# Patient Record
Sex: Female | Born: 1990 | Race: Black or African American | Hispanic: No | Marital: Single
Health system: Midwestern US, Community
[De-identification: ages and names within clinical notes are randomized; demographics above are authoritative.]

## PROBLEM LIST (undated history)

## (undated) ENCOUNTER — Ambulatory Visit (HOSPITAL_COMMUNITY): Payer: Self-pay | Source: Home / Self Care

## (undated) DIAGNOSIS — A749 Chlamydial infection, unspecified: Secondary | ICD-10-CM

## (undated) DIAGNOSIS — A549 Gonococcal infection, unspecified: Secondary | ICD-10-CM

## (undated) DIAGNOSIS — N7093 Salpingitis and oophoritis, unspecified: Secondary | ICD-10-CM

## (undated) DIAGNOSIS — Z6831 Body mass index (BMI) 31.0-31.9, adult: Secondary | ICD-10-CM

## (undated) HISTORY — PX: INDUCED ABORTION: SHX677

## (undated) HISTORY — DX: Body mass index (BMI) 31.0-31.9, adult: Z68.31

## (undated) HISTORY — PX: TONSILLECTOMY: SUR1361

---
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
## 2012-01-01 NOTE — ED Provider Notes (Signed)
Medical screening examination/treatment/procedure(s) were performed by non-physician practitioner and as supervising physician I was immediately available for consultation/collaboration.  Normal vital signs. No acute abdomen. No purulent vaginal discharge. Will return if worse  Donnetta Hutching, MD 01/01/12 1537

## 2012-01-25 ENCOUNTER — Emergency Department (HOSPITAL_COMMUNITY)
Admission: EM | Admit: 2012-01-25 | Discharge: 2012-01-26 | Disposition: A | Discharge status: Home / Self Care | Payer: Self-pay | Attending: Emergency Medicine | Admitting: Emergency Medicine

## 2012-01-25 ENCOUNTER — Encounter (HOSPITAL_COMMUNITY): Payer: Self-pay | Admitting: Emergency Medicine

## 2012-01-25 DIAGNOSIS — M545 Low back pain: Secondary | ICD-10-CM

## 2012-01-25 DIAGNOSIS — M549 Dorsalgia, unspecified: Secondary | ICD-10-CM | POA: Insufficient documentation

## 2012-01-25 DIAGNOSIS — G8929 Other chronic pain: Secondary | ICD-10-CM | POA: Insufficient documentation

## 2012-01-25 NOTE — ED Notes (Addendum)
Patient complaining of MS pain; patient states that she is out of her pain medications (ran out three days ago).  Complaining of back pain; patient ambulatory to triage.  Patient states that she does not have a doctor's appointment until August.  Patient states that there is a chance she may be pregnant.

## 2012-01-26 ENCOUNTER — Encounter (HOSPITAL_COMMUNITY): Payer: Self-pay | Admitting: *Deleted

## 2012-01-26 MED ORDER — NORGESTIM-ETH ESTRAD TRIPHASIC 0.18/0.215/0.25 MG-25 MCG PO TABS: 1.0000 | ORAL_TABLET | Freq: Every day | ORAL | Status: DC

## 2012-01-26 MED ORDER — CYCLOBENZAPRINE HCL 10 MG PO TABS: 10.0000 mg | ORAL_TABLET | Freq: Two times a day (BID) | ORAL | Status: AC | PRN

## 2012-01-26 MED ORDER — TRAMADOL HCL 50 MG PO TABS: 50.0000 mg | ORAL_TABLET | Freq: Four times a day (QID) | ORAL | Status: AC | PRN

## 2012-01-26 NOTE — ED Provider Notes (Signed)
Medical screening examination/treatment/procedure(s) were performed by non-physician practitioner and as supervising physician I was immediately available for consultation/collaboration.   Dajah Fischman, MD 01/26/12 0413 

## 2012-01-26 NOTE — ED Provider Notes (Signed)
History     CSN: [id number]  Arrival date & time 01/25/12  2315   First MD Initiated Contact with Patient 01/26/12 0100      Chief Complaint  Patient presents with  . Back Pain    (Consider location/radiation/quality/duration/timing/severity/associated sxs/prior treatment) HPI Comments: Patient here with acute onset of chronic lower back pain - states that she has a history of sciolosis and states that she has chronic pain related to this - denies any injury to exacerbate this - reports pain worse with movement - denies weakness, numbness, tingling, fever, chills, dysuria, hematuria.  She states she would also like to get on birth control  Patient is a 21 y.o. female presenting with back pain. The history is provided by the patient. No language interpreter was used.  Back Pain  This is a recurrent problem. The current episode started more than 2 days ago. The problem occurs constantly. The problem has not changed since onset.The pain is associated with no known injury. The pain is present in the lumbar spine. The quality of the pain is described as stabbing and shooting. The pain does not radiate. The pain is at a severity of 5/10. The pain is moderate. The symptoms are aggravated by bending, twisting and certain positions. The pain is the same all the time. Stiffness is present all day. Pertinent negatives include no chest pain, no fever, no numbness, no weight loss, no headaches, no abdominal pain, no abdominal swelling, no bowel incontinence, no perianal numbness, no bladder incontinence, no dysuria, no pelvic pain, no leg pain, no paresthesias, no paresis, no tingling and no weakness. She has tried nothing for the symptoms. The treatment provided no relief.    Past Medical History  Diagnosis Date  . Asthma   . Asthma   . Scoliosis   . Multiple sclerosis     Past Surgical History  Procedure Date  . No past surgeries   . Induced abortion     Family History  Problem Relation Age  of Onset  . Diabetes Mother   . Hypertension Mother   . Diabetes Father   . Hypertension Father   . Diabetes Sister   . Hypertension Sister     History  Substance Use Topics  . Smoking status: Never Smoker   . Smokeless tobacco: Never Used  . Alcohol Use: No    OB History    Grav Para Term Preterm Abortions TAB SAB Ect Mult Living   0 0 0 0 0 0 0 0 0 0       Review of Systems  Constitutional: Negative for fever and weight loss.  Cardiovascular: Negative for chest pain.  Gastrointestinal: Negative for abdominal pain and bowel incontinence.  Genitourinary: Negative for bladder incontinence, dysuria and pelvic pain.  Musculoskeletal: Positive for back pain.  Neurological: Negative for tingling, weakness, numbness, headaches and paresthesias.  All other systems reviewed and are negative.    Allergies  Fish allergy and Ibuprofen  Home Medications  No current outpatient prescriptions on file.  BP 107/69  Pulse 75  Temp(Src) 98 F (36.7 C) (Oral)  Resp 18  SpO2 98%  LMP 01/20/2012  Physical Exam  Nursing note and vitals reviewed. Constitutional: She is oriented to person, place, and time. She appears well-developed and well-nourished. No distress.  HENT:  Head: Normocephalic and atraumatic.  Right Ear: External ear normal.  Left Ear: External ear normal.  Nose: Nose normal.  Mouth/Throat: Oropharynx is clear and moist. No oropharyngeal exudate.  Eyes: Conjunctivae  are normal. Pupils are equal, round, and reactive to light. No scleral icterus.  Neck: Normal range of motion. Neck supple.  Cardiovascular: Normal rate, regular rhythm and normal heart sounds.  Exam reveals no gallop and no friction rub.   No murmur heard. Pulmonary/Chest: Effort normal and breath sounds normal. No respiratory distress. She has no wheezes. She has no rales. She exhibits no tenderness.  Abdominal: Soft. Bowel sounds are normal. She exhibits no distension. There is no tenderness.    Musculoskeletal:       Lumbar back: She exhibits tenderness and bony tenderness. She exhibits normal range of motion, no swelling, no edema, no deformity and no laceration.  Lymphadenopathy:    She has no cervical adenopathy.  Neurological: She is alert and oriented to person, place, and time. She has normal reflexes. No cranial nerve deficit. She exhibits normal muscle tone. Coordination normal.  Skin: Skin is warm and dry. No rash noted. No erythema. No pallor.  Psychiatric: She has a normal mood and affect. Her behavior is normal. Judgment and thought content normal.    ED Course  Procedures (including critical care time)   Labs Reviewed  POCT PREGNANCY, URINE   No results found.   Low back pain   MDM  Patient with a history of low back pain presents with worsening of pain - she is also requesting birth control pills as well - states that she is not pregnant - also initially reports that she has a history of MS which she now denies - normal neurological exam without red flags - will give short course of pain medication, and muscle relaxation.        Izola Price [place], [place] 01/26/12 [phone]

## 2012-01-26 NOTE — Discharge Instructions (Signed)
Back Exercises   Back exercises help treat and prevent back injuries. The goal of back exercises is to increase the strength of your abdominal and back muscles and the flexibility of your back. These exercises should be started when you no longer have back pain. Back exercises include:   Pelvic Tilt. Lie on your back with your knees bent. Tilt your pelvis until the lower part of your back is against the floor. Hold this position 5 to 10 sec and repeat 5 to 10 times.   Knee to Chest. Pull first 1 knee up against your chest and hold for 20 to 30 seconds, repeat this with the other knee, and then both knees. This may be done with the other leg straight or bent, whichever feels better.   Sit-Ups or Curl-Ups. Bend your knees 90 degrees. Start with tilting your pelvis, and do a partial, slow sit-up, lifting your trunk only 30 to 45 degrees off the floor. Take at least 2 to 3 seconds for each sit-up. Do not do sit-ups with your knees out straight. If partial sit-ups are difficult, simply do the above but with only tightening your abdominal muscles and holding it as directed.   Hip-Lift. Lie on your back with your knees flexed 90 degrees. Push down with your feet and shoulders as you raise your hips a couple inches off the floor; hold for 10 seconds, repeat 5 to 10 times.   Back arches. Lie on your stomach, propping yourself up on bent elbows. Slowly press on your hands, causing an arch in your low back. Repeat 3 to 5 times. Any initial stiffness and discomfort should lessen with repetition over time.   Shoulder-Lifts. Lie face down with arms beside your body. Keep hips and torso pressed to floor as you slowly lift your head and shoulders off the floor.   Do not overdo your exercises, especially in the beginning. Exercises may cause you some mild back discomfort which lasts for a few minutes; however, if the pain is more severe, or lasts for more than 15 minutes, do not continue exercises until you see your caregiver.  Improvement with exercise therapy for back problems is slow.   See your caregivers for assistance with developing a proper back exercise program.   Document Released: 09/16/2004 Document Revised: 07/29/2011 Document Reviewed: 08/09/2005   ExitCare® Patient Information ©2012 [employer].     Back Pain, Adult   Low back pain is very common. About 1 in 5 people have back pain. The cause of low back pain is rarely dangerous. The pain often gets better over time. About half of people with a sudden onset of back pain feel better in just 2 weeks. About 8 in 10 people feel better by 6 weeks.   CAUSES   Some common causes of back pain include:   Strain of the muscles or ligaments supporting the spine.   Wear and tear (degeneration) of the spinal discs.   Arthritis.   Direct injury to the back.   DIAGNOSIS   Most of the time, the direct cause of low back pain is not known. However, back pain can be treated effectively even when the exact cause of the pain is unknown. Answering your caregiver's questions about your overall health and symptoms is one of the most accurate ways to make sure the cause of your pain is not dangerous. If your caregiver needs more information, he or she may order lab work or imaging tests (X-rays or MRIs). However, even   if imaging tests show changes in your back, this usually does not require surgery.   HOME CARE INSTRUCTIONS   For many people, back pain returns. Since low back pain is rarely dangerous, it is often a condition that people can learn to manage on their own.   Remain active. It is stressful on the back to sit or stand in one place. Do not sit, drive, or stand in one place for more than 30 minutes at a time. Take short walks on level surfaces as soon as pain allows. Try to increase the length of time you walk each day.   Do not stay in bed. Resting more than 1 or 2 days can delay your recovery.   Do not avoid exercise or work. Your body is made to move. It is not dangerous to be active,  even though your back may hurt. Your back will likely heal faster if you return to being active before your pain is gone.   Pay attention to your body when you bend and lift. Many people have less discomfort when lifting if they bend their knees, keep the load close to their bodies, and avoid twisting. Often, the most comfortable positions are those that put less stress on your recovering back.   Find a comfortable position to sleep. Use a firm mattress and lie on your side with your knees slightly bent. If you lie on your back, put a pillow under your knees.   Only take over-the-counter or prescription medicines as directed by your caregiver. Over-the-counter medicines to reduce pain and inflammation are often the most helpful. Your caregiver may prescribe muscle relaxant drugs. These medicines help dull your pain so you can more quickly return to your normal activities and healthy exercise.   Put ice on the injured area.   Put ice in a plastic bag.   Place a towel between your skin and the bag.   Leave the ice on for 15 to 20 minutes, 3 to 4 times a day for the first 2 to 3 days. After that, ice and heat may be alternated to reduce pain and spasms.   Ask your caregiver about trying back exercises and gentle massage. This may be of some benefit.   Avoid feeling anxious or stressed. Stress increases muscle tension and can worsen back pain. It is important to recognize when you are anxious or stressed and learn ways to manage it. Exercise is a great option.   SEEK MEDICAL CARE IF:   You have pain that is not relieved with rest or medicine.   You have pain that does not improve in 1 week.   You have new symptoms.   You are generally not feeling well.   SEEK IMMEDIATE MEDICAL CARE IF:   You have pain that radiates from your back into your legs.   You develop new bowel or bladder control problems.   You have unusual weakness or numbness in your arms or legs.   You develop nausea or vomiting.   You develop abdominal  pain.   You feel faint.   Document Released: 08/09/2005 Document Revised: 07/29/2011 Document Reviewed: 12/28/2010   ExitCare® Patient Information ©2012 [employer].

## 2012-02-08 ENCOUNTER — Encounter (HOSPITAL_BASED_OUTPATIENT_CLINIC_OR_DEPARTMENT_OTHER): Payer: Self-pay | Admitting: Emergency Medicine

## 2012-02-08 ENCOUNTER — Emergency Department (HOSPITAL_BASED_OUTPATIENT_CLINIC_OR_DEPARTMENT_OTHER): Payer: No Typology Code available for payment source

## 2012-02-08 ENCOUNTER — Emergency Department (HOSPITAL_BASED_OUTPATIENT_CLINIC_OR_DEPARTMENT_OTHER)
Admission: EM | Admit: 2012-02-08 | Discharge: 2012-02-08 | Disposition: A | Discharge status: Home / Self Care | Payer: Medicaid Other | Attending: Emergency Medicine | Admitting: Emergency Medicine

## 2012-02-08 DIAGNOSIS — G35 Multiple sclerosis: Secondary | ICD-10-CM | POA: Insufficient documentation

## 2012-02-08 DIAGNOSIS — Z85841 Personal history of malignant neoplasm of brain: Secondary | ICD-10-CM | POA: Insufficient documentation

## 2012-02-08 DIAGNOSIS — R109 Unspecified abdominal pain: Secondary | ICD-10-CM | POA: Insufficient documentation

## 2012-02-08 DIAGNOSIS — M4120 Other idiopathic scoliosis, site unspecified: Secondary | ICD-10-CM | POA: Insufficient documentation

## 2012-02-08 DIAGNOSIS — IMO0002 Reserved for concepts with insufficient information to code with codable children: Secondary | ICD-10-CM
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
## 2012-02-08 NOTE — ED Notes (Signed)
Patient transported to X-ray 

## 2012-02-09 NOTE — ED Provider Notes (Signed)
Medical screening examination/treatment/procedure(s) were performed by non-physician practitioner and as supervising physician I was immediately available for consultation/collaboration.   Naren Benally, MD 02/09/12 2319 

## 2013-12-11 LAB — URINALYSIS W/ RFLX MICROSCOPIC
Blood: NEGATIVE
Glucose: NEGATIVE mg/dL
Leukocyte Esterase: NEGATIVE
Nitrites: NEGATIVE
Protein: 30 mg/dL — AB
Specific gravity: >1.03 — ABNORMAL HIGH (ref 1.003–1.030)
Urobilinogen: 0.2 EU/dL (ref 0.2–1.0)
pH (UA): 6 (ref 5.0–8.0)

## 2013-12-11 LAB — URINE MICROSCOPIC ONLY
RBC: 0 /hpf (ref 0–5)
WBC: 20 /hpf (ref 0–4)

## 2013-12-11 LAB — HCG QL SERUM: HCG, Ql.: NEGATIVE

## 2013-12-11 MED ORDER — TRIMETHOPRIM-SULFAMETHOXAZOLE 160 MG-800 MG TAB
160-800 mg | ORAL_TABLET | Freq: Two times a day (BID) | ORAL | Status: AC
Start: 2013-12-11 — End: 2013-12-18

## 2013-12-11 NOTE — ED Notes (Signed)
POC pregnancy test negative.

## 2013-12-11 NOTE — ED Notes (Signed)
I have reviewed discharge instructions with the patient.  The patient verbalized understanding.Patient armband removed and shredded

## 2013-12-11 NOTE — ED Notes (Signed)
Pt reports here for pregnancy test. C/o of nausea, lower abdominal pain.

## 2013-12-11 NOTE — ED Provider Notes (Signed)
HPI Comments: Karen Shaffer is a 23 y.o. Female with no PMHx who presents to the ED c/o abdominal pain. Pt states she has been having intermittent, lower abdominal pain for about a month. Pt notes also some nausea and fatigue. Pt has not had a period in two months and states she is concerned for pregnancy but did not take him pregnancy test. Pt denies any vomiting, dysuria, vaginal bleeding, vaginal discharge. No other complaints expressed at this time.    Patient is a 23 y.o. female presenting with abdominal pain. The history is provided by the patient. History limited by: no communication barrier.   Abdominal Pain   This is a new problem. The current episode started more than 2 days ago. The problem occurs constantly. The problem has not changed since onset.Associated symptoms include nausea. Pertinent negatives include no fever, no diarrhea, no vomiting, no dysuria, no frequency, no headaches, no arthralgias, no myalgias and no chest pain.        History reviewed. No pertinent past medical history.     History reviewed. No pertinent past surgical history.      History reviewed. No pertinent family history.     History     Social History   ??? Marital Status: SINGLE     Spouse Name: N/A     Number of Children: N/A   ??? Years of Education: N/A     Occupational History   ??? Not on file.     Social History Main Topics   ??? Smoking status: Not on file   ??? Smokeless tobacco: Not on file   ??? Alcohol Use: Not on file   ??? Drug Use: Not on file   ??? Sexual Activity: Not on file     Other Topics Concern   ??? Not on file     Social History Narrative   ??? No narrative on file        ALLERGIES: Motrin      Review of Systems   Constitutional: Positive for fatigue. Negative for fever, chills and diaphoresis.   HENT: Negative.  Negative for congestion, sore throat and trouble swallowing.    Eyes: Negative.  Negative for photophobia, pain and redness.   Respiratory: Negative.  Negative for cough, chest tightness, shortness of breath and  wheezing.    Cardiovascular: Negative.  Negative for chest pain and palpitations.   Gastrointestinal: Positive for nausea and abdominal pain. Negative for vomiting, diarrhea and blood in stool.   Endocrine: Negative.    Genitourinary: Positive for menstrual problem. Negative for dysuria, urgency, frequency, vaginal bleeding, vaginal discharge and difficulty urinating.   Musculoskeletal: Negative.  Negative for myalgias, arthralgias, neck pain and neck stiffness.   Skin: Negative.    Allergic/Immunologic: Negative.    Neurological: Negative.  Negative for dizziness, tremors, seizures, syncope, speech difficulty, light-headedness and headaches.   Psychiatric/Behavioral: Negative.  Negative for confusion. The patient is not nervous/anxious.    All other systems reviewed and are negative.      Filed Vitals:    12/11/13 1401   BP: 118/82   Pulse: 91   Temp: 98.3 ??F (36.8 ??C)   Resp: 18   Height: 5\' 2"  (1.575 m)   Weight: 65.772 kg (145 lb)   SpO2: 99%            Physical Exam   Constitutional: She is oriented to person, place, and time. She appears well-developed and well-nourished. No distress.   HENT:   Head: Normocephalic and atraumatic.  Eyes: EOM are normal. Pupils are equal, round, and reactive to light.   Neck: Normal range of motion. Neck supple.   Cardiovascular: Normal rate, regular rhythm and normal heart sounds.    Pulmonary/Chest: Effort normal and breath sounds normal. No respiratory distress. She has no wheezes. She has no rales.   Abdominal: Soft. Bowel sounds are normal. There is no tenderness.   Genitourinary:   NE   Musculoskeletal: Normal range of motion.   Neurological: She is alert and oriented to person, place, and time.   Skin: Skin is warm and dry.   Psychiatric: She has a normal mood and affect.   Nursing note and vitals reviewed.       MDM  Number of Diagnoses or Management Options  Diagnosis management comments: MDM:  Pt states she has no pain, no vaginal DC.  She is only interested in  findings out if she is pregnant or not.         Amount and/or Complexity of Data Reviewed  Clinical lab tests: reviewed and ordered    Risk of Complications, Morbidity, and/or Mortality  Presenting problems: minimal  Diagnostic procedures: minimal  Management options: minimal        Procedures    -------------------------------------------------------------------------------------------------------------------     EKG INTERPRETATIONS:  none    RADIOLOGY RESULTS:   No orders to display       LABORATORY RESULTS:  No results found for this or any previous visit (from the past 12 hour(s)).    ED Objective Order Summary:     Orders Placed This Encounter   ??? CHLAMYDIA/GC AMPLIFIED     Standing Status: Standing      Number of Occurrences: 1      Standing Expiration Date:      Order Specific Question:  Sample type     Answer:  Swab [241]     Order Specific Question:  Specimen source     Answer:  Vagina [280]   ??? WET PREP     Standing Status: Standing      Number of Occurrences: 1      Standing Expiration Date:    ??? URINALYSIS W/ RFLX MICROSCOPIC     Standing Status: Standing      Number of Occurrences: 1      Standing Expiration Date:    ??? PELVIC EXAM SET UP     Standing Status: Standing      Number of Occurrences: 1      Standing Expiration Date:    ??? POC URINE PREGNANCY TEST     Standing Status: Standing      Number of Occurrences: 1      Standing Expiration Date:        CONSULTATIONS:      PROGRESS NOTES:  2:00 PM:  Dr. Llana Aliment, DO answered the patient's questions regarding treatment.    DISPOSITION:  ED DIAGNOSIS & DISPOSITION INFORMATION  Diagnosis: No diagnosis found.      Disposition:   Discharged to Home.      Follow-up Information    None          Patient's Medications    No medications on file       SCRIBE ATTESTATION STATEMENT:  Documented by: Gino Garrabrant Qualia scribing for and in the presence of Llana Aliment, DO.    PROVIDER ATTESTATION STATEMENT:  I personally performed the services described in the  documentation, reviewed the documentation, as recorded by the scribe in my presence, and it  accurately and completely records my words and actions Llana Alimentlarence Clarke, DO.   Discharged to Home.       Diagnosis:   1. Pregnancy test negative    2. Urinary tract infection without hematuria, site unspecified          Disposition:  Discharged to home.      Follow-up Information    Follow up With Details Comments Contact Info    Aerian Dondra SpryK Joyner, NP Call today to arrange follow up appointment [address]  [place] [id number]  [phone]            Current Discharge Medication List      START taking these medications    Details   trimethoprim-sulfamethoxazole (SEPTRA DS) 160-800 mg per tablet Take 1 Tab by mouth two (2) times a day for 7 days.  Qty: 14 Tab, Refills: 0

## 2013-12-14 LAB — CULTURE, URINE
Culture result:: 30000
Culture result:: 60000
Culture: 60000

## 2018-01-23 ENCOUNTER — Encounter (HOSPITAL_COMMUNITY): Payer: Self-pay | Admitting: Emergency Medicine

## 2018-01-23 ENCOUNTER — Emergency Department (HOSPITAL_COMMUNITY)
Admission: EM | Admit: 2018-01-23 | Discharge: 2018-01-23 | Disposition: A | Discharge status: Home / Self Care | Payer: Self-pay | Attending: Emergency Medicine | Admitting: Emergency Medicine

## 2018-01-23 ENCOUNTER — Other Ambulatory Visit: Payer: Self-pay

## 2018-01-23 DIAGNOSIS — Z5321 Procedure and treatment not carried out due to patient leaving prior to being seen by health care provider: Secondary | ICD-10-CM | POA: Insufficient documentation

## 2018-01-23 DIAGNOSIS — R109 Unspecified abdominal pain: Secondary | ICD-10-CM | POA: Insufficient documentation

## 2018-01-23 LAB — CBC WITH DIFFERENTIAL/PLATELET
Abs Immature Granulocytes: 0.1 10*3/uL (ref 0.0–0.1)
BASOS ABS: 0.1 10*3/uL (ref 0.0–0.1)
Basophils Relative: 1 %
EOS PCT: 1 %
Eosinophils Absolute: 0.1 10*3/uL (ref 0.0–0.7)
HEMATOCRIT: 38.7 % (ref 36.0–46.0)
HEMOGLOBIN: 12.6 g/dL (ref 12.0–15.0)
IMMATURE GRANULOCYTES: 0 %
LYMPHS ABS: 3.4 10*3/uL (ref 0.7–4.0)
LYMPHS PCT: 25 %
MCH: 30.1 pg (ref 26.0–34.0)
MCHC: 32.6 g/dL (ref 30.0–36.0)
MCV: 92.6 fL (ref 78.0–100.0)
Monocytes Absolute: 1 10*3/uL (ref 0.1–1.0)
Monocytes Relative: 7 %
Neutro Abs: 9.2 10*3/uL — ABNORMAL HIGH (ref 1.7–7.7)
Neutrophils Relative %: 66 %
Platelets: 353 10*3/uL (ref 150–400)
RBC: 4.18 MIL/uL (ref 3.87–5.11)
RDW: 14.5 % (ref 11.5–15.5)
WBC: 13.8 10*3/uL — AB (ref 4.0–10.5)

## 2018-01-23 LAB — URINALYSIS, ROUTINE W REFLEX MICROSCOPIC
GLUCOSE, UA: NEGATIVE mg/dL
HGB URINE DIPSTICK: NEGATIVE
KETONES UR: NEGATIVE mg/dL
NITRITE: NEGATIVE
PROTEIN: 30 mg/dL — AB
Specific Gravity, Urine: 1.03 (ref 1.005–1.030)
pH: 5 (ref 5.0–8.0)

## 2018-01-23 LAB — COMPREHENSIVE METABOLIC PANEL
ALT: 9 U/L — ABNORMAL LOW (ref 14–54)
ANION GAP: 10 (ref 5–15)
AST: 15 U/L (ref 15–41)
Albumin: 3.8 g/dL (ref 3.5–5.0)
Alkaline Phosphatase: 54 U/L (ref 38–126)
BILIRUBIN TOTAL: 0.5 mg/dL (ref 0.3–1.2)
BUN: 9 mg/dL (ref 6–20)
CHLORIDE: 105 mmol/L (ref 101–111)
CO2: 27 mmol/L (ref 22–32)
Calcium: 9.1 mg/dL (ref 8.9–10.3)
Creatinine, Ser: 1.01 mg/dL — ABNORMAL HIGH (ref 0.44–1.00)
Glucose, Bld: 100 mg/dL — ABNORMAL HIGH (ref 65–99)
POTASSIUM: 3.6 mmol/L (ref 3.5–5.1)
Sodium: 142 mmol/L (ref 135–145)
TOTAL PROTEIN: 7.5 g/dL (ref 6.5–8.1)

## 2018-01-23 LAB — POC URINE PREG, ED: Preg Test, Ur: NEGATIVE

## 2018-01-23 MED ORDER — OXYCODONE HCL 5 MG PO TABS
5.0000 mg | ORAL_TABLET | Freq: Once | ORAL | Status: AC
  Administered 2018-01-23: 5 mg via ORAL
  Filled 2018-01-23: qty 1

## 2018-01-23 NOTE — ED Notes (Signed)
Called out to pt for vitals check, no response

## 2018-01-23 NOTE — ED Provider Notes (Addendum)
Patient placed in Quick Look pathway, seen and evaluated   Chief Complaint: RLQ abdominal pain  HPI:   Patient presents the ED with right lower quadrant abdominal pain that has been worsening for 1 month.  She states she was evaluated by [facility] last week and told she had an abscess of her right fallopian tube.  She states they recommended she be admitted for 1 week, however she left AMA because she did not have the time for admission.  She states the past 3 days pain has been worsening with nausea and vomiting.  Denies vaginal bleeding or discharge, urinary symptoms, fevers or chills.  LMP 2 months ago, requesting a pregnancy test. Per chart review, [facility] diagnosed with right tubo-ovarian abscess via U/S and CT.  Per documentation, she was recommended for admission though left AMA. There is no documentation of any further ED visits. When questioned about this, patient states she was indeed evaluated in April and then states she reported to [facility] last week for reevaluation and did not stay.  She states she was called today and recommended to report to the ED as soon as possible.  ROS: +abdominal pain, +N/V, -F/C Physical Exam:   Gen: No distress, well-appearing.  Neuro: Awake and Alert  Skin: Warm    Focused Exam: Afebrile.  Abdomen is soft.  Right lower quadrant tenderness on exam, no guarding.   Initiation of care has begun. The patient has been counseled on the process, plan, and necessity for staying for the completion/evaluation, and the remainder of the medical screening examination    Robinson, [place] N, PA-C 01/23/18 1336    Robinson, [place] N, PA-C 01/23/18 1341    Arby BarrettePfeiffer, Marcy, MD 02/01/18 [phone]

## 2018-01-23 NOTE — ED Notes (Signed)
Pt called in lobby for rounding, no response. Second call. RN informed.

## 2018-01-23 NOTE — ED Triage Notes (Addendum)
Patient to ED c/o R sided abdominal pain x 1 month, N/V, constipation x 3 days. [facility] and reports they wanted to admit her after finding a "6cm abscess on her R tube." Patient states she couldn't stay because she has to work. She reports she went to [facility] again last week, but nothing was done. Requesting a pregnancy test, LMP about 2 months ago.

## 2018-01-23 NOTE — ED Notes (Signed)
Third call in lobby, no response.

## 2018-01-28 ENCOUNTER — Inpatient Hospital Stay (HOSPITAL_COMMUNITY)
Admission: AD | Admit: 2018-01-28 | Discharge: 2018-01-28 | Discharge status: Against Medical Advice | Payer: Self-pay | Source: Ambulatory Visit | Attending: Obstetrics & Gynecology | Admitting: Obstetrics & Gynecology

## 2018-01-28 NOTE — MAU Note (Signed)
Pt presents with c/o abdominal pain that began on June 3rd.  Pt reports was [facility] @ [facility] and diagnosed with an abccess on her right tube in April.    [facility] because abdominal pain reoccurred.

## 2018-01-28 NOTE — Progress Notes (Signed)
Pt left AMA without being seen by AP or signing AMA form.   During triage, pt's significant other knocked on door to inform pt that their ride is on the way.  Pt stated "I mind as well not be seen, my rides on the way, I'm leaving.  I've been here for 3 hours and [facility]".  RN instructed she was going to be evaluated once triage completed.  Pt proceeded to walk out of triage area.

## 2018-01-28 NOTE — Progress Notes (Signed)
Pt called to seen in triage to be evaluated.  Not currently in lobby.

## 2018-01-28 NOTE — Progress Notes (Signed)
Pt called again to be evaluated, not seen in lobby.  Registration states pt's significant other came into lobby and asked if pt was ready for evaluation, informed that pt must  had been called but wasn't in lobby @ time.

## 2018-02-17 ENCOUNTER — Encounter (HOSPITAL_COMMUNITY): Payer: Self-pay | Admitting: Emergency Medicine

## 2018-02-17 ENCOUNTER — Emergency Department (HOSPITAL_COMMUNITY)
Admission: EM | Admit: 2018-02-17 | Discharge: 2018-02-17 | Disposition: A | Discharge status: Home / Self Care | Payer: Medicaid Other | Attending: Emergency Medicine | Admitting: Emergency Medicine

## 2018-02-17 DIAGNOSIS — Y999 Unspecified external cause status: Secondary | ICD-10-CM | POA: Insufficient documentation

## 2018-02-17 DIAGNOSIS — R21 Rash and other nonspecific skin eruption: Secondary | ICD-10-CM

## 2018-02-17 DIAGNOSIS — X12XXXA Contact with other hot fluids, initial encounter: Secondary | ICD-10-CM | POA: Insufficient documentation

## 2018-02-17 DIAGNOSIS — Y92000 Kitchen of unspecified non-institutional (private) residence as  the place of occurrence of the external cause: Secondary | ICD-10-CM | POA: Insufficient documentation

## 2018-02-17 DIAGNOSIS — T22111A Burn of first degree of right forearm, initial encounter: Secondary | ICD-10-CM

## 2018-02-17 DIAGNOSIS — Z85841 Personal history of malignant neoplasm of brain: Secondary | ICD-10-CM | POA: Insufficient documentation

## 2018-02-17 DIAGNOSIS — T310 Burns involving less than 10% of body surface: Secondary | ICD-10-CM | POA: Insufficient documentation

## 2018-02-17 DIAGNOSIS — Y93G3 Activity, cooking and baking: Secondary | ICD-10-CM | POA: Insufficient documentation

## 2018-02-17 DIAGNOSIS — J45909 Unspecified asthma, uncomplicated: Secondary | ICD-10-CM | POA: Insufficient documentation

## 2018-02-17 MED ORDER — PREDNISONE 10 MG (21) PO TBPK: ORAL_TABLET | Freq: Every day | ORAL | 0 refills | Status: DC

## 2018-02-17 MED ORDER — HYDROXYZINE HCL 25 MG PO TABS: 25.0000 mg | ORAL_TABLET | Freq: Four times a day (QID) | ORAL | 0 refills | Status: DC

## 2018-02-17 MED ORDER — SILVER SULFADIAZINE 1 % EX CREA
TOPICAL_CREAM | Freq: Once | CUTANEOUS | Status: AC
  Administered 2018-02-17: 17:00:00 via TOPICAL
  Filled 2018-02-17: qty 85
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
## 2018-02-17 NOTE — ED Notes (Signed)
Pt verbalized understanding discharge instructions and denies any further needs or questions at this time. VS stable, ambulatory and steady gait.   

## 2018-03-06 ENCOUNTER — Emergency Department (HOSPITAL_COMMUNITY): Payer: Self-pay

## 2018-03-06 ENCOUNTER — Emergency Department (HOSPITAL_COMMUNITY)
Admission: EM | Admit: 2018-03-06 | Discharge: 2018-03-06 | Disposition: A | Discharge status: Home / Self Care | Payer: Self-pay | Attending: Emergency Medicine | Admitting: Emergency Medicine

## 2018-03-06 ENCOUNTER — Encounter (HOSPITAL_COMMUNITY): Payer: Self-pay | Admitting: Emergency Medicine

## 2018-03-06 DIAGNOSIS — A549 Gonococcal infection, unspecified: Secondary | ICD-10-CM | POA: Insufficient documentation

## 2018-03-06 DIAGNOSIS — R1084 Generalized abdominal pain: Secondary | ICD-10-CM | POA: Insufficient documentation

## 2018-03-06 DIAGNOSIS — A5486 Gonococcal sepsis: Secondary | ICD-10-CM

## 2018-03-06 DIAGNOSIS — R112 Nausea with vomiting, unspecified: Secondary | ICD-10-CM | POA: Insufficient documentation

## 2018-03-06 DIAGNOSIS — Z85841 Personal history of malignant neoplasm of brain: Secondary | ICD-10-CM | POA: Insufficient documentation

## 2018-03-06 DIAGNOSIS — N7093 Salpingitis and oophoritis, unspecified: Secondary | ICD-10-CM | POA: Insufficient documentation

## 2018-03-06 DIAGNOSIS — J45909 Unspecified asthma, uncomplicated: Secondary | ICD-10-CM | POA: Insufficient documentation

## 2018-03-06 LAB — URINALYSIS, ROUTINE W REFLEX MICROSCOPIC
Bacteria, UA: NONE SEEN
Bilirubin Urine: NEGATIVE
GLUCOSE, UA: NEGATIVE mg/dL
Hgb urine dipstick: NEGATIVE
KETONES UR: 20 mg/dL — AB
Nitrite: NEGATIVE
PROTEIN: 30 mg/dL — AB
Specific Gravity, Urine: 1.03 (ref 1.005–1.030)
pH: 5 (ref 5.0–8.0)

## 2018-03-06 LAB — WET PREP, GENITAL
SPERM: NONE SEEN
Trich, Wet Prep: NONE SEEN
Yeast Wet Prep HPF POC: NONE SEEN

## 2018-03-06 LAB — COMPREHENSIVE METABOLIC PANEL
ALK PHOS: 60 U/L (ref 38–126)
ALT: 12 U/L (ref 0–44)
ANION GAP: 8 (ref 5–15)
AST: 20 U/L (ref 15–41)
Albumin: 4.1 g/dL (ref 3.5–5.0)
BUN: 9 mg/dL (ref 6–20)
CALCIUM: 9.5 mg/dL (ref 8.9–10.3)
CHLORIDE: 109 mmol/L (ref 98–111)
CO2: 26 mmol/L (ref 22–32)
Creatinine, Ser: 0.78 mg/dL (ref 0.44–1.00)
GFR calc non Af Amer: >60 mL/min (ref >60)
Glucose, Bld: 103 mg/dL — ABNORMAL HIGH (ref 70–99)
POTASSIUM: 3.6 mmol/L (ref 3.5–5.1)
SODIUM: 143 mmol/L (ref 135–145)
Total Bilirubin: 0.8 mg/dL (ref 0.3–1.2)
Total Protein: 8.4 g/dL — ABNORMAL HIGH (ref 6.5–8.1)

## 2018-03-06 LAB — CBC
HCT: 38.5 % (ref 36.0–46.0)
HEMOGLOBIN: 12.8 g/dL (ref 12.0–15.0)
MCH: 30.6 pg (ref 26.0–34.0)
MCHC: 33.2 g/dL (ref 30.0–36.0)
MCV: 92.1 fL (ref 78.0–100.0)
Platelets: 333 10*3/uL (ref 150–400)
RBC: 4.18 MIL/uL (ref 3.87–5.11)
RDW: 15.3 % (ref 11.5–15.5)
WBC: 12.8 10*3/uL — ABNORMAL HIGH (ref 4.0–10.5)

## 2018-03-06 LAB — I-STAT BETA HCG BLOOD, ED (MC, WL, AP ONLY): I-stat hCG, quantitative: <5 m[IU]/mL (ref <5)

## 2018-03-06 LAB — I-STAT CG4 LACTIC ACID, ED: Lactic Acid, Venous: 0.53 mmol/L (ref 0.5–1.9)

## 2018-03-06 LAB — LIPASE, BLOOD: LIPASE: 24 U/L (ref 11–51)

## 2018-03-06 MED ORDER — SODIUM CHLORIDE 0.9 % IV BOLUS
1000.0000 mL | Freq: Once | INTRAVENOUS | Status: AC
  Administered 2018-03-06: 1000 mL via INTRAVENOUS

## 2018-03-06 MED ORDER — NICOTINE 14 MG/24HR TD PT24: 14.0000 mg | MEDICATED_PATCH | Freq: Once | TRANSDERMAL | Status: DC

## 2018-03-06 MED ORDER — SODIUM CHLORIDE 0.9 % IV SOLN
2.0000 g | Freq: Once | INTRAVENOUS | Status: AC
  Administered 2018-03-06: 2 g via INTRAVENOUS
  Filled 2018-03-06: qty 2

## 2018-03-06 MED ORDER — MORPHINE SULFATE (PF) 4 MG/ML IV SOLN: 4.0000 mg | Freq: Once | INTRAVENOUS | Status: DC

## 2018-03-06 MED ORDER — MORPHINE SULFATE (PF) 4 MG/ML IV SOLN
4.0000 mg | Freq: Once | INTRAVENOUS | Status: AC
Start: 2018-03-06 — End: 2018-03-06
  Administered 2018-03-06: 4 mg via INTRAVENOUS
  Filled 2018-03-06: qty 1

## 2018-03-06 MED ORDER — SODIUM CHLORIDE 0.9 % IV SOLN
100.0000 mg | Freq: Once | INTRAVENOUS | Status: DC
  Filled 2018-03-06: qty 100

## 2018-03-06 MED ORDER — ONDANSETRON HCL 4 MG/2ML IJ SOLN
4.0000 mg | Freq: Once | INTRAMUSCULAR | Status: AC
  Administered 2018-03-06: 4 mg via INTRAVENOUS
  Filled 2018-03-06: qty 2

## 2018-03-06 NOTE — Discharge Instructions (Signed)
Your work-up today showed evidence of tubo-ovarian abscess as well as disseminated gonorrhea infection causing your rash.  This is the cause of your pain and symptoms.  We highly recommended admission and evaluation by the OB/GYN team as they might need to do a surgery however despite this you want to be discharged.  You are leaving AGAINST MEDICAL ADVICE.  We strongly discussed the chances that you could become more sick, infertile, or even die.  You understood these risks.  As we discussed, when your child care is taken care of, please go directly [facility] for evaluation.  If any symptoms change or worsen please call 911.

## 2018-03-06 NOTE — ED Notes (Signed)
Pt reports she is unable to void at this time. Pt updated on plan of care

## 2018-03-06 NOTE — ED Notes (Signed)
Patient left AMA prior to paperwork being printed. Dr. Rush Landmarkegeler aware. Patient stated to this nurse that she plans to [facility] to [facility], per Dr. Sharee Holsteregelers recommendation since she refuses to stay.

## 2018-03-06 NOTE — ED Notes (Signed)
Dr. Rush Landmarkegeler has repeatedly tried to convince patient to stay and has talked to the patient about the risks of leaving. Patient agreed to wait for antibiotics to finish and then requests to be disconnected so that she can leave.

## 2018-03-06 NOTE — ED Notes (Signed)
Pelvic cart at bedside. 

## 2018-03-06 NOTE — ED Provider Notes (Signed)
[facility]-EMERGENCY DEPT Provider Note   CSN: [id number] Arrival date & time: 03/06/18  1307     History   Chief Complaint Chief Complaint  Patient presents with  . Abdominal Pain    HPI Paula Lucas is a 27 y.o. female.  The history is provided by the patient and a significant other. No language interpreter was used.  Abdominal Pain   This is a recurrent problem. The current episode started more than 1 week ago. The problem occurs constantly. The problem has been rapidly worsening. The pain is associated with an unknown factor. The pain is located in the RLQ and suprapubic region. The quality of the pain is aching, sharp and cramping. The pain is at a severity of 9/10. The pain is severe. Associated symptoms include fever (chills), diarrhea, nausea, vomiting and constipation. Pertinent negatives include dysuria, frequency, hematuria and headaches. The symptoms are aggravated by palpation (walking). Nothing relieves the symptoms. Past workup includes CT scan and ultrasound (in april showing TOA).    Past Medical History:  Diagnosis Date  . Asthma   . Asthma   . Cancer of brain (HCC)   . Multiple sclerosis (HCC)   . Scoliosis     Patient Active Problem List   Diagnosis Date Noted  . Left ovarian cyst 04/16/2011    Past Surgical History:  Procedure Laterality Date  . INDUCED ABORTION    . NO PAST SURGERIES    . TONSILLECTOMY       OB History    Gravida  0   Para  0   Term  0   Preterm  0   AB  0   Living  0     SAB  0   TAB  0   Ectopic  0   Multiple  0   Live Births               Home Medications    Prior to Admission medications   Medication Sig Start Date End Date Taking? Authorizing Provider  hydrOXYzine (ATARAX/VISTARIL) 25 MG tablet Take 1 tablet (25 mg total) by mouth every 6 (six) hours. 02/17/18   Khatri, Hina, PA-C  predniSONE (STERAPRED UNI-PAK 21 TAB) 10 MG (21) TBPK tablet Take by mouth daily. Take 6  tabs by mouth daily  for 2 days, then 5 tabs for 2 days, then 4 tabs for 2 days, then 3 tabs for 2 days, 2 tabs for 2 days, then 1 tab by mouth daily for 2 days 02/17/18   Dietrich Pates, PA-C    Family History Family History  Problem Relation Age of Onset  . Diabetes Mother   . Hypertension Mother   . Diabetes Father   . Hypertension Father   . Diabetes Sister   . Hypertension Sister     Social History Social History   Tobacco Use  . Smoking status: Never Smoker  . Smokeless tobacco: Never Used  Substance Use Topics  . Alcohol use: No  . Drug use: No     Allergies   Fish allergy; Ibuprofen [ibuprofen]; and Mushroom extract complex   Review of Systems Review of Systems  Constitutional: Positive for chills, fatigue and fever (chills). Negative for diaphoresis.  HENT: Negative for congestion.   Respiratory: Negative for cough, chest tightness, shortness of breath and wheezing.   Cardiovascular: Negative for chest pain and palpitations.  Gastrointestinal: Positive for abdominal pain, constipation, diarrhea, nausea and vomiting. Negative for abdominal distention.  Genitourinary: Negative  for decreased urine volume, dysuria, flank pain, frequency, hematuria, pelvic pain, vaginal bleeding, vaginal discharge and vaginal pain.  Musculoskeletal: Positive for back pain. Negative for neck pain and neck stiffness.  Skin: Positive for rash. Negative for wound.  Neurological: Negative for light-headedness, numbness and headaches.  Psychiatric/Behavioral: Negative for agitation and confusion.  All other systems reviewed and are negative.    Physical Exam Updated Vital Signs BP 124/85 (BP Location: Left Arm)   Pulse (!) 107   Temp 98.8 F (37.1 C) (Oral)   Resp 18   Ht 5\' 1"  (1.549 m)   LMP 01/04/2018   SpO2 100%   BMI 32.12 kg/m   Physical Exam  Constitutional: She appears well-developed and well-nourished.  Non-toxic appearance. She does not appear ill. No distress.  HENT:   Head: Normocephalic and atraumatic.  Mouth/Throat: Oropharynx is clear and moist. No oropharyngeal exudate.  Eyes: Conjunctivae are normal.  Neck: Neck supple.  Cardiovascular: Regular rhythm and normal heart sounds. Tachycardia present.  No murmur heard. Pulmonary/Chest: Effort normal and breath sounds normal. No respiratory distress.  Abdominal: Soft. Normal appearance and bowel sounds are normal. There is tenderness in the right lower quadrant and suprapubic area. There is no rigidity, no rebound, no guarding and no CVA tenderness.    Genitourinary: Cervix exhibits motion tenderness and discharge. Right adnexum displays tenderness and fullness. Left adnexum displays tenderness. There is erythema and tenderness in the vagina. Vaginal discharge found.  Musculoskeletal: She exhibits no edema.  Neurological: She is alert.  Skin: Skin is warm and dry. Capillary refill takes less than 2 seconds. Rash noted. Rash is vesicular. No erythema.  Patient has disseminated rash on body that does appear to be vesicular and darkened places concerning for disseminated gonococcal infection.  Psychiatric: She has a normal mood and affect.  Nursing note and vitals reviewed.            ED Treatments / Results  Labs (all labs ordered are listed, but only abnormal results are displayed) Labs Reviewed  WET PREP, GENITAL - Abnormal; Notable for the following components:      Result Value   Clue Cells Wet Prep HPF POC PRESENT (*)    WBC, Wet Prep HPF POC MANY (*)    All other components within normal limits  COMPREHENSIVE METABOLIC PANEL - Abnormal; Notable for the following components:   Glucose, Bld 103 (*)    Total Protein 8.4 (*)    All other components within normal limits  CBC - Abnormal; Notable for the following components:   WBC 12.8 (*)    All other components within normal limits  URINALYSIS, ROUTINE W REFLEX MICROSCOPIC - Abnormal; Notable for the following components:   Color,  Urine AMBER (*)    APPearance CLOUDY (*)    Ketones, ur 20 (*)    Protein, ur 30 (*)    Leukocytes, UA MODERATE (*)    All other components within normal limits  CULTURE, BLOOD (ROUTINE X 2)  CULTURE, BLOOD (ROUTINE X 2)  LIPASE, BLOOD  I-STAT BETA HCG BLOOD, ED ([facility], [facility], [facility] ONLY)  I-STAT CG4 LACTIC ACID, ED  WET PREP  (BD AFFIRM) ([place])  GC/CHLAMYDIA PROBE AMP ([place]) NOT AT [facility]    EKG None  Radiology US Transvaginal Non-ob  Result Date: 03/06/2018 CLINICAL DATA:  Initial evaluation for acute right lower quadrant and pelvic pain. Recent untreated TOA. EXAM: TRANSABDOMINAL AND TRANSVAGINAL ULTRASOUND OF PELVIS DOPPLER ULTRASOUND OF OVARIES TECHNIQUE: Both transabdominal and transvaginal  ultrasound examinations of the pelvis were performed. Transabdominal technique was performed for global imaging of the pelvis including uterus, ovaries, adnexal regions, and pelvic cul-de-sac. It was necessary to proceed with endovaginal exam following the transabdominal exam to visualize the uterus, endometrium, and ovaries. Color and duplex Doppler ultrasound was utilized to evaluate blood flow to the ovaries. COMPARISON:  None. FINDINGS: Uterus Measurements: 8.1 x 3.2 x 4.1 cm. 8 x 5 x 6 mm subserosal fibroid present at the anterior uterine body. Endometrium Thickness: 6 mm.  No focal abnormality visualized. Right ovary Measurements: 7.6 x 6.4 x 6.2 cm. Complex cystic lesion measuring 5.5 x 4.2 x 4.8 cm with internal lace-like architecture, most characteristic of a hemorrhagic cyst. Adjacent heterogeneous isoechoic lesion within the right ovary measures 2.0 x 2.4 x 2.0 cm, indeterminate, but could reflect a complex cyst with internal proteinaceous material and/or hemorrhage. Left ovary Measurements: 4.0 x 2.0 x 2.6 cm. Normal appearance/no adnexal mass. Pulsed Doppler evaluation of both ovaries demonstrates normal low-resistance arterial and venous waveforms. Other findings Complex fluid present  within the left adnexal region with scattered internal septations and/or debris. IMPRESSION: 1. 5.5 cm complex right adnexal cystic lesion, most characteristic for a hemorrhagic cyst. Given size, a short interval follow-up ultrasound in 6-12 weeks is recommended to ensure resolution. 2. Complex free fluid with internal septations and/or debris within the left adnexa, suspected to reflect sequelae of recent infection/TOA given provided history. Correlation with symptomatology and laboratory values recommended, as persistent infection/PID would be difficult to exclude, and could be considered in the correct clinical setting. 3. No evidence for ovarian torsion. 4. 8 mm uterine fibroid. Electronically Signed   By: Rise Mu M.D.   On: 03/06/2018 20:40   US Pelvis Complete  Result Date: 03/06/2018 CLINICAL DATA:  Initial evaluation for acute right lower quadrant and pelvic pain. Recent untreated TOA. EXAM: TRANSABDOMINAL AND TRANSVAGINAL ULTRASOUND OF PELVIS DOPPLER ULTRASOUND OF OVARIES TECHNIQUE: Both transabdominal and transvaginal ultrasound examinations of the pelvis were performed. Transabdominal technique was performed for global imaging of the pelvis including uterus, ovaries, adnexal regions, and pelvic cul-de-sac. It was necessary to proceed with endovaginal exam following the transabdominal exam to visualize the uterus, endometrium, and ovaries. Color and duplex Doppler ultrasound was utilized to evaluate blood flow to the ovaries. COMPARISON:  None. FINDINGS: Uterus Measurements: 8.1 x 3.2 x 4.1 cm. 8 x 5 x 6 mm subserosal fibroid present at the anterior uterine body. Endometrium Thickness: 6 mm.  No focal abnormality visualized. Right ovary Measurements: 7.6 x 6.4 x 6.2 cm. Complex cystic lesion measuring 5.5 x 4.2 x 4.8 cm with internal lace-like architecture, most characteristic of a hemorrhagic cyst. Adjacent heterogeneous isoechoic lesion within the right ovary measures 2.0 x 2.4 x 2.0 cm,  indeterminate, but could reflect a complex cyst with internal proteinaceous material and/or hemorrhage. Left ovary Measurements: 4.0 x 2.0 x 2.6 cm. Normal appearance/no adnexal mass. Pulsed Doppler evaluation of both ovaries demonstrates normal low-resistance arterial and venous waveforms. Other findings Complex fluid present within the left adnexal region with scattered internal septations and/or debris. IMPRESSION: 1. 5.5 cm complex right adnexal cystic lesion, most characteristic for a hemorrhagic cyst. Given size, a short interval follow-up ultrasound in 6-12 weeks is recommended to ensure resolution. 2. Complex free fluid with internal septations and/or debris within the left adnexa, suspected to reflect sequelae of recent infection/TOA given provided history. Correlation with symptomatology and laboratory values recommended, as persistent infection/PID would be difficult to exclude, and could be  considered in the correct clinical setting. 3. No evidence for ovarian torsion. 4. 8 mm uterine fibroid. Electronically Signed   By: Rise MuBenjamin  McClintock M.D.   On: 03/06/2018 20:40   [place] Art/ven Flow Abd Pelv Doppler  Result Date: 03/06/2018 CLINICAL DATA:  Initial evaluation for acute right lower quadrant and pelvic pain. Recent untreated TOA. EXAM: TRANSABDOMINAL AND TRANSVAGINAL ULTRASOUND OF PELVIS DOPPLER ULTRASOUND OF OVARIES TECHNIQUE: Both transabdominal and transvaginal ultrasound examinations of the pelvis were performed. Transabdominal technique was performed for global imaging of the pelvis including uterus, ovaries, adnexal regions, and pelvic cul-de-sac. It was necessary to proceed with endovaginal exam following the transabdominal exam to visualize the uterus, endometrium, and ovaries. Color and duplex Doppler ultrasound was utilized to evaluate blood flow to the ovaries. COMPARISON:  None. FINDINGS: Uterus Measurements: 8.1 x 3.2 x 4.1 cm. 8 x 5 x 6 mm subserosal fibroid present at the anterior  uterine body. Endometrium Thickness: 6 mm.  No focal abnormality visualized. Right ovary Measurements: 7.6 x 6.4 x 6.2 cm. Complex cystic lesion measuring 5.5 x 4.2 x 4.8 cm with internal lace-like architecture, most characteristic of a hemorrhagic cyst. Adjacent heterogeneous isoechoic lesion within the right ovary measures 2.0 x 2.4 x 2.0 cm, indeterminate, but could reflect a complex cyst with internal proteinaceous material and/or hemorrhage. Left ovary Measurements: 4.0 x 2.0 x 2.6 cm. Normal appearance/no adnexal mass. Pulsed Doppler evaluation of both ovaries demonstrates normal low-resistance arterial and venous waveforms. Other findings Complex fluid present within the left adnexal region with scattered internal septations and/or debris. IMPRESSION: 1. 5.5 cm complex right adnexal cystic lesion, most characteristic for a hemorrhagic cyst. Given size, a short interval follow-up ultrasound in 6-12 weeks is recommended to ensure resolution. 2. Complex free fluid with internal septations and/or debris within the left adnexa, suspected to reflect sequelae of recent infection/TOA given provided history. Correlation with symptomatology and laboratory values recommended, as persistent infection/PID would be difficult to exclude, and could be considered in the correct clinical setting. 3. No evidence for ovarian torsion. 4. 8 mm uterine fibroid. Electronically Signed   By: Rise MuBenjamin  McClintock M.D.   On: 03/06/2018 20:40    Procedures Procedures (including critical care time)  Medications Ordered in ED Medications  doxycycline (VIBRAMYCIN) 100 mg in sodium chloride 0.9 % 250 mL IVPB (has no administration in time range)  morphine 4 MG/ML injection 4 mg (has no administration in time range)  nicotine (NICODERM CQ - dosed in mg/24 hours) patch 14 mg (has no administration in time range)  morphine 4 MG/ML injection 4 mg (4 mg Intravenous Given 03/06/18 1832)  ondansetron (ZOFRAN) injection 4 mg (4 mg  Intravenous Given 03/06/18 1832)  sodium chloride 0.9 % bolus 1,000 mL (0 mLs Intravenous Stopped 03/06/18 1933)  cefOXitin (MEFOXIN) 2 g in sodium chloride 0.9 % 100 mL IVPB (0 g Intravenous Stopped 03/06/18 2217)     Initial Impression / Assessment and Plan / ED Course  I have reviewed the triage vital signs and the nursing notes.  Pertinent labs & imaging results that were available during my care of the patient were reviewed by me and considered in my medical decision making (see chart for details).     Alethia BertholdJavonna S Lichtenberg is a 27 y.o. female with a past medical history significant for tubo-ovarian abscess in April of this year which was not fully treated, documentation of prior MS (which patient deines), brain tumor s/p surgery as a child, and asthma who presents to the  emergency department with right lower quadrant abdominal pain, nausea, and vomiting.  Patient reports that back in April she was having right lower abdominal pain and was found to have a right tubo-ovarian abscess that was partially 6 cm in size.  Patient was offered admission but left AGAINST MEDICAL ADVICE as she was diagnosed in [place] and could not stay at that time.  She says that she received the dose of antibiotics through an IV in the emergency department and then left without admission.  She says that her pain was intermittent since then but over the last 5 days it is worsened.  She reports it is now constant and goes across her lower abdomen primarily in the right lower quadrant.  She reports diffuse pain.  She also reports that over the last 2 weeks she is developed a rash over her body that does appear to now be on her right palm.  She reports that she was seen 2 weeks ago when the rash began and was started on prednisone and Atarax.  She reports the rash has been improving in some areas but is worsened in other areas.  Patient is family concerned about her abdominal pain which is associated with nausea and vomiting.  She  has not been eating over the last few days.  She also reports intermittent constipation and diarrhea.  She describes the pain is up to a 9 out of 10 severity and is cramping, swelling, and sharp pain.  She reports it is worse when she ambulates and to the pain radiates towards her back.  She denies any vaginal discharge or vaginal bleeding.  She denies any chest pain, shortness breath or cough.  She does report subjective chills.  Patient will have pelvic exam performed for further evaluation.  Next para clinically I am concerned about worsening TOA or spreading abscess in her abdomen.  Chart review shows that patient was positive for gonococcal infection and leaving AMA she did not fully treated.  I am concerned her rashes disseminated gonococcal infection.  See photos above for the rash.  After pelvic exam, she will have pelvic ultrasound and likely following CT scan to evaluate.  Pregnancy test was negative.  Patient was given pain medicine, nausea medicine, and fluids and she will remain n.p.o. Initially.  Anticipate admission.  6:59 PM Pelvic exam was performed and patient was found to have copious white discharge.  Patient had tenderness of the cervix and bilateral adnexa, right worse than left.  There did appear to be fullness on the right side.  Pelvic ultrasound be ordered to further evaluate.  Lactic acid was not elevated and urinalysis did not show nitrites or bacteria.  Metabolic panel was overall reassuring and CBC shows mild leukocytosis.    I am concerned about continued TOA with disseminated gonorrhea.  Anticipate admission after ultrasound.  9:38 PM Pelvic ultrasound showed evidence of concerning fluid pocket on both the right and the left.  Clinically I am concerned about TOA having spread bilaterally.  Pharmacy was called who recommended cefoxitin and IV doxycycline.  This was ordered.  OB/GYN was called and recommended transfer and direct admission to [facility] for  further management.  They will likely start with IV antibiotics and consider drainage if it is not improved tomorrow.  While awaiting transfer, patient was found to be smoking a cigarette in the exam room.  Patient was advised this was unacceptable and agreed to nicotine patch.  Patient to continue to raise concern about admission due  to children being at home however patient was advised that she could die if discharged again AGAINST MEDICAL ADVICE.  Patient agreed to at least go to [facility] and see the OB/GYN team for admission.    Patient will be transferred.  10:15 PM Patient reports that she is leaving his medical advice and will not be transferred over to see the OB/GYN team and get admitted.  She reports that she will go to the [facility] tomorrow after she gets her childcare taken care of to be admitted.  Patient will be leaving AGAINST MEDICAL ADVICE.  Dr. Earlene Plater with OB/GYN team was called and made aware that patient is not being admitted.  Dr. Earlene Plater will make the women's team aware so that when the patient comes in the morning and will help expedite her management and admission.   Final Clinical Impressions(s) / ED Diagnoses   Final diagnoses:  TOA (tubo-ovarian abscess)  DGI (disseminated gonococcal infection) (HCC)  Generalized abdominal pain  Non-intractable vomiting with nausea, unspecified vomiting type    ED Discharge Orders    None      Clinical Impression: 1. TOA (tubo-ovarian abscess)   2. DGI (disseminated gonococcal infection) (HCC)   3. Generalized abdominal pain   4. Non-intractable vomiting with nausea, unspecified vomiting type     Disposition: AGAINST MEDICAL ADVICE    Discharge Medication List as of 03/06/2018 10:25 PM      Follow Up: THE [facility] MATERNITY ADMISSIONS [address] [id number] mc [address] [place] [address] [phone] Go today      Tegeler, Canary Brim, MD 03/07/18  [phone]

## 2018-03-06 NOTE — ED Notes (Addendum)
Patient's IV keeps occluding, due to bending arm. Patient states she wants to leave due to not having childcare overnight.

## 2018-03-06 NOTE — ED Notes (Signed)
MD at bedside. 

## 2018-03-06 NOTE — ED Triage Notes (Signed)
Per [facility] pt from home for RLQ abd pain 5 days, with n/v but not at present. Pt had hx of problem with fallopian tube and had to be on antibiotics.

## 2018-03-06 NOTE — ED Notes (Signed)
Dr Rush Landmarkegeler walked into room and patient was smoking in her room. Patient is no longer smoking.

## 2018-03-07 LAB — GC/CHLAMYDIA PROBE AMP (~~LOC~~) NOT AT ARMC
CHLAMYDIA, DNA PROBE: POSITIVE — AB
Neisseria Gonorrhea: POSITIVE — AB

## 2018-04-01 ENCOUNTER — Emergency Department (HOSPITAL_COMMUNITY): Payer: Self-pay

## 2018-04-01 ENCOUNTER — Inpatient Hospital Stay (HOSPITAL_COMMUNITY)
Admission: EM | Admit: 2018-04-01 | Discharge: 2018-04-07 | DRG: 749 | Disposition: A | Discharge status: Home / Self Care | Payer: Self-pay | Attending: Family Medicine | Admitting: Family Medicine

## 2018-04-01 ENCOUNTER — Other Ambulatory Visit: Payer: Self-pay

## 2018-04-01 ENCOUNTER — Encounter (HOSPITAL_COMMUNITY): Payer: Self-pay

## 2018-04-01 DIAGNOSIS — B9689 Other specified bacterial agents as the cause of diseases classified elsewhere: Secondary | ICD-10-CM | POA: Diagnosis present

## 2018-04-01 DIAGNOSIS — A549 Gonococcal infection, unspecified: Secondary | ICD-10-CM | POA: Diagnosis present

## 2018-04-01 DIAGNOSIS — N7011 Chronic salpingitis: Secondary | ICD-10-CM | POA: Diagnosis present

## 2018-04-01 DIAGNOSIS — F17210 Nicotine dependence, cigarettes, uncomplicated: Secondary | ICD-10-CM | POA: Diagnosis present

## 2018-04-01 DIAGNOSIS — N730 Acute parametritis and pelvic cellulitis: Secondary | ICD-10-CM

## 2018-04-01 DIAGNOSIS — N7092 Oophoritis, unspecified: Secondary | ICD-10-CM

## 2018-04-01 DIAGNOSIS — N83201 Unspecified ovarian cyst, right side: Secondary | ICD-10-CM | POA: Diagnosis present

## 2018-04-01 DIAGNOSIS — N83202 Unspecified ovarian cyst, left side: Secondary | ICD-10-CM | POA: Diagnosis present

## 2018-04-01 DIAGNOSIS — N760 Acute vaginitis: Secondary | ICD-10-CM | POA: Diagnosis present

## 2018-04-01 DIAGNOSIS — N7093 Salpingitis and oophoritis, unspecified: Principal | ICD-10-CM | POA: Diagnosis present

## 2018-04-01 LAB — CBC WITH DIFFERENTIAL/PLATELET
BASOS ABS: 0 10*3/uL (ref 0.0–0.1)
BASOS PCT: 0 %
EOS PCT: 0 %
Eosinophils Absolute: 0 10*3/uL (ref 0.0–0.7)
HCT: 38.8 % (ref 36.0–46.0)
Hemoglobin: 12.8 g/dL (ref 12.0–15.0)
Lymphocytes Relative: 18 %
Lymphs Abs: 2.2 10*3/uL (ref 0.7–4.0)
MCH: 30 pg (ref 26.0–34.0)
MCHC: 33 g/dL (ref 30.0–36.0)
MCV: 91.1 fL (ref 78.0–100.0)
MONO ABS: 0.3 10*3/uL (ref 0.1–1.0)
Monocytes Relative: 3 %
Neutro Abs: 9.6 10*3/uL — ABNORMAL HIGH (ref 1.7–7.7)
Neutrophils Relative %: 79 %
PLATELETS: 295 10*3/uL (ref 150–400)
RBC: 4.26 MIL/uL (ref 3.87–5.11)
RDW: 15 % (ref 11.5–15.5)
WBC: 12.1 10*3/uL — ABNORMAL HIGH (ref 4.0–10.5)

## 2018-04-01 LAB — URINALYSIS, ROUTINE W REFLEX MICROSCOPIC
Bilirubin Urine: NEGATIVE
Glucose, UA: NEGATIVE mg/dL
Hgb urine dipstick: NEGATIVE
Ketones, ur: 20 mg/dL — AB
NITRITE: NEGATIVE
Protein, ur: NEGATIVE mg/dL
SPECIFIC GRAVITY, URINE: 1.024 (ref 1.005–1.030)
pH: 5 (ref 5.0–8.0)

## 2018-04-01 LAB — I-STAT CG4 LACTIC ACID, ED: LACTIC ACID, VENOUS: 0.77 mmol/L (ref 0.5–1.9)

## 2018-04-01 LAB — COMPREHENSIVE METABOLIC PANEL
ALBUMIN: 3.9 g/dL (ref 3.5–5.0)
ALT: 11 U/L (ref 0–44)
ANION GAP: 7 (ref 5–15)
AST: 19 U/L (ref 15–41)
Alkaline Phosphatase: 49 U/L (ref 38–126)
BUN: 8 mg/dL (ref 6–20)
CHLORIDE: 112 mmol/L — AB (ref 98–111)
CO2: 21 mmol/L — ABNORMAL LOW (ref 22–32)
Calcium: 8.9 mg/dL (ref 8.9–10.3)
Creatinine, Ser: 0.8 mg/dL (ref 0.44–1.00)
GFR calc Af Amer: >60 mL/min (ref >60)
Glucose, Bld: 100 mg/dL — ABNORMAL HIGH (ref 70–99)
POTASSIUM: 4 mmol/L (ref 3.5–5.1)
Sodium: 140 mmol/L (ref 135–145)
Total Bilirubin: 0.5 mg/dL (ref 0.3–1.2)
Total Protein: 7.8 g/dL (ref 6.5–8.1)

## 2018-04-01 LAB — WET PREP, GENITAL
SPERM: NONE SEEN
TRICH WET PREP: NONE SEEN
Yeast Wet Prep HPF POC: NONE SEEN

## 2018-04-01 LAB — I-STAT BETA HCG BLOOD, ED (MC, WL, AP ONLY)

## 2018-04-01 MED ORDER — KETOROLAC TROMETHAMINE 30 MG/ML IJ SOLN
30.0000 mg | Freq: Four times a day (QID) | INTRAMUSCULAR | Status: DC | PRN
  Administered 2018-04-02: 30 mg via INTRAVENOUS
  Filled 2018-04-01: qty 1

## 2018-04-01 MED ORDER — LACTATED RINGERS IV SOLN
INTRAVENOUS | Status: DC
  Administered 2018-04-01 – 2018-04-04 (×6): via INTRAVENOUS

## 2018-04-01 MED ORDER — IOPAMIDOL (ISOVUE-300) INJECTION 61%
INTRAVENOUS | Status: AC
  Filled 2018-04-01: qty 100

## 2018-04-01 MED ORDER — SODIUM CHLORIDE 0.9 % IV SOLN
100.0000 mg | Freq: Once | INTRAVENOUS | Status: AC
  Administered 2018-04-01: 100 mg via INTRAVENOUS
  Filled 2018-04-01: qty 100

## 2018-04-01 MED ORDER — OXYCODONE HCL 5 MG PO TABS
10.0000 mg | ORAL_TABLET | ORAL | Status: DC | PRN
  Administered 2018-04-01 – 2018-04-03 (×5): 10 mg via ORAL
  Filled 2018-04-01 (×5): qty 2

## 2018-04-01 MED ORDER — CEFOXITIN SODIUM 2 G IV SOLR
2.0000 g | Freq: Once | INTRAVENOUS | Status: DC
  Filled 2018-04-01: qty 2

## 2018-04-01 MED ORDER — OXYCODONE HCL 5 MG PO TABS
5.0000 mg | ORAL_TABLET | ORAL | Status: DC | PRN
  Administered 2018-04-04 – 2018-04-07 (×2): 5 mg via ORAL
  Filled 2018-04-01: qty 1

## 2018-04-01 MED ORDER — MORPHINE SULFATE (PF) 4 MG/ML IV SOLN
4.0000 mg | Freq: Once | INTRAVENOUS | Status: AC
  Administered 2018-04-01: 4 mg via INTRAVENOUS
  Filled 2018-04-01: qty 1

## 2018-04-01 MED ORDER — SODIUM CHLORIDE 0.9 % IV BOLUS
1000.0000 mL | Freq: Once | INTRAVENOUS | Status: AC
  Administered 2018-04-01: 1000 mL via INTRAVENOUS

## 2018-04-01 MED ORDER — METRONIDAZOLE IN NACL 5-0.79 MG/ML-% IV SOLN
500.0000 mg | Freq: Two times a day (BID) | INTRAVENOUS | Status: DC
  Administered 2018-04-02 – 2018-04-07 (×11): 500 mg via INTRAVENOUS
  Filled 2018-04-01 (×14): qty 100

## 2018-04-01 MED ORDER — CEFOTETAN DISODIUM 2 G IJ SOLR
2.0000 g | Freq: Two times a day (BID) | INTRAMUSCULAR | Status: DC
Start: 2018-04-01 — End: 2018-04-07
  Administered 2018-04-02 – 2018-04-07 (×11): 2 g via INTRAVENOUS
  Filled 2018-04-01 (×13): qty 2

## 2018-04-01 MED ORDER — ACETAMINOPHEN 325 MG PO TABS: 650.0000 mg | ORAL_TABLET | Freq: Four times a day (QID) | ORAL | Status: DC | PRN

## 2018-04-01 MED ORDER — SODIUM CHLORIDE 0.9 % IV SOLN
100.0000 mg | Freq: Two times a day (BID) | INTRAVENOUS | Status: DC
  Administered 2018-04-02 – 2018-04-07 (×10): 100 mg via INTRAVENOUS
  Filled 2018-04-01 (×13): qty 100

## 2018-04-01 MED ORDER — IOPAMIDOL (ISOVUE-300) INJECTION 61%
100.0000 mL | Freq: Once | INTRAVENOUS | Status: AC | PRN
  Administered 2018-04-01: 100 mL via INTRAVENOUS

## 2018-04-01 MED ORDER — ONDANSETRON HCL 4 MG/2ML IJ SOLN
4.0000 mg | Freq: Four times a day (QID) | INTRAMUSCULAR | Status: DC | PRN
  Administered 2018-04-01: 4 mg via INTRAVENOUS
  Filled 2018-04-01: qty 2

## 2018-04-01 MED ORDER — ONDANSETRON HCL 4 MG/2ML IJ SOLN
4.0000 mg | Freq: Once | INTRAMUSCULAR | Status: AC
  Administered 2018-04-01: 4 mg via INTRAVENOUS
  Filled 2018-04-01: qty 2

## 2018-04-01 MED ORDER — ACETAMINOPHEN 650 MG RE SUPP: 650.0000 mg | Freq: Four times a day (QID) | RECTAL | Status: DC | PRN

## 2018-04-01 MED ORDER — HYDROCODONE-ACETAMINOPHEN 5-325 MG PO TABS: 1.0000 | ORAL_TABLET | ORAL | Status: DC | PRN

## 2018-04-01 MED ORDER — NICOTINE 7 MG/24HR TD PT24
7.0000 mg | MEDICATED_PATCH | Freq: Every day | TRANSDERMAL | Status: DC
  Filled 2018-04-01 (×8): qty 1

## 2018-04-01 NOTE — ED Notes (Signed)
[facility] Provider at bedside. 

## 2018-04-01 NOTE — H&P (Signed)
History and Physical  Paula BertholdJavonna S Kassa JYN:[id number] DOB: 23-Jul-1991 DOA: 04/01/2018  Referring physician: Daphine DeutscherSofia Caccavale, PA-C, ED provider PCP: Patient, No Pcp Per  Outpatient Specialists: none  Patient Coming From: home  Chief Complaint: fever, pelvic pain, nausea, vomiting  HPI: Paula Lucas is a 27 y.o. female with a history of asthma. She was diagnosed with TOA on right side on 7/15. She was offered admission, but patient left AMA. She returns today with fever, worsening abdominal pain. Pain is worsening, radiating up her back bilaterally. Associated symptoms of nausea and vomiting and is unable to tolerate PO.  Emergency Department Course: CT scan showed several cystic lesions arising from both ovaries, the largest is on the left and measurings 5.5 x4.3 cm. WBC 12.  Review of Systems:   Pt denies any diarrhea, constipation, abdominal pain, shortness of breath, dyspnea on exertion, orthopnea, cough, wheezing, palpitations, headache, vision changes, lightheadedness, dizziness, melena, rectal bleeding.  Review of systems are otherwise negative  Past Medical History:  Diagnosis Date  . Asthma   . Asthma   . Cancer of brain (HCC)   . Multiple sclerosis (HCC)   . Scoliosis    Past Surgical History:  Procedure Laterality Date  . INDUCED ABORTION    . NO PAST SURGERIES    . TONSILLECTOMY     Social History:  reports that she has never smoked. She has never used smokeless tobacco. She reports that she does not drink alcohol or use drugs. Patient lives at home  Allergies  Allergen Reactions  . Fish Allergy Anaphylaxis    Severe allergic reaction to all seafoods  . Ibuprofen [Ibuprofen] Anaphylaxis  . Mushroom Extract Complex Anaphylaxis  . Tylenol [Acetaminophen] Anaphylaxis    Family History  Problem Relation Age of Onset  . Diabetes Mother   . Hypertension Mother   . Diabetes Father   . Hypertension Father   . Diabetes Sister   . Hypertension Sister        Prior to Admission medications   Medication Sig Start Date End Date Taking? Authorizing Provider  naproxen (NAPROSYN) 375 MG tablet Take 750 mg by mouth as needed for mild pain.   Yes [provider]  hydrOXYzine (ATARAX/VISTARIL) 25 MG tablet Take 1 tablet (25 mg total) by mouth every 6 (six) hours. Patient not taking: Reported on 04/01/2018 02/17/18   Dietrich PatesKhatri, Hina, PA-C  predniSONE (STERAPRED UNI-PAK 21 TAB) 10 MG (21) TBPK tablet Take by mouth daily. Take 6 tabs by mouth daily  for 2 days, then 5 tabs for 2 days, then 4 tabs for 2 days, then 3 tabs for 2 days, 2 tabs for 2 days, then 1 tab by mouth daily for 2 days Patient not taking: Reported on 04/01/2018 02/17/18   Dietrich PatesKhatri, Hina, PA-C    Physical Exam: BP 103/77   Pulse 100   Temp (!) 100.8 F (38.2 C) (Rectal)   Resp 17   Ht 5\' 1"  (1.549 m)   LMP 02/06/2018 Comment: negative HCG blood test 04-01-2018  SpO2 100%   BMI 32.12 kg/m   . General: young black female. Awake and alert and oriented x3. No acute cardiopulmonary distress.  Marland Kitchen. HEENT: Normocephalic atraumatic.  Right and left ears normal in appearance.  Pupils equal, round, reactive to light. Extraocular muscles are intact. Sclerae anicteric and noninjected.  Moist mucosal membranes. No mucosal lesions.  . Neck: Neck supple without lymphadenopathy. No carotid bruits. No masses palpated.  . Cardiovascular: Regular rate with normal S1-S2  sounds. No murmurs, rubs, gallops auscultated. No JVD.  Marland Kitchen Respiratory: Good respiratory effort with no wheezes, rales, rhonchi. Lungs clear to auscultation bilaterally.  No accessory muscle use. . Abdomen: Soft, Tender throughout, but worse in RLQ. Rebound tenderness, but nondistended. No masses or hepatosplenomegaly  . Skin: No rashes, lesions, or ulcerations.  Dry, warm to touch. 2+ dorsalis pedis and radial pulses. . Musculoskeletal: No calf or leg pain. All major joints not erythematous nontender.  No upper or lower joint deformation.   Good ROM.  No contractures  . Psychiatric: Intact judgment and insight. Pleasant and cooperative. . Neurologic: No focal neurological deficits. Strength is 5/5 and symmetric in upper and lower extremities.  Cranial nerves II through XII are grossly intact.           Labs on Admission: I have personally reviewed following labs and imaging studies  CBC: Recent Labs  Lab 04/01/18 1424  WBC 12.1*  NEUTROABS 9.6*  HGB 12.8  HCT 38.8  MCV 91.1  PLT 295   Basic Metabolic Panel: Recent Labs  Lab 04/01/18 1424  NA 140  K 4.0  CL 112*  CO2 21*  GLUCOSE 100*  BUN 8  CREATININE 0.80  CALCIUM 8.9   GFR: CrCl cannot be calculated (Unknown ideal weight.). Liver Function Tests: Recent Labs  Lab 04/01/18 1424  AST 19  ALT 11  ALKPHOS 49  BILITOT 0.5  PROT 7.8  ALBUMIN 3.9   No results for input(s): LIPASE, AMYLASE in the last 168 hours. No results for input(s): AMMONIA in the last 168 hours. Coagulation Profile: No results for input(s): INR, PROTIME in the last 168 hours. Cardiac Enzymes: No results for input(s): CKTOTAL, CKMB, CKMBINDEX, TROPONINI in the last 168 hours. BNP (last 3 results) No results for input(s): PROBNP in the last 8760 hours. HbA1C: No results for input(s): HGBA1C in the last 72 hours. CBG: No results for input(s): GLUCAP in the last 168 hours. Lipid Profile: No results for input(s): CHOL, HDL, LDLCALC, TRIG, CHOLHDL, LDLDIRECT in the last 72 hours. Thyroid Function Tests: No results for input(s): TSH, T4TOTAL, FREET4, T3FREE, THYROIDAB in the last 72 hours. Anemia Panel: No results for input(s): VITAMINB12, FOLATE, FERRITIN, TIBC, IRON, RETICCTPCT in the last 72 hours. Urine analysis:    Component Value Date/Time   COLORURINE AMBER (A) 04/01/2018 1600   APPEARANCEUR CLOUDY (A) 04/01/2018 1600   LABSPEC 1.024 04/01/2018 1600   PHURINE 5.0 04/01/2018 1600   GLUCOSEU NEGATIVE 04/01/2018 1600   HGBUR NEGATIVE 04/01/2018 1600   BILIRUBINUR  NEGATIVE 04/01/2018 1600   KETONESUR 20 (A) 04/01/2018 1600   PROTEINUR NEGATIVE 04/01/2018 1600   UROBILINOGEN 1.0 09/04/2011 0210   NITRITE NEGATIVE 04/01/2018 1600   LEUKOCYTESUR LARGE (A) 04/01/2018 1600   Sepsis Labs: @LABRCNTIP (procalcitonin:4,lacticidven:4) ) Recent Results (from the past 240 hour(s))  Wet prep, genital     Status: Abnormal   Collection Time: 04/01/18  3:12 PM  Result Value Ref Range Status   Yeast Wet Prep HPF POC NONE SEEN NONE SEEN Final   Trich, Wet Prep NONE SEEN NONE SEEN Final   Clue Cells Wet Prep HPF POC PRESENT (A) NONE SEEN Final   WBC, Wet Prep HPF POC FEW (A) NONE SEEN Final   Sperm NONE SEEN  Final    Comment: Performed at [facility], [address], [place], [place] [address]     Radiological Exams on Admission: Ct Abdomen Pelvis W Contrast  Result Date: 04/01/2018 CLINICAL DATA:  Low stomach  pain for several weeks, worsening now. Nausea and vomiting. EXAM: CT ABDOMEN AND PELVIS WITH CONTRAST TECHNIQUE: Multidetector CT imaging of the abdomen and pelvis was performed using the standard protocol following bolus administration of intravenous contrast. CONTRAST:  ISOVUE-300 IOPAMIDOL (ISOVUE-300) INJECTION 61% COMPARISON:  Patient's prior CT from May 11, 2012 is not available on PACs for comparison, pelvic ultrasound March 06, 2018 FINDINGS: Lower chest: No acute abnormality. Hepatobiliary: Mild diffuse low density of the liver is identified. No focal liver lesion is noted. The gallbladder is normal. The biliary tree is normal. Pancreas: Unremarkable. No pancreatic ductal dilatation or surrounding inflammatory changes. Spleen: Normal in size without focal abnormality. Adrenals/Urinary Tract: The adrenal glands are normal. Small nonobstructing stones are identified in both kidneys. There is no hydronephrosis bilaterally. The bladder is partially decompressed limiting evaluation, but no gross abnormality is identified.  Stomach/Bowel: Stomach is within normal limits. The appendix is not definitely seen. No evidence of bowel wall thickening, distention, or inflammatory changes. Vascular/Lymphatic: No significant vascular findings are present. No enlarged abdominal or pelvic lymph nodes. Reproductive: There are several cystic lesions with surrounding fluid rising from both ovaries. The largest lesion is in the left ovary measuring 5.5 x 4.3 cm. There is question cystic lesions extending beyond the right and left ovary particularly in the anterior right lower quadrant on image 64 and 63 with surrounding inflammatory change. Moderate free fluid is identified in the pelvis. The uterus is unremarkable. Other: None. Musculoskeletal: No acute or significant osseous findings. IMPRESSION: There are several cystic lesions with surrounding fluid rising from both ovaries. The largest lesion is in the left ovary measuring 5.5 x 4.3 cm. There is question cystic lesions extending beyond the right and left ovary particularly in the anterior right lower quadrant on image 64 and 63 with surrounding inflammatory change. Moderate free fluid is identified in the pelvis. These are nonspecific, but PID with tubo-ovarian abscesses are not excluded. Electronically Signed   By: Sherian Rein M.D.   On: 04/01/2018 15:38     Assessment/Plan: Principal Problem:   TOA (tubo-ovarian abscess) Active Problems:   BV (bacterial vaginosis)    This patient was discussed with the ED physician, including pertinent vitals, physical exam findings, labs, and imaging.  We also discussed care given by the ED provider.  1. TOA a. Doxy, Cefotetan b. Transvaginal US  c. CBC in AM d. IVF e. Toradol and vicodin for pain 2. BV a. Flagyl 500mg  BID  DVT prophylaxis: scds Consultants: none Code Status: full Family Communication:   Disposition Plan:    Levie Heritage, DO [facility] Pager [phone]  If 7PM-7AM, please contact  night-coverage [email or web address] Password [email or web address]

## 2018-04-01 NOTE — ED Notes (Signed)
JESSICA RN AWARE OF PENDING ANTIBIOTICS AND BOLUS LR PENDING

## 2018-04-01 NOTE — ED Provider Notes (Signed)
[facility]-EMERGENCY DEPT Provider Note   CSN: [id number] Arrival date & time: 04/01/18  1327     History   Chief Complaint Chief Complaint  Patient presents with  . Abdominal Pain  . Nausea  . Emesis    HPI Paula Lucas is a 27 y.o. female presenting for evaluation of abdominal pain, nausea, vomiting.  Patient presenting for evaluation of worsened bilateral lower abdominal pain, nausea, vomiting.  Been unable to tolerate p.o. for the past week.  Patient states she has been seen multiple times for GU infections, but this time she is ready to be admitted.  Patient states she has not been taking anything for pain.  She reports pain has extended up into her abdomen and back bilaterally.  She reports chills, has not checked her temperature.  Denies chest pain or SOB.  Reports pelvic pain with urination and bowel movements, but no dysuria, hematuria, or urinary frequency.  No diarrhea or constipation.  She has no other medical problems, takes no medications daily.  She is sexually active with one female partner who is asymptomatic.  She does not use contraception or condoms.  She has not been taking antibiotics at home.  Additional history obtained from chart review.  Shows multiple visits per patient has declined admission for TOA's.  Most recent was July 15 in which TOA had spread to the left side.  Pharmacy was consulted at that time, patient was started on IV cefoxitin and doxycycline, was not discharged with abx, as [facility]  HPI  Past Medical History:  Diagnosis Date  . Asthma   . Asthma     Patient Active Problem List   Diagnosis Date Noted  . TOA (tubo-ovarian abscess) 04/01/2018  . BV (bacterial vaginosis) 04/01/2018  . Left ovarian cyst 04/16/2011    Past Surgical History:  Procedure Laterality Date  . INDUCED ABORTION    . NO PAST SURGERIES    . TONSILLECTOMY       OB History    Gravida  1   Para  0   Term  0   Preterm  0   AB  1   Living  0     SAB  0   TAB  1   Ectopic  0   Multiple  0   Live Births               Home Medications    Prior to Admission medications   Medication Sig Start Date End Date Taking? Authorizing Provider  naproxen (NAPROSYN) 375 MG tablet Take 750 mg by mouth as needed for mild pain.   Yes [provider]  hydrOXYzine (ATARAX/VISTARIL) 25 MG tablet Take 1 tablet (25 mg total) by mouth every 6 (six) hours. Patient not taking: Reported on 04/01/2018 02/17/18   Dietrich PatesKhatri, Hina, PA-C  predniSONE (STERAPRED UNI-PAK 21 TAB) 10 MG (21) TBPK tablet Take by mouth daily. Take 6 tabs by mouth daily  for 2 days, then 5 tabs for 2 days, then 4 tabs for 2 days, then 3 tabs for 2 days, 2 tabs for 2 days, then 1 tab by mouth daily for 2 days Patient not taking: Reported on 04/01/2018 02/17/18   Dietrich PatesKhatri, Hina, PA-C    Family History Family History  Problem Relation Age of Onset  . Diabetes Mother   . Hypertension Mother   . Diabetes Father   . Hypertension Father   .  Diabetes Sister   . Hypertension Sister     Social History Social History   Tobacco Use  . Smoking status: Never Smoker  . Smokeless tobacco: Never Used  Substance Use Topics  . Alcohol use: No  . Drug use: Yes    Types: Marijuana    Comment: THC GUMMIES LAST MONTH     Allergies   Fish allergy; Ibuprofen [ibuprofen]; Mushroom extract complex; and Tylenol [acetaminophen]   Review of Systems Review of Systems  Constitutional: Positive for chills.  Gastrointestinal: Positive for abdominal pain, nausea and vomiting.  Genitourinary: Positive for pelvic pain and vaginal discharge.  Musculoskeletal: Positive for back pain.  All other systems reviewed and are negative.    Physical Exam Updated Vital Signs BP (!) 119/47   Pulse 98   Temp 100.3 F (37.9 C)   Resp 18   Ht 5\' 1"  (1.549 m)   LMP 02/06/2018 Comment: negative HCG blood test  04-01-2018  SpO2 99%   BMI 32.12 kg/m   Physical Exam  Constitutional: She is oriented to person, place, and time. She appears well-developed and well-nourished.  Patient appears comfortable due to pain.  Unable to lie on her back due to pain.  HENT:  Head: Normocephalic and atraumatic.  MM moist  Eyes: Pupils are equal, round, and reactive to light. Conjunctivae and EOM are normal.  Neck: Normal range of motion. Neck supple.  Cardiovascular: Regular rhythm and intact distal pulses.  Tachycardic around 105  Pulmonary/Chest: Effort normal and breath sounds normal. No respiratory distress. She has no wheezes.  Abdominal: Soft. Bowel sounds are normal. She exhibits no distension and no mass. There is tenderness. There is no rebound and no guarding.  Generalized tenderness palpation of the abdomen.  Worse of the suprapubic abdomen and bilateral lower quadrants  Genitourinary: Rectum normal and vagina normal. Pelvic exam was performed with patient supine. There is no rash, tenderness or lesion on the right labia. There is no rash, tenderness or lesion on the left labia. Cervix exhibits motion tenderness and discharge. Right adnexum displays no tenderness. Left adnexum displays no tenderness.  Genitourinary Comments: Chaperone present.  Significant pain with pelvic exam.  White discharge noted.  CMT and bilateral adnexal tenderness.  Musculoskeletal: Normal range of motion.  Neurological: She is alert and oriented to person, place, and time.  Skin: Skin is warm and dry.  Psychiatric: She has a normal mood and affect.  Nursing note and vitals reviewed.    ED Treatments / Results  Labs (all labs ordered are listed, but only abnormal results are displayed) Labs Reviewed  WET PREP, GENITAL - Abnormal; Notable for the following components:      Result Value   Clue Cells Wet Prep HPF POC PRESENT (*)    WBC, Wet Prep HPF POC FEW (*)    All other components within normal limits  CBC WITH  DIFFERENTIAL/PLATELET - Abnormal; Notable for the following components:   WBC 12.1 (*)    Neutro Abs 9.6 (*)    All other components within normal limits  COMPREHENSIVE METABOLIC PANEL - Abnormal; Notable for the following components:   Chloride 112 (*)    CO2 21 (*)    Glucose, Bld 100 (*)    All other components within normal limits  URINALYSIS, ROUTINE W REFLEX MICROSCOPIC - Abnormal; Notable for the following components:   Color, Urine AMBER (*)    APPearance CLOUDY (*)    Ketones, ur 20 (*)    Leukocytes, UA LARGE (*)  Bacteria, UA FEW (*)    All other components within normal limits  CULTURE, BLOOD (ROUTINE X 2)  CULTURE, BLOOD (ROUTINE X 2)  RPR  HIV ANTIBODY (ROUTINE TESTING)  I-STAT BETA HCG BLOOD, ED ([facility], [facility], [facility] ONLY)  I-STAT CG4 LACTIC ACID, ED  GC/CHLAMYDIA PROBE AMP ([place]) NOT AT [facility]    EKG None  Radiology Ct Abdomen Pelvis W Contrast  Result Date: 04/01/2018 CLINICAL DATA:  Low stomach pain for several weeks, worsening now. Nausea and vomiting. EXAM: CT ABDOMEN AND PELVIS WITH CONTRAST TECHNIQUE: Multidetector CT imaging of the abdomen and pelvis was performed using the standard protocol following bolus administration of intravenous contrast. CONTRAST:  ISOVUE-300 IOPAMIDOL (ISOVUE-300) INJECTION 61% COMPARISON:  Patient's prior CT from May 11, 2012 is not available on PACs for comparison, pelvic ultrasound March 06, 2018 FINDINGS: Lower chest: No acute abnormality. Hepatobiliary: Mild diffuse low density of the liver is identified. No focal liver lesion is noted. The gallbladder is normal. The biliary tree is normal. Pancreas: Unremarkable. No pancreatic ductal dilatation or surrounding inflammatory changes. Spleen: Normal in size without focal abnormality. Adrenals/Urinary Tract: The adrenal glands are normal. Small nonobstructing stones are identified in both kidneys. There is no hydronephrosis bilaterally. The bladder is partially decompressed  limiting evaluation, but no gross abnormality is identified. Stomach/Bowel: Stomach is within normal limits. The appendix is not definitely seen. No evidence of bowel wall thickening, distention, or inflammatory changes. Vascular/Lymphatic: No significant vascular findings are present. No enlarged abdominal or pelvic lymph nodes. Reproductive: There are several cystic lesions with surrounding fluid rising from both ovaries. The largest lesion is in the left ovary measuring 5.5 x 4.3 cm. There is question cystic lesions extending beyond the right and left ovary particularly in the anterior right lower quadrant on image 64 and 63 with surrounding inflammatory change. Moderate free fluid is identified in the pelvis. The uterus is unremarkable. Other: None. Musculoskeletal: No acute or significant osseous findings. IMPRESSION: There are several cystic lesions with surrounding fluid rising from both ovaries. The largest lesion is in the left ovary measuring 5.5 x 4.3 cm. There is question cystic lesions extending beyond the right and left ovary particularly in the anterior right lower quadrant on image 64 and 63 with surrounding inflammatory change. Moderate free fluid is identified in the pelvis. These are nonspecific, but PID with tubo-ovarian abscesses are not excluded. Electronically Signed   By: Sherian Rein M.D.   On: 04/01/2018 15:38    Procedures .Critical Care Performed by: Alveria Apley, PA-C Authorized by: Alveria Apley, PA-C   Critical care provider statement:    Critical care time (minutes):  45   Critical care time was exclusive of:  Separately billable procedures and treating other patients and teaching time   Critical care was necessary to treat or prevent imminent or life-threatening deterioration of the following conditions:  Sepsis   Critical care was time spent personally by me on the following activities:  Blood draw for specimens, discussions with consultants, development of  treatment plan with patient or surrogate, evaluation of patient's response to treatment, examination of patient, obtaining history from patient or surrogate, ordering and performing treatments and interventions, ordering and review of laboratory studies, ordering and review of radiographic studies, pulse oximetry, re-evaluation of patient's condition and review of old charts   I assumed direction of critical care for this patient from another provider in my specialty: no   Comments:     Patient meets SIRS criteria, tachycardic and  febrile.  Antibiotics and fluids given.   (including critical care time)  Medications Ordered in ED Medications  iopamidol (ISOVUE-300) 61 % injection (has no administration in time range)  doxycycline (VIBRAMYCIN) 100 mg in sodium chloride 0.9 % 250 mL IVPB (100 mg Intravenous New Bag/Given 04/01/18 1608)  cefoTEtan (CEFOTAN) 2 g in sodium chloride 0.9 % 100 mL IVPB (has no administration in time range)  doxycycline (VIBRAMYCIN) 100 mg in sodium chloride 0.9 % 250 mL IVPB (has no administration in time range)  lactated ringers infusion (has no administration in time range)  ondansetron (ZOFRAN) injection 4 mg (has no administration in time range)  metroNIDAZOLE (FLAGYL) IVPB 500 mg (has no administration in time range)  morphine 4 MG/ML injection 4 mg (4 mg Intravenous Given 04/01/18 1436)  ondansetron (ZOFRAN) injection 4 mg (4 mg Intravenous Given 04/01/18 1434)  iopamidol (ISOVUE-300) 61 % injection 100 mL (100 mLs Intravenous Contrast Given 04/01/18 1515)  sodium chloride 0.9 % bolus 1,000 mL (1,000 mLs Intravenous New Bag/Given 04/01/18 1637)     Initial Impression / Assessment and Plan / ED Course  I have reviewed the triage vital signs and the nursing notes.  Pertinent labs & imaging results that were available during my care of the patient were reviewed by me and considered in my medical decision making (see chart for details).     Pt presenting for  evaluation of worsening pelvic pain with associated nausea and vomiting.  Physical exam concerning, patient is febrile and tachycardic.  Has been seen multiple times with TOAs, has not received treatment for this.  Patient states she is willing to be admitted this time.  Will start work-up, obtain pelvic exam, treat symptoms, and reassess.  Discussed with attending, Dr. Madilyn Hook evaluated patient.  As patient has generalized abdominal pain as well, will obtain CT scan due to concerns for possible peritonitis.  Labs show patient with mild leukocytosis of 12.  Otherwise labs reassuring, lactic negative.  Urine pending.  Pelvic exam concerning, discharge noted, CMT, and adnexal tenderness.  Concern for PID and continued TOAs.  CT pending. HR and sxs have improved.   CT shows multiple cystic lesions of the ovaries, largest 5.5 x 4.8 cm.  Scan is consistent with PID and TOA's.  Doxycycline and cefoxitin charted, as that was what was recommended and patient was in the ER last time.  Will call OB/GYN for further management and admission.  Discussed with Dr. Adrian Blackwater from OB/GYN, who recommends switching's cefoxitin to cefotetan.  Re[facility]ends [facility] to [facility].  Discussed with patient, who is agreeable to plan.   Final Clinical Impressions(s) / ED Diagnoses   Final diagnoses:  TOA (tubo-ovarian abscess)  PID (acute pelvic inflammatory disease)  BV (bacterial vaginosis)    ED Discharge Orders    None       Alveria Apley, PA-C 04/01/18 1752    Tilden Fossa, MD 04/05/18 1430

## 2018-04-01 NOTE — ED Notes (Signed)
ED Provider at bedside. EDP REES 

## 2018-04-01 NOTE — ED Triage Notes (Signed)
Per [facility] Pt c/o lower stomach pain started for several weeks greater last night. Ovarian cyst with abscess. Scheduled surgery however did not have due to "no ride" . Pt here with lower stomach pain N/V without diarrhea. Denies fever.

## 2018-04-01 NOTE — ED Notes (Signed)
Bed: WA24 Expected date:  Expected time:  Means of arrival:  Comments: 26 yo abd pain 

## 2018-04-01 NOTE — ED Notes (Signed)
ED TO INPATIENT HANDOFF REPORT  Name/Age/Gender Paula Lucas 27 y.o. female  Code Status   Home/SNF/Other Home  Chief Complaint abd pain, nausea  Level of Care/Admitting Diagnosis ED Disposition    ED Disposition Condition [facility] Area: [place] [[id number]]  Level of Care: Med-Surg [16]  Diagnosis: TOA (tubo-ovarian abscess) [169678]  Admitting Physician: Truett Mainland [[id number]]  Attending Physician: Truett Mainland [[id number]]  Estimated length of stay: past midnight tomorrow  Certification:: I certify this patient will need inpatient services for at least 2 midnights  PT Class (Do Not Modify): Inpatient [101]  PT Acc Code (Do Not Modify): Private [1]       Medical History Past Medical History:  Diagnosis Date  . Asthma   . Asthma   . Cancer of brain ([place])   . Multiple sclerosis ([place])   . Scoliosis     Allergies Allergies  Allergen Reactions  . Fish Allergy Anaphylaxis    Severe allergic reaction to all seafoods  . Ibuprofen [Ibuprofen] Anaphylaxis  . Mushroom Extract Complex Anaphylaxis  . Tylenol [Acetaminophen] Anaphylaxis    IV Location/Drains/Wounds Patient Lines/Drains/Airways Status   Active Line/Drains/Airways    Name:   Placement date:   Placement time:   Site:   Days:   Peripheral IV 04/01/18 Right Antecubital   04/01/18    1415    Antecubital   less than 1   External Urinary Catheter   04/01/18    1413    -   less than 1          Labs/Imaging Results for orders placed or performed during the hospital encounter of 04/01/18 (from the past 48 hour(s))  I-Stat Beta hCG blood, ED ([facility], [facility], [facility] only)     Status: None   Collection Time: 04/01/18  2:21 PM  Result Value Ref Range   I-stat hCG, quantitative <5.0 <5 mIU/mL   Comment 3            Comment:   GEST. AGE      CONC.  (mIU/mL)   <=1 WEEK        5 - 50     2 WEEKS       50 - 500     3 WEEKS       100 - 10,000     4 WEEKS     1,000 - 30,000         FEMALE AND NON-PREGNANT FEMALE:     LESS THAN 5 mIU/mL   CBC with Differential     Status: Abnormal   Collection Time: 04/01/18  2:24 PM  Result Value Ref Range   WBC 12.1 (H) 4.0 - 10.5 K/uL   RBC 4.26 3.87 - 5.11 MIL/uL   Hemoglobin 12.8 12.0 - 15.0 g/dL   HCT 38.8 36.0 - 46.0 %   MCV 91.1 78.0 - 100.0 fL   MCH 30.0 26.0 - 34.0 pg   MCHC 33.0 30.0 - 36.0 g/dL   RDW 15.0 11.5 - 15.5 %   Platelets 295 150 - 400 K/uL   Neutrophils Relative % 79 %   Neutro Abs 9.6 (H) 1.7 - 7.7 K/uL   Lymphocytes Relative 18 %   Lymphs Abs 2.2 0.7 - 4.0 K/uL   Monocytes Relative 3 %   Monocytes Absolute 0.3 0.1 - 1.0 K/uL   Eosinophils Relative 0 %   Eosinophils Absolute 0.0 0.0 - 0.7 K/uL   Basophils Relative  0 %   Basophils Absolute 0.0 0.0 - 0.1 K/uL    Comment: Performed at [facility], [place] [address] [place], [place] [address]  Comprehensive metabolic panel     Status: Abnormal   Collection Time: 04/01/18  2:24 PM  Result Value Ref Range   Sodium 140 135 - 145 mmol/L   Potassium 4.0 3.5 - 5.1 mmol/L   Chloride 112 (H) 98 - 111 mmol/L   CO2 21 (L) 22 - 32 mmol/L   Glucose, Bld 100 (H) 70 - 99 mg/dL   BUN 8 6 - 20 mg/dL   Creatinine, Ser 0.80 0.44 - 1.00 mg/dL   Calcium 8.9 8.9 - 10.3 mg/dL   Total Protein 7.8 6.5 - 8.1 g/dL   Albumin 3.9 3.5 - 5.0 g/dL   AST 19 15 - 41 U/L   ALT 11 0 - 44 U/L   Alkaline Phosphatase 49 38 - 126 U/L   Total Bilirubin 0.5 0.3 - 1.2 mg/dL   GFR calc non Af Amer >60 >60 mL/min   GFR calc Af Amer >60 >60 mL/min    Comment: (NOTE) The eGFR has been calculated using the CKD EPI equation. This calculation has not been validated in all clinical situations. eGFR's persistently <60 mL/min signify possible Chronic Kidney Disease.    Anion gap 7 5 - 15    Comment: Performed at [facility], [place] [address] [place], [place] [address]  I-Stat CG4 Lactic Acid, ED     Status: None   Collection Time: 04/01/18  2:24 PM   Result Value Ref Range   Lactic Acid, Venous 0.77 0.5 - 1.9 mmol/L  Wet prep, genital     Status: Abnormal   Collection Time: 04/01/18  3:12 PM  Result Value Ref Range   Yeast Wet Prep HPF POC NONE SEEN NONE SEEN   Trich, Wet Prep NONE SEEN NONE SEEN   Clue Cells Wet Prep HPF POC PRESENT (A) NONE SEEN   WBC, Wet Prep HPF POC FEW (A) NONE SEEN   Sperm NONE SEEN     Comment: Performed at [facility], [place] [address], [place], [place] [address]  Urinalysis, Routine w reflex microscopic     Status: Abnormal   Collection Time: 04/01/18  4:00 PM  Result Value Ref Range   Color, Urine AMBER (A) YELLOW    Comment: BIOCHEMICALS MAY BE AFFECTED BY COLOR   APPearance CLOUDY (A) CLEAR   Specific Gravity, Urine 1.024 1.005 - 1.030   pH 5.0 5.0 - 8.0   Glucose, UA NEGATIVE NEGATIVE mg/dL   Hgb urine dipstick NEGATIVE NEGATIVE   Bilirubin Urine NEGATIVE NEGATIVE   Ketones, ur 20 (A) NEGATIVE mg/dL   Protein, ur NEGATIVE NEGATIVE mg/dL   Nitrite NEGATIVE NEGATIVE   Leukocytes, UA LARGE (A) NEGATIVE   RBC / HPF 11-20 0 - 5 RBC/hpf   WBC, UA 11-20 0 - 5 WBC/hpf   Bacteria, UA FEW (A) NONE SEEN   Squamous Epithelial / LPF 11-20 0 - 5    Comment: Performed at [facility], [place] [address] [place], [place] [id number]   Ct Abdomen Pelvis W Contrast  Result Date: 04/01/2018 CLINICAL DATA:  Low stomach pain for several weeks, worsening now. Nausea and vomiting. EXAM: CT ABDOMEN AND PELVIS WITH CONTRAST TECHNIQUE: Multidetector CT imaging of the abdomen and pelvis was performed using the standard protocol following bolus administration of intravenous contrast. CONTRAST:  160m ISOVUE-300 IOPAMIDOL (ISOVUE-300) INJECTION  61% COMPARISON:  Patient's prior CT from May 11, 2012 is not available on PACs for comparison, pelvic ultrasound March 06, 2018 FINDINGS: Lower chest: No acute abnormality. Hepatobiliary: Mild diffuse low density of the liver is identified. No  focal liver lesion is noted. The gallbladder is normal. The biliary tree is normal. Pancreas: Unremarkable. No pancreatic ductal dilatation or surrounding inflammatory changes. Spleen: Normal in size without focal abnormality. Adrenals/Urinary Tract: The adrenal glands are normal. Small nonobstructing stones are identified in both kidneys. There is no hydronephrosis bilaterally. The bladder is partially decompressed limiting evaluation, but no gross abnormality is identified. Stomach/Bowel: Stomach is within normal limits. The appendix is not definitely seen. No evidence of bowel wall thickening, distention, or inflammatory changes. Vascular/Lymphatic: No significant vascular findings are present. No enlarged abdominal or pelvic lymph nodes. Reproductive: There are several cystic lesions with surrounding fluid rising from both ovaries. The largest lesion is in the left ovary measuring 5.5 x 4.3 cm. There is question cystic lesions extending beyond the right and left ovary particularly in the anterior right lower quadrant on image 64 and 63 with surrounding inflammatory change. Moderate free fluid is identified in the pelvis. The uterus is unremarkable. Other: None. Musculoskeletal: No acute or significant osseous findings. IMPRESSION: There are several cystic lesions with surrounding fluid rising from both ovaries. The largest lesion is in the left ovary measuring 5.5 x 4.3 cm. There is question cystic lesions extending beyond the right and left ovary particularly in the anterior right lower quadrant on image 64 and 63 with surrounding inflammatory change. Moderate free fluid is identified in the pelvis. These are nonspecific, but PID with tubo-ovarian abscesses are not excluded. Electronically Signed   By: Abelardo Diesel M.D.   On: 04/01/2018 15:38    Pending Labs Unresulted Labs (From admission, onward)    Start     Ordered   04/01/18 1400  RPR  (STI Exposure)  Once,   STAT     04/01/18 1359   04/01/18 1400   HIV antibody  (STI Exposure)  Once,   STAT     04/01/18 1359   04/01/18 1359  Blood culture (routine x 2)  BLOOD CULTURE X 2,   STAT     04/01/18 1359          Vitals/Pain Today's Vitals   04/01/18 1422 04/01/18 1447 04/01/18 1500 04/01/18 1530  BP:   (!) 113/59 103/77  Pulse:   100 100  Resp:   16 17  Temp: (!) 100.8 F (38.2 C)     TempSrc: Rectal     SpO2:   99% 100%  Height:      PainSc:  8       Isolation Precautions No active isolations  Medications Medications  iopamidol (ISOVUE-300) 61 % injection (has no administration in time range)  doxycycline (VIBRAMYCIN) 100 mg in sodium chloride 0.9 % 250 mL IVPB (100 mg Intravenous New Bag/Given 04/01/18 1608)  sodium chloride 0.9 % bolus 1,000 mL (has no administration in time range)  cefoTEtan (CEFOTAN) 2 g in sodium chloride 0.9 % 100 mL IVPB (has no administration in time range)  doxycycline (VIBRAMYCIN) 100 mg in sodium chloride 0.9 % 250 mL IVPB (has no administration in time range)  lactated ringers infusion (has no administration in time range)  ondansetron (ZOFRAN) injection 4 mg (has no administration in time range)  metroNIDAZOLE (FLAGYL) IVPB 500 mg (has no administration in time range)  morphine 4 MG/ML injection 4 mg (4  mg Intravenous Given 04/01/18 1436)  ondansetron (ZOFRAN) injection 4 mg (4 mg Intravenous Given 04/01/18 1434)  iopamidol (ISOVUE-300) 61 % injection 100 mL (100 mLs Intravenous Contrast Given 04/01/18 1515)    Mobility walks

## 2018-04-02 ENCOUNTER — Inpatient Hospital Stay (HOSPITAL_COMMUNITY): Payer: Self-pay

## 2018-04-02 DIAGNOSIS — A549 Gonococcal infection, unspecified: Secondary | ICD-10-CM | POA: Clinically undetermined

## 2018-04-02 LAB — CBC
HEMATOCRIT: 29.6 % — AB (ref 36.0–46.0)
Hemoglobin: 10.2 g/dL — ABNORMAL LOW (ref 12.0–15.0)
MCH: 31 pg (ref 26.0–34.0)
MCHC: 34.5 g/dL (ref 30.0–36.0)
MCV: 90 fL (ref 78.0–100.0)
Platelets: 220 10*3/uL (ref 150–400)
RBC: 3.29 MIL/uL — ABNORMAL LOW (ref 3.87–5.11)
RDW: 15.1 % (ref 11.5–15.5)
WBC: 10.2 10*3/uL (ref 4.0–10.5)

## 2018-04-02 LAB — HIV ANTIBODY (ROUTINE TESTING W REFLEX): HIV Screen 4th Generation wRfx: NONREACTIVE

## 2018-04-02 LAB — RPR: RPR: NONREACTIVE

## 2018-04-02 NOTE — Progress Notes (Signed)
Subjective: Patient reports some nausea. Eating fruit, but can't keep much else down. Pain is improved some, but hurts worse with walking. Pain mostly on right side now  Last fever: last night at 1640: 100.3. Getting scheduled Toradol.  Objective: I have reviewed patient's vital signs, medications, labs and microbiology.  BP 102/69 (BP Location: Left Arm)   Pulse 92   Temp 99.1 F (37.3 C) (Oral)   Resp 18   Ht 5\' 1"  (1.549 m)   Wt 62.6 kg   LMP 02/06/2018 Comment: negative HCG blood test 04-01-2018  SpO2 99%   BMI 26.07 kg/m   General: alert, cooperative and no distress Resp: clear to auscultation bilaterally Cardio: regular rate and rhythm, S1, S2 normal, no murmur, click, rub or gallop GI: soft, non-tender; bowel sounds normal; no masses,  no organomegaly Extremities: extremities normal, atraumatic, no cyanosis or edema, Homans sign is negative, no sign of DVT and no edema, redness or tenderness in the calves or thighs  CBC Latest Ref Rng & Units 04/02/2018 04/01/2018 03/06/2018  WBC 4.0 - 10.5 K/uL 10.2 12.1(H) 12.8(H)  Hemoglobin 12.0 - 15.0 g/dL 10.2(L) 12.8 12.8  Hematocrit 36.0 - 46.0 % 29.6(L) 38.8 38.5  Platelets 150 - 400 K/uL 220 295 333    Assessment/Plan: 1. TOA  Appears to be improving.  Continue Cefotetan and doxy (Day 1 of 14)  If continues to improve, may not need drainage.  2. BV  Flagyl   LOS: 1 day    Levie HeritageJacob J Stinson 04/02/2018, 6:53 AM

## 2018-04-02 NOTE — Progress Notes (Addendum)
Subjective: Patient reports continued pain..    Objective: BP 111/68 (BP Location: Left Arm)   Pulse (!) 115   Temp 100.2 F (37.9 C) (Oral)   Resp 20   Ht 5\' 1"  (1.549 m)   Wt 62.6 kg   LMP 02/06/2018 Comment: negative HCG blood test 04-01-2018  SpO2 98%   BMI 26.07 kg/m   I have reviewed patient's labs and radiology results. Pt had + GC and + Chamydia in July, at time of prior dx pid, no Proof of cure has been documented.  CBC    Component Value Date/Time   WBC 10.2 04/02/2018 0557   RBC 3.29 (L) 04/02/2018 0557   HGB 10.2 (L) 04/02/2018 0557   HCT 29.6 (L) 04/02/2018 0557   PLT 220 04/02/2018 0557   MCV 90.0 04/02/2018 0557   MCH 31.0 04/02/2018 0557   MCHC 34.5 04/02/2018 0557   RDW 15.1 04/02/2018 0557   LYMPHSABS 2.2 04/01/2018 1424   MONOABS 0.3 04/01/2018 1424   EOSABS 0.0 04/01/2018 1424   BASOSABS 0.0 04/01/2018 1424  IMPRESSION: 1. The right fallopian tube is filled with fluid, more prominent in the interval. This could represent worsening hydrosalpinx or pyosalpinx. 2. The complex mass in the right ovary with a lacy appearance is smaller in the interval. This was previously suggested represent a hemorrhagic cyst and the interval decrease in size suggests improvement. Recommend continued attention on follow-up. 3. New mildly complicated cystic mass in the left ovary measuring 5.3 x 5.3 x 5.0 cm. This could represent a dominant mildly complicated follicle. Given the mild complexity, I suspect a tubo-ovarian abscess in the left ovary is less likely but not completely excluded.  These results will be called to the ordering clinician or representative by the Radiologist Assistant, and communication documented in the PACS or zVision Dashboard.   Electronically Signed   By: Gerome Samavid  Williams III M.D   On: 04/02/2018 18:22  Physical Examination: General appearance - alert, well appearing, and in no distress and oriented to person, place, and time Abdomen  - tenderness noted in all 4 quadrants, worst in rlq, equivocal rebound in both LQ.  Extremities - peripheral pulses normal, no pedal edema, no clubbing or cyanosis, Homan's sign negative bilaterally  1  Right hydrosalpinx  2. Right ovarian hemorrhagic cyst. 3. Complicated left ovarian cyst, felt benign, cannot completely ru/e out ovarian abscess. 4 recent Hx +GC and +CHL  Assessment/Plan:  Plan: continue current antibiotic regimen          Discuss with IR in a.m. Re: possible drainage of right hydrosalpinx.          Find out if partner was tx'd, and if POC done at any other facility    LOS: 1 day    Paula BurrowJohn V Lolly Lucas 04/02/2018, 6:51 PM

## 2018-04-02 NOTE — Progress Notes (Signed)
CSW received consult for patient due to her having difficulty obtaining medications, transportation, and food insecurity. Patient only has [employer] for insurance which does not cover the costs of the medications that were prescribed to her during one of her multiple visits to [facility]. Patient reports that she has 10 ovarian and tubal cysts that she was diagnosed with five months ago at [facility]. Patient reports that nine of them are the size of a golf ball and the last being the size of an apple. Patient reports being told she needs surgery, but was scared to get it done but is now ready due to the extreme risk of rupture and becoming septic. Patient reports that she does not have a stable residence and she goes from one family member's house to the next. Patient expressed desire to speak with someone prior to having surgery in regards to obtaining a safe place for her to go at discharge for recovery. CSW made sure patient understood there are no guarantees for assistance with placement at discharge and it would all depend on what her doctor's recommendations were. Patient is agreeable to go to a shelter, but just desires help getting there. Patient reports to CSW that she was told she would have surgery sometime this week. CSW provided patient and her boyfriend with four bus passes and two books of resources for food pantries and free meals in the local community. CSW informed patient that CSW would make contact with a Nurse Case Manager for assistance with medications at the time of discharge. CSW spoke with patient's RN, Shanda BumpsJessica and RN stated she would place consult for RN CM. Patient and her boyfriend very pleasant during engagement and expressed gratitude for CSW assistance.  CSW left detailed voicemail for Pasty ArchLannette Gaines, RN CM and sent her patient's information via secure email for follow up this week.  Edwin Dadaarol Pradeep Beaubrun, MSW, LCSW-A Clinical Social  Worker [facility] [phone]

## 2018-04-03 LAB — GC/CHLAMYDIA PROBE AMP (~~LOC~~) NOT AT ARMC
Chlamydia: POSITIVE — AB
NEISSERIA GONORRHEA: POSITIVE — AB

## 2018-04-03 LAB — PROTIME-INR
INR: 1.09
PROTHROMBIN TIME: 14 s (ref 11.4–15.2)

## 2018-04-03 MED ORDER — OXYCODONE HCL 5 MG PO TABS
10.0000 mg | ORAL_TABLET | Freq: Four times a day (QID) | ORAL | Status: DC | PRN
  Administered 2018-04-04 – 2018-04-05 (×4): 10 mg via ORAL
  Filled 2018-04-03 (×7): qty 2

## 2018-04-03 MED ORDER — POLYETHYLENE GLYCOL 3350 17 G PO PACK
17.0000 g | PACK | Freq: Every day | ORAL | Status: DC
  Administered 2018-04-06: 17 g via ORAL
  Filled 2018-04-03 (×2): qty 1

## 2018-04-03 MED ORDER — ZOLPIDEM TARTRATE 5 MG PO TABS
5.0000 mg | ORAL_TABLET | Freq: Every evening | ORAL | Status: DC | PRN
  Administered 2018-04-04: 5 mg via ORAL
  Filled 2018-04-03: qty 1

## 2018-04-03 NOTE — Care Management Note (Signed)
Case Management Note  Patient Details  Name: Paula Lucas MRN: [id number] Date of Birth: Nov 20, 1990                In-House Referral:  CSW Discharge planning Services  CM Consult, Medication Assistance, Follow-up appt scheduled   Status of Service:   in process    Additional Comments: Received notification from [place] that patient has medication needs.  CM met with patient and patient does not have job, or insurance.  CM verified no insurance by the Development worker, community. CM does not have a PCP, she said she goes to the emergency room when she is sick.  CM made a PCP appointment with the [facility] on 04/13/18 at 2:10 pm with Dr. Antony Blackbird.  Phone # [phone]- address- [address] [place].  Per Rolm Gala at the [facility] and [facility] patient can get her discharged medicines at no cost the day of discharge from hospital if they have medications in stock. Everything except narcotics or over the counter.  At the day of discharge CM will need to check with MD to see if discharging medicines are available at the Clinic.  8:45-5:15pm hours of pharmacy Monday- Friday. If medicine is not available there, CM can do a MATCH for patient at day of discharge for patient to obtain medicines for 3$ each.  Patient said she lives with her mom and she can help with that.  Will have CM follow.  Please call CM day of discharge.  # [phone]  Yong Channel, RN 04/03/2018, 5:29 PM

## 2018-04-03 NOTE — Progress Notes (Signed)
IR aware of request for aspiration and drainage of possible TOA.  Case reviewed by Dr. Lowella DandyHenn who approves patient for procedure which will likely be 04/04/18 as schedule allows.  Patient to be NPO after midnight.  RN to arrange transportation [facility] once time has been established with CT.   Loyce DysKacie Matthews, MS RD PA-C

## 2018-04-03 NOTE — Progress Notes (Signed)
Patient ID: Paula BertholdJavonna S Lucas, female   DOB: 03/08/1991, 27 y.o.   MRN: [id number] As noted by nursing, pt was planning to leave AMA. I was able to finish in the OR and speak to pt. Her concerns were addressed and the  POC was reviewed with her. She verbalized understanding and agreed to stay, continue with IV antibiotics and IR tomorrow for drainage of TOA.

## 2018-04-03 NOTE — Progress Notes (Addendum)
  HD 3 admitted with PID and right hydrosalpinx and bilateral ovarian cysts.    Subjective: Patient and FOB talked with at length last night.[facility]He did not get tx'd as he was tested in July but "they didn't call me back, " so he assumed he was negative Pt reports abdomen still sore both LQ and ruq.    Objective: I have reviewed patient's vital signs, medications and labs.  General: alert, cooperative and no distress Resp: clear to auscultation bilaterally GI: soft, non-tender; bowel sounds normal; no masses,  no organomegaly and abnormal findings:  guarding and equivocal rebound pain is equal , not worse. I cannot find a GC culture from Saturday's eval. Had + GC and CHL in July at first dx of PID.  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 Will contact IR re: percutaneous drainage of right hydrosalpinx. Pt NPO  LOS: 2 days    Paula BurrowJohn V Valmore Lucas 04/03/2018, 9:05 AM

## 2018-04-03 NOTE — Progress Notes (Signed)
Pt. Demanded IV ABX be stopped (Doxy infusing). Educated pt. On importance of IV ABX and infusion. Offered and slowed down IV infusion and placed heat on IV site. Also offered to restart IV on different site, which patient refused.  After ~ 5 mins of slower infusion patient asked RN to come to room and call MD. She would not explain to me at first why she wanted the MD called. I told her I have to be able to explain to the MD why I am calling and what the need is. She said no one explains anything to her. I again explained the need for IV ABX and IR procedure tomorrow to facilitate healing. I explained the IV ABX are working but need help with the IR procedure, I explained the IV ABX are keeping the infection from progressing.  She demanded the IV be removed and infusion stopped of which I did. She said she wanted to leave the hospital.  I called Dr. Alysia PennaErvin who was in the OR. He was explained the situation. He said if that is her choice she can sign the papers. Papers prepared. Pt. In shower currently.

## 2018-04-04 ENCOUNTER — Encounter (HOSPITAL_COMMUNITY): Payer: Self-pay | Admitting: Physician Assistant

## 2018-04-04 ENCOUNTER — Ambulatory Visit (HOSPITAL_COMMUNITY)
Admit: 2018-04-04 | Discharge: 2018-04-04 | Disposition: A | Discharge status: Home / Self Care | Payer: Self-pay | Attending: Obstetrics and Gynecology | Admitting: Obstetrics and Gynecology

## 2018-04-04 MED ORDER — FENTANYL CITRATE (PF) 100 MCG/2ML IJ SOLN
INTRAMUSCULAR | Status: AC
  Filled 2018-04-04: qty 2

## 2018-04-04 MED ORDER — FENTANYL CITRATE (PF) 100 MCG/2ML IJ SOLN
INTRAMUSCULAR | Status: AC | PRN
  Administered 2018-04-04 (×2): 50 ug via INTRAVENOUS

## 2018-04-04 MED ORDER — MIDAZOLAM HCL 2 MG/2ML IJ SOLN
INTRAMUSCULAR | Status: AC
  Filled 2018-04-04: qty 4

## 2018-04-04 MED ORDER — SODIUM CHLORIDE 0.9% FLUSH
5.0000 mL | Freq: Three times a day (TID) | INTRAVENOUS | Status: DC
  Administered 2018-04-05 – 2018-04-07 (×6): 5 mL

## 2018-04-04 MED ORDER — LIDOCAINE HCL (PF) 1 % IJ SOLN
INTRAMUSCULAR | Status: AC | PRN
  Administered 2018-04-04: 10 mL

## 2018-04-04 MED ORDER — MIDAZOLAM HCL 2 MG/2ML IJ SOLN
INTRAMUSCULAR | Status: AC | PRN
  Administered 2018-04-04 (×2): 1 mg via INTRAVENOUS
  Administered 2018-04-04: 2 mg via INTRAVENOUS

## 2018-04-04 NOTE — Progress Notes (Signed)
Gynecology Progress Note  Admission Date: 04/01/2018 Current Date: 04/04/2018 9:58 AM  Alethia BertholdJavonna S Slavens is a 27 y.o. G1P0010 HD#4 admitted for hydrosalpinx, suspected TOA   History complicated by: Patient Active Problem List   Diagnosis Date Noted  . Neisseria gonorrheae 03/06/2018 04/02/2018  . TOA (tubo-ovarian abscess) 04/01/2018  . BV (bacterial vaginosis) 04/01/2018  . Left ovarian cyst 04/16/2011   ROS and patient/family/surgical history, located on admission H&P note dated 04/01/2018, have been reviewed, and there are no changes except as noted below  Subjective:  Patient is feeling okay. She reports pain is about the same as it has been in abdomen, she is just getting used to staff palpating her abdomen. She is ambulating and denies light-headedness or dizziness. She is passing flatus, had a BM this am after miralax. She is voiding spontaneously. She is NPO this am but has been tolerating regular diet without nausea/vomiting. States she is feeling slightly better overall. Does complain of some back pain.  Objective:   Vitals:   04/03/18 1146 04/03/18 1625 04/04/18 0403 04/04/18 0753  BP: 107/86 (!) 133/96 121/82 130/87  Pulse: 96 (!) 103 81 82  Resp: 18 18 14 18   Temp: 99.6 F (37.6 C) 98.6 F (37 C) 98.2 F (36.8 C) 98.5 F (36.9 C)  TempSrc: Oral Oral Oral Oral  SpO2: 100% 100% 99% 100%  Weight:      Height:        Total I/O In: 229.9 [IV Piggyback:229.9] Out: -   Intake/Output Summary (Last 24 hours) at 04/04/2018 0958 Last data filed at 04/04/2018 0900 Gross per 24 hour  Intake 4082.83 ml  Output 1200 ml  Net 2882.83 ml    Physical exam: BP 130/87 (BP Location: Right Arm)   Pulse 82   Temp 98.5 F (36.9 C) (Oral)   Resp 18   Ht 5\' 1"  (1.549 m)   Wt 62.6 kg   LMP 02/06/2018 Comment: negative HCG blood test 04-01-2018  SpO2 100%   BMI 26.07 kg/m  CONSTITUTIONAL: Well-developed, well-nourished female in no acute distress.  HENT:  Normocephalic,  atraumatic, External right and left ear normal. Oropharynx is clear and moist EYES: Conjunctivae and EOM are normal. Pupils are equal, round, and reactive to light. No scleral icterus.  NECK: Normal range of motion, supple, no masses.  Normal thyroid.  SKIN: Skin is warm and dry. No rash noted. Not diaphoretic. No erythema. No pallor. NEUROLOGIC: Alert and oriented to person, place, and time. Normal reflexes, muscle tone coordination. No cranial nerve deficit noted. PSYCHIATRIC: Normal mood and affect. Normal behavior. Normal judgment and thought content. CARDIOVASCULAR: Normal heart rate noted, regular rhythm RESPIRATORY: Clear to auscultation bilaterally. Effort and breath sounds normal, no problems with respiration noted. ABDOMEN: Soft, no distention, mildly tender to palpation in all quadrants, no overt tenderness  PELVIC: deferred MUSCULOSKELETAL: Normal range of motion. No tenderness.  No cyanosis, clubbing, or edema.    Labs  Recent Labs  Lab 04/01/18 1424 04/02/18 0557  WBC 12.1* 10.2  HGB 12.8 10.2*  HCT 38.8 29.6*  PLT 295 220    Assessment & Plan:   Patient is 27 y.o. G1P0010 HD#4 s/p admitted for right hydrosalpinx, suspected left tubo-ovarian abscess with positive gonorrhea/chlamydia cultures. Has been on cefotan, doxycycline and flagyl for 72 hrs, afebrile and white count improved.  Abdominal exam slightly improved per patient. 5 cm left ovarian mass suspicious for TOA, she is for CT guided drainage today by IR and has been  NPO.  Will review after IR drainage today. Cont IV antibiotics for now    K. Therese SarahMeryl Grissel Tyrell, M.D. [facility]

## 2018-04-04 NOTE — Procedures (Signed)
TOA  S/p CT left pelvic drain  No comp Stable EBL min Full report in pacs cx sent

## 2018-04-04 NOTE — H&P (Addendum)
Chief Complaint: Tubo-ovarian abscess  Referring Physician(s): Christin BachJohn Ferguson  Supervising Physician: Ruel FavorsShick, Trevor  Patient Status: [facility]  History of Present Illness: Paula Lucas is a 27 y.o. female was diagnosed with TOA on right side on 03/06/2018.   She was offered admission, but left AMA.   She returned on 04/01/2018 with fever and worsening abdominal pain.   She also c/o nausea and vomiting and is unable to tolerate PO.  She was positive for GC and Chlamydia in July. Unsure if she was treated and her partner wasn't treated at that time either.  CT scan =  IMPRESSION: 1. The right fallopian tube is filled with fluid, more prominent in the interval. This could represent worsening hydrosalpinx or pyosalpinx. 2. The complex mass in the right ovary with a lacy appearance is smaller in the interval. This was previously suggested represent a hemorrhagic cyst and the interval decrease in size suggests improvement. Recommend continued attention on follow-up. 3. New mildly complicated cystic mass in the left ovary measuring 5.3 x 5.3 x 5.0 cm. This could represent a dominant mildly complicated follicle. Given the mild complexity, I suspect a tubo-ovarian abscess in the left ovary is less likely but not completely excluded.  WBC is 12.  She has been receiving Cefotetan and doxycycline.  Past Medical History:  Diagnosis Date  . Asthma   . Asthma     Past Surgical History:  Procedure Laterality Date  . INDUCED ABORTION    . NO PAST SURGERIES    . TONSILLECTOMY      Allergies: Fish allergy; Ibuprofen [ibuprofen]; Mushroom extract complex; and Tylenol [acetaminophen]  Medications: Prior to Admission medications   Medication Sig Start Date End Date Taking? Authorizing Provider  naproxen (NAPROSYN) 375 MG tablet Take 750 mg by mouth as needed for mild pain.   Yes [provider]  hydrOXYzine (ATARAX/VISTARIL) 25 MG tablet Take  1 tablet (25 mg total) by mouth every 6 (six) hours. Patient not taking: Reported on 04/01/2018 02/17/18   Dietrich PatesKhatri, Hina, PA-C  predniSONE (STERAPRED UNI-PAK 21 TAB) 10 MG (21) TBPK tablet Take by mouth daily. Take 6 tabs by mouth daily  for 2 days, then 5 tabs for 2 days, then 4 tabs for 2 days, then 3 tabs for 2 days, 2 tabs for 2 days, then 1 tab by mouth daily for 2 days Patient not taking: Reported on 04/01/2018 02/17/18   Dietrich PatesKhatri, Hina, PA-C     Family History  Problem Relation Age of Onset  . Diabetes Mother   . Hypertension Mother   . Diabetes Father   . Hypertension Father   . Diabetes Sister   . Hypertension Sister     Social History   Socioeconomic History  . Marital status: Single    Spouse name: Not on file  . Number of children: Not on file  . Years of education: Not on file  . Highest education level: Not on file  Occupational History  . Occupation: UNEMPLOYED  Social Needs  . Financial resource strain: Somewhat hard  . Food insecurity:    Worry: Sometimes true    Inability: Sometimes true  . Transportation needs:    Medical: Yes    Non-medical: Yes  Tobacco Use  . Smoking status: Smoker, Current Status Unknown    Packs/day: 0.50    Years: 9.00    Pack years: 4.50    Types: Cigarettes  . Smokeless tobacco: Never Used  Substance and Sexual Activity  .  Alcohol use: No  . Drug use: Yes    Types: Marijuana    Comment: THC GUMMIES LAST MONTH  . Sexual activity: Yes  Lifestyle  . Physical activity:    Days per week: 1 day    Minutes per session: 120 min  . Stress: Not at all  Relationships  . Social connections:    Talks on phone: Once a week    Gets together: Once a week    Attends religious service: Not on file    Active member of club or organization: No    Attends meetings of clubs or organizations: Never    Relationship status: Never married  Other Topics Concern  . Not on file  Social History Narrative  . Not on file     Review of Systems: A  12 point ROS discussed and pertinent positives are indicated in the HPI above.  All other systems are negative. Review of Systems  Vital Signs: BP 130/87 (BP Location: Right Arm)   Pulse 82   Temp 98.5 F (36.9 C) (Oral)   Resp 18   Ht 5\' 1"  (1.549 m)   Wt 62.6 kg   LMP 02/06/2018 Comment: negative HCG blood test 04-01-2018  SpO2 100%   BMI 26.07 kg/m   Physical Exam  Constitutional: She is oriented to person, place, and time. She appears well-developed and well-nourished.  HENT:  Head: Normocephalic and atraumatic.  Eyes: EOM are normal.  Neck: Normal range of motion.  Cardiovascular: Normal rate, regular rhythm and normal heart sounds.  Pulmonary/Chest: Effort normal and breath sounds normal.  Abdominal: There is tenderness.  Musculoskeletal: Normal range of motion.  Neurological: She is alert and oriented to person, place, and time.  Skin: Skin is warm and dry.  Psychiatric: She has a normal mood and affect. Her behavior is normal. Judgment and thought content normal.  Vitals reviewed.   Imaging: US Pelvis Transvanginal Non-ob (tv Only)  Result Date: 04/02/2018 CLINICAL DATA:  Patient diagnosed with tubo-ovarian abscess on the right March 06, 2018. She returned yesterday due to worsening symptoms. EXAM: ULTRASOUND PELVIS TRANSVAGINAL TECHNIQUE: Transvaginal ultrasound examination of the pelvis was performed including evaluation of the uterus, ovaries, adnexal regions, and pelvic cul-de-sac. COMPARISON:  Ultrasound March 06, 2018.  CT scan April 01, 2018 FINDINGS: Uterus Measurements: 7.1 x 3.6 x 5.1 cm. No fibroids or other mass visualized. Endometrium Thickness: 6 mm.  No focal abnormality visualized. Right ovary Measurements: 6.3 x 4.7 x 3.9 cm. The complex lacy appearing mass in the right ovary measures 3.4 x 3.4 x 3.4 cm today versus 5.5 x 4.2 x 4.8 cm previously. No other suspicious masses in the right ovary. Left ovary Measurements: 7.2 x 5.6 x 6.3 cm. There is a mildly  complex cystic mass in the left ovary measuring 5.3 x 5.3 x 5.0 cm. This is new in the interval. Other findings: The complex fluid around the left ovary on the previous study is not as discretely seen today. There is a tubular collection of fluid in the right adnexa thought to be a fluid-filled fallopian tube which was not definitely seen previously. IMPRESSION: 1. The right fallopian tube is filled with fluid, more prominent in the interval. This could represent worsening hydrosalpinx or pyosalpinx. 2. The complex mass in the right ovary with a lacy appearance is smaller in the interval. This was previously suggested represent a hemorrhagic cyst and the interval decrease in size suggests improvement. Recommend continued attention on follow-up. 3. New mildly complicated  cystic mass in the left ovary measuring 5.3 x 5.3 x 5.0 cm. This could represent a dominant mildly complicated follicle. Given the mild complexity, I suspect a tubo-ovarian abscess in the left ovary is less likely but not completely excluded. These results will be called to the ordering clinician or representative by the Radiologist Assistant, and communication documented in the PACS or zVision Dashboard. Electronically Signed   By: Gerome Sam III M.D   On: 04/02/2018 18:22   US Transvaginal Non-ob  Result Date: 03/06/2018 CLINICAL DATA:  Initial evaluation for acute right lower quadrant and pelvic pain. Recent untreated TOA. EXAM: TRANSABDOMINAL AND TRANSVAGINAL ULTRASOUND OF PELVIS DOPPLER ULTRASOUND OF OVARIES TECHNIQUE: Both transabdominal and transvaginal ultrasound examinations of the pelvis were performed. Transabdominal technique was performed for global imaging of the pelvis including uterus, ovaries, adnexal regions, and pelvic cul-de-sac. It was necessary to proceed with endovaginal exam following the transabdominal exam to visualize the uterus, endometrium, and ovaries. Color and duplex Doppler ultrasound was utilized to evaluate  blood flow to the ovaries. COMPARISON:  None. FINDINGS: Uterus Measurements: 8.1 x 3.2 x 4.1 cm. 8 x 5 x 6 mm subserosal fibroid present at the anterior uterine body. Endometrium Thickness: 6 mm.  No focal abnormality visualized. Right ovary Measurements: 7.6 x 6.4 x 6.2 cm. Complex cystic lesion measuring 5.5 x 4.2 x 4.8 cm with internal lace-like architecture, most characteristic of a hemorrhagic cyst. Adjacent heterogeneous isoechoic lesion within the right ovary measures 2.0 x 2.4 x 2.0 cm, indeterminate, but could reflect a complex cyst with internal proteinaceous material and/or hemorrhage. Left ovary Measurements: 4.0 x 2.0 x 2.6 cm. Normal appearance/no adnexal mass. Pulsed Doppler evaluation of both ovaries demonstrates normal low-resistance arterial and venous waveforms. Other findings Complex fluid present within the left adnexal region with scattered internal septations and/or debris. IMPRESSION: 1. 5.5 cm complex right adnexal cystic lesion, most characteristic for a hemorrhagic cyst. Given size, a short interval follow-up ultrasound in 6-12 weeks is recommended to ensure resolution. 2. Complex free fluid with internal septations and/or debris within the left adnexa, suspected to reflect sequelae of recent infection/TOA given provided history. Correlation with symptomatology and laboratory values recommended, as persistent infection/PID would be difficult to exclude, and could be considered in the correct clinical setting. 3. No evidence for ovarian torsion. 4. 8 mm uterine fibroid. Electronically Signed   By: Rise Mu M.D.   On: 03/06/2018 20:40   US Pelvis Complete  Result Date: 03/06/2018 CLINICAL DATA:  Initial evaluation for acute right lower quadrant and pelvic pain. Recent untreated TOA. EXAM: TRANSABDOMINAL AND TRANSVAGINAL ULTRASOUND OF PELVIS DOPPLER ULTRASOUND OF OVARIES TECHNIQUE: Both transabdominal and transvaginal ultrasound examinations of the pelvis were performed.  Transabdominal technique was performed for global imaging of the pelvis including uterus, ovaries, adnexal regions, and pelvic cul-de-sac. It was necessary to proceed with endovaginal exam following the transabdominal exam to visualize the uterus, endometrium, and ovaries. Color and duplex Doppler ultrasound was utilized to evaluate blood flow to the ovaries. COMPARISON:  None. FINDINGS: Uterus Measurements: 8.1 x 3.2 x 4.1 cm. 8 x 5 x 6 mm subserosal fibroid present at the anterior uterine body. Endometrium Thickness: 6 mm.  No focal abnormality visualized. Right ovary Measurements: 7.6 x 6.4 x 6.2 cm. Complex cystic lesion measuring 5.5 x 4.2 x 4.8 cm with internal lace-like architecture, most characteristic of a hemorrhagic cyst. Adjacent heterogeneous isoechoic lesion within the right ovary measures 2.0 x 2.4 x 2.0 cm, indeterminate, but could reflect a  complex cyst with internal proteinaceous material and/or hemorrhage. Left ovary Measurements: 4.0 x 2.0 x 2.6 cm. Normal appearance/no adnexal mass. Pulsed Doppler evaluation of both ovaries demonstrates normal low-resistance arterial and venous waveforms. Other findings Complex fluid present within the left adnexal region with scattered internal septations and/or debris. IMPRESSION: 1. 5.5 cm complex right adnexal cystic lesion, most characteristic for a hemorrhagic cyst. Given size, a short interval follow-up ultrasound in 6-12 weeks is recommended to ensure resolution. 2. Complex free fluid with internal septations and/or debris within the left adnexa, suspected to reflect sequelae of recent infection/TOA given provided history. Correlation with symptomatology and laboratory values recommended, as persistent infection/PID would be difficult to exclude, and could be considered in the correct clinical setting. 3. No evidence for ovarian torsion. 4. 8 mm uterine fibroid. Electronically Signed   By: Rise Mu M.D.   On: 03/06/2018 20:40   Ct Abdomen  Pelvis W Contrast  Result Date: 04/01/2018 CLINICAL DATA:  Low stomach pain for several weeks, worsening now. Nausea and vomiting. EXAM: CT ABDOMEN AND PELVIS WITH CONTRAST TECHNIQUE: Multidetector CT imaging of the abdomen and pelvis was performed using the standard protocol following bolus administration of intravenous contrast. CONTRAST:  ISOVUE-300 IOPAMIDOL (ISOVUE-300) INJECTION 61% COMPARISON:  Patient's prior CT from May 11, 2012 is not available on PACs for comparison, pelvic ultrasound March 06, 2018 FINDINGS: Lower chest: No acute abnormality. Hepatobiliary: Mild diffuse low density of the liver is identified. No focal liver lesion is noted. The gallbladder is normal. The biliary tree is normal. Pancreas: Unremarkable. No pancreatic ductal dilatation or surrounding inflammatory changes. Spleen: Normal in size without focal abnormality. Adrenals/Urinary Tract: The adrenal glands are normal. Small nonobstructing stones are identified in both kidneys. There is no hydronephrosis bilaterally. The bladder is partially decompressed limiting evaluation, but no gross abnormality is identified. Stomach/Bowel: Stomach is within normal limits. The appendix is not definitely seen. No evidence of bowel wall thickening, distention, or inflammatory changes. Vascular/Lymphatic: No significant vascular findings are present. No enlarged abdominal or pelvic lymph nodes. Reproductive: There are several cystic lesions with surrounding fluid rising from both ovaries. The largest lesion is in the left ovary measuring 5.5 x 4.3 cm. There is question cystic lesions extending beyond the right and left ovary particularly in the anterior right lower quadrant on image 64 and 63 with surrounding inflammatory change. Moderate free fluid is identified in the pelvis. The uterus is unremarkable. Other: None. Musculoskeletal: No acute or significant osseous findings. IMPRESSION: There are several cystic lesions with surrounding  fluid rising from both ovaries. The largest lesion is in the left ovary measuring 5.5 x 4.3 cm. There is question cystic lesions extending beyond the right and left ovary particularly in the anterior right lower quadrant on image 64 and 63 with surrounding inflammatory change. Moderate free fluid is identified in the pelvis. These are nonspecific, but PID with tubo-ovarian abscesses are not excluded. Electronically Signed   By: Sherian Rein M.D.   On: 04/01/2018 15:38   [place] Art/ven Flow Abd Pelv Doppler  Result Date: 03/06/2018 CLINICAL DATA:  Initial evaluation for acute right lower quadrant and pelvic pain. Recent untreated TOA. EXAM: TRANSABDOMINAL AND TRANSVAGINAL ULTRASOUND OF PELVIS DOPPLER ULTRASOUND OF OVARIES TECHNIQUE: Both transabdominal and transvaginal ultrasound examinations of the pelvis were performed. Transabdominal technique was performed for global imaging of the pelvis including uterus, ovaries, adnexal regions, and pelvic cul-de-sac. It was necessary to proceed with endovaginal exam following the transabdominal exam to visualize the uterus,  endometrium, and ovaries. Color and duplex Doppler ultrasound was utilized to evaluate blood flow to the ovaries. COMPARISON:  None. FINDINGS: Uterus Measurements: 8.1 x 3.2 x 4.1 cm. 8 x 5 x 6 mm subserosal fibroid present at the anterior uterine body. Endometrium Thickness: 6 mm.  No focal abnormality visualized. Right ovary Measurements: 7.6 x 6.4 x 6.2 cm. Complex cystic lesion measuring 5.5 x 4.2 x 4.8 cm with internal lace-like architecture, most characteristic of a hemorrhagic cyst. Adjacent heterogeneous isoechoic lesion within the right ovary measures 2.0 x 2.4 x 2.0 cm, indeterminate, but could reflect a complex cyst with internal proteinaceous material and/or hemorrhage. Left ovary Measurements: 4.0 x 2.0 x 2.6 cm. Normal appearance/no adnexal mass. Pulsed Doppler evaluation of both ovaries demonstrates normal low-resistance arterial and venous  waveforms. Other findings Complex fluid present within the left adnexal region with scattered internal septations and/or debris. IMPRESSION: 1. 5.5 cm complex right adnexal cystic lesion, most characteristic for a hemorrhagic cyst. Given size, a short interval follow-up ultrasound in 6-12 weeks is recommended to ensure resolution. 2. Complex free fluid with internal septations and/or debris within the left adnexa, suspected to reflect sequelae of recent infection/TOA given provided history. Correlation with symptomatology and laboratory values recommended, as persistent infection/PID would be difficult to exclude, and could be considered in the correct clinical setting. 3. No evidence for ovarian torsion. 4. 8 mm uterine fibroid. Electronically Signed   By: Rise MuBenjamin  McClintock M.D.   On: 03/06/2018 20:40    Labs:  CBC: Recent Labs    01/23/18 1342 03/06/18 1402 04/01/18 1424 04/02/18 0557  WBC 13.8* 12.8* 12.1* 10.2  HGB 12.6 12.8 12.8 10.2*  HCT 38.7 38.5 38.8 29.6*  PLT 353 333 295 220    COAGS: Recent Labs    04/03/18 1604  INR 1.09    BMP: Recent Labs    01/23/18 1342 03/06/18 1402 04/01/18 1424  NA 142 143 140  K 3.6 3.6 4.0  CL 105 109 112*  CO2 27 26 21*  GLUCOSE 100* 103* 100*  BUN 9 9 8   CALCIUM 9.1 9.5 8.9  CREATININE 1.01* 0.78 0.80  GFRNONAA >60 >60 >60  GFRAA >60 >60 >60    LIVER FUNCTION TESTS: Recent Labs    01/23/18 1342 03/06/18 1402 04/01/18 1424  BILITOT 0.5 0.8 0.5  AST 15 20 19   ALT 9* 12 11  ALKPHOS 54 60 49  PROT 7.5 8.4* 7.8  ALBUMIN 3.8 4.1 3.9    TUMOR MARKERS: No results for input(s): AFPTM, CEA, CA199, CHROMGRNA in the last 8760 hours.  Assessment and Plan:  New mildly complicated cystic mass in the left ovary measuring 5.3 x 5.3 x 5.0 cm which is likely a tubo-ovarian abscess in the left ovary.  Will proceed with image guided aspiration/placement of drain today by Dr. Miles CostainShick.  Risks and benefits discussed with the patient  including bleeding, infection, damage to adjacent structures, bowel perforation/fistula connection, and sepsis.  All of the patient's questions were answered, patient is agreeable to proceed. Consent signed and in chart.  Thank you for this interesting consult.  I greatly enjoyed meeting Paula BertholdJavonna S Filion and look forward to participating in their care.  A copy of this report was sent to the requesting provider on this date.  Electronically Signed: Gwynneth MacleodWENDY S BLAIR, PA-C   04/04/2018, 10:37 AM      I spent a total of 40 Minutes in face to face in clinical consultation, greater than 50% of which was counseling/coordinating  care for drain placement.

## 2018-04-05 ENCOUNTER — Encounter (HOSPITAL_COMMUNITY): Payer: Self-pay | Admitting: Certified Registered Nurse Anesthetist

## 2018-04-05 NOTE — Progress Notes (Signed)
     Subjective:  Patient feeling okay, states pain is 7/10. When pressed to be more specific, she does say overall, she is feeling better. She does report pain is diffusely better, has not been able to touch her abdomen without pain prior to today, however today she is able to touch abdomen without pain. Appetite improving. Ambulating, voiding without issue. Denies other complaints.   Objective:   Vitals:   04/05/18 0401 04/05/18 0918 04/05/18 1142 04/05/18 1534  BP: 121/80 122/79 118/83 130/87  Pulse: 85 77 79 71  Resp: 16 18 17 18   Temp: 98.3 F (36.8 C) 98.5 F (36.9 C) 97.8 F (36.6 C)   TempSrc: Oral  Oral   SpO2: 99% 99% 100% 100%  Weight:      Height:        Physical exam: BP 130/87 (BP Location: Right Arm)   Pulse 71   Temp 97.8 F (36.6 C) (Oral)   Resp 18   Ht 5\' 1"  (1.549 m)   Wt 62.6 kg   LMP 02/06/2018 Comment: negative HCG blood test 04-01-2018  SpO2 100%   BMI 26.07 kg/m  CONSTITUTIONAL: Well-developed, well-nourished female in no acute distress.  HENT:  Normocephalic, atraumatic, External right and left ear normal. Oropharynx is clear and moist EYES: Conjunctivae and EOM are normal. Pupils are equal, round, and reactive to light. No scleral icterus.  NECK: Normal range of motion, supple, no masses.  Normal thyroid.  SKIN: Skin is warm and dry. No rash noted. Not diaphoretic. No erythema. No pallor. NEUROLOGIC: Alert and oriented to person, place, and time. Normal reflexes, muscle tone coordination. No cranial nerve deficit noted. PSYCHIATRIC: Normal mood and affect. Normal behavior. Normal judgment and thought content. CARDIOVASCULAR: Normal heart rate noted, regular rhythm RESPIRATORY: Clear to auscultation bilaterally. Effort and breath sounds normal, no problems with respiration noted. ABDOMEN: Soft, remains diffusely tender but improved from prior exam. JP drain in place with bandage over site, no rebound or guarding.  PELVIC:  deferred MUSCULOSKELETAL: Normal range of motion. No tenderness.  No cyanosis, clubbing, or edema.    JP output: 25 mL overnight, 10 mL today  Labs  Recent Labs  Lab 04/01/18 1424 04/02/18 0557  WBC 12.1* 10.2  HGB 12.8 10.2*  HCT 38.8 29.6*  PLT 295 220     Assessment & Plan:   Patient is 27 y.o. G1P0010 HD#5 s/p admitted for right hydrosalpinx, suspected left tubo-ovarian abscess with positive gonorrhea/chlamydia cultures. Has been on cefotan, doxycycline and flagyl. JP drain placed yesterday by IR with drainage from left ovarian abscess. Abdominal exam and patient reported pain improved today. Will cont IV antibiotics overnight and re-eval in am.     Baldemar LenisK. Meryl Jermia Rigsby, M.D. [facility]

## 2018-04-06 LAB — CULTURE, BLOOD (ROUTINE X 2)
Culture: NO GROWTH
Culture: NO GROWTH
SPECIAL REQUESTS: ADEQUATE

## 2018-04-06 MED ORDER — SODIUM CHLORIDE 0.9 % IV SOLN
INTRAVENOUS | Status: DC
  Administered 2018-04-06: 18:00:00 via INTRAVENOUS

## 2018-04-06 NOTE — Progress Notes (Signed)
     Subjective:  Patient feeling better but still tender, 5/10 or so Eating without problems She is afebrile  Objective:   Vitals:   04/05/18 1930 04/05/18 2017 04/06/18 0012 04/06/18 0442  BP:  (!) 127/92 110/79 (!) 85/52  Pulse:  79 84 88  Resp: 19 18 18 18   Temp:  98.6 F (37 C) 98.4 F (36.9 C) 98.4 F (36.9 C)  TempSrc:      SpO2:  99% 100% 98%  Weight:      Height:        Physical exam: BP (!) 85/52 (BP Location: Right Arm)   Pulse 88   Temp 98.4 F (36.9 C)   Resp 18   Ht 5\' 1"  (1.549 m)   Wt 62.6 kg   LMP 02/06/2018 Comment: negative HCG blood test 04-01-2018  SpO2 98%   BMI 26.07 kg/m  CONSTITUTIONAL: Well-developed, well-nourished female in no acute distress.  HENT:  Normocephalic, atraumatic, External right and left ear normal. Oropharynx is clear and moist EYES: Conjunctivae and EOM are normal. Pupils are equal, round, and reactive to light. No scleral icterus.  NECK: Normal range of motion, supple, no masses.  Normal thyroid.  SKIN: Skin is warm and dry. No rash noted. Not diaphoretic. No erythema. No pallor. NEUROLOGIC: Alert and oriented to person, place, and time. Normal reflexes, muscle tone coordination. No cranial nerve deficit noted. PSYCHIATRIC: Normal mood and affect. Normal behavior. Normal judgment and thought content. CARDIOVASCULAR: Normal heart rate noted, regular rhythm RESPIRATORY: Clear to auscultation bilaterally. Effort and breath sounds normal, no problems with respiration noted. ABDOMEN: Soft, remains diffusely tender . JP drain in place with bandage over site, no rebound or guarding.  PELVIC: deferred MUSCULOSKELETAL: Normal range of motion. No tenderness.  No cyanosis, clubbing, or edema.    JP output: 25 mL overnight, 10 mL today  Labs  CBC Latest Ref Rng & Units 04/02/2018 04/01/2018 03/06/2018  WBC 4.0 - 10.5 K/uL 10.2 12.1(H) 12.8(H)  Hemoglobin 12.0 - 15.0 g/dL 10.2(L) 12.8 12.8  Hematocrit 36.0 - 46.0 % 29.6(L) 38.8 38.5    Platelets 150 - 400 K/uL 220 295 333    Assessment & Plan:   Patient is 27 y.o. G1P0010 HD#5 s/p admitted for right hydrosalpinx, suspected left tubo-ovarian abscess with positive gonorrhea/chlamydia cultures. Has been on cefotan, doxycycline and flagyl. JP drain placed 04/04/2018 by IR with drainage from left ovarian abscess.   Continue IV antibiotics for 24 more hours and then transition to oral and anticiapte discharge    Lazaro ArmsLuther H Eure, MD  Patient ID: Paula Lucas, female   DOB: [birth year], 27 y.o.   MRN: [id number]

## 2018-04-06 NOTE — Progress Notes (Signed)
Patient and significant other educated on JP drain care, empty and recharging, changing dressing and flushing drain. Significant other Josh performed this care under the supervision of RN. Both verbalize understanding

## 2018-04-07 MED ORDER — DOXYCYCLINE HYCLATE 100 MG PO CAPS: 100.0000 mg | ORAL_CAPSULE | Freq: Two times a day (BID) | ORAL | 0 refills | Status: DC

## 2018-04-07 MED ORDER — DOXYCYCLINE HYCLATE 100 MG PO CAPS: 100.0000 mg | ORAL_CAPSULE | Freq: Two times a day (BID) | ORAL | 0 refills | Status: AC

## 2018-04-07 MED ORDER — OXYCODONE HCL 5 MG PO TABS: 5.0000 mg | ORAL_TABLET | Freq: Four times a day (QID) | ORAL | 0 refills | Status: DC | PRN

## 2018-04-07 MED ORDER — METRONIDAZOLE 500 MG PO TABS: 500.0000 mg | ORAL_TABLET | Freq: Two times a day (BID) | ORAL | 0 refills | Status: DC

## 2018-04-07 MED ORDER — OXYCODONE HCL 5 MG PO TABS: 5.0000 mg | ORAL_TABLET | ORAL | 0 refills | Status: DC | PRN

## 2018-04-07 MED ORDER — METRONIDAZOLE 500 MG PO TABS: 500.0000 mg | ORAL_TABLET | Freq: Two times a day (BID) | ORAL | 0 refills | Status: AC

## 2018-04-07 MED FILL — metroNIDAZOLE 500 MG TABS: 500 | 30 days supply | Qty: 60 | Fill #0

## 2018-04-07 MED FILL — DOXYCYCLINE HYCLATE 100 MG: 100 | 30 days supply | Qty: 60 | Fill #0

## 2018-04-07 NOTE — Progress Notes (Signed)
CM verified with [facility] that they have prescribed antibiotics for pt.  [facility].  Pt aware she must be there by 515 to pick up.  Kathi Dererri Donavon Kimrey RNC-MNN, BSN

## 2018-04-07 NOTE — Discharge Summary (Signed)
Physician Discharge Summary  Patient ID: Paula Lucas MRN: [id number] DOB/AGE: June 15, 1991 27 y.o.  Admit date: 04/01/2018 Discharge date: 04/07/2018  Admission Diagnoses: right hydrosalpinx, left tubo-ovarian abscess  Discharge Diagnoses:  Principal Problem:   TOA (tubo-ovarian abscess) Active Problems:   BV (bacterial vaginosis)   Neisseria gonorrheae 03/06/2018   Discharged Condition: fair  Hospital Course: Please see HPI dated 04/01/2018 for full details. Briefly, this is a 27 y.o. [id number] female admitted for tubo-ovarian abscess. She did well on IV antibiotics and then improved signficicantly after drain was placed by interventional radiology. Her exam improved by day 6 to the point that she was transitioned to oral antibiotics. She has improved appetite and pain significantly improved. She was discharged home HD#7 with PO antibiotics and instructions to f/u in office in two weeks. She will follow up with IR as well for drain removal.    Physical Exam:  BP 119/74 (BP Location: Left Arm)   Pulse 71   Temp 98.1 F (36.7 C) (Oral)   Resp 18   Ht 5\' 1"  (1.549 m)   Wt 62.6 kg   LMP 02/06/2018 Comment: negative HCG blood test 04-01-2018  SpO2 100%   BMI 26.07 kg/m  General: alert, oriented, cooperative Chest: CTAB, normal respiratory effort Heart: RRR  Abdomen: +BS, soft, remains mildly tender to palpation in lower quadrants, much improved from prior exams, drain covered by dressing with no evidence of active bleeding  DVT Evaluation: no evidence of DVT Extremities: no edema, no calf tenderness   Labs: Lab Results  Component Value Date   WBC 10.2 04/02/2018   HGB 10.2 (L) 04/02/2018   HCT 29.6 (L) 04/02/2018   MCV 90.0 04/02/2018   PLT 220 04/02/2018   CMP Latest Ref Rng & Units 04/01/2018  Glucose 70 - 99 mg/dL 409(W100(H)  BUN 6 - 20 mg/dL 8  Creatinine 1.190.44 - 1.471.00 mg/dL 8.290.80  Sodium 562135 - 130145 mmol/L 140  Potassium 3.5 - 5.1 mmol/L 4.0  Chloride 98 - 111 mmol/L  112(H)  CO2 22 - 32 mmol/L 21(L)  Calcium 8.9 - 10.3 mg/dL 8.9  Total Protein 6.5 - 8.1 g/dL 7.8  Total Bilirubin 0.3 - 1.2 mg/dL 0.5  Alkaline Phos 38 - 126 U/L 49  AST 15 - 41 U/L 19  ALT 0 - 44 U/L 11    Disposition: Discharge disposition: 01-Home or Self Care       Discharge Instructions    Call MD for:  difficulty breathing, headache or visual disturbances   Complete by:  As directed    Call MD for:  extreme fatigue   Complete by:  As directed    Call MD for:  hives   Complete by:  As directed    Call MD for:  persistant dizziness or light-headedness   Complete by:  As directed    Call MD for:  persistant nausea and vomiting   Complete by:  As directed    Call MD for:  redness, tenderness, or signs of infection (pain, swelling, redness, odor or green/yellow discharge around incision site)   Complete by:  As directed    Call MD for:  severe uncontrolled pain   Complete by:  As directed    Call MD for:  temperature >100.4   Complete by:  As directed    Diet - low sodium heart healthy   Complete by:  As directed    Discharge instructions   Complete by:  As directed    -  Flush drain with 3-5 mL of sterile saline daily.  -Change dressing as needed in order to keep drain site clean and dry -Keep a log of amount of output daily from the drain -You will hear from schedulers with time and date of appointment to follow-up with Interventional Radiology outpatient clinic.   Increase activity slowly   Complete by:  As directed      An After Visit Summary was printed and given to the patient. Allergies as of 04/07/2018      Reactions   Fish Allergy Anaphylaxis   Severe allergic reaction to all seafoods   Ibuprofen [ibuprofen] Anaphylaxis   Mushroom Extract Complex Anaphylaxis   Tylenol [acetaminophen] Anaphylaxis      Medication List    TAKE these medications   doxycycline 100 MG capsule Commonly known as:  VIBRAMYCIN Take 1 capsule (100 mg total) by mouth 2 (two)  times daily.   hydrOXYzine 25 MG tablet Commonly known as:  ATARAX/VISTARIL Take 1 tablet (25 mg total) by mouth every 6 (six) hours.   metroNIDAZOLE 500 MG tablet Commonly known as:  FLAGYL Take 1 tablet (500 mg total) by mouth 2 (two) times daily.   naproxen 375 MG tablet Commonly known as:  NAPROSYN Take 750 mg by mouth as needed for mild pain.   oxyCODONE 5 MG immediate release tablet Commonly known as:  Oxy IR/ROXICODONE Take 1 tablet (5 mg total) by mouth every 6 (six) hours as needed for moderate pain.   predniSONE 10 MG (21) Tbpk tablet Commonly known as:  STERAPRED UNI-PAK 21 TAB Take by mouth daily. Take 6 tabs by mouth daily  for 2 days, then 5 tabs for 2 days, then 4 tabs for 2 days, then 3 tabs for 2 days, 2 tabs for 2 days, then 1 tab by mouth daily for 2 days      Follow-up Information    [facility] Follow up.   Why:  PCP- appointment made for August 22 nd , 2019 at 2:10 pm with Cain Saupeammie Fulp MD Contact information: [address] [place] [id number] [phone]       Berdine DanceShick, Michael, MD Follow up.   Specialties:  Interventional Radiology, Radiology Why:  Schedulers will contact you with date and time of appointment.  Contact information: [address] [place] [address] [phone]        [facility]. Schedule an appointment as soon as possible for a visit in 2 week(s).   Specialty:  Obstetrics and Gynecology Why:  schedule f/u appt with Dr. Elodia Florenceavis Contact information: [address], [address] [place] [id number] [phone]          Signed: Conan BowensKelly M Ryenne Lynam 04/07/2018, 11:48 AM

## 2018-04-09 LAB — AEROBIC/ANAEROBIC CULTURE W GRAM STAIN (SURGICAL/DEEP WOUND)

## 2018-04-09 LAB — AEROBIC/ANAEROBIC CULTURE (SURGICAL/DEEP WOUND): CULTURE: NO GROWTH

## 2018-04-10 ENCOUNTER — Other Ambulatory Visit: Payer: Self-pay | Admitting: Obstetrics and Gynecology

## 2018-04-10 DIAGNOSIS — N7093 Salpingitis and oophoritis, unspecified: Secondary | ICD-10-CM

## 2018-04-11 ENCOUNTER — Other Ambulatory Visit: Payer: Self-pay

## 2018-04-13 ENCOUNTER — Ambulatory Visit: Payer: Self-pay | Attending: Family Medicine | Admitting: Family Medicine

## 2018-04-13 ENCOUNTER — Encounter: Payer: Self-pay | Admitting: Family Medicine

## 2018-04-13 ENCOUNTER — Other Ambulatory Visit: Payer: Self-pay

## 2018-04-13 ENCOUNTER — Ambulatory Visit: Payer: Medicaid Other | Attending: Family Medicine | Admitting: Licensed Clinical Social Worker

## 2018-04-13 VITALS — BP 114/77 | HR 96 | Temp 99.5°F | Resp 18 | Ht 61.0 in | Wt 151.0 lb

## 2018-04-13 DIAGNOSIS — N7093 Salpingitis and oophoritis, unspecified: Secondary | ICD-10-CM

## 2018-04-13 DIAGNOSIS — Z23 Encounter for immunization: Secondary | ICD-10-CM

## 2018-04-13 DIAGNOSIS — F4323 Adjustment disorder with mixed anxiety and depressed mood: Secondary | ICD-10-CM

## 2018-04-13 DIAGNOSIS — R238 Other skin changes: Secondary | ICD-10-CM

## 2018-04-13 DIAGNOSIS — R1030 Lower abdominal pain, unspecified: Secondary | ICD-10-CM

## 2018-04-13 LAB — POCT URINALYSIS DIP (CLINITEK)
Bilirubin, UA: NEGATIVE
Blood, UA: NEGATIVE
Glucose, UA: NEGATIVE mg/dL
Ketones, POC UA: NEGATIVE mg/dL
Nitrite, UA: NEGATIVE
Spec Grav, UA: >=1.03 — AB
Urobilinogen, UA: 0.2 U/dL
pH, UA: 6

## 2018-04-13 MED ORDER — TRAMADOL HCL 50 MG PO TABS: 50.0000 mg | ORAL_TABLET | Freq: Four times a day (QID) | ORAL | 0 refills | Status: AC | PRN

## 2018-04-13 MED ORDER — NYSTATIN 100000 UNIT/GM EX CREA: 1.0000 "application " | TOPICAL_CREAM | Freq: Three times a day (TID) | CUTANEOUS | 2 refills | Status: DC

## 2018-04-13 MED FILL — traMADol HCL 50 MG TABS: 50 | 7 days supply | Qty: 28 | Fill #0

## 2018-04-13 MED FILL — NYSTATIN 100,000 UNIT/GM CR: 100000 | 15 days supply | Qty: 30 | Fill #0

## 2018-04-13 NOTE — Progress Notes (Signed)
Subjective:    Patient ID: Paula Lucas, female    DOB: Nov 25, 1990, 27 y.o.   MRN: [id number]  HPI 27 yo female new to the practice who is s/p recent hospitalization from 04/01/18-04/07/18 for a tubo-ovarian abscess and treatment of gonorrhea, chlamydia and bacterial vaginosis.  Patient was treated with IV antibiotics and a drain was placed by interventional radiology to help with the abscess.  Patient has not yet started the antibiotics that were prescribed for her to start after her hospital discharge.  Patient states that she will pick up the antibiotics today.  Patient with complaint of pain in her lower abdomen.  Patient states that the clamp that holds the drainage tube is not functioning properly and therefore the drainage bulb hangs down and there is a pulling sensation on her lower abdomen.  Patient states that also when her boyfriend flushes her drain with normal saline, this causes discomfort in her lower abdomen.  Patient denies any fever or chills.  Patient does feel fatigued.  Patient states that her current pain is about a 5 or 6 on a 0-10 scale.  Patient states that she was not able to afford the oxycodone that was prescribed at the time of her hospital discharge so she has been taking oxycodone which has not helped.    Patient denies any current nausea.  Patient denies any burning with urination or urinary frequency.  Patient reports past medical history of asthma and scoliosis.  Past surgical history is significant for tonsillectomy.  Patient denies any tobacco use or alcohol use.  Patient is single but in a committed relationship.    Family History  Problem Relation Age of Onset  . Diabetes Mother   . Hypertension Mother   . Diabetes Father   . Hypertension Father   . Diabetes Sister   . Hypertension Sister    Allergies  Allergen Reactions  . Fish Allergy Anaphylaxis    Severe allergic reaction to all seafoods  . Ibuprofen [Ibuprofen] Anaphylaxis  . Mushroom Extract Complex  Anaphylaxis  . Tylenol [Acetaminophen] Anaphylaxis    Review of Systems Review of systems is negative for fever or chills.  Patient does have fatigue.  Patient denies any shortness of breath or cough, no chest pain or palpitations.  Patient does have abdominal pain but denies nausea.  Patient denies dysuria or urinary frequency.   Patient denies any headache or dizziness.  Patient denies anxiety or depression but does have some concerns about transportation/medical expenses. Objective:   Physical Exam BP 114/77 (BP Location: Right Arm, Patient Position: Sitting, Cuff Size: Normal)   Pulse 96   Temp 99.5 F (37.5 C) (Oral)   Resp 18   Ht 5\' 1"  (1.549 m)   Wt 151 lb (68.5 kg)   LMP 04/06/2018   SpO2 98%   BMI 28.53 kg/m   Vital signs reviewed. General- well-nourished, well-developed young adult female in no acute distress sitting on exam table.  Patient is accompanied by her boyfriend. ENT- TMs gray, nares with mild edema of the nasal turbinates, normal oropharynx Neck-supple, no lymphadenopathy, no thyromegaly. Lungs-clear to auscultation bilaterally Cardiovascular-regular rate and rhythm Abdomen- patient with abdominal distention, presence of bandage and drainage tube in the left mid to lower quadrant.  Patient with tenderness to palpation over the lower abdomen.  No rebound or guarding. Back-no CVA tenderness Extremities-no edema Psychologic-normal mood and judgment      Assessment & Plan:  1. Lower abdominal pain Patient with complaint of lower  abdominal pain.  Patient is status post hospitalization from 8/10 through 04/07/2018 secondary to fever, nausea vomiting and worsening abdominal pain.  Patient had been seen in the ED in July and offered admission secondary to possible tubo-ovarian abscess but had left AMA at that time.  Patient was admitted and on review of hospital records and labs/procedures, patient did test positive for gonorrhea chlamydia as well as bacterial  vaginitis.  Patient was placed on IV antibiotics and these were later changed to oral antibiotics after she clinically improved status post placement of drainage tube by interventional radiology.  Patient however has not been able to obtain the oral antibiotics that were prescribed status post discharge.  Will check urinalysis to see if she might have a urine infection but patient also may have worsening of untreated infection.  Patient will pick up antibiotics today from the pharmacy but CBC will also be done here to see if she has an elevation in her white blood cell count.  Patient is encouraged to contact both her GYN as well as interventional radiology.  Patient may need to return to the emergency department for reevaluation if she has any acute worsening of abdominal pain, nausea or vomiting, no diarrhea or constipation,no fever or any concerns.  Patient's urinalysis did show white blood cells but patient also with a rash in the groin area which she did not mention to me during the exam but rather inform the nurse by cell phone picture after her visit.  Will wait for urine culture results to see if any additional antibiotics will be needed. - POCT URINALYSIS DIP (CLINITEK) - CBC with Differential - traMADol (ULTRAM) 50 MG tablet; Take 1 tablet (50 mg total) by mouth every 6 (six) hours as needed for up to 7 days for moderate pain.  Dispense: 28 tablet; Refill: 0 - Urine Culture  2. Tubo-ovarian abscess Patient status post recent hospitalization for left tubo-ovarian abscess and right hydrosalpinx.  Patient unfortunately has been unable to pick up the prescription for antibiotics prescribed at time of discharge but will pick them up at today's visit.  Patient will have CBC to look for elevated white blood cell count.  Patient did have anemia on her most recent CBC done during her recent hospitalization with hemoglobin of 10.2 but her white blood cell count had normalized during hospital course but was  elevated at 12.1 on admission.  Patient is encouraged to contact interventional radiology regarding issues with the clip which holds her drainage tube as well as letting both GYN and IR know about her current lower abdominal pain.  Patient should start her antibiotics today but will be notified of white blood cell count is elevated or if there are other abnormalities on her CBC.  Prescription provided for tramadol to take as needed for pain. - CBC with Differential - traMADol (ULTRAM) 50 MG tablet; Take 1 tablet (50 mg total) by mouth every 6 (six) hours as needed for up to 7 days for moderate pain.  Dispense: 28 tablet; Refill: 0  3. Skin irritation Patient with complaint of irritation in her vaginal/groin area status post hospitalization.  He did not visualize the area other than by a picture taken via cell phone which the patient gave to the nurse.  Patient likely with fungal infection status post inpatient IV antibiotics.  Prescription provided for nystatin cream and patient should return to clinic for reevaluation if her skin is not improving within the next 7 to 10 days. - nystatin cream (MYCOSTATIN);  Apply 1 application topically 3 (three) times daily. Also apply at bedtime  Dispense: 30 g; Refill: 2  4. Need for immunization against influenza Patient was offered and agreed to receive influenza immunization at today's visit.  Patient was given informational handout regarding immunization as well as possible side effects. - Flu Vaccine QUAD 36+ mos IM  An After Visit Summary was printed and given to the patient.  Return in about 1 week (around 04/20/2018), or if symptoms worsen or fail to improve, for abdominal pain-call your GYN doctor as well if pain continues.

## 2018-04-13 NOTE — Patient Instructions (Signed)
Antibiotic Medicine, Adult  Antibiotic medicines treat infections caused by a type of germ called bacteria. They work by killing the bacteria that make you sick.  When do I need to take antibiotics?  You often need these medicines to treat bacterial infections, such as:  · A urinary tract infection (UTI).  · Strep throat.  · Meningitis. This affects the spinal cord and brain.  · A bad lung infection.    You may start the medicines while your doctor waits for tests to come back. When the tests come back, your doctor may change or stop your medicine.  When are antibiotics not needed?  You do not need these medicines for most common illnesses, such as:  · A cold.  · The flu.  · A sore throat.    Antibiotics are not always needed for all infections caused by bacteria. Do not ask for these medicines, or take them, when they are not needed.  What are the risks of taking antibiotics?  Most antibiotics can cause an infection called Clostridium difficile.This causes watery poop (diarrhea). Let your doctor know right away if:  · You have watery poop while taking an antibiotic.  · You have watery poop after you stop taking an antibiotic. The illness can happen weeks after you stop the medicine.    You also have a risk of getting an infection in the future that antibiotics cannot treat (antibiotic-resistant infection). This type of infection can be dangerous.  What else should I know about taking antibiotics?  · You need to take the entire prescription.  ? Take the medicine for as long as told by your doctor.  ? Do not stop taking it even if you start to feel better.  · Try not to miss any doses. If you miss a dose, call your doctor.  · Birth control pills may not work. If you take birth control pills:  ? Keep on taking them.  ? Use a second form of birth control, such as a condom. Do this for as long as told by your doctor.  · Ask your doctor:  ? How long to wait in between doses.  ? If you should take the medicine with  food.  ? If there is anything you should stay away from while taking the antibiotic, such as:  ? Food.  ? Drinks.  ? Medicines.  ? If there are any side effects you should watch for.  · Only take the medicines that your doctor told you to take. Do not take medicines that were given to someone else.  · Drink a large glass of water with the medicine.  · Ask the pharmacist for a tool to measure the medicine, such as:  ? A syringe.  ? A cup.  ? A spoon.  · Throw away any extra medicine.  Contact a doctor if:  · You get worse.  · You have new joint pain or muscle aches after starting the medicine.  · You have side effects from the medicine, such as:  ? Stomach pain.  ? Watery poop.  ? Feeling sick to your stomach (nausea).  Get help right away if:  · You have signs of a very bad allergic reaction. If this happens, stop taking the medicine right away. Signs may include:  ? Hives. These are raised, itchy, red bumps on the skin.  ? Skin rash.  ? Trouble breathing.  ? Wheezing.  ? Swelling.  ? Feeling dizzy.  ? Throwing up (  vomiting).  · Your pee (urine) is dark, or is the color of blood.  · Your skin turns yellow.  · You bruise easily.  · You bleed easily.  · You have very bad watery poop and cramps in your belly.  · You have a very bad headache.  Summary  · Antibiotics are often used to treat infections caused by bacteria.  · Only take these medicines when needed.  · Let your doctor know if you have watery poop while taking an antibiotic.  · You need to take the entire prescription.  This information is not intended to replace advice given to you by your health care provider. Make sure you discuss any questions you have with your health care provider.  Document Released: 05/18/2008 Document Revised: 08/11/2016 Document Reviewed: 08/11/2016  Elsevier Interactive Patient Education © 2017 [employer] Inc[employer]

## 2018-04-14 ENCOUNTER — Encounter: Payer: Self-pay | Admitting: Family Medicine

## 2018-04-14 LAB — CBC WITH DIFFERENTIAL/PLATELET
Basophils Absolute: 0 x10E3/uL (ref 0.0–0.2)
Basos: 0 %
EOS (ABSOLUTE): 0.3 x10E3/uL (ref 0.0–0.4)
Eos: 4 %
Hematocrit: 35 % (ref 34.0–46.6)
Hemoglobin: 11.2 g/dL (ref 11.1–15.9)
Immature Grans (Abs): 0 x10E3/uL (ref 0.0–0.1)
Immature Granulocytes: 0 %
Lymphocytes Absolute: 4.7 x10E3/uL — ABNORMAL HIGH (ref 0.7–3.1)
Lymphs: 54 %
MCH: 29.5 pg (ref 26.6–33.0)
MCHC: 32 g/dL (ref 31.5–35.7)
MCV: 92 fL (ref 79–97)
Monocytes Absolute: 0.4 x10E3/uL (ref 0.1–0.9)
Monocytes: 5 %
Neutrophils Absolute: 3.3 x10E3/uL (ref 1.4–7.0)
Neutrophils: 37 %
Platelets: 437 x10E3/uL (ref 150–450)
RBC: 3.8 x10E6/uL (ref 3.77–5.28)
RDW: 15.8 % — ABNORMAL HIGH (ref 12.3–15.4)
WBC: 8.7 x10E3/uL (ref 3.4–10.8)

## 2018-04-14 NOTE — BH Specialist Note (Signed)
Integrated Behavioral Health Initial Visit  MRN: [id number] Name: Paula Lucas  Number of Integrated Behavioral Health Clinician visits:: 1/6 Session Start time: 3:05 PM  Session End time: 3:25 PM Total time: 20 minutes  Type of Service: Integrated Behavioral Health- Individual/Family Interpretor:No. Interpretor Name and Language: N/A   Warm Hand Off Completed.       SUBJECTIVE: Paula Lucas is a 27 y.o. female accompanied by Partner/Significant Other Patient was referred by Dr. Jillyn HiddenFulp for depression and anxiety. Patient reports the following symptoms/concerns: feelings of sadness and worry, difficulty sleeping, low energy, decreased concentration, restlessness, and irritability Duration of problem: 8 years; Severity of problem: moderate  OBJECTIVE: Mood: Anxious and Affect: Appropriate Risk of harm to self or others: No plan to harm self or others  LIFE CONTEXT: Family and Social: Pt receives strong support from partner School/Work: Pt receives family planning medicaid and food stamps Self-Care: Pt smokes cigarettes to cope with stressors Life Changes: Pt has ongoing medical conditions requiring her to be on bed rest for approximately 12 weeks.   GOALS ADDRESSED: Patient will: 1. Reduce symptoms of: anxiety and depression 2. Increase knowledge and/or ability of: coping skills  3. Demonstrate ability to: Increase healthy adjustment to current life circumstances and Increase adequate support systems for patient/family  INTERVENTIONS: Interventions utilized: Mindfulness or Management consultantelaxation Training, Supportive Counseling, Psychoeducation and/or Health Education and Link to [employer]  Standardized Assessments completed: GAD-7 and PHQ 2&9  ASSESSMENT: Patient currently experiencing depression and anxiety triggered by ongoing medical conditions. She reports feelings of sadness and worry, difficulty sleeping, low energy, decreased concentration, restlessness, and  irritability. She receives strong support from partner.   Patient may benefit from psychoeducation. LCSWA educated pt on the correlation between one's physical and mental health, in addition, to how stress can negatively impact health. Healthy coping skills were discussed to manage stressors and decrease symptoms. Supportive resources were provided.    PLAN: 1. Follow up with behavioral health clinician on : Pt was encouraged to contact LCSWA if symptoms worsen or fail to improve to [facility]. 2. Behavioral recommendations: LCSWA recommends that pt apply healthy coping skills discussed. Pt is encouraged to schedule follow up appointment with LCSWA 3. Referral(s): Integrated Art gallery managerBehavioral Health Services (In Clinic) and [employer]:  Food 4. "From scale of 1-10, how likely are you to follow plan?":   Bridgett LarssonJasmine D Lewis, LCSW 04/14/18 5:12 PM

## 2018-04-17 ENCOUNTER — Telehealth: Payer: Self-pay

## 2018-04-17 ENCOUNTER — Telehealth: Payer: Self-pay | Admitting: Family Medicine

## 2018-04-17 NOTE — Telephone Encounter (Signed)
-----   Message from Cammie Fulp, MD sent at 04/14/2018  5:28 PM EDT ----- CBC did not show an acute increase her white blood cell count but she should still complete the antibiotic that she was prescribed when she was discharged from the hospital 

## 2018-04-17 NOTE — Telephone Encounter (Signed)
-----   Message from Cain Saupeammie Fulp, MD sent at 04/14/2018  5:28 PM EDT ----- CBC did not show an acute increase her white blood cell count but she should still complete the antibiotic that she was prescribed when she was discharged from the hospital

## 2018-04-17 NOTE — Telephone Encounter (Signed)
Call was placed to inform patient of most recent lab results, no answer. If patient returns call please inform patient CBC did not show an acute increase her white blood cell count but she should still complete the antibiotic that she was prescribed when she was discharged from the hospital Please notify patient that she is no longer anemic. Her hemoglobin is now normal at 11.2- would like to see it a little higher at 12 or more but per her labs she is no longer anemic at this time.

## 2018-04-17 NOTE — Telephone Encounter (Signed)
Patient returned call, verified DOB, gave results. Patient has no further questions.

## 2018-04-17 NOTE — Telephone Encounter (Signed)
Patient called requesting more patches for her drain because she states she has ran out of them, please follow up with patient.

## 2018-04-18 ENCOUNTER — Other Ambulatory Visit: Payer: Medicaid Other

## 2018-04-18 ENCOUNTER — Telehealth: Payer: Self-pay | Admitting: *Deleted

## 2018-04-18 NOTE — Telephone Encounter (Signed)
Paula Lucas was scheduled to follow up to eval her abscess drain on 04/11/18 she called on 04/10/18 and r/s for 04/13/18. On 04/13/18 she was a no show. She was then r/s for 04/18/18 to arrive at 1:20 she called at 1:45 and said she was on her way on the bus and will be here in the next 15 min. She never arrived and was no showed again.[name]/vm

## 2018-04-19 ENCOUNTER — Encounter: Payer: Medicaid Other | Admitting: Family Medicine

## 2018-05-01 ENCOUNTER — Other Ambulatory Visit: Payer: Self-pay

## 2018-05-01 ENCOUNTER — Ambulatory Visit: Payer: Self-pay | Attending: Family Medicine | Admitting: Family Medicine

## 2018-05-01 ENCOUNTER — Encounter: Payer: Self-pay | Admitting: Family Medicine

## 2018-05-01 VITALS — BP 120/80 | HR 102 | Temp 98.8°F | Resp 18 | Ht 64.0 in | Wt 148.8 lb

## 2018-05-01 DIAGNOSIS — M25552 Pain in left hip: Secondary | ICD-10-CM

## 2018-05-01 DIAGNOSIS — F17210 Nicotine dependence, cigarettes, uncomplicated: Secondary | ICD-10-CM | POA: Insufficient documentation

## 2018-05-01 DIAGNOSIS — R21 Rash and other nonspecific skin eruption: Secondary | ICD-10-CM

## 2018-05-01 MED ORDER — PREDNISONE 20 MG PO TABS: ORAL_TABLET | ORAL | 0 refills | Status: DC

## 2018-05-01 MED ORDER — TRAMADOL HCL 50 MG PO TABS: 50.0000 mg | ORAL_TABLET | Freq: Two times a day (BID) | ORAL | 0 refills | Status: AC | PRN

## 2018-05-01 NOTE — Progress Notes (Signed)
Subjective:    Patient ID: Paula Lucas, female    DOB: 1990/11/18, 27 y.o.   MRN: [id number]  HPI 27 year old female who presents secondary to complaint of recent onset of left hip pain.  Patient states that she has a dull ache in her left hip and now it hurts to sleep on her left hip/side.  Patient states that she had been mostly sleeping on her left side after her hospital discharge for tubo-ovarian abscess status post placement of a drain.  Patient denies any fever or chills.  Patient has not feels as if she has had any swelling or increased warmth of the left hip joint.  Patient denies any back pain.  Patient states that the pain is a dull ache with an occasional sharp sensation.  Pain is about 6-8 on a 0-to-10 scale.  Patient is having difficulty with sleep secondary to the pain in her hip.  Patient recalls no injury to the hip.      Patient also at today's visit states that she has had recent acute breakout of an itchy, bumpy rash on both arms.  Patient states that she has had this rash in the past and it tends to occur when she has been in the sunlight/outdoors for an extended period.  Patient has her children with her at today's visit and patient reports that her young daughter who appears to be a toddler also developed a rash when she is in the sun.  Patient denies fever, chills no insect bites patient states that the rash is itchy.  Patient states that the rash tends to break out in the upper and lower arms at about the same time and does not appear to be moving upper arms.  Patient has had no change in hygiene products such as soaps or lotions, no contact with plants, patient has not eaten or been exposed to any new foods or substances. Past Medical History:  Diagnosis Date  . Asthma   . Asthma    Past Surgical History:  Procedure Laterality Date  . INDUCED ABORTION    . TONSILLECTOMY    Patient status post placement of drain by interventional radiology secondary to Tubo-ovarian  abscess in August of 2019 Family History  Problem Relation Age of Onset  . Diabetes Mother   . Hypertension Mother   . Diabetes Father   . Hypertension Father   . Diabetes Sister   . Hypertension Sister    Social History   Tobacco Use  . Smoking status: Smoker, Current Status Unknown    Packs/day: 0.50    Years: 9.00    Pack years: 4.50    Types: Cigarettes  . Smokeless tobacco: Never Used  Substance Use Topics  . Alcohol use: No  . Drug use: Yes    Types: Marijuana    Comment: THC GUMMIES LAST MONTH    Review of Systems     Objective:   Physical Exam  BP 120/80   Pulse (!) 102   Temp 98.8 F (37.1 C) (Oral)   Resp 18   Ht 5\' 4"  (1.626 m)   Wt 148 lb 12.8 oz (67.5 kg)   LMP 04/06/2018 (Approximate)   SpO2 99%   BMI 25.54 kg/m   Nursing notes and vital signs reviewed General-well-nourished, well-developed young adult female in no acute distress.  Patient is accompanied by her boyfriend and children at today's visit. Lungs-clear to auscultation bilaterally Cardiovascular-regular rate and rhythm Back-no CVA tenderness Abdomen-soft, patient with a drain  to the left lower abdomen and bandaging.  Patient has some mild discomfort around this area but no rebound or guarding Musculoskeletal- patient has some discomfort with palpation along the left outer thigh/greater trochanter area and mild discomfort with flexion of the left thigh but minimal discomfort with internal and external rotation of the thigh/hip Skin- patient with papular rash on both arms some areas which have a slightly red appearance and other areas which appear older and are flesh-colored or hyperpigmented      Assessment & Plan:  1. Left hip pain Patient with left hip pain which she believes is secondary to sleeping predominantly on her left side when she was having issues with adnexal abscess and later with placement of drain.  As patient does have some tenderness on exam, and orders provided for  patient to obtain an x-ray of her right hip.  Patient is given prescription for Ultram.  Patient should call for orthopedic referral if her hip pain is not improving and patient will be notified of x-ray results - DG HIP UNILAT WITH PELVIS 1V LEFT; Future - traMADol (ULTRAM) 50 MG tablet; Take 1 tablet (50 mg total) by mouth every 12 (twelve) hours as needed for up to 15 days.  Dispense: 30 tablet; Refill: 0  2. Rash Patient with complaint of recurrent rash on her arms it tends to occur when she is exposed to sunlight.  Patient will be placed on a prednisone taper and may take over-the-counter Benadryl at bedtime to help with itching.  Patient should call or return if her rash has not improved over the next 2 to 3 days.  Patient should return sooner if rash worsens.  Patient has scarring on her arms as if she has had this rash often over time.  If patient with continued issues, would recommend dermatology referral - predniSONE (DELTASONE) 20 MG tablet; 2 pills on the first day then 1 pill x 2 days then 1/2 pill x 2 days  Dispense: 5 tablet; Refill: 0  *Patient offered but declined influenza immunization at today's visit  An After Visit Summary was printed and given to the patient.  Return if symptoms worsen or fail to improve.

## 2018-05-04 ENCOUNTER — Encounter: Payer: Self-pay | Admitting: Family Medicine

## 2018-05-15 ENCOUNTER — Other Ambulatory Visit: Payer: Self-pay | Admitting: Obstetrics and Gynecology

## 2018-05-15 DIAGNOSIS — N7093 Salpingitis and oophoritis, unspecified: Secondary | ICD-10-CM

## 2018-05-17 ENCOUNTER — Ambulatory Visit
Admission: RE | Admit: 2018-05-17 | Discharge: 2018-05-17 | Disposition: A | Discharge status: Home / Self Care | Payer: Medicaid Other | Source: Ambulatory Visit | Attending: Obstetrics and Gynecology | Admitting: Obstetrics and Gynecology

## 2018-05-17 ENCOUNTER — Encounter: Payer: Self-pay | Admitting: Radiology

## 2018-05-17 DIAGNOSIS — N7093 Salpingitis and oophoritis, unspecified: Secondary | ICD-10-CM

## 2018-05-17 HISTORY — PX: IR RADIOLOGIST EVAL & MGMT: IMG5224

## 2018-05-17 MED ORDER — IOPAMIDOL (ISOVUE-300) INJECTION 61%
100.0000 mL | Freq: Once | INTRAVENOUS | Status: AC | PRN
  Administered 2018-05-17: 100 mL via INTRAVENOUS

## 2018-05-17 NOTE — Progress Notes (Signed)
Chief Complaint: Patient was seen in consultation today for  Chief Complaint  Patient presents with  . Follow-up    Drain follow up     at the request of Conan Bowens  Referring Physician(s): Leroy Libman M  History of Present Illness: Paula Lucas is a 27 y.o. female with a history of complex tubo-ovarian abscess treated by percutaneous drain placement on 04/04/2018.  She has missed several follow-up appointments but presents today for follow-up and drain evaluation.  Last night she reports accidentally rolling onto her drainage catheter which puts tension on the drain and broke her suture.  She has since had intermittent sharp left lower quadrant pains.  She denies fever, chills, nausea, vomiting or other systemic symptoms.  She has not flushed her catheter in several weeks as she has not had any supplies.  She has had no drain output for at least the past week.  She continues her antibiotics.  She is currently on doxycycline.  Overall, other than the new pains related to her drain she is relatively asymptomatic.  Past Medical History:  Diagnosis Date  . Asthma   . Asthma     Past Surgical History:  Procedure Laterality Date  . INDUCED ABORTION    . IR RADIOLOGIST EVAL & MGMT  05/17/2018  . TONSILLECTOMY      Allergies: Fish allergy; Ibuprofen; Mushroom extract complex; and Tylenol [acetaminophen]  Medications: Prior to Admission medications   Medication Sig Start Date End Date Taking? Authorizing Provider  doxycycline (DORYX) 100 MG EC tablet Take 100 mg by mouth.   Yes [provider]  hydrOXYzine (ATARAX/VISTARIL) 25 MG tablet Take 1 tablet (25 mg total) by mouth every 6 (six) hours. 02/17/18  Yes Khatri, Hina, PA-C  naproxen (NAPROSYN) 375 MG tablet Take 750 mg by mouth as needed for mild pain.   Yes [provider]  oxyCODONE (ROXICODONE) 5 MG immediate release tablet Take 1 tablet (5 mg total) by mouth every 4 (four) hours as needed for severe  pain. Patient not taking: Reported on 04/13/2018 04/07/18   Conan Bowens, MD     Family History  Problem Relation Age of Onset  . Diabetes Mother   . Hypertension Mother   . Diabetes Father   . Hypertension Father   . Diabetes Sister   . Hypertension Sister     Social History   Socioeconomic History  . Marital status: Single    Spouse name: Not on file  . Number of children: Not on file  . Years of education: Not on file  . Highest education level: Not on file  Occupational History  . Occupation: UNEMPLOYED  Social Needs  . Financial resource strain: Somewhat hard  . Food insecurity:    Worry: Sometimes true    Inability: Sometimes true  . Transportation needs:    Medical: Yes    Non-medical: Yes  Tobacco Use  . Smoking status: Smoker, Current Status Unknown    Packs/day: 0.50    Years: 9.00    Pack years: 4.50    Types: Cigarettes  . Smokeless tobacco: Never Used  Substance and Sexual Activity  . Alcohol use: No  . Drug use: Yes    Types: Marijuana    Comment: THC GUMMIES LAST MONTH  . Sexual activity: Yes  Lifestyle  . Physical activity:    Days per week: 1 day    Minutes per session: 120 min  . Stress: Not at all  Relationships  . Social  connections:    Talks on phone: Once a week    Gets together: Once a week    Attends religious service: Not on file    Active member of club or organization: No    Attends meetings of clubs or organizations: Never    Relationship status: Never married  Other Topics Concern  . Not on file  Social History Narrative  . Not on file     Review of Systems: A 12 point ROS discussed and pertinent positives are indicated in the HPI above.  All other systems are negative.  Review of Systems  Vital Signs: BP 112/84   Pulse 75   Temp 98.1 F (36.7 C) (Oral)   Resp 14   Ht 5\' 1"  (1.549 m)   Wt 68 kg   SpO2 100%   BMI 28.34 kg/m   Physical Exam  Constitutional: She is oriented to person, place, and time. She  appears well-developed and well-nourished. No distress.  HENT:  Head: Normocephalic and atraumatic.  Eyes: Left eye exhibits no discharge.  Cardiovascular: Normal rate and regular rhythm.  Pulmonary/Chest: Effort normal.  Abdominal: Soft. She exhibits no distension. There is no tenderness.    Neurological: She is alert and oriented to person, place, and time.  Skin: Skin is warm and dry.  Psychiatric: She has a normal mood and affect. Her behavior is normal.  Nursing note and vitals reviewed.    Imaging: Ct Abdomen Pelvis W Contrast  Result Date: 05/17/2018 CLINICAL DATA:  27 year old female with a history of left tubo-ovarian abscess status post percutaneous drain placement on 04/04/2018. She presents today for follow-up imaging. She recently rolled under her drainage tube placing tension on the tube and resulting in moderate left lower quadrant pain. She is currently asymptomatic and afebrile and is flushing her tube. Drain output has been insignificant for many days. She continues to take doxycycline. EXAM: CT ABDOMEN AND PELVIS WITH CONTRAST TECHNIQUE: Multidetector CT imaging of the abdomen and pelvis was performed using the standard protocol following bolus administration of intravenous contrast. CONTRAST:  ISOVUE-300 IOPAMIDOL (ISOVUE-300) INJECTION 61% COMPARISON:  Prior CT scan of the abdomen and pelvis 04/01/2018 FINDINGS: Lower chest: No acute abnormality. Hepatobiliary: No focal liver abnormality is seen. No gallstones, gallbladder wall thickening, or biliary dilatation. Pancreas: Unremarkable. No pancreatic ductal dilatation or surrounding inflammatory changes. Spleen: Normal in size without focal abnormality. Adrenals/Urinary Tract: Adrenal glands are unremarkable. Kidneys are normal, without renal calculi, focal lesion, or hydronephrosis. Bladder is unremarkable. Stomach/Bowel: Stomach is within normal limits. Appendix appears normal. No evidence of bowel wall thickening,  distention, or inflammatory changes. Vascular/Lymphatic: No significant vascular findings are present. No enlarged abdominal or pelvic lymph nodes. Reproductive: The uterus remains unremarkable in appearance. The right ovary is unremarkable with interval resolution of the right ovarian cyst and right parovarian fluid collection. The drainage catheter remains within good position in a left ovarian cyst which has significantly decreased in size now measuring approximately 3.2 x 2.3 cm compared to 5.5 x 4.3 cm previously. No significant surrounding inflammatory change. Other: No abdominal wall hernia or abnormality. No abdominopelvic ascites. Musculoskeletal: No acute fracture or aggressive appearing lytic or blastic osseous lesion. IMPRESSION: 1. Well-positioned drainage catheter within the significantly reduced left ovarian cyst/abscess. The residual fluid collection does not demonstrate any significant surrounding inflammatory changes and may represent a residual follicular cyst. 2. Interval resolution of right ovarian cyst/abscess and complex pelvic free fluid and associated inflammatory changes. 3. No new or acute abnormality.  Electronically Signed   By: Malachy Moan M.D.   On: 05/17/2018 13:41   Ir Radiologist Eval & Mgmt  Result Date: 05/17/2018 Please refer to notes tab for details about interventional procedure. (Op Note)   Labs:  CBC: Recent Labs    03/06/18 1402 04/01/18 1424 04/02/18 0557 04/13/18 1512  WBC 12.8* 12.1* 10.2 8.7  HGB 12.8 12.8 10.2* 11.2  HCT 38.5 38.8 29.6* 35.0  PLT 333 295 220 437    COAGS: Recent Labs    04/03/18 1604  INR 1.09    BMP: Recent Labs    01/23/18 1342 03/06/18 1402 04/01/18 1424  NA 142 143 140  K 3.6 3.6 4.0  CL 105 109 112*  CO2 27 26 21*  GLUCOSE 100* 103* 100*  BUN 9 9 8   CALCIUM 9.1 9.5 8.9  CREATININE 1.01* 0.78 0.80  GFRNONAA >60 >60 >60  GFRAA >60 >60 >60    LIVER FUNCTION TESTS: Recent Labs    01/23/18 1342  03/06/18 1402 04/01/18 1424  BILITOT 0.5 0.8 0.5  AST 15 20 19   ALT 9* 12 11  ALKPHOS 54 60 49  PROT 7.5 8.4* 7.8  ALBUMIN 3.8 4.1 3.9    TUMOR MARKERS: No results for input(s): AFPTM, CEA, CA199, CHROMGRNA in the last 8760 hours.  Assessment and Plan:  CT imaging today demonstrates significant improvement in the previously noted pelvic inflammatory disease and complex multifocal tubo-ovarian abscesses.  There may be a residual follicular cyst in the left ovary in the region of the percutaneous drainage catheter.  However, the drain output has been near 04 nearly a week.  The patient remains clinically asymptomatic.  At this point in time, the drainage catheter offers no additional benefit.  She remains on doxycycline.  1.)  the percutaneous drainage catheter was removed.   Electronically Signed: Malachy Moan 05/17/2018, 1:48 PM   I spent a total of  15 Minutes in face to face in clinical consultation, greater than 50% of which was counseling/coordinating care for presence of percutaneous drain.

## 2018-05-18 ENCOUNTER — Encounter: Payer: Self-pay | Admitting: Obstetrics and Gynecology

## 2018-05-18 NOTE — Progress Notes (Signed)
Patient now s/p drain for TOA. Will have her follow up in office.

## 2018-05-23 ENCOUNTER — Ambulatory Visit: Payer: Medicaid Other | Admitting: Obstetrics & Gynecology

## 2018-05-29 ENCOUNTER — Ambulatory Visit: Payer: Self-pay | Admitting: Obstetrics and Gynecology

## 2018-06-19 ENCOUNTER — Other Ambulatory Visit: Payer: Self-pay

## 2018-06-19 ENCOUNTER — Inpatient Hospital Stay (HOSPITAL_COMMUNITY)
Admission: EM | Admit: 2018-06-19 | Discharge: 2018-06-25 | DRG: 759 | Disposition: A | Discharge status: Home / Self Care | Payer: Self-pay | Attending: Obstetrics & Gynecology | Admitting: Obstetrics & Gynecology

## 2018-06-19 ENCOUNTER — Encounter (HOSPITAL_COMMUNITY): Payer: Self-pay | Admitting: Emergency Medicine

## 2018-06-19 ENCOUNTER — Emergency Department (HOSPITAL_COMMUNITY): Payer: Self-pay

## 2018-06-19 DIAGNOSIS — N7093 Salpingitis and oophoritis, unspecified: Principal | ICD-10-CM | POA: Diagnosis present

## 2018-06-19 LAB — COMPREHENSIVE METABOLIC PANEL
ALT: 11 U/L (ref 0–44)
AST: 32 U/L (ref 15–41)
Albumin: 4.4 g/dL (ref 3.5–5.0)
Alkaline Phosphatase: 54 U/L (ref 38–126)
Anion gap: 10 (ref 5–15)
BUN: 11 mg/dL (ref 6–20)
CALCIUM: 9.6 mg/dL (ref 8.9–10.3)
CO2: 22 mmol/L (ref 22–32)
CREATININE: 0.82 mg/dL (ref 0.44–1.00)
Chloride: 106 mmol/L (ref 98–111)
GFR calc Af Amer: >60 mL/min (ref >60)
GFR calc non Af Amer: >60 mL/min (ref >60)
Glucose, Bld: 118 mg/dL — ABNORMAL HIGH (ref 70–99)
Potassium: 5.1 mmol/L (ref 3.5–5.1)
SODIUM: 138 mmol/L (ref 135–145)
TOTAL PROTEIN: 8.5 g/dL — AB (ref 6.5–8.1)
Total Bilirubin: 1.7 mg/dL — ABNORMAL HIGH (ref 0.3–1.2)

## 2018-06-19 LAB — CBC
HCT: 40.1 % (ref 36.0–46.0)
Hemoglobin: 13 g/dL (ref 12.0–15.0)
MCH: 30 pg (ref 26.0–34.0)
MCHC: 32.4 g/dL (ref 30.0–36.0)
MCV: 92.6 fL (ref 80.0–100.0)
Platelets: 336 10*3/uL (ref 150–400)
RBC: 4.33 MIL/uL (ref 3.87–5.11)
RDW: 15.5 % (ref 11.5–15.5)
WBC: 18.9 10*3/uL — ABNORMAL HIGH (ref 4.0–10.5)
nRBC: 0 % (ref 0.0–0.2)

## 2018-06-19 LAB — WET PREP, GENITAL
Sperm: NONE SEEN
Trich, Wet Prep: NONE SEEN

## 2018-06-19 LAB — LIPASE, BLOOD: Lipase: 26 U/L (ref 11–51)

## 2018-06-19 LAB — I-STAT BETA HCG BLOOD, ED (MC, WL, AP ONLY)

## 2018-06-19 MED ORDER — OXYCODONE HCL 5 MG PO TABS
5.0000 mg | ORAL_TABLET | Freq: Four times a day (QID) | ORAL | Status: DC | PRN
  Administered 2018-06-19 – 2018-06-21 (×5): 10 mg via ORAL
  Filled 2018-06-19 (×5): qty 2

## 2018-06-19 MED ORDER — MORPHINE SULFATE (PF) 4 MG/ML IV SOLN
6.0000 mg | Freq: Once | INTRAVENOUS | Status: AC
  Administered 2018-06-19: 6 mg via INTRAVENOUS
  Filled 2018-06-19: qty 2

## 2018-06-19 MED ORDER — ONDANSETRON HCL 4 MG PO TABS
4.0000 mg | ORAL_TABLET | Freq: Four times a day (QID) | ORAL | Status: DC | PRN
  Filled 2018-06-19: qty 1

## 2018-06-19 MED ORDER — METRONIDAZOLE IN NACL 5-0.79 MG/ML-% IV SOLN: 500.0000 mg | Freq: Once | INTRAVENOUS | Status: DC

## 2018-06-19 MED ORDER — SODIUM CHLORIDE 0.9 % IV SOLN
100.0000 mg | Freq: Two times a day (BID) | INTRAVENOUS | Status: DC
  Administered 2018-06-20 – 2018-06-23 (×8): 100 mg via INTRAVENOUS
  Filled 2018-06-19 (×9): qty 100

## 2018-06-19 MED ORDER — HYDROMORPHONE HCL 1 MG/ML IJ SOLN
1.0000 mg | Freq: Once | INTRAMUSCULAR | Status: AC
  Administered 2018-06-19: 1 mg via INTRAVENOUS
  Filled 2018-06-19: qty 1

## 2018-06-19 MED ORDER — SODIUM CHLORIDE 0.9 % IV SOLN: 100.0000 mg | Freq: Two times a day (BID) | INTRAVENOUS | Status: DC

## 2018-06-19 MED ORDER — SODIUM CHLORIDE 0.9 % IV SOLN
100.0000 mg | Freq: Once | INTRAVENOUS | Status: AC
  Administered 2018-06-19: 100 mg via INTRAVENOUS
  Filled 2018-06-19: qty 100

## 2018-06-19 MED ORDER — ONDANSETRON HCL 4 MG PO TABS: 4.0000 mg | ORAL_TABLET | Freq: Four times a day (QID) | ORAL | Status: DC | PRN

## 2018-06-19 MED ORDER — SODIUM CHLORIDE 0.9 % IV SOLN
2.0000 g | INTRAVENOUS | Status: DC
  Filled 2018-06-19: qty 20

## 2018-06-19 MED ORDER — SODIUM CHLORIDE 0.9 % IV SOLN
100.0000 mg | Freq: Two times a day (BID) | INTRAVENOUS | Status: DC
  Filled 2018-06-19: qty 100

## 2018-06-19 MED ORDER — SENNOSIDES-DOCUSATE SODIUM 8.6-50 MG PO TABS: 1.0000 | ORAL_TABLET | Freq: Every evening | ORAL | Status: DC | PRN

## 2018-06-19 MED ORDER — ONDANSETRON HCL 4 MG/2ML IJ SOLN
4.0000 mg | Freq: Once | INTRAMUSCULAR | Status: AC
  Administered 2018-06-19: 4 mg via INTRAVENOUS
  Filled 2018-06-19: qty 2

## 2018-06-19 MED ORDER — ONDANSETRON HCL 4 MG/2ML IJ SOLN
4.0000 mg | Freq: Four times a day (QID) | INTRAMUSCULAR | Status: DC | PRN
  Administered 2018-06-19 – 2018-06-21 (×2): 4 mg via INTRAVENOUS

## 2018-06-19 MED ORDER — SODIUM CHLORIDE 0.9 % IV SOLN
2.0000 g | INTRAVENOUS | Status: DC
  Administered 2018-06-20: 2 g via INTRAVENOUS
  Filled 2018-06-19: qty 2

## 2018-06-19 MED ORDER — SODIUM CHLORIDE 0.9 % IV SOLN
INTRAVENOUS | Status: AC
  Administered 2018-06-19: 15:00:00 via INTRAVENOUS

## 2018-06-19 MED ORDER — IOPAMIDOL (ISOVUE-300) INJECTION 61%
100.0000 mL | Freq: Once | INTRAVENOUS | Status: AC | PRN
  Administered 2018-06-19: 100 mL via INTRAVENOUS

## 2018-06-19 MED ORDER — IOPAMIDOL (ISOVUE-300) INJECTION 61%
INTRAVENOUS | Status: AC
  Filled 2018-06-19: qty 100

## 2018-06-19 MED ORDER — LACTATED RINGERS IV SOLN: INTRAVENOUS | Status: DC

## 2018-06-19 MED ORDER — SODIUM CHLORIDE 0.9 % IV SOLN
INTRAVENOUS | Status: DC
  Administered 2018-06-19 – 2018-06-25 (×10): via INTRAVENOUS

## 2018-06-19 MED ORDER — SODIUM CHLORIDE 0.9 % IJ SOLN
INTRAMUSCULAR | Status: AC
  Filled 2018-06-19: qty 50

## 2018-06-19 MED ORDER — HYDROMORPHONE HCL 1 MG/ML IJ SOLN
1.0000 mg | INTRAMUSCULAR | Status: DC | PRN
  Administered 2018-06-19 – 2018-06-23 (×10): 1 mg via INTRAVENOUS
  Filled 2018-06-19 (×13): qty 1

## 2018-06-19 MED ORDER — SODIUM CHLORIDE 0.9 % IV BOLUS
1000.0000 mL | Freq: Once | INTRAVENOUS | Status: AC
  Administered 2018-06-19: 1000 mL via INTRAVENOUS

## 2018-06-19 MED ORDER — ALUM & MAG HYDROXIDE-SIMETH 200-200-20 MG/5ML PO SUSP: 30.0000 mL | ORAL | Status: DC | PRN

## 2018-06-19 MED ORDER — PRENATAL MULTIVITAMIN CH: 1.0000 | ORAL_TABLET | Freq: Every day | ORAL | Status: DC

## 2018-06-19 MED ORDER — ZOLPIDEM TARTRATE 5 MG PO TABS
5.0000 mg | ORAL_TABLET | Freq: Every evening | ORAL | Status: DC | PRN
  Administered 2018-06-23 – 2018-06-25 (×3): 5 mg via ORAL
  Filled 2018-06-19 (×3): qty 1

## 2018-06-19 MED ORDER — SODIUM CHLORIDE 0.9 % IV SOLN
1.0000 g | Freq: Once | INTRAVENOUS | Status: AC
  Administered 2018-06-19: 1 g via INTRAVENOUS
  Filled 2018-06-19: qty 10

## 2018-06-19 MED ORDER — ONDANSETRON HCL 4 MG/2ML IJ SOLN
4.0000 mg | Freq: Four times a day (QID) | INTRAMUSCULAR | Status: DC | PRN
  Filled 2018-06-19 (×2): qty 2

## 2018-06-19 MED ORDER — TRAMADOL HCL 50 MG PO TABS
50.0000 mg | ORAL_TABLET | Freq: Four times a day (QID) | ORAL | Status: DC | PRN
  Filled 2018-06-19: qty 1

## 2018-06-19 MED ORDER — SODIUM CHLORIDE 0.9 % IV SOLN: 2.0000 g | INTRAVENOUS | Status: DC

## 2018-06-19 NOTE — ED Notes (Addendum)
Provided patient ginger ale with ice. Permission given by Dr. Anitra Lauth. While in the room, patient stated her pain was so bad, she was having to fall a sleep. When entering the room to change her antibiotics, she was resting with eyes closed.

## 2018-06-19 NOTE — ED Notes (Signed)
[place] tech came to this RN and states " I attempted to do trans abd [place] and the pt was unable to tolerate due to the pain. Pt is in tears" MD made aware. Per MD: Please wait 15 mins for medication to take affect and try to complete exam. If pt still unable to tolerate we can medicate more.

## 2018-06-19 NOTE — ED Notes (Signed)
Bed: WU98 Expected date:  Expected time:  Means of arrival:  Comments: EMS ovarian cyst?

## 2018-06-19 NOTE — ED Provider Notes (Signed)
[facility]-EMERGENCY DEPT Provider Note   CSN: [id number] Arrival date & time: 06/19/18  0801     History   Chief Complaint Chief Complaint  Patient presents with  . Abdominal Pain    HPI Paula Lucas is a 27 y.o. female.  The history is provided by the patient.  Abdominal Pain   This is a new problem. Episode onset: 1 week ago. The problem occurs constantly. The problem has been rapidly worsening. The pain is associated with an unknown factor. The pain is located in the RLQ. The quality of the pain is colicky, shooting, sharp and throbbing. The pain is at a severity of 10/10. The pain is severe. Associated symptoms include anorexia, fever, nausea and vomiting. Pertinent negatives include diarrhea, constipation, dysuria and frequency. Associated symptoms comments: Brown vaginal discharge.  Patient is currently sexually active.  She does not use protection.  She was treated for chlamydia and gonorrhea while in the hospital and her partner was checked and cleared for any infections.  She has started having a brown discharge in the last day as well as vomiting, chills and worsening pain except this time is in the right lower quadrant not the left lower quadrant. The symptoms are aggravated by certain positions and activity. Nothing relieves the symptoms. Past workup comments: had drained placed in the LLQ for TOA in aug.  tube removed about 3 weeks ago. Past medical history comments: TOA requiring hospitalization and IR drainage.    Past Medical History:  Diagnosis Date  . Asthma   . Asthma     Patient Active Problem List   Diagnosis Date Noted  . Neisseria gonorrheae 03/06/2018 04/02/2018  . TOA (tubo-ovarian abscess) 04/01/2018  . BV (bacterial vaginosis) 04/01/2018  . Left ovarian cyst 04/16/2011    Past Surgical History:  Procedure Laterality Date  . INDUCED ABORTION    . IR RADIOLOGIST EVAL & MGMT  05/17/2018  . TONSILLECTOMY       OB History    Gravida  1   Para  0   Term  0   Preterm  0   AB  1   Living  0     SAB  0   TAB  1   Ectopic  0   Multiple  0   Live Births               Home Medications    Prior to Admission medications   Medication Sig Start Date End Date Taking? Authorizing Provider  hydrOXYzine (ATARAX/VISTARIL) 25 MG tablet Take 1 tablet (25 mg total) by mouth every 6 (six) hours. Patient not taking: Reported on 06/19/2018 02/17/18   Dietrich Pates, PA-C  oxyCODONE (ROXICODONE) 5 MG immediate release tablet Take 1 tablet (5 mg total) by mouth every 4 (four) hours as needed for severe pain. Patient not taking: Reported on 04/13/2018 04/07/18   Conan Bowens, MD    Family History Family History  Problem Relation Age of Onset  . Diabetes Mother   . Hypertension Mother   . Diabetes Father   . Hypertension Father   . Diabetes Sister   . Hypertension Sister     Social History Social History   Tobacco Use  . Smoking status: Smoker, Current Status Unknown    Packs/day: 0.50    Years: 9.00    Pack years: 4.50    Types: Cigarettes  . Smokeless tobacco: Never Used  Substance Use Topics  . Alcohol use: No  .  Drug use: Yes    Types: Marijuana    Comment: THC GUMMIES LAST MONTH     Allergies   Fish allergy; Ibuprofen; Mushroom extract complex; and Tylenol [acetaminophen]   Review of Systems Review of Systems  Constitutional: Positive for fever.  Gastrointestinal: Positive for abdominal pain, anorexia, nausea and vomiting. Negative for constipation and diarrhea.  Genitourinary: Negative for dysuria and frequency.  All other systems reviewed and are negative.    Physical Exam Updated Vital Signs BP (!) 120/101 (BP Location: Left Arm)   Pulse 95   Temp 98.3 F (36.8 C) (Oral)   Resp 18   SpO2 100%   Physical Exam  Constitutional: She is oriented to person, place, and time. She appears well-developed and well-nourished. She appears distressed.  Appears uncomfortable    HENT:  Head: Normocephalic and atraumatic.  Mouth/Throat: Oropharynx is clear and moist.  Eyes: Pupils are equal, round, and reactive to light. Conjunctivae and EOM are normal.  Neck: Normal range of motion. Neck supple.  Cardiovascular: Regular rhythm and intact distal pulses. Tachycardia present.  No murmur heard. Pulmonary/Chest: Effort normal and breath sounds normal. No respiratory distress. She has no wheezes. She has no rales.  Abdominal: Soft. She exhibits no distension. There is tenderness in the right lower quadrant and suprapubic area. There is guarding. There is no rebound and no CVA tenderness.  Genitourinary: Uterus is tender. Cervix exhibits motion tenderness and discharge. Right adnexum displays tenderness. Left adnexum displays tenderness. There is tenderness in the vagina. Vaginal discharge found.  Genitourinary Comments: 2 gauze pads present in the vaginal vault  Musculoskeletal: Normal range of motion. She exhibits no edema or tenderness.  Neurological: She is alert and oriented to person, place, and time.  Skin: Skin is warm and dry. Capillary refill takes less than 2 seconds. No rash noted. No erythema.  Psychiatric: She has a normal mood and affect. Her behavior is normal.  Nursing note and vitals reviewed.    ED Treatments / Results  Labs (all labs ordered are listed, but only abnormal results are displayed) Labs Reviewed  COMPREHENSIVE METABOLIC PANEL - Abnormal; Notable for the following components:      Result Value   Glucose, Bld 118 (*)    Total Protein 8.5 (*)    Total Bilirubin 1.7 (*)    All other components within normal limits  CBC - Abnormal; Notable for the following components:   WBC 18.9 (*)    All other components within normal limits  WET PREP, GENITAL  LIPASE, BLOOD  URINALYSIS, ROUTINE W REFLEX MICROSCOPIC  I-STAT BETA HCG BLOOD, ED ([facility], [facility], [facility] ONLY)  GC/CHLAMYDIA PROBE AMP ([place]) NOT AT [facility]    EKG None  Radiology US  Transvaginal Non-ob  Result Date: 06/19/2018 CLINICAL DATA:  Right lower quadrant pain EXAM: TRANSABDOMINAL AND TRANSVAGINAL ULTRASOUND OF PELVIS DOPPLER ULTRASOUND OF OVARIES TECHNIQUE: Both transabdominal and transvaginal ultrasound examinations of the pelvis were performed. Transabdominal technique was performed for global imaging of the pelvis including uterus, ovaries, adnexal regions, and pelvic cul-de-sac. It was necessary to proceed with endovaginal exam following the transabdominal exam to visualize the uterus, endometrium, ovaries and adnexa. Color and duplex Doppler ultrasound was utilized to evaluate blood flow to the ovaries. COMPARISON:  CT 05/17/2018 FINDINGS: Uterus Measurements: 8.1 x 3.6 x 4.1 cm. No fibroids or other mass visualized. Endometrium Thickness: Normal thickness, 10 mm. No focal abnormality visualized. Right ovary Measurements: 6.0 x 4.1 x 2.9 cm.  2 cm follicle.  No adnexal mass. Left ovary Measurements: 5.5 x 3.6 x 4.8 cm. 2.9 cm cystic area with internal echoes, likely small hemorrhagic cyst. Pulsed Doppler evaluation of both ovaries demonstrates normal low-resistance arterial and venous waveforms. Other findings Small amount of free fluid in the pelvis. IMPRESSION: Probable small hemorrhagic cyst in the left ovary, 2.9 cm. No evidence of ovarian torsion. Electronically Signed   By: Charlett Nose M.D.   On: 06/19/2018 11:09   US Pelvis Complete  Result Date: 06/19/2018 CLINICAL DATA:  Right lower quadrant pain EXAM: TRANSABDOMINAL AND TRANSVAGINAL ULTRASOUND OF PELVIS DOPPLER ULTRASOUND OF OVARIES TECHNIQUE: Both transabdominal and transvaginal ultrasound examinations of the pelvis were performed. Transabdominal technique was performed for global imaging of the pelvis including uterus, ovaries, adnexal regions, and pelvic cul-de-sac. It was necessary to proceed with endovaginal exam following the transabdominal exam to visualize the uterus, endometrium, ovaries and adnexa.  Color and duplex Doppler ultrasound was utilized to evaluate blood flow to the ovaries. COMPARISON:  CT 05/17/2018 FINDINGS: Uterus Measurements: 8.1 x 3.6 x 4.1 cm. No fibroids or other mass visualized. Endometrium Thickness: Normal thickness, 10 mm. No focal abnormality visualized. Right ovary Measurements: 6.0 x 4.1 x 2.9 cm.  2 cm follicle.  No adnexal mass. Left ovary Measurements: 5.5 x 3.6 x 4.8 cm. 2.9 cm cystic area with internal echoes, likely small hemorrhagic cyst. Pulsed Doppler evaluation of both ovaries demonstrates normal low-resistance arterial and venous waveforms. Other findings Small amount of free fluid in the pelvis. IMPRESSION: Probable small hemorrhagic cyst in the left ovary, 2.9 cm. No evidence of ovarian torsion. Electronically Signed   By: Charlett Nose M.D.   On: 06/19/2018 11:09   Ct Abdomen Pelvis W Contrast  Result Date: 06/19/2018 CLINICAL DATA:  Worsening right lower quadrant pain for a week. Abscess drainage in the left lower quadrant 1 month ago. EXAM: CT ABDOMEN AND PELVIS WITH CONTRAST TECHNIQUE: Multidetector CT imaging of the abdomen and pelvis was performed using the standard protocol following bolus administration of intravenous contrast. CONTRAST:  ISOVUE-300 IOPAMIDOL (ISOVUE-300) INJECTION 61% COMPARISON:  Pelvic ultrasound 06/19/2018 and CT abdomen pelvis 05/17/2018. FINDINGS: Lower chest: Lung bases show no acute findings. Heart size normal. No pericardial or pleural effusion. Distal esophagus is grossly unremarkable. Hepatobiliary: Liver and gallbladder are unremarkable. No biliary ductal dilatation. Pancreas: Negative. Spleen: Negative. Adrenals/Urinary Tract: Adrenal glands and kidneys are unremarkable. Ureters are decompressed. Bladder is unremarkable. Stomach/Bowel: Stomach, small bowel and colon are unremarkable. Appendix may be minimally thick-walled and dilated, measuring 9 mm (series 2, image 54). No surrounding. No periappendiceal stranding.  Vascular/Lymphatic: Vascular structures are unremarkable. Retroperitoneal lymph nodes measure up to 10 mm in the left periaortic station, similar. Reproductive: Uterus is visualized. There is a complex fluid collection in the right adnexa with a dilated, thick-walled tubular structure (series 2, images 60-63), likely representing the right fallopian tube. Right ovary is not well seen. Other: Moderate pelvic free fluid with peritoneal hyper attenuation. Mild lower mesenteric edema. Musculoskeletal: No worrisome lytic or sclerotic lesions. IMPRESSION: 1. Findings in the right adnexa are worrisome for hydrosalpinx/pyosalpinx. Associated complex fluid in the anatomic pelvis with evidence of peritonitis. Tubo-ovarian abscess is not excluded. 2. Moderate pelvic ascites. 3. There may be mild secondary appendiceal inflammation. Electronically Signed   By: Leanna Battles M.D.   On: 06/19/2018 13:42   [place] Art/ven Flow Abd Pelv Doppler  Result Date: 06/19/2018 CLINICAL DATA:  Right lower quadrant pain EXAM: TRANSABDOMINAL AND TRANSVAGINAL ULTRASOUND OF PELVIS DOPPLER ULTRASOUND OF  OVARIES TECHNIQUE: Both transabdominal and transvaginal ultrasound examinations of the pelvis were performed. Transabdominal technique was performed for global imaging of the pelvis including uterus, ovaries, adnexal regions, and pelvic cul-de-sac. It was necessary to proceed with endovaginal exam following the transabdominal exam to visualize the uterus, endometrium, ovaries and adnexa. Color and duplex Doppler ultrasound was utilized to evaluate blood flow to the ovaries. COMPARISON:  CT 05/17/2018 FINDINGS: Uterus Measurements: 8.1 x 3.6 x 4.1 cm. No fibroids or other mass visualized. Endometrium Thickness: Normal thickness, 10 mm. No focal abnormality visualized. Right ovary Measurements: 6.0 x 4.1 x 2.9 cm.  2 cm follicle.  No adnexal mass. Left ovary Measurements: 5.5 x 3.6 x 4.8 cm. 2.9 cm cystic area with internal echoes, likely small  hemorrhagic cyst. Pulsed Doppler evaluation of both ovaries demonstrates normal low-resistance arterial and venous waveforms. Other findings Small amount of free fluid in the pelvis. IMPRESSION: Probable small hemorrhagic cyst in the left ovary, 2.9 cm. No evidence of ovarian torsion. Electronically Signed   By: Charlett Nose M.D.   On: 06/19/2018 11:09    Procedures Procedures (including critical care time)  Medications Ordered in ED Medications  morphine 4 MG/ML injection 6 mg (has no administration in time range)  ondansetron (ZOFRAN) injection 4 mg (has no administration in time range)  sodium chloride 0.9 % bolus 1,000 mL (has no administration in time range)  0.9 %  sodium chloride infusion (has no administration in time range)     Initial Impression / Assessment and Plan / ED Course  I have reviewed the triage vital signs and the nursing notes.  Pertinent labs & imaging results that were available during my care of the patient were reviewed by me and considered in my medical decision making (see chart for details).  Patient is a 27 year old female with a recent history of tubo-ovarian abscess on the left resulting in IV antibiotics, hospitalization and drainage tube.  This was caused from gonorrhea and patient was treated.  Patient states her partner was also checked and treated.  She had the drainage tube removed 3 weeks ago and was doing well until 1 week ago when she started having pain in the right lower quadrant.  In the last 24 hours she is developed nausea, vomiting, chills and a brownish discharge.  She states the pain in her right lower quadrant feel exactly like the pain she had in her left lower quadrant.  On exam she has guarding in the right lower quadrant and seems to be significantly uncomfortable.  Pelvic exam with GC chlamydia cultures and wet prep pending.  CBC with a leukocytosis of 18,000, pregnancy test is negative and CMP is pending.  We will do an ultrasound to  further evaluate.  11:33 AM CMP wnl.  [place] neg for acute ovarian pathology or TOA.  Pt still in significant pain and will get CT to further eval.  2:04 PM CT with concern for tubo-ovarian abscess.  On pelvic exam to gauze were removed from the vaginal vault vault with malodorous discharge.  Concerned this may reinfected her.  She was positive for chlamydia and gonorrhea.  Spoke with Dr. Debroah Loop who will directly admit the patient.  Will cover with Rocephin and doxycycline at this time  Final Clinical Impressions(s) / ED Diagnoses   Final diagnoses:  TOA (tubo-ovarian abscess)    ED Discharge Orders    None       Gwyneth Sprout, MD 06/19/18 1405

## 2018-06-19 NOTE — Progress Notes (Addendum)
Patient called out in pain stating she could not breathe. Patients O2 was 100 and vital were WNL. After sitting with patient for 5 minutes she calmed down and could breathe normal.   Temperature was elevated but due to allergies patient cannot receive tylenol or motrin. Will continue to monitor. Dr. Alysia Penna aware.

## 2018-06-19 NOTE — H&P (Signed)
Paula Lucas is an 27 y.o. female. G1P0010 No LMP recorded. She presented to [facility] today and was transferred for admission. This is a new problem. Episode onset: 1 week ago. The problem occurs constantly. The problem has been rapidly worsening. The pain is associated with an unknown factor. The pain is located in the RLQ. The quality of the pain is colicky, shooting, sharp and throbbing. The pain is at a severity of 10/10. The pain is severe. Associated symptoms include anorexia, fever, nausea and vomiting. Pertinent negatives include diarrhea, constipation, dysuria and frequency. Associated symptoms comments: Brown vaginal discharge.  Patient is currently sexually active.  She does not use protection.  She was treated for chlamydia and gonorrhea while in the hospital and her partner was checked and cleared for any infections.  She has started having a brown discharge in the last day as well as vomiting, chills and worsening pain except this time is in the right lower quadrant not the left lower quadrant. The symptoms are aggravated by certain positions and activity. Nothing relieves the symptoms. Past workup comments: had drained placed in the LLQ for TOA in aug.  tube removed about 5 weeks ago. Past medical history comments: TOA requiring hospitalization and IR drainage.    Pertinent Gynecological History:  Bleeding: none current Sexually transmitted diseases: recent diagnosis: PID, GC and chlamydia Previous GYN Procedures: drainage of abscess     Menstrual History:  No LMP recorded.    Past Medical History:  Diagnosis Date  . Asthma   . Asthma     Past Surgical History:  Procedure Laterality Date  . INDUCED ABORTION    . IR RADIOLOGIST EVAL & MGMT  05/17/2018  . TONSILLECTOMY      Family History  Problem Relation Age of Onset  . Diabetes Mother   . Hypertension Mother   . Diabetes Father   . Hypertension Father   . Diabetes Sister   . Hypertension Sister     Social  History:  reports that she has been smoking cigarettes. She has a 4.50 pack-year smoking history. She has never used smokeless tobacco. She reports that she has current or past drug history. Drug: Marijuana. She reports that she does not drink alcohol.  Allergies:  Allergies  Allergen Reactions  . Fish Allergy Anaphylaxis    Severe allergic reaction to all seafoods  . Ibuprofen Anaphylaxis  . Mushroom Extract Complex Anaphylaxis  . Tylenol [Acetaminophen] Anaphylaxis    Medications Prior to Admission  Medication Sig Dispense Refill Last Dose  . hydrOXYzine (ATARAX/VISTARIL) 25 MG tablet Take 1 tablet (25 mg total) by mouth every 6 (six) hours. (Patient not taking: Reported on 06/19/2018) 12 tablet 0 Completed Course at Unknown time  . oxyCODONE (ROXICODONE) 5 MG immediate release tablet Take 1 tablet (5 mg total) by mouth every 4 (four) hours as needed for severe pain. (Patient not taking: Reported on 04/13/2018) 15 tablet 0 Completed Course at Unknown time    ROS Blood pressure 109/71, pulse 100, temperature (!) 100.6 F (38.1 C), temperature source Oral, resp. rate 14, SpO2 100 %.  Physical Exam Head: Normocephalic and atraumatic.  Mouth/Throat: Oropharynx is clear and moist.  Eyes: Pupils are equal, round, and reactive to light. Conjunctivae and EOM are normal.  Neck: Normal range of motion. Neck supple.  Cardiovascular: Regular rhythm and intact distal pulses. Tachycardia present.  No murmur heard. Pulmonary/Chest: Effort normal and breath sounds normal. No respiratory distress. She has no wheezes. She has no rales.  Abdominal: Soft. She exhibits no distension. There is tenderness in the right lower quadrant and suprapubic area. There is guarding. There is no rebound and no CVA tenderness.  Genitourinary: Uterus is tender. Cervix exhibits motion tenderness and discharge. Right adnexum displays tenderness. Left adnexum displays tenderness. There is tenderness in the vagina. Vaginal  discharge found.  Genitourinary Comments: 2 gauze pads present in the vaginal vault in the ED. I did not repeat pelvic exam Musculoskeletal: Normal range of motion. She exhibits no edema or tenderness.  Neurological: She is alert and oriented to person, place, and time.  Skin: Skin is warm and dry. Capillary refill takes less than 2 seconds. No rash noted. No erythema.  Psychiatric: She has a normal mood and affect. Her behavior is normal.  Nursing note and vitals reviewed Results for orders placed or performed during the hospital encounter of 06/19/18 (from the past 24 hour(s))  Lipase, blood     Status: None   Collection Time: 06/19/18  8:15 AM  Result Value Ref Range   Lipase 26 11 - 51 U/L  Comprehensive metabolic panel     Status: Abnormal   Collection Time: 06/19/18  8:15 AM  Result Value Ref Range   Sodium 138 135 - 145 mmol/L   Potassium 5.1 3.5 - 5.1 mmol/L   Chloride 106 98 - 111 mmol/L   CO2 22 22 - 32 mmol/L   Glucose, Bld 118 (H) 70 - 99 mg/dL   BUN 11 6 - 20 mg/dL   Creatinine, Ser 1.61 0.44 - 1.00 mg/dL   Calcium 9.6 8.9 - 09.6 mg/dL   Total Protein 8.5 (H) 6.5 - 8.1 g/dL   Albumin 4.4 3.5 - 5.0 g/dL   AST 32 15 - 41 U/L   ALT 11 0 - 44 U/L   Alkaline Phosphatase 54 38 - 126 U/L   Total Bilirubin 1.7 (H) 0.3 - 1.2 mg/dL   GFR calc non Af Amer >60 >60 mL/min   GFR calc Af Amer >60 >60 mL/min   Anion gap 10 5 - 15  CBC     Status: Abnormal   Collection Time: 06/19/18  8:15 AM  Result Value Ref Range   WBC 18.9 (H) 4.0 - 10.5 K/uL   RBC 4.33 3.87 - 5.11 MIL/uL   Hemoglobin 13.0 12.0 - 15.0 g/dL   HCT 04.5 40.9 - 81.1 %   MCV 92.6 80.0 - 100.0 fL   MCH 30.0 26.0 - 34.0 pg   MCHC 32.4 30.0 - 36.0 g/dL   RDW 91.4 78.2 - 95.6 %   Platelets 336 150 - 400 K/uL   nRBC 0.0 0.0 - 0.2 %  I-Stat beta hCG blood, ED     Status: None   Collection Time: 06/19/18  8:18 AM  Result Value Ref Range   I-stat hCG, quantitative <5.0 <5 mIU/mL   Comment 3          Wet prep,  genital     Status: Abnormal   Collection Time: 06/19/18  2:00 PM  Result Value Ref Range   Yeast Wet Prep HPF POC PRESENT (A) NONE SEEN   Trich, Wet Prep NONE SEEN NONE SEEN   Clue Cells Wet Prep HPF POC PRESENT (A) NONE SEEN   WBC, Wet Prep HPF POC MANY (A) NONE SEEN   Sperm NONE SEEN     US Transvaginal Non-ob  Result Date: 06/19/2018 CLINICAL DATA:  Right lower quadrant pain EXAM: TRANSABDOMINAL AND TRANSVAGINAL ULTRASOUND OF PELVIS DOPPLER  ULTRASOUND OF OVARIES TECHNIQUE: Both transabdominal and transvaginal ultrasound examinations of the pelvis were performed. Transabdominal technique was performed for global imaging of the pelvis including uterus, ovaries, adnexal regions, and pelvic cul-de-sac. It was necessary to proceed with endovaginal exam following the transabdominal exam to visualize the uterus, endometrium, ovaries and adnexa. Color and duplex Doppler ultrasound was utilized to evaluate blood flow to the ovaries. COMPARISON:  CT 05/17/2018 FINDINGS: Uterus Measurements: 8.1 x 3.6 x 4.1 cm. No fibroids or other mass visualized. Endometrium Thickness: Normal thickness, 10 mm. No focal abnormality visualized. Right ovary Measurements: 6.0 x 4.1 x 2.9 cm.  2 cm follicle.  No adnexal mass. Left ovary Measurements: 5.5 x 3.6 x 4.8 cm. 2.9 cm cystic area with internal echoes, likely small hemorrhagic cyst. Pulsed Doppler evaluation of both ovaries demonstrates normal low-resistance arterial and venous waveforms. Other findings Small amount of free fluid in the pelvis. IMPRESSION: Probable small hemorrhagic cyst in the left ovary, 2.9 cm. No evidence of ovarian torsion. Electronically Signed   By: Charlett Nose M.D.   On: 06/19/2018 11:09   US Pelvis Complete  Result Date: 06/19/2018 CLINICAL DATA:  Right lower quadrant pain EXAM: TRANSABDOMINAL AND TRANSVAGINAL ULTRASOUND OF PELVIS DOPPLER ULTRASOUND OF OVARIES TECHNIQUE: Both transabdominal and transvaginal ultrasound examinations of the  pelvis were performed. Transabdominal technique was performed for global imaging of the pelvis including uterus, ovaries, adnexal regions, and pelvic cul-de-sac. It was necessary to proceed with endovaginal exam following the transabdominal exam to visualize the uterus, endometrium, ovaries and adnexa. Color and duplex Doppler ultrasound was utilized to evaluate blood flow to the ovaries. COMPARISON:  CT 05/17/2018 FINDINGS: Uterus Measurements: 8.1 x 3.6 x 4.1 cm. No fibroids or other mass visualized. Endometrium Thickness: Normal thickness, 10 mm. No focal abnormality visualized. Right ovary Measurements: 6.0 x 4.1 x 2.9 cm.  2 cm follicle.  No adnexal mass. Left ovary Measurements: 5.5 x 3.6 x 4.8 cm. 2.9 cm cystic area with internal echoes, likely small hemorrhagic cyst. Pulsed Doppler evaluation of both ovaries demonstrates normal low-resistance arterial and venous waveforms. Other findings Small amount of free fluid in the pelvis. IMPRESSION: Probable small hemorrhagic cyst in the left ovary, 2.9 cm. No evidence of ovarian torsion. Electronically Signed   By: Charlett Nose M.D.   On: 06/19/2018 11:09   Ct Abdomen Pelvis W Contrast  Result Date: 06/19/2018 CLINICAL DATA:  Worsening right lower quadrant pain for a week. Abscess drainage in the left lower quadrant 1 month ago. EXAM: CT ABDOMEN AND PELVIS WITH CONTRAST TECHNIQUE: Multidetector CT imaging of the abdomen and pelvis was performed using the standard protocol following bolus administration of intravenous contrast. CONTRAST:  ISOVUE-300 IOPAMIDOL (ISOVUE-300) INJECTION 61% COMPARISON:  Pelvic ultrasound 06/19/2018 and CT abdomen pelvis 05/17/2018. FINDINGS: Lower chest: Lung bases show no acute findings. Heart size normal. No pericardial or pleural effusion. Distal esophagus is grossly unremarkable. Hepatobiliary: Liver and gallbladder are unremarkable. No biliary ductal dilatation. Pancreas: Negative. Spleen: Negative. Adrenals/Urinary Tract:  Adrenal glands and kidneys are unremarkable. Ureters are decompressed. Bladder is unremarkable. Stomach/Bowel: Stomach, small bowel and colon are unremarkable. Appendix may be minimally thick-walled and dilated, measuring 9 mm (series 2, image 54). No surrounding. No periappendiceal stranding. Vascular/Lymphatic: Vascular structures are unremarkable. Retroperitoneal lymph nodes measure up to 10 mm in the left periaortic station, similar. Reproductive: Uterus is visualized. There is a complex fluid collection in the right adnexa with a dilated, thick-walled tubular structure (series 2, images 60-63), likely representing the right  fallopian tube. Right ovary is not well seen. Other: Moderate pelvic free fluid with peritoneal hyper attenuation. Mild lower mesenteric edema. Musculoskeletal: No worrisome lytic or sclerotic lesions. IMPRESSION: 1. Findings in the right adnexa are worrisome for hydrosalpinx/pyosalpinx. Associated complex fluid in the anatomic pelvis with evidence of peritonitis. Tubo-ovarian abscess is not excluded. 2. Moderate pelvic ascites. 3. There may be mild secondary appendiceal inflammation. Electronically Signed   By: Leanna Battles M.D.   On: 06/19/2018 13:42   [place] Art/ven Flow Abd Pelv Doppler  Result Date: 06/19/2018 CLINICAL DATA:  Right lower quadrant pain EXAM: TRANSABDOMINAL AND TRANSVAGINAL ULTRASOUND OF PELVIS DOPPLER ULTRASOUND OF OVARIES TECHNIQUE: Both transabdominal and transvaginal ultrasound examinations of the pelvis were performed. Transabdominal technique was performed for global imaging of the pelvis including uterus, ovaries, adnexal regions, and pelvic cul-de-sac. It was necessary to proceed with endovaginal exam following the transabdominal exam to visualize the uterus, endometrium, ovaries and adnexa. Color and duplex Doppler ultrasound was utilized to evaluate blood flow to the ovaries. COMPARISON:  CT 05/17/2018 FINDINGS: Uterus Measurements: 8.1 x 3.6 x 4.1 cm. No  fibroids or other mass visualized. Endometrium Thickness: Normal thickness, 10 mm. No focal abnormality visualized. Right ovary Measurements: 6.0 x 4.1 x 2.9 cm.  2 cm follicle.  No adnexal mass. Left ovary Measurements: 5.5 x 3.6 x 4.8 cm. 2.9 cm cystic area with internal echoes, likely small hemorrhagic cyst. Pulsed Doppler evaluation of both ovaries demonstrates normal low-resistance arterial and venous waveforms. Other findings Small amount of free fluid in the pelvis. IMPRESSION: Probable small hemorrhagic cyst in the left ovary, 2.9 cm. No evidence of ovarian torsion. Electronically Signed   By: Charlett Nose M.D.   On: 06/19/2018 11:09    Assessment/Plan: Right hydrosalpinx vs TOA for IV antibiotic management and consideration of repeat drainage  Scheryl Darter 06/19/2018, 5:39 PM

## 2018-06-19 NOTE — ED Notes (Signed)
[facility] called. 

## 2018-06-19 NOTE — ED Notes (Signed)
Report called to Raynelle Fanning, RN at [facility]. Patient is going to room 307.

## 2018-06-19 NOTE — ED Notes (Signed)
MD made aware pt needing more pain medication

## 2018-06-19 NOTE — ED Triage Notes (Addendum)
[facility] worsening RLQ pain for a week; hx of ovarian cyst.

## 2018-06-20 ENCOUNTER — Encounter (HOSPITAL_COMMUNITY): Payer: Self-pay | Admitting: *Deleted

## 2018-06-20 DIAGNOSIS — N7093 Salpingitis and oophoritis, unspecified: Principal | ICD-10-CM

## 2018-06-20 LAB — PROTIME-INR
INR: 1.29
Prothrombin Time: 16 seconds — ABNORMAL HIGH (ref 11.4–15.2)

## 2018-06-20 LAB — GC/CHLAMYDIA PROBE AMP (~~LOC~~) NOT AT ARMC
Chlamydia: NEGATIVE
Neisseria Gonorrhea: NEGATIVE

## 2018-06-20 MED ORDER — KETOROLAC TROMETHAMINE 30 MG/ML IJ SOLN
30.0000 mg | Freq: Four times a day (QID) | INTRAMUSCULAR | Status: DC | PRN
  Administered 2018-06-20 – 2018-06-23 (×7): 30 mg via INTRAVENOUS
  Filled 2018-06-20 (×7): qty 1

## 2018-06-20 NOTE — Progress Notes (Signed)
Subjective: Patient reports lower abdominal pain  Objective: I have reviewed patient's vital signs, medications, labs and radiology results. Blood pressure (!) 93/55, pulse (!) 123, temperature (!) 100.8 F (38.2 C), temperature source Oral, resp. rate 18, SpO2 99 %.  General: alert, cooperative and mild distress GI: tender lower abdomen Extremities: extremities normal, atraumatic, no cyanosis or edema   Assessment/Plan: Right TOA, consult IR for possible drain  LOS: 1 day    Scheryl Darter 06/20/2018, 10:06 AM

## 2018-06-20 NOTE — Progress Notes (Signed)
Patient given incentive spirometer and instructed her to use.  This was given to her to try to decrease her temperature, she is unable to take acetaminophen and ibuprofen.

## 2018-06-20 NOTE — Progress Notes (Signed)
Patient ID: Paula Lucas, female   DOB: [birth year], 27 y.o.   MRN: [id number] Request received for possible drainage of right tubo-ovarian abscess in patient.  Imaging studies were reviewed by Dr. Fredia Sorrow and there is no good percutaneous window or dominant pocket to target for drainage in this patient.  Please contact Dr. Fredia Sorrow at [phone] or [phone] with any additional questions.

## 2018-06-20 NOTE — Care Management Note (Signed)
Case Management Note  Patient Details  Name: BRINLYNN GORTON MRN: [id number] Date of Birth: 28-Mar-1991  Subjective/Objective: 27 year old female admitted 06/19/18 with TOA.                  Action/Plan:D/C when medically stable.            Expected Discharge Plan:  Home/Self Care  In-House Referral:  Clinical Social Work, Museum/gallery exhibitions officer  CM Consult  Status of Service:  Completed, signed off   Additional Comments:CM asked to see pt for transportation issues.  According to patient, she has no transportation issues, she uses the bus system  Pt states she started working at [employer] this week,  5a-3p, and hopes to get a car in 2 weeks.   Pt states that Dr. Estanislado Emms is her PCP at [facility] and [facility] knows she can get her medications there. Pt with questions regarding her hospital bill-she states she has already spoken with the financial counselor this admission. Pt states she will follow up with a phone call to Office of Patient Experience.  Kathi Der RNC-MNN, BSN 06/20/2018, 2:24 PM

## 2018-06-20 NOTE — Progress Notes (Signed)
Ice pack placed behind patients neck.  I also turned the thermostat down.

## 2018-06-20 NOTE — Progress Notes (Addendum)
Patient requesting to go outside and smoke due to the hospital giving her anxiety. Patient got an attitude after nurse stated she had to take the kids in her room with her outside. Nurse suggested a nicotine patch, patient declined.    0981- Patient demanded to have the IV disconnected or she will take the IV out of her hand. IV saline locked and patient went downstairs. Will reconnect when patient arrives back on the unit.

## 2018-06-21 LAB — WET PREP, GENITAL
Clue Cells Wet Prep HPF POC: NONE SEEN
TRICH WET PREP: NONE SEEN
Yeast Wet Prep HPF POC: NONE SEEN

## 2018-06-21 LAB — URINALYSIS, ROUTINE W REFLEX MICROSCOPIC
Bilirubin Urine: NEGATIVE
Glucose, UA: NEGATIVE mg/dL
KETONES UR: 80 mg/dL — AB
Nitrite: NEGATIVE
PH: 5 (ref 5.0–8.0)
Protein, ur: NEGATIVE mg/dL
Specific Gravity, Urine: 1.017 (ref 1.005–1.030)

## 2018-06-21 MED ORDER — SODIUM CHLORIDE 0.9 % IV SOLN
3.0000 g | Freq: Four times a day (QID) | INTRAVENOUS | Status: DC
  Administered 2018-06-21 – 2018-06-25 (×16): 3 g via INTRAVENOUS
  Filled 2018-06-21 (×21): qty 3

## 2018-06-21 MED ORDER — POLYETHYLENE GLYCOL 3350 17 G PO PACK
17.0000 g | PACK | Freq: Every day | ORAL | Status: DC
  Administered 2018-06-21: 17 g via ORAL
  Filled 2018-06-21 (×2): qty 1

## 2018-06-21 MED ORDER — FLUCONAZOLE 150 MG PO TABS
150.0000 mg | ORAL_TABLET | Freq: Once | ORAL | Status: AC
  Administered 2018-06-21: 150 mg via ORAL
  Filled 2018-06-21: qty 1

## 2018-06-21 NOTE — Progress Notes (Signed)
Dr Adrian Blackwater called about patient's complaints.  He will report to the attending for day shift.

## 2018-06-21 NOTE — Progress Notes (Signed)
Subjective: Patient reports nausea.  Low abdominal and low back pain not improved IR reviewed imaging and she was not a candidate for percutaneous drainage Objective: I have reviewed patient's vital signs, intake and output, medications, labs and radiology results. Blood pressure 108/77, pulse (!) 111, temperature 99.1 F (37.3 C), temperature source Oral, resp. rate 18, SpO2 100 %.  General: alert, cooperative and mild distress GI: abnormal findings:  guarding and moderate tenderness in the lower abdomen Extremities: extremities normal, atraumatic, no cyanosis or edema CBC    Component Value Date/Time   WBC 18.9 (H) 06/19/2018 0815   RBC 4.33 06/19/2018 0815   HGB 13.0 06/19/2018 0815   HGB 11.2 04/13/2018 1512   HCT 40.1 06/19/2018 0815   HCT 35.0 04/13/2018 1512   PLT 336 06/19/2018 0815   PLT 437 04/13/2018 1512   MCV 92.6 06/19/2018 0815   MCV 92 04/13/2018 1512   MCH 30.0 06/19/2018 0815   MCHC 32.4 06/19/2018 0815   RDW 15.5 06/19/2018 0815   RDW 15.8 (H) 04/13/2018 1512   LYMPHSABS 4.7 (H) 04/13/2018 1512   MONOABS 0.3 04/01/2018 1424   EOSABS 0.3 04/13/2018 1512   BASOSABS 0.0 04/13/2018 1512     Assessment/Plan: Zosyn and doxycycline. Follow symptoms and temperature, fever curve is improved. Imaging tomorrow if no improvement in pain and abdominal findings  LOS: 2 days    Scheryl Darter 06/21/2018, 12:21 PM

## 2018-06-21 NOTE — Progress Notes (Signed)
Pt yelling and cursing saying that nothing we are doing is helping her and that she is not getting any better. She proceeds to pull the IV out of her hand and refuses to let me place a dressing over the site.

## 2018-06-21 NOTE — Progress Notes (Signed)
RN started new IV patient was instructed on new antibiotic.  Dr Debroah Loop also looked at the rash in her perineal area.

## 2018-06-22 NOTE — Progress Notes (Signed)
Pt refusing restarting of IV antibiotics at this time.

## 2018-06-22 NOTE — Progress Notes (Signed)
Subjective: Patient reports tolerating PO, + flatus and + BM.    Objective: I have reviewed patient's vital signs and medications. Blood pressure 105/68, pulse (!) 115, temperature (!) 100.7 F (38.2 C), temperature source Oral, resp. rate 18, SpO2 99 %.  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 Consult Dr April Manson, continue present ABX  LOS: 3 days    Scheryl Darter 06/22/2018, 9:15 AM
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
## 2018-06-22 NOTE — Progress Notes (Signed)
[facility] called for IV start

## 2018-06-22 NOTE — Progress Notes (Signed)
Patient refusing for IV fluids/antibiotics to be restarted. Requested to see MD. Will contact MD on call.

## 2018-06-23 MED ORDER — DOXYCYCLINE HYCLATE 100 MG PO TABS
100.0000 mg | ORAL_TABLET | Freq: Two times a day (BID) | ORAL | Status: DC
  Filled 2018-06-23 (×2): qty 1

## 2018-06-23 MED ORDER — HYDROMORPHONE HCL 1 MG/ML IJ SOLN
1.0000 mg | INTRAMUSCULAR | Status: DC | PRN
  Administered 2018-06-23 (×3): 2 mg via INTRAVENOUS
  Administered 2018-06-24 (×2): 1 mg via INTRAVENOUS
  Filled 2018-06-23 (×3): qty 2
  Filled 2018-06-23 (×2): qty 1

## 2018-06-23 MED ORDER — METRONIDAZOLE 500 MG PO TABS
500.0000 mg | ORAL_TABLET | Freq: Two times a day (BID) | ORAL | Status: DC
  Administered 2018-06-23 – 2018-06-25 (×4): 500 mg via ORAL
  Filled 2018-06-23 (×4): qty 1

## 2018-06-23 NOTE — Progress Notes (Signed)
IVF paused d/t pt request for w/c privilege.

## 2018-06-23 NOTE — Progress Notes (Signed)
Patient ID: Paula Lucas, female   DOB: 1991-08-18, 27 y.o.   MRN: [id number] Subjective: Patient reports tolerating PO, + flatus and + BM. Still little appetite, was NPO this morning   Objective: I have reviewed patient's vital signs and medications. Blood pressure 129/83, pulse 83, temperature 98.6 F (37 C), temperature source Oral, resp. rate 16, SpO2 100 %.   General: alert, cooperative and no distress Resp: nl effort GI: Tender lower quad, guarding    Assessment/Plan: Day 5 hospitalization with chronic PID with marginal improvement. Consult Dr April Manson and he will see her today. She may eat Continue present ABX  LOS: 4 days    Scheryl Darter 06/23/2018 10:52 AM

## 2018-06-24 MED ORDER — HYDROCORTISONE 1 % EX CREA
TOPICAL_CREAM | Freq: Two times a day (BID) | CUTANEOUS | Status: DC
  Administered 2018-06-24 – 2018-06-25 (×3): via TOPICAL
  Filled 2018-06-24: qty 28

## 2018-06-24 MED ORDER — HYDROMORPHONE HCL 2 MG PO TABS
2.0000 mg | ORAL_TABLET | ORAL | Status: DC | PRN
  Administered 2018-06-24 – 2018-06-25 (×3): 2 mg via ORAL
  Filled 2018-06-24 (×3): qty 1

## 2018-06-24 MED ORDER — HYDROMORPHONE HCL 2 MG/ML IJ SOLN
2.0000 mg | Freq: Once | INTRAMUSCULAR | Status: AC
  Administered 2018-06-25: 2 mg via INTRAVENOUS
  Filled 2018-06-24: qty 1

## 2018-06-24 MED ORDER — KETOROLAC TROMETHAMINE 10 MG PO TABS
10.0000 mg | ORAL_TABLET | Freq: Four times a day (QID) | ORAL | Status: DC | PRN
  Administered 2018-06-24 – 2018-06-25 (×3): 10 mg via ORAL
  Filled 2018-06-24 (×5): qty 1

## 2018-06-24 NOTE — Progress Notes (Signed)
Subjective: Patient reports no improvement since admission. She reports persistent abdominal pain of unchanged severity. She is ambulating, voiding and has bowel movements. She denies nausea or emesis    Objective: I have reviewed patient's vital signs. Vitals:   06/23/18 1723 06/23/18 1951 06/23/18 2356 06/24/18 0818  BP: 128/88 117/88 111/72 140/88  Pulse: 95 97 92 74  Resp: 18 17 17 18   Temp: 98.6 F (37 C) 98.7 F (37.1 C) 98.4 F (36.9 C) 97.8 F (36.6 C)  TempSrc: Oral Oral  Oral  SpO2: 100% 100% 100% 100%   GENERAL: Well-developed, well-nourished female in no acute distress.  ABDOMEN: Soft,  Nondistended, diffusely tender EXTREMITIES: No cyanosis, clubbing, or edema, 2+ distal pulses.    Assessment/Plan: 27 yo with right TOA and left hydrosalpinx - Patient afebrile overnight - Discussed discharge planning tomorrow but patient desires to stay until Monday - Will transition to po pain medication - Patient has a follow up appointment with Dr. Billy Fischer in 2 weeks   LOS: 5 days    Paula Lucas 06/24/2018, 9:20 AM

## 2018-06-24 NOTE — Progress Notes (Signed)
Pt has requested IV fluids to be discontinued x2 since 1925(once for wheelchair ride, once for shower). IV infiltrated at 2255. Dr. Jolayne Panther notified, as pt states she is a hard stick, has been stuck multiple times this admission, and states CRNA had to start last IV. Per Dr. Jolayne Panther, pt still needs IV access. CRNA notified. Pt requesting to take wheelchair ride outside; instructed to do so prior to CRNA coming to start IV as this RN does not want to disconnect IV fluids for the rest of the night to preserve IV site. Pt verbalized agreement. Pt requesting dilaudid to be switched back to IV instead of po because "it works better." Dr. Jolayne Panther called, one time IV order received.

## 2018-06-25 ENCOUNTER — Encounter (HOSPITAL_COMMUNITY): Payer: Self-pay | Admitting: Certified Registered Nurse Anesthetist

## 2018-06-25 MED ORDER — DOXYCYCLINE HYCLATE 100 MG PO CAPS: 100.0000 mg | ORAL_CAPSULE | Freq: Two times a day (BID) | ORAL | 0 refills | Status: DC

## 2018-06-25 MED ORDER — OXYCODONE HCL 5 MG PO TABS: 5.0000 mg | ORAL_TABLET | Freq: Four times a day (QID) | ORAL | 0 refills | Status: DC | PRN

## 2018-06-25 MED ORDER — METRONIDAZOLE 500 MG PO TABS: 500.0000 mg | ORAL_TABLET | Freq: Two times a day (BID) | ORAL | 0 refills | Status: DC

## 2018-06-25 NOTE — Progress Notes (Signed)
Patient requesting discharge. Dr Alysia Penna will be up to put that in

## 2018-06-25 NOTE — Discharge Summary (Signed)
Physician Discharge Summary  Patient ID: Paula Lucas MRN: [id number] DOB/AGE: [birth year] 27 y.o.  Admit date: 06/19/2018 Discharge date: 06/25/2018  Admission Diagnoses: Right TOA  Discharge Diagnoses:  Active Problems:   TOA (tubo-ovarian abscess)   Tubo-ovarian abscess   Right tubo-ovarian abscess   Discharged Condition: stable  Hospital Course: Ms Noy was admitted with above Dx. Received IV antibiotics and pain medication. Was not a candidate for interventional radiology. Became afebrile, WBC decreased to normal. Was seen by Dr Billy Fischer for further out pt management d/t to H/O previous TOA.  Progressed to ambulating, voiding, tolerating diet and oral pain control Felt amendable for discharge home to complete oral antibiotics and follow up with Dr. Billy Fischer.   Consults: Interventional Radiology and REI  Significant Diagnostic Studies: labs, microbiology and radiology:   Treatments: IV hydration and antibiotics: Zosyn, Flagy and Doxycycline  Discharge Exam: Blood pressure 127/90, pulse 67, temperature 99.5 F (37.5 C), temperature source Oral, resp. rate 18, SpO2 100 %. Lungs clear Heart RRR Abd soft + BS  Ext non tender  Disposition: Discharge disposition: 01-Home or Self Care       Discharge Instructions    Call MD for:  difficulty breathing, headache or visual disturbances   Complete by:  As directed    Call MD for:  persistant nausea and vomiting   Complete by:  As directed    Call MD for:  severe uncontrolled pain   Complete by:  As directed    Call MD for:  temperature >100.4   Complete by:  As directed    Diet - low sodium heart healthy   Complete by:  As directed    Increase activity slowly   Complete by:  As directed      Allergies as of 06/25/2018      Reactions   Fish Allergy Anaphylaxis   Severe allergic reaction to all seafoods   Ibuprofen Anaphylaxis   Pt tolerated Ketorolac 04/02/18   Mushroom Extract Complex Anaphylaxis   Tylenol  [acetaminophen] Anaphylaxis      Medication List    STOP taking these medications   hydrOXYzine 25 MG tablet Commonly known as:  ATARAX/VISTARIL     TAKE these medications   doxycycline 100 MG capsule Commonly known as:  VIBRAMYCIN Take 1 capsule (100 mg total) by mouth 2 (two) times daily.   metroNIDAZOLE 500 MG tablet Commonly known as:  FLAGYL Take 1 tablet (500 mg total) by mouth 2 (two) times daily.   oxyCODONE 5 MG immediate release tablet Commonly known as:  Oxy IR/ROXICODONE Take 1 tablet (5 mg total) by mouth every 6 (six) hours as needed for moderate pain. What changed:    when to take this  reasons to take this      Follow-up Information    Fermin Schwab, MD. Schedule an appointment as soon as possible for a visit in 2 week(s).   Specialty:  Obstetrics and Gynecology Contact information: [address] [place]. [place] [address] [phone]           Signed: Hermina Staggers 06/25/2018, 3:29 PM

## 2018-06-25 NOTE — Progress Notes (Signed)
Patient requesting IV pain meds. Nurse educated on readiness for discharge tomorrow to home with no iv access. Patient requested to speak with MD. Md notified.

## 2018-06-25 NOTE — Progress Notes (Signed)
Patient ID: Paula Lucas, female   DOB: 08/11/1991, 27 y.o.   MRN: [id number] Subjective: Patient reports persistent abdominal pain of unchanged severity. She is ambulating, voiding and has bowel movements. She denies nausea or emesis. She is tolerating po intake  Objective: I have reviewed patient's vital signs. Vitals:   06/24/18 0818 06/24/18 1618 06/24/18 2034 06/25/18 0428  BP: 140/88 114/85 125/83 109/71  Pulse: 74 84 73 64  Resp: 18 18 16 16   Temp: 97.8 F (36.6 C) 98.2 F (36.8 C) 97.8 F (36.6 C) 98.6 F (37 C)  TempSrc: Oral     SpO2: 100% 100% 100% 100%   GENERAL: Well-developed, well-nourished female in no acute distress.  ABDOMEN: Soft,  nondistended, diffusely tender EXTREMITIES: No cyanosis, clubbing, or edema, 2+ distal pulses.    Assessment/Plan: 27 yo with right TOA and left hydrosalpinx - Patient afebrile for 48 hours - Discharge planning tomorrow  - Will transition to po pain medication - Plan for follow up with Dr. Billy Fischer in 2 weeks   LOS: 6 days    Felica Chargois 06/25/2018, 7:56 AM

## 2018-08-21 ENCOUNTER — Emergency Department (HOSPITAL_COMMUNITY)
Admission: EM | Admit: 2018-08-21 | Discharge: 2018-08-22 | Disposition: A | Discharge status: Home / Self Care | Payer: Self-pay | Attending: Emergency Medicine | Admitting: Emergency Medicine

## 2018-08-21 ENCOUNTER — Emergency Department (HOSPITAL_COMMUNITY): Payer: Self-pay

## 2018-08-21 ENCOUNTER — Other Ambulatory Visit: Payer: Self-pay

## 2018-08-21 ENCOUNTER — Encounter (HOSPITAL_COMMUNITY): Payer: Self-pay | Admitting: Emergency Medicine

## 2018-08-21 DIAGNOSIS — R69 Illness, unspecified: Secondary | ICD-10-CM

## 2018-08-21 DIAGNOSIS — F17210 Nicotine dependence, cigarettes, uncomplicated: Secondary | ICD-10-CM | POA: Insufficient documentation

## 2018-08-21 DIAGNOSIS — J111 Influenza due to unidentified influenza virus with other respiratory manifestations: Secondary | ICD-10-CM | POA: Insufficient documentation

## 2018-08-21 DIAGNOSIS — J45909 Unspecified asthma, uncomplicated: Secondary | ICD-10-CM | POA: Insufficient documentation

## 2018-08-21 LAB — I-STAT BETA HCG BLOOD, ED (MC, WL, AP ONLY): I-stat hCG, quantitative: <5 m[IU]/mL (ref <5)

## 2018-08-21 LAB — BASIC METABOLIC PANEL
Anion gap: 11 (ref 5–15)
CALCIUM: 9.3 mg/dL (ref 8.9–10.3)
CHLORIDE: 105 mmol/L (ref 98–111)
CO2: 21 mmol/L — ABNORMAL LOW (ref 22–32)
CREATININE: 0.94 mg/dL (ref 0.44–1.00)
GFR calc non Af Amer: >60 mL/min (ref >60)
GLUCOSE: 89 mg/dL (ref 70–99)
Potassium: 3.6 mmol/L (ref 3.5–5.1)
Sodium: 137 mmol/L (ref 135–145)

## 2018-08-21 LAB — CBC
HEMATOCRIT: 37.9 % (ref 36.0–46.0)
HEMOGLOBIN: 12.7 g/dL (ref 12.0–15.0)
MCH: 31.1 pg (ref 26.0–34.0)
MCHC: 33.5 g/dL (ref 30.0–36.0)
MCV: 92.9 fL (ref 80.0–100.0)
Platelets: 301 10*3/uL (ref 150–400)
RBC: 4.08 MIL/uL (ref 3.87–5.11)
RDW: 14.7 % (ref 11.5–15.5)
WBC: 7.3 10*3/uL (ref 4.0–10.5)
nRBC: 0 % (ref 0.0–0.2)

## 2018-08-21 MED ORDER — ONDANSETRON 4 MG PO TBDP: 4.0000 mg | ORAL_TABLET | Freq: Three times a day (TID) | ORAL | 0 refills | Status: DC | PRN

## 2018-08-21 MED ORDER — KETOROLAC TROMETHAMINE 30 MG/ML IJ SOLN
30.0000 mg | Freq: Once | INTRAMUSCULAR | Status: AC
  Administered 2018-08-21: 30 mg via INTRAVENOUS
  Filled 2018-08-21: qty 1

## 2018-08-21 MED ORDER — KETOROLAC TROMETHAMINE 10 MG PO TABS: 10.0000 mg | ORAL_TABLET | Freq: Three times a day (TID) | ORAL | 0 refills | Status: DC | PRN

## 2018-08-21 MED ORDER — SODIUM CHLORIDE 0.9 % IV BOLUS
1000.0000 mL | Freq: Once | INTRAVENOUS | Status: AC
  Administered 2018-08-21: 1000 mL via INTRAVENOUS

## 2018-08-21 MED ORDER — ALBUTEROL SULFATE (2.5 MG/3ML) 0.083% IN NEBU
5.0000 mg | INHALATION_SOLUTION | Freq: Once | RESPIRATORY_TRACT | Status: AC
  Administered 2018-08-21: 5 mg via RESPIRATORY_TRACT
  Filled 2018-08-21: qty 6

## 2018-08-21 MED ORDER — OXYCODONE HCL 5 MG PO TABS
5.0000 mg | ORAL_TABLET | Freq: Once | ORAL | Status: AC
  Administered 2018-08-21: 5 mg via ORAL
  Filled 2018-08-21: qty 1

## 2018-08-21 MED ORDER — ONDANSETRON HCL 4 MG/2ML IJ SOLN
4.0000 mg | Freq: Once | INTRAMUSCULAR | Status: AC
  Administered 2018-08-21: 4 mg via INTRAVENOUS
  Filled 2018-08-21: qty 2

## 2018-08-21 MED ORDER — MORPHINE SULFATE (PF) 4 MG/ML IV SOLN
4.0000 mg | Freq: Once | INTRAVENOUS | Status: AC
  Administered 2018-08-21: 4 mg via INTRAVENOUS
  Filled 2018-08-21: qty 1

## 2018-08-21 NOTE — ED Triage Notes (Signed)
Pt arrives to ED from home with complaints of generalized body aches, emesis, chills, and a fever for the last four days. Pt reports she been becoming more short of breath.

## 2018-08-21 NOTE — Discharge Instructions (Addendum)
It was my pleasure taking care of you today!   Increase hydration.   Rest.   Toradol every 8 hours for fever.   Zofran as needed for nausea.   Follow up with your doctor - call tomorrow to schedule an appointment.   Return to ER for new or worsening symptoms, any additional concerns.

## 2018-08-21 NOTE — ED Notes (Signed)
Pt acutely dramatic c/o continued intense generalized pain. Given departing dose of pain meds as noted here, and reviewed d/c instructions, including prescriptions. Pt requesting cab voucher. Communicated that we don't have any vouchers available, but that she and her boyfriend were welcome to stay out in the lobby as long as necessary to wait for a ride home. Pt ambulatory in hallway, no longer vomiting, breathing without incr'd effort, and CAOx4. Wheelchair provided to pt at discharge for transport to front lobby.

## 2018-08-21 NOTE — ED Provider Notes (Signed)
[facility] EMERGENCY DEPARTMENT Provider Note   CSN: [id number] Arrival date & time: 08/21/18  1823     History   Chief Complaint Chief Complaint  Patient presents with  . Generalized Body Aches  . Shortness of Breath  . Chills    HPI Paula Lucas is a 27 y.o. female.  The history is provided by the patient and medical records. No language interpreter was used.  Shortness of Breath  Associated symptoms include a fever and cough.   Paula Lucas is a 27 y.o. female  with a PMH of asthma who presents to the Emergency Department complaining of persistent generalized body aches, nausea, vomiting, fever, chills, shortness of breath and cough for the last 4 days.  She did get a flu shot and feels like that is what caused her symptoms.  Unfortunately, she reports anaphylactic reaction where her throat closes with Tylenol, ibuprofen and aspirin, therefore she has not taken any medication for her symptoms.   Past Medical History:  Diagnosis Date  . Asthma   . Asthma     Patient Active Problem List   Diagnosis Date Noted  . Tubo-ovarian abscess 06/19/2018  . Right tubo-ovarian abscess 06/19/2018  . Neisseria gonorrheae 03/06/2018 04/02/2018  . TOA (tubo-ovarian abscess) 04/01/2018  . BV (bacterial vaginosis) 04/01/2018  . Left ovarian cyst 04/16/2011    Past Surgical History:  Procedure Laterality Date  . INDUCED ABORTION    . IR RADIOLOGIST EVAL & MGMT  05/17/2018  . TONSILLECTOMY       OB History    Gravida  1   Para  0   Term  0   Preterm  0   AB  1   Living  0     SAB  0   TAB  1   Ectopic  0   Multiple  0   Live Births               Home Medications    Prior to Admission medications   Medication Sig Start Date End Date Taking? Authorizing Provider  Fluticasone-Salmeterol (ADVAIR HFA IN) Inhale 2 puffs into the lungs 2 (two) times daily as needed ("for flares").   Yes [provider]  doxycycline  (VIBRAMYCIN) 100 MG capsule Take 1 capsule (100 mg total) by mouth 2 (two) times daily. Patient not taking: Reported on 08/21/2018 06/25/18   Hermina Staggers, MD  ketorolac (TORADOL) 10 MG tablet Take 1 tablet (10 mg total) by mouth every 8 (eight) hours as needed (Fever). 08/21/18   Chyane Greer, Chase Picket, PA-C  metroNIDAZOLE (FLAGYL) 500 MG tablet Take 1 tablet (500 mg total) by mouth 2 (two) times daily. Patient not taking: Reported on 08/21/2018 06/25/18   Hermina Staggers, MD  ondansetron (ZOFRAN ODT) 4 MG disintegrating tablet Take 1 tablet (4 mg total) by mouth every 8 (eight) hours as needed for nausea or vomiting. 08/21/18   Makaria Poarch, Chase Picket, PA-C  oxyCODONE (OXY IR/ROXICODONE) 5 MG immediate release tablet Take 1 tablet (5 mg total) by mouth every 6 (six) hours as needed for moderate pain. Patient not taking: Reported on 08/21/2018 06/25/18   Hermina Staggers, MD    Family History Family History  Problem Relation Age of Onset  . Diabetes Mother   . Hypertension Mother   . Diabetes Father   . Hypertension Father   . Diabetes Sister   . Hypertension Sister     Social History Social History  Tobacco Use  . Smoking status: Smoker, Current Status Unknown    Packs/day: 0.50    Years: 9.00    Pack years: 4.50    Types: Cigarettes  . Smokeless tobacco: Never Used  Substance Use Topics  . Alcohol use: No  . Drug use: Yes    Types: Marijuana    Comment: THC GUMMIES LAST MONTH     Allergies   Fish allergy; Ibuprofen; Mushroom extract complex; Shellfish-derived products; and Tylenol [acetaminophen]   Review of Systems Review of Systems  Constitutional: Positive for chills and fever.  HENT: Positive for congestion.   Respiratory: Positive for cough and shortness of breath.   Musculoskeletal: Positive for arthralgias and myalgias.  All other systems reviewed and are negative.    Physical Exam Updated Vital Signs BP 124/72   Pulse (!) 107   Temp 98 F (36.7 C)  (Oral)   Resp 13   Ht 5\' 2"  (1.575 m)   Wt 74.8 kg   SpO2 97%   BMI 30.18 kg/m   Physical Exam Vitals signs and nursing note reviewed.  Constitutional:      General: She is not in acute distress.    Appearance: She is well-developed.  HENT:     Head: Normocephalic and atraumatic.  Cardiovascular:     Heart sounds: Normal heart sounds. No murmur.     Comments: Tachycardic, but regular. Pulmonary:     Effort: Pulmonary effort is normal. No respiratory distress.     Breath sounds: Normal breath sounds.     Comments: Lungs clear to auscultation bilaterally. Abdominal:     General: There is no distension.     Palpations: Abdomen is soft.     Tenderness: There is no abdominal tenderness.  Musculoskeletal: Normal range of motion.  Skin:    General: Skin is warm and dry.  Neurological:     Mental Status: She is alert and oriented to person, place, and time.      ED Treatments / Results  Labs (all labs ordered are listed, but only abnormal results are displayed) Labs Reviewed  BASIC METABOLIC PANEL - Abnormal; Notable for the following components:      Result Value   CO2 21 (*)    BUN <5 (*)    All other components within normal limits  CBC  I-STAT BETA HCG BLOOD, ED ([facility], [facility], [facility] ONLY)    EKG None  Radiology Dg Chest 2 View  Result Date: 08/21/2018 CLINICAL DATA:  Chills and fever.  Shortness of breath. EXAM: CHEST - 2 VIEW COMPARISON:  September 04, 2011 FINDINGS: Lungs are clear. The heart size and pulmonary vascularity are normal. No adenopathy. No bone lesions. IMPRESSION: No edema or consolidation. Electronically Signed   By: Bretta BangWilliam  Woodruff III M.D.   On: 08/21/2018 21:09    Procedures Procedures (including critical care time)  Medications Ordered in ED Medications  albuterol (PROVENTIL) (2.5 MG/3ML) 0.083% nebulizer solution 5 mg (5 mg Nebulization Given 08/21/18 1921)  ketorolac (TORADOL) 30 MG/ML injection 30 mg (30 mg Intravenous Given 08/21/18 2155)    sodium chloride 0.9 % bolus 1,000 mL (0 mLs Intravenous Stopped 08/21/18 2326)  ondansetron (ZOFRAN) injection 4 mg (4 mg Intravenous Given 08/21/18 2149)  morphine 4 MG/ML injection 4 mg (4 mg Intravenous Given 08/21/18 2150)  sodium chloride 0.9 % bolus 1,000 mL (0 mLs Intravenous Stopped 08/22/18 0000)  oxyCODONE (Oxy IR/ROXICODONE) immediate release tablet 5 mg (5 mg Oral Given 08/21/18 2337)     Initial  Impression / Assessment and Plan / ED Course  I have reviewed the triage vital signs and the nursing notes.  Pertinent labs & imaging results that were available during my care of the patient were reviewed by me and considered in my medical decision making (see chart for details).    Paula Lucas is a 27 y.o. female who presents to ED for flulike symptoms for the last 4 days including cough, congestion, generalized body aches, nausea vomiting.  Labs reviewed and reassuring.  Normal white count.  Chest x-ray without acute findings.  Unfortunately, patient reports anaphylactic reaction to ibuprofen, Tylenol and aspirin, making control of her fever at home very difficult.  She is taken no medications prior to arrival for symptoms.  She was given Toradol here in the emergency department and tolerated well without any reaction.  Temperature down to 98.  Heart rate improved.  Tolerating p.o. Out of window for Tamiflu. Evaluation does not show pathology that would require ongoing emergent intervention or inpatient treatment. Given very short course of PO toradol for fever control at home given her numerous allergies. Zofran as needed for nausea. PCP follow up encouraged. Return precautions discussed. All questions answered.   Patient discussed with Dr. Adela LankFloyd who agrees with treatment plan.    Final Clinical Impressions(s) / ED Diagnoses   Final diagnoses:  Influenza-like illness    ED Discharge Orders         Ordered    ondansetron (ZOFRAN ODT) 4 MG disintegrating tablet  Every 8 hours  PRN,   Status:  Discontinued     08/21/18 2331    ketorolac (TORADOL) 10 MG tablet  Every 8 hours PRN,   Status:  Discontinued     08/21/18 2331    ketorolac (TORADOL) 10 MG tablet  Every 8 hours PRN     08/21/18 2333    ondansetron (ZOFRAN ODT) 4 MG disintegrating tablet  Every 8 hours PRN     08/21/18 2334           Ryver Poblete, Chase PicketJaime Pilcher, PA-C 08/22/18 0009    Melene PlanFloyd, Dan, DO 08/22/18 0013

## 2018-10-04 ENCOUNTER — Ambulatory Visit: Payer: Medicaid Other

## 2019-01-15 ENCOUNTER — Encounter (HOSPITAL_COMMUNITY): Payer: Self-pay | Admitting: Emergency Medicine

## 2019-01-15 ENCOUNTER — Emergency Department (HOSPITAL_COMMUNITY)
Admission: EM | Admit: 2019-01-15 | Discharge: 2019-01-16 | Disposition: A | Discharge status: Home / Self Care | Payer: Medicaid Other | Attending: Emergency Medicine | Admitting: Emergency Medicine

## 2019-01-15 ENCOUNTER — Other Ambulatory Visit: Payer: Self-pay

## 2019-01-15 ENCOUNTER — Emergency Department (HOSPITAL_COMMUNITY): Payer: Medicaid Other

## 2019-01-15 DIAGNOSIS — Z20828 Contact with and (suspected) exposure to other viral communicable diseases: Secondary | ICD-10-CM | POA: Insufficient documentation

## 2019-01-15 DIAGNOSIS — J45909 Unspecified asthma, uncomplicated: Secondary | ICD-10-CM | POA: Insufficient documentation

## 2019-01-15 DIAGNOSIS — Z046 Encounter for general psychiatric examination, requested by authority: Secondary | ICD-10-CM | POA: Insufficient documentation

## 2019-01-15 DIAGNOSIS — F4310 Post-traumatic stress disorder, unspecified: Secondary | ICD-10-CM | POA: Insufficient documentation

## 2019-01-15 DIAGNOSIS — R45851 Suicidal ideations: Secondary | ICD-10-CM | POA: Insufficient documentation

## 2019-01-15 DIAGNOSIS — F332 Major depressive disorder, recurrent severe without psychotic features: Secondary | ICD-10-CM | POA: Insufficient documentation

## 2019-01-15 DIAGNOSIS — M545 Low back pain, unspecified: Secondary | ICD-10-CM

## 2019-01-15 DIAGNOSIS — Z3202 Encounter for pregnancy test, result negative: Secondary | ICD-10-CM

## 2019-01-15 DIAGNOSIS — F17210 Nicotine dependence, cigarettes, uncomplicated: Secondary | ICD-10-CM | POA: Insufficient documentation

## 2019-01-15 DIAGNOSIS — R1084 Generalized abdominal pain: Secondary | ICD-10-CM | POA: Insufficient documentation

## 2019-01-15 LAB — URINALYSIS, ROUTINE W REFLEX MICROSCOPIC
Bacteria, UA: NONE SEEN
Bilirubin Urine: NEGATIVE
Glucose, UA: NEGATIVE mg/dL
Ketones, ur: NEGATIVE mg/dL
Leukocytes,Ua: NEGATIVE
Nitrite: NEGATIVE
Protein, ur: NEGATIVE mg/dL
Specific Gravity, Urine: 1.013 (ref 1.005–1.030)
pH: 6 (ref 5.0–8.0)

## 2019-01-15 LAB — CBC WITH DIFFERENTIAL/PLATELET
Abs Immature Granulocytes: 0.04 10*3/uL (ref 0.00–0.07)
Basophils Absolute: 0.1 10*3/uL (ref 0.0–0.1)
Basophils Relative: 1 %
Eosinophils Absolute: 0.1 10*3/uL (ref 0.0–0.5)
Eosinophils Relative: 1 %
HCT: 39.6 % (ref 36.0–46.0)
Hemoglobin: 13.1 g/dL (ref 12.0–15.0)
Immature Granulocytes: 0 %
Lymphocytes Relative: 23 %
Lymphs Abs: 2.3 10*3/uL (ref 0.7–4.0)
MCH: 31.7 pg (ref 26.0–34.0)
MCHC: 33.1 g/dL (ref 30.0–36.0)
MCV: 95.9 fL (ref 80.0–100.0)
Monocytes Absolute: 0.5 10*3/uL (ref 0.1–1.0)
Monocytes Relative: 4 %
Neutro Abs: 7.2 10*3/uL (ref 1.7–7.7)
Neutrophils Relative %: 71 %
Platelets: 309 10*3/uL (ref 150–400)
RBC: 4.13 MIL/uL (ref 3.87–5.11)
RDW: 13.6 % (ref 11.5–15.5)
WBC: 10.2 10*3/uL (ref 4.0–10.5)
nRBC: 0 % (ref 0.0–0.2)

## 2019-01-15 LAB — COMPREHENSIVE METABOLIC PANEL
ALT: 12 U/L (ref 0–44)
AST: 22 U/L (ref 15–41)
Albumin: 4.2 g/dL (ref 3.5–5.0)
Alkaline Phosphatase: 58 U/L (ref 38–126)
Anion gap: 10 (ref 5–15)
BUN: 11 mg/dL (ref 6–20)
CO2: 23 mmol/L (ref 22–32)
Calcium: 9.6 mg/dL (ref 8.9–10.3)
Chloride: 107 mmol/L (ref 98–111)
Creatinine, Ser: 1 mg/dL (ref 0.44–1.00)
GFR calc Af Amer: >60 mL/min (ref >60)
GFR calc non Af Amer: >60 mL/min (ref >60)
Glucose, Bld: 97 mg/dL (ref 70–99)
Potassium: 3.9 mmol/L (ref 3.5–5.1)
Sodium: 140 mmol/L (ref 135–145)
Total Bilirubin: 0.4 mg/dL (ref 0.3–1.2)
Total Protein: 7.8 g/dL (ref 6.5–8.1)

## 2019-01-15 LAB — ETHANOL: Alcohol, Ethyl (B): <10 mg/dL (ref <10)

## 2019-01-15 LAB — SARS CORONAVIRUS 2 BY RT PCR (HOSPITAL ORDER, PERFORMED IN ~~LOC~~ HOSPITAL LAB): SARS Coronavirus 2: NEGATIVE

## 2019-01-15 LAB — I-STAT BETA HCG BLOOD, ED (MC, WL, AP ONLY): I-stat hCG, quantitative: <5 m[IU]/mL (ref <5)

## 2019-01-15 LAB — HCG, QUANTITATIVE, PREGNANCY: hCG, Beta Chain, Quant, S: <1 m[IU]/mL (ref <5)

## 2019-01-15 LAB — ABO/RH: ABO/RH(D): B POS

## 2019-01-15 MED ORDER — IOHEXOL 300 MG/ML  SOLN
100.0000 mL | Freq: Once | INTRAMUSCULAR | Status: AC | PRN
  Administered 2019-01-15: 21:00:00 100 mL via INTRAVENOUS

## 2019-01-15 MED ORDER — OXYCODONE HCL 5 MG PO TABS
5.0000 mg | ORAL_TABLET | Freq: Once | ORAL | Status: AC
  Administered 2019-01-15: 5 mg via ORAL
  Filled 2019-01-15: qty 1

## 2019-01-15 MED ORDER — OXYCODONE HCL 5 MG PO TABS
5.0000 mg | ORAL_TABLET | Freq: Once | ORAL | Status: AC
  Administered 2019-01-15: 18:00:00 5 mg via ORAL
  Filled 2019-01-15: qty 1

## 2019-01-15 MED ORDER — NICOTINE 21 MG/24HR TD PT24
21.0000 mg | MEDICATED_PATCH | Freq: Every day | TRANSDERMAL | Status: DC
  Administered 2019-01-15: 21 mg via TRANSDERMAL
  Filled 2019-01-15: qty 1

## 2019-01-15 MED ORDER — ALUM & MAG HYDROXIDE-SIMETH 200-200-20 MG/5ML PO SUSP: 30.0000 mL | Freq: Four times a day (QID) | ORAL | Status: DC | PRN

## 2019-01-15 NOTE — ED Notes (Signed)
pts belongings inventoried and placed in locker #5

## 2019-01-15 NOTE — Progress Notes (Signed)
Pt meets inpatient criteria per Denzil Magnuson, NP. Referral information has been sent to the following hospitals for review:  [facility]  [facility]  [facility]  [facility]  [facility]  [facility]  [facility]  [facility]  [facility]  [facility]  [facility]  [facility]   Disposition CSW will continue to assist with inpatient placement needs.   Wells Guiles, LCSW, LCAS Disposition CSW [facility]/TTS [phone]

## 2019-01-15 NOTE — ED Notes (Signed)
TTS at bedside. 
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
## 2019-01-15 NOTE — ED Notes (Signed)
Pt made a phone call to her mother at this time

## 2019-01-15 NOTE — [facility] Assessment (Signed)
[facility] Assessment Progress Note  Case was staffed with Maisie Fus FNP who recommended a inpatient admission to assist with stabilization.

## 2019-01-15 NOTE — ED Provider Notes (Signed)
I was asked to see the patient due to abdominal pain.  She is having left lower quadrant abdominal pain for a couple days.  She is not pregnant.  Given past history of tubo-ovarian abscess will get CT for further evaluation.  She denies any vaginal discharge.  Started to have a little bit of bleeding today.  CT scan is benign. Labs/ua benign. Will continue psych plan and I think her abdomen is medically cleared.  Results for orders placed or performed during the hospital encounter of 01/15/19  SARS Coronavirus 2 (CEPHEID - Performed in [facility] hospital lab), [facility] Order  Result Value Ref Range   SARS Coronavirus 2 NEGATIVE NEGATIVE  Comprehensive metabolic panel  Result Value Ref Range   Sodium 140 135 - 145 mmol/L   Potassium 3.9 3.5 - 5.1 mmol/L   Chloride 107 98 - 111 mmol/L   CO2 23 22 - 32 mmol/L   Glucose, Bld 97 70 - 99 mg/dL   BUN 11 6 - 20 mg/dL   Creatinine, Ser 9.48 0.44 - 1.00 mg/dL   Calcium 9.6 8.9 - 54.6 mg/dL   Total Protein 7.8 6.5 - 8.1 g/dL   Albumin 4.2 3.5 - 5.0 g/dL   AST 22 15 - 41 U/L   ALT 12 0 - 44 U/L   Alkaline Phosphatase 58 38 - 126 U/L   Total Bilirubin 0.4 0.3 - 1.2 mg/dL   GFR calc non Af Amer >60 >60 mL/min   GFR calc Af Amer >60 >60 mL/min   Anion gap 10 5 - 15  Ethanol  Result Value Ref Range   Alcohol, Ethyl (B) <10 <10 mg/dL  CBC with Diff  Result Value Ref Range   WBC 10.2 4.0 - 10.5 K/uL   RBC 4.13 3.87 - 5.11 MIL/uL   Hemoglobin 13.1 12.0 - 15.0 g/dL   HCT 27.0 35.0 - 09.3 %   MCV 95.9 80.0 - 100.0 fL   MCH 31.7 26.0 - 34.0 pg   MCHC 33.1 30.0 - 36.0 g/dL   RDW 81.8 29.9 - 37.1 %   Platelets 309 150 - 400 K/uL   nRBC 0.0 0.0 - 0.2 %   Neutrophils Relative % 71 %   Neutro Abs 7.2 1.7 - 7.7 K/uL   Lymphocytes Relative 23 %   Lymphs Abs 2.3 0.7 - 4.0 K/uL   Monocytes Relative 4 %   Monocytes Absolute 0.5 0.1 - 1.0 K/uL   Eosinophils Relative 1 %   Eosinophils Absolute 0.1 0.0 - 0.5 K/uL   Basophils Relative 1 %   Basophils  Absolute 0.1 0.0 - 0.1 K/uL   Immature Granulocytes 0 %   Abs Immature Granulocytes 0.04 0.00 - 0.07 K/uL  hCG, quantitative, pregnancy  Result Value Ref Range   hCG, Beta Chain, Quant, S <1 <5 mIU/mL  Urinalysis, Routine w reflex microscopic  Result Value Ref Range   Color, Urine YELLOW YELLOW   APPearance CLEAR CLEAR   Specific Gravity, Urine 1.013 1.005 - 1.030   pH 6.0 5.0 - 8.0   Glucose, UA NEGATIVE NEGATIVE mg/dL   Hgb urine dipstick MODERATE (A) NEGATIVE   Bilirubin Urine NEGATIVE NEGATIVE   Ketones, ur NEGATIVE NEGATIVE mg/dL   Protein, ur NEGATIVE NEGATIVE mg/dL   Nitrite NEGATIVE NEGATIVE   Leukocytes,Ua NEGATIVE NEGATIVE   WBC, UA 0-5 0 - 5 WBC/hpf   Bacteria, UA NONE SEEN NONE SEEN   Squamous Epithelial / LPF 0-5 0 - 5   Mucus PRESENT  Hyaline Casts, UA PRESENT   I-Stat beta hCG blood, ED  Result Value Ref Range   I-stat hCG, quantitative <5.0 <5 mIU/mL   Comment 3          ABO/Rh  Result Value Ref Range   ABO/RH(D) B POS    No rh immune globuloin      NOT A RH IMMUNE GLOBULIN CANDIDATE, PT RH POSITIVE Performed at [facility], [address] [place], [place] [address]    Ct Abdomen Pelvis W Contrast  Result Date: 01/15/2019 CLINICAL DATA:  Abdominal pain. History of pelvic inflammatory disease EXAM: CT ABDOMEN AND PELVIS WITH CONTRAST TECHNIQUE: Multidetector CT imaging of the abdomen and pelvis was performed using the standard protocol following bolus administration of intravenous contrast. CONTRAST:  100mL OMNIPAQUE IOHEXOL 300 MG/ML  SOLN COMPARISON:  June 19, 2018 FINDINGS: Lower chest: Lung bases are clear. Hepatobiliary: No focal liver lesions are evident. Gallbladder wall is not appreciably thickened. There is no biliary duct dilatation. Pancreas: There is no pancreatic mass or inflammatory focus. Spleen: No splenic lesions evident. Adrenals/Urinary Tract: Adrenals bilaterally appear unremarkable. Kidneys bilaterally show no evident mass or  hydronephrosis. There is no evident renal or ureteral calculus on either side. Urinary bladder is midline with wall thickness within normal limits. Stomach/Bowel: There is no appreciable bowel wall or mesenteric thickening. There is moderate stool throughout the colon. There is no evident bowel obstruction. The terminal ileum appears unremarkable. There is no free air or portal venous air evident. Vascular/Lymphatic: There is no abdominal aortic aneurysm. No vascular lesions are evident. There is no adenopathy appreciable in the abdomen or pelvis. Reproductive: The uterus is anteverted. The previously noted right pelvic mass is no longer evident. Currently the ovaries appear unremarkable by CT with respect to size and contour. No pelvic mass is demonstrable. There appear to be follicles throughout each ovary. Other: The appendix appears normal. There is no evident abscess or ascites in the abdomen or pelvis. Musculoskeletal: There are no blastic or lytic bone lesions. There is no evident intramuscular or abdominal wall lesion. IMPRESSION: 1. No pelvic mass is appreciable by CT. The apparent tubo-ovarian abscess noted previously by CT is not evident currently. There is no abnormal fluid in the pelvis. 2. No demonstrable bowel obstruction. No free air or portal venous air. Appendix appears normal. 3. No evident renal or ureteral calculus. No hydronephrosis. Urinary bladder wall thickness is within normal limits. Electronically Signed   By: Bretta BangWilliam  Woodruff III M.D.   On: 01/15/2019 21:23      Paula Lucas, Paula Chisenhall, MD 01/15/19 2228
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
## 2019-01-15 NOTE — ED Notes (Signed)
Called staffing office about IVC sitter. Sitter not available at this time. Sitter will be present at 1500.

## 2019-01-15 NOTE — ED Notes (Signed)
Wanded by security 

## 2019-01-15 NOTE — ED Notes (Signed)
Got patient into a gown on the monitor patient is resting with call bell in reach and [employer] outside the door
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
## 2019-01-15 NOTE — ED Notes (Signed)
Pt changed into purple scrubs 

## 2019-01-15 NOTE — ED Notes (Signed)
IVC faxed to [facility], originals in folder, copy in medical records, wanded by security

## 2019-01-16 LAB — RAPID URINE DRUG SCREEN, HOSP PERFORMED
Amphetamines: NOT DETECTED
Barbiturates: NOT DETECTED
Benzodiazepines: NOT DETECTED
Cocaine: NOT DETECTED
Opiates: NOT DETECTED
Tetrahydrocannabinol: POSITIVE — AB

## 2019-01-16 NOTE — ED Notes (Signed)
Breakfast ordered 

## 2019-01-16 NOTE — ED Notes (Signed)
Pt showered and is now on phone at nurses' desk.
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
## 2019-01-16 NOTE — ED Notes (Addendum)
ALL belongings - 1 labeled belongings bag - given to [employer]. Pt had Letter to Disclose from [employer]. IVC papers rescinded by Dr Jeraldine Loots. Copy faxed to [employer] - copy sent to Medical Records - original placed in folder for [employer].  Pt unable to sign signature pad. D/C paperwork given to [employer].

## 2019-01-16 NOTE — ED Notes (Signed)
States she is wanting a cigarette and Nicotine Patch is not helping. States she wants to be d/c'd and she is being kept d/t her hx of Schizophrenia, PTSD, and Bipolar. Denies SI/HI. Pt noted to be tearful and states she is anxious d/t was advised last pm she may be able to be d/c'd this am. Advised pt [facility] SW is aware she is awake now and will talk w/her soon. Voiced understanding.

## 2019-01-16 NOTE — BH Assessment (Signed)
[facility] Assessment Progress Note   Attempted to contact Caitlyn, PA and advise of disposition, called x 2, no answer.  Spoke to [facility] who agreed to inform PA of disposition.

## 2019-05-10 ENCOUNTER — Ambulatory Visit: Payer: Medicaid Other | Admitting: Family Medicine

## 2019-05-23 ENCOUNTER — Encounter (HOSPITAL_COMMUNITY): Payer: Self-pay

## 2019-05-23 ENCOUNTER — Ambulatory Visit (HOSPITAL_COMMUNITY)
Admission: EM | Admit: 2019-05-23 | Discharge: 2019-05-23 | Disposition: A | Discharge status: Home / Self Care | Payer: Medicaid Other | Attending: Family Medicine | Admitting: Family Medicine

## 2019-05-23 DIAGNOSIS — N898 Other specified noninflammatory disorders of vagina: Secondary | ICD-10-CM | POA: Insufficient documentation

## 2019-05-23 DIAGNOSIS — Z3202 Encounter for pregnancy test, result negative: Secondary | ICD-10-CM | POA: Diagnosis not present

## 2019-05-23 DIAGNOSIS — K0889 Other specified disorders of teeth and supporting structures: Secondary | ICD-10-CM | POA: Insufficient documentation

## 2019-05-23 LAB — POCT PREGNANCY, URINE: Preg Test, Ur: NEGATIVE

## 2019-05-23 LAB — POCT URINALYSIS DIP (DEVICE)
Bilirubin Urine: NEGATIVE
Glucose, UA: NEGATIVE mg/dL
Hgb urine dipstick: NEGATIVE
Ketones, ur: NEGATIVE mg/dL
Leukocytes,Ua: NEGATIVE
Nitrite: NEGATIVE
Protein, ur: NEGATIVE mg/dL
Specific Gravity, Urine: 1.02 (ref 1.005–1.030)
Urobilinogen, UA: 0.2 mg/dL (ref 0.0–1.0)
pH: 7 (ref 5.0–8.0)

## 2019-05-23 MED ORDER — PENICILLIN V POTASSIUM 500 MG PO TABS: 500.0000 mg | ORAL_TABLET | Freq: Three times a day (TID) | ORAL | 0 refills | Status: DC

## 2019-05-23 MED ORDER — TRAMADOL HCL 50 MG PO TABS: 50.0000 mg | ORAL_TABLET | Freq: Four times a day (QID) | ORAL | 0 refills | Status: DC | PRN

## 2019-05-23 NOTE — ED Triage Notes (Signed)
Pt report she is having sexual intercourse without protection, strong vaginal odor, she denies any vaginal discharge  X 3 days, dental pain x 2 weeks.

## 2019-05-23 NOTE — Discharge Instructions (Signed)
We have sent testing for sexually transmitted infections. We will notify you of any positive results once they are received. If required, we will prescribe any medications you might need.  Please refrain from all sexual activity for at least the next seven days.  Be aware, pain medications may cause drowsiness. Please do not drive, operate heavy machinery or make important decisions while on this medication, it can cloud your judgement.

## 2019-05-24 ENCOUNTER — Telehealth (HOSPITAL_COMMUNITY): Payer: Self-pay

## 2019-05-24 MED ORDER — PENICILLIN V POTASSIUM 500 MG PO TABS: 500.0000 mg | ORAL_TABLET | Freq: Three times a day (TID) | ORAL | 0 refills | Status: DC

## 2019-05-24 NOTE — ED Provider Notes (Signed)
[place]   [id number] 05/23/19 Arrival Time: 7616  ASSESSMENT & PLAN:  1. Vaginal odor   2. Pain, dental       Discharge Instructions     We have sent testing for sexually transmitted infections. We will notify you of any positive results once they are received. If required, we will prescribe any medications you might need.  Please refrain from all sexual activity for at least the next seven days.  Be aware, pain medications may cause drowsiness. Please do not drive, operate heavy machinery or make important decisions while on this medication, it can cloud your judgement.    Declines HIV/RPR testing.  For dental pain: Meds ordered this encounter  Medications  . penicillin v potassium (VEETID) 500 MG tablet    Sig: Take 1 tablet (500 mg total) by mouth 3 (three) times daily.    Dispense:  30 tablet    Refill:  0  . traMADol (ULTRAM) 50 MG tablet    Sig: Take 1 tablet (50 mg total) by mouth every 6 (six) hours as needed.    Dispense:  8 tablet    Refill:  0   Recommend dental f/u if not improving.  Pending: Labs Reviewed  POCT URINALYSIS DIP (DEVICE)  POCT PREGNANCY, URINE  CERVICOVAGINAL ANCILLARY ONLY    Will notify of any positive results. Instructed to refrain from sexual activity for at least seven days.  Reviewed expectations re: course of current medical issues. Questions answered. Outlined signs and symptoms indicating need for more acute intervention. Patient verbalized understanding. After Visit Summary given.   SUBJECTIVE:  Paula Lucas is a 28 y.o. female who presents with complaint of vaginal odor. Onset gradual. First noticed "maybe this week". No vaginal discharge reported. Denies: urinary frequency, hematuria, urinary hesitancy, chills and sweats. Afebrile. No abdominal or pelvic pain. Normal PO intake wihout n/v. No rashes or lesions. Reports that she is sexually active with single female partner without condom use. OTC  treatment: none. History of STI: No.  No LMP recorded.  ROS: As per HPI.  OBJECTIVE:  Vitals:   05/23/19 1628  BP: 127/83  Resp: 16  Temp: 97.9 F (36.6 C)  TempSrc: Temporal     General appearance: alert, cooperative, appears stated age and no distress Throat: lips, mucosa, and tongue normal; teeth and gums normal; does report tenderness to L upper gums; no fluctuance or drainage CV: RRR Lungs: CTAB Back: no CVA tenderness; FROM at waist Abdomen: soft, non-tender GU: deferred Skin: warm and dry Psychological: alert and cooperative; normal mood and affect.  Results for orders placed or performed during the hospital encounter of 05/23/19  POCT urinalysis dip (device)  Result Value Ref Range   Glucose, UA NEGATIVE NEGATIVE mg/dL   Bilirubin Urine NEGATIVE NEGATIVE   Ketones, ur NEGATIVE NEGATIVE mg/dL   Specific Gravity, Urine 1.020 1.005 - 1.030   Hgb urine dipstick NEGATIVE NEGATIVE   pH 7.0 5.0 - 8.0   Protein, ur NEGATIVE NEGATIVE mg/dL   Urobilinogen, UA 0.2 0.0 - 1.0 mg/dL   Nitrite NEGATIVE NEGATIVE   Leukocytes,Ua NEGATIVE NEGATIVE  Pregnancy, urine POC  Result Value Ref Range   Preg Test, Ur NEGATIVE NEGATIVE    Labs Reviewed  POCT URINALYSIS DIP (DEVICE)  POCT PREGNANCY, URINE  CERVICOVAGINAL ANCILLARY ONLY    Allergies  Allergen Reactions  . Fish Allergy Anaphylaxis    Severe allergic reaction to all seafoods  . Ibuprofen Anaphylaxis    Pt tolerated Ketorolac 04/02/18  .  Mushroom Extract Complex Anaphylaxis  . Shellfish-Derived Products Anaphylaxis  . Tylenol [Acetaminophen] Anaphylaxis    Past Medical History:  Diagnosis Date  . Asthma   . Asthma    Family History  Problem Relation Age of Onset  . Diabetes Mother   . Hypertension Mother   . Diabetes Father   . Hypertension Father   . Diabetes Sister   . Hypertension Sister    Social History   Socioeconomic History  . Marital status: Single    Spouse name: Not on file  . Number  of children: Not on file  . Years of education: Not on file  . Highest education level: Not on file  Occupational History  . Occupation: UNEMPLOYED  Social Needs  . Financial resource strain: Somewhat hard  . Food insecurity    Worry: Sometimes true    Inability: Sometimes true  . Transportation needs    Medical: Yes    Non-medical: Yes  Tobacco Use  . Smoking status: Smoker, Current Status Unknown    Packs/day: 0.50    Years: 9.00    Pack years: 4.50    Types: Cigarettes  . Smokeless tobacco: Never Used  Substance and Sexual Activity  . Alcohol use: No  . Drug use: Yes    Types: Marijuana    Comment: THC GUMMIES LAST MONTH  . Sexual activity: Yes  Lifestyle  . Physical activity    Days per week: 1 day    Minutes per session: 120 min  . Stress: Not at all  Relationships  . Social Musician on phone: Once a week    Gets together: Once a week    Attends religious service: Not on file    Active member of club or organization: No    Attends meetings of clubs or organizations: Never    Relationship status: Never married  . Intimate partner violence    Fear of current or ex partner: No    Emotionally abused: No    Physically abused: No    Forced sexual activity: No  Other Topics Concern  . Not on file  Social History Narrative  . Not on file          Mardella Layman, MD 05/24/19 [phone]

## 2019-05-25 ENCOUNTER — Telehealth (HOSPITAL_COMMUNITY): Payer: Self-pay | Admitting: Emergency Medicine

## 2019-05-25 ENCOUNTER — Telehealth (HOSPITAL_COMMUNITY): Payer: Self-pay | Admitting: Family Medicine

## 2019-05-25 MED ORDER — TRAMADOL HCL 50 MG PO TABS: 50.0000 mg | ORAL_TABLET | Freq: Four times a day (QID) | ORAL | 0 refills | Status: DC | PRN

## 2019-05-25 MED ORDER — FLUCONAZOLE 150 MG PO TABS: 150.0000 mg | ORAL_TABLET | Freq: Once | ORAL | 0 refills | Status: AC

## 2019-05-25 MED ORDER — METRONIDAZOLE 500 MG PO TABS: 500.0000 mg | ORAL_TABLET | Freq: Two times a day (BID) | ORAL | 0 refills | Status: AC

## 2019-05-25 NOTE — Telephone Encounter (Signed)
Bacterial vaginosis is positive. This was not treated at the urgent care visit.  Flagyl 500 mg BID x 7 days #14 no refills sent to patients pharmacy of choice.    Test for candida (yeast) was positive.  Prescription for fluconazole 150mg  po now, repeat dose in 3d if needed, #2 no refills, sent to the pharmacy of record.  Recheck or followup with PCP for further evaluation if symptoms are not improving.    Pending G/C  Patient contacted and made aware of    results, all questions answered  Pt requesting tramadol sent to [place]. [facility] called to cancel tramadol script.

## 2019-05-25 NOTE — Telephone Encounter (Signed)
Med sent to wrong pharmacy.  RE done

## 2019-05-30 LAB — CERVICOVAGINAL ANCILLARY ONLY
Bacterial Vaginitis (gardnerella): POSITIVE — AB
Candida Glabrata: NEGATIVE
Candida Vaginitis: POSITIVE — AB
Chlamydia: NEGATIVE
Neisseria Gonorrhea: NEGATIVE
Trichomonas: NEGATIVE

## 2019-06-12 ENCOUNTER — Other Ambulatory Visit: Payer: Self-pay

## 2019-06-12 ENCOUNTER — Encounter (HOSPITAL_COMMUNITY): Payer: Self-pay | Admitting: Emergency Medicine

## 2019-06-12 ENCOUNTER — Ambulatory Visit (HOSPITAL_COMMUNITY)
Admission: EM | Admit: 2019-06-12 | Discharge: 2019-06-12 | Disposition: A | Discharge status: Home / Self Care | Payer: Medicaid Other | Attending: Family Medicine | Admitting: Family Medicine

## 2019-06-12 DIAGNOSIS — Z20828 Contact with and (suspected) exposure to other viral communicable diseases: Secondary | ICD-10-CM | POA: Insufficient documentation

## 2019-06-12 DIAGNOSIS — Z20822 Contact with and (suspected) exposure to covid-19: Secondary | ICD-10-CM

## 2019-06-12 NOTE — ED Triage Notes (Signed)
Patient reports her boyfriends family has tested positive for covid.  Boyfriend has not.  Patient denies symptoms

## 2019-06-12 NOTE — Discharge Instructions (Addendum)
If your Covid-19 test is positive, you will get a phone call from [facility] regarding your results. If your Covid-19 test is negative, you will NOT get a phone call from [facility] with your results. You may view your results on MyChart. If you do not have a MyChart account, sign up instructions are in your discharge papers. ° °

## 2019-06-13 NOTE — ED Provider Notes (Signed)
[facility]   [id number] 06/12/19 Arrival Time: 1910  ASSESSMENT & PLAN:  1. Exposure to COVID-19 virus     COVID-19 testing sent. To self-quarantine until results are available.  Follow-up Information    [facility]   Specialty: Urgent Care Why: As needed. Contact information: [address] [place] [address] [phone]          Reviewed expectations re: course of current medical issues. Questions answered. Outlined signs and symptoms indicating need for more acute intervention. Patient verbalized understanding. After Visit Summary given.   SUBJECTIVE: History from: patient. Paula Lucas is a 28 y.o. female who requests COVID-19 testing. Known COVID-19 contact: family member. Recent travel: none. Denies: runny nose, congestion, fever, cough, sore throat, difficulty breathing and headache.  ROS: As per HPI.   OBJECTIVE:  Vitals:   06/12/19 1951  BP: 108/75  Pulse: 88  Resp: 17  Temp: 97.6 F (36.4 C)  TempSrc: Tympanic  SpO2: 100%    General appearance: alert; no distress Eyes: PERRLA; EOMI; conjunctiva normal HENT: [place]; AT; nasal mucosa normal; oral mucosa normal Neck: supple  Lungs: clear to auscultation bilaterally; unlabored Heart: regular rate and rhythm Abdomen: soft Extremities: no edema Skin: warm and dry Neurologic: normal gait Psychological: alert and cooperative; normal mood and affect  Labs: Labs Reviewed  NOVEL CORONAVIRUS, NAA (HOSP ORDER, SEND-OUT TO REF LAB; TAT 18-24 HRS)     Allergies  Allergen Reactions  . Fish Allergy Anaphylaxis    Severe allergic reaction to all seafoods  . Ibuprofen Anaphylaxis    Pt tolerated Ketorolac 04/02/18  . Mushroom Extract Complex Anaphylaxis  . Shellfish-Derived Products Anaphylaxis  . Tylenol [Acetaminophen] Anaphylaxis    Past Medical History:  Diagnosis Date  . Asthma   . Asthma    Social History    Socioeconomic History  . Marital status: Single    Spouse name: Not on file  . Number of children: Not on file  . Years of education: Not on file  . Highest education level: Not on file  Occupational History  . Occupation: UNEMPLOYED  Social Needs  . Financial resource strain: Somewhat hard  . Food insecurity    Worry: Sometimes true    Inability: Sometimes true  . Transportation needs    Medical: Yes    Non-medical: Yes  Tobacco Use  . Smoking status: Smoker, Current Status Unknown    Packs/day: 0.50    Years: 9.00    Pack years: 4.50    Types: Cigarettes  . Smokeless tobacco: Never Used  Substance and Sexual Activity  . Alcohol use: No  . Drug use: Yes    Types: Marijuana    Comment: THC GUMMIES LAST MONTH  . Sexual activity: Yes  Lifestyle  . Physical activity    Days per week: 1 day    Minutes per session: 120 min  . Stress: Not at all  Relationships  . Social Musician on phone: Once a week    Gets together: Once a week    Attends religious service: Not on file    Active member of club or organization: No    Attends meetings of clubs or organizations: Never    Relationship status: Never married  . Intimate partner violence    Fear of current or ex partner: No    Emotionally abused: No    Physically abused: No    Forced sexual activity: No  Other  Topics Concern  . Not on file  Social History Narrative  . Not on file   Family History  Problem Relation Age of Onset  . Diabetes Mother   . Hypertension Mother   . Diabetes Father   . Hypertension Father   . Diabetes Sister   . Hypertension Sister    Past Surgical History:  Procedure Laterality Date  . INDUCED ABORTION    . IR RADIOLOGIST EVAL & MGMT  05/17/2018  . Evonnie Dawes, MD 06/13/19 [phone]

## 2019-06-14 LAB — NOVEL CORONAVIRUS, NAA (HOSP ORDER, SEND-OUT TO REF LAB; TAT 18-24 HRS): SARS-CoV-2, NAA: NOT DETECTED

## 2019-06-27 ENCOUNTER — Other Ambulatory Visit: Payer: Self-pay

## 2019-06-27 ENCOUNTER — Ambulatory Visit: Payer: Medicaid Other | Attending: Family Medicine | Admitting: Pharmacist

## 2019-06-27 ENCOUNTER — Ambulatory Visit: Payer: Medicaid Other | Admitting: Family Medicine

## 2019-06-27 DIAGNOSIS — Z23 Encounter for immunization: Secondary | ICD-10-CM

## 2019-06-27 NOTE — Progress Notes (Signed)
Patient presents for vaccination against tetanus per orders of Dr. Fulp. Consent given. Counseling provided. No contraindications exists. Vaccine administered without incident.   

## 2019-10-17 ENCOUNTER — Encounter: Payer: Self-pay | Admitting: Family Medicine

## 2019-10-28 ENCOUNTER — Encounter: Payer: Self-pay | Admitting: Family Medicine

## 2019-10-31 ENCOUNTER — Ambulatory Visit: Payer: Medicaid Other | Admitting: Family Medicine

## 2019-11-12 ENCOUNTER — Encounter: Payer: Self-pay | Admitting: Family Medicine

## 2020-05-02 IMAGING — CT CT ABD-PELV W/ CM
2 of 4 series · 15 of 46 positions shown, 17 images · IV contrast (iopamidol)
Comparison: Prior CT scan of the abdomen and pelvis 04/01/2018

CLINICAL DATA: 27-year-old female with a history of left
tubo-ovarian abscess status post percutaneous drain placement on
04/04/2018. She presents today for follow-up imaging. She recently
rolled under her drainage tube placing tension on the tube and
resulting in moderate left lower quadrant pain. She is currently
asymptomatic and afebrile and is flushing her tube. Drain output has
been insignificant for many days. She continues to take doxycycline.

EXAM:
CT ABDOMEN AND PELVIS WITH CONTRAST
TECHNIQUE: Multidetector CT imaging of the abdomen and pelvis was performed
using the standard protocol following bolus administration of
intravenous contrast.
CONTRAST:  100mL ITWH0N-RII IOPAMIDOL (ITWH0N-RII) INJECTION 61%

[Series 2: abd pelvis 5.00 br40 s3 ax · axial · 0.70mm/px · z∈[+1400,+1800]mm · 12 of 92 slices shown, 14 images]
[im 8/92  soft-tissue]
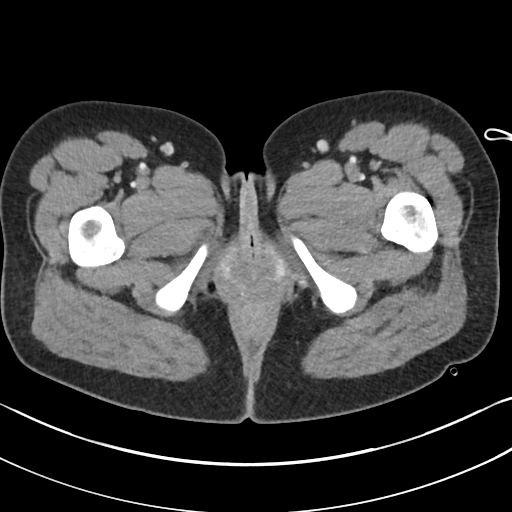
[im 8/92  bone]
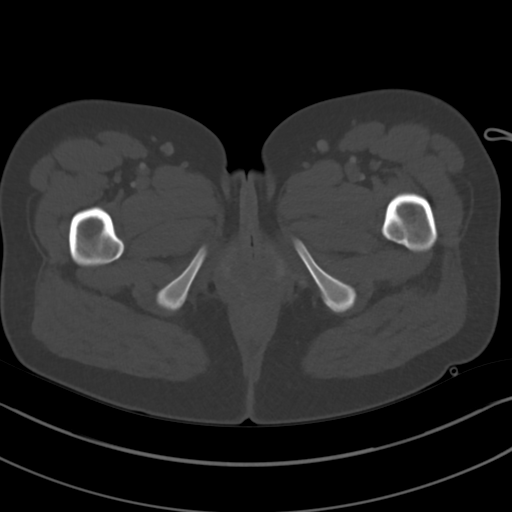
[im 15/92  soft-tissue]
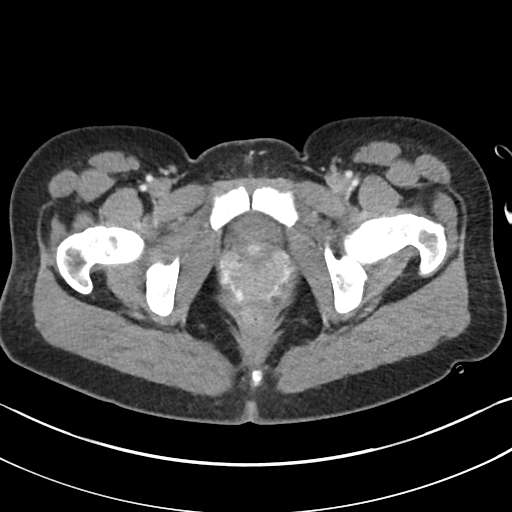
[im 22/92  soft-tissue]
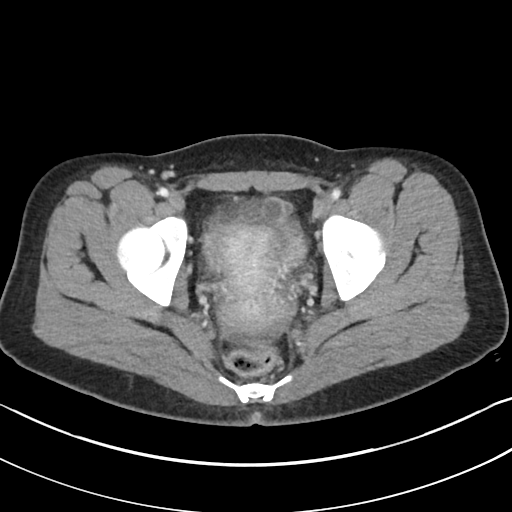
[im 30/92  soft-tissue]
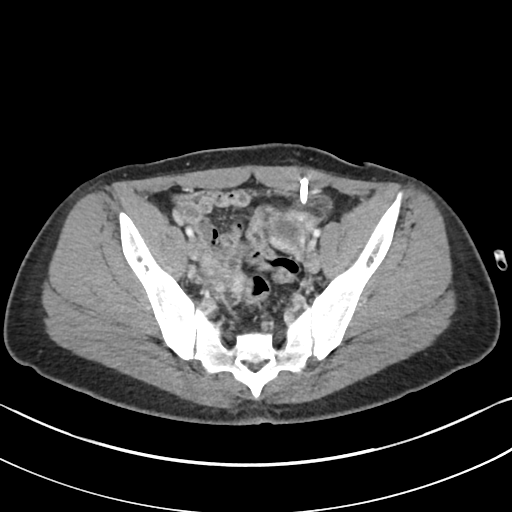
[im 37/92  soft-tissue]
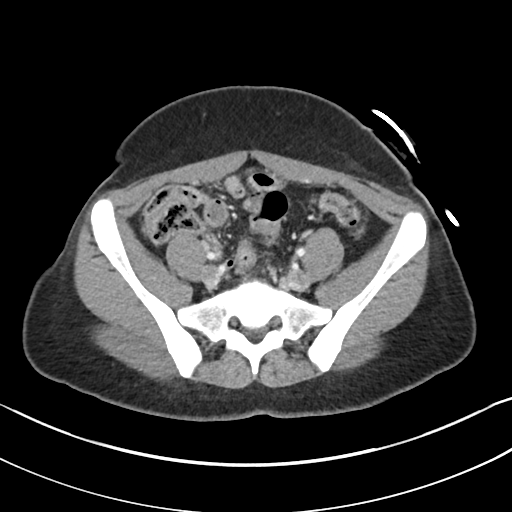
[im 44/92  soft-tissue]
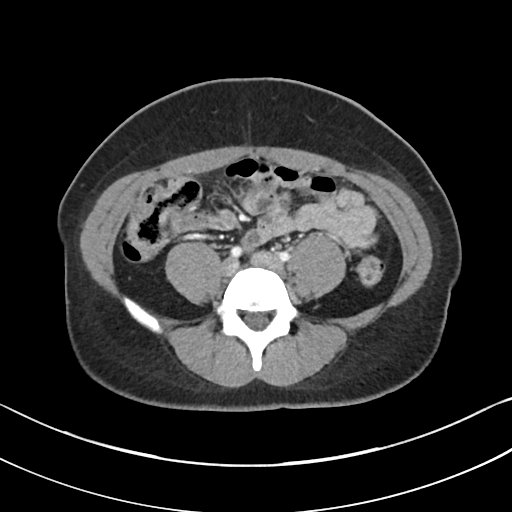
[im 51/92  soft-tissue]
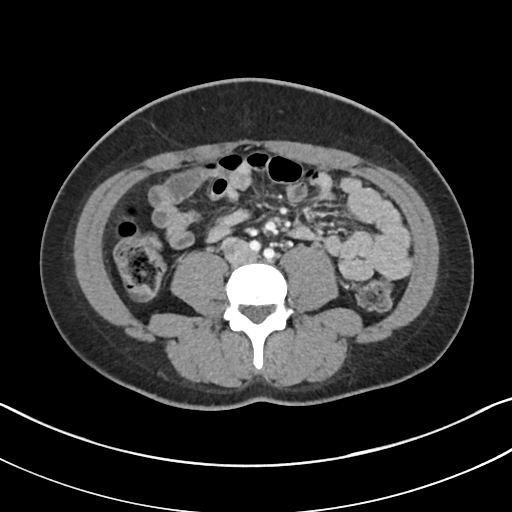
[im 59/92  soft-tissue]
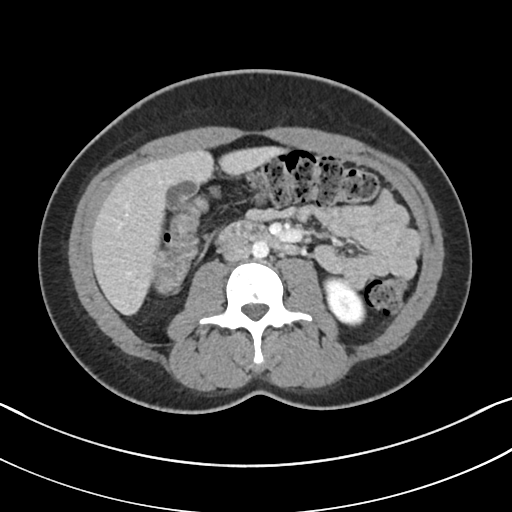
[im 66/92  soft-tissue]
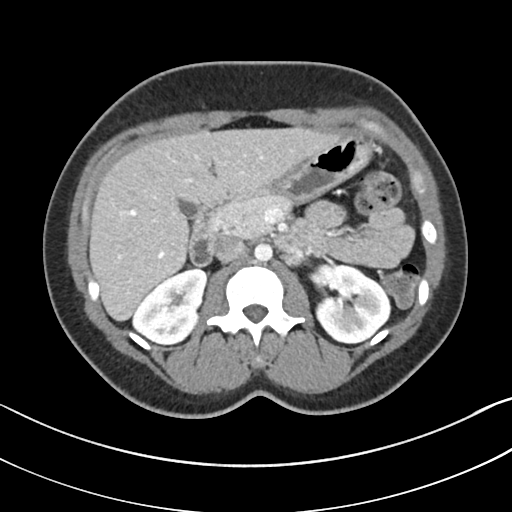
[im 66/92  bone]
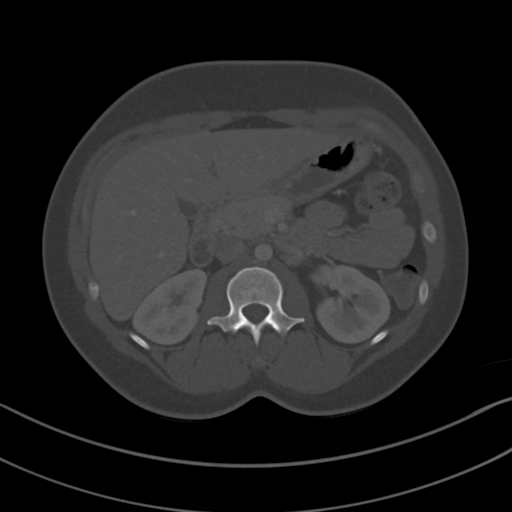
[im 73/92  soft-tissue]
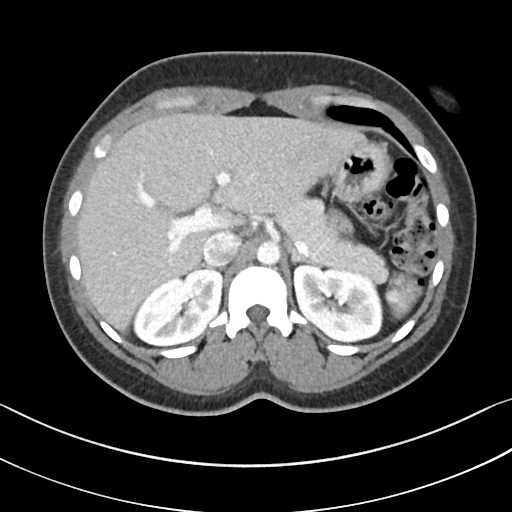
[im 81/92  soft-tissue]
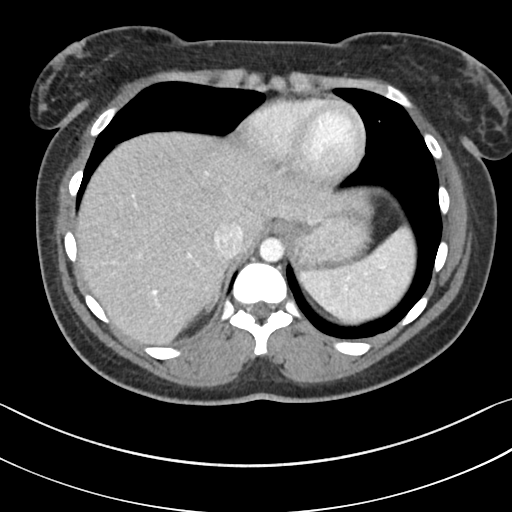
[im 88/92  soft-tissue]
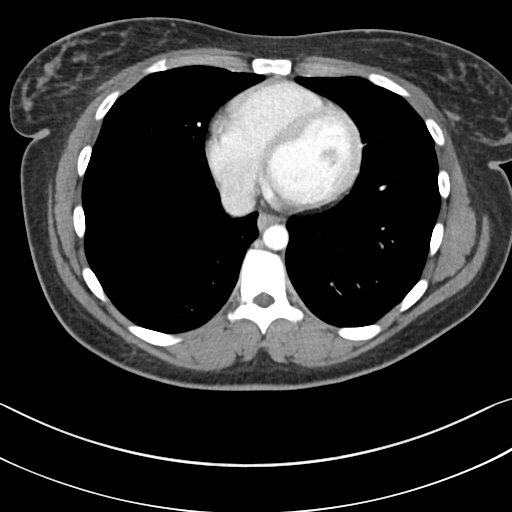

[Series 6: abd pelvis 2.00 br40 s3 cor · coronal · 0.70mm/px · 3 of 175 slices shown]
[im 59/175  soft-tissue]
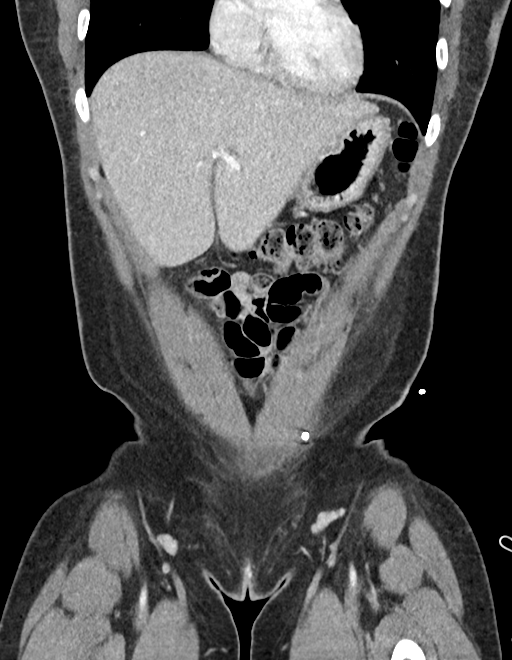
[im 78/175  soft-tissue]
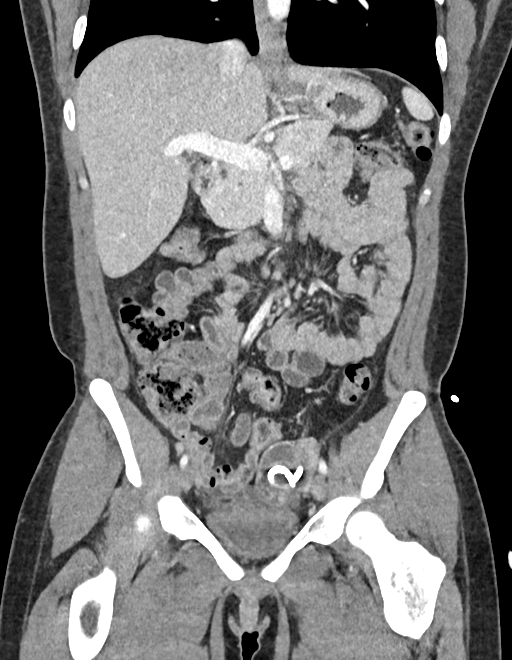
[im 97/175  soft-tissue]
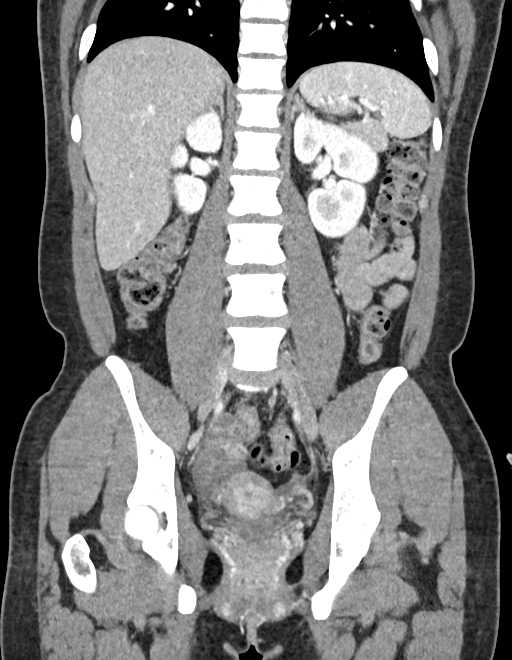

[15 of 46 positions shown; findings below may reference images not displayed]

FINDINGS: Lower chest: No acute abnormality.

Hepatobiliary: No focal liver abnormality is seen. No gallstones,
gallbladder wall thickening, or biliary dilatation.

Pancreas: Unremarkable. No pancreatic ductal dilatation or
surrounding inflammatory changes.

Spleen: Normal in size without focal abnormality.

Adrenals/Urinary Tract: Adrenal glands are unremarkable. Kidneys are
normal, without renal calculi, focal lesion, or hydronephrosis.
Bladder is unremarkable.

Stomach/Bowel: Stomach is within normal limits. Appendix appears
normal. No evidence of bowel wall thickening, distention, or
inflammatory changes.

Vascular/Lymphatic: No significant vascular findings are present. No
enlarged abdominal or pelvic lymph nodes.

Reproductive: The uterus remains unremarkable in appearance. The
right ovary is unremarkable with interval resolution of the right
ovarian cyst and right parovarian fluid collection. The drainage
catheter remains within good position in a left ovarian cyst which
has significantly decreased in size now measuring approximately
x 2.3 cm compared to 5.5 x 4.3 cm previously. No significant
surrounding inflammatory change.

Other: No abdominal wall hernia or abnormality. No abdominopelvic
ascites.

Musculoskeletal: No acute fracture or aggressive appearing lytic or
blastic osseous lesion.
IMPRESSION: 1. Well-positioned drainage catheter within the significantly
reduced left ovarian cyst/abscess. The residual fluid collection
does not demonstrate any significant surrounding inflammatory
changes and may represent a residual follicular cyst.
2. Interval resolution of right ovarian cyst/abscess and complex
pelvic free fluid and associated inflammatory changes.
3. No new or acute abnormality.

## 2020-05-28 ENCOUNTER — Emergency Department (HOSPITAL_COMMUNITY): Payer: Medicaid Other

## 2020-05-28 ENCOUNTER — Telehealth: Payer: Self-pay | Admitting: Unknown Physician Specialty

## 2020-05-28 ENCOUNTER — Other Ambulatory Visit: Payer: Self-pay

## 2020-05-28 ENCOUNTER — Other Ambulatory Visit: Payer: Self-pay | Admitting: Unknown Physician Specialty

## 2020-05-28 ENCOUNTER — Emergency Department (HOSPITAL_COMMUNITY)
Admission: EM | Admit: 2020-05-28 | Discharge: 2020-05-28 | Disposition: A | Discharge status: Home / Self Care | Payer: Medicaid Other | Attending: Emergency Medicine | Admitting: Emergency Medicine

## 2020-05-28 DIAGNOSIS — F17210 Nicotine dependence, cigarettes, uncomplicated: Secondary | ICD-10-CM | POA: Insufficient documentation

## 2020-05-28 DIAGNOSIS — J45909 Unspecified asthma, uncomplicated: Secondary | ICD-10-CM | POA: Diagnosis not present

## 2020-05-28 DIAGNOSIS — U071 COVID-19: Secondary | ICD-10-CM

## 2020-05-28 DIAGNOSIS — R0602 Shortness of breath: Secondary | ICD-10-CM | POA: Diagnosis present

## 2020-05-28 LAB — RESPIRATORY PANEL BY RT PCR (FLU A&B, COVID)
Influenza A by PCR: NEGATIVE
Influenza B by PCR: NEGATIVE
SARS Coronavirus 2 by RT PCR: POSITIVE — AB

## 2020-05-28 NOTE — Discharge Instructions (Addendum)
You tested positive today for COVID-19.  Obtain a portable pulse ox to monitor your oxygen saturations.  You may qualify for monoclonal antibody.  You will receive a phone call regarding this treatment.  If you have it develop new or worsening symptoms, you should be reevaluated.

## 2020-05-28 NOTE — ED Provider Notes (Signed)
[facility]-EMERGENCY DEPT Provider Note   CSN: [id number] Arrival date & time: 05/28/20  0001     History Chief Complaint  Patient presents with  . Shortness of Breath    Patient states that she loss her sense of taste yesterday and today she became sob.    Paula Lucas is a 29 y.o. female.  HPI     This is a 29 year old female with a history of asthma who presents with shortness of breath and chest discomfort.  Patient reports over the last 2 to 3 days she has noted a loss of sense of taste and smell.  She has received 1 COVID-19 vaccination.  She woke up from sleep tonight and felt like she could not catch her breath.  This alarmed her.  Unknown sick contacts or Covid exposures.  No chills or fevers.  She has reported a cough.  Denies chest pain, lower extremity swelling.  Past Medical History:  Diagnosis Date  . Asthma   . Asthma     Patient Active Problem List   Diagnosis Date Noted  . Tubo-ovarian abscess 06/19/2018  . Right tubo-ovarian abscess 06/19/2018  . Neisseria gonorrheae 03/06/2018 04/02/2018  . TOA (tubo-ovarian abscess) 04/01/2018  . BV (bacterial vaginosis) 04/01/2018  . Left ovarian cyst 04/16/2011    Past Surgical History:  Procedure Laterality Date  . INDUCED ABORTION    . IR RADIOLOGIST EVAL & MGMT  05/17/2018  . TONSILLECTOMY       OB History    Gravida  1   Para  0   Term  0   Preterm  0   AB  1   Living  0     SAB  0   TAB  1   Ectopic  0   Multiple  0   Live Births              Family History  Problem Relation Age of Onset  . Diabetes Mother   . Hypertension Mother   . Diabetes Father   . Hypertension Father   . Diabetes Sister   . Hypertension Sister     Social History   Tobacco Use  . Smoking status: Smoker, Current Status Unknown    Packs/day: 0.50    Years: 9.00    Pack years: 4.50    Types: Cigarettes  . Smokeless tobacco: Never Used  Substance Use Topics  . Alcohol use:  No  . Drug use: Yes    Types: Marijuana    Comment: THC GUMMIES LAST MONTH    Home Medications Prior to Admission medications   Medication Sig Start Date End Date Taking? Authorizing Provider  Lurasidone HCl (LATUDA) 60 MG TABS Take by mouth.    [provider]  penicillin v potassium (VEETID) 500 MG tablet Take 1 tablet (500 mg total) by mouth 3 (three) times daily. 05/24/19   Mardella Layman, MD  traMADol (ULTRAM) 50 MG tablet Take 1 tablet (50 mg total) by mouth every 6 (six) hours as needed. 05/25/19   Eustace Moore, MD    Allergies    Fish allergy, Ibuprofen, Mushroom extract complex, Shellfish-derived products, and Tylenol [acetaminophen]  Review of Systems   Review of Systems  Constitutional: Negative for fever.  Respiratory: Positive for cough and shortness of breath.   Cardiovascular: Negative for chest pain.  Gastrointestinal: Negative for abdominal pain, nausea and vomiting.  Genitourinary: Negative for dysuria.  All other systems reviewed and are negative.  Physical Exam Updated Vital Signs BP (!) 151/78   Pulse 82   Temp 98.6 F (37 C) (Oral)   Resp 18   SpO2 97%   Physical Exam Vitals and nursing note reviewed.  Constitutional:      Appearance: She is well-developed. She is obese. She is not ill-appearing.  HENT:     Head: Normocephalic and atraumatic.  Eyes:     Pupils: Pupils are equal, round, and reactive to light.  Cardiovascular:     Rate and Rhythm: Normal rate and regular rhythm.     Heart sounds: Normal heart sounds.  Pulmonary:     Effort: Pulmonary effort is normal. No respiratory distress.     Breath sounds: No wheezing.  Abdominal:     General: Bowel sounds are normal.     Palpations: Abdomen is soft.  Musculoskeletal:     Cervical back: Neck supple.     Right lower leg: No tenderness. No edema.     Left lower leg: No tenderness. No edema.  Skin:    General: Skin is warm and dry.  Neurological:     Mental Status: She is  alert and oriented to person, place, and time.  Psychiatric:        Mood and Affect: Mood normal.     ED Results / Procedures / Treatments   Labs (all labs ordered are listed, but only abnormal results are displayed) Labs Reviewed  RESPIRATORY PANEL BY RT PCR (FLU A&B, COVID) - Abnormal; Notable for the following components:      Result Value   SARS Coronavirus 2 by RT PCR POSITIVE (*)    All other components within normal limits    EKG None  Radiology DG Chest Portable 1 View  Result Date: 05/28/2020 CLINICAL DATA:  Shortness of breath EXAM: PORTABLE CHEST 1 VIEW COMPARISON:  None. FINDINGS: The heart size and mediastinal contours are within normal limits. No focal airspace consolidation or pleural effusion. The visualized skeletal structures are unremarkable. IMPRESSION: No active disease. Electronically Signed   By: Jonna Clark M.D.   On: 05/28/2020 00:35    Procedures Procedures (including critical care time)  Medications Ordered in ED Medications - No data to display  ED Course  I have reviewed the triage vital signs and the nursing notes.  Pertinent labs & imaging results that were available during my care of the patient were reviewed by me and considered in my medical decision making (see chart for details).    MDM Rules/Calculators/A&P                          Patient presents with loss of sense of taste and smell.  She also reports some shortness of breath this evening.  She is overall nontoxic-appearing and vital signs are reassuring.  She is afebrile.  She is satting 96 to 97% on room air and is in no respiratory distress.  Her pulmonary exam is reassuring.  Given loss of sense of taste and smell, highly suspicious for COVID-19.  Swab was sent.  Chest x-ray obtained and shows no evidence of pneumothorax or pneumonia.  Doubt PE.  Suspect symptoms are related to COVID-19.  She likely meets criteria for monoclonal antibody therapy given history of asthma and BMI.   Will refer to monoclonal antibody clinic.  After history, exam, and medical workup I feel the patient has been appropriately medically screened and is safe for discharge home. Pertinent diagnoses were discussed with  the patient. Patient was given return precautions.  Paula Lucas was evaluated in Emergency Department on 05/28/2020 for the symptoms described in the history of present illness. She was evaluated in the context of the global COVID-19 pandemic, which necessitated consideration that the patient might be at risk for infection with the SARS-CoV-2 virus that causes COVID-19. Institutional protocols and algorithms that pertain to the evaluation of patients at risk for COVID-19 are in a state of rapid change based on information released by regulatory bodies including the CDC and federal and state organizations. These policies and algorithms were followed during the patient's care in the ED.   Final Clinical Impression(s) / ED Diagnoses Final diagnoses:  COVID-19    Rx / DC Orders ED Discharge Orders    None       Eastyn Skalla, Mayer Masker, MD 05/28/20 0236

## 2020-05-28 NOTE — ED Triage Notes (Signed)
Patient here home with c/o loss of smell and taste for 3 days, sob started today.

## 2020-05-28 NOTE — Telephone Encounter (Signed)
I connected by phone with Paula Lucas on 05/28/2020 at 4:10 PM to discuss the potential use of a new treatment for mild to moderate COVID-19 viral infection in non-hospitalized patients.  This patient is a 29 y.o. female that meets the FDA criteria for Emergency Use Authorization of COVID monoclonal antibody casirivimab/imdevimab or bamlanivimab/eteseviamb.  Has a (+) direct SARS-CoV-2 viral test result  Has mild or moderate COVID-19   Is NOT hospitalized due to COVID-19  Is within 10 days of symptom onset  Has at least one of the high risk factor(s) for progression to severe COVID-19 and/or hospitalization as defined in EUA.  Specific high risk criteria : BMI > 25   I have spoken and communicated the following to the patient or parent/caregiver regarding COVID monoclonal antibody treatment:  1. FDA has authorized the emergency use for the treatment of mild to moderate COVID-19 in adults and pediatric patients with positive results of direct SARS-CoV-2 viral testing who are 13 years of age and older weighing at least 40 kg, and who are at high risk for progressing to severe COVID-19 and/or hospitalization.  2. The significant known and potential risks and benefits of COVID monoclonal antibody, and the extent to which such potential risks and benefits are unknown.  3. Information on available alternative treatments and the risks and benefits of those alternatives, including clinical trials.  4. Patients treated with COVID monoclonal antibody should continue to self-isolate and use infection control measures (e.g., wear mask, isolate, social distance, avoid sharing personal items, clean and disinfect "high touch" surfaces, and frequent handwashing) according to CDC guidelines.   5. The patient or parent/caregiver has the option to accept or refuse COVID monoclonal antibody treatment.  After reviewing this information with the patient, the patient has agreed to receive one of the  available covid 19 monoclonal antibodies and will be provided an appropriate fact sheet prior to infusion. Gabriel Cirri, NP 05/28/2020 4:10 PM Sx onset 10/2

## 2020-05-29 ENCOUNTER — Ambulatory Visit (HOSPITAL_COMMUNITY): Payer: Medicaid Other

## 2020-05-29 ENCOUNTER — Encounter: Payer: Self-pay | Admitting: Physician Assistant

## 2020-05-30 ENCOUNTER — Ambulatory Visit (HOSPITAL_COMMUNITY): Payer: Medicaid Other | Attending: Pulmonary Disease

## 2020-06-18 ENCOUNTER — Encounter: Payer: Self-pay | Admitting: Family Medicine

## 2020-06-19 ENCOUNTER — Ambulatory Visit: Payer: Medicaid Other | Admitting: Family Medicine

## 2020-07-09 ENCOUNTER — Telehealth: Payer: Self-pay

## 2020-07-09 NOTE — Telephone Encounter (Signed)
Copied from CRM [id number]. Topic: General - Other >> Jul 09, 2020  8:59 AM Jaquita Rector A wrote: Reason for CRM: Patient called in to schedule an appointment for some vaginal discomfort and asking if it is possible to be seen before next week she would appreciate a call back please Ph# [phone]

## 2020-07-16 ENCOUNTER — Ambulatory Visit: Payer: Medicaid Other | Admitting: Family Medicine

## 2020-08-04 ENCOUNTER — Encounter (HOSPITAL_COMMUNITY): Payer: Self-pay

## 2020-08-04 ENCOUNTER — Emergency Department (HOSPITAL_COMMUNITY)
Admission: EM | Admit: 2020-08-04 | Discharge: 2020-08-04 | Discharge status: Against Medical Advice | Payer: Medicaid Other | Attending: Emergency Medicine | Admitting: Emergency Medicine

## 2020-08-04 ENCOUNTER — Other Ambulatory Visit: Payer: Self-pay

## 2020-08-04 DIAGNOSIS — N898 Other specified noninflammatory disorders of vagina: Secondary | ICD-10-CM | POA: Insufficient documentation

## 2020-08-04 DIAGNOSIS — F17210 Nicotine dependence, cigarettes, uncomplicated: Secondary | ICD-10-CM | POA: Diagnosis not present

## 2020-08-04 DIAGNOSIS — J45909 Unspecified asthma, uncomplicated: Secondary | ICD-10-CM | POA: Insufficient documentation

## 2020-08-04 DIAGNOSIS — L989 Disorder of the skin and subcutaneous tissue, unspecified: Secondary | ICD-10-CM | POA: Insufficient documentation

## 2020-08-04 LAB — WET PREP, GENITAL
Clue Cells Wet Prep HPF POC: POSITIVE — AB
Sperm: NONE SEEN
Trich, Wet Prep: NONE SEEN
Yeast Wet Prep HPF POC: NONE SEEN

## 2020-08-04 MED ORDER — HYDROXYZINE HCL 25 MG PO TABS: 25.0000 mg | ORAL_TABLET | Freq: Four times a day (QID) | ORAL | 0 refills | Status: DC

## 2020-08-04 MED ORDER — PERMETHRIN 5 % EX CREA: TOPICAL_CREAM | CUTANEOUS | 0 refills | Status: DC

## 2020-08-04 MED ORDER — DOXYCYCLINE HYCLATE 100 MG PO TABS
100.0000 mg | ORAL_TABLET | Freq: Once | ORAL | Status: AC
  Administered 2020-08-04: 22:00:00 100 mg via ORAL
  Filled 2020-08-04: qty 1

## 2020-08-04 MED ORDER — DOXYCYCLINE HYCLATE 100 MG PO CAPS: 100.0000 mg | ORAL_CAPSULE | Freq: Two times a day (BID) | ORAL | 0 refills | Status: DC

## 2020-08-04 MED ORDER — OXYCODONE HCL 5 MG PO TABS
5.0000 mg | ORAL_TABLET | Freq: Once | ORAL | Status: AC
  Administered 2020-08-04: 22:00:00 5 mg via ORAL
  Filled 2020-08-04: qty 1

## 2020-08-04 NOTE — ED Notes (Signed)
Pt refused bloodwork and urine and stated that she was leaving because she had to catch the bus. PA made aware.

## 2020-08-04 NOTE — ED Notes (Signed)
Pt left before signing AMA form

## 2020-08-04 NOTE — ED Triage Notes (Signed)
Pt reports a rash covering bilateral arms, stomach, back and inner thighs. Pt sts she was sleeping in a hotel room.

## 2020-08-04 NOTE — ED Provider Notes (Signed)
[facility]-EMERGENCY DEPT Provider Note   CSN: [id number] Arrival date & time: 08/04/20  1845     History Chief Complaint  Patient presents with  . Rash    Paula Lucas is a 29 y.o. female presents to ER for evaluation of skin lesions. Onset 1.5 weeks ago. Reports intensely itchy, burning circular lesions that first started in right forearm. These have since spread to left forearm, right lower leg, back, vaginal skin.  Reports one painful, swollen, draining lesion on the palm of her right hand. Also has a painful lesion in the left side of her vagina.  Has associated right sided neck pain and swelling.  Denies fever, chills. Denies recent illness. Has been living in a motel for the last 2-3 years. No recent or new hygiene or topical products. She is not concerned for STD, states last sexually active 2-3 months ago before he passed away 06-24-20. No interventions.   HPI     Past Medical History:  Diagnosis Date  . Asthma   . Asthma   . BMI 31.0-31.9,adult     Patient Active Problem List   Diagnosis Date Noted  . Tubo-ovarian abscess 06/19/2018  . Right tubo-ovarian abscess 06/19/2018  . Neisseria gonorrheae 03/06/2018 04/02/2018  . TOA (tubo-ovarian abscess) 04/01/2018  . BV (bacterial vaginosis) 04/01/2018  . Left ovarian cyst 04/16/2011    Past Surgical History:  Procedure Laterality Date  . INDUCED ABORTION    . IR RADIOLOGIST EVAL & MGMT  05/17/2018  . TONSILLECTOMY       OB History    Gravida  1   Para  0   Term  0   Preterm  0   AB  1   Living  0     SAB  0   IAB  1   Ectopic  0   Multiple  0   Live Births              Family History  Problem Relation Age of Onset  . Diabetes Mother   . Hypertension Mother   . Diabetes Father   . Hypertension Father   . Diabetes Sister   . Hypertension Sister     Social History   Tobacco Use  . Smoking status: Smoker, Current Status Unknown    Packs/day: 0.50     Years: 9.00    Pack years: 4.50    Types: Cigarettes  . Smokeless tobacco: Never Used  Substance Use Topics  . Alcohol use: No  . Drug use: Yes    Types: Marijuana    Comment: THC GUMMIES LAST MONTH    Home Medications Prior to Admission medications   Medication Sig Start Date End Date Taking? Authorizing Provider  doxycycline (VIBRAMYCIN) 100 MG capsule Take 1 capsule (100 mg total) by mouth 2 (two) times daily. 08/04/20   Liberty Handy, PA-C  hydrOXYzine (ATARAX/VISTARIL) 25 MG tablet Take 1 tablet (25 mg total) by mouth every 6 (six) hours. 08/04/20   Liberty Handy, PA-C  Lurasidone HCl (LATUDA) 60 MG TABS Take by mouth.    [provider]  penicillin v potassium (VEETID) 500 MG tablet Take 1 tablet (500 mg total) by mouth 3 (three) times daily. 05/24/19   Mardella Layman, MD  permethrin (ELIMITE) 5 % cream Apply to affected area once 08/04/20   Liberty Handy, PA-C  traMADol (ULTRAM) 50 MG tablet Take 1 tablet (50 mg total) by mouth every 6 (six) hours as  needed. 05/25/19   Eustace Moore, MD    Allergies    Fish allergy, Ibuprofen, Mushroom extract complex, Shellfish-derived products, and Tylenol [acetaminophen]  Review of Systems   Review of Systems  Skin:       Skin lesions Pruritus   All other systems reviewed and are negative.   Physical Exam Updated Vital Signs BP 123/84   Pulse (!) 108   Temp 98.2 F (36.8 C)   Resp 16   Ht 5\' 2"  (1.575 m)   Wt 61.2 kg   SpO2 99%   BMI 24.69 kg/m   Physical Exam Constitutional:      Appearance: She is well-developed.  HENT:     Head: Normocephalic.     Nose: Nose normal.  Eyes:     General: Lids are normal.  Neck:     Comments: Posterior cervical enlarged lymph nodes L greater than R. No supraclavicular or axillary LAD  Cardiovascular:     Rate and Rhythm: Normal rate.  Pulmonary:     Effort: Pulmonary effort is normal. No respiratory distress.  Abdominal:     Palpations: Abdomen is soft.      Tenderness: There is no abdominal tenderness.  Genitourinary:    Vagina: Vaginal discharge present.     Comments: Copious white discharge in vaginal opening. Several flat verrucous non tender lesions on clitoris, labia majora.  There is a larger exquisitely tender raised, white lesion also verrucous. No palpable abscess. Intravaginal/speculum exam deferred due to pain. Bilateral inguinal crease enlarged lymph nodes, non tender.  Musculoskeletal:        General: Normal range of motion.     Cervical back: Normal range of motion.  Skin:    Comments: Several scattered round lesions in right palm, forearm, left forearm, right lower leg, middle of back and abdomen. Two lesions on scalp. These are hyperpigmented with central clearing, some erythematous, dry.  Single raised, exquisitely tender lesion in left palm draining purulent drainage. No significant erythema around this lesion. No linear streaks. No vesicular lesions. No lesions on soles.   Neurological:     Mental Status: She is alert.  Psychiatric:        Behavior: Behavior normal.          ED Results / Procedures / Treatments   Labs (all labs ordered are listed, but only abnormal results are displayed) Labs Reviewed  WET PREP, GENITAL - Abnormal; Notable for the following components:      Result Value   Clue Cells Wet Prep HPF POC POSITIVE (*)    WBC, Wet Prep HPF POC MANY (*)    All other components within normal limits  AEROBIC CULTURE (SUPERFICIAL SPECIMEN)  RPR  URINALYSIS, ROUTINE W REFLEX MICROSCOPIC  HIV ANTIBODY (ROUTINE TESTING W REFLEX)  I-STAT BETA HCG BLOOD, ED ([facility], [facility], [facility] ONLY)  GC/CHLAMYDIA PROBE AMP ([place]) NOT AT [facility]    EKG None  Radiology No results found.  Procedures Procedures (including critical care time)  Medications Ordered in ED Medications  oxyCODONE (Oxy IR/ROXICODONE) immediate release tablet 5 mg (5 mg Oral Given 08/04/20 2211)  doxycycline (VIBRA-TABS) tablet 100 mg (100 mg  Oral Given 08/04/20 2211)    ED Course  I have reviewed the triage vital signs and the nursing notes.  Pertinent labs & imaging results that were available during my care of the patient were reviewed by me and considered in my medical decision making (see chart for details).    MDM Rules/Calculators/A&P  29 year old female presents to the ED for intensely itchy, burning skin lesions.  Has 2 exquisitely tender lesions in the right palm and left vulva.  On exam she is afebrile.  Has tender lymphadenopathy.  No palpable abscess on exam to warrant incision and drainage.  Wound culture obtained of the right palm lesion.  Patient discussed with EDP.  Recommended obtaining HIV, RPR as well as wet prep and GC/chlamydia probe.  Etiology of symptoms unclear at this time but considered scabies, superimposed infection, syphilis.  Vaginal lesions are mostly nontender except for the one single lesion in the left vulva.  Patient given doxycycline here and oxycodone for pain.  Patient left AMA and declined lab work.  Prescriptions sent to her pharmacy before she left.  Recommended dermatology follow-up.  Return precautions discussed with patient prior to her leaving.  Final Clinical Impression(s) / ED Diagnoses Final diagnoses:  Generalized skin lesions    Rx / DC Orders ED Discharge Orders         Ordered    hydrOXYzine (ATARAX/VISTARIL) 25 MG tablet  Every 6 hours        08/04/20 2322    permethrin (ELIMITE) 5 % cream        08/04/20 2322    doxycycline (VIBRAMYCIN) 100 MG capsule  2 times daily        08/04/20 2322           Jerrell Mylar 08/04/20 2324    Pricilla Loveless, MD 08/05/20 0028

## 2020-08-05 LAB — GC/CHLAMYDIA PROBE AMP (~~LOC~~) NOT AT ARMC
Chlamydia: POSITIVE — AB
Comment: NEGATIVE
Comment: NORMAL
Neisseria Gonorrhea: NEGATIVE

## 2020-08-08 LAB — AEROBIC CULTURE W GRAM STAIN (SUPERFICIAL SPECIMEN): Gram Stain: NONE SEEN

## 2020-08-09 ENCOUNTER — Telehealth (HOSPITAL_BASED_OUTPATIENT_CLINIC_OR_DEPARTMENT_OTHER): Payer: Self-pay | Admitting: Emergency Medicine

## 2020-08-09 NOTE — Telephone Encounter (Signed)
Post ED Visit - Positive Culture Follow-up  Culture report reviewed by antimicrobial stewardship pharmacist: [facility] []  , Pharm.D. []  Enzo Bi, Pharm.D., BCPS AQ-ID []  , Pharm.D., BCPS []  Celedonio Miyamoto, .D., BCPS []  [place], .D., BCPS, AAHIVP []  Georgina Pillion, Pharm.D., BCPS, AAHIVP []  [address], PharmD, BCPS []  , PharmD, BCPS []  [place], PharmD, BCPS []  [place], PharmD []  , PharmD, BCPS []  Estella Husk, PharmD  Pharmacy Team []  Lysle Pearl, PharmD []  , PharmD []  Phillips Climes, PharmD []  , Rph []  Agapito Games) , PharmD []  Verlan Friends, PharmD []  , PharmD []  Mervyn Gay, PharmD []  , PharmD []  Vinnie Level, PharmD [x]  [facility], PharmD []  , PharmD []  Len Childs, PharmD   Positive aerobic culture Treated with Doxycycline, organism sensitive to the same and no further patient follow-up is required at this time.  08/09/2020, 5:37 PM

## 2020-10-22 ENCOUNTER — Telehealth: Payer: Self-pay

## 2020-10-22 NOTE — Telephone Encounter (Signed)
Patient called and asked to re-establish care with one of our providers. I scheduled her with Dr. Alvis Lemmings, however, patient stated she just needed to check how far along she was. I clarified with patient that she meant weeks of pregnancy and advised her we do not see pregnant patients.   Can patient get a referral to OBGYN?

## 2020-10-22 NOTE — Telephone Encounter (Signed)
Refer patient to [facility]

## 2020-10-27 NOTE — Telephone Encounter (Signed)
Could you please reach out to patient and schedule a new OB visit with Dr. Earlene Plater?

## 2020-10-27 NOTE — Telephone Encounter (Signed)
Tried to call pt 2x and a " Call rejected" message happens and could not leave a voice message.

## 2020-12-01 ENCOUNTER — Ambulatory Visit: Payer: Medicaid Other | Admitting: Family Medicine

## 2021-08-09 ENCOUNTER — Emergency Department (HOSPITAL_COMMUNITY): Payer: Medicaid Other

## 2021-08-09 ENCOUNTER — Encounter (HOSPITAL_COMMUNITY): Payer: Self-pay | Admitting: Emergency Medicine

## 2021-08-09 ENCOUNTER — Other Ambulatory Visit: Payer: Self-pay

## 2021-08-09 ENCOUNTER — Emergency Department (HOSPITAL_COMMUNITY)
Admission: EM | Admit: 2021-08-09 | Discharge: 2021-08-10 | Disposition: A | Discharge status: Home / Self Care | Payer: Medicaid Other | Attending: Emergency Medicine | Admitting: Emergency Medicine

## 2021-08-09 DIAGNOSIS — R079 Chest pain, unspecified: Secondary | ICD-10-CM

## 2021-08-09 DIAGNOSIS — B349 Viral infection, unspecified: Secondary | ICD-10-CM | POA: Insufficient documentation

## 2021-08-09 DIAGNOSIS — R109 Unspecified abdominal pain: Secondary | ICD-10-CM | POA: Insufficient documentation

## 2021-08-09 DIAGNOSIS — R072 Precordial pain: Secondary | ICD-10-CM | POA: Diagnosis not present

## 2021-08-09 DIAGNOSIS — Z20822 Contact with and (suspected) exposure to covid-19: Secondary | ICD-10-CM | POA: Insufficient documentation

## 2021-08-09 DIAGNOSIS — F17210 Nicotine dependence, cigarettes, uncomplicated: Secondary | ICD-10-CM | POA: Diagnosis not present

## 2021-08-09 DIAGNOSIS — M5432 Sciatica, left side: Secondary | ICD-10-CM | POA: Insufficient documentation

## 2021-08-09 DIAGNOSIS — J45909 Unspecified asthma, uncomplicated: Secondary | ICD-10-CM | POA: Insufficient documentation

## 2021-08-09 DIAGNOSIS — R799 Abnormal finding of blood chemistry, unspecified: Secondary | ICD-10-CM | POA: Diagnosis not present

## 2021-08-09 DIAGNOSIS — R600 Localized edema: Secondary | ICD-10-CM | POA: Diagnosis not present

## 2021-08-09 DIAGNOSIS — R059 Cough, unspecified: Secondary | ICD-10-CM | POA: Diagnosis present

## 2021-08-09 DIAGNOSIS — N9489 Other specified conditions associated with female genital organs and menstrual cycle: Secondary | ICD-10-CM | POA: Diagnosis not present

## 2021-08-09 DIAGNOSIS — R609 Edema, unspecified: Secondary | ICD-10-CM

## 2021-08-09 LAB — COMPREHENSIVE METABOLIC PANEL
ALT: 15 U/L (ref 0–44)
AST: 28 U/L (ref 15–41)
Albumin: 4.2 g/dL (ref 3.5–5.0)
Alkaline Phosphatase: 49 U/L (ref 38–126)
Anion gap: 10 (ref 5–15)
BUN: 14 mg/dL (ref 6–20)
CO2: 20 mmol/L — ABNORMAL LOW (ref 22–32)
Calcium: 9.2 mg/dL (ref 8.9–10.3)
Chloride: 104 mmol/L (ref 98–111)
Creatinine, Ser: 1.01 mg/dL — ABNORMAL HIGH (ref 0.44–1.00)
GFR, Estimated: >60 mL/min (ref >60)
Glucose, Bld: 78 mg/dL (ref 70–99)
Potassium: 3.4 mmol/L — ABNORMAL LOW (ref 3.5–5.1)
Sodium: 134 mmol/L — ABNORMAL LOW (ref 135–145)
Total Bilirubin: 0.9 mg/dL (ref 0.3–1.2)
Total Protein: 7.5 g/dL (ref 6.5–8.1)

## 2021-08-09 LAB — CBC
HCT: 39.9 % (ref 36.0–46.0)
Hemoglobin: 13.1 g/dL (ref 12.0–15.0)
MCH: 30 pg (ref 26.0–34.0)
MCHC: 32.8 g/dL (ref 30.0–36.0)
MCV: 91.3 fL (ref 80.0–100.0)
Platelets: 345 10*3/uL (ref 150–400)
RBC: 4.37 MIL/uL (ref 3.87–5.11)
RDW: 14.5 % (ref 11.5–15.5)
WBC: 7.9 10*3/uL (ref 4.0–10.5)
nRBC: 0 % (ref 0.0–0.2)

## 2021-08-09 LAB — RESP PANEL BY RT-PCR (FLU A&B, COVID) ARPGX2
Influenza A by PCR: NEGATIVE
Influenza B by PCR: NEGATIVE
SARS Coronavirus 2 by RT PCR: NEGATIVE

## 2021-08-09 LAB — LIPASE, BLOOD: Lipase: 28 U/L (ref 11–51)

## 2021-08-09 LAB — TROPONIN I (HIGH SENSITIVITY): Troponin I (High Sensitivity): 5 ng/L (ref <18)

## 2021-08-09 NOTE — ED Triage Notes (Addendum)
Pt to triage via [facility] from home.  Reports chest pain since last night that started after coughing.  Also reports bilateral leg pain/swelling and abd cramping.    Pt reports chest pain started to center of chest at 4am with SOB.

## 2021-08-09 NOTE — ED Provider Notes (Signed)
Emergency Medicine Provider Triage Evaluation Note  Paula Lucas , a 30 y.o. female  was evaluated in triage.  Pt complains of chest pain  Review of Systems  Positive: Chest pain Negative: No fever  Physical Exam  BP 130/85 (BP Location: Left Arm)    Pulse (!) 115    Temp 99.4 F (37.4 C)    Resp 17    LMP 08/09/2021    SpO2 100%  Gen:   Awake, no distress   Resp:  Normal effort  MSK:   Moves extremities without difficulty  Other:    Medical Decision Making  Medically screening exam initiated at 5:28 PM.  Appropriate orders placed.  Paula Lucas was informed that the remainder of the evaluation will be completed by another provider, this initial triage assessment does not replace that evaluation, and the importance of remaining in the ED until their evaluation is complete.     Elson Areas, [place] 08/09/21 1729    Vanetta Mulders, MD 08/12/21 1331

## 2021-08-10 ENCOUNTER — Emergency Department (HOSPITAL_BASED_OUTPATIENT_CLINIC_OR_DEPARTMENT_OTHER): Payer: Medicaid Other

## 2021-08-10 DIAGNOSIS — M79605 Pain in left leg: Secondary | ICD-10-CM

## 2021-08-10 DIAGNOSIS — M79604 Pain in right leg: Secondary | ICD-10-CM

## 2021-08-10 LAB — URINALYSIS, MICROSCOPIC (REFLEX): RBC / HPF: >50 RBC/hpf (ref 0–5)

## 2021-08-10 LAB — CBG MONITORING, ED: Glucose-Capillary: 83 mg/dL (ref 70–99)

## 2021-08-10 LAB — URINALYSIS, ROUTINE W REFLEX MICROSCOPIC
Bilirubin Urine: NEGATIVE
Glucose, UA: NEGATIVE mg/dL
Ketones, ur: 40 mg/dL — AB
Leukocytes,Ua: NEGATIVE
Nitrite: NEGATIVE
Protein, ur: NEGATIVE mg/dL
Specific Gravity, Urine: >1.03 — ABNORMAL HIGH (ref 1.005–1.030)
pH: 5.5 (ref 5.0–8.0)

## 2021-08-10 LAB — D-DIMER, QUANTITATIVE: D-Dimer, Quant: 0.31 ug/mL-FEU (ref 0.00–0.50)

## 2021-08-10 LAB — TROPONIN I (HIGH SENSITIVITY): Troponin I (High Sensitivity): 4 ng/L (ref <18)

## 2021-08-10 LAB — POC URINE PREG, ED: Preg Test, Ur: NEGATIVE

## 2021-08-10 MED ORDER — SODIUM CHLORIDE 0.9 % IV BOLUS
1000.0000 mL | Freq: Once | INTRAVENOUS | Status: AC
  Administered 2021-08-10: 07:00:00 1000 mL via INTRAVENOUS

## 2021-08-10 MED ORDER — METHYLPREDNISOLONE 4 MG PO TBPK: ORAL_TABLET | ORAL | 0 refills | Status: DC

## 2021-08-10 NOTE — ED Provider Notes (Signed)
[facility] EMERGENCY DEPARTMENT Provider Note   CSN: [id number] Arrival date & time: 08/09/21  1507     History Chief Complaint  Patient presents with   Chest Pain    Paula Lucas is a 30 y.o. female who presents to the ED today with complaint of gradual onset, constant, sharp, substernal chest pain that began yesterday.  Patient reports that she has been having a dry cough for approximately 1 week.  She denies any other symptoms with same.  She states that her chest pain began after a coughing spell yesterday and has been present since then.  She states that the chest pain is pleuritic in nature at this time.  She also complains of bilateral lower extremity swelling and a tingling sensation to her left lower extremity as well as pain to both. She states that her lower back is hurting diffusely across the lower portion and she is also having abdominal pain.  She does admit that she has a history of ovarian cysts and sometimes will have pain with same and her abdominal pain feels similar.  He denies any tingling or swelling to her arms.  She denies a ripping/tearing sensation.  She is a former vapor.  She does admit that she was recently released from jail about 2 weeks ago and attempted to start vaping however started coughing and so she stopped.  She denies cigarette use.  She denies any history of DVT or PE.  She denies any recent prolonged travel or immobilization.  No hemoptysis.  No active malignancy.  No exogenous hormone use.   The history is provided by the patient and medical records.      Past Medical History:  Diagnosis Date   Asthma    Asthma    BMI 31.0-31.9,adult     Patient Active Problem List   Diagnosis Date Noted   Tubo-ovarian abscess 06/19/2018   Right tubo-ovarian abscess 06/19/2018   Neisseria gonorrheae 03/06/2018 04/02/2018   TOA (tubo-ovarian abscess) 04/01/2018   BV (bacterial vaginosis) 04/01/2018   Left ovarian cyst 04/16/2011     Past Surgical History:  Procedure Laterality Date   INDUCED ABORTION     IR RADIOLOGIST EVAL & MGMT  05/17/2018   TONSILLECTOMY       OB History     Gravida  1   Para  0   Term  0   Preterm  0   AB  1   Living  0      SAB  0   IAB  1   Ectopic  0   Multiple  0   Live Births              Family History  Problem Relation Age of Onset   Diabetes Mother    Hypertension Mother    Diabetes Father    Hypertension Father    Diabetes Sister    Hypertension Sister     Social History   Tobacco Use   Smoking status: Smoker, Current Status Unknown    Packs/day: 0.50    Years: 9.00    Pack years: 4.50    Types: Cigarettes   Smokeless tobacco: Never  Substance Use Topics   Alcohol use: No   Drug use: Yes    Types: Marijuana    Comment: THC GUMMIES LAST MONTH    Home Medications Prior to Admission medications   Medication Sig Start Date End Date Taking? Authorizing Provider  methylPREDNISolone (MEDROL DOSEPAK) 4 MG TBPK  tablet Follow package insert 08/10/21  Yes Alexis Mizuno, PA-C  doxycycline (VIBRAMYCIN) 100 MG capsule Take 1 capsule (100 mg total) by mouth 2 (two) times daily. 08/04/20   Liberty Handy, PA-C  hydrOXYzine (ATARAX/VISTARIL) 25 MG tablet Take 1 tablet (25 mg total) by mouth every 6 (six) hours. 08/04/20   Liberty Handy, PA-C  Lurasidone HCl (LATUDA) 60 MG TABS Take by mouth.    [provider]  penicillin v potassium (VEETID) 500 MG tablet Take 1 tablet (500 mg total) by mouth 3 (three) times daily. 05/24/19   Mardella Layman, MD  permethrin (ELIMITE) 5 % cream Apply to affected area once 08/04/20   Liberty Handy, PA-C  traMADol (ULTRAM) 50 MG tablet Take 1 tablet (50 mg total) by mouth every 6 (six) hours as needed. 05/25/19   Eustace Moore, MD    Allergies    Fish allergy, Ibuprofen, Mushroom extract complex, Shellfish-derived products, and Tylenol [acetaminophen]  Review of Systems   Review of Systems   Constitutional:  Negative for chills and fever.  Respiratory:  Positive for cough and shortness of breath.   Cardiovascular:  Positive for chest pain and leg swelling. Negative for palpitations.  Gastrointestinal:  Negative for nausea and vomiting.  Musculoskeletal:  Positive for back pain.  Neurological:  Negative for weakness and numbness.       + paresthesias to LLE  All other systems reviewed and are negative.  Physical Exam Updated Vital Signs BP 91/60    Pulse 77    Temp 98.2 F (36.8 C) (Oral)    Resp 18    LMP 08/09/2021    SpO2 98%   Physical Exam Vitals and nursing note reviewed.  Constitutional:      Appearance: She is not ill-appearing or diaphoretic.  HENT:     Head: Normocephalic and atraumatic.  Eyes:     Conjunctiva/sclera: Conjunctivae normal.  Cardiovascular:     Rate and Rhythm: Normal rate and regular rhythm.     Pulses:          Radial pulses are 2+ on the right side and 2+ on the left side.       Dorsalis pedis pulses are 2+ on the right side and 2+ on the left side.     Heart sounds: Normal heart sounds.  Pulmonary:     Effort: Pulmonary effort is normal.     Breath sounds: Normal breath sounds. No decreased breath sounds, wheezing, rhonchi or rales.  Abdominal:     Palpations: Abdomen is soft.     Tenderness: There is no abdominal tenderness. There is no guarding or rebound.  Musculoskeletal:     Cervical back: Neck supple.     Right lower leg: Edema present.     Left lower leg: Edema present.     Comments: Nonpitting edema bilaterally. No erythema. Mild TTP to bilateral legs. Decreased sensation noted to LLE; able to discern dull and sharp sensation however. Strength 5/5 bilaterally and able to lift left leg off of stretcher and hold against gravity without issue. ROM intact to LLE. 2+ DP pulses bilaterally.   Skin:    General: Skin is warm and dry.  Neurological:     Mental Status: She is alert.    ED Results / Procedures / Treatments    Labs (all labs ordered are listed, but only abnormal results are displayed) Labs Reviewed  COMPREHENSIVE METABOLIC PANEL - Abnormal; Notable for the following components:  Result Value   Sodium 134 (*)    Potassium 3.4 (*)    CO2 20 (*)    Creatinine, Ser 1.01 (*)    All other components within normal limits  RESP PANEL BY RT-PCR (FLU A&B, COVID) ARPGX2  LIPASE, BLOOD  CBC  D-DIMER, QUANTITATIVE  URINALYSIS, ROUTINE W REFLEX MICROSCOPIC  I-STAT BETA HCG BLOOD, ED ([facility], [facility], [facility] ONLY)  CBG MONITORING, ED  POC URINE PREG, ED  TROPONIN I (HIGH SENSITIVITY)  TROPONIN I (HIGH SENSITIVITY)    EKG EKG Interpretation  Date/Time:  Sunday August 09 2021 17:06:36 EST Ventricular Rate:  106 PR Interval:  144 QRS Duration: 76 QT Interval:  340 QTC Calculation: 451 R Axis:   46 Text Interpretation: Sinus tachycardia Cannot rule out Anterior infarct , age undetermined Abnormal ECG baseline wander, multiple leads Confirmed by Tilden Fossa [phone]) on 08/09/2021 11:05:17 PM  Radiology DG Chest 2 View  Result Date: 08/09/2021 CLINICAL DATA:  Chest pain and coughing. EXAM: CHEST - 2 VIEW COMPARISON:  Chest x-ray 05/28/2020 FINDINGS: The heart size and mediastinal contours are within normal limits. Both lungs are clear. The visualized skeletal structures are unremarkable. IMPRESSION: No active cardiopulmonary disease. Electronically Signed   By: Darliss Cheney M.D.   On: 08/09/2021 18:00   VAS [place] LOWER EXTREMITY VENOUS (DVT) (7a-7p)  Result Date: 08/10/2021  Lower Venous DVT Study Patient Name:  GABRIELLA WOODHEAD Calandro  Date of Exam:   08/10/2021 Medical Rec #: [id number]         Accession #:    [id number] Date of Birth: Aug 03, 1991         Patient Gender: F Patient Age:   64 years Exam Location:  [facility] Procedure:      VAS [place] LOWER EXTREMITY VENOUS (DVT) Referring Phys: Tawni Melkonian --------------------------------------------------------------------------------  Indications: Pain.   Comparison Study: no prior Performing Technologist: [place] Ponder RVS  Examination Guidelines: A complete evaluation includes B-mode imaging, spectral Doppler, color Doppler, and power Doppler as needed of all accessible portions of each vessel. Bilateral testing is considered an integral part of a complete examination. Limited examinations for reoccurring indications may be performed as noted. The reflux portion of the exam is performed with the patient in reverse Trendelenburg.  +---------+---------------+---------+-----------+----------+--------------+  RIGHT     Compressibility Phasicity Spontaneity Properties Thrombus Aging  +---------+---------------+---------+-----------+----------+--------------+  CFV       Full            Yes       Yes                                    +---------+---------------+---------+-----------+----------+--------------+  SFJ       Full                                                             +---------+---------------+---------+-----------+----------+--------------+  FV Prox   Full                                                             +---------+---------------+---------+-----------+----------+--------------+  FV Mid    Full                                                             +---------+---------------+---------+-----------+----------+--------------+  FV Distal Full                                                             +---------+---------------+---------+-----------+----------+--------------+  PFV       Full                                                             +---------+---------------+---------+-----------+----------+--------------+  POP       Full            Yes       Yes                                    +---------+---------------+---------+-----------+----------+--------------+  PTV       Full                                                             +---------+---------------+---------+-----------+----------+--------------+  PERO      Full                                                              +---------+---------------+---------+-----------+----------+--------------+   +---------+---------------+---------+-----------+----------+--------------+  LEFT      Compressibility Phasicity Spontaneity Properties Thrombus Aging  +---------+---------------+---------+-----------+----------+--------------+  CFV       Full            Yes       Yes                                    +---------+---------------+---------+-----------+----------+--------------+  SFJ       Full                                                             +---------+---------------+---------+-----------+----------+--------------+  FV Prox   Full                                                             +---------+---------------+---------+-----------+----------+--------------+  FV Mid    Full                                                             +---------+---------------+---------+-----------+----------+--------------+  FV Distal Full                                                             +---------+---------------+---------+-----------+----------+--------------+  PFV       Full                                                             +---------+---------------+---------+-----------+----------+--------------+  POP       Full            Yes       Yes                                    +---------+---------------+---------+-----------+----------+--------------+  PTV       Full                                                             +---------+---------------+---------+-----------+----------+--------------+  PERO      Full                                                             +---------+---------------+---------+-----------+----------+--------------+     Summary: BILATERAL: - No evidence of deep vein thrombosis seen in the lower extremities, bilaterally. -No evidence of popliteal cyst, bilaterally.   *See table(s) above for measurements and observations.     Preliminary     Procedures Procedures   Medications Ordered in ED Medications  sodium chloride 0.9 % bolus 1,000 mL (1,000 mLs Intravenous New Bag/Given 08/10/21 0707)    ED Course  I have reviewed the triage vital signs and the nursing notes.  Pertinent labs & imaging results that were available during my care of the patient were reviewed by me and considered in my medical decision making (see chart for details).    MDM Rules/Calculators/A&P                          30 year old female who presents to the ED today with multiple complaints.  Mostly concerned regarding the chest pain that she has been having since yesterday with associated shortness of breath as well as bilateral lower extremity swelling/pain.  She does also mention a tingling sensation to her left lower extremity that  started yesterday as well as bilateral lower back pain.  No history of sciatica.  On arrival to the ED his temperature is slightly elevated at 99.4.  She is tachycardic in the 115's.  Her blood pressure stable at 130/85.  Respirations of 17.  Satting 100% on room air.  She was medically screened and work-up started including abdominal labs that she was also complaining of abdominal pain.  History of ovarian cysts states this pain feels similar.  No vaginal discharge or pelvic pain.  CBC has returned without leukocytosis and hemoglobin stable at 13.1.  CMP with a sodium 134, potassium 3.4, bicarb of 20, creatinine of 1.01.  No other electrolyte abnormalities.  LFTs unremarkable.  Lipase within normal limits at 28.  Initial troponin of 5.  COVID and flu testing are negative.  X-ray clear.  EKG with sinus tachycardia.   When patient is brought back to the room and she continues to be tachycardic however appears to be no acute distress. Blood pressure slightly low; fluids provided at this time. On my exam she is noted to have mild nonpitting edema bilaterally. Given leg swelling appears new will plan for DVT Study - no  vascular congestion seen on xray to suggest CHF. She is noted to have some decreased subjective sensation to LLE however able to discern dull and sharp sensation. She has equal strength throughout all extremities and no focal deficits. She is noted to have bilateral lumbar paraspinal musculature TTP without midline TTP. Abd soft and nontender. Equal pulses bilaterally.   Given cough I do suspect symptoms are all viral in nature however with complaint of chest pain and tachycardia will add on dimer at this time. Pt will also need repeat troponin testing. If Dimer elevated will proceed with CTA for PE rule out. I have very low suspicion for dissection - pt is resting comfortably at bedside and snoring during repeat examinations. Question some sciatica given back pain and tingling sensation in LLE. If workup reassuring will plan to discharge home with PCP follow up. May consider prednisone for radiculopathy.   DVT study negative  Dimer negative at 0.31 Repeat troponin of 4 POC urine preg negative  Pressures continue to  be somewhat soft in the 90s/60s however pt has been in the ED for over 20 hours without much food; suspect this is likely cause of hypotension. She has been able to ambulate successfully to the bathroom without assistance and is mentating appropriately. Will discharge home at this time with PCP follow up.   Vitals:   08/09/21 1535 08/09/21 2008 08/10/21 0228 08/10/21 0545  BP: 130/85 119/77 109/71 93/64   08/10/21 0652 08/10/21 0705 08/10/21 0730 08/10/21 0754  BP: (!) 88/54 (!) 92/50 (!) 97/57 112/79   08/10/21 0830 08/10/21 0900 08/10/21 1015  BP: 101/73 (!) 98/57 91/60   This note was prepared using Dragon voice recognition software and may include unintentional dictation errors due to the inherent limitations of voice recognition software.      Final Clinical Impression(s) / ED Diagnoses Final diagnoses:  Nonspecific chest pain  Peripheral edema  Sciatica of left side   Viral illness    Rx / DC Orders ED Discharge Orders          Ordered    methylPREDNISolone (MEDROL DOSEPAK) 4 MG TBPK tablet        08/10/21 1121             Discharge Instructions      Your workup was overall  reassuring in the ED today. Please pick up  medication and take as prescribed as your leg tingling may be related to inflammation around a nerve in your back/leg.   Drink plenty of fluids to stay hydrated and rest as much as possible. Take OTC cold and cough medication. You can also take Ibuprofen and Tylenol as needed for your body pain.   Follow up with your PCP for further evaluation. If you do not have a PCP you can follow up with [facility] for primary care needs.   Return to the ED for any new/worsening symptoms        Tanda Rockers, PA-C 08/10/21 1125    Milagros Loll, MD 08/10/21 [phone]

## 2021-08-10 NOTE — Discharge Instructions (Addendum)
Your workup was overall reassuring in the ED today. Please pick up  medication and take as prescribed as your leg tingling may be related to inflammation around a nerve in your back/leg.   Drink plenty of fluids to stay hydrated and rest as much as possible. Take OTC cold and cough medication. You can also take Ibuprofen and Tylenol as needed for your body pain.   Follow up with your PCP for further evaluation. If you do not have a PCP you can follow up with [facility] for primary care needs.   Return to the ED for any new/worsening symptoms

## 2021-08-10 NOTE — Progress Notes (Signed)
Lower extremity venous has been completed.   Preliminary results in CV Proc.   Paula Lucas Breda Bond 08/10/2021 8:40 AM

## 2022-02-03 ENCOUNTER — Emergency Department (HOSPITAL_COMMUNITY)
Admission: EM | Admit: 2022-02-03 | Discharge: 2022-02-03 | Discharge status: Against Medical Advice | Payer: Self-pay | Attending: Emergency Medicine | Admitting: Emergency Medicine

## 2022-02-03 DIAGNOSIS — Z5321 Procedure and treatment not carried out due to patient leaving prior to being seen by health care provider: Secondary | ICD-10-CM | POA: Insufficient documentation

## 2022-02-03 DIAGNOSIS — Z3A15 15 weeks gestation of pregnancy: Secondary | ICD-10-CM | POA: Insufficient documentation

## 2022-02-03 DIAGNOSIS — R1030 Lower abdominal pain, unspecified: Secondary | ICD-10-CM | POA: Insufficient documentation

## 2022-02-03 DIAGNOSIS — O26892 Other specified pregnancy related conditions, second trimester: Secondary | ICD-10-CM | POA: Insufficient documentation

## 2022-02-03 NOTE — ED Triage Notes (Addendum)
Pt brought in by EMS with reports of lower abdominal pain and states she is [redacted] weeks pregnant.   During triage the patient reports she is leaving because she wants to lay down. Pt provided a triage traige and this RN laid it back for her to lay down but she states she stll wants to leave.

## 2022-02-03 NOTE — ED Notes (Addendum)
Pt A&OX4 and continues to state she wants to leave.Risks of leaving AMA explained to the patient who verbalized understanding.

## 2022-02-04 ENCOUNTER — Inpatient Hospital Stay (HOSPITAL_COMMUNITY): Payer: Medicaid Other

## 2022-02-04 ENCOUNTER — Emergency Department (HOSPITAL_BASED_OUTPATIENT_CLINIC_OR_DEPARTMENT_OTHER): Payer: Medicaid Other

## 2022-02-04 ENCOUNTER — Encounter (HOSPITAL_BASED_OUTPATIENT_CLINIC_OR_DEPARTMENT_OTHER): Payer: Self-pay

## 2022-02-04 ENCOUNTER — Inpatient Hospital Stay (HOSPITAL_BASED_OUTPATIENT_CLINIC_OR_DEPARTMENT_OTHER)
Admission: EM | Admit: 2022-02-04 | Discharge: 2022-02-07 | DRG: 871 | Discharge status: Against Medical Advice | Payer: Medicaid Other | Attending: Pulmonary Disease | Admitting: Pulmonary Disease

## 2022-02-04 DIAGNOSIS — E871 Hypo-osmolality and hyponatremia: Secondary | ICD-10-CM | POA: Diagnosis present

## 2022-02-04 DIAGNOSIS — A549 Gonococcal infection, unspecified: Secondary | ICD-10-CM | POA: Diagnosis present

## 2022-02-04 DIAGNOSIS — J45909 Unspecified asthma, uncomplicated: Secondary | ICD-10-CM | POA: Diagnosis present

## 2022-02-04 DIAGNOSIS — Z20822 Contact with and (suspected) exposure to covid-19: Secondary | ICD-10-CM | POA: Diagnosis present

## 2022-02-04 DIAGNOSIS — Z8719 Personal history of other diseases of the digestive system: Secondary | ICD-10-CM | POA: Diagnosis not present

## 2022-02-04 DIAGNOSIS — N179 Acute kidney failure, unspecified: Secondary | ICD-10-CM | POA: Diagnosis present

## 2022-02-04 DIAGNOSIS — Z91148 Patient's other noncompliance with medication regimen for other reason: Secondary | ICD-10-CM

## 2022-02-04 DIAGNOSIS — Z91010 Allergy to peanuts: Secondary | ICD-10-CM

## 2022-02-04 DIAGNOSIS — Z79899 Other long term (current) drug therapy: Secondary | ICD-10-CM | POA: Diagnosis not present

## 2022-02-04 DIAGNOSIS — Z91013 Allergy to seafood: Secondary | ICD-10-CM

## 2022-02-04 DIAGNOSIS — L989 Disorder of the skin and subcutaneous tissue, unspecified: Secondary | ICD-10-CM | POA: Diagnosis present

## 2022-02-04 DIAGNOSIS — D75838 Other thrombocytosis: Secondary | ICD-10-CM | POA: Diagnosis present

## 2022-02-04 DIAGNOSIS — N760 Acute vaginitis: Secondary | ICD-10-CM | POA: Diagnosis present

## 2022-02-04 DIAGNOSIS — N739 Female pelvic inflammatory disease, unspecified: Secondary | ICD-10-CM | POA: Diagnosis present

## 2022-02-04 DIAGNOSIS — M549 Dorsalgia, unspecified: Secondary | ICD-10-CM | POA: Diagnosis not present

## 2022-02-04 DIAGNOSIS — E872 Acidosis, unspecified: Secondary | ICD-10-CM | POA: Diagnosis present

## 2022-02-04 DIAGNOSIS — I10 Essential (primary) hypertension: Secondary | ICD-10-CM | POA: Diagnosis present

## 2022-02-04 DIAGNOSIS — R1032 Left lower quadrant pain: Secondary | ICD-10-CM | POA: Diagnosis not present

## 2022-02-04 DIAGNOSIS — N7093 Salpingitis and oophoritis, unspecified: Secondary | ICD-10-CM | POA: Diagnosis present

## 2022-02-04 DIAGNOSIS — Z9102 Food additives allergy status: Secondary | ICD-10-CM

## 2022-02-04 DIAGNOSIS — K7200 Acute and subacute hepatic failure without coma: Secondary | ICD-10-CM | POA: Diagnosis present

## 2022-02-04 DIAGNOSIS — B9562 Methicillin resistant Staphylococcus aureus infection as the cause of diseases classified elsewhere: Secondary | ICD-10-CM | POA: Diagnosis not present

## 2022-02-04 DIAGNOSIS — K750 Abscess of liver: Secondary | ICD-10-CM | POA: Diagnosis not present

## 2022-02-04 DIAGNOSIS — F17210 Nicotine dependence, cigarettes, uncomplicated: Secondary | ICD-10-CM | POA: Diagnosis present

## 2022-02-04 DIAGNOSIS — R200 Anesthesia of skin: Secondary | ICD-10-CM | POA: Diagnosis present

## 2022-02-04 DIAGNOSIS — R6521 Severe sepsis with septic shock: Secondary | ICD-10-CM | POA: Diagnosis present

## 2022-02-04 DIAGNOSIS — K651 Peritoneal abscess: Secondary | ICD-10-CM | POA: Diagnosis not present

## 2022-02-04 DIAGNOSIS — A419 Sepsis, unspecified organism: Principal | ICD-10-CM | POA: Diagnosis present

## 2022-02-04 DIAGNOSIS — Z22322 Carrier or suspected carrier of Methicillin resistant Staphylococcus aureus: Secondary | ICD-10-CM | POA: Diagnosis not present

## 2022-02-04 DIAGNOSIS — Z886 Allergy status to analgesic agent status: Secondary | ICD-10-CM | POA: Diagnosis not present

## 2022-02-04 DIAGNOSIS — R739 Hyperglycemia, unspecified: Secondary | ICD-10-CM | POA: Diagnosis present

## 2022-02-04 DIAGNOSIS — N719 Inflammatory disease of uterus, unspecified: Secondary | ICD-10-CM | POA: Diagnosis not present

## 2022-02-04 DIAGNOSIS — R8271 Bacteriuria: Secondary | ICD-10-CM | POA: Diagnosis not present

## 2022-02-04 HISTORY — DX: Chlamydial infection, unspecified: A74.9

## 2022-02-04 HISTORY — DX: Gonococcal infection, unspecified: A54.9

## 2022-02-04 HISTORY — DX: Salpingitis and oophoritis, unspecified: N70.93

## 2022-02-04 LAB — URINALYSIS, ROUTINE W REFLEX MICROSCOPIC
Glucose, UA: NEGATIVE mg/dL
Hgb urine dipstick: NEGATIVE
Ketones, ur: NEGATIVE mg/dL
Leukocytes,Ua: NEGATIVE
Nitrite: NEGATIVE
Protein, ur: 30 mg/dL — AB
Specific Gravity, Urine: 1.025 (ref 1.005–1.030)
pH: 5.5 (ref 5.0–8.0)

## 2022-02-04 LAB — CBC WITH DIFFERENTIAL/PLATELET
Abs Immature Granulocytes: 0.07 10*3/uL (ref 0.00–0.07)
Basophils Absolute: 0 10*3/uL (ref 0.0–0.1)
Basophils Relative: 0 %
Eosinophils Absolute: 0 10*3/uL (ref 0.0–0.5)
Eosinophils Relative: 0 %
HCT: 33.4 % — ABNORMAL LOW (ref 36.0–46.0)
Hemoglobin: 10.9 g/dL — ABNORMAL LOW (ref 12.0–15.0)
Immature Granulocytes: 1 %
Lymphocytes Relative: 18 %
Lymphs Abs: 2 10*3/uL (ref 0.7–4.0)
MCH: 28.8 pg (ref 26.0–34.0)
MCHC: 32.6 g/dL (ref 30.0–36.0)
MCV: 88.1 fL (ref 80.0–100.0)
Monocytes Absolute: 0.3 10*3/uL (ref 0.1–1.0)
Monocytes Relative: 3 %
Neutro Abs: 8.8 10*3/uL — ABNORMAL HIGH (ref 1.7–7.7)
Neutrophils Relative %: 78 %
Platelets: 621 10*3/uL — ABNORMAL HIGH (ref 150–400)
RBC: 3.79 MIL/uL — ABNORMAL LOW (ref 3.87–5.11)
RDW: 14.8 % (ref 11.5–15.5)
WBC Morphology: INCREASED
WBC: 11.3 10*3/uL — ABNORMAL HIGH (ref 4.0–10.5)
nRBC: 0 % (ref 0.0–0.2)

## 2022-02-04 LAB — LACTIC ACID, PLASMA
Lactic Acid, Venous: 2.7 mmol/L (ref 0.5–1.9)
Lactic Acid, Venous: 1.3 mmol/L (ref 0.5–1.9)

## 2022-02-04 LAB — BASIC METABOLIC PANEL
Anion gap: 12 (ref 5–15)
BUN: 9 mg/dL (ref 6–20)
CO2: 19 mmol/L — ABNORMAL LOW (ref 22–32)
Calcium: 8.3 mg/dL — ABNORMAL LOW (ref 8.9–10.3)
Chloride: 106 mmol/L (ref 98–111)
Creatinine, Ser: 1.03 mg/dL — ABNORMAL HIGH (ref 0.44–1.00)
GFR, Estimated: >60 mL/min (ref >60)
Glucose, Bld: 128 mg/dL — ABNORMAL HIGH (ref 70–99)
Potassium: 4.6 mmol/L (ref 3.5–5.1)
Sodium: 137 mmol/L (ref 135–145)

## 2022-02-04 LAB — CBC
HCT: 33.5 % — ABNORMAL LOW (ref 36.0–46.0)
Hemoglobin: 11 g/dL — ABNORMAL LOW (ref 12.0–15.0)
MCH: 29 pg (ref 26.0–34.0)
MCHC: 32.8 g/dL (ref 30.0–36.0)
MCV: 88.4 fL (ref 80.0–100.0)
Platelets: 617 10*3/uL — ABNORMAL HIGH (ref 150–400)
RBC: 3.79 MIL/uL — ABNORMAL LOW (ref 3.87–5.11)
RDW: 14.9 % (ref 11.5–15.5)
WBC: 10.8 10*3/uL — ABNORMAL HIGH (ref 4.0–10.5)
nRBC: 0 % (ref 0.0–0.2)

## 2022-02-04 LAB — COMPREHENSIVE METABOLIC PANEL
ALT: 78 U/L — ABNORMAL HIGH (ref 0–44)
AST: 60 U/L — ABNORMAL HIGH (ref 15–41)
Albumin: 2.7 g/dL — ABNORMAL LOW (ref 3.5–5.0)
Alkaline Phosphatase: 90 U/L (ref 38–126)
Anion gap: 10 (ref 5–15)
BUN: 16 mg/dL (ref 6–20)
CO2: 22 mmol/L (ref 22–32)
Calcium: 8.5 mg/dL — ABNORMAL LOW (ref 8.9–10.3)
Chloride: 102 mmol/L (ref 98–111)
Creatinine, Ser: 1.52 mg/dL — ABNORMAL HIGH (ref 0.44–1.00)
GFR, Estimated: 47 mL/min — ABNORMAL LOW (ref >60)
Glucose, Bld: 119 mg/dL — ABNORMAL HIGH (ref 70–99)
Potassium: 4.6 mmol/L (ref 3.5–5.1)
Sodium: 134 mmol/L — ABNORMAL LOW (ref 135–145)
Total Bilirubin: 1.8 mg/dL — ABNORMAL HIGH (ref 0.3–1.2)
Total Protein: 7.8 g/dL (ref 6.5–8.1)

## 2022-02-04 LAB — GLUCOSE, CAPILLARY
Glucose-Capillary: 135 mg/dL — ABNORMAL HIGH (ref 70–99)
Glucose-Capillary: 179 mg/dL — ABNORMAL HIGH (ref 70–99)
Glucose-Capillary: 123 mg/dL — ABNORMAL HIGH (ref 70–99)

## 2022-02-04 LAB — MRSA NEXT GEN BY PCR, NASAL: MRSA by PCR Next Gen: DETECTED — AB

## 2022-02-04 LAB — WET PREP, GENITAL
Sperm: NONE SEEN
Trich, Wet Prep: NONE SEEN
WBC, Wet Prep HPF POC: >=10 — AB (ref <10)
Yeast Wet Prep HPF POC: NONE SEEN

## 2022-02-04 LAB — URINALYSIS, MICROSCOPIC (REFLEX)

## 2022-02-04 LAB — RESP PANEL BY RT-PCR (FLU A&B, COVID) ARPGX2
Influenza A by PCR: NEGATIVE
Influenza B by PCR: NEGATIVE
SARS Coronavirus 2 by RT PCR: NEGATIVE

## 2022-02-04 LAB — LIPASE, BLOOD: Lipase: 23 U/L (ref 11–51)

## 2022-02-04 LAB — PROTIME-INR
INR: 1.2 (ref 0.8–1.2)
Prothrombin Time: 15 seconds (ref 11.4–15.2)

## 2022-02-04 LAB — HCG, QUANTITATIVE, PREGNANCY: hCG, Beta Chain, Quant, S: 1 m[IU]/mL (ref <5)

## 2022-02-04 LAB — HCG, SERUM, QUALITATIVE: Preg, Serum: NEGATIVE

## 2022-02-04 LAB — APTT: aPTT: 25 seconds (ref 24–36)

## 2022-02-04 MED ORDER — NOREPINEPHRINE 4 MG/250ML-% IV SOLN
2.0000 ug/min | INTRAVENOUS | Status: DC
  Administered 2022-02-04: 10 ug/min via INTRAVENOUS
  Administered 2022-02-05: 9 ug/min via INTRAVENOUS
  Administered 2022-02-05: 7 ug/min via INTRAVENOUS
  Filled 2022-02-04 (×3): qty 250

## 2022-02-04 MED ORDER — KETAMINE HCL 50 MG/5ML IJ SOSY
PREFILLED_SYRINGE | INTRAMUSCULAR | Status: AC
  Filled 2022-02-04: qty 5

## 2022-02-04 MED ORDER — METRONIDAZOLE 500 MG/100ML IV SOLN
500.0000 mg | Freq: Two times a day (BID) | INTRAVENOUS | Status: DC
  Administered 2022-02-04 – 2022-02-06 (×4): 500 mg via INTRAVENOUS
  Filled 2022-02-04 (×4): qty 100

## 2022-02-04 MED ORDER — ONDANSETRON HCL 4 MG/2ML IJ SOLN
INTRAMUSCULAR | Status: AC
  Filled 2022-02-04: qty 2

## 2022-02-04 MED ORDER — ORAL CARE MOUTH RINSE: 15.0000 mL | OROMUCOSAL | Status: DC | PRN

## 2022-02-04 MED ORDER — KETAMINE HCL 10 MG/ML IJ SOLN
0.1500 mg/kg | Freq: Once | INTRAMUSCULAR | Status: AC
  Administered 2022-02-04: 12 mg via INTRAVENOUS
  Filled 2022-02-04: qty 1

## 2022-02-04 MED ORDER — CHLORHEXIDINE GLUCONATE CLOTH 2 % EX PADS
6.0000 | MEDICATED_PAD | Freq: Every day | CUTANEOUS | Status: DC
  Administered 2022-02-04 – 2022-02-06 (×3): 6 via TOPICAL

## 2022-02-04 MED ORDER — DEXTROSE 5 % IV SOLN
1000.0000 mg | Freq: Once | INTRAVENOUS | Status: AC
  Administered 2022-02-04: 1000 mg via INTRAVENOUS
  Filled 2022-02-04: qty 10

## 2022-02-04 MED ORDER — DOCUSATE SODIUM 100 MG PO CAPS: 100.0000 mg | ORAL_CAPSULE | Freq: Two times a day (BID) | ORAL | Status: DC | PRN

## 2022-02-04 MED ORDER — ONDANSETRON HCL 4 MG/2ML IJ SOLN
4.0000 mg | Freq: Four times a day (QID) | INTRAMUSCULAR | Status: DC | PRN
  Administered 2022-02-04 – 2022-02-06 (×3): 4 mg via INTRAVENOUS
  Filled 2022-02-04 (×3): qty 2

## 2022-02-04 MED ORDER — LACTATED RINGERS IV BOLUS (SEPSIS)
500.0000 mL | Freq: Once | INTRAVENOUS | Status: AC
  Administered 2022-02-04: 500 mL via INTRAVENOUS

## 2022-02-04 MED ORDER — PHENYLEPHRINE 80 MCG/ML (10ML) SYRINGE FOR IV PUSH (FOR BLOOD PRESSURE SUPPORT)
PREFILLED_SYRINGE | INTRAVENOUS | Status: DC
Start: 2022-02-04 — End: 2022-02-04
  Filled 2022-02-04: qty 10

## 2022-02-04 MED ORDER — PHENYLEPHRINE 80 MCG/ML (10ML) SYRINGE FOR IV PUSH (FOR BLOOD PRESSURE SUPPORT)
100.0000 ug | PREFILLED_SYRINGE | Freq: Once | INTRAVENOUS | Status: AC
Start: 2022-02-04 — End: 2022-02-04
  Administered 2022-02-04: 100 ug via INTRAVENOUS

## 2022-02-04 MED ORDER — LACTATED RINGERS IV BOLUS (SEPSIS)
1000.0000 mL | Freq: Once | INTRAVENOUS | Status: AC
  Administered 2022-02-04: 1000 mL via INTRAVENOUS

## 2022-02-04 MED ORDER — MIDAZOLAM HCL 2 MG/2ML IJ SOLN
INTRAMUSCULAR | Status: AC
  Filled 2022-02-04: qty 2

## 2022-02-04 MED ORDER — PROCHLORPERAZINE EDISYLATE 10 MG/2ML IJ SOLN: 10.0000 mg | Freq: Four times a day (QID) | INTRAMUSCULAR | Status: DC | PRN

## 2022-02-04 MED ORDER — HYDROMORPHONE HCL 1 MG/ML IJ SOLN
1.0000 mg | Freq: Once | INTRAMUSCULAR | Status: DC
  Filled 2022-02-04: qty 1

## 2022-02-04 MED ORDER — MIDAZOLAM HCL 2 MG/2ML IJ SOLN: 2.0000 mg | Freq: Once | INTRAMUSCULAR | Status: DC

## 2022-02-04 MED ORDER — HEPARIN SODIUM (PORCINE) 5000 UNIT/ML IJ SOLN
5000.0000 [IU] | Freq: Three times a day (TID) | INTRAMUSCULAR | Status: DC
  Administered 2022-02-04 – 2022-02-07 (×8): 5000 [IU] via SUBCUTANEOUS
  Filled 2022-02-04 (×8): qty 1

## 2022-02-04 MED ORDER — SODIUM CHLORIDE 0.9 % IV SOLN
2.0000 g | INTRAVENOUS | Status: DC
  Administered 2022-02-04 – 2022-02-06 (×3): 2 g via INTRAVENOUS
  Filled 2022-02-04 (×3): qty 20

## 2022-02-04 MED ORDER — FENTANYL CITRATE (PF) 100 MCG/2ML IJ SOLN
75.0000 ug | Freq: Once | INTRAMUSCULAR | Status: AC
  Administered 2022-02-04: 75 ug via INTRAVENOUS
  Filled 2022-02-04: qty 2

## 2022-02-04 MED ORDER — SODIUM CHLORIDE 0.9 % IV SOLN
250.0000 mL | INTRAVENOUS | Status: DC
  Administered 2022-02-04 – 2022-02-06 (×2): 250 mL via INTRAVENOUS

## 2022-02-04 MED ORDER — FENTANYL CITRATE PF 50 MCG/ML IJ SOSY
50.0000 ug | PREFILLED_SYRINGE | Freq: Once | INTRAMUSCULAR | Status: AC
  Administered 2022-02-04: 50 ug via INTRAVENOUS
  Filled 2022-02-04: qty 1

## 2022-02-04 MED ORDER — PIPERACILLIN-TAZOBACTAM 3.375 G IVPB: 3.3750 g | Freq: Three times a day (TID) | INTRAVENOUS | Status: DC

## 2022-02-04 MED ORDER — DIPHENHYDRAMINE HCL 50 MG/ML IJ SOLN
50.0000 mg | Freq: Once | INTRAMUSCULAR | Status: AC
  Administered 2022-02-04: 50 mg via INTRAVENOUS
  Filled 2022-02-04: qty 1

## 2022-02-04 MED ORDER — FENTANYL CITRATE (PF) 100 MCG/2ML IJ SOLN: 100.0000 ug | Freq: Once | INTRAMUSCULAR | Status: DC

## 2022-02-04 MED ORDER — ONDANSETRON HCL 4 MG/2ML IJ SOLN
4.0000 mg | Freq: Once | INTRAMUSCULAR | Status: AC
  Administered 2022-02-04: 4 mg via INTRAVENOUS

## 2022-02-04 MED ORDER — KETAMINE HCL 50 MG/5ML IJ SOSY
1.0000 mg/kg | PREFILLED_SYRINGE | Freq: Once | INTRAMUSCULAR | Status: DC
Start: 2022-02-04 — End: 2022-02-05

## 2022-02-04 MED ORDER — ROCURONIUM BROMIDE 10 MG/ML (PF) SYRINGE
PREFILLED_SYRINGE | INTRAVENOUS | Status: AC
  Filled 2022-02-04: qty 10

## 2022-02-04 MED ORDER — PIPERACILLIN-TAZOBACTAM 3.375 G IVPB 30 MIN
3.3750 g | Freq: Once | INTRAVENOUS | Status: AC
  Administered 2022-02-04: 3.375 g via INTRAVENOUS

## 2022-02-04 MED ORDER — POLYETHYLENE GLYCOL 3350 17 G PO PACK: 17.0000 g | PACK | Freq: Every day | ORAL | Status: DC | PRN

## 2022-02-04 MED ORDER — DOXYCYCLINE HYCLATE 100 MG PO TABS
100.0000 mg | ORAL_TABLET | Freq: Two times a day (BID) | ORAL | Status: DC
Start: 2022-02-04 — End: 2022-02-07
  Administered 2022-02-04 – 2022-02-07 (×6): 100 mg via ORAL
  Filled 2022-02-04 (×6): qty 1

## 2022-02-04 MED ORDER — MIDAZOLAM HCL 2 MG/2ML IJ SOLN
2.0000 mg | Freq: Once | INTRAMUSCULAR | Status: DC | PRN
  Filled 2022-02-04: qty 2

## 2022-02-04 MED ORDER — ROCURONIUM BROMIDE 50 MG/5ML IV SOLN
1.0000 mg/kg | Freq: Once | INTRAVENOUS | Status: DC
Start: 2022-02-04 — End: 2022-02-05
  Filled 2022-02-04: qty 7.71

## 2022-02-04 MED ORDER — KETAMINE HCL 10 MG/ML IJ SOLN: 0.3000 mg/kg | Freq: Once | INTRAMUSCULAR | Status: DC

## 2022-02-04 MED ORDER — FENTANYL CITRATE (PF) 100 MCG/2ML IJ SOLN
INTRAMUSCULAR | Status: AC
  Filled 2022-02-04: qty 2

## 2022-02-04 MED ORDER — NOREPINEPHRINE 4 MG/250ML-% IV SOLN
INTRAVENOUS | Status: AC
  Administered 2022-02-04: 2 ug/min via INTRAVENOUS
  Filled 2022-02-04: qty 250

## 2022-02-04 MED ORDER — LACTATED RINGERS IV BOLUS (SEPSIS)
1000.0000 mL | Freq: Once | INTRAVENOUS | Status: AC
Start: 2022-02-04 — End: 2022-02-04
  Administered 2022-02-04: 1000 mL via INTRAVENOUS

## 2022-02-04 MED ORDER — LACTATED RINGERS IV SOLN: INTRAVENOUS | Status: AC

## 2022-02-04 MED ORDER — INSULIN ASPART 100 UNIT/ML IJ SOLN
2.0000 [IU] | INTRAMUSCULAR | Status: DC
  Administered 2022-02-04 (×2): 2 [IU] via SUBCUTANEOUS
  Administered 2022-02-04: 4 [IU] via SUBCUTANEOUS
  Administered 2022-02-05 – 2022-02-06 (×3): 2 [IU] via SUBCUTANEOUS

## 2022-02-04 MED ORDER — OXYCODONE HCL 5 MG PO TABS
5.0000 mg | ORAL_TABLET | ORAL | Status: DC | PRN
  Administered 2022-02-04 – 2022-02-07 (×12): 10 mg via ORAL
  Filled 2022-02-04 (×13): qty 2

## 2022-02-04 MED ORDER — HYDROMORPHONE HCL 1 MG/ML IJ SOLN
0.5000 mg | INTRAMUSCULAR | Status: DC | PRN
  Administered 2022-02-04 – 2022-02-07 (×12): 1 mg via INTRAVENOUS
  Filled 2022-02-04 (×12): qty 1

## 2022-02-04 NOTE — Progress Notes (Signed)
Patient refused exam.  02/04/22.

## 2022-02-04 NOTE — Progress Notes (Addendum)
  After d/w OB physician, pt now again willing to try analgesia and anxiolysis optimization for imaging, which would certainly be of less risk than intubation to facilitate imaging     ___________________________________  Had recently agreed for MRI L spine and CT a/p after many efforts at pain management & was resting comfortably, even able to sleep.   When she got down to rad, pt reportedly then refused to move off of bed citing pain and was returned to ICU  In ICU, Dr Chestine Spore and myself spoke with the patient   about the importance and urgency of these scans, potential implications for care. Pt stated "then you need to put me to sleep" and indicated she would not otherwise agree to imaging. Obviously intubation for this purpose is not optimal and poses additional risks. We discussed this with the patient extensively. She is adamant that she be intubated/sedated to facilitate imaging.    Tessie Fass MSN, AGACNP-BC [facility] Pulmonary/Critical Care Medicine 02/04/2022, 4:03 PM

## 2022-02-04 NOTE — Consult Note (Signed)
OBSTETRICS AND GYNECOLOGY ATTENDING CONSULT NOTE  Consult Date: 02/04/2022 Reason for Consult: Sepsis, TOA, Pyometrium Consulting Provider: Eliseo Gum, NP   Assessment/Plan: Continue Cefriaxone, Flagyl and Doxycycline for now. Analgesia as needed. No surgical intervention necessary for now, will follow up CT results Will defer management of sepsis to the CCM team, appreciate their expertise.  Appreciate care of Paula Lucas by her primary team We will follow along with daily rounding on patient Please call [phone] [facility] OB/GYN Consult Attending Monday-Friday 8am - 5pm) or [phone] [facility] OB/GYN Attending On Call all day, every day) for any gynecologic concerns at any time.  Thank you for involving [place] in the care of this patient.  Total consultation time including face-to-face time with patient (>50% of time), reviewing chart and documentation: 60 minutes  Verita Schneiders, MD, [place], [facility] [employer] [facility]    History of Present Illness: Paula Lucas is an 31 y.o. [id number] female who was admitted with sepsis in the setting of left tuboovarian abscess and pyometrium.  History of gonorrhea, chlamydia and TOA admission in 2019.  She presented to [facility] ER today with worsening pain and vaginal bleeding, also endorsed leg numbness. She was found to be febrile to 101.6 and hypotensive and met criteria for sepsis (^LFTs, AKI lactic acidosis), sepsis protocol initiated. Ultrasound showed left adnexal collection and intrauterine collection; on my review of images, concerning for left TOA and pyometrium.  She was initially on Zosyn, changed to Ceftriaxone, Doxycyline and Flagyl upon transfer to [facility] ICU.  Has required some vasopressors to main MAP. Has been ordered for CT abdomen/pelvis for further characterization of ultrasound findings. On our encounter, she describes having severe pain and was hesitant to go  for CT scan but is now agreeing to do so.   No other acute concerns.  Pertinent OB/GYN History: No LMP recorded. (Menstrual status: Other). OB History  Gravida Para Term Preterm AB Living  2 0 0 0 1 0  SAB IAB Ectopic Multiple Live Births  0 1 0 0 0    # Outcome Date GA Lbr Len/2nd Weight Sex Delivery Anes PTL Lv  2 Gravida           1 IAB             Patient Active Problem List   Diagnosis Date Noted   Septic shock ([place]) 02/04/2022   Pyometrium    TOA (tubo-ovarian abscess) 04/01/2018    Past Medical History:  Diagnosis Date   Asthma    BMI 31.0-31.9,adult    Chlamydia    Gonorrhea    TOA (tubo-ovarian abscess)     Past Surgical History:  Procedure Laterality Date   INDUCED ABORTION     IR RADIOLOGIST EVAL & MGMT  05/17/2018   TONSILLECTOMY      Family History  Problem Relation Age of Onset   Diabetes Mother    Hypertension Mother    Diabetes Father    Hypertension Father    Diabetes Sister    Hypertension Sister     Social History:  reports that she has been smoking cigarettes. She has a 4.50 pack-year smoking history. She has never used smokeless tobacco. She reports current drug use. Drug: Marijuana. She reports that she does not drink alcohol.  Allergies:  Allergies  Allergen Reactions   Fish Allergy Anaphylaxis    Severe allergic reaction to all seafoods   Motrin [Ibuprofen] Anaphylaxis  Pt tolerated Ketorolac 04/02/18   Mushroom Extract Complex Anaphylaxis   Shellfish-Derived Products Anaphylaxis   Tylenol [Acetaminophen] Anaphylaxis    Medications: I have reviewed the patient's current medications. Prior to Admission:  Medications Prior to Admission  Medication Sig Dispense Refill Last Dose   doxycycline (VIBRAMYCIN) 100 MG capsule Take 1 capsule (100 mg total) by mouth 2 (two) times daily. (Patient not taking: Reported on 02/04/2022) 20 capsule 0 Not Taking   hydrOXYzine (ATARAX/VISTARIL) 25 MG tablet Take 1 tablet (25 mg total) by mouth  every 6 (six) hours. (Patient not taking: Reported on 02/04/2022) 12 tablet 0 Not Taking   methylPREDNISolone (MEDROL DOSEPAK) 4 MG TBPK tablet Follow package insert (Patient not taking: Reported on 02/04/2022) 21 each 0 Not Taking   penicillin v potassium (VEETID) 500 MG tablet Take 1 tablet (500 mg total) by mouth 3 (three) times daily. (Patient not taking: Reported on 02/04/2022) 30 tablet 0 Not Taking   permethrin (ELIMITE) 5 % cream Apply to affected area once (Patient not taking: Reported on 02/04/2022) 60 g 0 Not Taking   traMADol (ULTRAM) 50 MG tablet Take 1 tablet (50 mg total) by mouth every 6 (six) hours as needed. (Patient not taking: Reported on 02/04/2022) 10 tablet 0 Not Taking   Scheduled:  diphenhydrAMINE  50 mg Intravenous Once   doxycycline  100 mg Oral Q12H   fentaNYL (SUBLIMAZE) injection  100 mcg Intravenous Once   heparin  5,000 Units Subcutaneous Q8H    HYDROmorphone (DILAUDID) injection  1 mg Intravenous Once   insulin aspart  2-6 Units Subcutaneous Q4H   ketamine (KETALAR) injection 10mg /mL (IV use)  1 mg/kg Intravenous Once   rocuronium  1 mg/kg Intravenous Once   Continuous:  sodium chloride 250 mL (02/04/22 1505)   azithromycin     cefTRIAXone (ROCEPHIN)  IV     lactated ringers 150 mL/hr at 02/04/22 1208   metronidazole     norepinephrine (LEVOPHED) Adult infusion 3 mcg/min (02/04/22 1536)   KGU:RKYHCWCB sodium, HYDROmorphone (DILAUDID) injection, midazolam, ondansetron (ZOFRAN) IV, oxyCODONE, polyethylene glycol, prochlorperazine Anti-infectives (From admission, onward)    Start     Dose/Rate Route Frequency Ordered Stop   02/04/22 2200  doxycycline (VIBRA-TABS) tablet 100 mg        100 mg Oral Every 12 hours 02/04/22 1658     02/04/22 1800  metroNIDAZOLE (FLAGYL) IVPB 500 mg        500 mg 100 mL/hr over 60 Minutes Intravenous Every 12 hours 02/04/22 1658     02/04/22 1800  cefTRIAXone (ROCEPHIN) 2 g in sodium chloride 0.9 % 100 mL IVPB        2 g 200  mL/hr over 30 Minutes Intravenous Every 24 hours 02/04/22 1704     02/04/22 1600  piperacillin-tazobactam (ZOSYN) IVPB 3.375 g  Status:  Discontinued        3.375 g 12.5 mL/hr over 240 Minutes Intravenous Every 8 hours 02/04/22 0951 02/04/22 1658   02/04/22 1600  azithromycin (ZITHROMAX) 1,000 mg in dextrose 5 % 250 mL IVPB        1,000 mg 260 mL/hr over 60 Minutes Intravenous  Once 02/04/22 1514     02/04/22 0930  piperacillin-tazobactam (ZOSYN) IVPB 3.375 g        3.375 g 100 mL/hr over 30 Minutes Intravenous  Once 02/04/22 0925 02/04/22 1013       Review of Systems: Pertinent items noted in HPI and remainder of comprehensive ROS otherwise negative.  Focused Physical  Examination: BP (!) 105/53 (BP Location: Left Arm)   Pulse 98   Temp 100.2 F (37.9 C) (Oral)   Resp 16   Ht $R'5\' 3"'Mi$  (1.6 m)   Wt 77.1 kg   SpO2 97%   Breastfeeding Unknown   BMI 30.10 kg/m  CONSTITUTIONAL: Well-developed, well-nourished female in no acute distress.  NEUROLOGIC: Alert and oriented to person, place, and time. Normal reflexes, muscle tone coordination. No cranial nerve deficit noted. CARDIOVASCULAR: Normal heart rate noted, regular rhythm RESPIRATORY: Effort and breath sounds normal, no problems with respiration noted. ABDOMEN: Patient adamantly refused for ne to tough her abdomen PELVIC: Deferred MUSCULOSKELETAL:  No cyanosis, clubbing, or edema.  2+ distal pulses.  Labs and Imaging: Results for orders placed or performed during the hospital encounter of 02/04/22 (from the past 72 hour(s))  hCG, quantitative, pregnancy     Status: None   Collection Time: 02/04/22  9:00 AM  Result Value Ref Range   hCG, Beta Chain, Quant, S 1 <5 mIU/mL    Comment:          GEST. AGE      CONC.  (mIU/mL)   <=1 WEEK        5 - 50     2 WEEKS       50 - 500     3 WEEKS       100 - 10,000     4 WEEKS     1,000 - 30,000     5 WEEKS     3,500 - 115,000   6-8 WEEKS     12,000 - 270,000    12 WEEKS     15,000 -  220,000        FEMALE AND NON-PREGNANT FEMALE:     LESS THAN 5 mIU/mL Performed at [facility], [address] [place], [place] [address]   Lipase, blood     Status: None   Collection Time: 02/04/22  9:01 AM  Result Value Ref Range   Lipase 23 11 - 51 U/L    Comment: Performed at [facility], [address] [place], [place] [address]  Comprehensive metabolic panel     Status: Abnormal   Collection Time: 02/04/22  9:01 AM  Result Value Ref Range   Sodium 134 (L) 135 - 145 mmol/L   Potassium 4.6 3.5 - 5.1 mmol/L   Chloride 102 98 - 111 mmol/L   CO2 22 22 - 32 mmol/L   Glucose, Bld 119 (H) 70 - 99 mg/dL    Comment: Glucose reference range applies only to samples taken after fasting for at least 8 hours.   BUN 16 6 - 20 mg/dL   Creatinine, Ser 1.52 (H) 0.44 - 1.00 mg/dL   Calcium 8.5 (L) 8.9 - 10.3 mg/dL   Total Protein 7.8 6.5 - 8.1 g/dL   Albumin 2.7 (L) 3.5 - 5.0 g/dL   AST 60 (H) 15 - 41 U/L   ALT 78 (H) 0 - 44 U/L   Alkaline Phosphatase 90 38 - 126 U/L   Total Bilirubin 1.8 (H) 0.3 - 1.2 mg/dL   GFR, Estimated 47 (L) >60 mL/min    Comment: (NOTE) Calculated using the CKD-EPI Creatinine Equation (2021)    Anion gap 10 5 - 15    Comment: Performed at [facility], [address] [place], [place] [address]  CBC     Status: Abnormal   Collection Time: 02/04/22  9:01 AM  Result Value Ref Range   WBC 10.8 (H) 4.0 - 10.5 K/uL   RBC 3.79 (L) 3.87 - 5.11 MIL/uL   Hemoglobin 11.0 (L) 12.0 - 15.0 g/dL   HCT 33.5 (L) 36.0 - 46.0 %   MCV 88.4 80.0 - 100.0 fL   MCH 29.0 26.0 - 34.0 pg   MCHC 32.8 30.0 - 36.0 g/dL   RDW 14.9 11.5 - 15.5 %   Platelets 617 (H) 150 - 400 K/uL   nRBC 0.0 0.0 - 0.2 %    Comment: Performed at [facility], [address] [place], [place] [address]  hCG, serum, qualitative     Status: None   Collection Time: 02/04/22  9:01 AM  Result Value Ref Range   Preg, Serum NEGATIVE NEGATIVE    Comment:        THE  SENSITIVITY OF THIS METHODOLOGY IS >10 mIU/mL. Performed at [facility], [address] [place], [place] [address]   Lactic acid, plasma     Status: Abnormal   Collection Time: 02/04/22  9:01 AM  Result Value Ref Range   Lactic Acid, Venous 2.7 (HH) 0.5 - 1.9 mmol/L    Comment: CRITICAL RESULT CALLED TO, READ BACK BY AND VERIFIED WITH: Lissa Morales RN ON 02/04/22 AT 1026 [facility] Performed at [facility], [address] [place], [place] [address]   CBC with Differential     Status: Abnormal   Collection Time: 02/04/22  9:01 AM  Result Value Ref Range   WBC 11.3 (H) 4.0 - 10.5 K/uL   RBC 3.79 (L) 3.87 - 5.11 MIL/uL   Hemoglobin 10.9 (L) 12.0 - 15.0 g/dL   HCT 33.4 (L) 36.0 - 46.0 %   MCV 88.1 80.0 - 100.0 fL   MCH 28.8 26.0 - 34.0 pg   MCHC 32.6 30.0 - 36.0 g/dL   RDW 14.8 11.5 - 15.5 %   Platelets 621 (H) 150 - 400 K/uL   nRBC 0.0 0.0 - 0.2 %   Neutrophils Relative % 78 %   Neutro Abs 8.8 (H) 1.7 - 7.7 K/uL   Lymphocytes Relative 18 %   Lymphs Abs 2.0 0.7 - 4.0 K/uL   Monocytes Relative 3 %   Monocytes Absolute 0.3 0.1 - 1.0 K/uL   Eosinophils Relative 0 %   Eosinophils Absolute 0.0 0.0 - 0.5 K/uL   Basophils Relative 0 %   Basophils Absolute 0.0 0.0 - 0.1 K/uL   WBC Morphology INCREASED BANDS (>20% BANDS)    RBC Morphology MORPHOLOGY UNREMARKABLE    Smear Review MORPHOLOGY UNREMARKABLE    Immature Granulocytes 1 %   Abs Immature Granulocytes 0.07 0.00 - 0.07 K/uL    Comment: Performed at [facility], [address] [place], [place] [address]  Protime-INR     Status: None   Collection Time: 02/04/22  9:01 AM  Result Value Ref Range   Prothrombin Time 15.0 11.4 - 15.2 seconds   INR 1.2 0.8 - 1.2    Comment: (NOTE) INR goal varies based on device and disease states. Performed at [facility], [address] [place], [place] [address]   APTT     Status: None   Collection Time: 02/04/22  9:01 AM  Result Value Ref Range    aPTT 25 24 - 36 seconds    Comment: Performed at [facility], [address] [place], [place]  [address]  Resp Panel by RT-PCR (Flu A&B, Covid) Anterior Nasal Swab     Status: None   Collection Time: 02/04/22  9:10 AM   Specimen: Anterior Nasal Swab  Result Value Ref Range   SARS Coronavirus 2 by RT PCR NEGATIVE NEGATIVE    Comment: (NOTE) SARS-CoV-2 target nucleic acids are NOT DETECTED.  The SARS-CoV-2 RNA is generally detectable in upper respiratory specimens during the acute phase of infection. The lowest concentration of SARS-CoV-2 viral copies this assay can detect is 138 copies/mL. A negative result does not preclude SARS-Cov-2 infection and should not be used as the sole basis for treatment or other patient management decisions. A negative result may occur with  improper specimen collection/handling, submission of specimen other than nasopharyngeal swab, presence of viral mutation(s) within the areas targeted by this assay, and inadequate number of viral copies(<138 copies/mL). A negative result must be combined with clinical observations, patient history, and epidemiological information. The expected result is Negative.  Fact Sheet for Patients:  EntrepreneurPulse.com.au  Fact Sheet for Healthcare Providers:  IncredibleEmployment.be  This test is no t yet approved or cleared by the [place] FDA and  has been authorized for detection and/or diagnosis of SARS-CoV-2 by FDA under an Emergency Use Authorization (EUA). This EUA will remain  in effect (meaning this test can be used) for the duration of the COVID-19 declaration under Section 564(b)(1) of the Act, 21 U.S.C.section 360bbb-3(b)(1), unless the authorization is terminated  or revoked sooner.       Influenza A by PCR NEGATIVE NEGATIVE   Influenza B by PCR NEGATIVE NEGATIVE    Comment: (NOTE) The Xpert Xpress SARS-CoV-2/FLU/RSV plus assay is intended as an  aid in the diagnosis of influenza from Nasopharyngeal swab specimens and should not be used as a sole basis for treatment. Nasal washings and aspirates are unacceptable for Xpert Xpress SARS-CoV-2/FLU/RSV testing.  Fact Sheet for Patients: EntrepreneurPulse.com.au  Fact Sheet for Healthcare Providers: IncredibleEmployment.be  This test is not yet approved or cleared by the [place] FDA and has been authorized for detection and/or diagnosis of SARS-CoV-2 by FDA under an Emergency Use Authorization (EUA). This EUA will remain in effect (meaning this test can be used) for the duration of the COVID-19 declaration under Section 564(b)(1) of the Act, 21 U.S.C. section 360bbb-3(b)(1), unless the authorization is terminated or revoked.  Performed at [facility], [address] [place], [place] [address]   Blood Culture (routine x 2)     Status: None (Preliminary result)   Collection Time: 02/04/22  9:43 AM   Specimen: BLOOD RIGHT ARM  Result Value Ref Range   Specimen Description      BLOOD RIGHT ARM Performed at [facility] [address] [place], [place] [address]    Special Requests      BOTTLES DRAWN AEROBIC AND ANAEROBIC Blood Culture results may not be optimal due to an inadequate volume of blood received in culture bottles Performed at [facility], [address] [place], [place] [address]    Culture PENDING    Report Status PENDING   Wet prep, genital     Status: Abnormal   Collection Time: 02/04/22 10:28 AM   Specimen: Urine, In & Out Cath  Result Value Ref Range   Yeast Wet Prep HPF POC NONE SEEN NONE SEEN   Trich, Wet Prep NONE SEEN NONE SEEN   Clue Cells Wet Prep HPF POC PRESENT (A) NONE SEEN  WBC, Wet Prep HPF POC >=10 (A) <10   Sperm NONE SEEN     Comment: Performed at [facility], [address] [place], [place] [address]  Urinalysis, Routine w reflex microscopic Urine, In &  Out Cath     Status: Abnormal   Collection Time: 02/04/22 10:30 AM  Result Value Ref Range   Color, Urine AMBER (A) YELLOW    Comment: BIOCHEMICALS MAY BE AFFECTED BY COLOR   APPearance HAZY (A) CLEAR   Specific Gravity, Urine 1.025 1.005 - 1.030   pH 5.5 5.0 - 8.0   Glucose, UA NEGATIVE NEGATIVE mg/dL   Hgb urine dipstick NEGATIVE NEGATIVE   Bilirubin Urine MODERATE (A) NEGATIVE   Ketones, ur NEGATIVE NEGATIVE mg/dL   Protein, ur 30 (A) NEGATIVE mg/dL   Nitrite NEGATIVE NEGATIVE   Leukocytes,Ua NEGATIVE NEGATIVE    Comment: Performed at [facility], [address] [place], [place] [address]  Urinalysis, Microscopic (reflex)     Status: Abnormal   Collection Time: 02/04/22 10:30 AM  Result Value Ref Range   RBC / HPF 0-5 0 - 5 RBC/hpf   WBC, UA 6-10 0 - 5 WBC/hpf   Bacteria, UA FEW (A) NONE SEEN   Squamous Epithelial / LPF 0-5 0 - 5   Mucus PRESENT    Hyaline Casts, UA PRESENT     Comment: Performed at [facility], [address] [place], [place] [address]  Lactic acid, plasma     Status: None   Collection Time: 02/04/22 11:43 AM  Result Value Ref Range   Lactic Acid, Venous 1.3 0.5 - 1.9 mmol/L    Comment: Performed at [facility], [address] [place], [place] [address]  Basic metabolic panel     Status: Abnormal   Collection Time: 02/04/22  2:53 PM  Result Value Ref Range   Sodium 137 135 - 145 mmol/L   Potassium 4.6 3.5 - 5.1 mmol/L   Chloride 106 98 - 111 mmol/L   CO2 19 (L) 22 - 32 mmol/L   Glucose, Bld 128 (H) 70 - 99 mg/dL    Comment: Glucose reference range applies only to samples taken after fasting for at least 8 hours.   BUN 9 6 - 20 mg/dL   Creatinine, Ser 1.03 (H) 0.44 - 1.00 mg/dL   Calcium 8.3 (L) 8.9 - 10.3 mg/dL   GFR, Estimated >60 >60 mL/min    Comment: (NOTE) Calculated using the CKD-EPI Creatinine Equation (2021)    Anion gap 12 5 - 15    Comment: Performed at [facility] [address],  [place], [place] [address]  Glucose, capillary     Status: Abnormal   Collection Time: 02/04/22  4:22 PM  Result Value Ref Range   Glucose-Capillary 135 (H) 70 - 99 mg/dL    Comment: Glucose reference range applies only to samples taken after fasting for at least 8 hours.   Comment 1 Notify RN    Comment 2 Document in Chart     US PELVIC COMPLETE WITH TRANSVAGINAL  Addendum Date: 02/04/2022   ADDENDUM REPORT: 02/04/2022 13:44 ADDENDUM: In light of negative quantitative beta HCG and no documented pregnancy the possibility of infectious causes for the above findings may be more likely but findings are essentially quite nonspecific at this time. Even noncontrast CT could prove beneficial to allow for assessment of pelvic fluid to determine whether this represents frank hemoperitoneum and  to assess for any associated inflammatory changes that may be present not well assessed on ultrasound. These results were called by telephone at the time of interpretation on 02/04/2022 at 1:44 pm to provider [facility] , who verbally acknowledged these results. Electronically Signed   By: Zetta Bills M.D.   On: 02/04/2022 13:44   Result Date: 02/04/2022 CLINICAL DATA:  A 31 year old female presents with pelvic pain and bleeding. Patient reports positive pregnancy but urine pregnancy currently negative by report and beta HCG is currently pending. EXAM: TRANSABDOMINAL AND TRANSVAGINAL ULTRASOUND OF PELVIS TECHNIQUE: Both transabdominal and transvaginal ultrasound examinations of the pelvis were performed. Transabdominal technique was performed for global imaging of the pelvis including uterus, ovaries, adnexal regions, and pelvic cul-de-sac. It was necessary to proceed with endovaginal exam following the transabdominal exam to visualize the endometrium and adnexa. COMPARISON:  No recent imaging is available for comparison. FINDINGS: Uterus Measurements: 12.6 x 8.0 x 8.5 (volume = 450 cc) cm. = volume: 450 mL. Heterogeneous  uterus with thickened endometrium. Cystic area in the endometrial canal that measures 4.5 x 2.6 x 4.4 cm. This cystic characteristics and central increased echogenicity. Central increased echogenicity the without discrete finding that would suggest embryo with very limited assessment due to patient discomfort and clinical condition. Endometrium Thickness: 3.3 cm. Thickened with cystic area in the fundus of the uterus. Right ovary Measurements: 3.2 x 2.7 x 2.1 (volume = 9.5 cc) cm = volume: 9.5 mL. Normal appearance/no adnexal mass. Left ovary Not visible, in the area of the LEFT adnexa is heterogeneous material that has the appearance of blood products but conforms to the cul-de-sac and LEFT adnexa. Other findings Moderate heterogeneous fluid in the LEFT adnexa primarily but also tracking along the RIGHT uterus. IMPRESSION: Unusual cystic area in the endometrial canal. If this patient has any history of recent positive pregnancy test this could represent a failed gestation with retained products of conception. Pseudo gestational sac with areas of internal hemorrhage in the setting of ruptured ectopic pregnancy is also considered. Heterogeneous appearance of material in the cul-de-sac could also potentially represent sequela of pyogenic infection though this is lower on the differential at this time based on appearance. If the patient could tolerate further evaluation with cross-sectional imaging CT could potentially be helpful. Ultimately gyn consultation is suggested. Critical Value/emergent results were called by telephone at the time of interpretation on 02/04/2022 at 11:01 am to provider [facility] , who verbally acknowledged these results. Electronically Signed: By: Zetta Bills M.D. On: 02/04/2022 11:01   DG Chest Port 1 View  Result Date: 02/04/2022 CLINICAL DATA:  31 year old female with history of lower abdominal pain and vaginal bleeding 3 days ago. Fever. Low blood pressure. EXAM: PORTABLE CHEST  1 VIEW COMPARISON:  Chest x-ray 08/09/2021. FINDINGS: Lung volumes are slightly low. No consolidative airspace disease. No pleural effusions. No pneumothorax. No evidence of pulmonary edema. Heart size is normal. The patient is rotated to the right on today's exam, resulting in distortion of the mediastinal contours and reduced diagnostic sensitivity and specificity for mediastinal pathology. IMPRESSION: 1. No radiographic evidence of acute cardiopulmonary disease. Electronically Signed   By: Vinnie Langton M.D.   On: 02/04/2022 10:55

## 2022-02-04 NOTE — H&P (Signed)
NAME:  Paula Lucas, MRN:  [id number], DOB:  1990-10-09, LOS: 0 ADMISSION DATE:  02/04/2022, CONSULTATION DATE:  02/04/22 REFERRING MD:  Rhunette Croft - EM, CHIEF COMPLAINT:  abdominal pain // septic shock    History of Present Illness:   31 yo F PMH R tubo-ovarian abscess, Chlamydia, BV, Gonorrhea HTN, medication non-adherence,     Presented to [facility] ED 6/15 with continued c/o severe abdominal pain, as well as citing recent vaginal bleeding. Pt reported she is [redacted]wk pregnant (would be G2P1) as per 3 separate home pregnancy tests. 3 days ago the pt started experiencing vaginal bleeding. She describes this as "lots of clots and running down my legs" and says this has been ongoing. 1 day after bleeding started (2d prior to presentation) the pt started to have abrupt onset severe lower abdominal pain, radiating to back.   Pt first presented to [facility] 6/14 with severe abd pain, n/v in setting but left AMA prior to evaluation.  Notably, in ED serum pregnancy test is neg.  A transvag ultrasound was concerning for free fluid in pelvis and intrauterine mass  Hypotensive in ED despite 2L IVF and being started on NE. Started on pip/tazo.    PCCM consulted for admission for presumed septic shock    Pertinent  Medical History  Hx r ovarian abscess  Chlamydia, Gonorrhea, BV  Undifferentiated painful skin lesions   Significant Hospital Events: Including procedures, antibiotic start and stop dates in addition to other pertinent events   6/14 [facility] presentation for severe abdominal pain, reportedly [redacted]wk pregnant but left [facility] 6/15 [facility] ED presentation for severe abdominal pain. Admitting to [facility] ICU for septic shock. Septic abortion with ?retained products of contraception vs other etiology.   Interim History / Subjective:  Arrives to [facility] on 4 NE   Refusing bulk of exam. C/o severe pain -- worse in her back and persistent in abdomen   States "nobody is doing anything else with my vagina today"   Objective    Blood pressure (!) 93/57, pulse 97, temperature (!) 101.6 F (38.7 C), temperature source Oral, resp. rate (!) 29, height 5\' 3"  (1.6 m), weight 77.1 kg, SpO2 93 %, unknown if currently breastfeeding.       No intake or output data in the 24 hours ending 02/04/22 1140 Filed Weights   02/04/22 0847  Weight: 77.1 kg    Examination: Pt largely refusing physical exam due to pain  General: Pained appearing adult F in obvious discomfort laying on R side  HENT: NCAT pink mm anicteric sclera  Lungs: Symmetrical chest expansion, even unlabored on RA  Cardiovascular: rrr  GI: patient refuses exam GU: patient refuses exam Extremities: Patient refuses exam Neuro: AAOx3 following commands   Resolved Hospital Problem list     Assessment & Plan:   ?Incomplete spontaneous abortion -has reported to be [redacted]wk pregnant, but serum pregnancy test is negative. but had vaginal bleeding 3d prior to admission and no clear evidence of embryo on pelvic 02/06/22 but there is question of a pseudogestational sac + ?internal hemorrhage -ddx pseudocyesis -not sure how soon we would expect serum hcg to be neg following spontaneous abortion. Presumably timing would be associated with bleeding (started 6/12), but I'm sure it is possible there could have been fetal demise prior to bleeding. If it would not be expected at this point to have neg serum pregnancy result, favor pseudocyesis P -appreciate OBGYN consult -PRN dilaudid   Shock, presumed septic  -- concern for retained products  of contraception Possible PID (recurrent vs untx BV) Bacterial vaginosis  -on transvag US there looks to be free fluid in pelvis + atypical cystic mass that raises question for possible failed gestation with retained products of contraceptions. Again, with neg serum preg not sure if the patient was pregnant (not sure how quickly a serum preg test would be neg following spontaneous abortion)  -wet prep + clue cells, Wbc. Neg trich. Has  hg P -cont zosyn  -Appreciate OBGYN consult -sending cor CT a/p -- For CCM DO also c/o BLE numbness, has retained motor fxn.  MRI L spine ordered. Ordered as non-con but if renal fxn allows will change   Lactic acidosis -in setting of acute processes, supportive care   AKI -repeat STAT BMP  -AM CMP -follow UOP -cont IVF   Transaminitis -AM LFTs, coags   Anemia Thrombocytosis  -with worsening back pain, repeating stat H/H  -follow cbc    Best Practice (right click and "Reselect all SmartList Selections" daily)   Diet/type: NPO DVT prophylaxis: prophylactic heparin  GI prophylaxis: N/A Lines: N/A Foley:  N/A Code Status:  full code Last date of multidisciplinary goals of care discussion [pt updated 6/15]  Labs   CBC: Recent Labs  Lab 02/04/22 0901  WBC 11.3*  10.8*  NEUTROABS 8.8*  HGB 10.9*  11.0*  HCT 33.4*  33.5*  MCV 88.1  88.4  PLT 621*  617*    Basic Metabolic Panel: Recent Labs  Lab 02/04/22 0901  NA 134*  K 4.6  CL 102  CO2 22  GLUCOSE 119*  BUN 16  CREATININE 1.52*  CALCIUM 8.5*   GFR: Estimated Creatinine Clearance: 53.7 mL/min (A) (by C-G formula based on SCr of 1.52 mg/dL (H)). Recent Labs  Lab 02/04/22 0901  WBC 11.3*  10.8*  LATICACIDVEN 2.7*    Liver Function Tests: Recent Labs  Lab 02/04/22 0901  AST 60*  ALT 78*  ALKPHOS 90  BILITOT 1.8*  PROT 7.8  ALBUMIN 2.7*   Recent Labs  Lab 02/04/22 0901  LIPASE 23   No results for input(s): "AMMONIA" in the last 168 hours.  ABG    Component Value Date/Time   TCO2 21 09/04/2011 0230     Coagulation Profile: Recent Labs  Lab 02/04/22 0901  INR 1.2    Cardiac Enzymes: No results for input(s): "CKTOTAL", "CKMB", "CKMBINDEX", "TROPONINI" in the last 168 hours.  HbA1C: No results found for: "HGBA1C"  CBG: No results for input(s): "GLUCAP" in the last 168 hours.  Review of Systems:   Review of Systems  Constitutional:  Positive for fever.  HENT:  Negative.    Eyes: Negative.   Respiratory: Negative.    Cardiovascular: Negative.   Gastrointestinal:  Positive for abdominal pain, nausea and vomiting.  Genitourinary:  Positive for dysuria, flank pain and hematuria.  Musculoskeletal:  Positive for back pain.  Skin: Negative.   Neurological:  Positive for sensory change.  Endo/Heme/Allergies: Negative.   Psychiatric/Behavioral: Negative.       Past Medical History:  She,  has a past medical history of Asthma, Asthma, and BMI 31.0-31.9,adult.   Surgical History:   Past Surgical History:  Procedure Laterality Date   INDUCED ABORTION     IR RADIOLOGIST EVAL & MGMT  05/17/2018   TONSILLECTOMY       Social History:   reports that she has been smoking cigarettes. She has a 4.50 pack-year smoking history. She has never used smokeless tobacco. She reports current drug use.  Drug: Marijuana. She reports that she does not drink alcohol.   Family History:  Her family history includes Diabetes in her father, mother, and sister; Hypertension in her father, mother, and sister.   Allergies Allergies  Allergen Reactions   Fish Allergy Anaphylaxis    Severe allergic reaction to all seafoods   Ibuprofen Anaphylaxis    Pt tolerated Ketorolac 04/02/18   Mushroom Extract Complex Anaphylaxis   Shellfish-Derived Products Anaphylaxis   Tylenol [Acetaminophen] Anaphylaxis     Home Medications  Prior to Admission medications   Medication Sig Start Date End Date Taking? Authorizing Provider  doxycycline (VIBRAMYCIN) 100 MG capsule Take 1 capsule (100 mg total) by mouth 2 (two) times daily. 08/04/20   Liberty Handy, PA-C  hydrOXYzine (ATARAX/VISTARIL) 25 MG tablet Take 1 tablet (25 mg total) by mouth every 6 (six) hours. 08/04/20   Liberty Handy, PA-C  Lurasidone HCl (LATUDA) 60 MG TABS Take by mouth.    [provider]  methylPREDNISolone (MEDROL DOSEPAK) 4 MG TBPK tablet Follow package insert 08/10/21   Hyman Hopes, Margaux, PA-C   penicillin v potassium (VEETID) 500 MG tablet Take 1 tablet (500 mg total) by mouth 3 (three) times daily. 05/24/19   Mardella Layman, MD  permethrin (ELIMITE) 5 % cream Apply to affected area once 08/04/20   Liberty Handy, PA-C  traMADol (ULTRAM) 50 MG tablet Take 1 tablet (50 mg total) by mouth every 6 (six) hours as needed. 05/25/19   Eustace Moore, MD     Critical care time: 50 min     CRITICAL CARE Performed by: Lanier Clam   Total critical care time: 50 minutes  Critical care time was exclusive of separately billable procedures and treating other patients. Critical care was necessary to treat or prevent imminent or life-threatening deterioration.  Critical care was time spent personally by me on the following activities: development of treatment plan with patient and/or surrogate as well as nursing, discussions with consultants, evaluation of patient's response to treatment, examination of patient, obtaining history from patient or surrogate, ordering and performing treatments and interventions, ordering and review of laboratory studies, ordering and review of radiographic studies, pulse oximetry and re-evaluation of patient's condition.  Tessie Fass MSN, AGACNP-BC [facility] Amion for pager  02/04/2022, 1:56 PM

## 2022-02-04 NOTE — Progress Notes (Addendum)
Pharmacy Antibiotic Note  Paula Lucas is a 31 y.o. female admitted on 02/04/2022 with  abdominal pain .  Pharmacy has been consulted for zosyn dosing. Pt is febrile with Tmax 102.6 and WBC is WNL. SCr is elevated above baseline at 1.52. Pt is ~[redacted] weeks pregnant.   Plan: Zosyn 3.375g IV q8h (4 hour infusion). F/u renal fxn, C&S, clinical status  Height: 5\' 3"  (160 cm) Weight: 77.1 kg (169 lb 14.4 oz) IBW/kg (Calculated) : 52.4  Temp (24hrs), Avg:100.2 F (37.9 C), Min:97.7 F (36.5 C), Max:102.6 F (39.2 C)  Recent Labs  Lab 02/04/22 0901  WBC 11.3*  10.8*  CREATININE 1.52*    Estimated Creatinine Clearance: 53.7 mL/min (A) (by C-G formula based on SCr of 1.52 mg/dL (H)).    Allergies  Allergen Reactions   Fish Allergy Anaphylaxis    Severe allergic reaction to all seafoods   Ibuprofen Anaphylaxis    Pt tolerated Ketorolac 04/02/18   Mushroom Extract Complex Anaphylaxis   Shellfish-Derived Products Anaphylaxis   Tylenol [Acetaminophen] Anaphylaxis    Antimicrobials this admission: Zosyn 6/15>>  Dose adjustments this admission: N/A  Microbiology results: Pending  Thank you for allowing pharmacy to be a part of this patient's care.  Enio Hornback, 06/02/18 02/04/2022 9:30 AM

## 2022-02-04 NOTE — ED Notes (Signed)
Difficulty getting second   IV by multiple  RN and  resp

## 2022-02-04 NOTE — Progress Notes (Addendum)
Pt transported to CT and back to 79m08 on etCO2 monitor without complication.

## 2022-02-04 NOTE — Progress Notes (Signed)
Notified bedside nurse of need to draw repeat lactic acid and give fluid bolus needed for required fluids.

## 2022-02-04 NOTE — ED Notes (Signed)
IV attempt x1 right forearm, unable to thread vein, patient previously attempted by staff.

## 2022-02-04 NOTE — Progress Notes (Addendum)
BP (!) 105/53 (BP Location: Left Arm)   Pulse 98   Temp 100.2 F (37.9 C) (Oral)   Resp 16   Ht 5\' 3"  (1.6 m)   Wt 77.1 kg   SpO2 97%   Breastfeeding Unknown   BMI 30.10 kg/m   Arouses to stimulation, normal speech, answering questions appropriately No sensation to touch below the knee, now reporting it is intact above the knee. Still able to wiggle toes.   MRI to be done when ETCO2 hooked up-- currently supplies to do this are on the unit per RN. NS paged - spoke to Dr. who will follow up on MRI when available.  Jake Samples, DO 02/04/22 6:56 PM [facility]

## 2022-02-04 NOTE — Progress Notes (Signed)
eLink Physician-Brief Progress Note Patient Name: Paula Lucas DOB: 01/11/1991 MRN: [id number]   Date of Service  02/04/2022  HPI/Events of Note  Detailed neurologist exam carried out, he does not find  a neurologic syndrome based etiology of the patients symptoms and recommends against MRI at this time.  eICU Interventions  MRI order discontinued.        Thomasene Lot Khamya Topp 02/04/2022, 8:59 PM

## 2022-02-04 NOTE — ED Notes (Signed)
Attempted IV access to LFA, unsuccessful

## 2022-02-04 NOTE — Progress Notes (Signed)
Elink following code sepsis °

## 2022-02-04 NOTE — ED Notes (Signed)
Attempted IV access in right hand, blood return noted, IV infiltrated

## 2022-02-04 NOTE — ED Notes (Signed)
ED Provider at bedside. 

## 2022-02-04 NOTE — Progress Notes (Addendum)
Patient refusing imaging, says we have to put her to sleep to do it. Refused to move to the MRI table even if we gave additional IV pain meds, which the RN brought to MRI. We discussed how important these scans are to her care and that heavy sedation, intubation, need for MV are associated with potential negative outcomes and should be reserved for when they are absolutely needed. She understands that intubation can be associated with complications up to and including death if there were difficulty obtaining her airway. She consented and repeated that the only way we can get scans is if she is put to sleep. Gyn coming to get a history, then planning for intubation to facilitate her care.  Steffanie Dunn, DO 02/04/22 4:04 PM [facility]   Patient now agreeable to trying to avoid intubation for imaging after OB-Gyn's discussion with her during consultation. Additional pain control ordered and will try versed. ETCO2 ordered.  Steffanie Dunn, DO 02/04/22 4:27 PM [facility]

## 2022-02-04 NOTE — ED Notes (Signed)
Dr Doreatha Martin  at bedside for 2 nd IV

## 2022-02-04 NOTE — Progress Notes (Signed)
Patient seen and examined-- exam limited by pain. Pain meds ordered. Abd CT ordered. Due to reported numbness, MRI L-spine and neurology consult ordered. STAT BMP. D/w RN & patient. L/M to update patient's mother.   Full consult note to follow.  Steffanie Dunn, DO 02/04/22 1:54 PM [facility]

## 2022-02-04 NOTE — Progress Notes (Signed)
RN concerned about ability to control pain for CT scan. Additional 1-time dose of dilaudid ordered.  BP (!) 105/53 (BP Location: Left Arm)   Pulse 98   Temp (!) 101.6 F (38.7 C) (Oral)   Resp 16   Ht 5\' 3"  (1.6 m)   Wt 77.1 kg   SpO2 97%   Breastfeeding Unknown   BMI 30.10 kg/m   Patient re-examined at bedside. Sleeping comfortably, aroused to verbal stimulation, fell back asleep. Ongoing tachypnea but no tachycardia, diaphoresis. She remains on RA.  We discussed the importance of getting her scans to help better care for her, including make decisions regarding procedures. She reports understanding and did not indicate that she is currently unwilling to get a CT scan, which is scheduled for 3:30.  , DO 02/04/22 2:49 PM [facility]

## 2022-02-04 NOTE — Progress Notes (Addendum)
Notified by RN that she has been moved down the MRI priority list since she refused her scan previously. Due to neuro deficits and concern for infection causing those deficits, this is a priority scan. Neurology agrees. I spoke to MRI who said they need to speak with neurology to re-prioritize this scan. Neuro has been re-paged to help with coordinating her care. D/w Neuro, who will call MRI. Recommended consulting Neurosurgery before MRI.  Additional CC time: 40 min.  Steffanie Dunn, DO 02/04/22 6:10 PM [facility]

## 2022-02-04 NOTE — Progress Notes (Signed)
Pharmacy Antibiotic Note  Paula Lucas is a 31 y.o. female admitted on 02/04/2022 with  abdominal pain .  Pharmacy has been consulted for zosyn dosing. Pt is febrile with Tmax 102.6 and WBC is WNL. SCr is elevated above baseline at 1.52. Pt is ~[redacted] weeks pregnant.   Abx are changed to ceftriaxone/flagyl to cover for PID. She has received 1 dose of IV azithromycin.  Plan: Dc zosyn Ceftriaxone 2g IV q24  Height: 5\' 3"  (160 cm) Weight: 77.1 kg (169 lb 14.4 oz) IBW/kg (Calculated) : 52.4  Temp (24hrs), Avg:100.5 F (38.1 C), Min:97.7 F (36.5 C), Max:102.6 F (39.2 C)  Recent Labs  Lab 02/04/22 0901 02/04/22 1143 02/04/22 1453  WBC 11.3*  10.8*  --   --   CREATININE 1.52*  --  1.03*  LATICACIDVEN 2.7* 1.3  --      Estimated Creatinine Clearance: 79.3 mL/min (A) (by C-G formula based on SCr of 1.03 mg/dL (H)).    Allergies  Allergen Reactions   Fish Allergy Anaphylaxis    Severe allergic reaction to all seafoods   Motrin [Ibuprofen] Anaphylaxis    Pt tolerated Ketorolac 04/02/18   Mushroom Extract Complex Anaphylaxis   Shellfish-Derived Products Anaphylaxis   Tylenol [Acetaminophen] Anaphylaxis    Antimicrobials this admission: Zosyn 6/15>>6/15 Azith 6/15 x1 Ceftriaxone 6/15>> Flagyl 6/15>>  Dose adjustments this admission: N/A  Microbiology results: 6/15 blood>> 6/15 urine>>   7/15, PharmD, [place], AAHIVP, CPP Infectious Disease Pharmacist 02/04/2022 5:08 PM

## 2022-02-04 NOTE — ED Provider Notes (Addendum)
[facility] Provider Note   CSN: [id number] Arrival date & time: 02/04/22  0841     History  Chief Complaint  Patient presents with   Abdominal Pain    Pregnant [redacted] Weeks    Paula Lucas is a 31 y.o. female.  HPI     31 year old G2, P24 female comes in with chief complaint of abdominal pain.  Patient indicates that she is [redacted] weeks pregnant by date, has not established OB care with this pregnancy.  Her first child is 12 months old.  She indicates that she woke up yesterday with excruciating pain.  She had some vaginal bleeding 3 days ago, did not seek emergency care at that time.  Pain is primarily in the lower quadrants, but generalized as well.  Yesterday she started feeling cold and sweaty.  Her pain was intense from the beginning.  She had associated nausea, vomiting and went to the ER last night.  It appears that patient left AGAINST MEDICAL ADVICE from triage.   Patient denies any active vaginal bleeding or discharge.  She does have some discomfort with urination, described as " my insides are falling out"  Home Medications Prior to Admission medications   Medication Sig Start Date End Date Taking? Authorizing Provider  doxycycline (VIBRAMYCIN) 100 MG capsule Take 1 capsule (100 mg total) by mouth 2 (two) times daily. 08/04/20   Kinnie Feil, PA-C  hydrOXYzine (ATARAX/VISTARIL) 25 MG tablet Take 1 tablet (25 mg total) by mouth every 6 (six) hours. 08/04/20   Kinnie Feil, PA-C  Lurasidone HCl (LATUDA) 60 MG TABS Take by mouth.    [provider]  methylPREDNISolone (MEDROL DOSEPAK) 4 MG TBPK tablet Follow package insert 08/10/21   Alroy Bailiff, Margaux, PA-C  penicillin v potassium (VEETID) 500 MG tablet Take 1 tablet (500 mg total) by mouth 3 (three) times daily. 05/24/19   Vanessa Kick, MD  permethrin (ELIMITE) 5 % cream Apply to affected area once 08/04/20   Kinnie Feil, PA-C  traMADol (ULTRAM) 50 MG tablet Take 1  tablet (50 mg total) by mouth every 6 (six) hours as needed. 05/25/19   Raylene Everts, MD      Allergies    Fish allergy, Ibuprofen, Mushroom extract complex, Shellfish-derived products, and Tylenol [acetaminophen]    Review of Systems   Review of Systems  All other systems reviewed and are negative.   Physical Exam Updated Vital Signs BP 93/63   Pulse 100   Temp (!) 101.6 F (38.7 C) (Oral)   Resp 16   Ht 5\' 3"  (1.6 m)   Wt 77.1 kg   SpO2 97%   Breastfeeding Unknown   BMI 30.10 kg/m  Physical Exam Vitals and nursing note reviewed.  Constitutional:      General: She is in acute distress.     Comments: Distress secondary to pain  HENT:     Head: Atraumatic.  Cardiovascular:     Rate and Rhythm: Tachycardia present.  Pulmonary:     Effort: Pulmonary effort is normal.  Abdominal:     Tenderness: There is generalized abdominal tenderness. There is guarding.     Comments: Patient not fully cooperative with abdominal exam secondary to pain and discomfort.  She is having diffuse tenderness that is worse over the lower quadrant, guarding all over.  Genitourinary:    Comments: Patient has vaginal discharge, cervical motion tenderness, no clear adnexal fullness Musculoskeletal:     Cervical back: Normal range of  motion and neck supple.  Skin:    General: Skin is warm and dry.  Neurological:     Mental Status: She is alert and oriented to person, place, and time.     ED Results / Procedures / Treatments   Labs (all labs ordered are listed, but only abnormal results are displayed) Labs Reviewed  WET PREP, GENITAL - Abnormal; Notable for the following components:      Result Value   Clue Cells Wet Prep HPF POC PRESENT (*)    WBC, Wet Prep HPF POC >=10 (*)    All other components within normal limits  COMPREHENSIVE METABOLIC PANEL - Abnormal; Notable for the following components:   Sodium 134 (*)    Glucose, Bld 119 (*)    Creatinine, Ser 1.52 (*)    Calcium 8.5  (*)    Albumin 2.7 (*)    AST 60 (*)    ALT 78 (*)    Total Bilirubin 1.8 (*)    GFR, Estimated 47 (*)    All other components within normal limits  CBC - Abnormal; Notable for the following components:   WBC 10.8 (*)    RBC 3.79 (*)    Hemoglobin 11.0 (*)    HCT 33.5 (*)    Platelets 617 (*)    All other components within normal limits  URINALYSIS, ROUTINE W REFLEX MICROSCOPIC - Abnormal; Notable for the following components:   Color, Urine AMBER (*)    APPearance HAZY (*)    Bilirubin Urine MODERATE (*)    Protein, ur 30 (*)    All other components within normal limits  LACTIC ACID, PLASMA - Abnormal; Notable for the following components:   Lactic Acid, Venous 2.7 (*)    All other components within normal limits  CBC WITH DIFFERENTIAL/PLATELET - Abnormal; Notable for the following components:   WBC 11.3 (*)    RBC 3.79 (*)    Hemoglobin 10.9 (*)    HCT 33.4 (*)    Platelets 621 (*)    Neutro Abs 8.8 (*)    All other components within normal limits  URINALYSIS, MICROSCOPIC (REFLEX) - Abnormal; Notable for the following components:   Bacteria, UA FEW (*)    All other components within normal limits  RESP PANEL BY RT-PCR (FLU A&B, COVID) ARPGX2  CULTURE, BLOOD (ROUTINE X 2)  CULTURE, BLOOD (ROUTINE X 2)  URINE CULTURE  LIPASE, BLOOD  HCG, SERUM, QUALITATIVE  LACTIC ACID, PLASMA  PROTIME-INR  APTT  HCG, QUANTITATIVE, PREGNANCY  GC/CHLAMYDIA PROBE AMP ([place]) NOT AT [facility]    EKG None  Radiology US PELVIC COMPLETE WITH TRANSVAGINAL  Result Date: 02/04/2022 CLINICAL DATA:  A 31 year old female presents with pelvic pain and bleeding. Patient reports positive pregnancy but urine pregnancy currently negative by report and beta HCG is currently pending. EXAM: TRANSABDOMINAL AND TRANSVAGINAL ULTRASOUND OF PELVIS TECHNIQUE: Both transabdominal and transvaginal ultrasound examinations of the pelvis were performed. Transabdominal technique was performed for global imaging  of the pelvis including uterus, ovaries, adnexal regions, and pelvic cul-de-sac. It was necessary to proceed with endovaginal exam following the transabdominal exam to visualize the endometrium and adnexa. COMPARISON:  No recent imaging is available for comparison. FINDINGS: Uterus Measurements: 12.6 x 8.0 x 8.5 (volume = 450 cc) cm. = volume: 450 mL. Heterogeneous uterus with thickened endometrium. Cystic area in the endometrial canal that measures 4.5 x 2.6 x 4.4 cm. This cystic characteristics and central increased echogenicity. Central increased echogenicity the without discrete finding  that would suggest embryo with very limited assessment due to patient discomfort and clinical condition. Endometrium Thickness: 3.3 cm. Thickened with cystic area in the fundus of the uterus. Right ovary Measurements: 3.2 x 2.7 x 2.1 (volume = 9.5 cc) cm = volume: 9.5 mL. Normal appearance/no adnexal mass. Left ovary Not visible, in the area of the LEFT adnexa is heterogeneous material that has the appearance of blood products but conforms to the cul-de-sac and LEFT adnexa. Other findings Moderate heterogeneous fluid in the LEFT adnexa primarily but also tracking along the RIGHT uterus. IMPRESSION: Unusual cystic area in the endometrial canal. If this patient has any history of recent positive pregnancy test this could represent a failed gestation with retained products of conception. Pseudo gestational sac with areas of internal hemorrhage in the setting of ruptured ectopic pregnancy is also considered. Heterogeneous appearance of material in the cul-de-sac could also potentially represent sequela of pyogenic infection though this is lower on the differential at this time based on appearance. If the patient could tolerate further evaluation with cross-sectional imaging CT could potentially be helpful. Ultimately gyn consultation is suggested. Critical Value/emergent results were called by telephone at the time of interpretation  on 02/04/2022 at 11:01 am to provider [facility] , who verbally acknowledged these results. Electronically Signed   By: Donzetta Kohut M.D.   On: 02/04/2022 11:01   DG Chest Port 1 View  Result Date: 02/04/2022 CLINICAL DATA:  31 year old female with history of lower abdominal pain and vaginal bleeding 3 days ago. Fever. Low blood pressure. EXAM: PORTABLE CHEST 1 VIEW COMPARISON:  Chest x-ray 08/09/2021. FINDINGS: Lung volumes are slightly low. No consolidative airspace disease. No pleural effusions. No pneumothorax. No evidence of pulmonary edema. Heart size is normal. The patient is rotated to the right on today's exam, resulting in distortion of the mediastinal contours and reduced diagnostic sensitivity and specificity for mediastinal pathology. IMPRESSION: 1. No radiographic evidence of acute cardiopulmonary disease. Electronically Signed   By: Trudie Reed M.D.   On: 02/04/2022 10:55    Procedures .Critical Care  Performed by: Derwood Kaplan, MD Authorized by: Derwood Kaplan, MD   Critical care provider statement:    Critical care time (minutes):  92   Critical care was necessary to treat or prevent imminent or life-threatening deterioration of the following conditions:  Sepsis and circulatory failure   Critical care was time spent personally by me on the following activities:  Development of treatment plan with patient or surrogate, discussions with consultants, evaluation of patient's response to treatment, examination of patient, ordering and review of laboratory studies, ordering and review of radiographic studies, ordering and performing treatments and interventions, pulse oximetry, re-evaluation of patient's condition and review of old charts Angiocath insertion  Date/Time: 02/04/2022 11:52 AM  Performed by: Derwood Kaplan, MD Authorized by: Derwood Kaplan, MD  Consent: Verbal consent obtained. Risks and benefits: risks, benefits and alternatives were discussed Consent  given by: patient Patient identity confirmed: arm band Time out: Immediately prior to procedure a "time out" was called to verify the correct patient, procedure, equipment, support staff and site/side marked as required. Preparation: Patient was prepped and draped in the usual sterile fashion. Local anesthesia used: no  Anesthesia: Local anesthesia used: no  Sedation: Patient sedated: no  Patient tolerance: patient tolerated the procedure well with no immediate complications       Medications Ordered in ED Medications  lactated ringers infusion ( Intravenous New Bag/Given 02/04/22 1208)  piperacillin-tazobactam (ZOSYN) IVPB 3.375 g (  has no administration in time range)  phenylephrine 80 mcg/10 mL injection (has no administration in time range)  0.9 %  sodium chloride infusion (has no administration in time range)  norepinephrine (LEVOPHED) 4mg  in 238mL (0.016 mg/mL) premix infusion (2 mcg/min Intravenous New Bag/Given 02/04/22 1208)  lactated ringers bolus 1,000 mL (1,000 mLs Intravenous New Bag/Given 02/04/22 0940)    And  lactated ringers bolus 1,000 mL (1,000 mLs Intravenous New Bag/Given 02/04/22 0959)    And  lactated ringers bolus 500 mL (500 mLs Intravenous New Bag/Given 02/04/22 1106)  fentaNYL (SUBLIMAZE) injection 50 mcg (50 mcg Intravenous Given 02/04/22 0954)  piperacillin-tazobactam (ZOSYN) IVPB 3.375 g (3.375 g Intravenous New Bag/Given 02/04/22 0943)  ondansetron (ZOFRAN) injection 4 mg (4 mg Intravenous Given 02/04/22 1019)  ketamine (KETALAR) injection 12 mg (12 mg Intravenous Given 02/04/22 1045)  PHENYLephrine 80 mcg/ml in normal saline Adult IV Push Syringe (For Blood Pressure Support) (100 mcg Intravenous Given 02/04/22 1205)  fentaNYL (SUBLIMAZE) injection 50 mcg (50 mcg Intravenous Given 02/04/22 1200)    ED Course/ Medical Decision Making/ A&P Clinical Course as of 02/04/22 1309  Thu Feb 04, 2022  0936 Patient has received IV fentanyl.  Ultrasound tech indicated  to me that patient is declining to lay on her back, declining to allow transvaginal evaluation.  I have gently asked patient again to cooperate, so that we know the root cause for her pain.  At this time, patient has agreed to transabdominal ultrasound only. [AN]  1045 I spoke with Gyne - Dr. Nelda Marseille, discussed presentation, previous hx, current exam finding and equivocal ultrasound.  She reviewed the ultrasound with me.  We discussed patient's bleeding 3 days ago, but negative pregnancy test now.   Given her low blood pressure, equivocal ultrasound, differential is still wide.  But she agrees that patient will need to be seen primarily by Gyne.  However, secondary to her low blood pressure, they request ICU admission. [AN]  1109 Discussed case with Dr. Tamala Julian, critical care.  They have accepted the patient.  Patient has received IV ketamine for pain.  Her BP has improved.  Heart rate has come down a little bit as well.  Midway through attempting ultrasound-guided IV, patient started resisting it and declining it.  She has now received 2 L of IV fluid.  We will reassess for IV in few minutes when she no longer is under the influence of ketamine. [AN]  1109 US PELVIC COMPLETE WITH TRANSVAGINAL Ultrasound visualized independently.  On my interpretation, I can see free fluid in the pelvis and intrauterine mass that is uncertain.  This concern was discussed with gynecology attending.  I also discussed the case with radiology, they are also little bit puzzled with the equivocal finding.  There is no clear embryo evidence.  There differential includes ruptured ectopic, purulence from TOA, intrauterine blood. [AN]  1111 I have confirmed with the [facility] team that patient will get an ICU bed immediately.  We will direct admit rather than ED to ED transfer. [AN]  1151 Patient's BP started dropping again.  I have secured another 20-gauge IV in her right forearm.  We will give her 100 mcg IV phenylephrine now.  Nor  epi will be initiated. [AN]  1200 Sepsis reassessment completed - pt stable for transfer.  [AN]    Clinical Course User Index [AN] Varney Biles, MD  Medical Decision Making Amount and/or Complexity of Data Reviewed Labs: ordered. Radiology: ordered. Decision-making details documented in ED Course. ECG/medicine tests: ordered.  Risk Prescription drug management. Decision regarding hospitalization.   This patient presents to the ED with chief complaint(s) of lower quadrant abdominal pain with pertinent past medical history of G2 P1, allegedly [redacted] weeks pregnant which further complicates the presenting complaint. The complaint involves an extensive differential diagnosis and also carries with it a high risk of complications and morbidity.    The differential diagnosis includes : Pelvic inflammatory disease, tubo-ovarian abscess, toxic shock syndrome, ovarian torsion, ruptured appendicitis, ectopic pregnancy, UTI, cholecystitis, perforated viscus, intra-amniotic infection.  Patient noted hypotensive.  She is febrile, tachycardic, tachypneic. Initial plan is to start sepsis work-up, start antibiotics.  Ultrasound pelvis, pregnancy test ordered.  Pelvic exam ordered.  Patient in severe pain, requesting pain medication before we can consider pelvic exam or abdominal exam.  She understands that fentanyl will cross into placenta, will impact the fetus, and she wants something for pain.  She has history of Tylenol allergy, therefore we will not give her Tylenol at this time.  30 cc per of IVF ordered.   Additional history obtained: Records reviewed previous admission documents and Care Everywhere/External Records  Independent labs interpretation:  The following labs were independently interpreted: White count is slightly elevated at 11.  Hemoglobin is 11, no baseline for comparison.  Patient's creatinine is slightly elevated compared to baseline at 1.5  for  Independent visualization of imaging: - I independently visualized the following imaging with scope of interpretation limited to determining acute life threatening conditions related to emergency care: Ultrasound of the pelvis -my interpretation as above.  Treatment and Reassessment: Patient reassessed multiple times.  Consultation: - Consulted or discussed management/test interpretation w/ external professional: Gynecology, critical care service.  Gynecology team will be on board for consultation, critical care will direct admit.   Final Clinical Impression(s) / ED Diagnoses Final diagnoses:  Septic shock [facility])    Rx / DC Orders ED Discharge Orders     None          Varney Biles, MD 02/04/22 1309

## 2022-02-04 NOTE — ED Notes (Signed)
NS bolus started  prior to getting orders for LR

## 2022-02-04 NOTE — ED Triage Notes (Signed)
Lower abdominal pain, [facility], left AMA.   States had vaginal bleeding 3 days ago. [redacted] weeks pregnant, G2P1.

## 2022-02-04 NOTE — ED Notes (Signed)
States hasnt seen OBGYN yet for this pregnancy. States hasnt taken antihypertensives in 3 weeks, ran out of meds. Hx hypertension during last pregnancy

## 2022-02-05 ENCOUNTER — Inpatient Hospital Stay (HOSPITAL_COMMUNITY): Payer: Medicaid Other

## 2022-02-05 ENCOUNTER — Other Ambulatory Visit: Payer: Self-pay

## 2022-02-05 ENCOUNTER — Encounter (HOSPITAL_COMMUNITY): Payer: Self-pay | Admitting: Internal Medicine

## 2022-02-05 DIAGNOSIS — N739 Female pelvic inflammatory disease, unspecified: Secondary | ICD-10-CM

## 2022-02-05 LAB — COMPREHENSIVE METABOLIC PANEL
ALT: 48 U/L — ABNORMAL HIGH (ref 0–44)
AST: 28 U/L (ref 15–41)
Albumin: 2 g/dL — ABNORMAL LOW (ref 3.5–5.0)
Alkaline Phosphatase: 74 U/L (ref 38–126)
Anion gap: 12 (ref 5–15)
BUN: 7 mg/dL (ref 6–20)
CO2: 22 mmol/L (ref 22–32)
Calcium: 7.9 mg/dL — ABNORMAL LOW (ref 8.9–10.3)
Chloride: 100 mmol/L (ref 98–111)
Creatinine, Ser: 1.16 mg/dL — ABNORMAL HIGH (ref 0.44–1.00)
GFR, Estimated: >60 mL/min (ref >60)
Glucose, Bld: 126 mg/dL — ABNORMAL HIGH (ref 70–99)
Potassium: 3.8 mmol/L (ref 3.5–5.1)
Sodium: 134 mmol/L — ABNORMAL LOW (ref 135–145)
Total Bilirubin: 1.2 mg/dL (ref 0.3–1.2)
Total Protein: 6.6 g/dL (ref 6.5–8.1)

## 2022-02-05 LAB — GLUCOSE, CAPILLARY
Glucose-Capillary: 103 mg/dL — ABNORMAL HIGH (ref 70–99)
Glucose-Capillary: 118 mg/dL — ABNORMAL HIGH (ref 70–99)
Glucose-Capillary: 119 mg/dL — ABNORMAL HIGH (ref 70–99)
Glucose-Capillary: 127 mg/dL — ABNORMAL HIGH (ref 70–99)
Glucose-Capillary: 130 mg/dL — ABNORMAL HIGH (ref 70–99)
Glucose-Capillary: 132 mg/dL — ABNORMAL HIGH (ref 70–99)

## 2022-02-05 LAB — CBC
HCT: 34.2 % — ABNORMAL LOW (ref 36.0–46.0)
Hemoglobin: 11.2 g/dL — ABNORMAL LOW (ref 12.0–15.0)
MCH: 28.7 pg (ref 26.0–34.0)
MCHC: 32.7 g/dL (ref 30.0–36.0)
MCV: 87.7 fL (ref 80.0–100.0)
Platelets: 563 10*3/uL — ABNORMAL HIGH (ref 150–400)
RBC: 3.9 MIL/uL (ref 3.87–5.11)
RDW: 15 % (ref 11.5–15.5)
WBC: 16.9 10*3/uL — ABNORMAL HIGH (ref 4.0–10.5)
nRBC: 0 % (ref 0.0–0.2)

## 2022-02-05 LAB — GC/CHLAMYDIA PROBE AMP (~~LOC~~) NOT AT ARMC
Chlamydia: NEGATIVE
Comment: NEGATIVE
Comment: NORMAL
Neisseria Gonorrhea: NEGATIVE

## 2022-02-05 LAB — HEMOGLOBIN A1C
Hgb A1c MFr Bld: 5.2 % (ref 4.8–5.6)
Mean Plasma Glucose: 102.54 mg/dL

## 2022-02-05 LAB — MAGNESIUM: Magnesium: 1.8 mg/dL (ref 1.7–2.4)

## 2022-02-05 LAB — PHOSPHORUS: Phosphorus: 3.3 mg/dL (ref 2.5–4.6)

## 2022-02-05 LAB — HIV ANTIBODY (ROUTINE TESTING W REFLEX): HIV Screen 4th Generation wRfx: NONREACTIVE

## 2022-02-05 MED ORDER — FENTANYL CITRATE (PF) 100 MCG/2ML IJ SOLN
INTRAMUSCULAR | Status: AC
  Filled 2022-02-05: qty 2

## 2022-02-05 MED ORDER — HYDROMORPHONE HCL 1 MG/ML IJ SOLN
INTRAMUSCULAR | Status: DC | PRN
  Administered 2022-02-05: 1 mg via INTRAVENOUS

## 2022-02-05 MED ORDER — MIDAZOLAM HCL 2 MG/2ML IJ SOLN
INTRAMUSCULAR | Status: DC | PRN
  Administered 2022-02-05: .5 mg via INTRAVENOUS
  Administered 2022-02-05: 1 mg via INTRAVENOUS
  Administered 2022-02-05: .5 mg via INTRAVENOUS
  Administered 2022-02-05: 1 mg via INTRAVENOUS

## 2022-02-05 MED ORDER — MAGNESIUM SULFATE 2 GM/50ML IV SOLN
2.0000 g | Freq: Once | INTRAVENOUS | Status: AC
  Administered 2022-02-05: 2 g via INTRAVENOUS
  Filled 2022-02-05: qty 50

## 2022-02-05 MED ORDER — MIDAZOLAM HCL 2 MG/2ML IJ SOLN
INTRAMUSCULAR | Status: AC
  Filled 2022-02-05: qty 4

## 2022-02-05 MED ORDER — HYDROMORPHONE HCL 1 MG/ML IJ SOLN
INTRAMUSCULAR | Status: AC
  Filled 2022-02-05: qty 1

## 2022-02-05 MED ORDER — FENTANYL CITRATE (PF) 100 MCG/2ML IJ SOLN
INTRAMUSCULAR | Status: DC | PRN
  Administered 2022-02-05 (×3): 25 ug via INTRAVENOUS

## 2022-02-05 MED ORDER — LIDOCAINE HCL 1 % IJ SOLN
INTRAMUSCULAR | Status: AC
  Filled 2022-02-05: qty 10

## 2022-02-05 MED ORDER — SODIUM CHLORIDE 0.9% FLUSH
5.0000 mL | Freq: Three times a day (TID) | INTRAVENOUS | Status: DC
  Administered 2022-02-05 – 2022-02-06 (×5): 5 mL

## 2022-02-05 NOTE — Plan of Care (Signed)
  Problem: Education: Goal: Knowledge of General Education information will improve Description: Including pain rating scale, medication(s)/side effects and non-pharmacologic comfort measures Outcome: Progressing   Problem: Clinical Measurements: Goal: Respiratory complications will improve Outcome: Progressing   Problem: Activity: Goal: Risk for activity intolerance will decrease Outcome: Progressing   Problem: Coping: Goal: Level of anxiety will decrease Outcome: Progressing   Problem: Safety: Goal: Ability to remain free from injury will improve Outcome: Progressing   Problem: Skin Integrity: Goal: Risk for impaired skin integrity will decrease Outcome: Progressing   

## 2022-02-05 NOTE — Consult Note (Signed)
NEURO HOSPITALIST CONSULT NOTE   Requestig physician: Dr. Chestine Spore  Reason for Consult: Bilateral lower extremity numbness from knees to ankles in a circumferential distribution  History obtained from:   Patient and Chart     HPI:                                                                                                                                          Paula Lucas is an 31 y.o. female with a PMHx of chlamydia, gonorrhea, tubo-ovarian abscess, asthma and HTN who re-presented to the hospital with lower abdominal pain and a 3 day history of vaginal bleeding on 6/15. She had been evaluated the night before, but had left AMA. Initially thought to be pregnant, her pregnancy test here in the ED was negative, possibly due to spontaneous abortion given the 3 day history of passing clots vaginally that was endorsed by the patient. A transvaginal ultrasound was concerning for free fluid in the pelvis and an intrauterine mass. Initial concerns regarding her presentation were septic shock, PID and possible endometritis with recent incomplete abortion. UTI or other intraabdominal pathology were also considerations. She was hypotensive in the ED despite 2L IVF and was started on NE as well as empiric pip/tazo. While being evaluated by CCM, she stated that her legs were numb. Given concern for possible pelvic abscess or mass lesion resulting in the numbness, CCM consulted Neurology and ordered an MRI of her lumbar spine.   Past Medical History:  Diagnosis Date   Asthma    BMI 31.0-31.9,adult    Chlamydia    Gonorrhea    TOA (tubo-ovarian abscess)     Past Surgical History:  Procedure Laterality Date   INDUCED ABORTION     IR RADIOLOGIST EVAL & MGMT  05/17/2018   TONSILLECTOMY      Family History  Problem Relation Age of Onset   Diabetes Mother    Hypertension Mother    Diabetes Father    Hypertension Father    Diabetes Sister    Hypertension Sister                Social History:  reports that she has been smoking cigarettes. She has a 4.50 pack-year smoking history. She has never used smokeless tobacco. She reports current drug use. Drug: Marijuana. She reports that she does not drink alcohol.  Allergies  Allergen Reactions   Fish Allergy Anaphylaxis    Severe allergic reaction to all seafoods   Motrin [Ibuprofen] Anaphylaxis    Pt tolerated Ketorolac 04/02/18   Mushroom Extract Complex Anaphylaxis   Shellfish-Derived Products Anaphylaxis   Tylenol [Acetaminophen] Anaphylaxis    MEDICATIONS:  Prior to Admission:  Medications Prior to Admission  Medication Sig Dispense Refill Last Dose   doxycycline (VIBRAMYCIN) 100 MG capsule Take 1 capsule (100 mg total) by mouth 2 (two) times daily. (Patient not taking: Reported on 02/04/2022) 20 capsule 0 Not Taking   hydrOXYzine (ATARAX/VISTARIL) 25 MG tablet Take 1 tablet (25 mg total) by mouth every 6 (six) hours. (Patient not taking: Reported on 02/04/2022) 12 tablet 0 Not Taking   methylPREDNISolone (MEDROL DOSEPAK) 4 MG TBPK tablet Follow package insert (Patient not taking: Reported on 02/04/2022) 21 each 0 Not Taking   penicillin v potassium (VEETID) 500 MG tablet Take 1 tablet (500 mg total) by mouth 3 (three) times daily. (Patient not taking: Reported on 02/04/2022) 30 tablet 0 Not Taking   permethrin (ELIMITE) 5 % cream Apply to affected area once (Patient not taking: Reported on 02/04/2022) 60 g 0 Not Taking   traMADol (ULTRAM) 50 MG tablet Take 1 tablet (50 mg total) by mouth every 6 (six) hours as needed. (Patient not taking: Reported on 02/04/2022) 10 tablet 0 Not Taking   Scheduled:  Chlorhexidine Gluconate Cloth  6 each Topical Daily   doxycycline  100 mg Oral Q12H   fentaNYL (SUBLIMAZE) injection  100 mcg Intravenous Once   heparin  5,000 Units Subcutaneous Q8H    HYDROmorphone  (DILAUDID) injection  1 mg Intravenous Once   insulin aspart  2-6 Units Subcutaneous Q4H   ketamine (KETALAR) injection 10mg /mL (IV use)  1 mg/kg Intravenous Once   rocuronium  1 mg/kg Intravenous Once   Continuous:  sodium chloride Stopped (02/04/22 1522)   cefTRIAXone (ROCEPHIN)  IV Stopped (02/04/22 2011)   lactated ringers Stopped (02/04/22 1503)   metronidazole Stopped (02/04/22 1917)   norepinephrine (LEVOPHED) Adult infusion 10 mcg/min (02/05/22 0200)     ROS:                                                                                                                                       Complains of severe abdominal/pelvic pain that limits the exam. States that her only neurological symptom is her BLE numbness below the knees. There is no associated symptomatic limb weakness, confusion, aphasia, saddle anesthesia, ataxia, dysarthria, CP, H/A, urinary incontinence, bowel incontinence or facial droop.   Blood pressure (!) 90/49, pulse (!) 107, temperature (!) 102.7 F (39.3 C), temperature source Oral, resp. rate 19, height 5\' 3"  (1.6 m), weight 77.1 kg, SpO2 98 %, unknown if currently breastfeeding.   General Examination:  Physical Exam  HEENT-  [place]/AT    Lungs- Respirations unlabored Abdomen- Patient declines abdominal exam.  Extremities- No edema  Neurological Examination Mental Status: Alert, oriented x 5. Mood varies from dysthymic to cheerful and is somewhat labile in that regard. Somewhat odd, childlike affect at times. Speech fluent without evidence of aphasia.  Able to follow all commands without difficulty.  Cranial Nerves: II: Temporal visual fields intact with no extinction to DSS. PERRL  III,IV, VI: No ptosis. EOMI.  V: Temp sensation equal bilaterally  VII: Smile symmetric VIII: Hearing intact to voice IX,X: No hoarseness XI: Symmetric shoulder shrug XII:  Midline tongue extension Motor: BUE 5/5 proximally and distally BLE can only be partially assessed as she states that her abdominal pain is exacerbated by leg movements. ADF/APF 5/5, knee extension 4+/5 with poor effort due to pain, knee flexion, hip flexion and hip extension are 4/5 symmetrically, with poor effort due to pain.  Sensory: Temp and light touch intact to bilateral upper extremities and thighs. There is subjective severe/total apparent sensory loss in a circumferential distribution starting just below her knees and extending to about 2 cm above her ankles bilaterally in a "footless knee-high stockings" distribution. Intact sensation to temp, FT and pressure when testing feet along dorsal and plantar aspects.  Deep Tendon Reflexes: 2+ and symmetric brachioradialis, biceps, patellae and achilles. Toes downgoing bilaterally Cerebellar: No ataxia with FNF bilaterally  Gait: Deferred   Lab Results: Basic Metabolic Panel: Recent Labs  Lab 02/04/22 0901 02/04/22 1453  NA 134* 137  K 4.6 4.6  CL 102 106  CO2 22 19*  GLUCOSE 119* 128*  BUN 16 9  CREATININE 1.52* 1.03*  CALCIUM 8.5* 8.3*    CBC: Recent Labs  Lab 02/04/22 0901  WBC 11.3*  10.8*  NEUTROABS 8.8*  HGB 10.9*  11.0*  HCT 33.4*  33.5*  MCV 88.1  88.4  PLT 621*  617*    Cardiac Enzymes: No results for input(s): "CKTOTAL", "CKMB", "CKMBINDEX", "TROPONINI" in the last 168 hours.  Lipid Panel: No results for input(s): "CHOL", "TRIG", "HDL", "CHOLHDL", "VLDL", "LDLCALC" in the last 168 hours.  Imaging: CT ABDOMEN PELVIS WO CONTRAST  Result Date: 02/04/2022 CLINICAL DATA:  Abdominal pain, acute, nonlocalized septic shock, abnormal uterine US, concern for PID EXAM: CT ABDOMEN AND PELVIS WITHOUT CONTRAST TECHNIQUE: Multidetector CT imaging of the abdomen and pelvis was performed following the standard protocol without IV contrast. RADIATION DOSE REDUCTION: This exam was performed according to the departmental  dose-optimization program which includes automated exposure control, adjustment of the mA and/or kV according to patient size and/or use of iterative reconstruction technique. COMPARISON:  Pelvic ultrasound today FINDINGS: Lower chest: Pleural thickening noted peripherally in the right lower hemithorax, possibly representing trace loculated pleural effusion. No confluent airspace opacities. Hepatobiliary: No focal hepatic abnormality. Gallbladder unremarkable. Pancreas: No focal abnormality or ductal dilatation. Spleen: No focal abnormality.  Normal size. Adrenals/Urinary Tract: No adrenal abnormality. No focal renal abnormality. No stones or hydronephrosis. Urinary bladder is unremarkable. Stomach/Bowel: Wall thickening noted in the cecum which is immediately adjacent to the large pelvic fluid collection described below. This could reflect colitis or be secondarily inflamed. Stomach and small bowel grossly unremarkable. Vascular/Lymphatic: No evidence of aneurysm or adenopathy. Reproductive: Large complex cystic area or fluid collection seen in the pelvis extending from the cul-de-sac superiorly into the right lower quadrant adjacent to the cecum. This measures approximately 12 x 8 cm on image 64 of series 3. Internal areas of increased  density. This could reflect blood or abscess/PID. Uterus grossly unremarkable on this noncontrast study. Ovaries not definitively visualized. Other: There is free fluid adjacent to the liver and spleen. Musculoskeletal: No acute bony abnormality. IMPRESSION: Complex process in the pelvis including what appears to be a large complex fluid collection measuring 12 x 8 cm extending from the cul-de-sac superiorly to the cecum which could reflect blood products or abscess. This also could conceivably be related to and ovary. Neither ovary definitively identified. Wall thickening in the cecum could reflect colitis or secondary inflammation related to the pelvic process. Perihepatic and  perisplenic ascites. Possible trace loculated right pleural effusion peripherally in the right lower hemithorax. Electronically Signed   By: Rolm Baptise M.D.   On: 02/04/2022 22:29   US PELVIC COMPLETE WITH TRANSVAGINAL  Addendum Date: 02/04/2022   ADDENDUM REPORT: 02/04/2022 13:44 ADDENDUM: In light of negative quantitative beta HCG and no documented pregnancy the possibility of infectious causes for the above findings may be more likely but findings are essentially quite nonspecific at this time. Even noncontrast CT could prove beneficial to allow for assessment of pelvic fluid to determine whether this represents frank hemoperitoneum and to assess for any associated inflammatory changes that may be present not well assessed on ultrasound. These results were called by telephone at the time of interpretation on 02/04/2022 at 1:44 pm to provider [facility] , who verbally acknowledged these results. Electronically Signed   By: Zetta Bills M.D.   On: 02/04/2022 13:44   Result Date: 02/04/2022 CLINICAL DATA:  A 31 year old female presents with pelvic pain and bleeding. Patient reports positive pregnancy but urine pregnancy currently negative by report and beta HCG is currently pending. EXAM: TRANSABDOMINAL AND TRANSVAGINAL ULTRASOUND OF PELVIS TECHNIQUE: Both transabdominal and transvaginal ultrasound examinations of the pelvis were performed. Transabdominal technique was performed for global imaging of the pelvis including uterus, ovaries, adnexal regions, and pelvic cul-de-sac. It was necessary to proceed with endovaginal exam following the transabdominal exam to visualize the endometrium and adnexa. COMPARISON:  No recent imaging is available for comparison. FINDINGS: Uterus Measurements: 12.6 x 8.0 x 8.5 (volume = 450 cc) cm. = volume: 450 mL. Heterogeneous uterus with thickened endometrium. Cystic area in the endometrial canal that measures 4.5 x 2.6 x 4.4 cm. This cystic characteristics and central  increased echogenicity. Central increased echogenicity the without discrete finding that would suggest embryo with very limited assessment due to patient discomfort and clinical condition. Endometrium Thickness: 3.3 cm. Thickened with cystic area in the fundus of the uterus. Right ovary Measurements: 3.2 x 2.7 x 2.1 (volume = 9.5 cc) cm = volume: 9.5 mL. Normal appearance/no adnexal mass. Left ovary Not visible, in the area of the LEFT adnexa is heterogeneous material that has the appearance of blood products but conforms to the cul-de-sac and LEFT adnexa. Other findings Moderate heterogeneous fluid in the LEFT adnexa primarily but also tracking along the RIGHT uterus. IMPRESSION: Unusual cystic area in the endometrial canal. If this patient has any history of recent positive pregnancy test this could represent a failed gestation with retained products of conception. Pseudo gestational sac with areas of internal hemorrhage in the setting of ruptured ectopic pregnancy is also considered. Heterogeneous appearance of material in the cul-de-sac could also potentially represent sequela of pyogenic infection though this is lower on the differential at this time based on appearance. If the patient could tolerate further evaluation with cross-sectional imaging CT could potentially be helpful. Ultimately gyn consultation is suggested. Critical  Value/emergent results were called by telephone at the time of interpretation on 02/04/2022 at 11:01 am to provider [facility] , who verbally acknowledged these results. Electronically Signed: By: Zetta Bills M.D. On: 02/04/2022 11:01   DG Chest Port 1 View  Result Date: 02/04/2022 CLINICAL DATA:  32 year old female with history of lower abdominal pain and vaginal bleeding 3 days ago. Fever. Low blood pressure. EXAM: PORTABLE CHEST 1 VIEW COMPARISON:  Chest x-ray 08/09/2021. FINDINGS: Lung volumes are slightly low. No consolidative airspace disease. No pleural effusions. No  pneumothorax. No evidence of pulmonary edema. Heart size is normal. The patient is rotated to the right on today's exam, resulting in distortion of the mediastinal contours and reduced diagnostic sensitivity and specificity for mediastinal pathology. IMPRESSION: 1. No radiographic evidence of acute cardiopulmonary disease. Electronically Signed   By: Vinnie Langton M.D.   On: 02/04/2022 10:55     Assessment: 31 year old female with bilateral lower extremity numbness extending from knees to ankles in a circumferential distribution 1. Exam reveals apparent complete sensory loss to temp, FT and pressure extending from just distal to the knees, to just proximal to the ankles bilaterally, with normal apparent sensation to her feet and thighs bilaterally. The distribution within the boundaries described above is circumferential and non-dermatomal. The findings are not localizable and are felt to be non-physiologic in origin.  2. CT of abdomen and pelvis reveals a complex process in the pelvis including what appears to be a large complex fluid collection measuring 12 x 8 cm extending from the cul-de-sac superiorly to the cecum which could reflect blood products or abscess. This also could conceivably be related to and ovary. Neither ovary definitively identified. Wall thickening in the cecum could reflect colitis or secondary inflammation related to the pelvic process. Perihepatic and perisplenic ascites. Possible trace loculated right pleural effusion peripherally in the right lower hemithorax.   Recommendations: 1. Can hold off on MRI of lumbar spine as the patient's symptoms and subjective sensory loss distribution on exam are non-physiologic/non-localizable.  2. Diagnosis and management of pelvic lesion per CCM and Ob/Gyn. 3. Neurology will follow on a PRN basis. Please call us with any additional questions.    Electronically signed: Dr. Kerney Elbe 02/05/2022, 2:45 AM

## 2022-02-05 NOTE — TOC Progression Note (Signed)
Transition of Care [facility]) - Initial/Assessment Note    Patient Details  Name: Paula Lucas MRN: [id number] Date of Birth: 25-May-1991  Transition of Care [facility]) CM/SW Contact:    Ralene Bathe, LCSWA Phone Number: 02/05/2022, 4:50 PM  Clinical Narrative:                  Transition of Care Department [facility]) has reviewed patient and no TOC needs have been identified at this time. We will continue to monitor patient advancement through interdisciplinary progression rounds. If new patient transition needs arise, please place a TOC consult.          Patient Goals and CMS Choice        Expected Discharge Plan and Services                                                Prior Living Arrangements/Services                       Activities of Daily Living Home Assistive Devices/Equipment: None ADL Screening (condition at time of admission) Patient's cognitive ability adequate to safely complete daily activities?: Yes Is the patient deaf or have difficulty hearing?: No Does the patient have difficulty seeing, even when wearing glasses/contacts?: No Does the patient have difficulty concentrating, remembering, or making decisions?: No Patient able to express need for assistance with ADLs?: No Does the patient have difficulty dressing or bathing?: No Independently performs ADLs?: Yes (appropriate for developmental age) Does the patient have difficulty walking or climbing stairs?: No Weakness of Legs: None Weakness of Arms/Hands: None  Permission Sought/Granted                  Emotional Assessment              Admission diagnosis:  Septic shock (HCC) [A41.9, R65.21] Patient Active Problem List   Diagnosis Date Noted   Septic shock (HCC) 02/04/2022   Pelvic abscess     PCP:  Pcp, No Pharmacy:   [id number] - [place], [place] - [address] [place] [address] Phone: [phone] Fax:  [phone]     Social Determinants of Health (SDOH) Interventions    Readmission Risk Interventions     No data to display

## 2022-02-05 NOTE — Progress Notes (Signed)
Attending:    Subjective: Admitted yesterday in setting of abdominal pain and septic shock from tubo-ovarian abscess/PID Neurology saw her overnight: non-focal findings, no MRI recommended  Objective: Vitals:   02/05/22 1000 02/05/22 1015 02/05/22 1030 02/05/22 1045  BP: 106/60 100/62 93/60 98/63   Pulse: (!) 111 (!) 115 (!) 114 (!) 114  Resp: 20 (!) 23 (!) 24 (!) 23  Temp:      TempSrc:      SpO2: 93% 94% 95% 96%  Weight:      Height:          Intake/Output Summary (Last 24 hours) at 02/05/2022 1129 Last data filed at 02/05/2022 1000 Gross per 24 hour  Intake 1643.77 ml  Output 1150 ml  Net 493.77 ml    General:  Resting comfortably in bed HENT: NCAT OP clear PULM: CTA B, normal effort CV: RRR, no mgr GI: BS+, some tenderness to exam but no guarding or rebound MSK: normal bulk and tone Neuro: awake, alert, no distress, MAEW    CBC    Component Value Date/Time   WBC 16.9 (H) 02/05/2022 0347   RBC 3.90 02/05/2022 0347   HGB 11.2 (L) 02/05/2022 0347   HGB 11.2 04/13/2018 1512   HCT 34.2 (L) 02/05/2022 0347   HCT 35.0 04/13/2018 1512   PLT 563 (H) 02/05/2022 0347   PLT 437 04/13/2018 1512   MCV 87.7 02/05/2022 0347   MCV 92 04/13/2018 1512   MCH 28.7 02/05/2022 0347   MCHC 32.7 02/05/2022 0347   RDW 15.0 02/05/2022 0347   RDW 15.8 (H) 04/13/2018 1512   LYMPHSABS 2.0 02/04/2022 0901   LYMPHSABS 4.7 (H) 04/13/2018 1512   MONOABS 0.3 02/04/2022 0901   EOSABS 0.0 02/04/2022 0901   EOSABS 0.3 04/13/2018 1512   BASOSABS 0.0 02/04/2022 0901   BASOSABS 0.0 04/13/2018 1512    BMET    Component Value Date/Time   NA 134 (L) 02/05/2022 0347   K 3.8 02/05/2022 0347   CL 100 02/05/2022 0347   CO2 22 02/05/2022 0347   GLUCOSE 126 (H) 02/05/2022 0347   BUN 7 02/05/2022 0347   CREATININE 1.16 (H) 02/05/2022 0347   CALCIUM 7.9 (L) 02/05/2022 0347   GFRNONAA >60 02/05/2022 0347   GFRAA >60 01/15/2019 1308      Impression/Plan: Septic shock due to pelvic  abscess/PID> continue ceftriaxone flagyl, discussed with Gyn, needs percutaneous drainage by IR today, have ordered, wean off levophed for MAP > 65 Extremity numbness> resolved, monitor neuro exams per routine AKI improved> monitor UOP, BMET Hyperglycemia, no history of diabetes mellitus and Hgb A1c normal> monitor CBG, SSI with goal 140-180;  Rest per NP note  My cc time 20 minutes  Heber Meggett, MD [place] [facility] Pager: [phone] Cell: [phone] After 7pm: [phone]

## 2022-02-05 NOTE — Progress Notes (Signed)
NAME:  Paula Lucas, MRN:  [id number], DOB:  May 01, 1991, LOS: 1 ADMISSION DATE:  02/04/2022, CONSULTATION DATE:  02/04/22 REFERRING MD:  Rhunette Croft - EM, CHIEF COMPLAINT:  abdominal pain // septic shock    History of Present Illness:   31 yo F PMH R tubo-ovarian abscess, Chlamydia, BV, Gonorrhea HTN, medication non-adherence,     Presented to [facility] ED 6/15 with continued c/o severe abdominal pain, as well as citing recent vaginal bleeding. Pt reported she is [redacted]wk pregnant (would be G2P1) as per 3 separate home pregnancy tests. 3 days ago the pt started experiencing vaginal bleeding. She describes this as "lots of clots and running down my legs" and says this has been ongoing. 1 day after bleeding started (2d prior to presentation) the pt started to have abrupt onset severe lower abdominal pain, radiating to back.   Pt first presented to [facility] 6/14 with severe abd pain, n/v in setting but left AMA prior to evaluation.  Notably, in ED serum pregnancy test is neg.  A transvag ultrasound was concerning for free fluid in pelvis and intrauterine mass  Hypotensive in ED despite 2L IVF and being started on NE. Started on pip/tazo.    PCCM consulted for admission for presumed septic shock    Pertinent  Medical History  Hx r ovarian abscess  Chlamydia, Gonorrhea, BV  Undifferentiated painful skin lesions   Significant Hospital Events: Including procedures, antibiotic start and stop dates in addition to other pertinent events   6/14 [facility] presentation for severe abdominal pain, reportedly [redacted]wk pregnant but left [facility] 6/15 [facility] ED presentation for severe abdominal pain. Admitting to [facility] ICU for septic shock.  6/16 remains on pressors.   Interim History / Subjective:  Complains of ongoing abdominal pain. No nausea and vomiting.   Objective   Blood pressure 98/63, pulse (!) 114, temperature (!) 102.5 F (39.2 C), temperature source Oral, resp. rate (!) 23, height 5\' 3"  (1.6 m), weight 77.1 kg, SpO2  96 %, unknown if currently breastfeeding.        Intake/Output Summary (Last 24 hours) at 02/05/2022 1050 Last data filed at 02/05/2022 1000 Gross per 24 hour  Intake 1643.77 ml  Output 1150 ml  Net 493.77 ml   Filed Weights   02/04/22 0847  Weight: 77.1 kg    Examination: General: young adult female resting comfortably HENT: [place]/AT, PERRL, no JVD Lungs: Clear bilateral breath sounds.  Cardiovascular: RRR, no MRG GI: Unable to assess Extremities: + sensation and movement to bilateral lower extremities.  Neuro: alert, oriented, non-focal.   Labs  Creat 1.16, ALT 48, WBC 16.9, Hemoglobin 11.2  CT scan: Complex process in the pelvis including what appears to be a large complex fluid collection measuring 12 x 8 cm extending from the cul-de-sac superiorly to the cecum which could reflect blood products or abscess. This also could conceivably be related to and ovary. Neither ovary definitively identified. Wall thickening in the cecum could reflect colitis or secondary inflammation related to the pelvic process.  Resolved Hospital Problem list     Assessment & Plan:    Septic shock secondary to tubo-ovarian abscess, pyometrium. There was concern per patient she was pregnant, however, pregnancy test in the ED was negative. This raised concern for incomplete abortion and retained products of conception. OB/GYN was consulted and does not feel this to be the case. CT showed 12 x 8 cm complex fluid collection pelvis extending from the cul-de-sac superiorly to the cecum.  It could be  a result of tuboovarian abscess process, likely originating from left ovary as seen on pelvic ultrasound. noted Wet prep positive for clue cells.  Bacterial vaginosis P - Empiric antibiotic treatment with ceftriaxone, doxycycline, metronidazole - OB/GYN following, no role for surgery at this time.  - Will ask IR to consider perc drainage of abscess - Norepinephrine peripherally for MAP goal - pain  management PRN  Lactic acidosis : resolved -in setting of acute processes, supportive care   AKI - Trend BMP - BP support  Lower extremity numbness, bilateral.  - seen by neurology. No need for MRI. Symptoms improved. Intact sensation on exam this morning.  Transaminitis improved - AM LFTs, coags   Anemia: improved Thrombocytosis: improved  -Follow H&H   Best Practice (right click and "Reselect all SmartList Selections" daily)   Diet/type: NPO DVT prophylaxis: prophylactic heparin  GI prophylaxis: N/A Lines: N/A Foley:  N/A Code Status:  full code Last date of multidisciplinary goals of care discussion [pt updated 6/15]    Critical care time: 34 min necessary due to septic shock requiring vasoactive medications.     Joneen Roach, AGACNP-BC [facility]  See Amion for personal pager PCCM on call pager [phone] until 7pm. Please call Elink 7p-7a. [phone]  02/05/2022 10:52 AM

## 2022-02-05 NOTE — Consult Note (Signed)
[facility] OB/GYN Follow up Consult Note  Subjective:  Continued pain reported, no other acute symptoms.   Still required pressors to maintain MAP.  Admitted on 02/04/2022 for Pelvic abscess in female.    Objective:  Blood pressure 93/61, pulse (!) 111, temperature (!) 102.5 F (39.2 C), temperature source Oral, resp. rate (!) 26, height 5\' 3"  (1.6 m), weight 77.1 kg, SpO2 96 %, unknown if currently breastfeeding. Gen: NAD HENT: Normocephalic, atraumatic Lungs: Normal respiratory effort, no respiratory distress Heart: Regular rate noted Abdomen:  soft, moderate TTP, no rebound/guarding Cervix: Deferred Ext: Normal bulk and tone, normal reflexes, no edema/cyanosis  Studies:    Latest Ref Rng & Units 02/05/2022    3:47 AM 02/04/2022    9:01 AM 08/09/2021    3:23 PM  CBC  WBC 4.0 - 10.5 K/uL 16.9  10.8    11.3  7.9   Hemoglobin 12.0 - 15.0 g/dL 08/11/2021  02.6    37.8  58.8   Hematocrit 36.0 - 46.0 % 34.2  33.5    33.4  39.9   Platelets 150 - 400 K/uL 563  617    621  345       Latest Ref Rng & Units 02/05/2022    3:47 AM 02/04/2022    2:53 PM 02/04/2022    9:01 AM  CMP  Glucose 70 - 99 mg/dL 02/06/2022  774  128   BUN 6 - 20 mg/dL 7  9  16    Creatinine 0.44 - 1.00 mg/dL 786   7.67   Sodium 135 - 145 mmol/L 134  137  134   Potassium 3.5 - 5.1 mmol/L 3.8  4.6  4.6   Chloride 98 - 111 mmol/L 100  106  102   CO2 22 - 32 mmol/L 22  19  22    Calcium 8.9 - 10.3 mg/dL 7.9  8.3  8.5   Total Protein 6.5 - 8.1 g/dL 6.6   7.8   Total Bilirubin 0.3 - 1.2 mg/dL 1.2   1.8   Alkaline Phos 38 - 126 U/L 74   90   AST 15 - 41 U/L 28   60   ALT 0 - 44 U/L 48   78    CT ABDOMEN PELVIS WO CONTRAST  Result Date: 02/04/2022 CLINICAL DATA:  Abdominal pain, acute, nonlocalized septic shock, abnormal uterine 4.70, concern for PID EXAM: CT ABDOMEN AND PELVIS WITHOUT CONTRAST TECHNIQUE: Multidetector CT imaging of the abdomen and pelvis was performed following the standard protocol without IV  contrast. RADIATION DOSE REDUCTION: This exam was performed according to the departmental dose-optimization program which includes automated exposure control, adjustment of the mA and/or kV according to patient size and/or use of iterative reconstruction technique. COMPARISON:  Pelvic ultrasound today FINDINGS: Lower chest: Pleural thickening noted peripherally in the right lower hemithorax, possibly representing trace loculated pleural effusion. No confluent airspace opacities. Hepatobiliary: No focal hepatic abnormality. Gallbladder unremarkable. Pancreas: No focal abnormality or ductal dilatation. Spleen: No focal abnormality.  Normal size. Adrenals/Urinary Tract: No adrenal abnormality. No focal renal abnormality. No stones or hydronephrosis. Urinary bladder is unremarkable. Stomach/Bowel: Wall thickening noted in the cecum which is immediately adjacent to the large pelvic fluid collection described below. This could reflect colitis or be secondarily inflamed. Stomach and small bowel grossly unremarkable. Vascular/Lymphatic: No evidence of aneurysm or adenopathy. Reproductive: Large complex cystic area or fluid collection seen in the pelvis extending from the cul-de-sac superiorly into the right lower quadrant  adjacent to the cecum. This measures approximately 12 x 8 cm on image 64 of series 3. Internal areas of increased density. This could reflect blood or abscess/PID. Uterus grossly unremarkable on this noncontrast study. Ovaries not definitively visualized. Other: There is free fluid adjacent to the liver and spleen. Musculoskeletal: No acute bony abnormality. IMPRESSION: Complex process in the pelvis including what appears to be a large complex fluid collection measuring 12 x 8 cm extending from the cul-de-sac superiorly to the cecum which could reflect blood products or abscess. This also could conceivably be related to and ovary. Neither ovary definitively identified. Wall thickening in the cecum could  reflect colitis or secondary inflammation related to the pelvic process. Perihepatic and perisplenic ascites. Possible trace loculated right pleural effusion peripherally in the right lower hemithorax. Electronically Signed   By: Charlett Nose M.D.   On: 02/04/2022 22:29     Assessment & Plan:  31 y.o. G2P0010 admitted for sepsis secondary to pelvic abscess. 12 x 8 cm complex fluid collection noted in pelvis extending from the cul-de-sac superiorly to the cecum.  It could be a result of tuboovarian abscess process, likely originating from left ovary as seen on pelvic ultrasound.  IR consultation for possible drainage recommended, discussed with Dr. Kendrick Fries (CCM). Will follow up IR recommendations. No gynecologic surgical intervention necessary for now. Continue analgesia as needed.  Continue Ceftriaxone/Doxycycline/Flagyl, GC/Chlam cultures still pending.   Will defer management of sepsis to the CCM team, appreciate their expertise.   Appreciate care of DARCUS EDDS by her primary team We will follow along with daily rounding on patient Please call [phone] [facility] OB/GYN Consult Attending Monday-Friday 8am - 5pm) or [phone] [facility] OB/GYN Attending On Call all day, every day) for any gynecologic concerns at any time.  Thank you for involving [place] in the care of this patient.   Total consultation time including face-to-face time with patient (>50% of time), reviewing chart and documentation: 30 minutes    Jaynie Collins, MD, FACOG Obstetrician & Gynecologist, [facility] for [employer], [facility]

## 2022-02-05 NOTE — Progress Notes (Signed)
An USGPIV (ultrasound guided PIV) has been placed for short-term vasopressor infusion. A correctly placed ivWatch must be used when administering Vasopressors. Should this treatment be needed beyond 72 hours, central line access should be obtained.  It will be the responsibility of the bedside nurse to follow best practice to prevent extravasations.   ?

## 2022-02-05 NOTE — Consult Note (Signed)
Chief Complaint: Pelvic fluid collection  Referring Physician(s): Max Fickle  Supervising Physician: Roanna Banning  Patient Status: [facility]  History of Present Illness: Paula Lucas is a 31 y.o. female with medical issues including chlamydia, gonorrhea, tubo-ovarian abscess, asthma and HTN.  She presented to the ED yesterday with lower abdominal pain and a 3 day history of vaginal bleeding.  Transvaginal ultrasound was concerning for free fluid in the pelvis and an intrauterine mass.   CT scan showed = Complex process in the pelvis including what appears to be a large complex fluid collection measuring 12 x 8 cm extending from the cul-de-sac superiorly to the cecum which could reflect blood products or abscess.   We are asked to evaluate for drain placement.  Images reviewed by Dr. Milford Cage.  Past Medical History:  Diagnosis Date   Asthma    BMI 31.0-31.9,adult    Chlamydia    Gonorrhea    TOA (tubo-ovarian abscess)     Past Surgical History:  Procedure Laterality Date   INDUCED ABORTION     IR RADIOLOGIST EVAL & MGMT  05/17/2018   TONSILLECTOMY      Allergies: Fish allergy, Motrin [ibuprofen], Mushroom extract complex, Shellfish-derived products, and Tylenol [acetaminophen]  Medications: Prior to Admission medications   Medication Sig Start Date End Date Taking? Authorizing Provider  doxycycline (VIBRAMYCIN) 100 MG capsule Take 1 capsule (100 mg total) by mouth 2 (two) times daily. Patient not taking: Reported on 02/04/2022 08/04/20   Liberty Handy, PA-C  hydrOXYzine (ATARAX/VISTARIL) 25 MG tablet Take 1 tablet (25 mg total) by mouth every 6 (six) hours. Patient not taking: Reported on 02/04/2022 08/04/20   Liberty Handy, PA-C  methylPREDNISolone (MEDROL DOSEPAK) 4 MG TBPK tablet Follow package insert Patient not taking: Reported on 02/04/2022 08/10/21   Tanda Rockers, PA-C  penicillin v potassium (VEETID) 500 MG tablet Take 1 tablet  (500 mg total) by mouth 3 (three) times daily. Patient not taking: Reported on 02/04/2022 05/24/19   Mardella Layman, MD  permethrin (ELIMITE) 5 % cream Apply to affected area once Patient not taking: Reported on 02/04/2022 08/04/20   Liberty Handy, PA-C  traMADol (ULTRAM) 50 MG tablet Take 1 tablet (50 mg total) by mouth every 6 (six) hours as needed. Patient not taking: Reported on 02/04/2022 05/25/19   Eustace Moore, MD     Family History  Problem Relation Age of Onset   Diabetes Mother    Hypertension Mother    Diabetes Father    Hypertension Father    Diabetes Sister    Hypertension Sister     Social History   Socioeconomic History   Marital status: Single    Spouse name: Not on file   Number of children: Not on file   Years of education: Not on file   Highest education level: Not on file  Occupational History   Occupation: UNEMPLOYED  Tobacco Use   Smoking status: Smoker, Current Status Unknown    Packs/day: 0.50    Years: 9.00    Total pack years: 4.50    Types: Cigarettes   Smokeless tobacco: Never  Substance and Sexual Activity   Alcohol use: No   Drug use: Yes    Types: Marijuana    Comment: THC GUMMIES LAST MONTH   Sexual activity: Yes  Other Topics Concern   Not on file  Social History Narrative   Not on file   Social Determinants of Health   Financial  Resource Strain: Medium Risk (04/01/2018)   Overall Financial Resource Strain (CARDIA)    Difficulty of Paying Living Expenses: Somewhat hard  Food Insecurity: Food Insecurity Present (04/01/2018)   Hunger Vital Sign    Worried About Running Out of Food in the Last Year: Sometimes true    Ran Out of Food in the Last Year: Sometimes true  Transportation Needs: Unmet Transportation Needs (04/01/2018)   PRAPARE - Administrator, Civil Service (Medical): Yes    Lack of Transportation (Non-Medical): Yes  Physical Activity: Insufficiently Active (04/01/2018)   Exercise Vital Sign    Days of  Exercise per Week: 1 day    Minutes of Exercise per Session: 120 min  Stress: No Stress Concern Present (04/01/2018)   [employer] of Occupational Health - Occupational Stress Questionnaire    Feeling of Stress : Not at all  Social Connections: Unknown (04/01/2018)   Social Connection and Isolation Panel [NHANES]    Frequency of Communication with Friends and Family: Once a week    Frequency of Social Gatherings with Friends and Family: Once a week    Attends Religious Services: Not on Marketing executive or Organizations: No    Attends Banker Meetings: Never    Marital Status: Never married     Review of Systems: A 12 point ROS discussed and pertinent positives are indicated in the HPI above.  All other systems are negative.  Review of Systems  Vital Signs: BP 98/63   Pulse (!) 114   Temp (!) 102.5 F (39.2 C) (Oral)   Resp (!) 23   Ht 5\' 3"  (1.6 m)   Wt 169 lb 14.4 oz (77.1 kg)   SpO2 96%   Breastfeeding Unknown   BMI 30.10 kg/m   Physical Exam Vitals reviewed.  Constitutional:      Appearance: Normal appearance.  HENT:     Head: Normocephalic and atraumatic.  Eyes:     Extraocular Movements: Extraocular movements intact.  Cardiovascular:     Rate and Rhythm: Regular rhythm. Tachycardia present.  Pulmonary:     Effort: Pulmonary effort is normal. No respiratory distress.     Breath sounds: Normal breath sounds.  Abdominal:     Palpations: Abdomen is soft.     Tenderness: There is abdominal tenderness.  Musculoskeletal:        General: Normal range of motion.     Cervical back: Normal range of motion.  Skin:    General: Skin is warm and dry.  Neurological:     General: No focal deficit present.     Mental Status: She is alert and oriented to person, place, and time.  Psychiatric:        Mood and Affect: Mood normal.        Behavior: Behavior normal.        Thought Content: Thought content normal.        Judgment: Judgment  normal.     Imaging: CT ABDOMEN PELVIS WO CONTRAST  Result Date: 02/04/2022 CLINICAL DATA:  Abdominal pain, acute, nonlocalized septic shock, abnormal uterine 02/06/2022, concern for PID EXAM: CT ABDOMEN AND PELVIS WITHOUT CONTRAST TECHNIQUE: Multidetector CT imaging of the abdomen and pelvis was performed following the standard protocol without IV contrast. RADIATION DOSE REDUCTION: This exam was performed according to the departmental dose-optimization program which includes automated exposure control, adjustment of the mA and/or kV according to patient size and/or use of iterative reconstruction technique. COMPARISON:  Pelvic ultrasound today FINDINGS: Lower chest: Pleural thickening noted peripherally in the right lower hemithorax, possibly representing trace loculated pleural effusion. No confluent airspace opacities. Hepatobiliary: No focal hepatic abnormality. Gallbladder unremarkable. Pancreas: No focal abnormality or ductal dilatation. Spleen: No focal abnormality.  Normal size. Adrenals/Urinary Tract: No adrenal abnormality. No focal renal abnormality. No stones or hydronephrosis. Urinary bladder is unremarkable. Stomach/Bowel: Wall thickening noted in the cecum which is immediately adjacent to the large pelvic fluid collection described below. This could reflect colitis or be secondarily inflamed. Stomach and small bowel grossly unremarkable. Vascular/Lymphatic: No evidence of aneurysm or adenopathy. Reproductive: Large complex cystic area or fluid collection seen in the pelvis extending from the cul-de-sac superiorly into the right lower quadrant adjacent to the cecum. This measures approximately 12 x 8 cm on image 64 of series 3. Internal areas of increased density. This could reflect blood or abscess/PID. Uterus grossly unremarkable on this noncontrast study. Ovaries not definitively visualized. Other: There is free fluid adjacent to the liver and spleen. Musculoskeletal: No acute bony abnormality.  IMPRESSION: Complex process in the pelvis including what appears to be a large complex fluid collection measuring 12 x 8 cm extending from the cul-de-sac superiorly to the cecum which could reflect blood products or abscess. This also could conceivably be related to and ovary. Neither ovary definitively identified. Wall thickening in the cecum could reflect colitis or secondary inflammation related to the pelvic process. Perihepatic and perisplenic ascites. Possible trace loculated right pleural effusion peripherally in the right lower hemithorax. Electronically Signed   By: Charlett Nose M.D.   On: 02/04/2022 22:29   US PELVIC COMPLETE WITH TRANSVAGINAL  Addendum Date: 02/04/2022   ADDENDUM REPORT: 02/04/2022 13:44 ADDENDUM: In light of negative quantitative beta HCG and no documented pregnancy the possibility of infectious causes for the above findings may be more likely but findings are essentially quite nonspecific at this time. Even noncontrast CT could prove beneficial to allow for assessment of pelvic fluid to determine whether this represents frank hemoperitoneum and to assess for any associated inflammatory changes that may be present not well assessed on ultrasound. These results were called by telephone at the time of interpretation on 02/04/2022 at 1:44 pm to provider [facility] , who verbally acknowledged these results. Electronically Signed   By: Donzetta Kohut M.D.   On: 02/04/2022 13:44   Result Date: 02/04/2022 CLINICAL DATA:  A 31 year old female presents with pelvic pain and bleeding. Patient reports positive pregnancy but urine pregnancy currently negative by report and beta HCG is currently pending. EXAM: TRANSABDOMINAL AND TRANSVAGINAL ULTRASOUND OF PELVIS TECHNIQUE: Both transabdominal and transvaginal ultrasound examinations of the pelvis were performed. Transabdominal technique was performed for global imaging of the pelvis including uterus, ovaries, adnexal regions, and pelvic  cul-de-sac. It was necessary to proceed with endovaginal exam following the transabdominal exam to visualize the endometrium and adnexa. COMPARISON:  No recent imaging is available for comparison. FINDINGS: Uterus Measurements: 12.6 x 8.0 x 8.5 (volume = 450 cc) cm. = volume: 450 mL. Heterogeneous uterus with thickened endometrium. Cystic area in the endometrial canal that measures 4.5 x 2.6 x 4.4 cm. This cystic characteristics and central increased echogenicity. Central increased echogenicity the without discrete finding that would suggest embryo with very limited assessment due to patient discomfort and clinical condition. Endometrium Thickness: 3.3 cm. Thickened with cystic area in the fundus of the uterus. Right ovary Measurements: 3.2 x 2.7 x 2.1 (volume = 9.5 cc) cm = volume: 9.5 mL. Normal  appearance/no adnexal mass. Left ovary Not visible, in the area of the LEFT adnexa is heterogeneous material that has the appearance of blood products but conforms to the cul-de-sac and LEFT adnexa. Other findings Moderate heterogeneous fluid in the LEFT adnexa primarily but also tracking along the RIGHT uterus. IMPRESSION: Unusual cystic area in the endometrial canal. If this patient has any history of recent positive pregnancy test this could represent a failed gestation with retained products of conception. Pseudo gestational sac with areas of internal hemorrhage in the setting of ruptured ectopic pregnancy is also considered. Heterogeneous appearance of material in the cul-de-sac could also potentially represent sequela of pyogenic infection though this is lower on the differential at this time based on appearance. If the patient could tolerate further evaluation with cross-sectional imaging CT could potentially be helpful. Ultimately gyn consultation is suggested. Critical Value/emergent results were called by telephone at the time of interpretation on 02/04/2022 at 11:01 am to [facility] , who verbally  acknowledged these results. Electronically Signed: By: Donzetta KohutGeoffrey  Wile M.D. On: 02/04/2022 11:01   DG Chest Port 1 View  Result Date: 02/04/2022 CLINICAL DATA:  31 year old female with history of lower abdominal pain and vaginal bleeding 3 days ago. Fever. Low blood pressure. EXAM: PORTABLE CHEST 1 VIEW COMPARISON:  Chest x-ray 08/09/2021. FINDINGS: Lung volumes are slightly low. No consolidative airspace disease. No pleural effusions. No pneumothorax. No evidence of pulmonary edema. Heart size is normal. The patient is rotated to the right on today's exam, resulting in distortion of the mediastinal contours and reduced diagnostic sensitivity and specificity for mediastinal pathology. IMPRESSION: 1. No radiographic evidence of acute cardiopulmonary disease. Electronically Signed   By: Trudie Reedaniel  Entrikin M.D.   On: 02/04/2022 10:55    Labs:  CBC: Recent Labs    08/09/21 1523 02/04/22 0901 02/05/22 0347  WBC 7.9 11.3*  10.8* 16.9*  HGB 13.1 10.9*  11.0* 11.2*  HCT 39.9 33.4*  33.5* 34.2*  PLT 345 621*  617* 563*    COAGS: Recent Labs    02/04/22 0901  INR 1.2  APTT 25    BMP: Recent Labs    08/09/21 1523 02/04/22 0901 02/04/22 1453 02/05/22 0347  NA 134* 134* 137 134*  K 3.4* 4.6 4.6 3.8  CL 104 102 106 100  CO2 20* 22 19* 22  GLUCOSE 78 119* 128* 126*  BUN 14 16 9 7   CALCIUM 9.2 8.5* 8.3* 7.9*  CREATININE 1.01* 1.52* 1.03* 1.16*  GFRNONAA >60 47* >60 >60    LIVER FUNCTION TESTS: Recent Labs    08/09/21 1523 02/04/22 0901 02/05/22 0347  BILITOT 0.9 1.8* 1.2  AST 28 60* 28  ALT 15 78* 48*  ALKPHOS 49 90 74  PROT 7.5 7.8 6.6  ALBUMIN 4.2 2.7* 2.0*    TUMOR MARKERS: No results for input(s): "AFPTM", "CEA", "CA199", "CHROMGRNA" in the last 8760 hours.  Assessment and Plan:  Pelvic Abscess  Will proceed with image guided drain placement today by Dr. Milford CageMugweru.  Risks and benefits discussed with the patient including bleeding, infection, damage to adjacent  structures, bowel perforation/fistula connection, and sepsis.  All of the patient's questions were answered, patient is agreeable to proceed. Consent signed and in chart.   Thank you for allowing our service to participate in Paula Lucas 's care.  Electronically Signed: Gwynneth MacleodWENDY S Lukis Bunt, PA-C   02/05/2022, 12:07 PM      I spent a total of 40 Minutes in face to face in clinical  consultation, greater than 50% of which was counseling/coordinating care for drain placement.

## 2022-02-05 NOTE — Progress Notes (Signed)
[facility] ADULT ICU REPLACEMENT PROTOCOL   The patient does apply for the [facility] Adult ICU Electrolyte Replacment Protocol based on the criteria listed below:   1.Exclusion criteria: TCTS patients, ECMO patients, and Dialysis patients 2. Is GFR >/= 30 ml/min? Yes.    Patient's GFR today is >60 3. Is SCr </= 2? Yes.   Patient's SCr is 1.16 mg/dL 4. Did SCr increase >/= 0.5 in 24 hours? No. 5.Pt's weight >40kg  Yes.   6. Abnormal electrolyte(s): mag 1.8  7. Electrolytes replaced per protocol 8.  Call MD STAT for K+ </= 2.5, Phos </= 1, or Mag </= 1 Physician:  n/a  Melvern Banker 02/05/2022 5:14 AM

## 2022-02-05 NOTE — Progress Notes (Signed)
Came to check on patient, she is s/p IR drainage for her pelvic abscess Drain in RLQ, has brown-yellow purulent fluid in bag Patient is sleeping comfortably Will continue to follow along, appreciate all the care from her CCM team.    Jaynie Collins, MD, FACOG Obstetrician & Gynecologist, [facility] for [employer], [facility]

## 2022-02-05 NOTE — Procedures (Signed)
Vascular and Interventional Radiology Procedure Note  Patient: Paula Lucas DOB: 16-Sep-1990 Medical Record Number: [id number] Note Date/Time: 02/05/22 1:17 PM   Performing Physician: Roanna Banning, MD Assistant(s): None  Diagnosis: TOA w abdominopelvic fluid collection  Procedure: DRAINAGE CATHETER PLACEMENT into ABDOMINAPELVIC FLUID COLLECTION  Anesthesia: Conscious Sedation Complications: None Estimated Blood Loss: Minimal Specimens: Sent for Gram Stain, Aerobe Culture, and Anerobe Culture  Findings:  Successful CT-guided placement of 12 F catheter into abdominopelvic fluid collection.  Plan:  - Flush drain with 5 mL Normal Saline every 8 hours. - Follow up drain evaluation / sinogram in 2 week(s).  See detailed procedure note with images in PACS. The patient tolerated the procedure well without incident or complication and was returned to ICU in stable condition.    Roanna Banning, MD [facility] Radiology   Pager. [phone] Clinic. [phone]

## 2022-02-06 LAB — URINE CULTURE: Culture: 20000 — AB

## 2022-02-06 LAB — BASIC METABOLIC PANEL
Anion gap: 13 (ref 5–15)
BUN: 7 mg/dL (ref 6–20)
CO2: 26 mmol/L (ref 22–32)
Calcium: 8 mg/dL — ABNORMAL LOW (ref 8.9–10.3)
Chloride: 94 mmol/L — ABNORMAL LOW (ref 98–111)
Creatinine, Ser: 0.89 mg/dL (ref 0.44–1.00)
GFR, Estimated: >60 mL/min (ref >60)
Glucose, Bld: 116 mg/dL — ABNORMAL HIGH (ref 70–99)
Potassium: 4.2 mmol/L (ref 3.5–5.1)
Sodium: 133 mmol/L — ABNORMAL LOW (ref 135–145)

## 2022-02-06 LAB — GLUCOSE, CAPILLARY
Glucose-Capillary: 106 mg/dL — ABNORMAL HIGH (ref 70–99)
Glucose-Capillary: 108 mg/dL — ABNORMAL HIGH (ref 70–99)
Glucose-Capillary: 110 mg/dL — ABNORMAL HIGH (ref 70–99)
Glucose-Capillary: 111 mg/dL — ABNORMAL HIGH (ref 70–99)
Glucose-Capillary: 121 mg/dL — ABNORMAL HIGH (ref 70–99)
Glucose-Capillary: 123 mg/dL — ABNORMAL HIGH (ref 70–99)

## 2022-02-06 LAB — CBC
HCT: 31.2 % — ABNORMAL LOW (ref 36.0–46.0)
Hemoglobin: 10.3 g/dL — ABNORMAL LOW (ref 12.0–15.0)
MCH: 29.2 pg (ref 26.0–34.0)
MCHC: 33 g/dL (ref 30.0–36.0)
MCV: 88.4 fL (ref 80.0–100.0)
Platelets: 470 10*3/uL — ABNORMAL HIGH (ref 150–400)
RBC: 3.53 MIL/uL — ABNORMAL LOW (ref 3.87–5.11)
RDW: 15.1 % (ref 11.5–15.5)
WBC: 20.5 10*3/uL — ABNORMAL HIGH (ref 4.0–10.5)
nRBC: 0 % (ref 0.0–0.2)

## 2022-02-06 LAB — PHOSPHORUS: Phosphorus: 3.3 mg/dL (ref 2.5–4.6)

## 2022-02-06 LAB — MAGNESIUM: Magnesium: 2.5 mg/dL — ABNORMAL HIGH (ref 1.7–2.4)

## 2022-02-06 MED ORDER — SALINE SPRAY 0.65 % NA SOLN
1.0000 | NASAL | Status: DC | PRN
  Filled 2022-02-06: qty 44

## 2022-02-06 MED ORDER — WHITE PETROLATUM EX OINT
TOPICAL_OINTMENT | CUTANEOUS | Status: DC | PRN
  Filled 2022-02-06: qty 28.35

## 2022-02-06 MED ORDER — METRONIDAZOLE 500 MG PO TABS
500.0000 mg | ORAL_TABLET | Freq: Two times a day (BID) | ORAL | Status: DC
Start: 2022-02-06 — End: 2022-02-07
  Administered 2022-02-06 – 2022-02-07 (×2): 500 mg via ORAL
  Filled 2022-02-06 (×2): qty 1

## 2022-02-06 NOTE — Progress Notes (Signed)
eLink Physician-Brief Progress Note Patient Name: Paula Lucas DOB: June 30, 1991 MRN: [id number]   Date of Service  02/06/2022  HPI/Events of Note  Dry lips.  eICU Interventions  PRN Vaseline balm ordered.        Kizer Nobbe U Annleigh Knueppel 02/06/2022, 2:05 AM

## 2022-02-06 NOTE — Progress Notes (Signed)
   NAME:  Paula Lucas, MRN:  [id number], DOB:  02-Mar-1991, LOS: 2 ADMISSION DATE:  02/04/2022, CONSULTATION DATE:  6/17 REFERRING MD: 6/15, CHIEF COMPLAINT:  abdominal pain   History of Present Illness:  31 y/o female admitted for septic shock from PID, pelvic floor abscess.  Pertinent  Medical History  Hx r ovarian abscess  Chlamydia, Gonorrhea, BV  Undifferentiated painful skin lesions  Significant Hospital Events: Including procedures, antibiotic start and stop dates in addition to other pertinent events   6/14 [facility] presentation for severe abdominal pain, reportedly [redacted]wk pregnant but left [facility] 6/15 [facility] ED presentation for severe abdominal pain. Admitting to [facility] ICU for septic shock. CT ab/pelv> pelvic floor abscess 6/16 neuro consult: patient complaints are not congruent with any clear pathology, remains on pressors, IR drain placed  Interim History / Subjective:  Awake Off pressors Doesn't want to eat  Objective   Blood pressure (!) 96/59, pulse (!) 114, temperature 99.4 F (37.4 C), temperature source Oral, resp. rate (!) 21, height 5\' 3"  (1.6 m), weight 76.9 kg, SpO2 92 %, unknown if currently breastfeeding.        Intake/Output Summary (Last 24 hours) at 02/06/2022 0723 Last data filed at 02/06/2022 0600 Gross per 24 hour  Intake 507.93 ml  Output 1200 ml  Net -692.07 ml   Filed Weights   02/04/22 0847 02/06/22 0500  Weight: 77.1 kg 76.9 kg    Examination: General:  Resting comfortably in bed HENT: NCAT OP clear PULM: CTA B, normal effort CV: RRR, no mgr GI: BS+, soft, tender to exam but no guarding or rebound MSK: normal bulk and tone Neuro: awake, alert, no distress, MAEW   Resolved Hospital Problem list     Assessment & Plan:  Septic shock from tubo-ovarian abscess, pyometrium, pelvic floor abscess> resolved Lactic acidosis> resolved AKI> resolved Shock liver > improved, mild Anemia, normocytic, mild, not bleeding Thrombocytosis: reactive MRSA  UTI? With only 20k cfu/mL  Discussion: Improved overall.  Needs to eat but refuses.  Reported pain is out of proportion to exam.  She is also refusing to get out of bed.  If she were to engage in her care plan more then she would improve her chances of a good outcome.  Plan: Continue IR drain, monitor output Move out of ICU, GYN service Continue antibiotics as ordered> ceftriaxone, doxy, flagyl; change doxy and flagyl to oral with 14 day stop dates; may be able to narrow tomorrow if WBC headed in right direction Monitor drain culture and narrow as appropriate Advance diet Out of bed  Best Practice (right click and "Reselect all SmartList Selections" daily)   Diet/type: Regular consistency (see orders) DVT prophylaxis: prophylactic heparin  GI prophylaxis: N/A Lines: N/A Foley:  N/A Code Status:  full code Last date of multidisciplinary goals of care discussion [on admission]  Critical care time: n/a     02/08/22, MD [place] [facility] Pager: [phone] Cell: [phone] After 7:00 pm call Elink  [phone]

## 2022-02-06 NOTE — Progress Notes (Signed)
Supervising Physician: Marliss Coots  Patient Status:  [facility] - In-pt  Chief Complaint:  1 day s/p pelvic abscess drain placement with Dr. Milford Cage  Subjective:  Audibly groans with pain with any physical touch.  Expresses pelvic pain and tenderness at site of drain.  Allergies: Fish allergy, Motrin [ibuprofen], Mushroom extract complex, Shellfish-derived products, and Tylenol [acetaminophen]  Medications: Prior to Admission medications   Medication Sig Start Date End Date Taking? Authorizing Provider  doxycycline (VIBRAMYCIN) 100 MG capsule Take 1 capsule (100 mg total) by mouth 2 (two) times daily. Patient not taking: Reported on 02/04/2022 08/04/20   Liberty Handy, PA-C  hydrOXYzine (ATARAX/VISTARIL) 25 MG tablet Take 1 tablet (25 mg total) by mouth every 6 (six) hours. Patient not taking: Reported on 02/04/2022 08/04/20   Liberty Handy, PA-C  methylPREDNISolone (MEDROL DOSEPAK) 4 MG TBPK tablet Follow package insert Patient not taking: Reported on 02/04/2022 08/10/21   Tanda Rockers, PA-C  penicillin v potassium (VEETID) 500 MG tablet Take 1 tablet (500 mg total) by mouth 3 (three) times daily. Patient not taking: Reported on 02/04/2022 05/24/19   Mardella Layman, MD  permethrin (ELIMITE) 5 % cream Apply to affected area once Patient not taking: Reported on 02/04/2022 08/04/20   Liberty Handy, PA-C  traMADol (ULTRAM) 50 MG tablet Take 1 tablet (50 mg total) by mouth every 6 (six) hours as needed. Patient not taking: Reported on 02/04/2022 05/25/19   Eustace Moore, MD     Vital Signs: BP (!) 90/43   Pulse (!) 109   Temp (!) 100.5 F (38.1 C) (Oral)   Resp (!) 22   Ht 5\' 3"  (1.6 m)   Wt 169 lb 8.5 oz (76.9 kg)   SpO2 95%   Breastfeeding Unknown   BMI 30.03 kg/m   Physical Exam HENT:     Mouth/Throat:     Pharynx: Oropharynx is clear.  Eyes:     Pupils: Pupils are equal, round, and reactive to light.  Pulmonary:     Effort: Pulmonary effort is  normal.  Abdominal:     Palpations: Abdomen is soft.     Tenderness: There is generalized abdominal tenderness.  Skin:    General: Skin is warm and dry.  Neurological:     Mental Status: She is alert.   Drain Location: RLQ Size: Fr size: 12 Fr Date of placement: 02/06/22  Currently to: Drain collection device: gravity 24 hour output:  Output by Drain (mL) 02/04/22 0700 - 02/04/22 1459 02/04/22 1500 - 02/04/22 2259 02/04/22 2300 - 02/05/22 0659 02/05/22 0700 - 02/05/22 1459 02/05/22 1500 - 02/05/22 2259 02/05/22 2300 - 02/06/22 0659 02/06/22 0700 - 02/06/22 1240  Closed System Drain 1 Right;Inferior;Anterior RLQ Other (Comment) 12 Fr.    5 160 60     Current examination: Flushes easily, but causes patient discomfort  Insertion site unremarkable. Suture in place. Dressed appropriately.    Imaging: CT IMAGE GUIDED DRAINAGE BY PERCUTANEOUS CATHETER  Result Date: 02/05/2022 INDICATION: PID with large abdominopelvic fluid collection. EXAM: CT GUIDED DRAINAGE CATHETER PLACEMENT INTO ABDOMINOPELVIC FLUID COLLECTION RADIATION DOSE REDUCTION: This exam was performed according to the departmental dose-optimization program which includes automated exposure control, adjustment of the mA and/or kV according to patient size and/or use of iterative reconstruction technique. COMPARISON:  CT AP, 02/04/2022 MEDICATIONS: The patient is currently admitted to the hospital and receiving intravenous antibiotics. The antibiotics were administered within an appropriate time frame prior to the initiation of the procedure. ANESTHESIA/SEDATION:  Moderate (conscious) sedation was employed during this procedure. A total of Versed 3 mg and Fentanyl 75 mcg was administered intravenously. Additionally, 1 mg Dilaudid was administered IV. Moderate Sedation Time: 19 minutes. The patient's level of consciousness and vital signs were monitored continuously by radiology nursing throughout the procedure under my direct  supervision. CONTRAST:  None COMPLICATIONS: None immediate. PROCEDURE: Informed written consent was obtained from the patient and/or patient's representative after a discussion of the risks, benefits and alternatives to treatment. The patient was placed supine on the CT gantry and a pre procedural CT was performed re-demonstrating the known abscess/fluid collection within the lower abdomen/pelvis. The procedure was planned. A timeout was performed prior to the initiation of the procedure. The RIGHT lower quadrant was prepped and draped in the usual sterile fashion. The overlying soft tissues were anesthetized with 1% lidocaine with epinephrine. Appropriate trajectory was planned with the use of a 22 gauge spinal needle. An 18 gauge trocar needle was advanced into the abscess/fluid collection and a short Amplatz super stiff wire was coiled within the collection. Appropriate positioning was confirmed with a limited CT scan. The tract was serially dilated allowing placement of a 12 [place] all-purpose drainage catheter. Appropriate positioning was confirmed with a limited postprocedural CT scan. 5 mL ml of serous fluid was aspirated and submitted for analysis. The tube was connected to a drainage bag and sutured in place. A dressing was placed. The patient tolerated the procedure well without immediate post procedural complication. IMPRESSION: Successful CT guided placement of a 12 Fr drainage catheter into the abdominopelvic collection with aspiration of serous fluid, as above. Samples were sent to the laboratory as requested by the ordering clinical team. Roanna Banning, MD [facility] Radiology Electronically Signed   By: Roanna Banning M.D.   On: 02/05/2022 18:08   CT ABDOMEN PELVIS WO CONTRAST  Result Date: 02/04/2022 CLINICAL DATA:  Abdominal pain, acute, nonlocalized septic shock, abnormal uterine US, concern for PID EXAM: CT ABDOMEN AND PELVIS WITHOUT CONTRAST  TECHNIQUE: Multidetector CT imaging of the abdomen and pelvis was performed following the standard protocol without IV contrast. RADIATION DOSE REDUCTION: This exam was performed according to the departmental dose-optimization program which includes automated exposure control, adjustment of the mA and/or kV according to patient size and/or use of iterative reconstruction technique. COMPARISON:  Pelvic ultrasound today FINDINGS: Lower chest: Pleural thickening noted peripherally in the right lower hemithorax, possibly representing trace loculated pleural effusion. No confluent airspace opacities. Hepatobiliary: No focal hepatic abnormality. Gallbladder unremarkable. Pancreas: No focal abnormality or ductal dilatation. Spleen: No focal abnormality.  Normal size. Adrenals/Urinary Tract: No adrenal abnormality. No focal renal abnormality. No stones or hydronephrosis. Urinary bladder is unremarkable. Stomach/Bowel: Wall thickening noted in the cecum which is immediately adjacent to the large pelvic fluid collection described below. This could reflect colitis or be secondarily inflamed. Stomach and small bowel grossly unremarkable. Vascular/Lymphatic: No evidence of aneurysm or adenopathy. Reproductive: Large complex cystic area or fluid collection seen in the pelvis extending from the cul-de-sac superiorly into the right lower quadrant adjacent to the cecum. This measures approximately 12 x 8 cm on image 64 of series 3. Internal areas of increased density. This could reflect blood or abscess/PID. Uterus grossly unremarkable on this noncontrast study. Ovaries not definitively visualized. Other: There is free fluid adjacent to the liver and spleen. Musculoskeletal: No acute bony abnormality. IMPRESSION: Complex process in the pelvis including what appears to be a large complex fluid collection measuring  12 x 8 cm extending from the cul-de-sac superiorly to the cecum which could reflect blood products or abscess. This also  could conceivably be related to and ovary. Neither ovary definitively identified. Wall thickening in the cecum could reflect colitis or secondary inflammation related to the pelvic process. Perihepatic and perisplenic ascites. Possible trace loculated right pleural effusion peripherally in the right lower hemithorax. Electronically Signed   By: Charlett Nose M.D.   On: 02/04/2022 22:29   US PELVIC COMPLETE WITH TRANSVAGINAL  Addendum Date: 02/04/2022   ADDENDUM REPORT: 02/04/2022 13:44 ADDENDUM: In light of negative quantitative beta HCG and no documented pregnancy the possibility of infectious causes for the above findings may be more likely but findings are essentially quite nonspecific at this time. Even noncontrast CT could prove beneficial to allow for assessment of pelvic fluid to determine whether this represents frank hemoperitoneum and to assess for any associated inflammatory changes that may be present not well assessed on ultrasound. These results were called by telephone at the time of interpretation on 02/04/2022 at 1:44 pm to provider [facility] , who verbally acknowledged these results. Electronically Signed   By: Donzetta Kohut M.D.   On: 02/04/2022 13:44   Result Date: 02/04/2022 CLINICAL DATA:  A 31 year old female presents with pelvic pain and bleeding. Patient reports positive pregnancy but urine pregnancy currently negative by report and beta HCG is currently pending. EXAM: TRANSABDOMINAL AND TRANSVAGINAL ULTRASOUND OF PELVIS TECHNIQUE: Both transabdominal and transvaginal ultrasound examinations of the pelvis were performed. Transabdominal technique was performed for global imaging of the pelvis including uterus, ovaries, adnexal regions, and pelvic cul-de-sac. It was necessary to proceed with endovaginal exam following the transabdominal exam to visualize the endometrium and adnexa. COMPARISON:  No recent imaging is available for comparison. FINDINGS: Uterus Measurements: 12.6 x 8.0 x  8.5 (volume = 450 cc) cm. = volume: 450 mL. Heterogeneous uterus with thickened endometrium. Cystic area in the endometrial canal that measures 4.5 x 2.6 x 4.4 cm. This cystic characteristics and central increased echogenicity. Central increased echogenicity the without discrete finding that would suggest embryo with very limited assessment due to patient discomfort and clinical condition. Endometrium Thickness: 3.3 cm. Thickened with cystic area in the fundus of the uterus. Right ovary Measurements: 3.2 x 2.7 x 2.1 (volume = 9.5 cc) cm = volume: 9.5 mL. Normal appearance/no adnexal mass. Left ovary Not visible, in the area of the LEFT adnexa is heterogeneous material that has the appearance of blood products but conforms to the cul-de-sac and LEFT adnexa. Other findings Moderate heterogeneous fluid in the LEFT adnexa primarily but also tracking along the RIGHT uterus. IMPRESSION: Unusual cystic area in the endometrial canal. If this patient has any history of recent positive pregnancy test this could represent a failed gestation with retained products of conception. Pseudo gestational sac with areas of internal hemorrhage in the setting of ruptured ectopic pregnancy is also considered. Heterogeneous appearance of material in the cul-de-sac could also potentially represent sequela of pyogenic infection though this is lower on the differential at this time based on appearance. If the patient could tolerate further evaluation with cross-sectional imaging CT could potentially be helpful. Ultimately gyn consultation is suggested. Critical Value/emergent results were called by telephone at the time of interpretation on 02/04/2022 at 11:01 am to provider [facility] , who verbally acknowledged these results. Electronically Signed: By: Donzetta Kohut M.D. On: 02/04/2022 11:01   DG Chest Port 1 View  Result Date: 02/04/2022 CLINICAL DATA:  31 year old  female with history of lower abdominal pain and vaginal bleeding 3  days ago. Fever. Low blood pressure. EXAM: PORTABLE CHEST 1 VIEW COMPARISON:  Chest x-ray 08/09/2021. FINDINGS: Lung volumes are slightly low. No consolidative airspace disease. No pleural effusions. No pneumothorax. No evidence of pulmonary edema. Heart size is normal. The patient is rotated to the right on today's exam, resulting in distortion of the mediastinal contours and reduced diagnostic sensitivity and specificity for mediastinal pathology. IMPRESSION: 1. No radiographic evidence of acute cardiopulmonary disease. Electronically Signed   By: Trudie Reed M.D.   On: 02/04/2022 10:55    Labs:  CBC: Recent Labs    08/09/21 1523 02/04/22 0901 02/05/22 0347 02/06/22 0429  WBC 7.9 11.3*  10.8* 16.9* 20.5*  HGB 13.1 10.9*  11.0* 11.2* 10.3*  HCT 39.9 33.4*  33.5* 34.2* 31.2*  PLT 345 621*  617* 563* 470*    COAGS: Recent Labs    02/04/22 0901  INR 1.2  APTT 25    BMP: Recent Labs    02/04/22 0901 02/04/22 1453 02/05/22 0347 02/06/22 0429  NA 134* 137 134* 133*  K 4.6 4.6 3.8 4.2  CL 102 106 100 94*  CO2 22 19* 22 26  GLUCOSE 119* 128* 126* 116*  BUN 16 9 7 7   CALCIUM 8.5* 8.3* 7.9* 8.0*  CREATININE 1.52* 1.03* 1.16* 0.89  GFRNONAA 47* >60 >60 >60    LIVER FUNCTION TESTS: Recent Labs    08/09/21 1523 02/04/22 0901 02/05/22 0347  BILITOT 0.9 1.8* 1.2  AST 28 60* 28  ALT 15 78* 48*  ALKPHOS 49 90 74  PROT 7.5 7.8 6.6  ALBUMIN 4.2 2.7* 2.0*    Assessment and Plan:  Pelvic abscess -1 day s/p drain placement with good drainage -appears to be in significant pain, hopeful this improves as the abscess continues to drain  Plan: Continue TID flushes with 5 cc NS. Record output Q shift. Dressing changes QD or PRN if soiled.  Call IR APP or on call IR MD if difficulty flushing or sudden change in drain output.  Repeat imaging/possible drain injection once output < 10 mL/QD (excluding flush material.)  Discharge planning: Please contact IR APP or on  call IR MD prior to patient d/c to ensure appropriate follow up plans are in place. Typically patient will follow up with IR clinic 10-14 days post d/c for repeat imaging/possible drain injection. IR scheduler will contact patient with date/time of appointment. Patient will need to flush drain QD with 5 cc NS, record output QD, dressing changes every 2-3 days or earlier if soiled.   IR will continue to follow - please call with questions or concerns.  Electronically Signed: 02/07/22, PA 02/06/2022, 12:36 PM   I spent a total of 15 Minutes at the the patient's bedside AND on the patient's hospital floor or unit, greater than 50% of which was counseling/coordinating care for drain management.

## 2022-02-06 NOTE — Consult Note (Addendum)
[facility] OB[facility]N Follow up Consult Note  Subjective:  Much improved pain reported, no other acute symptoms.   She is off pressors to maintain MAP but still requiring supplemental oxygen given periods of desaturation.  Admitted on 02/04/2022 for Pelvic abscess in female.    Objective:  Patient Vitals for the past 24 hrs:  BP Temp Temp src Pulse Resp SpO2 Weight  02/06/22 0900 101/65 -- -- (!) 111 (!) 22 93 % --  02/06/22 0800 (!) 92/57 -- -- (!) 112 17 92 % --  02/06/22 0733 -- 99.7 F (37.6 C) Oral -- -- -- --  02/06/22 0700 (!) 96/59 -- -- (!) 114 (!) 21 92 % --  02/06/22 0600 97/66 -- -- (!) 117 (!) 27 90 % --  02/06/22 0500 102/60 -- -- (!) 115 20 94 % 76.9 kg  02/06/22 0400 103/64 -- -- (!) 112 (!) 22 91 % --  02/06/22 0330 98/67 -- -- (!) 110 17 93 % --  02/06/22 0323 -- 99.4 F (37.4 C) Oral -- -- -- --  02/06/22 0305 92/61 -- -- (!) 111 19 92 % --  02/06/22 0200 (!) 95/56 -- -- (!) 113 15 (!) 89 % --  02/06/22 0100 100/61 -- -- (!) 116 (!) 25 92 % --  02/06/22 0006 (!) 99/58 -- -- (!) 111 19 93 % --  02/05/22 2346 90/61 -- -- (!) 111 19 94 % --  02/05/22 2336 -- 99.4 F (37.4 C) Oral -- -- -- --  02/05/22 2300 (!) 94/56 -- -- (!) 108 18 95 % --  02/05/22 2200 93/62 -- -- (!) 109 (!) 21 95 % --  02/05/22 2125 (!) 95/56 -- -- (!) 111 -- 95 % --  02/05/22 2045 96/68 -- -- (!) 112 19 94 % --  02/05/22 2015 92/64 -- -- (!) 111 19 97 % --  02/05/22 2000 (!) 94/56 -- -- (!) 108 20 97 % --  02/05/22 1930 100/66 -- -- (!) 115 (!) 22 96 % --  02/05/22 1920 -- 100 F (37.8 C) Oral -- -- -- --  02/05/22 1915 110/68 -- -- (!) 116 20 94 % --  02/05/22 1830 97/60 -- -- (!) 112 18 93 % --  02/05/22 1815 97/64 -- -- (!) 117 19 (!) 86 % --  02/05/22 1800 103/67 -- -- (!) 117 20 (!) 87 % --  02/05/22 1745 107/64 -- -- (!) 114 (!) 22 (!) 85 % --  02/05/22 1730 -- -- -- (!) 118 (!) 24 92 % --  02/05/22 1715 -- -- -- (!) 118 (!) 22 92 % --  02/05/22 1700 109/81 -- -- (!) 118  (!) 28 91 % --  02/05/22 1645 112/72 -- -- (!) 112 (!) 23 93 % --  02/05/22 1637 -- -- -- (!) 115 20 92 % --  02/05/22 1630 106/71 -- -- (!) 110 (!) 24 90 % --  02/05/22 1615 108/66 -- -- (!) 111 20 93 % --  02/05/22 1600 105/73 -- -- (!) 111 (!) 21 94 % --  02/05/22 1555 102/64 -- -- (!) 111 (!) 22 93 % --  02/05/22 1535 -- 99.4 F (37.4 C) Oral -- -- -- --  02/05/22 1500 111/66 -- -- (!) 112 (!) 21 90 % --  02/05/22 1415 97/76 -- -- (!) 107 17 95 % --  02/05/22 1405 92/69 -- -- (!) 105 18 96 % --  02/05/22 1400 105/66 -- -- Marland Kitchen  108 19 96 % --  02/05/22 1355 99/60 -- -- (!) 111 (!) 21 95 % --  02/05/22 1350 110/63 -- -- (!) 114 (!) 21 96 % --  02/05/22 1345 94/82 -- -- (!) 113 (!) 23 94 % --  02/05/22 1340 97/62 -- -- (!) 113 18 95 % --  02/05/22 1335 (!) 108/96 -- -- (!) 111 (!) 22 93 % --  02/05/22 1330 111/67 -- -- (!) 116 20 94 % --  02/05/22 1315 102/76 -- -- (!) 110 (!) 29 94 % --  02/05/22 1307 (!) 91/49 -- -- (!) 112 (!) 24 96 % --  02/05/22 1300 (!) 103/58 -- -- (!) 108 (!) 24 94 % --  02/05/22 1245 -- -- -- (!) 111 (!) 27 92 % --  02/05/22 1232 -- (!) 100.9 F (38.3 C) Oral -- -- -- --  02/05/22 1230 -- -- -- (!) 109 18 94 % --  02/05/22 1215 -- -- -- (!) 115 (!) 22 92 % --  02/05/22 1200 -- -- -- (!) 107 (!) 21 96 % --  02/05/22 1145 -- -- -- (!) 109 20 94 % --  02/05/22 1130 93/61 -- -- (!) 111 (!) 26 96 % --  02/05/22 1115 95/60 -- -- (!) 113 20 96 % --  02/05/22 1100 97/66 -- -- (!) 113 (!) 23 97 % --  02/05/22 1045 98/63 -- -- (!) 114 (!) 23 96 % --  02/05/22 1030 93/60 -- -- (!) 114 (!) 24 95 % --  02/05/22 1015 100/62 -- -- (!) 115 (!) 23 94 % --  02/05/22 1000 106/60 -- -- (!) 111 20 93 % --   Last fever 100.9 02/05/22 1232 Drain output 225 ml  Gen: NAD HENT: Normocephalic, atraumatic Lungs: Normal respiratory effort, no respiratory distress Heart: Regular rate noted Abdomen:  soft, moderate TTP, no rebound/guarding Cervix: Deferred Ext: Normal bulk and  tone, normal reflexes, no edema/cyanosis  Studies:    Latest Ref Rng & Units 02/06/2022    4:29 AM 02/05/2022    3:47 AM 02/04/2022    9:01 AM  CBC  WBC 4.0 - 10.5 K/uL 20.5  16.9  10.8    11.3   Hemoglobin 12.0 - 15.0 g/dL 40.1  02.7  25.3    66.4   Hematocrit 36.0 - 46.0 % 31.2  34.2  33.5    33.4   Platelets 150 - 400 K/uL 470  563  617    621       Latest Ref Rng & Units 02/06/2022    4:29 AM 02/05/2022    3:47 AM 02/04/2022    2:53 PM  CMP  Glucose 70 - 99 mg/dL 403  474  259   BUN 6 - 20 mg/dL 7  7  9    Creatinine 0.44 - 1.00 mg/dL  5.63  8.75   Sodium 135 - 145 mmol/L 133  134  137   Potassium 3.5 - 5.1 mmol/L 4.2  3.8  4.6   Chloride 98 - 111 mmol/L 94  100  106   CO2 22 - 32 mmol/L 26  22  19    Calcium 8.9 - 10.3 mg/dL 8.0  7.9  8.3   Total Protein 6.5 - 8.1 g/dL  6.6    Total Bilirubin 0.3 - 1.2 mg/dL  1.2    Alkaline Phos 38 - 126 U/L  74    AST 15 - 41 U/L  28  ALT 0 - 44 U/L  48     CT IMAGE GUIDED DRAINAGE BY PERCUTANEOUS CATHETER  Result Date: 02/05/2022 INDICATION: PID with large abdominopelvic fluid collection. EXAM: CT GUIDED DRAINAGE CATHETER PLACEMENT INTO ABDOMINOPELVIC FLUID COLLECTION RADIATION DOSE REDUCTION: This exam was performed according to the departmental dose-optimization program which includes automated exposure control, adjustment of the mA and/or kV according to patient size and/or use of iterative reconstruction technique. COMPARISON:  CT AP, 02/04/2022 MEDICATIONS: The patient is currently admitted to the hospital and receiving intravenous antibiotics. The antibiotics were administered within an appropriate time frame prior to the initiation of the procedure. ANESTHESIA/SEDATION: Moderate (conscious) sedation was employed during this procedure. A total of Versed 3 mg and Fentanyl 75 mcg was administered intravenously. Additionally, 1 mg Dilaudid was administered IV. Moderate Sedation Time: 19 minutes. The patient's level of consciousness  and vital signs were monitored continuously by radiology nursing throughout the procedure under my direct supervision. CONTRAST:  None COMPLICATIONS: None immediate. PROCEDURE: Informed written consent was obtained from the patient and/or patient's representative after a discussion of the risks, benefits and alternatives to treatment. The patient was placed supine on the CT gantry and a pre procedural CT was performed re-demonstrating the known abscess/fluid collection within the lower abdomen/pelvis. The procedure was planned. A timeout was performed prior to the initiation of the procedure. The RIGHT lower quadrant was prepped and draped in the usual sterile fashion. The overlying soft tissues were anesthetized with 1% lidocaine with epinephrine. Appropriate trajectory was planned with the use of a 22 gauge spinal needle. An 18 gauge trocar needle was advanced into the abscess/fluid collection and a short Amplatz super stiff wire was coiled within the collection. Appropriate positioning was confirmed with a limited CT scan. The tract was serially dilated allowing placement of a 12 [place] all-purpose drainage catheter. Appropriate positioning was confirmed with a limited postprocedural CT scan. 5 mL ml of serous fluid was aspirated and submitted for analysis. The tube was connected to a drainage bag and sutured in place. A dressing was placed. The patient tolerated the procedure well without immediate post procedural complication. IMPRESSION: Successful CT guided placement of a 12 Fr drainage catheter into the abdominopelvic collection with aspiration of serous fluid, as above. Samples were sent to the laboratory as requested by the ordering clinical team. Roanna Banning, MD [facility] Special[facility] Radiology Electronically Signed   By: Roanna Banning M.D.   On: 02/05/2022 18:08     Assessment & Plan:  31 y.o. G2P0010 admitted for sepsis secondary to pelvic abscess (12 x 8 cm  complex fluid collection noted in pelvis extending from the cul-de-sac superiorly to the cecum).  It could be a result of tuboovarian abscess process, likely originating from left ovary as seen on pelvic ultrasound.  She is s/p IR drainage on 02/05/22, doing better.  No gynecologic surgical intervention necessary for now. Continue analgesia as needed.  Continue Ceftriaxone/Doxycycline/Flagyl, GC/Chlam cultures still pending.   Will defer management of sepsis to the CCM team, appreciate their expertise.Talked to Dr. Kendrick Fries, patient is off pressors, and may be able to go the floor today.  She will be on our service, and can go to any Med-Surg floor (OBSC/1S or 6N preferred).     Appreciate care of ROXSANA RIDING by her primary team We will follow along with daily rounding on patient Please call [phone] [facility] OB/GYN Consult Attending Monday-Friday 8am - 5pm) or [phone] [facility] OB/GYN Attending On Call all day,  every day) for any gynecologic concerns at any time.  Thank you for involving [place] in the care of this patient.   Total consultation time including face-to-face time with patient (>50% of time), reviewing chart and documentation: 30 minutes    Jaynie Collins, MD, FACOG Obstetrician & Gynecologist, [facility] for [employer], [facility]

## 2022-02-07 DIAGNOSIS — N739 Female pelvic inflammatory disease, unspecified: Secondary | ICD-10-CM

## 2022-02-07 LAB — CBC
HCT: 28.5 % — ABNORMAL LOW (ref 36.0–46.0)
Hemoglobin: 9.5 g/dL — ABNORMAL LOW (ref 12.0–15.0)
MCH: 29.1 pg (ref 26.0–34.0)
MCHC: 33.3 g/dL (ref 30.0–36.0)
MCV: 87.4 fL (ref 80.0–100.0)
Platelets: 475 10*3/uL — ABNORMAL HIGH (ref 150–400)
RBC: 3.26 MIL/uL — ABNORMAL LOW (ref 3.87–5.11)
RDW: 15.1 % (ref 11.5–15.5)
WBC: 17.2 10*3/uL — ABNORMAL HIGH (ref 4.0–10.5)
nRBC: 0 % (ref 0.0–0.2)

## 2022-02-07 LAB — BASIC METABOLIC PANEL WITH GFR
Anion gap: 7 (ref 5–15)
BUN: 7 mg/dL (ref 6–20)
CO2: 27 mmol/L (ref 22–32)
Calcium: 7.7 mg/dL — ABNORMAL LOW (ref 8.9–10.3)
Chloride: 97 mmol/L — ABNORMAL LOW (ref 98–111)
Creatinine, Ser: 0.84 mg/dL (ref 0.44–1.00)
GFR, Estimated: >60 mL/min
Glucose, Bld: 108 mg/dL — ABNORMAL HIGH (ref 70–99)
Potassium: 3.5 mmol/L (ref 3.5–5.1)
Sodium: 131 mmol/L — ABNORMAL LOW (ref 135–145)

## 2022-02-07 LAB — GLUCOSE, CAPILLARY
Glucose-Capillary: 106 mg/dL — ABNORMAL HIGH (ref 70–99)
Glucose-Capillary: 101 mg/dL — ABNORMAL HIGH (ref 70–99)

## 2022-02-07 NOTE — Progress Notes (Signed)
   NAME:  Paula Lucas, MRN:  [id number], DOB:  [birth year], LOS: 3 ADMISSION DATE:  02/04/2022, CONSULTATION DATE:  6/17 REFERRING MD: 6/15, CHIEF COMPLAINT:  abdominal pain   History of Present Illness:  31 y/o female admitted for septic shock from PID, pelvic floor abscess.  Pertinent  Medical History  Hx r ovarian abscess  Chlamydia, Gonorrhea, BV  Undifferentiated painful skin lesions  Significant Hospital Events: Including procedures, antibiotic start and stop dates in addition to other pertinent events   6/14 [facility] presentation for severe abdominal pain, reportedly [redacted]wk pregnant but left [facility] 6/15 [facility] ED presentation for severe abdominal pain. Admitting to [facility] ICU for septic shock. CT ab/pelv> pelvic floor abscess 6/16 neuro consult: patient complaints are not congruent with any clear pathology, remains on pressors, IR drain placed  Interim History / Subjective:   Still not eating much  Objective   Blood pressure 114/67, pulse (!) 115, temperature 99.9 F (37.7 C), temperature source Oral, resp. rate 17, height 5\' 3"  (1.6 m), weight 76.9 kg, SpO2 97 %, unknown if currently breastfeeding.        Intake/Output Summary (Last 24 hours) at 02/07/2022 0727 Last data filed at 02/06/2022 1900 Gross per 24 hour  Intake 855 ml  Output 32 ml  Net 823 ml   Filed Weights   02/04/22 0847 02/06/22 0500  Weight: 77.1 kg 76.9 kg    Examination:  General:  Resting comfortably in bed HENT: NCAT OP clear PULM:  normal effort CV: acyanotic extremities GI: non distended MSK: normal bulk and tone Neuro: awake, alert, no distress, MAEW    Resolved Hospital Problem list     Assessment & Plan:  Septic shock from tubo-ovarian abscess, pyometrium, pelvic floor abscess> resolved Lactic acidosis> resolved AKI> resolved Shock liver > improved, mild Anemia, normocytic, mild, not bleeding Thrombocytosis: reactive MRSA UTI? With only 20k cfu/mL  Discussion: Continues to improve.  Renal function better, WBC down.   Plan: Continue IR drain, monitor output Move out of ICU, GYN Continue antibiotics as ordered> ceftriaxone, doxy, flagyl; if WBC continues to improve could consider stopping ceftriaxone Doxycycline cover for MRSA in urine, though I don't think this is a UTI based on low CFU Monitor drain culture and output Advance diet Out of bed  Best Practice (right click and "Reselect all SmartList Selections" daily)   Diet/type: Regular consistency (see orders) DVT prophylaxis: prophylactic heparin  GI prophylaxis: N/A Lines: N/A Foley:  N/A Code Status:  full code Last date of multidisciplinary goals of care discussion [on admission]  Critical care time: n/a     02/08/22, MD [facility] Pager: [phone] Cell: [phone] After 7:00 pm call Elink  [phone]

## 2022-02-07 NOTE — Progress Notes (Signed)
Reason for Consult:f/u IR drainage of pelvic abscess Referring Physician: Kendrick Fries MD  Paula Lucas is an 31 y.o. female.  G2P0010 No LMP recorded. (Menstrual status: Other). She is off pressors but still using [place] O2 due to periodically desaturating     Past Medical History:  Diagnosis Date   Asthma    BMI 31.0-31.9,adult    Chlamydia    Gonorrhea    TOA (tubo-ovarian abscess)     Past Surgical History:  Procedure Laterality Date   INDUCED ABORTION     IR RADIOLOGIST EVAL & MGMT  05/17/2018   TONSILLECTOMY      Family History  Problem Relation Age of Onset   Diabetes Mother    Hypertension Mother    Diabetes Father    Hypertension Father    Diabetes Sister    Hypertension Sister     Social History:  reports that she has been smoking cigarettes. She has a 4.50 pack-year smoking history. She has never used smokeless tobacco. She reports current drug use. Drug: Marijuana. She reports that she does not drink alcohol.  Allergies:  Allergies  Allergen Reactions   Fish Allergy Anaphylaxis    Severe allergic reaction to all seafoods   Motrin [Ibuprofen] Anaphylaxis    Pt tolerated Ketorolac 04/02/18   Mushroom Extract Complex Anaphylaxis   Shellfish-Derived Products Anaphylaxis   Tylenol [Acetaminophen] Anaphylaxis    Medications: I have reviewed the patient's current medications.  Review of Systems  Constitutional: Negative.   Respiratory: Negative.    Gastrointestinal:  Positive for abdominal pain and nausea. Negative for diarrhea and vomiting.  Genitourinary: Negative.     Blood pressure 114/67, pulse (!) 115, temperature 99.9 F (37.7 C), temperature source Oral, resp. rate 17, height 5\' 3"  (1.6 m), weight 76.9 kg, SpO2 97 %, unknown if currently breastfeeding. Physical Exam Vitals and nursing note reviewed. Exam conducted with a chaperone present.  Constitutional:      Appearance: She is well-developed. She is not ill-appearing.  Abdominal:     General:  Abdomen is flat.     Palpations: Abdomen is soft.     Tenderness: There is generalized abdominal tenderness.     Comments: Drain with minimal output in RLQ  Skin:    General: Skin is warm and dry.  Neurological:     Mental Status: She is alert.  Psychiatric:        Mood and Affect: Mood normal.     Results for orders placed or performed during the hospital encounter of 02/04/22 (from the past 48 hour(s))  Glucose, capillary     Status: Abnormal   Collection Time: 02/05/22  7:39 AM  Result Value Ref Range   Glucose-Capillary 118 (H) 70 - 99 mg/dL    Comment: Glucose reference range applies only to samples taken after fasting for at least 8 hours.  Glucose, capillary     Status: Abnormal   Collection Time: 02/05/22 12:31 PM  Result Value Ref Range   Glucose-Capillary 130 (H) 70 - 99 mg/dL    Comment: Glucose reference range applies only to samples taken after fasting for at least 8 hours.  Aerobic/Anaerobic Culture w Gram Stain (surgical/deep wound)     Status: None (Preliminary result)   Collection Time: 02/05/22 12:34 PM   Specimen: Fluid; Abscess  Result Value Ref Range   Specimen Description FLUID ABDOMEN    Special Requests NONE    Gram Stain      NO SQUAMOUS EPITHELIAL CELLS SEEN RARE WBC SEEN  NO ORGANISMS SEEN    Culture      NO GROWTH < 24 HOURS Performed at [facility], [address], [place], [place] [address]    Report Status PENDING   Glucose, capillary     Status: Abnormal   Collection Time: 02/05/22  3:34 PM  Result Value Ref Range   Glucose-Capillary 127 (H) 70 - 99 mg/dL    Comment: Glucose reference range applies only to samples taken after fasting for at least 8 hours.  Glucose, capillary     Status: Abnormal   Collection Time: 02/05/22  7:22 PM  Result Value Ref Range   Glucose-Capillary 103 (H) 70 - 99 mg/dL    Comment: Glucose reference range applies only to samples taken after fasting for at least 8 hours.  Glucose, capillary     Status:  Abnormal   Collection Time: 02/05/22 11:35 PM  Result Value Ref Range   Glucose-Capillary 132 (H) 70 - 99 mg/dL    Comment: Glucose reference range applies only to samples taken after fasting for at least 8 hours.  Glucose, capillary     Status: Abnormal   Collection Time: 02/06/22  3:22 AM  Result Value Ref Range   Glucose-Capillary 108 (H) 70 - 99 mg/dL    Comment: Glucose reference range applies only to samples taken after fasting for at least 8 hours.  CBC     Status: Abnormal   Collection Time: 02/06/22  4:29 AM  Result Value Ref Range   WBC 20.5 (H) 4.0 - 10.5 K/uL   RBC 3.53 (L) 3.87 - 5.11 MIL/uL   Hemoglobin 10.3 (L) 12.0 - 15.0 g/dL   HCT 84.6 (L) 96.2 - 95.2 %   MCV 88.4 80.0 - 100.0 fL   MCH 29.2 26.0 - 34.0 pg   MCHC 33.0 30.0 - 36.0 g/dL   RDW 84.1 32.4 - 40.1 %   Platelets 470 (H) 150 - 400 K/uL   nRBC 0.0 0.0 - 0.2 %    Comment: Performed at [facility], [address], [place], [place] [address]  Basic metabolic panel     Status: Abnormal   Collection Time: 02/06/22  4:29 AM  Result Value Ref Range   Sodium 133 (L) 135 - 145 mmol/L   Potassium 4.2 3.5 - 5.1 mmol/L   Chloride 94 (L) 98 - 111 mmol/L   CO2 26 22 - 32 mmol/L   Glucose, Bld 116 (H) 70 - 99 mg/dL    Comment: Glucose reference range applies only to samples taken after fasting for at least 8 hours.   BUN 7 6 - 20 mg/dL   Creatinine, Ser 3.66 0.44 - 1.00 mg/dL   Calcium 8.0 (L) 8.9 - 10.3 mg/dL   GFR, Estimated >44 >03 mL/min    Comment: (NOTE) Calculated using the CKD-EPI Creatinine Equation (2021)    Anion gap 13 5 - 15    Comment: Performed at [facility], [address], [place], [place] [address]  Magnesium     Status: Abnormal   Collection Time: 02/06/22  4:29 AM  Result Value Ref Range   Magnesium 2.5 (H) 1.7 - 2.4 mg/dL    Comment: Performed at [facility], [address], [place], [place] [address]  Phosphorus     Status: None   Collection Time: 02/06/22  4:29 AM   Result Value Ref Range   Phosphorus 3.3 2.5 - 4.6 mg/dL    Comment: Performed at  [facility], [place] [address] [place], [place] [address]  Glucose, capillary     Status: Abnormal   Collection Time: 02/06/22  7:28 AM  Result Value Ref Range   Glucose-Capillary 121 (H) 70 - 99 mg/dL    Comment: Glucose reference range applies only to samples taken after fasting for at least 8 hours.  Glucose, capillary     Status: Abnormal   Collection Time: 02/06/22 11:54 AM  Result Value Ref Range   Glucose-Capillary 111 (H) 70 - 99 mg/dL    Comment: Glucose reference range applies only to samples taken after fasting for at least 8 hours.  Glucose, capillary     Status: Abnormal   Collection Time: 02/06/22  3:40 PM  Result Value Ref Range   Glucose-Capillary 106 (H) 70 - 99 mg/dL    Comment: Glucose reference range applies only to samples taken after fasting for at least 8 hours.  Glucose, capillary     Status: Abnormal   Collection Time: 02/06/22  8:02 PM  Result Value Ref Range   Glucose-Capillary 123 (H) 70 - 99 mg/dL    Comment: Glucose reference range applies only to samples taken after fasting for at least 8 hours.  Glucose, capillary     Status: Abnormal   Collection Time: 02/06/22 11:57 PM  Result Value Ref Range   Glucose-Capillary 110 (H) 70 - 99 mg/dL    Comment: Glucose reference range applies only to samples taken after fasting for at least 8 hours.  CBC     Status: Abnormal   Collection Time: 02/07/22 12:15 AM  Result Value Ref Range   WBC 17.2 (H) 4.0 - 10.5 K/uL   RBC 3.26 (L) 3.87 - 5.11 MIL/uL   Hemoglobin 9.5 (L) 12.0 - 15.0 g/dL   HCT 28.5 (L) 36.0 - 46.0 %   MCV 87.4 80.0 - 100.0 fL   MCH 29.1 26.0 - 34.0 pg   MCHC 33.3 30.0 - 36.0 g/dL   RDW 15.1 11.5 - 15.5 %   Platelets 475 (H) 150 - 400 K/uL   nRBC 0.0 0.0 - 0.2 %    Comment: Performed at [facility], [place] [address], [place], [place] [id number]  Basic metabolic panel     Status: Abnormal   Collection  Time: 02/07/22 12:15 AM  Result Value Ref Range   Sodium 131 (L) 135 - 145 mmol/L   Potassium 3.5 3.5 - 5.1 mmol/L   Chloride 97 (L) 98 - 111 mmol/L   CO2 27 22 - 32 mmol/L   Glucose, Bld 108 (H) 70 - 99 mg/dL    Comment: Glucose reference range applies only to samples taken after fasting for at least 8 hours.   BUN 7 6 - 20 mg/dL   Creatinine, Ser 0.84 0.44 - 1.00 mg/dL   Calcium 7.7 (L) 8.9 - 10.3 mg/dL   GFR, Estimated >60 >60 mL/min    Comment: (NOTE) Calculated using the CKD-EPI Creatinine Equation (2021)    Anion gap 7 5 - 15    Comment: Performed at [facility] [address], [place],  [address]  Glucose, capillary     Status: Abnormal   Collection Time: 02/07/22  3:50 AM  Result Value Ref Range   Glucose-Capillary 106 (H) 70 - 99 mg/dL    Comment: Glucose reference range applies only to samples taken after fasting for at least 8 hours.     Assessment/Plan: Anticipate transfer to Med-Surg unit today  Emeterio Reeve 02/07/2022

## 2022-02-07 NOTE — Progress Notes (Signed)
Called to room by the patient with request to "take all this off", referring to monitor, IV, oxygen. Asked patient why she needed everything off and she stated her mother called and was on her way to pick the patient up. Attempted to calm patient and explain that the doctors did not feel she was ready to be discharged and that she would be moving out of the ICU to a regular floor bed. Also explained to the patient that she was still requiring supplemental oxygen to keep her o2 sats up., without the nasal cannula, patient quickly drops to the low 80's. Notified Dr.Mquaid that patient stated she was leaving. MD attempted to speak with the patient, however she was already at the unit doors asking to have the doors opened. She stated she did not want to talk any further and that she was leaving. Ensured that both IV's had been removed without complications at the sites and that monitor cords had been removed as well. Patient refused to sign AMA form and left the unit.

## 2022-02-07 NOTE — Discharge Summary (Signed)
Physician Discharge Summary         Patient ID: Paula Lucas MRN: [id number] DOB/AGE: [birth year] 31 y.o.  Admit date: 02/04/2022 Discharge date: 02/07/2022   THE PATIENT LEFT AGAINST MEDICAL ADVICE  Discharge Diagnoses:    Active Hospital Problems   Diagnosis Date Noted   Pelvic abscess     Septic shock (HCC) 02/04/2022    Resolved Hospital Problems  No resolved problems to display.      Discharge summary   31 y/o female admitted for septic shock due to a pelvic floor abscess, tubo-ovarian abscess.  She had multi-organ failure. Admitted to the ICU, treated with IV antibiotics and eventually a percutaneous drain placed by interventional radiology.  After drain placement the patient's blood pressure normalized.  On th day of admission she noted numbness in her legs.  Neurology was consulted and felt that her symptoms were not consistent any likely pathology other than confabulation.  During the hospital visit she was unwilling to engage in her therapy.  She had little motivation to eat or mobilize when she was encouraged to do so.  She had a urine culture positive for MRSA which was felt to be a contaminant as it only had 20k/cfu and her syndrome was explained by pelvic inflammatory disease.  Orders were written for her to transfer out of the ICU on 6/17 but because of hospital capacity issues she remained in the ICU. On 6/18 she told the nurses that she was leaving to go visit her father's grave.  She was alert and oriented and able to walk around on room air. By the time I came to the ICU to speak to her she walked away from me and left the unit rather than stopping to speak to me as I had requested.  I called her phone at this point to ask which pharmacy I could call in doxycycline and flagyl to a pharmacy, but she did not answer.  I left a message.  I also called her mother's phone but there was no answer.    Discharge Plan by Active Problems    Follow up with  PCP   Significant Hospital tests/ studies  CT ab/pelvis> pelvic floor abscess, cecal wall thickening  Procedures   Percutaneous drainage of the pelvic floor abscess  Culture data/antimicrobials   6/15 urine culture > MRSA 6/16 pelvic floor abscess drain > NGTD  Treated with ceftriaxone, doxycycline, flagyl during this admission   Consults  OB/GYN    Discharge Exam: BP 105/69 (BP Location: Left Arm)   Pulse (!) 121   Temp 100 F (37.8 C) (Oral)   Resp 20   Ht 5\' 3"  (1.6 m)   Wt 76.9 kg   SpO2 94%   Breastfeeding Unknown   BMI 30.03 kg/m   General:  Resting comfortably in bed HENT: NCAT OP clear PULM: CTA B, normal effort CV: RRR, no mgr GI: BS+, soft, nontender MSK: normal bulk and tone Neuro: awake, alert, no distress, MAEW  Labs at discharge   Lab Results  Component Value Date   CREATININE 0.84 02/07/2022   BUN 7 02/07/2022   NA 131 (L) 02/07/2022   K 3.5 02/07/2022   CL 97 (L) 02/07/2022   CO2 27 02/07/2022   Lab Results  Component Value Date   WBC 17.2 (H) 02/07/2022   HGB 9.5 (L) 02/07/2022   HCT 28.5 (L) 02/07/2022   MCV 87.4 02/07/2022   PLT 475 (H) 02/07/2022   Lab Results  Component Value  Date   ALT 48 (H) 02/05/2022   AST 28 02/05/2022   ALKPHOS 74 02/05/2022   BILITOT 1.2 02/05/2022   Lab Results  Component Value Date   INR 1.2 02/04/2022   INR 1.29 06/20/2018   INR 1.09 04/03/2018    Current radiological studies    No results found.  Disposition:      recommended she not leave the hospital, she refused   Follow-up appointment   prn Discharge Condition:   guarded   Signed: Heber Raemon 02/07/2022, 3:21 PM

## 2022-02-08 ENCOUNTER — Inpatient Hospital Stay (HOSPITAL_COMMUNITY)
Admission: EM | Admit: 2022-02-08 | Discharge: 2022-02-28 | DRG: 853 | Disposition: A | Discharge status: Rehabilitation Facility | Payer: Medicaid Other | Attending: Internal Medicine | Admitting: Internal Medicine

## 2022-02-08 ENCOUNTER — Emergency Department (HOSPITAL_COMMUNITY): Payer: Medicaid Other

## 2022-02-08 ENCOUNTER — Encounter (HOSPITAL_COMMUNITY): Payer: Self-pay

## 2022-02-08 DIAGNOSIS — E669 Obesity, unspecified: Secondary | ICD-10-CM | POA: Diagnosis present

## 2022-02-08 DIAGNOSIS — R519 Headache, unspecified: Secondary | ICD-10-CM | POA: Diagnosis not present

## 2022-02-08 DIAGNOSIS — R188 Other ascites: Secondary | ICD-10-CM | POA: Diagnosis present

## 2022-02-08 DIAGNOSIS — J810 Acute pulmonary edema: Secondary | ICD-10-CM | POA: Diagnosis not present

## 2022-02-08 DIAGNOSIS — B3731 Acute candidiasis of vulva and vagina: Secondary | ICD-10-CM | POA: Diagnosis present

## 2022-02-08 DIAGNOSIS — Z8719 Personal history of other diseases of the digestive system: Secondary | ICD-10-CM

## 2022-02-08 DIAGNOSIS — Z22322 Carrier or suspected carrier of Methicillin resistant Staphylococcus aureus: Secondary | ICD-10-CM

## 2022-02-08 DIAGNOSIS — Z91013 Allergy to seafood: Secondary | ICD-10-CM

## 2022-02-08 DIAGNOSIS — E43 Unspecified severe protein-calorie malnutrition: Secondary | ICD-10-CM | POA: Diagnosis present

## 2022-02-08 DIAGNOSIS — F32A Depression, unspecified: Secondary | ICD-10-CM | POA: Diagnosis not present

## 2022-02-08 DIAGNOSIS — Z91148 Patient's other noncompliance with medication regimen for other reason: Secondary | ICD-10-CM

## 2022-02-08 DIAGNOSIS — Z886 Allergy status to analgesic agent status: Secondary | ICD-10-CM

## 2022-02-08 DIAGNOSIS — M546 Pain in thoracic spine: Secondary | ICD-10-CM | POA: Diagnosis not present

## 2022-02-08 DIAGNOSIS — K750 Abscess of liver: Secondary | ICD-10-CM | POA: Diagnosis present

## 2022-02-08 DIAGNOSIS — D509 Iron deficiency anemia, unspecified: Secondary | ICD-10-CM | POA: Diagnosis present

## 2022-02-08 DIAGNOSIS — Z9102 Food additives allergy status: Secondary | ICD-10-CM

## 2022-02-08 DIAGNOSIS — A4102 Sepsis due to Methicillin resistant Staphylococcus aureus: Principal | ICD-10-CM | POA: Diagnosis present

## 2022-02-08 DIAGNOSIS — Z6830 Body mass index (BMI) 30.0-30.9, adult: Secondary | ICD-10-CM

## 2022-02-08 DIAGNOSIS — F209 Schizophrenia, unspecified: Secondary | ICD-10-CM | POA: Diagnosis present

## 2022-02-08 DIAGNOSIS — K651 Peritoneal abscess: Secondary | ICD-10-CM | POA: Diagnosis present

## 2022-02-08 DIAGNOSIS — N739 Female pelvic inflammatory disease, unspecified: Secondary | ICD-10-CM

## 2022-02-08 DIAGNOSIS — R61 Generalized hyperhidrosis: Secondary | ICD-10-CM | POA: Diagnosis not present

## 2022-02-08 DIAGNOSIS — J9601 Acute respiratory failure with hypoxia: Secondary | ICD-10-CM | POA: Diagnosis not present

## 2022-02-08 DIAGNOSIS — R8271 Bacteriuria: Secondary | ICD-10-CM | POA: Diagnosis present

## 2022-02-08 DIAGNOSIS — Z888 Allergy status to other drugs, medicaments and biological substances status: Secondary | ICD-10-CM

## 2022-02-08 DIAGNOSIS — E876 Hypokalemia: Secondary | ICD-10-CM | POA: Diagnosis present

## 2022-02-08 DIAGNOSIS — R1032 Left lower quadrant pain: Secondary | ICD-10-CM

## 2022-02-08 DIAGNOSIS — E8770 Fluid overload, unspecified: Secondary | ICD-10-CM | POA: Diagnosis not present

## 2022-02-08 DIAGNOSIS — F17210 Nicotine dependence, cigarettes, uncomplicated: Secondary | ICD-10-CM | POA: Diagnosis present

## 2022-02-08 DIAGNOSIS — A419 Sepsis, unspecified organism: Secondary | ICD-10-CM | POA: Diagnosis present

## 2022-02-08 DIAGNOSIS — K660 Peritoneal adhesions (postprocedural) (postinfection): Secondary | ICD-10-CM | POA: Diagnosis present

## 2022-02-08 DIAGNOSIS — D75839 Thrombocytosis, unspecified: Secondary | ICD-10-CM | POA: Diagnosis present

## 2022-02-08 DIAGNOSIS — J90 Pleural effusion, not elsewhere classified: Secondary | ICD-10-CM | POA: Diagnosis present

## 2022-02-08 DIAGNOSIS — R509 Fever, unspecified: Secondary | ICD-10-CM | POA: Diagnosis not present

## 2022-02-08 DIAGNOSIS — F319 Bipolar disorder, unspecified: Secondary | ICD-10-CM | POA: Diagnosis present

## 2022-02-08 DIAGNOSIS — E46 Unspecified protein-calorie malnutrition: Secondary | ICD-10-CM | POA: Diagnosis present

## 2022-02-08 LAB — URINALYSIS, ROUTINE W REFLEX MICROSCOPIC
Bilirubin Urine: NEGATIVE
Glucose, UA: NEGATIVE mg/dL
Ketones, ur: 80 mg/dL — AB
Leukocytes,Ua: NEGATIVE
Nitrite: NEGATIVE
Protein, ur: 100 mg/dL — AB
RBC / HPF: >50 RBC/hpf — ABNORMAL HIGH (ref 0–5)
Specific Gravity, Urine: >1.046 — ABNORMAL HIGH (ref 1.005–1.030)
pH: 7 (ref 5.0–8.0)

## 2022-02-08 LAB — COMPREHENSIVE METABOLIC PANEL
ALT: 30 U/L (ref 0–44)
AST: 32 U/L (ref 15–41)
Albumin: 2 g/dL — ABNORMAL LOW (ref 3.5–5.0)
Alkaline Phosphatase: 71 U/L (ref 38–126)
Anion gap: 14 (ref 5–15)
BUN: 6 mg/dL (ref 6–20)
CO2: 28 mmol/L (ref 22–32)
Calcium: 8.6 mg/dL — ABNORMAL LOW (ref 8.9–10.3)
Chloride: 93 mmol/L — ABNORMAL LOW (ref 98–111)
Creatinine, Ser: 0.71 mg/dL (ref 0.44–1.00)
GFR, Estimated: >60 mL/min (ref >60)
Glucose, Bld: 103 mg/dL — ABNORMAL HIGH (ref 70–99)
Potassium: 3.3 mmol/L — ABNORMAL LOW (ref 3.5–5.1)
Sodium: 135 mmol/L (ref 135–145)
Total Bilirubin: 0.7 mg/dL (ref 0.3–1.2)
Total Protein: 7.1 g/dL (ref 6.5–8.1)

## 2022-02-08 LAB — CBC WITH DIFFERENTIAL/PLATELET
Abs Immature Granulocytes: 0.14 10*3/uL — ABNORMAL HIGH (ref 0.00–0.07)
Basophils Absolute: 0.1 10*3/uL (ref 0.0–0.1)
Basophils Relative: 0 %
Eosinophils Absolute: 0 10*3/uL (ref 0.0–0.5)
Eosinophils Relative: 0 %
HCT: 30.7 % — ABNORMAL LOW (ref 36.0–46.0)
Hemoglobin: 10.1 g/dL — ABNORMAL LOW (ref 12.0–15.0)
Immature Granulocytes: 1 %
Lymphocytes Relative: 13 %
Lymphs Abs: 2.2 10*3/uL (ref 0.7–4.0)
MCH: 29 pg (ref 26.0–34.0)
MCHC: 32.9 g/dL (ref 30.0–36.0)
MCV: 88.2 fL (ref 80.0–100.0)
Monocytes Absolute: 1.1 10*3/uL — ABNORMAL HIGH (ref 0.1–1.0)
Monocytes Relative: 6 %
Neutro Abs: 13.4 10*3/uL — ABNORMAL HIGH (ref 1.7–7.7)
Neutrophils Relative %: 80 %
Platelets: 518 10*3/uL — ABNORMAL HIGH (ref 150–400)
RBC: 3.48 MIL/uL — ABNORMAL LOW (ref 3.87–5.11)
RDW: 15 % (ref 11.5–15.5)
WBC: 16.9 10*3/uL — ABNORMAL HIGH (ref 4.0–10.5)
nRBC: 0 % (ref 0.0–0.2)

## 2022-02-08 LAB — LACTIC ACID, PLASMA: Lactic Acid, Venous: 1.4 mmol/L (ref 0.5–1.9)

## 2022-02-08 LAB — I-STAT BETA HCG BLOOD, ED (MC, WL, AP ONLY): I-stat hCG, quantitative: <5 m[IU]/mL (ref <5)

## 2022-02-08 LAB — GLUCOSE, CAPILLARY: Glucose-Capillary: 108 mg/dL — ABNORMAL HIGH (ref 70–99)

## 2022-02-08 MED ORDER — LACTATED RINGERS IV BOLUS (SEPSIS)
1000.0000 mL | Freq: Once | INTRAVENOUS | Status: AC
Start: 2022-02-08 — End: 2022-02-08
  Administered 2022-02-08: 1000 mL via INTRAVENOUS

## 2022-02-08 MED ORDER — SODIUM CHLORIDE 0.9 % IV SOLN
2.0000 g | Freq: Once | INTRAVENOUS | Status: AC
  Administered 2022-02-08: 2 g via INTRAVENOUS
  Filled 2022-02-08: qty 12.5

## 2022-02-08 MED ORDER — ENOXAPARIN SODIUM 40 MG/0.4ML IJ SOSY: 40.0000 mg | PREFILLED_SYRINGE | INTRAMUSCULAR | Status: DC

## 2022-02-08 MED ORDER — IOHEXOL 300 MG/ML  SOLN
100.0000 mL | Freq: Once | INTRAMUSCULAR | Status: AC | PRN
  Administered 2022-02-08: 100 mL via INTRATHECAL

## 2022-02-08 MED ORDER — ONDANSETRON HCL 4 MG PO TABS
4.0000 mg | ORAL_TABLET | Freq: Four times a day (QID) | ORAL | Status: DC | PRN
  Administered 2022-02-13: 4 mg via ORAL
  Filled 2022-02-08 (×3): qty 1

## 2022-02-08 MED ORDER — ONDANSETRON HCL 4 MG/2ML IJ SOLN
4.0000 mg | Freq: Once | INTRAMUSCULAR | Status: AC
  Administered 2022-02-08: 4 mg via INTRAVENOUS
  Filled 2022-02-08: qty 2

## 2022-02-08 MED ORDER — KETOROLAC TROMETHAMINE 15 MG/ML IJ SOLN
30.0000 mg | Freq: Four times a day (QID) | INTRAMUSCULAR | Status: DC | PRN
  Administered 2022-02-08 – 2022-02-09 (×2): 30 mg via INTRAVENOUS
  Filled 2022-02-08 (×2): qty 2

## 2022-02-08 MED ORDER — ADULT MULTIVITAMIN W/MINERALS CH
1.0000 | ORAL_TABLET | Freq: Every day | ORAL | Status: DC
  Administered 2022-02-08 – 2022-02-20 (×12): 1 via ORAL
  Filled 2022-02-08 (×13): qty 1

## 2022-02-08 MED ORDER — LACTATED RINGERS IV BOLUS (SEPSIS)
500.0000 mL | Freq: Once | INTRAVENOUS | Status: AC
Start: 2022-02-08 — End: 2022-02-08
  Administered 2022-02-08: 500 mL via INTRAVENOUS

## 2022-02-08 MED ORDER — LACTATED RINGERS IV SOLN: INTRAVENOUS | Status: AC

## 2022-02-08 MED ORDER — HYDROMORPHONE HCL 1 MG/ML IJ SOLN
1.0000 mg | INTRAMUSCULAR | Status: DC | PRN
  Administered 2022-02-08 – 2022-02-09 (×6): 1 mg via INTRAVENOUS
  Filled 2022-02-08 (×6): qty 1

## 2022-02-08 MED ORDER — SODIUM CHLORIDE 0.9% FLUSH
3.0000 mL | Freq: Two times a day (BID) | INTRAVENOUS | Status: DC
  Administered 2022-02-08 – 2022-02-19 (×13): 3 mL via INTRAVENOUS

## 2022-02-08 MED ORDER — HYDROMORPHONE HCL 1 MG/ML IJ SOLN
1.0000 mg | Freq: Once | INTRAMUSCULAR | Status: AC
  Administered 2022-02-08: 1 mg via INTRAVENOUS
  Filled 2022-02-08: qty 1

## 2022-02-08 MED ORDER — SODIUM CHLORIDE 0.9 % IV SOLN
2.0000 g | Freq: Three times a day (TID) | INTRAVENOUS | Status: DC
  Administered 2022-02-08 – 2022-02-09 (×3): 2 g via INTRAVENOUS
  Filled 2022-02-08 (×3): qty 12.5

## 2022-02-08 MED ORDER — HYDROMORPHONE HCL 1 MG/ML IJ SOLN
1.0000 mg | INTRAMUSCULAR | Status: DC | PRN
  Administered 2022-02-08: 1 mg via INTRAVENOUS
  Filled 2022-02-08: qty 1

## 2022-02-08 MED ORDER — ONDANSETRON HCL 4 MG/2ML IJ SOLN
4.0000 mg | Freq: Four times a day (QID) | INTRAMUSCULAR | Status: DC | PRN
  Administered 2022-02-13 – 2022-02-27 (×22): 4 mg via INTRAVENOUS
  Filled 2022-02-08 (×24): qty 2

## 2022-02-08 MED ORDER — POLYETHYLENE GLYCOL 3350 17 G PO PACK: 17.0000 g | PACK | Freq: Every day | ORAL | Status: DC | PRN

## 2022-02-08 MED ORDER — SODIUM CHLORIDE 0.9 % IV SOLN
100.0000 mg | Freq: Two times a day (BID) | INTRAVENOUS | Status: DC
  Administered 2022-02-08 – 2022-02-11 (×7): 100 mg via INTRAVENOUS
  Filled 2022-02-08 (×10): qty 100

## 2022-02-08 MED ORDER — METRONIDAZOLE 500 MG/100ML IV SOLN
500.0000 mg | Freq: Three times a day (TID) | INTRAVENOUS | Status: DC
  Administered 2022-02-08 – 2022-02-09 (×3): 500 mg via INTRAVENOUS
  Filled 2022-02-08 (×3): qty 100

## 2022-02-08 MED ORDER — KETOROLAC TROMETHAMINE 15 MG/ML IJ SOLN
15.0000 mg | Freq: Four times a day (QID) | INTRAMUSCULAR | Status: DC | PRN
  Administered 2022-02-08: 15 mg via INTRAVENOUS
  Filled 2022-02-08: qty 1

## 2022-02-08 MED ORDER — METRONIDAZOLE 500 MG/100ML IV SOLN
500.0000 mg | Freq: Once | INTRAVENOUS | Status: AC
  Administered 2022-02-08: 500 mg via INTRAVENOUS
  Filled 2022-02-08: qty 100

## 2022-02-08 NOTE — Progress Notes (Signed)
Elink following code sepsis °

## 2022-02-08 NOTE — Progress Notes (Signed)
Referring Physician(s): Ernie Avena, MD  Supervising Physician: Ruel Favors  Patient Status:  [facility] - In-pt  Chief Complaint:  PID s/p large abdominopelvic fluid collection drain placement 02/05/22 with Dr. Milford Cage 02/05/22  Subjective:  Pt sleeping in bed. She is A&O. Pt c/o pain/tenderness at insertion site at all times, worse with palpation/movement. Pt endorses N/V and fevers.   Allergies: Fish allergy, Motrin [ibuprofen], Mushroom extract complex, Shellfish-derived products, and Tylenol [acetaminophen]  Medications: Prior to Admission medications   Medication Sig Start Date End Date Taking? Authorizing Provider  doxycycline (VIBRAMYCIN) 100 MG capsule Take 1 capsule (100 mg total) by mouth 2 (two) times daily. Patient not taking: Reported on Feb 07, 2022 08/04/20   Liberty Handy, PA-C  hydrOXYzine (ATARAX/VISTARIL) 25 MG tablet Take 1 tablet (25 mg total) by mouth every 6 (six) hours. Patient not taking: Reported on 2022-02-07 08/04/20   Liberty Handy, PA-C  methylPREDNISolone (MEDROL DOSEPAK) 4 MG TBPK tablet Follow package insert Patient not taking: Reported on February 07, 2022 08/10/21   Tanda Rockers, PA-C  penicillin v potassium (VEETID) 500 MG tablet Take 1 tablet (500 mg total) by mouth 3 (three) times daily. Patient not taking: Reported on 02-07-2022 05/24/19   Mardella Layman, MD  permethrin (ELIMITE) 5 % cream Apply to affected area once Patient not taking: Reported on Feb 07, 2022 08/04/20   Liberty Handy, PA-C  traMADol (ULTRAM) 50 MG tablet Take 1 tablet (50 mg total) by mouth every 6 (six) hours as needed. Patient not taking: Reported on Feb 07, 2022 05/25/19   Eustace Moore, MD     Vital Signs: BP 116/79   Pulse (!) 103   Temp 99.7 F (37.6 C) (Oral)   Resp (!) 24   SpO2 99%   Physical Exam Vitals reviewed.  Constitutional:      General: She is not in acute distress.    Appearance: Normal appearance. She is not ill-appearing.  HENT:      Head: Normocephalic and atraumatic.  Eyes:     Extraocular Movements: Extraocular movements intact.     Pupils: Pupils are equal, round, and reactive to light.  Cardiovascular:     Rate and Rhythm: Tachycardia present.  Pulmonary:     Effort: Pulmonary effort is normal. No respiratory distress.     Breath sounds: Normal breath sounds.  Abdominal:     Comments: RLQ drain insertion site unremarkable with sutures/statlock in place. ~50 cc clear, yellow fluid in gravity bag. Drain flushes easily, does not aspirate. Dressing C/D/I  Skin:    General: Skin is warm and dry.  Neurological:     Mental Status: She is alert and oriented to person, place, and time.     Imaging: CT ABDOMEN PELVIS W CONTRAST  Result Date: 02/08/2022 CLINICAL DATA:  Sepsis EXAM: CT ABDOMEN AND PELVIS WITH CONTRAST TECHNIQUE: Multidetector CT imaging of the abdomen and pelvis was performed using the standard protocol following bolus administration of intravenous contrast. RADIATION DOSE REDUCTION: This exam was performed according to the departmental dose-optimization program which includes automated exposure control, adjustment of the mA and/or kV according to patient size and/or use of iterative reconstruction technique. CONTRAST:  OMNIPAQUE IOHEXOL 300 MG/ML  SOLN COMPARISON:  Previous studies including the CT done on 2022-02-07 FINDINGS: Lower chest: Small bilateral pleural effusions are seen more so on the right side. There are linear patchy infiltrates in the lingula and both lower lobes suggesting atelectasis/pneumonia. Hepatobiliary: No focal abnormality is seen in the liver. There is no dilation  of bile ducts. Gallbladder is unremarkable. Small ascites is noted in the perihepatic region. Pancreas: No focal abnormality is seen. Spleen: Unremarkable. Adrenals/Urinary Tract: Adrenals are not enlarged. There is no hydronephrosis. There are no renal or ureteral stones. Urinary bladder is unremarkable. There is 3 mm  low-density in the midportion of left kidney which is too small to be characterized. Stomach/Bowel: Stomach is not distended. Small bowel loops are not dilated. Appendix is not seen. There is no significant wall thickening in the colon. There is interval placement of percutaneous drainage catheter with its tip in the right lower abdomen. There is interval decrease in size of the complex fluid collection in the right lower quadrant. There is 4 cm loculated fluid collection slightly to the left of midline at the level of pelvic inlet. There is 7.2 x 5.3 x 4.9 cm loculated fluid collection with minimally enhancing margin in the cul-de-sac in the pelvis. There are no pockets of air within these loculated fluid collections. There is stranding in the fat planes in the lower abdomen and pelvis. Vascular/Lymphatic: Vascular structures are unremarkable. Reproductive: Uterus is to the right of midline. Other: There is no pneumoperitoneum. Musculoskeletal: No acute findings are seen. IMPRESSION: Interval placement of percutaneous drainage catheter within air-fluid collection in the right lower abdomen with interval decrease in size. There is 7.2 cm loculated fluid collection in the cul-de-sac in the pelvis with slightly enhancing margins. There is another 4 cm loculated fluid collection in the left side of the pelvis. These may suggest loculated serous fluid collections or abscesses. Small ascites is noted in the perihepatic region. There is no evidence of intestinal obstruction or pneumoperitoneum. There is no hydronephrosis. Small bilateral pleural effusions. There are linear patchy infiltrates in both lower lung fields suggesting atelectasis/pneumonia. Electronically Signed   By: Ernie Avena M.D.   On: 02/08/2022 09:41   CT IMAGE GUIDED DRAINAGE BY PERCUTANEOUS CATHETER  Result Date: 02/05/2022 INDICATION: PID with large abdominopelvic fluid collection. EXAM: CT GUIDED DRAINAGE CATHETER PLACEMENT INTO  ABDOMINOPELVIC FLUID COLLECTION RADIATION DOSE REDUCTION: This exam was performed according to the departmental dose-optimization program which includes automated exposure control, adjustment of the mA and/or kV according to patient size and/or use of iterative reconstruction technique. COMPARISON:  CT AP, 02/04/2022 MEDICATIONS: The patient is currently admitted to the hospital and receiving intravenous antibiotics. The antibiotics were administered within an appropriate time frame prior to the initiation of the procedure. ANESTHESIA/SEDATION: Moderate (conscious) sedation was employed during this procedure. A total of Versed 3 mg and Fentanyl 75 mcg was administered intravenously. Additionally, 1 mg Dilaudid was administered IV. Moderate Sedation Time: 19 minutes. The patient's level of consciousness and vital signs were monitored continuously by radiology nursing throughout the procedure under my direct supervision. CONTRAST:  None COMPLICATIONS: None immediate. PROCEDURE: Informed written consent was obtained from the patient and/or patient's representative after a discussion of the risks, benefits and alternatives to treatment. The patient was placed supine on the CT gantry and a pre procedural CT was performed re-demonstrating the known abscess/fluid collection within the lower abdomen/pelvis. The procedure was planned. A timeout was performed prior to the initiation of the procedure. The RIGHT lower quadrant was prepped and draped in the usual sterile fashion. The overlying soft tissues were anesthetized with 1% lidocaine with epinephrine. Appropriate trajectory was planned with the use of a 22 gauge spinal needle. An 18 gauge trocar needle was advanced into the abscess/fluid collection and a short Amplatz super stiff wire was coiled within the  collection. Appropriate positioning was confirmed with a limited CT scan. The tract was serially dilated allowing placement of a 12 [place] all-purpose drainage  catheter. Appropriate positioning was confirmed with a limited postprocedural CT scan. 5 mL ml of serous fluid was aspirated and submitted for analysis. The tube was connected to a drainage bag and sutured in place. A dressing was placed. The patient tolerated the procedure well without immediate post procedural complication. IMPRESSION: Successful CT guided placement of a 12 Fr drainage catheter into the abdominopelvic collection with aspiration of serous fluid, as above. Samples were sent to the laboratory as requested by the ordering clinical team. Roanna Banning, MD [facility] Radiology Electronically Signed   By: Roanna Banning M.D.   On: 02/05/2022 18:08   CT ABDOMEN PELVIS WO CONTRAST  Result Date: 02/04/2022 CLINICAL DATA:  Abdominal pain, acute, nonlocalized septic shock, abnormal uterine US, concern for PID EXAM: CT ABDOMEN AND PELVIS WITHOUT CONTRAST TECHNIQUE: Multidetector CT imaging of the abdomen and pelvis was performed following the standard protocol without IV contrast. RADIATION DOSE REDUCTION: This exam was performed according to the departmental dose-optimization program which includes automated exposure control, adjustment of the mA and/or kV according to patient size and/or use of iterative reconstruction technique. COMPARISON:  Pelvic ultrasound today FINDINGS: Lower chest: Pleural thickening noted peripherally in the right lower hemithorax, possibly representing trace loculated pleural effusion. No confluent airspace opacities. Hepatobiliary: No focal hepatic abnormality. Gallbladder unremarkable. Pancreas: No focal abnormality or ductal dilatation. Spleen: No focal abnormality.  Normal size. Adrenals/Urinary Tract: No adrenal abnormality. No focal renal abnormality. No stones or hydronephrosis. Urinary bladder is unremarkable. Stomach/Bowel: Wall thickening noted in the cecum which is immediately adjacent to the large pelvic fluid collection  described below. This could reflect colitis or be secondarily inflamed. Stomach and small bowel grossly unremarkable. Vascular/Lymphatic: No evidence of aneurysm or adenopathy. Reproductive: Large complex cystic area or fluid collection seen in the pelvis extending from the cul-de-sac superiorly into the right lower quadrant adjacent to the cecum. This measures approximately 12 x 8 cm on image 64 of series 3. Internal areas of increased density. This could reflect blood or abscess/PID. Uterus grossly unremarkable on this noncontrast study. Ovaries not definitively visualized. Other: There is free fluid adjacent to the liver and spleen. Musculoskeletal: No acute bony abnormality. IMPRESSION: Complex process in the pelvis including what appears to be a large complex fluid collection measuring 12 x 8 cm extending from the cul-de-sac superiorly to the cecum which could reflect blood products or abscess. This also could conceivably be related to and ovary. Neither ovary definitively identified. Wall thickening in the cecum could reflect colitis or secondary inflammation related to the pelvic process. Perihepatic and perisplenic ascites. Possible trace loculated right pleural effusion peripherally in the right lower hemithorax. Electronically Signed   By: Charlett Nose M.D.   On: 02/04/2022 22:29    Labs:  CBC: Recent Labs    02/05/22 0347 02/06/22 0429 02/07/22 0015 02/08/22 0106  WBC 16.9* 20.5* 17.2* 16.9*  HGB 11.2* 10.3* 9.5* 10.1*  HCT 34.2* 31.2* 28.5* 30.7*  PLT 563* 470* 475* 518*    COAGS: Recent Labs    02/04/22 0901  INR 1.2  APTT 25    BMP: Recent Labs    02/05/22 0347 02/06/22 0429 02/07/22 0015 02/08/22 0106  NA 134* 133* 131* 135  K 3.8 4.2 3.5 3.3*  CL 100 94* 97* 93*  CO2 22 26 27 28   GLUCOSE 126*  116* 108* 103*  BUN 7 7 7 6   CALCIUM 7.9* 8.0* 7.7* 8.6*  CREATININE 1.16* 0.89 0.84 0.71  GFRNONAA >60 >60 >60 >60    LIVER FUNCTION TESTS: Recent Labs     08/09/21 1523 02/04/22 0901 02/05/22 0347 02/08/22 0106  BILITOT 0.9 1.8* 1.2 0.7  AST 28 60* 28 32  ALT 15 78* 48* 30  ALKPHOS 49 90 74 71  PROT 7.5 7.8 6.6 7.1  ALBUMIN 4.2 2.7* 2.0* 2.0*    Assessment and Plan: PID s/p large abdominopelvic fluid collection drain placement 02/05/22 with Dr. 02/07/22 02/05/22.   Pt left AMA from ICU 02/07/22 and returned to ED 02/08/22 for abdominal pain related to RLQ drain.   RLQ drain insertion site unremarkable with sutures/statlock in place. ~50 cc clear, yellow fluid in gravity bag. Drain flushes easily, does not aspirate. Dressing C/D/I. Pt endorses pain when flushing.   Removed gravity bag and replaced with JP drain.   WBC 16.9 (17.2) 99.7 Cx after 3 days, results rending:    NO SQUAMOUS EPITHELIAL CELLS SEEN  RARE WBC SEEN  NO ORGANISMS SEEN   Culture NO GROWTH 3 DAYS NO ANAEROBES ISOLATED; CULTURE IN PROGRESS FOR 5 DAYS     Continue documenting OP in Epic q shift.  Continue flushing TID.  Change dressing q shift or as needed.  Please call IR if difficulty flushing or sudden change in output.     Electronically Signed: 02/10/22, NP 02/08/2022, 3:07 PM   I spent a total of 15 Minutes at the the patient's bedside AND on the patient's hospital floor or unit, greater than 50% of which was counseling/coordinating care for RLQ abdominopelvic fluid collection.

## 2022-02-08 NOTE — Progress Notes (Signed)
Pharmacy Antibiotic Note  Paula Lucas is a 31 y.o. female admitted on 02/08/2022 with sepsis - recently admitted with septic shock due to pelvic floor abscess, tubo-ovarian abscess with multi-organ failure. S/p IV antibiotics and perc drain placement by IR. UCx positive for MRSA felt to be contaminant. Patient left AMA 6/18, returns to ER 6/19.  Pharmacy has been consulted for cefepime dosing. Also with 1x doses of doxy and metronidazole ordered per EDP. SCr stable 0.71 today.  Plan: Cefepime 2g IV q8h Doxy 100mg  IV x 1 and metronidazole 500mg  IV x 1 ordered per EDP - f/u plans to continue Monitor clinical progress, c/s, renal function F/u de-escalation plan/LOT      Temp (24hrs), Avg:99.5 F (37.5 C), Min:98.7 F (37.1 C), Max:100 F (37.8 C)  Recent Labs  Lab 02/04/22 0901 02/04/22 1143 02/04/22 1453 02/05/22 0347 02/06/22 0429 02/07/22 0015 02/08/22 0106  WBC 11.3*  10.8*  --   --  16.9* 20.5* 17.2* 16.9*  CREATININE 1.52*  --  1.03* 1.16* 0.89 0.84 0.71  LATICACIDVEN 2.7* 1.3  --   --   --   --  1.4    Estimated Creatinine Clearance: 101.9 mL/min (by C-G formula based on SCr of 0.71 mg/dL).    Allergies  Allergen Reactions   Fish Allergy Anaphylaxis    Severe allergic reaction to all seafoods   Motrin [Ibuprofen] Anaphylaxis    Pt tolerated Ketorolac 04/02/18   Mushroom Extract Complex Anaphylaxis   Shellfish-Derived Products Anaphylaxis   Tylenol [Acetaminophen] Anaphylaxis    02/10/22, PharmD, BCPS Please check AMION for all [facility] contact numbers Clinical Pharmacist 02/08/2022 8:26 AM

## 2022-02-08 NOTE — ED Provider Notes (Signed)
[facility] EMERGENCY DEPARTMENT Provider Note   CSN: [id number] Arrival date & time: 02/08/22  0046     History  Chief Complaint  Patient presents with   Abdominal Pain    Paula Lucas is a 31 y.o. female.   Abdominal Pain Associated symptoms: chills and nausea     31 year old female with medical history significant for septic shock and multisystem organ failure from a tubo-ovarian abscess, treated with percutaneous drain via IR, left from the ICU yesterday 02/07/2022 who represents to the emergency department with worsening abdominal pain, nausea, vomiting.  The patient represents to the emergency department with concern for sepsis in the setting of ongoing infection associated with her PID and tubo-ovarian abscess.  She states that she left AMA from the ICU yesterday because it was Father's Day and she wanted to visit her father's grave.  Since the antibiotics and pain medicine stopped, the patient has since had worsening abdominal pain diffusely throughout the abdomen with chills.  On arrival, the patient was afebrile, tachycardic P118, normotensive.  Home Medications Prior to Admission medications   Medication Sig Start Date End Date Taking? Authorizing Provider  doxycycline (VIBRAMYCIN) 100 MG capsule Take 1 capsule (100 mg total) by mouth 2 (two) times daily. Patient not taking: Reported on 02/04/2022 08/04/20   Kinnie Feil, PA-C  hydrOXYzine (ATARAX/VISTARIL) 25 MG tablet Take 1 tablet (25 mg total) by mouth every 6 (six) hours. Patient not taking: Reported on 02/04/2022 08/04/20   Kinnie Feil, PA-C  methylPREDNISolone (MEDROL DOSEPAK) 4 MG TBPK tablet Follow package insert Patient not taking: Reported on 02/04/2022 08/10/21   Eustaquio Maize, PA-C  penicillin v potassium (VEETID) 500 MG tablet Take 1 tablet (500 mg total) by mouth 3 (three) times daily. Patient not taking: Reported on 02/04/2022 05/24/19   Vanessa Kick, MD  permethrin  (ELIMITE) 5 % cream Apply to affected area once Patient not taking: Reported on 02/04/2022 08/04/20   Kinnie Feil, PA-C  traMADol (ULTRAM) 50 MG tablet Take 1 tablet (50 mg total) by mouth every 6 (six) hours as needed. Patient not taking: Reported on 02/04/2022 05/25/19   Raylene Everts, MD      Allergies    Fish allergy, Motrin [ibuprofen], Mushroom extract complex, Shellfish-derived products, and Tylenol [acetaminophen]    Review of Systems   Review of Systems  Constitutional:  Positive for chills.  Gastrointestinal:  Positive for abdominal pain and nausea.  All other systems reviewed and are negative.   Physical Exam Updated Vital Signs BP 114/71   Pulse (!) 103   Temp 99.7 F (37.6 C) (Oral)   Resp (!) 24   SpO2 100%  Physical Exam Vitals and nursing note reviewed.  Constitutional:      General: She is not in acute distress.    Appearance: She is well-developed.  HENT:     Head: Normocephalic and atraumatic.  Eyes:     Conjunctiva/sclera: Conjunctivae normal.  Cardiovascular:     Rate and Rhythm: Normal rate and regular rhythm.     Heart sounds: No murmur heard. Pulmonary:     Effort: Pulmonary effort is normal. No respiratory distress.     Breath sounds: Normal breath sounds.  Abdominal:     Palpations: Abdomen is soft.     Tenderness: There is generalized abdominal tenderness. There is guarding and rebound.  Musculoskeletal:        General: No swelling.     Cervical back: Neck supple.  Skin:  General: Skin is warm and dry.     Capillary Refill: Capillary refill takes less than 2 seconds.  Neurological:     Mental Status: She is alert.  Psychiatric:        Mood and Affect: Mood normal.     ED Results / Procedures / Treatments   Labs (all labs ordered are listed, but only abnormal results are displayed) Labs Reviewed  COMPREHENSIVE METABOLIC PANEL - Abnormal; Notable for the following components:      Result Value   Potassium 3.3 (*)     Chloride 93 (*)    Glucose, Bld 103 (*)    Calcium 8.6 (*)    Albumin 2.0 (*)    All other components within normal limits  CBC WITH DIFFERENTIAL/PLATELET - Abnormal; Notable for the following components:   WBC 16.9 (*)    RBC 3.48 (*)    Hemoglobin 10.1 (*)    HCT 30.7 (*)    Platelets 518 (*)    Neutro Abs 13.4 (*)    Monocytes Absolute 1.1 (*)    Abs Immature Granulocytes 0.14 (*)    All other components within normal limits  CULTURE, BLOOD (ROUTINE X 2)  CULTURE, BLOOD (ROUTINE X 2)  LACTIC ACID, PLASMA  URINALYSIS, ROUTINE W REFLEX MICROSCOPIC  I-STAT BETA HCG BLOOD, ED ([facility], [facility], [facility] ONLY)    EKG None  Radiology CT ABDOMEN PELVIS W CONTRAST  Result Date: 02/08/2022 CLINICAL DATA:  Sepsis EXAM: CT ABDOMEN AND PELVIS WITH CONTRAST TECHNIQUE: Multidetector CT imaging of the abdomen and pelvis was performed using the standard protocol following bolus administration of intravenous contrast. RADIATION DOSE REDUCTION: This exam was performed according to the departmental dose-optimization program which includes automated exposure control, adjustment of the mA and/or kV according to patient size and/or use of iterative reconstruction technique. CONTRAST:  OMNIPAQUE IOHEXOL 300 MG/ML  SOLN COMPARISON:  Previous studies including the CT done on Mar 01, 2022 FINDINGS: Lower chest: Small bilateral pleural effusions are seen more so on the right side. There are linear patchy infiltrates in the lingula and both lower lobes suggesting atelectasis/pneumonia. Hepatobiliary: No focal abnormality is seen in the liver. There is no dilation of bile ducts. Gallbladder is unremarkable. Small ascites is noted in the perihepatic region. Pancreas: No focal abnormality is seen. Spleen: Unremarkable. Adrenals/Urinary Tract: Adrenals are not enlarged. There is no hydronephrosis. There are no renal or ureteral stones. Urinary bladder is unremarkable. There is 3 mm low-density in the midportion of left kidney  which is too small to be characterized. Stomach/Bowel: Stomach is not distended. Small bowel loops are not dilated. Appendix is not seen. There is no significant wall thickening in the colon. There is interval placement of percutaneous drainage catheter with its tip in the right lower abdomen. There is interval decrease in size of the complex fluid collection in the right lower quadrant. There is 4 cm loculated fluid collection slightly to the left of midline at the level of pelvic inlet. There is 7.2 x 5.3 x 4.9 cm loculated fluid collection with minimally enhancing margin in the cul-de-sac in the pelvis. There are no pockets of air within these loculated fluid collections. There is stranding in the fat planes in the lower abdomen and pelvis. Vascular/Lymphatic: Vascular structures are unremarkable. Reproductive: Uterus is to the right of midline. Other: There is no pneumoperitoneum. Musculoskeletal: No acute findings are seen. IMPRESSION: Interval placement of percutaneous drainage catheter within air-fluid collection in the right lower abdomen with interval decrease in size.  There is 7.2 cm loculated fluid collection in the cul-de-sac in the pelvis with slightly enhancing margins. There is another 4 cm loculated fluid collection in the left side of the pelvis. These may suggest loculated serous fluid collections or abscesses. Small ascites is noted in the perihepatic region. There is no evidence of intestinal obstruction or pneumoperitoneum. There is no hydronephrosis. Small bilateral pleural effusions. There are linear patchy infiltrates in both lower lung fields suggesting atelectasis/pneumonia. Electronically Signed   By: Ernie Avena M.D.   On: 02/08/2022 09:41    Procedures .Critical Care  Performed by: Ernie Avena, MD Authorized by: Ernie Avena, MD   Critical care provider statement:    Critical care time (minutes):  30   Critical care was necessary to treat or prevent imminent or  life-threatening deterioration of the following conditions:  Sepsis   Critical care was time spent personally by me on the following activities:  Development of treatment plan with patient or surrogate, discussions with consultants, evaluation of patient's response to treatment, examination of patient, ordering and review of laboratory studies, ordering and review of radiographic studies, ordering and performing treatments and interventions, pulse oximetry, re-evaluation of patient's condition and review of old charts   Care discussed with: admitting provider       Medications Ordered in ED Medications  lactated ringers infusion ( Intravenous New Bag/Given 02/08/22 0923)  doxycycline (VIBRAMYCIN) 100 mg in sodium chloride 0.9 % 250 mL IVPB (100 mg Intravenous New Bag/Given 02/08/22 0925)  ceFEPIme (MAXIPIME) 2 g in sodium chloride 0.9 % 100 mL IVPB (has no administration in time range)  lactated ringers bolus 1,000 mL (1,000 mLs Intravenous New Bag/Given 02/08/22 0833)    And  lactated ringers bolus 1,000 mL (1,000 mLs Intravenous New Bag/Given 02/08/22 0833)    And  lactated ringers bolus 500 mL (0 mLs Intravenous Stopped 02/08/22 0926)  ceFEPIme (MAXIPIME) 2 g in sodium chloride 0.9 % 100 mL IVPB (0 g Intravenous Stopped 02/08/22 0855)  metroNIDAZOLE (FLAGYL) IVPB 500 mg (500 mg Intravenous New Bag/Given 02/08/22 0849)  HYDROmorphone (DILAUDID) injection 1 mg (1 mg Intravenous Given 02/08/22 0850)  ondansetron (ZOFRAN) injection 4 mg (4 mg Intravenous Given 02/08/22 0850)  iohexol (OMNIPAQUE) 300 MG/ML solution 100 mL (100 mLs Intrathecal Contrast Given 02/08/22 0916)    ED Course/ Medical Decision Making/ A&P Clinical Course as of 02/08/22 1122  Mon Feb 08, 2022  0808 WBC(!): 16.9 [JL]  0808 Potassium(!): 3.3 [JL]  0811 Pulse Rate(!): 117 [JL]    Clinical Course User Index [JL] Ernie Avena, MD                           Medical Decision Making Amount and/or Complexity of Data  Reviewed Labs: ordered. Decision-making details documented in ED Course. Radiology: ordered.  Risk Prescription drug management. Decision regarding hospitalization.    31 year old female with medical history significant for septic shock and multisystem organ failure from a tubo-ovarian abscess, treated with percutaneous drain via IR, left from the ICU yesterday 02/07/2022 who represents to the emergency department with worsening abdominal pain, nausea, vomiting.  The patient represents to the emergency department with concern for sepsis in the setting of ongoing infection associated with her PID and tubo-ovarian abscess.  She states that she left AMA from the ICU yesterday because it was Father's Day and she wanted to visit her father's grave.  Since the antibiotics and pain medicine stopped, the patient has since had  worsening abdominal pain diffusely throughout the abdomen with chills.  On arrival, the patient was afebrile, tachycardic P118, normotensive.  WBC from triage collected and significant for leukocytosis 16.9.  Due to this, concern for ongoing sepsis in the setting of the patient's symptoms.  Due to the patient's worsening abdominal pain, CT abdomen pelvis with contrast was ordered.  Code sepsis was initiated upon my evaluation.  IV antibiotics, broad-spectrum to include IV doxycycline, IV Flagyl, IV cefepime were ordered.  The patient was administered a 30 cc/kg IV fluid bolus.  Labs reviewed and interpreted by myself, CMP with mild hypokalemia to 3.3, no other significant electrolyte abnormality, normal renal and liver function, CBC with leukocytosis to 16.9, anemia to 10.1, lactic acid normal, beta-hCG normal, blood cultures x2 collected and pending.  CT abdomen pelvis was performed and revealed the following: IMPRESSION:  Interval placement of percutaneous drainage catheter within  air-fluid collection in the right lower abdomen with interval  decrease in size.    There is 7.2 cm  loculated fluid collection in the cul-de-sac in the  pelvis with slightly enhancing margins. There is another 4 cm  loculated fluid collection in the left side of the pelvis. These may  suggest loculated serous fluid collections or abscesses. Small  ascites is noted in the perihepatic region.    There is no evidence of intestinal obstruction or pneumoperitoneum.  There is no hydronephrosis.    Small bilateral pleural effusions. There are linear patchy  infiltrates in both lower lung fields suggesting  atelectasis/pneumonia.    10:02 AM Reviewed the case with Dr. Damita Dunnings of OB/GYN who recommended continued IV antibiotics, admission and reconsultation/engagement with IR for consideration of additional drain placement given the additional fluid collections noted on CT.  On repeat assessment, the patient appeared volume resuscitated and maintain maps greater than 65 following initial fluids.  She is status post broad-spectrum antibiotics.  Interventional radiology was consulted.  Medicine consulted for admission.   I discussed the case with on-call interventional radiology, Dr. Annamaria Boots he will have a member of his team see the patient.  The internal medicine teaching service was consulted for admission and subsequently excepted the patient in admission for further care and monitoring.  On repeat assessment, the patient's heart rate improved from the 118s down to the 100s and she remained hemodynamically stable in the ED.  Subsequently admitted in stable condition.   Final Clinical Impression(s) / ED Diagnoses Final diagnoses:  Sepsis, due to unspecified organism, unspecified whether acute organ dysfunction present [facility])  Pelvic abscess in female    Rx / DC Orders ED Discharge Orders     None         Regan Lemming, MD 02/08/22 1122

## 2022-02-08 NOTE — H&P (Signed)
Date: 02/08/2022               Patient Name:  Paula Lucas MRN: [id number]  DOB: July 23, 1991 Age / Sex: 31 y.o., female   PCP: Pcp, No         Medical Service: Internal Medicine Teaching Service         Attending Physician: Dr. Mercie Eon, MD    First Contact: Dr. Ruben Im Pager: [phone]  Second Contact: Dr. Cyndie Chime Pager: [phone]       After Hours (After 5p/  First Contact Pager: [phone]  weekends / holidays): Second Contact Pager: [phone]   Chief Complaint: Nausea, vomiting and abdominal pain   History of Present Illness:   Paula Lucas is a 32 year old female with a past medical history of recent admission for tubo-ovarian abscess complicated by septic shock (02/04/22 - 02/07/22) with previous history of tubo-ovarian abscess 2/2 N. Gonorrhea in 2019 who presents to the ED with c/o nausea, vomiting and abdominal pain.  [name] was recently admitted on 6/15 for septic shock with CT imaging remarkable for a large complex fluid collection measuring 12 x 8 cm extending from the cul-de-sac superiorly to the cecum.  Patient endorsed heavy vaginal bleeding prior to admission and there was concern for pregnancy, however serial hCGs were negative.  Patient was admitted to the ICU where she was required Levophed for approximately 48 hours.  Patient was initially started on cefepime, doxycycline and metronidazole for suspected tubo-ovarian abscess and was subsequently transition to ceftriaxone, doxycycline, metronidazole.  IR was consulted with right lower quadrant percutaneous drain placement on 6/16.  Blood cultures and surgical cultures are no growth to date; G/C negative.  On 6/18, patient left AMA.   Today, Ms. Saulsbury states that she has been experiencing significant nausea with 2 episodes of vomiting within the last 24 hours.  Her emesis contained scant amounts of bright red blood and coffee-grounds.  She has also been experiencing diffuse, severe abdominal pain that waxes and wanes.   She denies any changes in her bowel movements.  She endorses subjective fevers, chills, cold sweats, pleuritic chest pain.  She notes that her right lower quadrant drain remains in place and continues to drain.  ED course: On arrival to the ED, patient was tachycardic up to 117 with a blood pressure of 119/69.  She was saturating at 95% on room air.  CBC demonstrates leukocytosis at 16.9, hemoglobin of 10.1 and platelets of 518.  CMP remarkable for potassium of 3.3.  CT abdomen/pelvis was obtained that demonstrated percutaneous drain within fluid collection in the right lower abdomen with interval decrease in size.  There is a new 7.2 cm loculated fluid collection in the cul-de-sac in addition to another 4 cm loculated fluid collection in the left side of the pelvis.  There is mild ascites in the perihepatic region.  In addition small bilateral pleural effusions and findings consistent with atelectasis versus pneumonia.   Patient was started on Cefepime, Metronidazole and Doxycycline. IR and OB/GYN were consulted.  IMTS consulted for admission  Meds:  No current facility-administered medications on file prior to encounter.   Current Outpatient Medications on File Prior to Encounter  Medication Sig Dispense Refill   doxycycline (VIBRAMYCIN) 100 MG capsule Take 1 capsule (100 mg total) by mouth 2 (two) times daily. (Patient not taking: Reported on 02/04/2022) 20 capsule 0   hydrOXYzine (ATARAX/VISTARIL) 25 MG tablet Take 1 tablet (25 mg total) by mouth every 6 (six) hours. (Patient  not taking: Reported on 02/04/2022) 12 tablet 0   methylPREDNISolone (MEDROL DOSEPAK) 4 MG TBPK tablet Follow package insert (Patient not taking: Reported on 02/04/2022) 21 each 0   penicillin v potassium (VEETID) 500 MG tablet Take 1 tablet (500 mg total) by mouth 3 (three) times daily. (Patient not taking: Reported on 02/04/2022) 30 tablet 0   permethrin (ELIMITE) 5 % cream Apply to affected area once (Patient not taking:  Reported on 02/04/2022) 60 g 0   traMADol (ULTRAM) 50 MG tablet Take 1 tablet (50 mg total) by mouth every 6 (six) hours as needed. (Patient not taking: Reported on 02/04/2022) 10 tablet 0   Allergies: Allergies as of 02/08/2022 - Review Complete 02/08/2022  Allergen Reaction Noted   Fish allergy Anaphylaxis 03/15/2011   Motrin [ibuprofen] Anaphylaxis 02/04/2022   Mushroom extract complex Anaphylaxis 03/06/2018   Shellfish-derived products Anaphylaxis 08/21/2018   Tylenol [acetaminophen] Anaphylaxis 04/01/2018   Past Medical History:  Diagnosis Date   Asthma    BMI 31.0-31.9,adult    Chlamydia    Gonorrhea    TOA (tubo-ovarian abscess)    Family History:  Family History  Problem Relation Age of Onset   Diabetes Mother    Hypertension Mother    Diabetes Father    Hypertension Father    Diabetes Sister    Hypertension Sister    Social History:  - Currently lives in [place] with her mother - She reports quitting tobacco use approximately 4 to 5 months ago. - She reports quitting alcohol use approximately 8 to 9 months ago - She reports quitting marijuana use several months ago as well.  She denies any other illicit drug use  Review of Systems: A complete ROS was negative except as per HPI.   Physical Exam: Blood pressure 116/79, pulse (!) 103, temperature 99.7 F (37.6 C), temperature source Oral, resp. rate (!) 24, SpO2 99 %, unknown if currently breastfeeding.  Physical Exam Vitals and nursing note reviewed.  Constitutional:      Appearance: She is overweight. She is ill-appearing. She is not diaphoretic.  HENT:     Head: Normocephalic and atraumatic.     Mouth/Throat:     Mouth: Mucous membranes are moist.  Eyes:     Extraocular Movements: Extraocular movements intact.     Pupils: Pupils are equal, round, and reactive to light.  Cardiovascular:     Rate and Rhythm: Regular rhythm. Tachycardia present.     Heart sounds: No murmur heard.    No gallop.   Pulmonary:     Effort: Tachypnea present. No accessory muscle usage or respiratory distress.     Comments: Anterior lung fields clear to auscultation bilaterally. Posterior examination deferred due to patient preference.  Abdominal:     General: Bowel sounds are normal. There is distension (mild).     Palpations: Abdomen is soft.     Tenderness: There is abdominal tenderness (diffuse tenderness to palpation with minimal palpation). There is guarding.  Genitourinary:    Comments: Patient declined examination. Musculoskeletal:        General: No swelling or deformity.     Right lower leg: No edema.     Left lower leg: No edema.  Skin:    General: Skin is warm and dry.  Neurological:     General: No focal deficit present.     Mental Status: She is alert and oriented to person, place, and time.     Motor: No weakness.  Psychiatric:  Mood and Affect: Mood normal.        Behavior: Behavior normal.    Assessment & Plan by Problem: Principal Problem:   Pelvic abscess   Ms. Royse is a 31 year old female with a past medical history of recent admission for tubo-ovarian abscess complicated by septic shock (02/04/22 - 02/07/22) with previous history of tubo-ovarian abscess 2/2 N. Gonorrhea in 2019 who presents to the ED with c/o nausea, vomiting and abdominal pain, currently admitted for multiple pelvic abscesses.   # Pelvic Abscesses Likely initially 2/2 tubo-ovarian abscess; there was initial concern for complicated incomplete abortion given positive home pregnancy test, however serial hCGs are negative. Course has been complicated by septic shock (resolved) and now with additional pelvic abscesses. Ms. Culton has been on broad spectrum coverage; unfortunately cultures have been largely negative. Given spread of abscesses, will continue Cefepime rather than Rocephin. Will increase Flagyl dosing to TID.   Discussed case with IR, who will evaluate to ensure current RLQ drain is working  properly prior to considering the placement of additional drains. Discussed with OB/GYN as well. Given exquisite TTP on examination, I am concerned patient is at risk of developing peritonitis. Will monitor on IV antibiotics before considering surgical intervention.   - IR and OB/GYN following; appreciate their recommendations  - Cefepime 2 g IV TID  - Metronidazole 500 mg IV TID  - Doxycycline 100 mg IV BID - Repeat blood cultures pending - Toradol 15 mg IV QID PRN - Dilaudid 1 mg Q4h PRN   # Normocytic Anemia Hemoglobin stable since admission on the 16th. Likely multi-factorial with vaginal bleeding prior to arrival and critical illness contributing. Scant blood noted in emesis but none while in the ED.   - Monitor CBC while admitted - Transfuse for hemoglobin < 7 - Iron panel tomorrow AM  # Thrombocytosis  Likely reactive.   # Hypokalemia - Monitor daily and replete PRN    Diet: NPO VTE: SCDs till IR evaluation. If no plan for drain placement, plan to start Lovenox.  IVF: LR,75cc/hr Code: Full  Prior to Admission Living Arrangement: Home, living with her mother Anticipated Discharge Location: Home Barriers to Discharge: Continued IV antibiotics, possible drain placement  Dispo: Admit patient to Observation with expected length of stay less than 2 midnights.  Signed: Dr. Verdene Lennert Internal Medicine PGY-3 Pager: [phone] After 5pm on weekdays and 1pm on weekends: On Call pager [phone]  02/08/2022, 12:49 PM

## 2022-02-08 NOTE — Consult Note (Signed)
OBSTETRICS AND GYNECOLOGY ATTENDING CONSULT NOTE  Consult Date: 02/08/2022 Reason for Consult: Sepsis, TOA, (Readmission after leaving AMA on 6/18) Consulting Provider: Dr. Ernie Avena (ED)   Assessment/Plan: - Resume Cefepime, Flagyl and Doxycycline for now. Analgesia as needed. - No surgical intervention necessary for now, but given 7cm fluid collection still in cul-de-sac would consult IR for possible drainage. Would reconsider in 48h if not improved on ABX regarding drainage and surgery. At this time, I would avoid surgery due to high risk of complication from the surgery (I.e. bowel injury, injury to surrounding organs/structures). However, if not improved clinically and IR cannot drain, then would have little alternative for surgical intervention. There is still benefit to giving time with antibiotics before surgery. For these reasons I would hold on intervention for at least 48 hours.   Appreciate care of Paula Lucas by her primary team  We will follow along with daily rounding on patient  Please call [phone] for any gynecologic concerns at any time.  Thank you for involving [place] in the care of this patient.  Total consultation time including face-to-face time with patient (>50% of time), reviewing chart and documentation: 71  Milas Hock MD, FACOG Obstetrician & Gynecologist, [facility] for [facility], [facility]    History of Present Illness: Paula Lucas is an 31 y.o. [id number] female who was admitted with sepsis in the setting of left tuboovarian abscess and pyometrium.  History of gonorrhea, chlamydia and TOA admission in 2019 but was negative during this most recent admission.   She left AMA yesterday for father's day (wanted to go to her father's grave) and returned to [facility] for ongoing therapy/treatment.  She was found to be afebrile but tachycardic. Repeat imaging showed  7 cm and 4cm areas of loculated fluid vs small  abscesses. I reviewed the images myself and concur and noted that the drain is superior to the majority of the fluid collections now particularly the largest of the grouping which is in the cul-de-sac (best seen on the sagittal views). Compared to imaging from 6/15 (which I also reviewed myself), the overall collections are reduced from the superior aspect of the collection (likely due to the drain based on location of the drain).   She was initially on Zosyn, changed to Ceftriaxone, Doxycyline and Flagyl upon transfer to [facility] ICU until she left AMA. Her initial blood cultures are NG x4 days. She had a wet prep which was positive for clue cells but GC/CT were negative.   Pertinent OB/GYN History: No LMP recorded. (Menstrual status: Other). OB History  Gravida Para Term Preterm AB Living  2 0 0 0 1 0  SAB IAB Ectopic Multiple Live Births  0 1 0 0 0    # Outcome Date GA Lbr Len/2nd Weight Sex Delivery Anes PTL Lv  2 Gravida           1 IAB             Patient Active Problem List   Diagnosis Date Noted   Septic shock (HCC) 02/04/2022   Pelvic abscess      Past Medical History:  Diagnosis Date   Asthma    BMI 31.0-31.9,adult    Chlamydia    Gonorrhea    TOA (tubo-ovarian abscess)     Past Surgical History:  Procedure Laterality Date   INDUCED ABORTION     IR RADIOLOGIST EVAL & MGMT  05/17/2018   TONSILLECTOMY  Family History  Problem Relation Age of Onset   Diabetes Mother    Hypertension Mother    Diabetes Father    Hypertension Father    Diabetes Sister    Hypertension Sister     Social History:  reports that she has been smoking cigarettes. She has a 4.50 pack-year smoking history. She has never used smokeless tobacco. She reports current drug use. Drug: Marijuana. She reports that she does not drink alcohol.  Allergies:  Allergies  Allergen Reactions   Fish Allergy Anaphylaxis    Severe allergic reaction to all seafoods   Motrin [Ibuprofen] Anaphylaxis    Pt  tolerated Ketorolac 04/02/18   Mushroom Extract Complex Anaphylaxis   Shellfish-Derived Products Anaphylaxis   Tylenol [Acetaminophen] Anaphylaxis    Medications: I have reviewed the patient's current medications. Prior to Admission:  (Not in a hospital admission)  Scheduled:  multivitamin with minerals  1 tablet Oral Daily   sodium chloride flush  3 mL Intravenous Q12H    Continuous:  ceFEPime (MAXIPIME) IV     doxycycline (VIBRAMYCIN) IV Stopped (02/08/22 1147)   lactated ringers 75 mL/hr at 02/08/22 1419   metronidazole     XBW:IOMBTDHRCBULA (DILAUDID) injection, ketorolac, ondansetron **OR** ondansetron (ZOFRAN) IV, polyethylene glycol Anti-infectives (From admission, onward)    Start     Dose/Rate Route Frequency Ordered Stop   02/08/22 1700  metroNIDAZOLE (FLAGYL) IVPB 500 mg        500 mg 100 mL/hr over 60 Minutes Intravenous Every 8 hours 02/08/22 1205     02/08/22 1630  ceFEPIme (MAXIPIME) 2 g in sodium chloride 0.9 % 100 mL IVPB        2 g 200 mL/hr over 30 Minutes Intravenous Every 8 hours 02/08/22 0822     02/08/22 0900  doxycycline (VIBRAMYCIN) 100 mg in sodium chloride 0.9 % 250 mL IVPB        100 mg 125 mL/hr over 120 Minutes Intravenous 2 times daily 02/08/22 0811     02/08/22 0815  ceFEPIme (MAXIPIME) 2 g in sodium chloride 0.9 % 100 mL IVPB        2 g 200 mL/hr over 30 Minutes Intravenous  Once 02/08/22 0811 02/08/22 0855   02/08/22 0815  metroNIDAZOLE (FLAGYL) IVPB 500 mg        500 mg 100 mL/hr over 60 Minutes Intravenous  Once 02/08/22 0811 02/08/22 1147       Review of Systems: Pertinent items noted in HPI and remainder of comprehensive ROS otherwise negative.  Focused Physical Examination: BP 116/79   Pulse (!) 103   Temp 99.7 F (37.6 C) (Oral)   Resp (!) 24   SpO2 99%  CONSTITUTIONAL: Well-developed, well-nourished female in no acute distress.  NEUROLOGIC: Alert and oriented to person, place, and time. Normal reflexes, muscle tone  coordination. No cranial nerve deficit noted. CARDIOVASCULAR: Normal heart rate noted, regular rhythm RESPIRATORY: Effort and breath sounds normal, no problems with respiration noted. ABDOMEN: Extremely tender on exam especially in her pelvis. Pain is diffuse. Tender equally with pressure and release.  PELVIC: Deferred MUSCULOSKELETAL:  No cyanosis, clubbing, or edema.  2+ distal pulses.  Labs and Imaging: Results for orders placed or performed during the hospital encounter of 02/08/22 (from the past 72 hour(s))  Lactic acid, plasma     Status: None   Collection Time: 02/08/22  1:06 AM  Result Value Ref Range   Lactic Acid, Venous 1.4 0.5 - 1.9 mmol/L    Comment: Performed at  [facility], [address] [place], [place] [address]  Comprehensive metabolic panel     Status: Abnormal   Collection Time: 02/08/22  1:06 AM  Result Value Ref Range   Sodium 135 135 - 145 mmol/L   Potassium 3.3 (L) 3.5 - 5.1 mmol/L   Chloride 93 (L) 98 - 111 mmol/L   CO2 28 22 - 32 mmol/L   Glucose, Bld 103 (H) 70 - 99 mg/dL    Comment: Glucose reference range applies only to samples taken after fasting for at least 8 hours.   BUN 6 6 - 20 mg/dL   Creatinine, Ser 4.01 0.44 - 1.00 mg/dL   Calcium 8.6 (L) 8.9 - 10.3 mg/dL   Total Protein 7.1 6.5 - 8.1 g/dL   Albumin 2.0 (L) 3.5 - 5.0 g/dL   AST 32 15 - 41 U/L   ALT 30 0 - 44 U/L   Alkaline Phosphatase 71 38 - 126 U/L   Total Bilirubin 0.7 0.3 - 1.2 mg/dL   GFR, Estimated >02 >72 mL/min    Comment: (NOTE) Calculated using the CKD-EPI Creatinine Equation (2021)    Anion gap 14 5 - 15    Comment: Performed at [facility], [address], [place], [place] [address]  CBC with Differential     Status: Abnormal   Collection Time: 02/08/22  1:06 AM  Result Value Ref Range   WBC 16.9 (H) 4.0 - 10.5 K/uL   RBC 3.48 (L) 3.87 - 5.11 MIL/uL   Hemoglobin 10.1 (L) 12.0 - 15.0 g/dL   HCT 40.3 (L) 47.4 - 25.9 %   MCV 88.2 80.0 - 100.0 fL   MCH 29.0  26.0 - 34.0 pg   MCHC 32.9 30.0 - 36.0 g/dL   RDW 56.3 87.5 - 64.3 %   Platelets 518 (H) 150 - 400 K/uL   nRBC 0.0 0.0 - 0.2 %   Neutrophils Relative % 80 %   Neutro Abs 13.4 (H) 1.7 - 7.7 K/uL   Lymphocytes Relative 13 %   Lymphs Abs 2.2 0.7 - 4.0 K/uL   Monocytes Relative 6 %   Monocytes Absolute 1.1 (H) 0.1 - 1.0 K/uL   Eosinophils Relative 0 %   Eosinophils Absolute 0.0 0.0 - 0.5 K/uL   Basophils Relative 0 %   Basophils Absolute 0.1 0.0 - 0.1 K/uL   Immature Granulocytes 1 %   Abs Immature Granulocytes 0.14 (H) 0.00 - 0.07 K/uL    Comment: Performed at [facility], [address], [place], [place] [address]  I-Stat beta hCG blood, ED     Status: None   Collection Time: 02/08/22  1:15 AM  Result Value Ref Range   I-stat hCG, quantitative <5.0 <5 mIU/mL   Comment 3            Comment:   GEST. AGE      CONC.  (mIU/mL)   <=1 WEEK        5 - 50     2 WEEKS       50 - 500     3 WEEKS       100 - 10,000     4 WEEKS     1,000 - 30,000        FEMALE AND NON-PREGNANT FEMALE:     LESS THAN 5 mIU/mL     CT ABDOMEN PELVIS W CONTRAST  Result Date: 02/08/2022 CLINICAL DATA:  Sepsis EXAM: CT ABDOMEN AND PELVIS  WITH CONTRAST TECHNIQUE: Multidetector CT imaging of the abdomen and pelvis was performed using the standard protocol following bolus administration of intravenous contrast. RADIATION DOSE REDUCTION: This exam was performed according to the departmental dose-optimization program which includes automated exposure control, adjustment of the mA and/or kV according to patient size and/or use of iterative reconstruction technique. CONTRAST:  OMNIPAQUE IOHEXOL 300 MG/ML  SOLN COMPARISON:  Previous studies including the CT done on 02-Mar-2022 FINDINGS: Lower chest: Small bilateral pleural effusions are seen more so on the right side. There are linear patchy infiltrates in the lingula and both lower lobes suggesting atelectasis/pneumonia. Hepatobiliary: No focal abnormality is seen  in the liver. There is no dilation of bile ducts. Gallbladder is unremarkable. Small ascites is noted in the perihepatic region. Pancreas: No focal abnormality is seen. Spleen: Unremarkable. Adrenals/Urinary Tract: Adrenals are not enlarged. There is no hydronephrosis. There are no renal or ureteral stones. Urinary bladder is unremarkable. There is 3 mm low-density in the midportion of left kidney which is too small to be characterized. Stomach/Bowel: Stomach is not distended. Small bowel loops are not dilated. Appendix is not seen. There is no significant wall thickening in the colon. There is interval placement of percutaneous drainage catheter with its tip in the right lower abdomen. There is interval decrease in size of the complex fluid collection in the right lower quadrant. There is 4 cm loculated fluid collection slightly to the left of midline at the level of pelvic inlet. There is 7.2 x 5.3 x 4.9 cm loculated fluid collection with minimally enhancing margin in the cul-de-sac in the pelvis. There are no pockets of air within these loculated fluid collections. There is stranding in the fat planes in the lower abdomen and pelvis. Vascular/Lymphatic: Vascular structures are unremarkable. Reproductive: Uterus is to the right of midline. Other: There is no pneumoperitoneum. Musculoskeletal: No acute findings are seen. IMPRESSION: Interval placement of percutaneous drainage catheter within air-fluid collection in the right lower abdomen with interval decrease in size. There is 7.2 cm loculated fluid collection in the cul-de-sac in the pelvis with slightly enhancing margins. There is another 4 cm loculated fluid collection in the left side of the pelvis. These may suggest loculated serous fluid collections or abscesses. Small ascites is noted in the perihepatic region. There is no evidence of intestinal obstruction or pneumoperitoneum. There is no hydronephrosis. Small bilateral pleural effusions. There are linear  patchy infiltrates in both lower lung fields suggesting atelectasis/pneumonia. Electronically Signed   By: Ernie Avena M.D.   On: 02/08/2022 09:41

## 2022-02-08 NOTE — ED Triage Notes (Signed)
Pt left AMA yesterday from the ICU, comes today for abd pain, pt has drain from a pelvic abscess that she was septic from.

## 2022-02-08 NOTE — ED Notes (Signed)
Pt transported to CT via stretcher at this time.  

## 2022-02-09 ENCOUNTER — Other Ambulatory Visit: Payer: Self-pay

## 2022-02-09 DIAGNOSIS — J90 Pleural effusion, not elsewhere classified: Secondary | ICD-10-CM | POA: Diagnosis present

## 2022-02-09 DIAGNOSIS — E876 Hypokalemia: Secondary | ICD-10-CM | POA: Diagnosis present

## 2022-02-09 DIAGNOSIS — K5900 Constipation, unspecified: Secondary | ICD-10-CM | POA: Diagnosis not present

## 2022-02-09 DIAGNOSIS — R188 Other ascites: Secondary | ICD-10-CM | POA: Diagnosis present

## 2022-02-09 DIAGNOSIS — F32A Depression, unspecified: Secondary | ICD-10-CM | POA: Diagnosis not present

## 2022-02-09 DIAGNOSIS — G43909 Migraine, unspecified, not intractable, without status migrainosus: Secondary | ICD-10-CM | POA: Diagnosis not present

## 2022-02-09 DIAGNOSIS — J9601 Acute respiratory failure with hypoxia: Secondary | ICD-10-CM | POA: Diagnosis not present

## 2022-02-09 DIAGNOSIS — T45516A Underdosing of anticoagulants, initial encounter: Secondary | ICD-10-CM | POA: Diagnosis not present

## 2022-02-09 DIAGNOSIS — K750 Abscess of liver: Secondary | ICD-10-CM | POA: Diagnosis not present

## 2022-02-09 DIAGNOSIS — B3731 Acute candidiasis of vulva and vagina: Secondary | ICD-10-CM | POA: Diagnosis present

## 2022-02-09 DIAGNOSIS — E43 Unspecified severe protein-calorie malnutrition: Secondary | ICD-10-CM | POA: Diagnosis present

## 2022-02-09 DIAGNOSIS — T82524A Displacement of infusion catheter, initial encounter: Secondary | ICD-10-CM | POA: Diagnosis not present

## 2022-02-09 DIAGNOSIS — R112 Nausea with vomiting, unspecified: Secondary | ICD-10-CM | POA: Diagnosis not present

## 2022-02-09 DIAGNOSIS — F319 Bipolar disorder, unspecified: Secondary | ICD-10-CM | POA: Diagnosis present

## 2022-02-09 DIAGNOSIS — J810 Acute pulmonary edema: Secondary | ICD-10-CM | POA: Diagnosis not present

## 2022-02-09 DIAGNOSIS — K635 Polyp of colon: Secondary | ICD-10-CM | POA: Diagnosis not present

## 2022-02-09 DIAGNOSIS — M79604 Pain in right leg: Secondary | ICD-10-CM | POA: Diagnosis not present

## 2022-02-09 DIAGNOSIS — B9562 Methicillin resistant Staphylococcus aureus infection as the cause of diseases classified elsewhere: Secondary | ICD-10-CM | POA: Diagnosis not present

## 2022-02-09 DIAGNOSIS — R109 Unspecified abdominal pain: Secondary | ICD-10-CM | POA: Diagnosis not present

## 2022-02-09 DIAGNOSIS — R197 Diarrhea, unspecified: Secondary | ICD-10-CM | POA: Diagnosis not present

## 2022-02-09 DIAGNOSIS — L0291 Cutaneous abscess, unspecified: Secondary | ICD-10-CM | POA: Diagnosis not present

## 2022-02-09 DIAGNOSIS — E8770 Fluid overload, unspecified: Secondary | ICD-10-CM | POA: Diagnosis not present

## 2022-02-09 DIAGNOSIS — K660 Peritoneal adhesions (postprocedural) (postinfection): Secondary | ICD-10-CM | POA: Diagnosis present

## 2022-02-09 DIAGNOSIS — N7093 Salpingitis and oophoritis, unspecified: Secondary | ICD-10-CM | POA: Diagnosis not present

## 2022-02-09 DIAGNOSIS — R7881 Bacteremia: Secondary | ICD-10-CM | POA: Diagnosis not present

## 2022-02-09 DIAGNOSIS — Z886 Allergy status to analgesic agent status: Secondary | ICD-10-CM | POA: Diagnosis not present

## 2022-02-09 DIAGNOSIS — R7989 Other specified abnormal findings of blood chemistry: Secondary | ICD-10-CM | POA: Diagnosis not present

## 2022-02-09 DIAGNOSIS — A4102 Sepsis due to Methicillin resistant Staphylococcus aureus: Secondary | ICD-10-CM | POA: Diagnosis present

## 2022-02-09 DIAGNOSIS — E46 Unspecified protein-calorie malnutrition: Secondary | ICD-10-CM | POA: Diagnosis not present

## 2022-02-09 DIAGNOSIS — Z91013 Allergy to seafood: Secondary | ICD-10-CM | POA: Diagnosis not present

## 2022-02-09 DIAGNOSIS — R0602 Shortness of breath: Secondary | ICD-10-CM | POA: Diagnosis not present

## 2022-02-09 DIAGNOSIS — R509 Fever, unspecified: Secondary | ICD-10-CM | POA: Diagnosis not present

## 2022-02-09 DIAGNOSIS — R8271 Bacteriuria: Secondary | ICD-10-CM | POA: Diagnosis present

## 2022-02-09 DIAGNOSIS — F209 Schizophrenia, unspecified: Secondary | ICD-10-CM | POA: Diagnosis present

## 2022-02-09 DIAGNOSIS — A419 Sepsis, unspecified organism: Secondary | ICD-10-CM | POA: Diagnosis not present

## 2022-02-09 DIAGNOSIS — Z6830 Body mass index (BMI) 30.0-30.9, adult: Secondary | ICD-10-CM | POA: Diagnosis not present

## 2022-02-09 DIAGNOSIS — F17210 Nicotine dependence, cigarettes, uncomplicated: Secondary | ICD-10-CM | POA: Diagnosis present

## 2022-02-09 DIAGNOSIS — Z888 Allergy status to other drugs, medicaments and biological substances status: Secondary | ICD-10-CM | POA: Diagnosis not present

## 2022-02-09 DIAGNOSIS — R1032 Left lower quadrant pain: Secondary | ICD-10-CM | POA: Diagnosis not present

## 2022-02-09 DIAGNOSIS — S31109D Unspecified open wound of abdominal wall, unspecified quadrant without penetration into peritoneal cavity, subsequent encounter: Secondary | ICD-10-CM | POA: Diagnosis not present

## 2022-02-09 DIAGNOSIS — D62 Acute posthemorrhagic anemia: Secondary | ICD-10-CM | POA: Diagnosis not present

## 2022-02-09 DIAGNOSIS — Z9102 Food additives allergy status: Secondary | ICD-10-CM | POA: Diagnosis not present

## 2022-02-09 DIAGNOSIS — K651 Peritoneal abscess: Secondary | ICD-10-CM | POA: Diagnosis present

## 2022-02-09 DIAGNOSIS — D75839 Thrombocytosis, unspecified: Secondary | ICD-10-CM | POA: Diagnosis present

## 2022-02-09 DIAGNOSIS — M549 Dorsalgia, unspecified: Secondary | ICD-10-CM | POA: Diagnosis not present

## 2022-02-09 DIAGNOSIS — Z8719 Personal history of other diseases of the digestive system: Secondary | ICD-10-CM | POA: Diagnosis not present

## 2022-02-09 DIAGNOSIS — D509 Iron deficiency anemia, unspecified: Secondary | ICD-10-CM | POA: Diagnosis present

## 2022-02-09 DIAGNOSIS — E669 Obesity, unspecified: Secondary | ICD-10-CM | POA: Diagnosis present

## 2022-02-09 DIAGNOSIS — R5381 Other malaise: Secondary | ICD-10-CM | POA: Diagnosis not present

## 2022-02-09 DIAGNOSIS — N739 Female pelvic inflammatory disease, unspecified: Secondary | ICD-10-CM | POA: Diagnosis not present

## 2022-02-09 DIAGNOSIS — Z22322 Carrier or suspected carrier of Methicillin resistant Staphylococcus aureus: Secondary | ICD-10-CM | POA: Diagnosis not present

## 2022-02-09 DIAGNOSIS — K9172 Accidental puncture and laceration of a digestive system organ or structure during other procedure: Secondary | ICD-10-CM | POA: Diagnosis not present

## 2022-02-09 LAB — CBC WITH DIFFERENTIAL/PLATELET
Abs Immature Granulocytes: 0.21 10*3/uL — ABNORMAL HIGH (ref 0.00–0.07)
Basophils Absolute: 0 10*3/uL (ref 0.0–0.1)
Basophils Relative: 0 %
Eosinophils Absolute: 0.1 10*3/uL (ref 0.0–0.5)
Eosinophils Relative: 1 %
HCT: 26.5 % — ABNORMAL LOW (ref 36.0–46.0)
Hemoglobin: 8.9 g/dL — ABNORMAL LOW (ref 12.0–15.0)
Immature Granulocytes: 2 %
Lymphocytes Relative: 17 %
Lymphs Abs: 2.3 10*3/uL (ref 0.7–4.0)
MCH: 29.2 pg (ref 26.0–34.0)
MCHC: 33.6 g/dL (ref 30.0–36.0)
MCV: 86.9 fL (ref 80.0–100.0)
Monocytes Absolute: 0.9 10*3/uL (ref 0.1–1.0)
Monocytes Relative: 7 %
Neutro Abs: 10.1 10*3/uL — ABNORMAL HIGH (ref 1.7–7.7)
Neutrophils Relative %: 73 %
Platelets: 460 10*3/uL — ABNORMAL HIGH (ref 150–400)
RBC: 3.05 MIL/uL — ABNORMAL LOW (ref 3.87–5.11)
RDW: 15.3 % (ref 11.5–15.5)
WBC: 13.7 10*3/uL — ABNORMAL HIGH (ref 4.0–10.5)
nRBC: 0 % (ref 0.0–0.2)

## 2022-02-09 LAB — COMPREHENSIVE METABOLIC PANEL
ALT: 22 U/L (ref 0–44)
AST: 26 U/L (ref 15–41)
Albumin: 1.6 g/dL — ABNORMAL LOW (ref 3.5–5.0)
Alkaline Phosphatase: 48 U/L (ref 38–126)
Anion gap: 8 (ref 5–15)
BUN: 9 mg/dL (ref 6–20)
CO2: 27 mmol/L (ref 22–32)
Calcium: 8 mg/dL — ABNORMAL LOW (ref 8.9–10.3)
Chloride: 101 mmol/L (ref 98–111)
Creatinine, Ser: 0.61 mg/dL (ref 0.44–1.00)
GFR, Estimated: >60 mL/min (ref >60)
Glucose, Bld: 111 mg/dL — ABNORMAL HIGH (ref 70–99)
Potassium: 2.9 mmol/L — ABNORMAL LOW (ref 3.5–5.1)
Sodium: 136 mmol/L (ref 135–145)
Total Bilirubin: 0.4 mg/dL (ref 0.3–1.2)
Total Protein: 5.7 g/dL — ABNORMAL LOW (ref 6.5–8.1)

## 2022-02-09 LAB — CULTURE, BLOOD (ROUTINE X 2)
Culture: NO GROWTH
Culture: NO GROWTH
Special Requests: ADEQUATE

## 2022-02-09 LAB — IRON AND TIBC
Iron: 15 ug/dL — ABNORMAL LOW (ref 28–170)
Saturation Ratios: 8 % — ABNORMAL LOW (ref 10.4–31.8)
TIBC: 178 ug/dL — ABNORMAL LOW (ref 250–450)
UIBC: 163 ug/dL

## 2022-02-09 MED ORDER — POTASSIUM CHLORIDE CRYS ER 20 MEQ PO TBCR
40.0000 meq | EXTENDED_RELEASE_TABLET | Freq: Two times a day (BID) | ORAL | Status: AC
  Administered 2022-02-09 – 2022-02-10 (×4): 40 meq via ORAL
  Filled 2022-02-09 (×4): qty 2

## 2022-02-09 MED ORDER — SODIUM CHLORIDE 0.9 % IV SOLN
2.0000 g | INTRAVENOUS | Status: DC
  Administered 2022-02-09 – 2022-02-27 (×18): 2 g via INTRAVENOUS
  Filled 2022-02-09 (×20): qty 20

## 2022-02-09 MED ORDER — KETOROLAC TROMETHAMINE 15 MG/ML IJ SOLN
30.0000 mg | Freq: Four times a day (QID) | INTRAMUSCULAR | Status: DC
  Administered 2022-02-09: 30 mg via INTRAVENOUS
  Filled 2022-02-09: qty 2

## 2022-02-09 MED ORDER — HYDROMORPHONE HCL 1 MG/ML IJ SOLN
1.0000 mg | INTRAMUSCULAR | Status: DC | PRN
  Administered 2022-02-09 – 2022-02-18 (×44): 1 mg via INTRAVENOUS
  Filled 2022-02-09 (×45): qty 1

## 2022-02-09 MED ORDER — POLYETHYLENE GLYCOL 3350 17 G PO PACK
17.0000 g | PACK | Freq: Every day | ORAL | Status: DC
  Administered 2022-02-09 – 2022-02-18 (×3): 17 g via ORAL
  Filled 2022-02-09 (×6): qty 1

## 2022-02-09 MED ORDER — SIMETHICONE 80 MG PO CHEW: 80.0000 mg | CHEWABLE_TABLET | Freq: Four times a day (QID) | ORAL | Status: DC | PRN

## 2022-02-09 MED ORDER — METRONIDAZOLE 500 MG/100ML IV SOLN
500.0000 mg | Freq: Two times a day (BID) | INTRAVENOUS | Status: DC
Start: 2022-02-09 — End: 2022-02-11
  Administered 2022-02-09 – 2022-02-11 (×4): 500 mg via INTRAVENOUS
  Filled 2022-02-09 (×4): qty 100

## 2022-02-09 MED ORDER — OXYCODONE HCL 5 MG PO TABS
5.0000 mg | ORAL_TABLET | Freq: Four times a day (QID) | ORAL | Status: DC | PRN
  Administered 2022-02-09 – 2022-02-11 (×6): 5 mg via ORAL
  Filled 2022-02-09 (×7): qty 1

## 2022-02-09 NOTE — Progress Notes (Addendum)
  RLQ abscess drain  Drain Location: RLQ Size: Fr size: 12 Fr Date of placement: 02/05/22  Currently to: Drain collection device: suction bulb 24 hour output:  Output by Drain (mL) 02/07/22 0701 - 02/07/22 1900 02/07/22 1901 - 02/08/22 0700 02/08/22 0701 - 02/08/22 1900 02/08/22 1901 - 02/09/22 0700 02/09/22 0701 - 02/09/22 1107  Closed System Drain 1 Right;Inferior;Anterior RLQ Other (Comment) 12 Fr.         Interval imaging/drain manipulation:  CT 02/08/22: IMPRESSION: Interval placement of percutaneous drainage catheter within air-fluid collection in the right lower abdomen with interval decrease in size. There is 7.2 cm loculated fluid collection in the cul-de-sac in the pelvis with slightly enhancing margins. There is another 4 cm loculated fluid collection in the left side of the pelvis. These may suggest loculated serous fluid collections or abscesses. Small ascites is noted in the perihepatic region.  Current examination: Flushes easily/ aspiration more difficult but do able Insertion site unremarkable. Suture and stat lock in place. Dressed appropriately.  Little to no OP at this time---- ordered flushes for TID and removed stop cock Drain is now directly connected to drain tubing Does seem to have some flow at this time  Plan: Continue TID flushes with 5 cc NS. Record output Q shift. Dressing changes QD or PRN if soiled.  Call IR APP or on call IR MD if difficulty flushing or sudden change in drain output.  Repeat imaging/possible drain injection once output < 10 mL/QD (excluding flush material.)  Discharge planning: Please contact IR APP or on call IR MD prior to patient d/c to ensure appropriate follow up plans are in place. Typically patient will follow up with IR clinic 10-14 days post d/c for repeat imaging/possible drain injection. IR scheduler will contact patient with date/time of appointment. Patient will need to flush drain QD with 5 cc NS, record output QD,  dressing changes every 2-3 days or earlier if soiled.   IR will continue to follow - please call with questions or concerns.

## 2022-02-09 NOTE — Hospital Course (Addendum)
She feels very depressed and this is impacting her ability to eat food. She does not eat much when she is feeling down. She does not mean too, but feels very angry at night because she is staying here and not at home with her family. She goes to [facility] and gets an injection medicine once a month. Last month was her last injection, will call her mom to figure out when exactly. Abilify and seroquel for her meds via injection.

## 2022-02-09 NOTE — TOC Initial Note (Signed)
Transition of Care [facility]) - Initial/Assessment Note    Patient Details  Name: Paula Lucas MRN: [id number] Date of Birth: 11/22/1990  [facility]) CM/SW Contact:    Lawerance Sabal, RN Phone Number: 02/09/2022, 2:39 PM  Clinical Narrative:                Sherron Monday w patient at bedside.  She states that she lives with her at the In [address] [employer], homeless resources are not indicated. Her mother drives, she does not. Pharmacy is [facility]. Discussed need for follow up after DC and medications, she is agreeable to appointment at any [facility]. TOC can follow for MATCH needs, please utilize [facility] pharmacy at DC. Patient with percutaneous drain to abd with TID flushes. Per MD notes following drain output to determine if repositioning or additional drain may be needed, there is a possibility she may need long term antibiotics, not sure if PO or IV. Currently on Ceftriaxone Flagyl and Doxy, dilaudid for pain.  TOC following for possible need for charity [facility] RN for drain care/ flushes.   6/21 Spoke w IM team and they will schedule her follow up in their office for primary care at DC.      Expected Discharge Plan: Home/Self Care Barriers to Discharge: Continued Medical Work up   Patient Goals and CMS Choice Patient states their goals for this hospitalization and ongoing recovery are:: return home      Expected Discharge Plan and Services Expected Discharge Plan: Home/Self Care   Discharge Planning Services: CM Consult, Indigent Health Clinic, Medication Assistance   Living arrangements for the past 2 months: Hotel/Motel                 DME Arranged: N/A           HH Agency: NA        Prior Living Arrangements/Services Living arrangements for the past 2 months: Hotel/Motel Lives with:: Parents                   Activities of Daily Living Home Assistive Devices/Equipment: None ADL Screening (condition at time of admission) Patient's  cognitive ability adequate to safely complete daily activities?: Yes Is the patient deaf or have difficulty hearing?: No Does the patient have difficulty seeing, even when wearing glasses/contacts?: No Does the patient have difficulty concentrating, remembering, or making decisions?: No Patient able to express need for assistance with ADLs?: Yes Does the patient have difficulty dressing or bathing?: No Independently performs ADLs?: Yes (appropriate for developmental age) Does the patient have difficulty walking or climbing stairs?: No Weakness of Legs: None Weakness of Arms/Hands: None  Permission Sought/Granted                  Emotional Assessment              Admission diagnosis:  Pelvic abscess in female [N73.9] Sepsis, due to unspecified organism, unspecified whether acute organ dysfunction present [facility]) [A41.9] Patient Active Problem List   Diagnosis Date Noted   Left lower quadrant abdominal pain    Sepsis (HCC) 02/04/2022   Pelvic abscess     PCP:  Pcp, No Pharmacy:   [id number] - [place], [place] - [address] [place] [address] Phone: [phone] Fax: [phone]     Social Determinants of Health (SDOH) Interventions    Readmission Risk Interventions     No data to display

## 2022-02-09 NOTE — Progress Notes (Signed)
Subjective: Patient reports tolerating PO, + flatus, + BM, and no problems voiding.  She still reports pain and doesn't feel like it is controlled. She does feel better however compared to yesterday.   Objective: Today's Vitals   02/09/22 0846 02/09/22 0955 02/09/22 1023 02/09/22 1200  BP:    103/67  Pulse:      Resp:    18  Temp:    98.2 F (36.8 C)  TempSrc:    Oral  SpO2:      Weight:      Height:      PainSc: 8  9  8      Body mass index is 30.03 kg/m.   General: alert, cooperative, and no distress Resp: clear to auscultation bilaterally Cardio: regular rate and rhythm GI: normal findings: bowel sounds normal and abnormal findings:  moderate tenderness in the entire abdomen Extremities: no edema, redness or tenderness in the calves or thighs and SCDs on  Output by Drain (mL) 02/07/22 0701 - 02/07/22 1900 02/07/22 1901 - 02/08/22 0700 02/08/22 0701 - 02/08/22 1900 02/08/22 1901 - 02/09/22 0700 02/09/22 0701 - 02/09/22 1231  Closed System Drain 1 Right;Inferior;Anterior RLQ Other (Comment) 12 Fr.       - See IR note re: drain   Assessment/Plan: - Clinical improvement on antibiotics overnight and she appears improved as well. She feels like with flatus she has had some relief. We discussed that the infection was likely impacting her bowel function and now that some function has resumed, her pain may also improve. I also expect pain to lag in terms of improvement in symptoms as the last measure.  - Agree with changing to Ceftriaxone. Continue Flagyl and Doxy.  - Pain control as needed. Consider PO pain medication for longer acting benefit.  - If drain putting out minimal despite flushes, given the other locations of fluid especially in the posterior cul-de-sac, I think it would be worth re-visiting the idea of re-positioning the drain or placing a new one. Alternatively, she can continue long term antibiotics and would consider consulting ID for long term plan for home and follow  up.  - Given the complexity and risk of surgery in this setting, I would advise doing surgery for this only as a last resort. Can continue regular diet.  - Will add simethicone for gas pain symptoms which may help her discomfort.  - Will continue to follow along daily   LOS: 0 days    02/11/22, MD 02/09/2022, 12:31 PM

## 2022-02-09 NOTE — Progress Notes (Signed)
   Subjective: Patient seen and evaluated at bedside. She endorse significant tenderness of the abd, worse in the LLQ. Also, experiencing dysuria, nausea, subjective fevers, chills and sweats. Last bowel movement was earlier this morning.  Objective:  Vital signs in last 24 hours: Vitals:   02/08/22 2358 02/09/22 0425 02/09/22 0625 02/09/22 0813  BP: 128/82 105/75  106/71  Pulse: (!) 110 91  100  Resp: 20 18  18   Temp: 99.2 F (37.3 C) 98.6 F (37 C)  99 F (37.2 C)  TempSrc: Oral Oral  Oral  SpO2: 96% 95%    Weight:   76.9 kg   Height:   5\' 3"  (1.6 m)    Physical Exam Constitutional:      Appearance: She is ill-appearing.     Interventions: Nasal cannula in place.  HENT:     Head: Normocephalic and atraumatic.  Cardiovascular:     Rate and Rhythm: Tachycardia present.     Heart sounds: Normal heart sounds.  Pulmonary:     Effort: Pulmonary effort is normal.     Breath sounds: Normal air entry.  Abdominal:     General: Bowel sounds are normal.     Tenderness: There is abdominal tenderness.     Comments: Drain placed in RLQ  Skin:    General: Skin is warm and moist.  Neurological:     General: No focal deficit present.      Assessment/Plan:  Principal Problem:   Pelvic abscess  Active Problems:   Sepsis (HCC)   Left lower quadrant abdominal pain  Paula Lucas is a 31 year old female with a past medical history of recent admission for tubo-ovarian abscess complicated by septic shock (02/04/22 - 02/07/22) with previous history of tubo-ovarian abscess 2/2 N. Gonorrhea in 2019 who presents to the ED with c/o nausea, vomiting and abdominal pain, currently admitted for multiple pelvic abscesses.   Pelvic Abscesses On this admission, CT A/P showed interval decrease in size of the right lower abdomen abscess with the drain in it, but new interval development of a 7.2cm loculated fluid collection in the cul-de-sac and a 4cm loculated collection in the left side of the pelvis  compared to prior CT on prior admission (6/16-6/18/23). OB/GYN and surgery consulted and are on board. Blood & surgical cultures still NGT. White count is trending down. Patient remains tachycardic and hypotensive, noting subjective fevers, chills and sweats. She appears acutely ill and despite IV abx, minimal signs of improvement noted. Abdomen still quite tender. Patient notes continued dysuria and pelvic pressure. Not much output from right JP drain. Per pharmacy and Dr. 2020 (OB/GYN) will adjust abx coverage; switch cefepime to ceftriaxone. -IV ceftriaxone, flagyl, doxycycline -Consider switching flagyl and doxycycline to oral once pt able to tolerate p.o -Pain control with Dilaudid and Toradol  -Reassess improvement over the next 48hrs -Surgery following, recommendations appreciated -OB/GYN following, recommendations appreciated  Iron deficiency anemia Current hgb steady at ~8.9. Iron studies significant for low iron level of 15 and low iron saturations of 8. In the setting of acute infection, will hold off on iron supplementation. -Monitor for active signs of bleeding -Transfuse if hgb <7  Hypokalemia Labs significant for hypokalemia of 2.9. -Potassium tabs 12[birth year]-1978 BID     Prior to Admission Living Arrangement: Anticipated Discharge Location: Barriers to Discharge: Dispo: Anticipated discharge in approximately 1-4 day(s).   Para March, MD 02/09/2022, 10:50 AM Pager: [phone] After 5pm on weekdays and 1pm on weekends: On Call pager [phone]

## 2022-02-10 ENCOUNTER — Encounter (HOSPITAL_COMMUNITY): Payer: Self-pay | Admitting: Internal Medicine

## 2022-02-10 ENCOUNTER — Inpatient Hospital Stay (HOSPITAL_COMMUNITY): Payer: Medicaid Other

## 2022-02-10 LAB — MAGNESIUM: Magnesium: 1.7 mg/dL (ref 1.7–2.4)

## 2022-02-10 LAB — AEROBIC/ANAEROBIC CULTURE W GRAM STAIN (SURGICAL/DEEP WOUND)
Culture: NO GROWTH
Gram Stain: NONE SEEN

## 2022-02-10 LAB — BASIC METABOLIC PANEL
Anion gap: 9 (ref 5–15)
BUN: 6 mg/dL (ref 6–20)
CO2: 25 mmol/L (ref 22–32)
Calcium: 7.9 mg/dL — ABNORMAL LOW (ref 8.9–10.3)
Chloride: 103 mmol/L (ref 98–111)
Creatinine, Ser: 0.74 mg/dL (ref 0.44–1.00)
GFR, Estimated: >60 mL/min (ref >60)
Glucose, Bld: 119 mg/dL — ABNORMAL HIGH (ref 70–99)
Potassium: 3 mmol/L — ABNORMAL LOW (ref 3.5–5.1)
Sodium: 137 mmol/L (ref 135–145)

## 2022-02-10 LAB — CBC
HCT: 27.4 % — ABNORMAL LOW (ref 36.0–46.0)
Hemoglobin: 9.2 g/dL — ABNORMAL LOW (ref 12.0–15.0)
MCH: 28.8 pg (ref 26.0–34.0)
MCHC: 33.6 g/dL (ref 30.0–36.0)
MCV: 85.9 fL (ref 80.0–100.0)
Platelets: 546 10*3/uL — ABNORMAL HIGH (ref 150–400)
RBC: 3.19 MIL/uL — ABNORMAL LOW (ref 3.87–5.11)
RDW: 15.4 % (ref 11.5–15.5)
WBC: 15 10*3/uL — ABNORMAL HIGH (ref 4.0–10.5)
nRBC: 0.1 % (ref 0.0–0.2)

## 2022-02-10 MED ORDER — HYDROMORPHONE HCL 1 MG/ML IJ SOLN
INTRAMUSCULAR | Status: AC
  Filled 2022-02-10: qty 1

## 2022-02-10 MED ORDER — MIDAZOLAM HCL 2 MG/2ML IJ SOLN
INTRAMUSCULAR | Status: AC
  Filled 2022-02-10: qty 4

## 2022-02-10 MED ORDER — HYDROMORPHONE HCL 1 MG/ML IJ SOLN
INTRAMUSCULAR | Status: AC | PRN
  Administered 2022-02-10: 1 mg via INTRAVENOUS

## 2022-02-10 MED ORDER — LIDOCAINE HCL 1 % IJ SOLN
INTRAMUSCULAR | Status: AC
  Filled 2022-02-10: qty 10

## 2022-02-10 MED ORDER — KETOROLAC TROMETHAMINE 30 MG/ML IJ SOLN
30.0000 mg | Freq: Four times a day (QID) | INTRAMUSCULAR | Status: AC | PRN
  Administered 2022-02-10 – 2022-02-11 (×3): 30 mg via INTRAVENOUS
  Filled 2022-02-10 (×5): qty 1

## 2022-02-10 MED ORDER — LACTATED RINGERS IV BOLUS
1000.0000 mL | Freq: Once | INTRAVENOUS | Status: AC
Start: 2022-02-10 — End: 2022-02-10
  Administered 2022-02-10: 1000 mL via INTRAVENOUS

## 2022-02-10 MED ORDER — FENTANYL CITRATE (PF) 100 MCG/2ML IJ SOLN
INTRAMUSCULAR | Status: AC
  Filled 2022-02-10: qty 4

## 2022-02-10 MED ORDER — HYDROXYZINE HCL 25 MG PO TABS
25.0000 mg | ORAL_TABLET | Freq: Three times a day (TID) | ORAL | Status: DC | PRN
  Administered 2022-02-10 – 2022-02-24 (×12): 25 mg via ORAL
  Filled 2022-02-10 (×16): qty 1

## 2022-02-10 MED ORDER — MIDAZOLAM HCL 2 MG/2ML IJ SOLN
INTRAMUSCULAR | Status: AC | PRN
  Administered 2022-02-10 (×4): 1 mg via INTRAVENOUS

## 2022-02-10 MED ORDER — MAGNESIUM SULFATE 2 GM/50ML IV SOLN
2.0000 g | Freq: Once | INTRAVENOUS | Status: AC
  Administered 2022-02-10: 2 g via INTRAVENOUS
  Filled 2022-02-10: qty 50

## 2022-02-10 MED ORDER — FENTANYL CITRATE (PF) 100 MCG/2ML IJ SOLN
INTRAMUSCULAR | Status: AC | PRN
  Administered 2022-02-10 (×6): 25 ug via INTRAVENOUS

## 2022-02-10 MED ORDER — ENOXAPARIN SODIUM 40 MG/0.4ML IJ SOSY
40.0000 mg | PREFILLED_SYRINGE | INTRAMUSCULAR | Status: DC
  Administered 2022-02-11 – 2022-02-15 (×5): 40 mg via SUBCUTANEOUS
  Filled 2022-02-10 (×5): qty 0.4

## 2022-02-10 NOTE — Progress Notes (Signed)
Overnight:Patient was noted to have tachycardia up to 117 HR and new-onset fever of 100.9.  She remains hypotensive.  Acute worsening of abdominal pain and development of labia tenderness and swelling.  Subjective: Patient seen and evaluated at bedside.  She reports worsening in her abdominal pain.  She denies chest pain or shortness of breath.  She reports chills.  And vaginal tenderness and labial swelling that arise overnight.  Overall, feeling very unwell and worse since admission.  Objective:  Vital signs in last 24 hours: Vitals:   02/10/22 1325 02/10/22 1330 02/10/22 1343 02/10/22 1355  BP: 94/62 98/67 98/70  102/66  Pulse: 90 88    Resp: 20 15 16 19   Temp:    99.8 F (37.7 C)  TempSrc:    Oral  SpO2: 100% 100%    Weight:      Height:       Physical Exam Constitutional:      General: She is in acute distress.     Appearance: She is ill-appearing.  HENT:     Head: Normocephalic and atraumatic.  Cardiovascular:     Rate and Rhythm: Tachycardia present.     Heart sounds: Normal heart sounds.  Pulmonary:     Effort: Pulmonary effort is normal.     Breath sounds: Normal breath sounds and air entry.  Abdominal:     General: There is distension.     Palpations: Abdomen is soft.     Tenderness: There is abdominal tenderness. There is guarding.  Genitourinary:    Labia:        Right: Tenderness present.        Left: Tenderness present.      Comments: Tenderness, redness and swelling of the labia.  Whitish discharge noted. Musculoskeletal:     Right lower leg: No edema.     Left lower leg: No edema.  Skin:    General: Skin is warm and dry.  Neurological:     General: No focal deficit present.     Mental Status: She is oriented to person, place, and time.  Psychiatric:        Mood and Affect: Mood is anxious.     Assessment/Plan:  Principal Problem:   Pelvic abscess  Active Problems:   Sepsis (HCC)   Left lower quadrant abdominal pain  Ms. Kavan is a  31 year old female with a past medical history of recent admission for tubo-ovarian abscess complicated by septic shock (02/04/22 - 02/07/22) with previous history of tubo-ovarian abscess 2/2 N. Gonorrhea in 2019 who presents to the ED with c/o nausea, vomiting and abdominal pain, currently admitted for multiple pelvic abscesses.   Multiple pelvic abscesses Overnight, patient became septic with new onset low-grade fever, tachycardia and worsening leukocytosis.  She appears acutely ill on exam.  Abdominal pain out of proportion to exam concerning for peritonitis.  She also has new symptom of vaginal labial swelling and tenderness.  Likely related to ongoing pelvic infection.  Minimal output noted of JP drain.  Per OB/GYN and surgery patient will need replacement of drain with hopes of improve drainage.  New drain placed by surgery today.  Approximately 65 cc output with culture of fluid.  There is high risk for complications if surgery is indicated.  Surgery will be a last resort option.  Since the input of new drains, monitor for improvement.  Continue current antibiotic regimen at this time. --IV ceftriaxone, Flagyl, doxycycline - Pain control with Dilaudid and Toradol - IV LR 1 L fluid  bolus; give additional 1 L bolus if tachycardia persists - Reassess improvement over the next 48 hours - Surgery following, recommendations appreciated - OB/GYN following, recommendations appreciated  Iron deficiency anemia We will hold off on iron supplementation in the setting of acute infection. - Transfuse if hemoglobin is less than 7 Monitor for active signs of bleeding  Hypokalemia Despite potassium tab repletion, potassium levels low at 3.  Magnesium levels assessed and within normal limits at 1.7. - Continue potassium tabs 40 mEq twice daily - Magnesium push x1 dose     Prior to Admission Living Arrangement: Anticipated Discharge Location: Barriers to Discharge: Dispo: Anticipated discharge in  approximately 1-2 day(s).   Dellis Filbert, MD 02/10/2022, 3:51 PM Pager: [phone] After 5pm on weekdays and 1pm on weekends: On Call pager [phone]

## 2022-02-10 NOTE — TOC Progression Note (Signed)
Transition of Care [facility]) - Progression Note    Patient Details  Name: [name] MRN: [id number] Date of Birth: [birth year]  Transition of Care [facility] CM/SW Contact  Lawerance Sabal, RN Phone Number: 02/10/2022, 11:56 AM  Clinical Narrative:    Frances Furbish agreeable to providing [facility] RN for drain and drain site care. PATIENT WILL NEED TO BE ABLE TO CARE FOR DRAIN AT DC AS HH NURSE WILL NOT COME TO THE HOUSE EVERYDAY.  Requested nursing staff to start educating her on drain care yesterday    Expected Discharge Plan: Home/Self Care Barriers to Discharge: Continued Medical Work up  Expected Discharge Plan and Services Expected Discharge Plan: Home/Self Care   Discharge Planning Services: CM Consult   Living arrangements for the past 2 months: Hotel/Motel                 DME Arranged: N/A         HH Arranged: RN HH Agency: [facility] Date [facility] Agency Contacted: 02/10/22 Time HH Agency Contacted: 1155 Representative spoke with at [facility] Agency: Kandee Keen   Social Determinants of Health (SDOH) Interventions    Readmission Risk Interventions     No data to display

## 2022-02-10 NOTE — Progress Notes (Signed)
MEDICATION-RELATED CONSULT NOTE   IR Procedure Consult - Anticoagulant/Antiplatelet PTA/Inpatient Med List Review by Pharmacist   Procedure: TOA drain exchange and repositioning    Completed: 1:45pm  Post-Procedural bleeding risk per IR MD assessment:  standard  Antithrombotic medications on inpatient or PTA profile prior to procedure:      SCDs, with plan to add Lovenox for VTE prophylaxis if no further procedures  Recommended restart time per IR Post-Procedure Guidelines:      Post-procedure day   Plan:       Per d/w Dr. Huel Cote, will begin Lovenox 40 mg SQ q24h on 02/11/22.  Dennie Fetters, [place] 02/10/2022 2:48 PM

## 2022-02-10 NOTE — Procedures (Signed)
Pre procedural Dx: TOA abscesses Post procedural Dx: Same  Technically successful CT guided placed of a 10 Fr drainage catheter placement into the residual undrained component of TOA yielding 65 cc of purulent appearing serous fluid.  A representative aspirated sample was capped and sent to the laboratory for analysis.  Drain connected to gravity bag.  Successful CT guided exchanged and repositioning of pre-existing 12 Fr TOA drain within the anterior aspect of the lower abdomen/pelvis.  Drain connected to gravity bag.   EBL: Trace Complications: None immediate  Katherina Right, MD Pager #: [phone]

## 2022-02-10 NOTE — H&P (Signed)
Chief Complaint: Patient was seen in consultation today for pelvic fluid collection s/p drain placement 6/16 Chief Complaint  Patient presents with   Abdominal Pain   at the request of Milas Hock, MD  Referring Physician(s): Milas Hock, MD  Supervising Physician: Simonne Come  Patient Status: [facility] - In-pt  History of Present Illness: Paula Lucas is a 31 y.o. female with PMH including chlamydia, gonorrhea, tubo-ovarian abscess, asthma, and HTN. She is known to IR after presenting on 6/16 for drain placement secondary to a complex fluid collection in the pelvis. Follow-up CT on 6/19 revealed a 7.2cm loculated fluid collection. Impression of this CT is below. Pt is concerned about their pain today, stating that they are in significant abdominal pain.   Past Medical History:  Diagnosis Date   Asthma    BMI 31.0-31.9,adult    Chlamydia    Gonorrhea    TOA (tubo-ovarian abscess)     Past Surgical History:  Procedure Laterality Date   INDUCED ABORTION     IR RADIOLOGIST EVAL & MGMT  05/17/2018   TONSILLECTOMY      Allergies: Fish allergy, Motrin [ibuprofen], Mushroom extract complex, Shellfish-derived products, and Tylenol [acetaminophen]  Medications: Prior to Admission medications   Medication Sig Start Date End Date Taking? Authorizing Provider  doxycycline (VIBRAMYCIN) 100 MG capsule Take 1 capsule (100 mg total) by mouth 2 (two) times daily. Patient not taking: Reported on 02/04/2022 08/04/20   Liberty Handy, PA-C  hydrOXYzine (ATARAX/VISTARIL) 25 MG tablet Take 1 tablet (25 mg total) by mouth every 6 (six) hours. Patient not taking: Reported on 02/04/2022 08/04/20   Liberty Handy, PA-C  methylPREDNISolone (MEDROL DOSEPAK) 4 MG TBPK tablet Follow package insert Patient not taking: Reported on 02/04/2022 08/10/21   Tanda Rockers, PA-C  penicillin v potassium (VEETID) 500 MG tablet Take 1 tablet (500 mg total) by mouth 3 (three) times daily. Patient not  taking: Reported on 02/04/2022 05/24/19   Mardella Layman, MD  permethrin (ELIMITE) 5 % cream Apply to affected area once Patient not taking: Reported on 02/04/2022 08/04/20   Liberty Handy, PA-C  traMADol (ULTRAM) 50 MG tablet Take 1 tablet (50 mg total) by mouth every 6 (six) hours as needed. Patient not taking: Reported on 02/04/2022 05/25/19   Eustace Moore, MD     Family History  Problem Relation Age of Onset   Diabetes Mother    Hypertension Mother    Diabetes Father    Hypertension Father    Diabetes Sister    Hypertension Sister     Social History   Socioeconomic History   Marital status: Single    Spouse name: Not on file   Number of children: Not on file   Years of education: Not on file   Highest education level: Not on file  Occupational History   Occupation: UNEMPLOYED  Tobacco Use   Smoking status: Smoker, Current Status Unknown    Packs/day: 0.50    Years: 9.00    Total pack years: 4.50    Types: Cigarettes   Smokeless tobacco: Never  Substance and Sexual Activity   Alcohol use: No   Drug use: Yes    Types: Marijuana    Comment: THC GUMMIES LAST MONTH   Sexual activity: Yes  Other Topics Concern   Not on file  Social History Narrative   Not on file   Social Determinants of Health   Financial Resource Strain: Medium Risk (04/01/2018)   Overall Financial Resource  Strain (CARDIA)    Difficulty of Paying Living Expenses: Somewhat hard  Food Insecurity: Food Insecurity Present (04/01/2018)   Hunger Vital Sign    Worried About Running Out of Food in the Last Year: Sometimes true    Ran Out of Food in the Last Year: Sometimes true  Transportation Needs: Unmet Transportation Needs (04/01/2018)   PRAPARE - Administrator, Civil Service (Medical): Yes    Lack of Transportation (Non-Medical): Yes  Physical Activity: Insufficiently Active (04/01/2018)   Exercise Vital Sign    Days of Exercise per Week: 1 day    Minutes of Exercise per Session:  120 min  Stress: No Stress Concern Present (04/01/2018)   [employer] of Occupational Health - Occupational Stress Questionnaire    Feeling of Stress : Not at all  Social Connections: Unknown (04/01/2018)   Social Connection and Isolation Panel [NHANES]    Frequency of Communication with Friends and Family: Once a week    Frequency of Social Gatherings with Friends and Family: Once a week    Attends Religious Services: Not on Marketing executive or Organizations: No    Attends Banker Meetings: Never    Marital Status: Never married     Review of Systems: A 12 point ROS discussed and pertinent positives are indicated in the HPI above.  All other systems are negative.  Review of Systems  Constitutional:  Negative for chills and fever.  Respiratory:  Negative for chest tightness and shortness of breath.   Cardiovascular:  Negative for chest pain.  Gastrointestinal:  Positive for abdominal pain. Negative for diarrhea, nausea and vomiting.  Neurological:  Negative for dizziness and headaches.  Psychiatric/Behavioral:  Negative for confusion.     Vital Signs: BP 104/66 (BP Location: Left Arm)   Pulse (!) 121   Temp (!) 100.9 F (38.3 C) (Oral)   Resp 20   Ht  (1.6 m)   Wt 169 lb 8.5 oz (76.9 kg)   SpO2 90%   BMI 30.03 kg/m     Physical Exam Constitutional:      General: She is not in acute distress. HENT:     Head: Normocephalic.  Cardiovascular:     Rate and Rhythm: Normal rate and regular rhythm.     Heart sounds: Normal heart sounds.  Pulmonary:     Effort: Pulmonary effort is normal. No respiratory distress.     Breath sounds: Normal breath sounds. No wheezing or rales.  Abdominal:     General: Bowel sounds are normal. There is no distension.     Tenderness: There is generalized abdominal tenderness. There is no rebound.  Skin:    General: Skin is warm and dry.  Neurological:     Mental Status: She is alert and oriented to  person, place, and time.  Psychiatric:        Mood and Affect: Mood is anxious.        Behavior: Behavior normal.     Imaging: CT ABDOMEN PELVIS W CONTRAST  Result Date: 02/08/2022 CLINICAL DATA:  Sepsis EXAM: CT ABDOMEN AND PELVIS WITH CONTRAST TECHNIQUE: Multidetector CT imaging of the abdomen and pelvis was performed using the standard protocol following bolus administration of intravenous contrast. RADIATION DOSE REDUCTION: This exam was performed according to the departmental dose-optimization program which includes automated exposure control, adjustment of the mA and/or kV according to patient size and/or use of iterative reconstruction technique. CONTRAST:  OMNIPAQUE IOHEXOL  300 MG/ML  SOLN COMPARISON:  Previous studies including the CT done on 02/22/22 FINDINGS: Lower chest: Small bilateral pleural effusions are seen more so on the right side. There are linear patchy infiltrates in the lingula and both lower lobes suggesting atelectasis/pneumonia. Hepatobiliary: No focal abnormality is seen in the liver. There is no dilation of bile ducts. Gallbladder is unremarkable. Small ascites is noted in the perihepatic region. Pancreas: No focal abnormality is seen. Spleen: Unremarkable. Adrenals/Urinary Tract: Adrenals are not enlarged. There is no hydronephrosis. There are no renal or ureteral stones. Urinary bladder is unremarkable. There is 3 mm low-density in the midportion of left kidney which is too small to be characterized. Stomach/Bowel: Stomach is not distended. Small bowel loops are not dilated. Appendix is not seen. There is no significant wall thickening in the colon. There is interval placement of percutaneous drainage catheter with its tip in the right lower abdomen. There is interval decrease in size of the complex fluid collection in the right lower quadrant. There is 4 cm loculated fluid collection slightly to the left of midline at the level of pelvic inlet. There is 7.2 x 5.3 x  4.9 cm loculated fluid collection with minimally enhancing margin in the cul-de-sac in the pelvis. There are no pockets of air within these loculated fluid collections. There is stranding in the fat planes in the lower abdomen and pelvis. Vascular/Lymphatic: Vascular structures are unremarkable. Reproductive: Uterus is to the right of midline. Other: There is no pneumoperitoneum. Musculoskeletal: No acute findings are seen. IMPRESSION: Interval placement of percutaneous drainage catheter within air-fluid collection in the right lower abdomen with interval decrease in size. There is 7.2 cm loculated fluid collection in the cul-de-sac in the pelvis with slightly enhancing margins. There is another 4 cm loculated fluid collection in the left side of the pelvis. These may suggest loculated serous fluid collections or abscesses. Small ascites is noted in the perihepatic region. There is no evidence of intestinal obstruction or pneumoperitoneum. There is no hydronephrosis. Small bilateral pleural effusions. There are linear patchy infiltrates in both lower lung fields suggesting atelectasis/pneumonia. Electronically Signed   By: Ernie Avena M.D.   On: 02/08/2022 09:41   CT IMAGE GUIDED DRAINAGE BY PERCUTANEOUS CATHETER  Result Date: 02/05/2022 INDICATION: PID with large abdominopelvic fluid collection. EXAM: CT GUIDED DRAINAGE CATHETER PLACEMENT INTO ABDOMINOPELVIC FLUID COLLECTION RADIATION DOSE REDUCTION: This exam was performed according to the departmental dose-optimization program which includes automated exposure control, adjustment of the mA and/or kV according to patient size and/or use of iterative reconstruction technique. COMPARISON:  CT AP, 02/22/22 MEDICATIONS: The patient is currently admitted to the hospital and receiving intravenous antibiotics. The antibiotics were administered within an appropriate time frame prior to the initiation of the procedure. ANESTHESIA/SEDATION: Moderate  (conscious) sedation was employed during this procedure. A total of Versed 3 mg and Fentanyl 75 mcg was administered intravenously. Additionally, 1 mg Dilaudid was administered IV. Moderate Sedation Time: 19 minutes. The patient's level of consciousness and vital signs were monitored continuously by radiology nursing throughout the procedure under my direct supervision. CONTRAST:  None COMPLICATIONS: None immediate. PROCEDURE: Informed written consent was obtained from the patient and/or patient's representative after a discussion of the risks, benefits and alternatives to treatment. The patient was placed supine on the CT gantry and a pre procedural CT was performed re-demonstrating the known abscess/fluid collection within the lower abdomen/pelvis. The procedure was planned. A timeout was performed prior to the initiation of the procedure. The RIGHT lower  quadrant was prepped and draped in the usual sterile fashion. The overlying soft tissues were anesthetized with 1% lidocaine with epinephrine. Appropriate trajectory was planned with the use of a 22 gauge spinal needle. An 18 gauge trocar needle was advanced into the abscess/fluid collection and a short Amplatz super stiff wire was coiled within the collection. Appropriate positioning was confirmed with a limited CT scan. The tract was serially dilated allowing placement of a 12 [place] all-purpose drainage catheter. Appropriate positioning was confirmed with a limited postprocedural CT scan. 5 mL ml of serous fluid was aspirated and submitted for analysis. The tube was connected to a drainage bag and sutured in place. A dressing was placed. The patient tolerated the procedure well without immediate post procedural complication. IMPRESSION: Successful CT guided placement of a 12 Fr drainage catheter into the abdominopelvic collection with aspiration of serous fluid, as above. Samples were sent to the laboratory as requested by the ordering clinical team. Roanna Banning, MD [facility] Radiology Electronically Signed   By: Roanna Banning M.D.   On: 02/05/2022 18:08   CT ABDOMEN PELVIS WO CONTRAST  Result Date: 02/04/2022 CLINICAL DATA:  Abdominal pain, acute, nonlocalized septic shock, abnormal uterine US, concern for PID EXAM: CT ABDOMEN AND PELVIS WITHOUT CONTRAST TECHNIQUE: Multidetector CT imaging of the abdomen and pelvis was performed following the standard protocol without IV contrast. RADIATION DOSE REDUCTION: This exam was performed according to the departmental dose-optimization program which includes automated exposure control, adjustment of the mA and/or kV according to patient size and/or use of iterative reconstruction technique. COMPARISON:  Pelvic ultrasound today FINDINGS: Lower chest: Pleural thickening noted peripherally in the right lower hemithorax, possibly representing trace loculated pleural effusion. No confluent airspace opacities. Hepatobiliary: No focal hepatic abnormality. Gallbladder unremarkable. Pancreas: No focal abnormality or ductal dilatation. Spleen: No focal abnormality.  Normal size. Adrenals/Urinary Tract: No adrenal abnormality. No focal renal abnormality. No stones or hydronephrosis. Urinary bladder is unremarkable. Stomach/Bowel: Wall thickening noted in the cecum which is immediately adjacent to the large pelvic fluid collection described below. This could reflect colitis or be secondarily inflamed. Stomach and small bowel grossly unremarkable. Vascular/Lymphatic: No evidence of aneurysm or adenopathy. Reproductive: Large complex cystic area or fluid collection seen in the pelvis extending from the cul-de-sac superiorly into the right lower quadrant adjacent to the cecum. This measures approximately 12 x 8 cm on image 64 of series 3. Internal areas of increased density. This could reflect blood or abscess/PID. Uterus grossly unremarkable on this noncontrast study. Ovaries not  definitively visualized. Other: There is free fluid adjacent to the liver and spleen. Musculoskeletal: No acute bony abnormality. IMPRESSION: Complex process in the pelvis including what appears to be a large complex fluid collection measuring 12 x 8 cm extending from the cul-de-sac superiorly to the cecum which could reflect blood products or abscess. This also could conceivably be related to and ovary. Neither ovary definitively identified. Wall thickening in the cecum could reflect colitis or secondary inflammation related to the pelvic process. Perihepatic and perisplenic ascites. Possible trace loculated right pleural effusion peripherally in the right lower hemithorax. Electronically Signed   By: Charlett Nose M.D.   On: 02/04/2022 22:29   US PELVIC COMPLETE WITH TRANSVAGINAL  Addendum Date: 02/04/2022   ADDENDUM REPORT: 02/04/2022 13:44 ADDENDUM: In light of negative quantitative beta HCG and no documented pregnancy the possibility of infectious causes for the above findings may be more likely but findings are essentially quite nonspecific  at this time. Even noncontrast CT could prove beneficial to allow for assessment of pelvic fluid to determine whether this represents frank hemoperitoneum and to assess for any associated inflammatory changes that may be present not well assessed on ultrasound. These results were called by telephone at the time of interpretation on 02/04/2022 at 1:44 pm to provider [facility] , who verbally acknowledged these results. Electronically Signed   By: Donzetta KohutGeoffrey  Wile M.D.   On: 02/04/2022 13:44   Result Date: 02/04/2022 CLINICAL DATA:  A 31 year old female presents with pelvic pain and bleeding. Patient reports positive pregnancy but urine pregnancy currently negative by report and beta HCG is currently pending. EXAM: TRANSABDOMINAL AND TRANSVAGINAL ULTRASOUND OF PELVIS TECHNIQUE: Both transabdominal and transvaginal ultrasound examinations of the pelvis were performed.  Transabdominal technique was performed for global imaging of the pelvis including uterus, ovaries, adnexal regions, and pelvic cul-de-sac. It was necessary to proceed with endovaginal exam following the transabdominal exam to visualize the endometrium and adnexa. COMPARISON:  No recent imaging is available for comparison. FINDINGS: Uterus Measurements: 12.6 x 8.0 x 8.5 (volume = 450 cc) cm. = volume: 450 mL. Heterogeneous uterus with thickened endometrium. Cystic area in the endometrial canal that measures 4.5 x 2.6 x 4.4 cm. This cystic characteristics and central increased echogenicity. Central increased echogenicity the without discrete finding that would suggest embryo with very limited assessment due to patient discomfort and clinical condition. Endometrium Thickness: 3.3 cm. Thickened with cystic area in the fundus of the uterus. Right ovary Measurements: 3.2 x 2.7 x 2.1 (volume = 9.5 cc) cm = volume: 9.5 mL. Normal appearance/no adnexal mass. Left ovary Not visible, in the area of the LEFT adnexa is heterogeneous material that has the appearance of blood products but conforms to the cul-de-sac and LEFT adnexa. Other findings Moderate heterogeneous fluid in the LEFT adnexa primarily but also tracking along the RIGHT uterus. IMPRESSION: Unusual cystic area in the endometrial canal. If this patient has any history of recent positive pregnancy test this could represent a failed gestation with retained products of conception. Pseudo gestational sac with areas of internal hemorrhage in the setting of ruptured ectopic pregnancy is also considered. Heterogeneous appearance of material in the cul-de-sac could also potentially represent sequela of pyogenic infection though this is lower on the differential at this time based on appearance. If the patient could tolerate further evaluation with cross-sectional imaging CT could potentially be helpful. Ultimately gyn consultation is suggested. Critical Value/emergent  results were called by telephone at the time of interpretation on 02/04/2022 at 11:01 am to [facility] , who verbally acknowledged these results. Electronically Signed: By: Donzetta KohutGeoffrey  Wile M.D. On: 02/04/2022 11:01   DG Chest Port 1 View  Result Date: 02/04/2022 CLINICAL DATA:  31 year old female with history of lower abdominal pain and vaginal bleeding 3 days ago. Fever. Low blood pressure. EXAM: PORTABLE CHEST 1 VIEW COMPARISON:  Chest x-ray 08/09/2021. FINDINGS: Lung volumes are slightly low. No consolidative airspace disease. No pleural effusions. No pneumothorax. No evidence of pulmonary edema. Heart size is normal. The patient is rotated to the right on today's exam, resulting in distortion of the mediastinal contours and reduced diagnostic sensitivity and specificity for mediastinal pathology. IMPRESSION: 1. No radiographic evidence of acute cardiopulmonary disease. Electronically Signed   By: Trudie Reedaniel  Entrikin M.D.   On: 02/04/2022 10:55    Labs:  CBC: Recent Labs    02/07/22 0015 02/08/22 0106 02/09/22 0308 02/10/22 0440  WBC 17.2* 16.9* 13.7* 15.0*  HGB 9.5* 10.1* 8.9* 9.2*  HCT 28.5* 30.7* 26.5* 27.4*  PLT 475* 518* 460* 546*    COAGS: Recent Labs    02/04/22 0901  INR 1.2  APTT 25    BMP: Recent Labs    02/07/22 0015 02/08/22 0106 02/09/22 0308 02/10/22 0440  NA 131* 135 136 137  K 3.5 3.3* 2.9* 3.0*  CL 97* 93* 101 103  CO2 27 28 27 25   GLUCOSE 108* 103* 111* 119*  BUN 7 6 9 6   CALCIUM 7.7* 8.6* 8.0* 7.9*  CREATININE 0.84 0.71 0.61 0.74  GFRNONAA >60 >60 >60 >60    LIVER FUNCTION TESTS: Recent Labs    02/04/22 0901 02/05/22 0347 02/08/22 0106 02/09/22 0308  BILITOT 1.8* 1.2 0.7 0.4  AST 60* 28 32 26  ALT 78* 48* 30 22  ALKPHOS 90 74 71 48  PROT 7.8 6.6 7.1 5.7*  ALBUMIN 2.7* 2.0* 2.0* 1.6*    TUMOR MARKERS: No results for input(s): "AFPTM", "CEA", "CA199", "CHROMGRNA" in the last 8760 hours.  Assessment and Plan: Syndi Pua  is a 31yo female known to IR being evaluated today for a complex pelvic fluid collection seen on CT 6/19. She is s/p image-guided drain placement 6/16, and has reported abdominal pain since this time. On exam today, the patient was alert and lying in bed. She stated she has not eaten or drank anything today. Dr. 7/19 has reviewed imaging and approved patient for image-guided drain placement for 6/21 if possible. Pt is agreeable to proceed after her questions were answered.  Risks and benefits discussed with the patient including bleeding, infection, damage to adjacent structures, bowel perforation/fistula connection, and sepsis.  All of the patient's questions were answered, patient is agreeable to proceed. Consent signed and in chart.    Thank you for this interesting consult.  I greatly enjoyed meeting Paula Lucas and look forward to participating in their care.  A copy of this report was sent to the requesting provider on this date.  Electronically Signed: 7/21, PA-C 02/10/2022, 10:06 AM   I spent a total of 15 minutes in face to face in clinical consultation, greater than 50% of which was counseling/coordinating care for pelvic fluid collection with possible image-guided drain placement for 02/10/22.

## 2022-02-10 NOTE — Progress Notes (Signed)
Subjective: Patient reports + flatus, + BM, and no problems voiding.  She still reports pain and doesn't feel like it is controlled. She feels about the same for yesterday. Feels hot. She is NPO.   Objective: Today's Vitals   02/10/22 0810 02/10/22 0902 02/10/22 0956 02/10/22 1026  BP: 104/66     Pulse: (!) 121     Resp: 20     Temp: (!) 100.9 F (38.3 C)     TempSrc: Oral     SpO2: 90%     Weight:      Height:      PainSc:  9  8  0-No pain   Body mass index is 30.03 kg/m.   General: alert, cooperative, and no distress Resp: clear to auscultation bilaterally Cardio: regular rate and rhythm GI: normal findings: bowel sounds normal and abnormal findings:  diffusely tenderness in the entire abdomen, + rebound Extremities: no edema, redness or tenderness in the calves or thighs and SCDs on  Output by Drain (mL) 02/08/22 0701 - 02/08/22 1900 02/08/22 1901 - 02/09/22 0700 02/09/22 0701 - 02/09/22 1900 02/09/22 1901 - 02/10/22 0700 02/10/22 0701 - 02/10/22 1104  Closed System Drain 1 Right;Inferior;Anterior RLQ Other (Comment) 12 Fr.   5 15 10   - See IR note re: drain   Assessment/Plan: - Overnight developed low grade fever and persistent tachycardia. Only change in management is Cefepime to Cefriaxone. Would consider ID consult given negative cultures thus far and complexity of case and likely need for long term ABX.  - Pain control as needed. Consider PO pain medication for longer acting benefit.  - Spoke with IR team - they agree with trying to do another drain. Pt is amenable following out discussion.  - Given the complexity and risk of surgery in this setting, I would advise doing surgery for this only as a last resort. Patient and I have discussed she would be at high risk for bowel injury and may also disseminate the infection. In the event that surgery is required, would need to be coordinated with general surgery (we would coordinate that).  Pt voices understanding.  - Will  continue to follow along daily   LOS: 1 day    , MD 02/10/2022, 11:04 AM

## 2022-02-10 NOTE — Progress Notes (Signed)
  RLQ abscess drain  Drain Location: RLQ Size: Fr size: 12 Fr Date of placement: 02/05/22  Currently to: Drain collection device: suction bulb 24 hour output:  Output by Drain (mL) 02/08/22 0701 - 02/08/22 1900 02/08/22 1901 - 02/09/22 0700 02/09/22 0701 - 02/09/22 1900 02/09/22 1901 - 02/10/22 0700 02/10/22 0701 - 02/10/22 0909  Closed System Drain 1 Right;Inferior;Anterior RLQ Other (Comment) 12 Fr.   5 15     Interval imaging/drain manipulation:  CT 02/08/22: IMPRESSION: Interval placement of percutaneous drainage catheter within air-fluid collection in the right lower abdomen with interval decrease in size. There is 7.2 cm loculated fluid collection in the cul-de-sac in the pelvis with slightly enhancing margins. There is another 4 cm loculated fluid collection in the left side of the pelvis. These may suggest loculated serous fluid collections or abscesses. Small ascites is noted in the perihepatic region.  Current examination: Lungs CTA Abd soft; tender Tmax 100.9 Flushes easily-aspirates definitely difficult Small amount clear yellow fluid in JP 20 cc OP total yesterday Insertion site unremarkable. Suture and stat lock in place. Dressed appropriately.   Gram Stain NO SQUAMOUS EPITHELIAL CELLS SEEN  RARE WBC SEEN  NO ORGANISMS SEEN   Culture NO GROWTH 4 DAYS NO ANAEROBES ISOLATED; CULTURE IN PROGRESS FOR 5 DAYS     Plan: Continue TID flushes with 5 cc NS. Record output Q shift. Dressing changes QD or PRN if soiled.  Call IR APP or on call IR MD if difficulty flushing or sudden change in drain output.  Repeat imaging/possible drain injection once output < 10 mL/QD (excluding flush material.)  Will discuss findings today with Dr Grace Isaac  Discharge planning: Please contact IR APP or on call IR MD prior to patient d/c to ensure appropriate follow up plans are in place. Typically patient will follow up with IR clinic 10-14 days post d/c for repeat imaging/possible drain  injection. IR scheduler will contact patient with date/time of appointment. Patient will need to flush drain QD with 5 cc NS, record output QD, dressing changes every 2-3 days or earlier if soiled.   IR will continue to follow - please call with questions or concerns.

## 2022-02-11 LAB — CBC WITH DIFFERENTIAL/PLATELET
Abs Immature Granulocytes: 0.1 10*3/uL — ABNORMAL HIGH (ref 0.00–0.07)
Basophils Absolute: 0 10*3/uL (ref 0.0–0.1)
Basophils Relative: 0 %
Eosinophils Absolute: 0 10*3/uL (ref 0.0–0.5)
Eosinophils Relative: 0 %
HCT: 26.2 % — ABNORMAL LOW (ref 36.0–46.0)
Hemoglobin: 8.3 g/dL — ABNORMAL LOW (ref 12.0–15.0)
Lymphocytes Relative: 8 %
Lymphs Abs: 1.1 10*3/uL (ref 0.7–4.0)
MCH: 28.1 pg (ref 26.0–34.0)
MCHC: 31.7 g/dL (ref 30.0–36.0)
MCV: 88.8 fL (ref 80.0–100.0)
Monocytes Absolute: 0.7 10*3/uL (ref 0.1–1.0)
Monocytes Relative: 5 %
Myelocytes: 1 %
Neutro Abs: 11.6 10*3/uL — ABNORMAL HIGH (ref 1.7–7.7)
Neutrophils Relative %: 86 %
Platelets: 545 10*3/uL — ABNORMAL HIGH (ref 150–400)
RBC: 2.95 MIL/uL — ABNORMAL LOW (ref 3.87–5.11)
RDW: 16 % — ABNORMAL HIGH (ref 11.5–15.5)
WBC: 13.5 10*3/uL — ABNORMAL HIGH (ref 4.0–10.5)
nRBC: 0 % (ref 0.0–0.2)
nRBC: 1 /100 WBC — ABNORMAL HIGH

## 2022-02-11 LAB — BASIC METABOLIC PANEL
Anion gap: 6 (ref 5–15)
BUN: 5 mg/dL — ABNORMAL LOW (ref 6–20)
CO2: 28 mmol/L (ref 22–32)
Calcium: 7.9 mg/dL — ABNORMAL LOW (ref 8.9–10.3)
Chloride: 105 mmol/L (ref 98–111)
Creatinine, Ser: 0.68 mg/dL (ref 0.44–1.00)
GFR, Estimated: >60 mL/min (ref >60)
Glucose, Bld: 86 mg/dL (ref 70–99)
Potassium: 3.6 mmol/L (ref 3.5–5.1)
Sodium: 139 mmol/L (ref 135–145)

## 2022-02-11 MED ORDER — METRONIDAZOLE 500 MG PO TABS
500.0000 mg | ORAL_TABLET | Freq: Two times a day (BID) | ORAL | Status: DC
Start: 2022-02-11 — End: 2022-02-21
  Administered 2022-02-11 – 2022-02-21 (×21): 500 mg via ORAL
  Filled 2022-02-11 (×21): qty 1

## 2022-02-11 MED ORDER — SODIUM CHLORIDE 0.9% FLUSH
10.0000 mL | INTRAVENOUS | Status: DC | PRN
  Administered 2022-02-11: 10 mL
  Administered 2022-02-18: 20 mL

## 2022-02-11 MED ORDER — KETOROLAC TROMETHAMINE 10 MG PO TABS
10.0000 mg | ORAL_TABLET | Freq: Four times a day (QID) | ORAL | Status: AC | PRN
  Administered 2022-02-13 – 2022-02-16 (×2): 10 mg via ORAL
  Filled 2022-02-11 (×4): qty 1

## 2022-02-11 MED ORDER — OXYCODONE HCL 5 MG PO TABS
5.0000 mg | ORAL_TABLET | Freq: Three times a day (TID) | ORAL | Status: DC
  Administered 2022-02-11 – 2022-02-14 (×7): 5 mg via ORAL
  Filled 2022-02-11 (×7): qty 1

## 2022-02-11 MED ORDER — DOXYCYCLINE HYCLATE 100 MG PO TABS
100.0000 mg | ORAL_TABLET | Freq: Once | ORAL | Status: AC
  Administered 2022-02-11: 100 mg via ORAL
  Filled 2022-02-11: qty 1

## 2022-02-11 MED ORDER — SODIUM CHLORIDE 0.9% FLUSH
5.0000 mL | Freq: Three times a day (TID) | INTRAVENOUS | Status: DC
  Administered 2022-02-11 – 2022-02-18 (×13): 5 mL

## 2022-02-11 NOTE — Progress Notes (Cosign Needed)
Supervising Physician: Richarda Overlie  Patient Status:  [facility] - In-pt  Chief Complaint:  Tubo-ovarian abscess, status post drain placement x2  Subjective:  Patient in much pain today. Had just recently soiled the bed and expresses embarrassment  Allergies: Fish allergy, Motrin [ibuprofen], Mushroom extract complex, Shellfish-derived products, and Tylenol [acetaminophen]  Medications: Prior to Admission medications   Medication Sig Start Date End Date Taking? Authorizing Provider  doxycycline (VIBRAMYCIN) 100 MG capsule Take 1 capsule (100 mg total) by mouth 2 (two) times daily. Patient not taking: Reported on 02/04/2022 08/04/20   Liberty Handy, PA-C  hydrOXYzine (ATARAX/VISTARIL) 25 MG tablet Take 1 tablet (25 mg total) by mouth every 6 (six) hours. Patient not taking: Reported on 02/04/2022 08/04/20   Liberty Handy, PA-C  methylPREDNISolone (MEDROL DOSEPAK) 4 MG TBPK tablet Follow package insert Patient not taking: Reported on 02/04/2022 08/10/21   Tanda Rockers, PA-C  penicillin v potassium (VEETID) 500 MG tablet Take 1 tablet (500 mg total) by mouth 3 (three) times daily. Patient not taking: Reported on 02/04/2022 05/24/19   Mardella Layman, MD  permethrin (ELIMITE) 5 % cream Apply to affected area once Patient not taking: Reported on 02/04/2022 08/04/20   Liberty Handy, PA-C  traMADol (ULTRAM) 50 MG tablet Take 1 tablet (50 mg total) by mouth every 6 (six) hours as needed. Patient not taking: Reported on 02/04/2022 05/25/19   Eustace Moore, MD     Vital Signs: BP 121/62 (BP Location: Right Arm)   Pulse (!) 108   Temp 98 F (36.7 C) (Oral)   Resp 16   Ht 5\' 3"  (1.6 m)   Wt 169 lb 8.5 oz (76.9 kg)   SpO2 92%   BMI 30.03 kg/m   Physical Exam Constitutional:      Appearance: She is not toxic-appearing.  HENT:     Head: Normocephalic and atraumatic.  Eyes:     Extraocular Movements: Extraocular movements intact.  Cardiovascular:     Rate and Rhythm:  Tachycardia present.  Pulmonary:     Effort: Pulmonary effort is normal.  Abdominal:     Tenderness: There is abdominal tenderness.  Neurological:     General: No focal deficit present.     Mental Status: She is alert.   Drain Location: RLQ, R TG Size: Fr size: 12 Fr, 10 Fr Date of placement: 6/16 (exchanged 6/21), 6/23 Currently to: Drain collection device: gravity 24 hour output:  Output by Drain (mL) 02/09/22 0700 - 02/09/22 1459 02/09/22 1500 - 02/09/22 2259 02/09/22 2300 - 02/10/22 0659 02/10/22 0700 - 02/10/22 1459 02/10/22 1500 - 02/10/22 2259 02/10/22 2300 - 02/11/22 0659 02/11/22 0700 - 02/11/22 1349  Closed System Drain 1 Right Buttock Other (Comment) 10 Fr.    45  35   RLQ Drain 12Fr: 0  Current examination: RLQ Insertion site unremarkable.  Unable to visualize R TG per patient comfort Suture and stat lock in place for RLQ drain. Dressed appropriately.  Minimal serous OP present in both gravity bags   Imaging: CT IMAGE GUIDED DRAINAGE BY PERCUTANEOUS CATHETER  Result Date: 02/10/2022 INDICATION: History of tubo-ovarian abscess, post CT-guided placement right lower abdominal/pelvic drainage catheter on 02/05/2022. Postprocedural CT scan performed 02/08/2022 demonstrates an unchanged fluid collection within the right lower pelvis and as such request made for placement of an additional percutaneous drainage catheter for infection source control purposes. Additionally, there is request for CT-guided exchange and/or repositioning of the pre-existing drainage catheter into the more dominant component  of the residual pelvic abscess EXAM: 1. CT GUIDED RIGHT TRANS GLUTEAL APPROACH TUBO-OVARIAN ABSCESS DRAINAGE CATHETER PLACEMENT 2. CT-GUIDED EXCHANGE AND REPOSITIONING OF EXISTING RIGHT LOWER ABDOMINAL/PELVIC DRAINAGE CATHETER COMPARISON:  CT abdomen pelvis-02/08/2022; 02/04/2022 CT guided right lower abdominal/pelvic abscess drainage catheter placement-02/05/2022 MEDICATIONS: The  patient is currently admitted to the hospital and receiving intravenous antibiotics. The antibiotics were administered within an appropriate time frame prior to the initiation of the procedure. ANESTHESIA/SEDATION: Moderate (conscious) sedation was employed during this procedure as administered by the Interventional Radiology RN. A total of Versed 4 mg, Dilaudid 1 mg and Fentanyl 150 mcg was administered intravenously. Moderate Sedation Time: 60 minutes. The patient's level of consciousness and vital signs were monitored continuously by radiology nursing throughout the procedure under my direct supervision. CONTRAST:  None COMPLICATIONS: None immediate. PROCEDURE: RADIATION DOSE REDUCTION: This exam was performed according to the departmental dose-optimization program which includes automated exposure control, adjustment of the mA and/or kV according to patient size and/or use of iterative reconstruction technique. Informed written consent was obtained from the patient after a discussion of the risks, benefits and alternatives to treatment. The patient was initially placed prone on the CT gantry and a pre procedural CT was performed re-demonstrating the known abscess/fluid collection within the right hemipelvis with dominant component measuring approximately 5.5 x 5.4 cm (image 11, series 2). The procedure was planned. A timeout was performed prior to the initiation of the procedure. The skin overlying the right buttocks was prepped and draped in the usual sterile fashion. The overlying soft tissues were anesthetized with 1% lidocaine with epinephrine. Appropriate trajectory was planned with the use of a 22 gauge spinal needle. An 18 gauge trocar needle was advanced into the abscess/fluid collection and a short Amplatz super stiff wire was coiled within the collection. Appropriate positioning was confirmed with a limited CT scan. The tract was serially dilated allowing placement of a 10 [place] all-purpose drainage  catheter. Appropriate positioning was confirmed with a limited postprocedural CT scan. Approximately 65 ml of purulent appearing serous fluid was aspirated. The tube was connected to a drainage bag and secured in place within interrupted suture and a Stat Lock device. A dressing was applied. The patient was then repositioned supine on the CT gantry and noncontrast images were obtained of the lower pelvis and pre-existing right anterior approach lower abdominal/pelvic drainage catheter. Attempts were made to retract the existing drainage catheter into the dominant component of the residual collection however this proved uncomfortable for the patient. As such, the external portion of the drainage catheter was prepped and draped in the usual sterile fashion. The external portion of the drainage catheter was cut and cannulated with a short Amplatz wire. Existing 12 French drainage catheter was then exchanged for a new 12 French percutaneous catheter which under intermittent CT guidance was repositioned into the dominant residual component of the right lower quadrant collection (series 11). The drainage catheter was connected to a gravity bag and secured in place within interrupted suture and a Stat Lock device. Patient tolerated the above procedures well (though did require a generous amount of conscious sedation medications) without immediate postprocedural complication. IMPRESSION: 1. Successful CT guided placement of a 10 [place] all purpose drain catheter into the undrained pelvic abscess via right trans gluteal approach with aspiration of 65 mL of purulent fluid. Samples were sent to the laboratory as requested by the ordering clinical team. 2. Successful CT guided exchange and repositioning of pre-existing 12 French right anterior abdominal/pelvic drainage catheter  with end now coiled and locked within the residual anteriorly located abscess cavity. Electronically Signed   By: Simonne Come M.D.   On: 02/10/2022  16:33   CT ABSCESS CATH EXCHANGE  Result Date: 02/10/2022 INDICATION: History of tubo-ovarian abscess, post CT-guided placement right lower abdominal/pelvic drainage catheter on 02/05/2022. Postprocedural CT scan performed 02/08/2022 demonstrates an unchanged fluid collection within the right lower pelvis and as such request made for placement of an additional percutaneous drainage catheter for infection source control purposes. Additionally, there is request for CT-guided exchange and/or repositioning of the pre-existing drainage catheter into the more dominant component of the residual pelvic abscess EXAM: 1. CT GUIDED RIGHT TRANS GLUTEAL APPROACH TUBO-OVARIAN ABSCESS DRAINAGE CATHETER PLACEMENT 2. CT-GUIDED EXCHANGE AND REPOSITIONING OF EXISTING RIGHT LOWER ABDOMINAL/PELVIC DRAINAGE CATHETER COMPARISON:  CT abdomen pelvis-02/08/2022; 02/04/2022 CT guided right lower abdominal/pelvic abscess drainage catheter placement-02/05/2022 MEDICATIONS: The patient is currently admitted to the hospital and receiving intravenous antibiotics. The antibiotics were administered within an appropriate time frame prior to the initiation of the procedure. ANESTHESIA/SEDATION: Moderate (conscious) sedation was employed during this procedure as administered by the Interventional Radiology RN. A total of Versed 4 mg, Dilaudid 1 mg and Fentanyl 150 mcg was administered intravenously. Moderate Sedation Time: 60 minutes. The patient's level of consciousness and vital signs were monitored continuously by radiology nursing throughout the procedure under my direct supervision. CONTRAST:  None COMPLICATIONS: None immediate. PROCEDURE: RADIATION DOSE REDUCTION: This exam was performed according to the departmental dose-optimization program which includes automated exposure control, adjustment of the mA and/or kV according to patient size and/or use of iterative reconstruction technique. Informed written consent was obtained from the  patient after a discussion of the risks, benefits and alternatives to treatment. The patient was initially placed prone on the CT gantry and a pre procedural CT was performed re-demonstrating the known abscess/fluid collection within the right hemipelvis with dominant component measuring approximately 5.5 x 5.4 cm (image 11, series 2). The procedure was planned. A timeout was performed prior to the initiation of the procedure. The skin overlying the right buttocks was prepped and draped in the usual sterile fashion. The overlying soft tissues were anesthetized with 1% lidocaine with epinephrine. Appropriate trajectory was planned with the use of a 22 gauge spinal needle. An 18 gauge trocar needle was advanced into the abscess/fluid collection and a short Amplatz super stiff wire was coiled within the collection. Appropriate positioning was confirmed with a limited CT scan. The tract was serially dilated allowing placement of a 10 [place] all-purpose drainage catheter. Appropriate positioning was confirmed with a limited postprocedural CT scan. Approximately 65 ml of purulent appearing serous fluid was aspirated. The tube was connected to a drainage bag and secured in place within interrupted suture and a Stat Lock device. A dressing was applied. The patient was then repositioned supine on the CT gantry and noncontrast images were obtained of the lower pelvis and pre-existing right anterior approach lower abdominal/pelvic drainage catheter. Attempts were made to retract the existing drainage catheter into the dominant component of the residual collection however this proved uncomfortable for the patient. As such, the external portion of the drainage catheter was prepped and draped in the usual sterile fashion. The external portion of the drainage catheter was cut and cannulated with a short Amplatz wire. Existing 12 French drainage catheter was then exchanged for a new 12 French percutaneous catheter which under  intermittent CT guidance was repositioned into the dominant residual component of the right lower quadrant  collection (series 11). The drainage catheter was connected to a gravity bag and secured in place within interrupted suture and a Stat Lock device. Patient tolerated the above procedures well (though did require a generous amount of conscious sedation medications) without immediate postprocedural complication. IMPRESSION: 1. Successful CT guided placement of a 10 [place] all purpose drain catheter into the undrained pelvic abscess via right trans gluteal approach with aspiration of 65 mL of purulent fluid. Samples were sent to the laboratory as requested by the ordering clinical team. 2. Successful CT guided exchange and repositioning of pre-existing 12 French right anterior abdominal/pelvic drainage catheter with end now coiled and locked within the residual anteriorly located abscess cavity. Electronically Signed   By: Simonne Come M.D.   On: 02/10/2022 16:33   CT ABDOMEN PELVIS W CONTRAST  Result Date: 02/08/2022 CLINICAL DATA:  Sepsis EXAM: CT ABDOMEN AND PELVIS WITH CONTRAST TECHNIQUE: Multidetector CT imaging of the abdomen and pelvis was performed using the standard protocol following bolus administration of intravenous contrast. RADIATION DOSE REDUCTION: This exam was performed according to the departmental dose-optimization program which includes automated exposure control, adjustment of the mA and/or kV according to patient size and/or use of iterative reconstruction technique. CONTRAST:  OMNIPAQUE IOHEXOL 300 MG/ML  SOLN COMPARISON:  Previous studies including the CT done on 02-19-2022 FINDINGS: Lower chest: Small bilateral pleural effusions are seen more so on the right side. There are linear patchy infiltrates in the lingula and both lower lobes suggesting atelectasis/pneumonia. Hepatobiliary: No focal abnormality is seen in the liver. There is no dilation of bile ducts. Gallbladder is  unremarkable. Small ascites is noted in the perihepatic region. Pancreas: No focal abnormality is seen. Spleen: Unremarkable. Adrenals/Urinary Tract: Adrenals are not enlarged. There is no hydronephrosis. There are no renal or ureteral stones. Urinary bladder is unremarkable. There is 3 mm low-density in the midportion of left kidney which is too small to be characterized. Stomach/Bowel: Stomach is not distended. Small bowel loops are not dilated. Appendix is not seen. There is no significant wall thickening in the colon. There is interval placement of percutaneous drainage catheter with its tip in the right lower abdomen. There is interval decrease in size of the complex fluid collection in the right lower quadrant. There is 4 cm loculated fluid collection slightly to the left of midline at the level of pelvic inlet. There is 7.2 x 5.3 x 4.9 cm loculated fluid collection with minimally enhancing margin in the cul-de-sac in the pelvis. There are no pockets of air within these loculated fluid collections. There is stranding in the fat planes in the lower abdomen and pelvis. Vascular/Lymphatic: Vascular structures are unremarkable. Reproductive: Uterus is to the right of midline. Other: There is no pneumoperitoneum. Musculoskeletal: No acute findings are seen. IMPRESSION: Interval placement of percutaneous drainage catheter within air-fluid collection in the right lower abdomen with interval decrease in size. There is 7.2 cm loculated fluid collection in the cul-de-sac in the pelvis with slightly enhancing margins. There is another 4 cm loculated fluid collection in the left side of the pelvis. These may suggest loculated serous fluid collections or abscesses. Small ascites is noted in the perihepatic region. There is no evidence of intestinal obstruction or pneumoperitoneum. There is no hydronephrosis. Small bilateral pleural effusions. There are linear patchy infiltrates in both lower lung fields suggesting  atelectasis/pneumonia. Electronically Signed   By: Ernie Avena M.D.   On: 02/08/2022 09:41    Labs:  CBC: Recent Labs  02/08/22 0106 02/09/22 0308 02/10/22 0440 02/11/22 0057  WBC 16.9* 13.7* 15.0* 13.5*  HGB 10.1* 8.9* 9.2* 8.3*  HCT 30.7* 26.5* 27.4* 26.2*  PLT 518* 460* 546* 545*    COAGS: Recent Labs    02/04/22 0901  INR 1.2  APTT 25    BMP: Recent Labs    02/08/22 0106 02/09/22 0308 02/10/22 0440 02/11/22 0057  NA 135 136 137 139  K 3.3* 2.9* 3.0* 3.6  CL 93* 101 103 105  CO2 28 27 25 28   GLUCOSE 103* 111* 119* 86  BUN 6 9 6  5*  CALCIUM 8.6* 8.0* 7.9* 7.9*  CREATININE 0.71 0.61 0.74 0.68  GFRNONAA >60 >60 >60 >60    LIVER FUNCTION TESTS: Recent Labs    02/04/22 0901 02/05/22 0347 02/08/22 0106 02/09/22 0308  BILITOT 1.8* 1.2 0.7 0.4  AST 60* 28 32 26  ALT 78* 48* 30 22  ALKPHOS 90 74 71 48  PROT 7.8 6.6 7.1 5.7*  ALBUMIN 2.7* 2.0* 2.0* 1.6*    Assessment and Plan:  Tubo-ovarian abscess --s/p drains x 2, placement of TG drain 02/10/22 and exchange/reposition of RLQ drain 02/10/22 --reports more pain today -- WBC 13.5 (down from 15.0), afebile, mildly tachcardic --discussed adding 1st drain back to flowsheets with RN --discussed TID flushing with RN.  Plan: Continue TID flushes with 5 cc NS. Record output Q shift. Dressing changes QD or PRN if soiled.   Call IR APP or on call IR MD if difficulty flushing or sudden change in drain output.  Repeat imaging/possible drain injection once output < 10 mL/QD (excluding flush material.)  Discharge planning: Please contact IR APP or on call IR MD prior to patient d/c to ensure appropriate follow up plans are in place. Typically patient will follow up with IR clinic 10-14 days post d/c for repeat imaging/possible drain injection. IR scheduler will contact patient with date/time of appointment. Patient will need to flush drain QD with 5 cc NS, record output QD, dressing changes every 2-3  days or earlier if soiled.   IR will continue to follow - please call with questions or concerns.  Electronically Signed: Sheliah PlaneHayley Naje Rice, PA 02/11/2022, 1:45 PM   I spent a total of 15 Minutes at the the patient's bedside AND on the patient's hospital floor or unit, greater than 50% of which was counseling/coordinating care [facility]

## 2022-02-11 NOTE — Progress Notes (Signed)
Subjective: Patient reports normal bowel and bladder function.  She still reports pain and doesn't feel like it is controlled. She feels about the same for yesterday. Feels hot.   Objective: Today's Vitals   02/11/22 0727 02/11/22 0740 02/11/22 0800 02/11/22 1132  BP:   121/62 121/62  Pulse:   100 (!) 108  Resp:      Temp:   98 F (36.7 C)   TempSrc:   Oral   SpO2:   96% 92%  Weight:      Height:      PainSc: Asleep 0-No pain     Body mass index is 30.03 kg/m.   General: alert, cooperative, and no distress Resp: clear to auscultation bilaterally Cardio: regular rate and rhythm GI: normal findings: bowel sounds normal and abnormal findings:  diffusely tender in the entire abdomen, + rebound Extremities: no edema, redness or tenderness in the calves or thighs and SCDs on  Output by Drain (mL) 02/09/22 0701 - 02/09/22 1900 02/09/22 1901 - 02/10/22 0700 02/10/22 0701 - 02/10/22 1900 02/10/22 1901 - 02/11/22 0700 02/11/22 0701 - 02/11/22 1149  Closed System Drain 1 Right Buttock Other (Comment) 10 Fr.   45 35      Assessment/Plan: - Would consider ID consult given negative cultures thus far and complexity of case and likely need for long term ABX.  - Pain control as needed. Would consider oxycodone 5-10 mg Q4h for pain. Would add in Toradol for pain for about 24-48h and then transition to ibuprofen or naproxen.  - IR following. Second drain with continued output of purulent material. 65cc drained initially.  - Given the complexity and risk of surgery in this setting, I would advise doing surgery for this only as a last resort. Patient and I have discussed she would be at high risk for bowel injury and may also disseminate the infection. In the event that surgery is required, would need to be coordinated with general surgery (we would coordinate that).  Pt voices understanding.  - Ultimately discharge would be pending control of pain, afebrile for 24-48h and plan for outpatient  antibiotics, and follow up with IR re: drains. We would coordinate follow up with our office as well which is done easily.  - Will continue to follow along daily   LOS: 2 days    Milas Hock, MD 02/11/2022, 11:49 AM

## 2022-02-11 NOTE — Progress Notes (Addendum)
Subjective: Patient seen and evaluated at bedside.  Still endorsing abdominal pain and labial swelling.  She reports minimal but noticeable improvement in her abdominal pain.  No nausea or vomiting.  Urinating and bowel movements normal.   Objective:  Vital signs in last 24 hours: Vitals:   02/11/22 0348 02/11/22 0400 02/11/22 0800 02/11/22 1132  BP:  (!) 116/44 121/62 121/62  Pulse: (!) 101 (!) 106 100 (!) 108  Resp:  16    Temp:  98 F (36.7 C) 98 F (36.7 C)   TempSrc:  Oral Oral   SpO2: 97% 91% 96% 92%  Weight:      Height:       Physical Exam Constitutional:      General: She is awake.     Appearance: She is ill-appearing.  HENT:     Head: Normocephalic and atraumatic.  Cardiovascular:     Rate and Rhythm: Tachycardia present.     Heart sounds: Normal heart sounds.  Pulmonary:     Effort: Pulmonary effort is normal.     Breath sounds: Normal air entry.  Abdominal:     General: Bowel sounds are normal.     Palpations: Abdomen is soft.     Tenderness: There is abdominal tenderness.       Comments: JP drain present (red lines). Clean and dry  Genitourinary:    Labia:        Right: Tenderness present.        Left: Tenderness present.      Comments: Swelling of labia Skin:    General: Skin is warm and dry.  Neurological:     General: No focal deficit present.     Mental Status: She is alert.  Psychiatric:        Cognition and Memory: Cognition normal.      Assessment/Plan:  Principal Problem:   Pelvic abscess  Active Problems:   Sepsis (HCC)   Left lower quadrant abdominal pain  Ms. Dukett is a 31 year old female with a past medical history of recent admission for tubo-ovarian abscess complicated by septic shock (02/04/22 - 02/07/22) with previous history of tubo-ovarian abscess 2/2 N. Gonorrhea in 2019 who presents to the ED with c/o nausea, vomiting and abdominal pain, currently admitted for multiple pelvic abscesses.    Multiple pelvic  abscesses Overnight patient continues to spike fever and tachycardic.  There was slight improvement in leukocyte count.  A second drain was placed yesterday by IR with 65 cc output and cultures obtained. On exam, 2 JP drains noted; one in the right lower quadrant and one in the right flank.  Appears clean and dry, no leakage noted.  Patient still appears ill, however, with a second drain, hopefully for some improvement.  If patient is able to tolerate p.o., consider switching to oral Flagyl and doxycycline.  Will adjust pain regimen, as patient still endorsed uncontrolled pain management on current regimen.  Continue current antibiotic regimen at this time. Although surgery is last resort due to high risk of complications. If no improvement, consider surgery.  - IV ceftriaxone, Flagyl, doxycycline - Pain control with Dilaudid and oxycodone - IV LR 1 L fluid bolus - Reassess for improvement over the next 24 hours - Surgery following, recommendations appreciated - OB/GYN following, recommendations appreciated  Iron deficiency anemia We will hold off on iron supplementation in the setting of acute infection. - Transfuse if hemoglobin is less than 7 -- Monitor for active signs of bleeding   Prior to  Admission Living Arrangement: Anticipated Discharge Location: Barriers to Discharge: Dispo: Anticipated discharge in approximately 1-3 day(s).   Dellis Filbert, MD 02/11/2022, 12:18 PM Pager: [phone] After 5pm on weekdays and 1pm on weekends: On Call pager [phone]    Internal Medicine Attending:   I saw and examined the patient. I reviewed the resident's note and I agree with the resident's findings and plan as documented in the resident's note.   Patient has received IV Toradol since admission and thinks she can take oral meds today - will add oral NSAID to her pain regimen.  If she's not clinically improving, will consult ID as next step in treatment plan.

## 2022-02-11 NOTE — Progress Notes (Signed)
[place], DO at bedside. Pt stated she can not feel calf muscle but can feel some sensation in shin and upper thigh and foot. She stated it started two hours ago and it feels like her calf  did when she was in the ICU.

## 2022-02-11 NOTE — Plan of Care (Signed)

## 2022-02-11 NOTE — Progress Notes (Deleted)
   02/11/22 0008  Assess: MEWS Score  Temp (!) 100.6 F (38.1 C)  BP 120/79  MAP (mmHg) 91  Pulse Rate (!) 107  ECG Heart Rate (!) 105  Resp 18  Level of Consciousness Alert  SpO2 96 %  O2 Device Nasal Cannula  O2 Flow Rate (L/min) 2 L/min  Assess: MEWS Score  MEWS Temp 1  MEWS Systolic 0  MEWS Pulse 1  MEWS RR 0  MEWS LOC 0  MEWS Score 2  MEWS Score Color Yellow  Assess: if the MEWS score is Yellow or Red  Were vital signs taken at a resting state? Yes  Focused Assessment No change from prior assessment  Does the patient meet 2 or more of the SIRS criteria? No  Does the patient have a confirmed or suspected source of infection? Yes  Provider and Rapid Response Notified? Yes  MEWS guidelines implemented *See Row Information* Yes  Treat  Pain Scale 0-10  Pain Score 7  Pain Type Acute pain  Pain Location Coccyx  Pain Intervention(s) Medication (See eMAR)  Take Vital Signs  Increase Vital Sign Frequency  Yellow: Q 2hr X 2 then Q 4hr X 2, if remains yellow, continue Q 4hrs  Escalate  MEWS: Escalate Yellow: discuss with charge nurse/RN and consider discussing with provider and RRT  Notify: Charge Nurse/RN  Name of Charge Nurse/RN Notified April, RN  Date Charge Nurse/RN Notified 02/11/22  Time Charge Nurse/RN Notified 0110  Notify: Provider  Provider Name/Title Ned Card, DO  Date Provider Notified 02/11/22  Time Provider Notified 0119  Method of Notification Page  Notification Reason Other (Comment) (yellow mews HR 107)  Provider response Other (Comment) (continue to monitor)  Date of Provider Response 02/11/22  Time of Provider Response 0118  Assess: SIRS CRITERIA  SIRS Temperature  0  SIRS Pulse 1  SIRS Respirations  0  SIRS WBC 0  SIRS Score Sum  1

## 2022-02-11 NOTE — Progress Notes (Signed)
Clinical update:  Paged by RN that patient cannot feel her right leg below her knee. The patient reports that this has been ongoing for the last two hours. Dr. Elaina Pattee and I evaluated the patient at the bedside and she states that she intermittently experienced numbness/tingling in her right leg during her ICU admission and was told she experiences this due to poor circulation. I am unable to find any imaging of her RLE that points to the patient having poor circulation. Specifically, the patient states that she cannot feel her right calf muscle and her right lower leg, although she can feel everything above her right knee and in her right foot/ankle.  Physical exam: Neuro:  CN II-XII intact.  Motor: 5/5 strength RUE and LUE. 5/5 strength LLE. 3/5 strength RLE.  Sensation: Normal sensation in RUE, LUE, and LLE. Patient reports normal sensation above right knee and intermittently below right knee, although exam is inconsistent with repeated testing. Normal sensation on dorsum of right foot.  Normal heel to shin testing.  MSK: 2+ DP pulses bilaterally. Normal bulk and tone. Negative Homan's sign.    A/P: The distribution of the patient's reported diminished sensation is not consistent with any dermatome. Her numbness/tingling could be secondary to her known diabetic neuropathy, however, she has also not had any form of DVT ppx until this morning (received lovenox for the first time). Will obtain U/S to rule out DVT.    Paula Rafter, DO Internal Medicine Resident, PGY-1 On-call pager: [phone]

## 2022-02-11 NOTE — Progress Notes (Signed)
   02/11/22 0008  Assess: MEWS Score  Temp (!) 100.6 F (38.1 C)  BP 120/79  MAP (mmHg) 91  Pulse Rate (!) 107  ECG Heart Rate (!) 105  Resp 18  Level of Consciousness Alert  SpO2 96 %  O2 Device Nasal Cannula  O2 Flow Rate (L/min) 2 L/min  Assess: MEWS Score  MEWS Temp 1  MEWS Systolic 0  MEWS Pulse 1  MEWS RR 0  MEWS LOC 0  MEWS Score 2  MEWS Score Color Yellow  Assess: if the MEWS score is Yellow or Red  Were vital signs taken at a resting state? Yes  Focused Assessment No change from prior assessment  Does the patient meet 2 or more of the SIRS criteria? No  Does the patient have a confirmed or suspected source of infection? Yes  Provider and Rapid Response Notified? Yes  MEWS guidelines implemented *See Row Information* Yes  Treat  MEWS Interventions Administered prn meds/treatments  Pain Scale 0-10  Pain Score 7  Pain Type Acute pain  Pain Location Coccyx  Pain Intervention(s) Medication (See eMAR)  Take Vital Signs  Increase Vital Sign Frequency  Yellow: Q 2hr X 2 then Q 4hr X 2, if remains yellow, continue Q 4hrs  Escalate  MEWS: Escalate Yellow: discuss with charge nurse/RN and consider discussing with provider and RRT  Notify: Charge Nurse/RN  Name of Charge Nurse/RN Notified April, RN  Date Charge Nurse/RN Notified 02/11/22  Time Charge Nurse/RN Notified 0110  Notify: Provider  Provider Name/Title Ned Card, DO  Date Provider Notified 02/11/22  Time Provider Notified 0119  Method of Notification Page  Notification Reason Other (Comment) (yellow mews HR 107/ Temp 100.6)  Provider response Other (Comment) (continue to monitor)  Date of Provider Response 02/11/22  Time of Provider Response 0118  Assess: SIRS CRITERIA  SIRS Temperature  0  SIRS Pulse 1  SIRS Respirations  0  SIRS WBC 0  SIRS Score Sum  1

## 2022-02-12 ENCOUNTER — Inpatient Hospital Stay (HOSPITAL_COMMUNITY): Payer: Medicaid Other

## 2022-02-12 DIAGNOSIS — R7881 Bacteremia: Secondary | ICD-10-CM

## 2022-02-12 DIAGNOSIS — Z8719 Personal history of other diseases of the digestive system: Secondary | ICD-10-CM

## 2022-02-12 DIAGNOSIS — M79604 Pain in right leg: Secondary | ICD-10-CM | POA: Diagnosis not present

## 2022-02-12 DIAGNOSIS — K651 Peritoneal abscess: Secondary | ICD-10-CM

## 2022-02-12 DIAGNOSIS — Z22322 Carrier or suspected carrier of Methicillin resistant Staphylococcus aureus: Secondary | ICD-10-CM

## 2022-02-12 DIAGNOSIS — B9562 Methicillin resistant Staphylococcus aureus infection as the cause of diseases classified elsewhere: Secondary | ICD-10-CM

## 2022-02-12 LAB — CBC WITH DIFFERENTIAL/PLATELET
Abs Immature Granulocytes: 0 10*3/uL (ref 0.00–0.07)
Basophils Absolute: 0 10*3/uL (ref 0.0–0.1)
Basophils Relative: 0 %
Eosinophils Absolute: 0 10*3/uL (ref 0.0–0.5)
Eosinophils Relative: 0 %
HCT: 27.7 % — ABNORMAL LOW (ref 36.0–46.0)
Hemoglobin: 9.1 g/dL — ABNORMAL LOW (ref 12.0–15.0)
Lymphocytes Relative: 18 %
Lymphs Abs: 2.8 10*3/uL (ref 0.7–4.0)
MCH: 28.8 pg (ref 26.0–34.0)
MCHC: 32.9 g/dL (ref 30.0–36.0)
MCV: 87.7 fL (ref 80.0–100.0)
Monocytes Absolute: 0.6 10*3/uL (ref 0.1–1.0)
Monocytes Relative: 4 %
Neutro Abs: 11.9 10*3/uL — ABNORMAL HIGH (ref 1.7–7.7)
Neutrophils Relative %: 78 %
Platelets: 562 10*3/uL — ABNORMAL HIGH (ref 150–400)
RBC: 3.16 MIL/uL — ABNORMAL LOW (ref 3.87–5.11)
RDW: 16.3 % — ABNORMAL HIGH (ref 11.5–15.5)
WBC: 15.3 10*3/uL — ABNORMAL HIGH (ref 4.0–10.5)
nRBC: 0 % (ref 0.0–0.2)
nRBC: 0 /100 WBC

## 2022-02-12 LAB — BASIC METABOLIC PANEL
Anion gap: 9 (ref 5–15)
BUN: 5 mg/dL — ABNORMAL LOW (ref 6–20)
CO2: 26 mmol/L (ref 22–32)
Calcium: 8 mg/dL — ABNORMAL LOW (ref 8.9–10.3)
Chloride: 105 mmol/L (ref 98–111)
Creatinine, Ser: 0.64 mg/dL (ref 0.44–1.00)
GFR, Estimated: >60 mL/min (ref >60)
Glucose, Bld: 91 mg/dL (ref 70–99)
Potassium: 3.7 mmol/L (ref 3.5–5.1)
Sodium: 140 mmol/L (ref 135–145)

## 2022-02-12 LAB — ECHOCARDIOGRAM COMPLETE
AR max vel: 2.24 cm2
AV Area VTI: 2.25 cm2
AV Area mean vel: 2.21 cm2
AV Mean grad: 4 mmHg
AV Peak grad: 7.1 mmHg
Ao pk vel: 1.33 m/s
S' Lateral: 3.4 cm

## 2022-02-12 MED ORDER — DOXYCYCLINE HYCLATE 100 MG PO TABS
100.0000 mg | ORAL_TABLET | Freq: Every day | ORAL | Status: DC
  Administered 2022-02-12: 100 mg via ORAL
  Filled 2022-02-12: qty 1

## 2022-02-12 MED ORDER — GADOBUTROL 1 MMOL/ML IV SOLN
7.5000 mL | Freq: Once | INTRAVENOUS | Status: AC | PRN
Start: 2022-02-12 — End: 2022-02-12
  Administered 2022-02-12: 7.5 mL via INTRAVENOUS

## 2022-02-12 MED ORDER — LORAZEPAM 2 MG/ML IJ SOLN
INTRAMUSCULAR | Status: AC
  Administered 2022-02-12: 0.5 mg via INTRAVENOUS
  Filled 2022-02-12: qty 1

## 2022-02-12 MED ORDER — DOXYCYCLINE HYCLATE 100 MG PO TABS
100.0000 mg | ORAL_TABLET | Freq: Two times a day (BID) | ORAL | Status: DC
  Administered 2022-02-12 – 2022-02-28 (×31): 100 mg via ORAL
  Filled 2022-02-12 (×33): qty 1

## 2022-02-12 MED ORDER — LORAZEPAM 2 MG/ML IJ SOLN: 0.5000 mg | Freq: Once | INTRAMUSCULAR | Status: AC

## 2022-02-12 NOTE — Progress Notes (Addendum)
2140 Pt went to MRI.  2314 Pt back from MRI.

## 2022-02-13 LAB — CBC WITH DIFFERENTIAL/PLATELET
Abs Immature Granulocytes: 0.13 10*3/uL — ABNORMAL HIGH (ref 0.00–0.07)
Basophils Absolute: 0 10*3/uL (ref 0.0–0.1)
Basophils Relative: 0 %
Eosinophils Absolute: 0.1 10*3/uL (ref 0.0–0.5)
Eosinophils Relative: 1 %
HCT: 26 % — ABNORMAL LOW (ref 36.0–46.0)
Hemoglobin: 8.3 g/dL — ABNORMAL LOW (ref 12.0–15.0)
Immature Granulocytes: 1 %
Lymphocytes Relative: 19 %
Lymphs Abs: 2.6 10*3/uL (ref 0.7–4.0)
MCH: 28.2 pg (ref 26.0–34.0)
MCHC: 31.9 g/dL (ref 30.0–36.0)
MCV: 88.4 fL (ref 80.0–100.0)
Monocytes Absolute: 0.8 10*3/uL (ref 0.1–1.0)
Monocytes Relative: 5 %
Neutro Abs: 10.3 10*3/uL — ABNORMAL HIGH (ref 1.7–7.7)
Neutrophils Relative %: 74 %
Platelets: 550 10*3/uL — ABNORMAL HIGH (ref 150–400)
RBC: 2.94 MIL/uL — ABNORMAL LOW (ref 3.87–5.11)
RDW: 16.3 % — ABNORMAL HIGH (ref 11.5–15.5)
WBC: 14 10*3/uL — ABNORMAL HIGH (ref 4.0–10.5)
nRBC: 0 % (ref 0.0–0.2)

## 2022-02-13 LAB — CULTURE, BLOOD (ROUTINE X 2)
Culture: NO GROWTH
Culture: NO GROWTH
Special Requests: ADEQUATE
Special Requests: ADEQUATE

## 2022-02-13 LAB — BASIC METABOLIC PANEL
Anion gap: 9 (ref 5–15)
BUN: <5 mg/dL — ABNORMAL LOW (ref 6–20)
CO2: 27 mmol/L (ref 22–32)
Calcium: 8 mg/dL — ABNORMAL LOW (ref 8.9–10.3)
Chloride: 102 mmol/L (ref 98–111)
Creatinine, Ser: 0.64 mg/dL (ref 0.44–1.00)
GFR, Estimated: >60 mL/min (ref >60)
Glucose, Bld: 90 mg/dL (ref 70–99)
Potassium: 3.6 mmol/L (ref 3.5–5.1)
Sodium: 138 mmol/L (ref 135–145)

## 2022-02-13 MED ORDER — VANCOMYCIN HCL IN DEXTROSE 1-5 GM/200ML-% IV SOLN
1000.0000 mg | Freq: Two times a day (BID) | INTRAVENOUS | Status: DC
  Administered 2022-02-13 – 2022-02-19 (×12): 1000 mg via INTRAVENOUS
  Filled 2022-02-13 (×17): qty 200

## 2022-02-13 MED ORDER — VANCOMYCIN HCL 1750 MG/350ML IV SOLN
1750.0000 mg | Freq: Once | INTRAVENOUS | Status: AC
  Administered 2022-02-13: 1750 mg via INTRAVENOUS
  Filled 2022-02-13: qty 350

## 2022-02-13 MED ORDER — NICOTINE 21 MG/24HR TD PT24
21.0000 mg | MEDICATED_PATCH | Freq: Every day | TRANSDERMAL | Status: DC
  Administered 2022-02-13 – 2022-02-26 (×13): 21 mg via TRANSDERMAL
  Filled 2022-02-13 (×14): qty 1

## 2022-02-13 NOTE — Progress Notes (Signed)
PT Cancellation Note  Patient Details Name: Paula Lucas MRN: [id number] DOB: 03-13-1991   Cancelled Treatment:    Reason Eval/Treat Not Completed: Pain limiting ability to participate Pt in pain and requesting we come back about 12:15. Will follow up as schedule allows.   Cindee Salt, DPT  Acute Rehabilitation Services  Office: [phone]    Paula Lucas 02/13/2022, 10:22 AM

## 2022-02-13 NOTE — Progress Notes (Signed)
Overnight: Patient underwent TTE and MRI yesterday.  Received Ativan prior to MRI.   Subjective: Patient seen and evaluated at bedside.   -Persistent abd pain. Not been very active 2/2 pain.  Dilaudid does improve pain but does not last long enough.  Aloxi also helps but she reports esophageal discomfort. -poor po intake due to nausea -LE doing well  Objective:  Vital signs in last 24 hours: Vitals:   02/12/22 2026 02/13/22 0042 02/13/22 0418 02/13/22 0747  BP: 123/88 132/89 123/88 128/76  Pulse: (!) 103 (!) 106 87 93  Resp: 20 20 18 16   Temp: 98.6 F (37 C)  98.4 F (36.9 C) 99 F (37.2 C)  TempSrc: Oral  Oral Oral  SpO2: 95% 95% 98% 96%  Weight:      Height:        Physical Exam Constitutional:      General: She is not in acute distress. HENT:     Head: Normocephalic and atraumatic.  Cardiovascular:     Rate and Rhythm: Normal rate.     Heart sounds: Normal heart sounds.  Pulmonary:     Effort: Pulmonary effort is normal.     Breath sounds: Normal breath sounds and air entry.  Abdominal:     General: Bowel sounds are normal.     Palpations: Abdomen is soft.     Comments: Tenderness to palpation  Musculoskeletal:     Right lower leg: No edema.     Left lower leg: No edema.  Skin:    General: Skin is warm and dry.  Neurological:     General: No focal deficit present.     Mental Status: She is alert.     Sensory: Sensation is intact.     Motor: Motor function is intact.  Psychiatric:        Behavior: Behavior normal. Behavior is cooperative.        Cognition and Memory: Cognition normal.     Assessment/Plan:  Principal Problem:   Pelvic abscess  Active Problems:   Sepsis (HCC)   Left lower quadrant abdominal pain   MRSA (methicillin resistant staph aureus) culture positive  Ms. Sweeting is a 31 year old female with a past medical history of recent admission for tubo-ovarian abscess complicated by septic shock (02/04/22 - 02/07/22) with previous history  of tubo-ovarian abscess 2/2 N. Gonorrhea in 2019 who presents to the ED with c/o nausea, vomiting and abdominal pain, currently admitted for multiple pelvic abscesses.   Multiple pelvic abscesses Patient currently hemodynamically stable with heart rate approximately 90 and blood pressure approximately 120/90.  She is satting well on 2 L nasal cannula which is unchanged.  Drains have put out 15 cc and 5 cc yesterday which decreased compared to prior.  No output noted today.  Cultures have remained - for 4 days and wound culture showed no growth for the past 2 days.  OB is following, if no improvement will consult GEN surge for additional management.  ID was consulted due to complicated nature of the case.  She underwent an echo as well as MRI yesterday.  Echo performed due to suspicion for occult bacteremia and MRI performed due to back pain.  Echo and MRI were negative.  Recommended patient continue on oral Doxy 100 mg twice daily and Flagyl 500 mg twice daily.  He is also receiving ceftriaxone 2 g IV daily.  Flagyl and ceftriaxone recommended to be continued for 4 weeks, Doxy continued for 2 weeks.  Patient will need PICC  line for OPAT prior to discharge. -Continue IV ceftriaxone for 4 weeks per ID - Continue oral Flagyl and doxycycline.  Flagyl for 4 weeks, Doxy for 2 weeks. - Pain control with Dilaudid, oxycodone and Toradol --PT/OT consulted.  PT was unable to evaluate the patient due to pain limiting ability to participate. --Lovenox for DVT prophylaxis  - Patient has not shown much clinical improvement.  Discussing with OB about possible surgical intervention.  We will need to consult GEN surge for assistance as well. - IR and OB/GYN following, recommendations appreciated  Iron deficiency anemia Hemoglobin stable at 8.3; no active signs of bleeding.  We will hold off on iron supplementation in the setting of acute infection - Transfuse if hemoglobin is less than 7 - Continue to monitor for  active signs of bleeding    Prior to Admission Living Arrangement: Anticipated Discharge Location: Barriers to Discharge: Dispo: Anticipated discharge in approximately 1-3 day(s).   Adron Bene, MD 02/13/2022, 8:20 AM Pager: [phone] After 5pm on weekdays and 1pm on weekends: On Call pager [phone]

## 2022-02-13 NOTE — Progress Notes (Signed)
ID Brief Note  TTE 02/12/22 1. Left ventricular ejection fraction, by estimation, is 60 to 65%. The  left ventricle has normal function. The left ventricle has no regional  wall motion abnormalities. Left ventricular diastolic parameters were  normal.   2. Right ventricular systolic function is normal. The right ventricular  size is normal.   3. The mitral valve is normal in structure. Trivial mitral valve  regurgitation. No evidence of mitral stenosis.   4. The aortic valve is normal in structure. Aortic valve regurgitation is  not visualized. No aortic stenosis is present.   MRI L spine 02/13/22 IMPRESSION: 1. No MRI evidence for acute infection within the lumbar spine. 2. Left subarticular disc protrusion at L5-S1, contacting the descending left S1 nerve root in the left lateral recess. 3. Small left subarticular disc protrusion at L2-3 with borderline mild left lateral recess stenosis. 4. Multiloculated collections/abscesses within the partially visualized pelvis, better characterized on recent CT.    Odette Fraction, MD Infectious Disease Physician [facility] [address] [place], [place] [address] Phone: [phone]  Fax: [phone]

## 2022-02-14 ENCOUNTER — Inpatient Hospital Stay (HOSPITAL_COMMUNITY): Payer: Medicaid Other

## 2022-02-14 DIAGNOSIS — A419 Sepsis, unspecified organism: Secondary | ICD-10-CM

## 2022-02-14 LAB — CBC WITH DIFFERENTIAL/PLATELET
Abs Immature Granulocytes: 0.06 10*3/uL (ref 0.00–0.07)
Basophils Absolute: 0 10*3/uL (ref 0.0–0.1)
Basophils Relative: 0 %
Eosinophils Absolute: 0.2 10*3/uL (ref 0.0–0.5)
Eosinophils Relative: 2 %
HCT: 26.1 % — ABNORMAL LOW (ref 36.0–46.0)
Hemoglobin: 8.5 g/dL — ABNORMAL LOW (ref 12.0–15.0)
Immature Granulocytes: 1 %
Lymphocytes Relative: 23 %
Lymphs Abs: 2.7 10*3/uL (ref 0.7–4.0)
MCH: 28.6 pg (ref 26.0–34.0)
MCHC: 32.6 g/dL (ref 30.0–36.0)
MCV: 87.9 fL (ref 80.0–100.0)
Monocytes Absolute: 0.7 10*3/uL (ref 0.1–1.0)
Monocytes Relative: 6 %
Neutro Abs: 8.1 10*3/uL — ABNORMAL HIGH (ref 1.7–7.7)
Neutrophils Relative %: 68 %
Platelets: 554 10*3/uL — ABNORMAL HIGH (ref 150–400)
RBC: 2.97 MIL/uL — ABNORMAL LOW (ref 3.87–5.11)
RDW: 16.6 % — ABNORMAL HIGH (ref 11.5–15.5)
WBC: 11.7 10*3/uL — ABNORMAL HIGH (ref 4.0–10.5)
nRBC: 0 % (ref 0.0–0.2)

## 2022-02-14 LAB — BASIC METABOLIC PANEL
Anion gap: 9 (ref 5–15)
BUN: 5 mg/dL — ABNORMAL LOW (ref 6–20)
CO2: 25 mmol/L (ref 22–32)
Calcium: 8 mg/dL — ABNORMAL LOW (ref 8.9–10.3)
Chloride: 103 mmol/L (ref 98–111)
Creatinine, Ser: 0.67 mg/dL (ref 0.44–1.00)
GFR, Estimated: >60 mL/min (ref >60)
Glucose, Bld: 99 mg/dL (ref 70–99)
Potassium: 3.6 mmol/L (ref 3.5–5.1)
Sodium: 137 mmol/L (ref 135–145)

## 2022-02-14 MED ORDER — ENSURE ENLIVE PO LIQD
237.0000 mL | Freq: Two times a day (BID) | ORAL | Status: DC
  Administered 2022-02-22 – 2022-02-25 (×3): 237 mL via ORAL

## 2022-02-14 MED ORDER — IOHEXOL 300 MG/ML  SOLN
100.0000 mL | Freq: Once | INTRAMUSCULAR | Status: AC | PRN
  Administered 2022-02-14: 100 mL via INTRAVENOUS

## 2022-02-14 MED ORDER — TRAZODONE HCL 50 MG PO TABS
50.0000 mg | ORAL_TABLET | Freq: Every day | ORAL | Status: DC
  Administered 2022-02-14 – 2022-02-19 (×6): 50 mg via ORAL
  Filled 2022-02-14 (×6): qty 1

## 2022-02-14 MED ORDER — OXYCODONE HCL 5 MG PO TABS
5.0000 mg | ORAL_TABLET | ORAL | Status: DC | PRN
  Administered 2022-02-14 – 2022-02-17 (×12): 10 mg via ORAL
  Filled 2022-02-14 (×14): qty 2

## 2022-02-14 MED ORDER — POLYVINYL ALCOHOL 1.4 % OP SOLN: 1.0000 [drp] | OPHTHALMIC | Status: DC | PRN

## 2022-02-14 NOTE — Progress Notes (Signed)
Chart check notes little to no output from both drains in last 48 hours. Will order CT abd/pelvis w/IV contrast today and assess for possible drain injection/removal if collections resolved tomorrow (6/26)/  Continue routine drain care for now.  IR will continue to follow.  Lynnette Caffey, PA-C

## 2022-02-14 NOTE — Progress Notes (Signed)
Subjective: Pt reports still in pain. Reviewed with her I adjusted pain medications. She has normal bowel/bladder dysfunction. Reports depressed to me as well (as noted in nursing notes as well).   Objective: Today's Vitals   02/14/22 0257 02/14/22 0309 02/14/22 0715 02/14/22 0809  BP:  123/73  118/78  Pulse:  94  90  Resp:  17  16  Temp:  98.6 F (37 C)  99 F (37.2 C)  TempSrc:  Oral  Oral  SpO2:    98%  Weight:      Height:      PainSc: Asleep  9     Body mass index is 30.03 kg/m.   General: resting with eyes closed, no distress  CV: Regular rate and rhythm Pulm: CTAB, normal effort GI: Diffusely tender to light touch  Output by Drain (mL) 02/12/22 0701 - 02/12/22 1900 02/12/22 1901 - 02/13/22 0700 02/13/22 0701 - 02/13/22 1900 02/13/22 1901 - 02/14/22 0700 02/14/22 0701 - 02/14/22 1034  Closed System Drain 1 Right Buttock Other (Comment) 10 Fr.  15 0  0  Closed System Drain 1 Right RLQ 12 Fr.  5 0 0 0     Assessment/Plan: - Appreciate ID input. She has been afebrile. WBC improved today.  - Pain control as needed. Changed oxycodone 5-10 mg Q4h for pain. Continue NSAIDs as first line.   - IR following. Plan is for repeat CT today to see progress.  - Given the complexity and risk of surgery in this setting, I would advise doing surgery for this only as a last resort. Patient and I have discussed she would be at high risk for bowel injury and may also disseminate the infection. In the event that surgery is required, would need to be coordinated with general surgery   - I have reviewed this patient with multiple members of my group and due to risk of bowel injury we would feel more comfortable with general surgery as primary surgery with [place] assisting if they would like. If surgery is needed, would recommend consult to general surgery but we will continue to follow.  - Ultimately discharge would be pending control of pain, afebrile for 24-48h and plan for outpatient antibiotics,  and follow up with IR re: drains. In hopes to move toward this goal, I adjusted her pain medications.    LOS: 5 days    Milas Hock, MD 02/14/2022, 10:34 AM

## 2022-02-15 ENCOUNTER — Other Ambulatory Visit (HOSPITAL_COMMUNITY): Payer: Self-pay

## 2022-02-15 DIAGNOSIS — Z22322 Carrier or suspected carrier of Methicillin resistant Staphylococcus aureus: Secondary | ICD-10-CM

## 2022-02-15 DIAGNOSIS — M549 Dorsalgia, unspecified: Secondary | ICD-10-CM

## 2022-02-15 DIAGNOSIS — K750 Abscess of liver: Secondary | ICD-10-CM | POA: Diagnosis present

## 2022-02-15 LAB — AEROBIC/ANAEROBIC CULTURE W GRAM STAIN (SURGICAL/DEEP WOUND): Culture: NO GROWTH

## 2022-02-15 LAB — CBC WITH DIFFERENTIAL/PLATELET
Abs Immature Granulocytes: 0.07 10*3/uL (ref 0.00–0.07)
Basophils Absolute: 0 10*3/uL (ref 0.0–0.1)
Basophils Relative: 0 %
Eosinophils Absolute: 0.1 10*3/uL (ref 0.0–0.5)
Eosinophils Relative: 1 %
HCT: 29.1 % — ABNORMAL LOW (ref 36.0–46.0)
Hemoglobin: 9.1 g/dL — ABNORMAL LOW (ref 12.0–15.0)
Immature Granulocytes: 1 %
Lymphocytes Relative: 23 %
Lymphs Abs: 2.8 10*3/uL (ref 0.7–4.0)
MCH: 27.7 pg (ref 26.0–34.0)
MCHC: 31.3 g/dL (ref 30.0–36.0)
MCV: 88.7 fL (ref 80.0–100.0)
Monocytes Absolute: 0.6 10*3/uL (ref 0.1–1.0)
Monocytes Relative: 5 %
Neutro Abs: 8.5 10*3/uL — ABNORMAL HIGH (ref 1.7–7.7)
Neutrophils Relative %: 70 %
Platelets: 646 10*3/uL — ABNORMAL HIGH (ref 150–400)
RBC: 3.28 MIL/uL — ABNORMAL LOW (ref 3.87–5.11)
RDW: 16.7 % — ABNORMAL HIGH (ref 11.5–15.5)
WBC: 12.2 10*3/uL — ABNORMAL HIGH (ref 4.0–10.5)
nRBC: 0 % (ref 0.0–0.2)

## 2022-02-15 LAB — BASIC METABOLIC PANEL
Anion gap: 8 (ref 5–15)
BUN: <5 mg/dL — ABNORMAL LOW (ref 6–20)
CO2: 27 mmol/L (ref 22–32)
Calcium: 8.3 mg/dL — ABNORMAL LOW (ref 8.9–10.3)
Chloride: 102 mmol/L (ref 98–111)
Creatinine, Ser: 0.79 mg/dL (ref 0.44–1.00)
GFR, Estimated: >60 mL/min (ref >60)
Glucose, Bld: 88 mg/dL (ref 70–99)
Potassium: 3.3 mmol/L — ABNORMAL LOW (ref 3.5–5.1)
Sodium: 137 mmol/L (ref 135–145)

## 2022-02-15 MED ORDER — POTASSIUM CHLORIDE 10 MEQ/100ML IV SOLN
10.0000 meq | INTRAVENOUS | Status: AC
  Administered 2022-02-15 (×4): 10 meq via INTRAVENOUS
  Filled 2022-02-15 (×4): qty 100

## 2022-02-15 MED ORDER — POTASSIUM CHLORIDE 20 MEQ PO PACK
40.0000 meq | PACK | Freq: Two times a day (BID) | ORAL | Status: DC
  Filled 2022-02-15: qty 2

## 2022-02-15 NOTE — Progress Notes (Signed)
PHARMACY CONSULT NOTE FOR:  OUTPATIENT  PARENTERAL ANTIBIOTIC THERAPY (OPAT)  Indication: Intra-abd abscesses Regimen: Ceftriaxone 2g IV every 24 hours and Metronidazole 500 mg po every 12 hours End date: 03/09/22  Other antibiotic orders include: Zyvox 600 mg po every 12 hours thru 7/7 (2 weeks total of MRSA coverage), Doxycycline 100 mg po every 12 hours thru 7/11 (2 weeks from OR date on 7/27)  IV antibiotic discharge orders are pended. To discharging provider:  please sign these orders via discharge navigator,  Select New Orders & click on the button choice - Manage This Unsigned Work.     Thank you for allowing pharmacy to be a part of this patient's care.  Georgina Pillion, PharmD, BCPS Infectious Diseases Clinical Pharmacist 02/15/2022 10:35 AM   **Pharmacist phone directory can now be found on [email or web address] ([email or web address]).  Listed under [facility].

## 2022-02-15 NOTE — Progress Notes (Addendum)
Patient ID: Paula Lucas, female   DOB: 11/04/1990, 31 y.o.   MRN: [id number]   Assessment/Plan: Principal Problem:   Pelvic abscess  Active Problems:   Sepsis (HCC)   Left lower quadrant abdominal pain   MRSA (methicillin resistant staph aureus) culture positive   Hepatic abscess  Pelvic abscess/ Hepatic abscess Patient has multiple abscesses in her abdomen and pelvis related to prior PID with current GC and Chlamydia negative. Patient has had IR drains placed x2 and has been on IV antibiotics for many days without significant improvement and her level of pain.  She continues to have mildly elevated temp as well as WBC. Given poor response to conservative measures suspect she will need surgical drainage. As stated by my prior colleague and given her perihepatic abscesses strongly suggest general surgery see this patient and we coordinate a surgical plan that may or may not involve removal of her tubes as well as her uterus from our standpoint with hope to preserve her ovaries.  There is very likely inflamed bowel that will be adherent to almost every structure and the chance of injury is high.  Surgery is planned with General surgery tomorrow around 11. NPO after midnight- Will hold lovenox.  Continue Vancomycin, Rocephin, Doxycycline, Flagyl--appreciate ID input Pain medications as needed  Sepsis [facility]) is not currently actively septic however surgical exploration would certainly increase this possibility.  MRSA (methicillin resistant staph aureus) culture positive On vancomycin  Hypokalemia Replete and trend  Subjective: Interval History: Patient reports increasing swelling on the left side of her abdomen.  She has significant rib pain as well as pain in her lower back and abdomen.  She underwent imaging yesterday which shows no significant change in her abscesses with exception of small diminishment of size.  There are multiple and they continue to be  present.  Objective: Vital signs in last 24 hours: Vitals:   02/14/22 2347 02/15/22 0417 02/15/22 0424 02/15/22 0827  BP: 125/79 131/82  119/80  Pulse: (!) 116 (!) 110    Resp: 17 19    Temp: 98.8 F (37.1 C) (!) 100.7 F (38.2 C)    TempSrc: Oral Oral    SpO2: 90% (!) 83% 94%   Weight:      Height:         Intake/Output from previous day: 06/25 0701 - 06/26 0700 In: 1041.9 [P.O.:600; I.V.:5] Out: 10 [Drains:10] Intake/Output this shift: No intake/output data recorded.  General appearance: alert, cooperative, and appears stated age Head: Normocephalic, without obvious abnormality, atraumatic Lungs: Normal effort Heart: Tacky rate regular rhythm Abdomen: Diffusely distended, diffusely tender with rebound Extremities: Homans sign is negative, no sign of DVT Skin: Skin color, texture, turgor normal. No rashes or lesions Neurologic: Grossly normal  Results for orders placed or performed during the hospital encounter of 02/08/22 (from the past 24 hour(s))  CBC with Differential/Platelet     Status: Abnormal   Collection Time: 02/15/22  2:03 AM  Result Value Ref Range   WBC 12.2 (H) 4.0 - 10.5 K/uL   RBC 3.28 (L) 3.87 - 5.11 MIL/uL   Hemoglobin 9.1 (L) 12.0 - 15.0 g/dL   HCT 40.9 (L) 81.1 - 91.4 %   MCV 88.7 80.0 - 100.0 fL   MCH 27.7 26.0 - 34.0 pg   MCHC 31.3 30.0 - 36.0 g/dL   RDW 78.2 (H) 95.6 - 21.3 %   Platelets 646 (H) 150 - 400 K/uL   nRBC 0.0 0.0 - 0.2 %  Neutrophils Relative % 70 %   Neutro Abs 8.5 (H) 1.7 - 7.7 K/uL   Lymphocytes Relative 23 %   Lymphs Abs 2.8 0.7 - 4.0 K/uL   Monocytes Relative 5 %   Monocytes Absolute 0.6 0.1 - 1.0 K/uL   Eosinophils Relative 1 %   Eosinophils Absolute 0.1 0.0 - 0.5 K/uL   Basophils Relative 0 %   Basophils Absolute 0.0 0.0 - 0.1 K/uL   Immature Granulocytes 1 %   Abs Immature Granulocytes 0.07 0.00 - 0.07 K/uL  Basic metabolic panel     Status: Abnormal   Collection Time: 02/15/22  2:03 AM  Result Value Ref  Range   Sodium 137 135 - 145 mmol/L   Potassium 3.3 (L) 3.5 - 5.1 mmol/L   Chloride 102 98 - 111 mmol/L   CO2 27 22 - 32 mmol/L   Glucose, Bld 88 70 - 99 mg/dL   BUN <5 (L) 6 - 20 mg/dL   Creatinine, Ser 4.09 0.44 - 1.00 mg/dL   Calcium 8.3 (L) 8.9 - 10.3 mg/dL   GFR, Estimated >81 >19 mL/min   Anion gap 8 5 - 15    Studies/Results: CT ABDOMEN PELVIS W CONTRAST  Result Date: 02/14/2022 CLINICAL DATA:  Follow-up intra-abdominal abscesses. EXAM: CT ABDOMEN AND PELVIS WITH CONTRAST TECHNIQUE: Multidetector CT imaging of the abdomen and pelvis was performed using the standard protocol following bolus administration of intravenous contrast. RADIATION DOSE REDUCTION: This exam was performed according to the departmental dose-optimization program which includes automated exposure control, adjustment of the mA and/or kV according to patient size and/or use of iterative reconstruction technique. CONTRAST:  OMNIPAQUE IOHEXOL 300 MG/ML  SOLN COMPARISON:  CT scan 02/08/2022 FINDINGS: Lower chest: Persistent moderate-sized pleural effusions with overlying atelectasis. Hepatobiliary: Small residual lentiform rim enhancing abscess at the dome of the liver anteriorly measuring a maximum of 5.6 x 1.1 cm. This previously measured approximately 9.3 x 1.1 cm. Inferiorly there is a persistent perihepatic abscess. It measures approximately 9.5 x 3.1 cm and previously measured 10.1 x 3.7 cm. No worrisome hepatic lesions or intrahepatic biliary dilatation. The gallbladder is unremarkable. No common bile duct dilatation. Pancreas: No mass, inflammation or ductal dilatation. Spleen: Normal size.  No focal lesions. Adrenals/Urinary Tract: Adrenal glands and kidneys are unremarkable and stable. The bladder is grossly normal. Stomach/Bowel: The stomach, duodenum, small bowel and colon grossly normal. Vascular/Lymphatic: The aorta and branch vessels are patent. The major venous structures are patent. Stable borderline  enlarged mesenteric and retroperitoneal lymph nodes, likely reactive/inflammatory. Reproductive: The uterus and ovaries are stable. Other: Percutaneous drainage catheter in the upper right pelvis with a persistent rim enhancing abscess measuring approximately 8.8 x 3.5 cm and previously measuring 9.8 x 5.6 cm. A second drainage catheter in the lower right cul-de-sac area with a small persistent rim enhancing abscess measuring 4.1 x 2.0 cm. This previously measured 5.2 x 4.9 cm. Left-sided rounded pelvic abscess measures 3.6 x 2.9 cm on image 71/3. This previously measured 3.5 x 2.7 cm. Persistent diffuse mesenteric and subcutaneous inflammations/edema. Musculoskeletal: No significant bony findings. IMPRESSION: 1. Persistent moderate-sized pleural effusions with overlying atelectasis. 2. Persistent but slightly smaller undrained perihepatic abscesses. 3. Two right-sided pelvic drainage catheters with persistent but smaller surrounding abscesses. 4. Left-sided undrained pelvic abscess is slightly smaller also. 5. Persistent diffuse mesenteric and subcutaneous inflammation. 6. Stable borderline enlarged mesenteric and retroperitoneal lymph nodes, likely reactive/inflammatory. Electronically Signed   By: Rudie Meyer M.D.   On: 02/14/2022 14:46  MR Lumbar Spine W Wo Contrast  Result Date: 02/13/2022 CLINICAL DATA:  Initial evaluation for back pain, history of MRSA bacteremia. EXAM: MRI LUMBAR SPINE WITHOUT AND WITH CONTRAST TECHNIQUE: Multiplanar and multiecho pulse sequences of the lumbar spine were obtained without and with intravenous contrast. CONTRAST:  7.23mL GADAVIST GADOBUTROL 1 MMOL/ML IV SOLN COMPARISON:  Prior CT from 02/08/2022. FINDINGS: Segmentation: Standard. Lowest well-formed disc space labeled the L5-S1 level. Alignment: Mild levoscoliosis with straightening of the normal lumbar lordosis. No listhesis. Vertebrae: Vertebral body height maintained without acute or chronic fracture. Bone marrow  signal intensity diffusely decreased on T1 weighted sequence, nonspecific, but most commonly related to anemia, smoking or obesity. No discrete or worrisome osseous lesions. No evidence for osteomyelitis discitis or septic arthritis within the lumbar spine. Conus medullaris and cauda equina: Conus extends to the L1-2 level. Conus and cauda equina appear normal. Paraspinal and other soft tissues: Paraspinous soft tissues demonstrate no acute finding. Multi loculated collections/abscesses with surrounding enhancement partially seen within the partially visualized pelvis, better characterized on recent CT. Remainder of the visualized visceral structures otherwise unremarkable. Disc levels: L1-2:  Unremarkable. L2-3: Small left subarticular disc protrusion mildly indents the left ventral thecal sac (series 12, image 16). Borderline mild narrowing of the left lateral recess. Central canal remains widely patent. No foraminal stenosis. L3-4:  Unremarkable. L4-5:  Unremarkable. L5-S1: Left subarticular disc protrusion extends into the left lateral recess, contacting the descending left S1 nerve root (series 12, image 34). Resultant mild left lateral recess stenosis. Central canal remains patent. No significant foraminal stenosis. IMPRESSION: 1. No MRI evidence for acute infection within the lumbar spine. 2. Left subarticular disc protrusion at L5-S1, contacting the descending left S1 nerve root in the left lateral recess. 3. Small left subarticular disc protrusion at L2-3 with borderline mild left lateral recess stenosis. 4. Multiloculated collections/abscesses within the partially visualized pelvis, better characterized on recent CT. Electronically Signed   By: Rise Mu M.D.   On: 02/13/2022 04:04   ECHOCARDIOGRAM COMPLETE  Result Date: 02/12/2022    ECHOCARDIOGRAM REPORT   Patient Name:   Paula Lucas Date of Exam: 02/12/2022 Medical Rec #:  [id number]        Height:       63.0 in Accession #:     [id number]       Weight:       169.5 lb Date of Birth:  09-20-1990        BSA:          1.802 m Patient Age:    31 years         BP:           113/87 mmHg Patient Gender: F                HR:           98 bpm. Exam Location:  Inpatient Procedure: 2D Echo, Cardiac Doppler and Color Doppler Indications:   Staph aureus infection  History:       Patient has no prior history of Echocardiogram examinations.                Sepsis.  Sonographer:   Ross Ludwig RDCS (AE) Referring      TRUNG T VU Phys: IMPRESSIONS  1. Left ventricular ejection fraction, by estimation, is 60 to 65%. The left ventricle has normal function. The left ventricle has no regional wall motion abnormalities. Left ventricular diastolic parameters were normal.  2. Right ventricular  systolic function is normal. The right ventricular size is normal.  3. The mitral valve is normal in structure. Trivial mitral valve regurgitation. No evidence of mitral stenosis.  4. The aortic valve is normal in structure. Aortic valve regurgitation is not visualized. No aortic stenosis is present. FINDINGS  Left Ventricle: Left ventricular ejection fraction, by estimation, is 60 to 65%. The left ventricle has normal function. The left ventricle has no regional wall motion abnormalities. The left ventricular internal cavity size was normal in size. There is  no left ventricular hypertrophy. Left ventricular diastolic parameters were normal. Right Ventricle: The right ventricular size is normal. Right vetricular wall thickness was not well visualized. Right ventricular systolic function is normal. Left Atrium: Left atrial size was normal in size. Right Atrium: Right atrial size was normal in size. Pericardium: There is no evidence of pericardial effusion. Mitral Valve: The mitral valve is normal in structure. Trivial mitral valve regurgitation. No evidence of mitral valve stenosis. Tricuspid Valve: The tricuspid valve is normal in structure. Tricuspid valve regurgitation is  trivial. Aortic Valve: The aortic valve is normal in structure. Aortic valve regurgitation is not visualized. No aortic stenosis is present. Aortic valve mean gradient measures 4.0 mmHg. Aortic valve peak gradient measures 7.1 mmHg. Aortic valve area, by VTI measures 2.25 cm. Pulmonic Valve: The pulmonic valve was normal in structure. Pulmonic valve regurgitation is not visualized. Aorta: The aortic root and ascending aorta are structurally normal, with no evidence of dilitation. IAS/Shunts: The atrial septum is grossly normal.  LEFT VENTRICLE PLAX 2D LVIDd:         4.80 cm LVIDs:         3.40 cm LV PW:         0.90 cm LV IVS:        1.10 cm LVOT diam:     1.90 cm LV SV:         49 LV SV Index:   27 LVOT Area:     2.84 cm  RIGHT VENTRICLE             IVC RV Basal diam:  2.30 cm     IVC diam: 1.50 cm RV S prime:     12.40 cm/s TAPSE (M-mode): 2.1 cm LEFT ATRIUM             Index        RIGHT ATRIUM          Index LA diam:        3.80 cm 2.11 cm/m   RA Area:     9.13 cm LA Vol (A2C):   26.4 ml 14.65 ml/m  RA Volume:   15.80 ml 8.77 ml/m LA Vol (A4C):   29.7 ml 16.48 ml/m LA Biplane Vol: 29.3 ml 16.26 ml/m  AORTIC VALVE AV Area (Vmax):    2.24 cm AV Area (Vmean):   2.21 cm AV Area (VTI):     2.25 cm AV Vmax:           133.00 cm/s AV Vmean:          87.900 cm/s AV VTI:            0.218 m AV Peak Grad:      7.1 mmHg AV Mean Grad:      4.0 mmHg LVOT Vmax:         105.00 cm/s LVOT Vmean:        68.600 cm/s LVOT VTI:  0.173 m LVOT/AV VTI ratio: 0.79  AORTA Ao Root diam: 2.90 cm Ao Asc diam:  2.70 cm  SHUNTS Systemic VTI:  0.17 m Systemic Diam: 1.90 cm Kristeen Miss MD Electronically signed by Kristeen Miss MD Signature Date/Time: 02/12/2022/4:39:15 PM    Final    VAS [place] LOWER EXTREMITY VENOUS (DVT)  Result Date: 02/12/2022  Lower Venous DVT Study Patient Name:  Paula Lucas  Date of Exam:   02/12/2022 Medical Rec #: [id number]         Accession #:    [id number] Date of Birth: 1990/09/23          Patient Gender: F Patient Age:   41 years [facility]am [facility] Procedure:      VAS [place] LOWER EXTREMITY VENOUS (DVT) Referring Phys: JULIE MACHEN --------------------------------------------------------------------------------  Indications: Pain.  Risk Factors: None identified. Limitations: Poor patient cooperation, patient pain tolerance. Comparison Study: No prior studies. Performing Technologist: Chanda Busing RVT  Examination Guidelines: A complete evaluation includes B-mode imaging, spectral Doppler, color Doppler, and power Doppler as needed of all accessible portions of each vessel. Bilateral testing is considered an integral part of a complete examination. Limited examinations for reoccurring indications may be performed as noted. The reflux portion of the exam is performed with the patient in reverse Trendelenburg.  +---------+---------------+---------+-----------+----------+--------------+ RIGHT    CompressibilityPhasicitySpontaneityPropertiesThrombus Aging +---------+---------------+---------+-----------+----------+--------------+ CFV      Full           Yes      Yes                                 +---------+---------------+---------+-----------+----------+--------------+ SFJ      Full                                                        +---------+---------------+---------+-----------+----------+--------------+ FV Prox  Full                                                        +---------+---------------+---------+-----------+----------+--------------+ FV Mid   Full                                                        +---------+---------------+---------+-----------+----------+--------------+ FV DistalFull                                                        +---------+---------------+---------+-----------+----------+--------------+ PFV      Full                                                         +---------+---------------+---------+-----------+----------+--------------+ POP  Full           Yes      Yes                                 +---------+---------------+---------+-----------+----------+--------------+ PTV      Full                                                        +---------+---------------+---------+-----------+----------+--------------+ PERO     Full                                                        +---------+---------------+---------+-----------+----------+--------------+   +----+---------------+---------+-----------+----------+--------------+ LEFTCompressibilityPhasicitySpontaneityPropertiesThrombus Aging +----+---------------+---------+-----------+----------+--------------+ CFV Full           Yes      Yes                                 +----+---------------+---------+-----------+----------+--------------+    Summary: RIGHT: - There is no evidence of deep vein thrombosis in the lower extremity.  - No cystic structure found in the popliteal fossa.  LEFT: - No evidence of common femoral vein obstruction.  *See table(s) above for measurements and observations. Electronically signed by Sherald Hess MD on 02/12/2022 at 11:23:51 AM.    Final    CT IMAGE GUIDED DRAINAGE BY PERCUTANEOUS CATHETER  Result Date: 02/10/2022 INDICATION: History of tubo-ovarian abscess, post CT-guided placement right lower abdominal/pelvic drainage catheter on 02/05/2022. Postprocedural CT scan performed 02/08/2022 demonstrates an unchanged fluid collection within the right lower pelvis and as such request made for placement of an additional percutaneous drainage catheter for infection source control purposes. Additionally, there is request for CT-guided exchange and/or repositioning of the pre-existing drainage catheter into the more dominant component of the residual pelvic abscess EXAM: 1. CT GUIDED RIGHT TRANS GLUTEAL APPROACH TUBO-OVARIAN ABSCESS DRAINAGE CATHETER  PLACEMENT 2. CT-GUIDED EXCHANGE AND REPOSITIONING OF EXISTING RIGHT LOWER ABDOMINAL/PELVIC DRAINAGE CATHETER COMPARISON:  CT abdomen pelvis-02/08/2022; 02/04/2022 CT guided right lower abdominal/pelvic abscess drainage catheter placement-02/05/2022 MEDICATIONS: The patient is currently admitted to the hospital and receiving intravenous antibiotics. The antibiotics were administered within an appropriate time frame prior to the initiation of the procedure. ANESTHESIA/SEDATION: Moderate (conscious) sedation was employed during this procedure as administered by the Interventional Radiology RN. A total of Versed 4 mg, Dilaudid 1 mg and Fentanyl 150 mcg was administered intravenously. Moderate Sedation Time: 60 minutes. The patient's level of consciousness and vital signs were monitored continuously by radiology nursing throughout the procedure under my direct supervision. CONTRAST:  None COMPLICATIONS: None immediate. PROCEDURE: RADIATION DOSE REDUCTION: This exam was performed according to the departmental dose-optimization program which includes automated exposure control, adjustment of the mA and/or kV according to patient size and/or use of iterative reconstruction technique. Informed written consent was obtained from the patient after a discussion of the risks, benefits and alternatives to treatment. The patient was initially placed prone on the CT gantry and a pre procedural CT was performed re-demonstrating the known abscess/fluid collection within the right hemipelvis with dominant component measuring approximately  5.5 x 5.4 cm (image 11, series 2). The procedure was planned. A timeout was performed prior to the initiation of the procedure. The skin overlying the right buttocks was prepped and draped in the usual sterile fashion. The overlying soft tissues were anesthetized with 1% lidocaine with epinephrine. Appropriate trajectory was planned with the use of a 22 gauge spinal needle. An 18 gauge trocar needle  was advanced into the abscess/fluid collection and a short Amplatz super stiff wire was coiled within the collection. Appropriate positioning was confirmed with a limited CT scan. The tract was serially dilated allowing placement of a 10 [place] all-purpose drainage catheter. Appropriate positioning was confirmed with a limited postprocedural CT scan. Approximately 65 ml of purulent appearing serous fluid was aspirated. The tube was connected to a drainage bag and secured in place within interrupted suture and a Stat Lock device. A dressing was applied. The patient was then repositioned supine on the CT gantry and noncontrast images were obtained of the lower pelvis and pre-existing right anterior approach lower abdominal/pelvic drainage catheter. Attempts were made to retract the existing drainage catheter into the dominant component of the residual collection however this proved uncomfortable for the patient. As such, the external portion of the drainage catheter was prepped and draped in the usual sterile fashion. The external portion of the drainage catheter was cut and cannulated with a short Amplatz wire. Existing 12 French drainage catheter was then exchanged for a new 12 French percutaneous catheter which under intermittent CT guidance was repositioned into the dominant residual component of the right lower quadrant collection (series 11). The drainage catheter was connected to a gravity bag and secured in place within interrupted suture and a Stat Lock device. Patient tolerated the above procedures well (though did require a generous amount of conscious sedation medications) without immediate postprocedural complication. IMPRESSION: 1. Successful CT guided placement of a 10 [place] all purpose drain catheter into the undrained pelvic abscess via right trans gluteal approach with aspiration of 65 mL of purulent fluid. Samples were sent to the laboratory as requested by the ordering clinical team. 2. Successful  CT guided exchange and repositioning of pre-existing 12 French right anterior abdominal/pelvic drainage catheter with end now coiled and locked within the residual anteriorly located abscess cavity. Electronically Signed   By: Simonne Come M.D.   On: 02/10/2022 16:33   CT ABSCESS CATH EXCHANGE  Result Date: 02/10/2022 INDICATION: History of tubo-ovarian abscess, post CT-guided placement right lower abdominal/pelvic drainage catheter on 02/05/2022. Postprocedural CT scan performed 02/08/2022 demonstrates an unchanged fluid collection within the right lower pelvis and as such request made for placement of an additional percutaneous drainage catheter for infection source control purposes. Additionally, there is request for CT-guided exchange and/or repositioning of the pre-existing drainage catheter into the more dominant component of the residual pelvic abscess EXAM: 1. CT GUIDED RIGHT TRANS GLUTEAL APPROACH TUBO-OVARIAN ABSCESS DRAINAGE CATHETER PLACEMENT 2. CT-GUIDED EXCHANGE AND REPOSITIONING OF EXISTING RIGHT LOWER ABDOMINAL/PELVIC DRAINAGE CATHETER COMPARISON:  CT abdomen pelvis-02/08/2022; 02/04/2022 CT guided right lower abdominal/pelvic abscess drainage catheter placement-02/05/2022 MEDICATIONS: The patient is currently admitted to the hospital and receiving intravenous antibiotics. The antibiotics were administered within an appropriate time frame prior to the initiation of the procedure. ANESTHESIA/SEDATION: Moderate (conscious) sedation was employed during this procedure as administered by the Interventional Radiology RN. A total of Versed 4 mg, Dilaudid 1 mg and Fentanyl 150 mcg was administered intravenously. Moderate Sedation Time: 60 minutes. The patient's level of consciousness and vital signs  were monitored continuously by radiology nursing throughout the procedure under my direct supervision. CONTRAST:  None COMPLICATIONS: None immediate. PROCEDURE: RADIATION DOSE REDUCTION: This exam was  performed according to the departmental dose-optimization program which includes automated exposure control, adjustment of the mA and/or kV according to patient size and/or use of iterative reconstruction technique. Informed written consent was obtained from the patient after a discussion of the risks, benefits and alternatives to treatment. The patient was initially placed prone on the CT gantry and a pre procedural CT was performed re-demonstrating the known abscess/fluid collection within the right hemipelvis with dominant component measuring approximately 5.5 x 5.4 cm (image 11, series 2). The procedure was planned. A timeout was performed prior to the initiation of the procedure. The skin overlying the right buttocks was prepped and draped in the usual sterile fashion. The overlying soft tissues were anesthetized with 1% lidocaine with epinephrine. Appropriate trajectory was planned with the use of a 22 gauge spinal needle. An 18 gauge trocar needle was advanced into the abscess/fluid collection and a short Amplatz super stiff wire was coiled within the collection. Appropriate positioning was confirmed with a limited CT scan. The tract was serially dilated allowing placement of a 10 [place] all-purpose drainage catheter. Appropriate positioning was confirmed with a limited postprocedural CT scan. Approximately 65 ml of purulent appearing serous fluid was aspirated. The tube was connected to a drainage bag and secured in place within interrupted suture and a Stat Lock device. A dressing was applied. The patient was then repositioned supine on the CT gantry and noncontrast images were obtained of the lower pelvis and pre-existing right anterior approach lower abdominal/pelvic drainage catheter. Attempts were made to retract the existing drainage catheter into the dominant component of the residual collection however this proved uncomfortable for the patient. As such, the external portion of the drainage catheter  was prepped and draped in the usual sterile fashion. The external portion of the drainage catheter was cut and cannulated with a short Amplatz wire. Existing 12 French drainage catheter was then exchanged for a new 12 French percutaneous catheter which under intermittent CT guidance was repositioned into the dominant residual component of the right lower quadrant collection (series 11). The drainage catheter was connected to a gravity bag and secured in place within interrupted suture and a Stat Lock device. Patient tolerated the above procedures well (though did require a generous amount of conscious sedation medications) without immediate postprocedural complication. IMPRESSION: 1. Successful CT guided placement of a 10 [place] all purpose drain catheter into the undrained pelvic abscess via right trans gluteal approach with aspiration of 65 mL of purulent fluid. Samples were sent to the laboratory as requested by the ordering clinical team. 2. Successful CT guided exchange and repositioning of pre-existing 12 French right anterior abdominal/pelvic drainage catheter with end now coiled and locked within the residual anteriorly located abscess cavity. Electronically Signed   By: Simonne Come M.D.   On: 02/10/2022 16:33   CT ABDOMEN PELVIS W CONTRAST  Result Date: 02/08/2022 CLINICAL DATA:  Sepsis EXAM: CT ABDOMEN AND PELVIS WITH CONTRAST TECHNIQUE: Multidetector CT imaging of the abdomen and pelvis was performed using the standard protocol following bolus administration of intravenous contrast. RADIATION DOSE REDUCTION: This exam was performed according to the departmental dose-optimization program which includes automated exposure control, adjustment of the mA and/or kV according to patient size and/or use of iterative reconstruction technique. CONTRAST:  OMNIPAQUE IOHEXOL 300 MG/ML  SOLN COMPARISON:  Previous studies including the  CT done on 02/04/2022 FINDINGS: Lower chest: Small bilateral pleural  effusions are seen more so on the right side. There are linear patchy infiltrates in the lingula and both lower lobes suggesting atelectasis/pneumonia. Hepatobiliary: No focal abnormality is seen in the liver. There is no dilation of bile ducts. Gallbladder is unremarkable. Small ascites is noted in the perihepatic region. Pancreas: No focal abnormality is seen. Spleen: Unremarkable. Adrenals/Urinary Tract: Adrenals are not enlarged. There is no hydronephrosis. There are no renal or ureteral stones. Urinary bladder is unremarkable. There is 3 mm low-density in the midportion of left kidney which is too small to be characterized. Stomach/Bowel: Stomach is not distended. Small bowel loops are not dilated. Appendix is not seen. There is no significant wall thickening in the colon. There is interval placement of percutaneous drainage catheter with its tip in the right lower abdomen. There is interval decrease in size of the complex fluid collection in the right lower quadrant. There is 4 cm loculated fluid collection slightly to the left of midline at the level of pelvic inlet. There is 7.2 x 5.3 x 4.9 cm loculated fluid collection with minimally enhancing margin in the cul-de-sac in the pelvis. There are no pockets of air within these loculated fluid collections. There is stranding in the fat planes in the lower abdomen and pelvis. Vascular/Lymphatic: Vascular structures are unremarkable. Reproductive: Uterus is to the right of midline. Other: There is no pneumoperitoneum. Musculoskeletal: No acute findings are seen. IMPRESSION: Interval placement of percutaneous drainage catheter within air-fluid collection in the right lower abdomen with interval decrease in size. There is 7.2 cm loculated fluid collection in the cul-de-sac in the pelvis with slightly enhancing margins. There is another 4 cm loculated fluid collection in the left side of the pelvis. These may suggest loculated serous fluid collections or abscesses.  Small ascites is noted in the perihepatic region. There is no evidence of intestinal obstruction or pneumoperitoneum. There is no hydronephrosis. Small bilateral pleural effusions. There are linear patchy infiltrates in both lower lung fields suggesting atelectasis/pneumonia. Electronically Signed   By: Ernie Avena M.D.   On: 02/08/2022 09:41   CT IMAGE GUIDED DRAINAGE BY PERCUTANEOUS CATHETER  Result Date: 02/05/2022 INDICATION: PID with large abdominopelvic fluid collection. EXAM: CT GUIDED DRAINAGE CATHETER PLACEMENT INTO ABDOMINOPELVIC FLUID COLLECTION RADIATION DOSE REDUCTION: This exam was performed according to the departmental dose-optimization program which includes automated exposure control, adjustment of the mA and/or kV according to patient size and/or use of iterative reconstruction technique. COMPARISON:  CT AP, 02/04/2022 MEDICATIONS: The patient is currently admitted to the hospital and receiving intravenous antibiotics. The antibiotics were administered within an appropriate time frame prior to the initiation of the procedure. ANESTHESIA/SEDATION: Moderate (conscious) sedation was employed during this procedure. A total of Versed 3 mg and Fentanyl 75 mcg was administered intravenously. Additionally, 1 mg Dilaudid was administered IV. Moderate Sedation Time: 19 minutes. The patient's level of consciousness and vital signs were monitored continuously by radiology nursing throughout the procedure under my direct supervision. CONTRAST:  None COMPLICATIONS: None immediate. PROCEDURE: Informed written consent was obtained from the patient and/or patient's representative after a discussion of the risks, benefits and alternatives to treatment. The patient was placed supine on the CT gantry and a pre procedural CT was performed re-demonstrating the known abscess/fluid collection within the lower abdomen/pelvis. The procedure was planned. A timeout was performed prior to the initiation of the  procedure. The RIGHT lower quadrant was prepped and draped in the usual sterile  fashion. The overlying soft tissues were anesthetized with 1% lidocaine with epinephrine. Appropriate trajectory was planned with the use of a 22 gauge spinal needle. An 18 gauge trocar needle was advanced into the abscess/fluid collection and a short Amplatz super stiff wire was coiled within the collection. Appropriate positioning was confirmed with a limited CT scan. The tract was serially dilated allowing placement of a 12 [place] all-purpose drainage catheter. Appropriate positioning was confirmed with a limited postprocedural CT scan. 5 mL ml of serous fluid was aspirated and submitted for analysis. The tube was connected to a drainage bag and sutured in place. A dressing was placed. The patient tolerated the procedure well without immediate post procedural complication. IMPRESSION: Successful CT guided placement of a 12 Fr drainage catheter into the abdominopelvic collection with aspiration of serous fluid, as above. Samples were sent to the laboratory as requested by the ordering clinical team. Roanna Banning, MD [facility] Electronically Signed   By: Roanna Banning M.D.   On: 02/05/2022 18:08   CT ABDOMEN PELVIS WO CONTRAST  Result Date: 02/04/2022 CLINICAL DATA:  Abdominal pain, acute, nonlocalized septic shock, abnormal uterine US, concern for PID EXAM: CT ABDOMEN AND PELVIS WITHOUT CONTRAST TECHNIQUE: Multidetector CT imaging of the abdomen and pelvis was performed following the standard protocol without IV contrast. RADIATION DOSE REDUCTION: This exam was performed according to the departmental dose-optimization program which includes automated exposure control, adjustment of the mA and/or kV according to patient size and/or use of iterative reconstruction technique. COMPARISON:  Pelvic ultrasound today FINDINGS: Lower chest: Pleural thickening noted peripherally in  the right lower hemithorax, possibly representing trace loculated pleural effusion. No confluent airspace opacities. Hepatobiliary: No focal hepatic abnormality. Gallbladder unremarkable. Pancreas: No focal abnormality or ductal dilatation. Spleen: No focal abnormality.  Normal size. Adrenals/Urinary Tract: No adrenal abnormality. No focal renal abnormality. No stones or hydronephrosis. Urinary bladder is unremarkable. Stomach/Bowel: Wall thickening noted in the cecum which is immediately adjacent to the large pelvic fluid collection described below. This could reflect colitis or be secondarily inflamed. Stomach and small bowel grossly unremarkable. Vascular/Lymphatic: No evidence of aneurysm or adenopathy. Reproductive: Large complex cystic area or fluid collection seen in the pelvis extending from the cul-de-sac superiorly into the right lower quadrant adjacent to the cecum. This measures approximately 12 x 8 cm on image 64 of series 3. Internal areas of increased density. This could reflect blood or abscess/PID. Uterus grossly unremarkable on this noncontrast study. Ovaries not definitively visualized. Other: There is free fluid adjacent to the liver and spleen. Musculoskeletal: No acute bony abnormality. IMPRESSION: Complex process in the pelvis including what appears to be a large complex fluid collection measuring 12 x 8 cm extending from the cul-de-sac superiorly to the cecum which could reflect blood products or abscess. This also could conceivably be related to and ovary. Neither ovary definitively identified. Wall thickening in the cecum could reflect colitis or secondary inflammation related to the pelvic process. Perihepatic and perisplenic ascites. Possible trace loculated right pleural effusion peripherally in the right lower hemithorax. Electronically Signed   By: Charlett Nose M.D.   On: 02/04/2022 22:29   US PELVIC COMPLETE WITH TRANSVAGINAL  Addendum Date: 02/04/2022   ADDENDUM REPORT:  02/04/2022 13:44 ADDENDUM: In light of negative quantitative beta HCG and no documented pregnancy the possibility of infectious causes for the above findings may be more likely but findings are essentially quite nonspecific at this time. Even noncontrast CT could prove beneficial  to allow for assessment of pelvic fluid to determine whether this represents frank hemoperitoneum and to assess for any associated inflammatory changes that may be present not well assessed on ultrasound. These results were called by telephone at the time of interpretation on 02/04/2022 at 1:44 pm to provider [facility] , who verbally acknowledged these results. Electronically Signed   By: Donzetta Kohut M.D.   On: 02/04/2022 13:44   Result Date: 02/04/2022 CLINICAL DATA:  A 31 year old female presents with pelvic pain and bleeding. Patient reports positive pregnancy but urine pregnancy currently negative by report and beta HCG is currently pending. EXAM: TRANSABDOMINAL AND TRANSVAGINAL ULTRASOUND OF PELVIS TECHNIQUE: Both transabdominal and transvaginal ultrasound examinations of the pelvis were performed. Transabdominal technique was performed for global imaging of the pelvis including uterus, ovaries, adnexal regions, and pelvic cul-de-sac. It was necessary to proceed with endovaginal exam following the transabdominal exam to visualize the endometrium and adnexa. COMPARISON:  No recent imaging is available for comparison. FINDINGS: Uterus Measurements: 12.6 x 8.0 x 8.5 (volume = 450 cc) cm. = volume: 450 mL. Heterogeneous uterus with thickened endometrium. Cystic area in the endometrial canal that measures 4.5 x 2.6 x 4.4 cm. This cystic characteristics and central increased echogenicity. Central increased echogenicity the without discrete finding that would suggest embryo with very limited assessment due to patient discomfort and clinical condition. Endometrium Thickness: 3.3 cm. Thickened with cystic area in the fundus of the  uterus. Right ovary Measurements: 3.2 x 2.7 x 2.1 (volume = 9.5 cc) cm = volume: 9.5 mL. Normal appearance/no adnexal mass. Left ovary Not visible, in the area of the LEFT adnexa is heterogeneous material that has the appearance of blood products but conforms to the cul-de-sac and LEFT adnexa. Other findings Moderate heterogeneous fluid in the LEFT adnexa primarily but also tracking along the RIGHT uterus. IMPRESSION: Unusual cystic area in the endometrial canal. If this patient has any history of recent positive pregnancy test this could represent a failed gestation with retained products of conception. Pseudo gestational sac with areas of internal hemorrhage in the setting of ruptured ectopic pregnancy is also considered. Heterogeneous appearance of material in the cul-de-sac could also potentially represent sequela of pyogenic infection though this is lower on the differential at this time based on appearance. If the patient could tolerate further evaluation with cross-sectional imaging CT could potentially be helpful. Ultimately gyn consultation is suggested. Critical Value/emergent results were called by telephone at the time of interpretation on 02/04/2022 at 11:01 am to provider [facility] , who verbally acknowledged these results. Electronically Signed: By: Donzetta Kohut M.D. On: 02/04/2022 11:01   DG Chest Port 1 View  Result Date: 02/04/2022 CLINICAL DATA:  31 year old female with history of lower abdominal pain and vaginal bleeding 3 days ago. Fever. Low blood pressure. EXAM: PORTABLE CHEST 1 VIEW COMPARISON:  Chest x-ray 08/09/2021. FINDINGS: Lung volumes are slightly low. No consolidative airspace disease. No pleural effusions. No pneumothorax. No evidence of pulmonary edema. Heart size is normal. The patient is rotated to the right on today's exam, resulting in distortion of the mediastinal contours and reduced diagnostic sensitivity and specificity for mediastinal pathology. IMPRESSION: 1. No  radiographic evidence of acute cardiopulmonary disease. Electronically Signed   By: Trudie Reed M.D.   On: 02/04/2022 10:55    Scheduled Meds:  doxycycline  100 mg Oral Q12H   enoxaparin (LOVENOX) injection  40 mg Subcutaneous Q24H   feeding supplement  237 mL Oral BID BM   metroNIDAZOLE  500 mg Oral Q12H   multivitamin with minerals  1 tablet Oral Daily   nicotine  21 mg Transdermal Daily   polyethylene glycol  17 g Oral Daily   potassium chloride  40 mEq Oral BID   sodium chloride flush  3 mL Intravenous Q12H   sodium chloride flush  5 mL Intracatheter Q8H   traZODone  50 mg Oral QHS   Continuous Infusions:  cefTRIAXone (ROCEPHIN)  IV Stopped (02/14/22 1603)   vancomycin Stopped (02/14/22 2329)   PRN Meds:HYDROmorphone (DILAUDID) injection, hydrOXYzine, ketorolac, ondansetron **OR** ondansetron (ZOFRAN) IV, oxyCODONE, polyvinyl alcohol, simethicone, sodium chloride flush    LOS: 6 days   Reva Bores, MD 02/15/2022 10:06 AM

## 2022-02-15 NOTE — Progress Notes (Signed)
PT Cancellation Note  Patient Details Name: Paula Lucas MRN: [id number] DOB: 11-Sep-1990   Cancelled Treatment:    Reason Eval/Treat Not Completed: Other (comment) Per pt and RN, pt for likely procedure and wanting to hold on PT. Will follow up as schedule allows.   Cindee Salt, DPT  Acute Rehabilitation Services  Office: [phone]    Lehman Prom 02/15/2022, 10:54 AM

## 2022-02-16 ENCOUNTER — Encounter (HOSPITAL_COMMUNITY)
Admission: EM | Disposition: A | Discharge status: Rehabilitation Facility | Payer: Self-pay | Source: Home / Self Care | Attending: Internal Medicine

## 2022-02-16 ENCOUNTER — Inpatient Hospital Stay (HOSPITAL_COMMUNITY): Payer: Medicaid Other | Admitting: Anesthesiology

## 2022-02-16 ENCOUNTER — Other Ambulatory Visit: Payer: Self-pay

## 2022-02-16 ENCOUNTER — Encounter (HOSPITAL_COMMUNITY): Payer: Self-pay | Admitting: Internal Medicine

## 2022-02-16 DIAGNOSIS — K9172 Accidental puncture and laceration of a digestive system organ or structure during other procedure: Secondary | ICD-10-CM | POA: Diagnosis not present

## 2022-02-16 DIAGNOSIS — K635 Polyp of colon: Secondary | ICD-10-CM

## 2022-02-16 DIAGNOSIS — L0291 Cutaneous abscess, unspecified: Secondary | ICD-10-CM | POA: Diagnosis not present

## 2022-02-16 DIAGNOSIS — A419 Sepsis, unspecified organism: Secondary | ICD-10-CM

## 2022-02-16 DIAGNOSIS — N739 Female pelvic inflammatory disease, unspecified: Secondary | ICD-10-CM

## 2022-02-16 DIAGNOSIS — K750 Abscess of liver: Secondary | ICD-10-CM | POA: Diagnosis not present

## 2022-02-16 HISTORY — PX: LAPAROTOMY: SHX154

## 2022-02-16 HISTORY — PX: SMALL BOWEL REPAIR: SHX6447

## 2022-02-16 LAB — CBC
HCT: 25.2 % — ABNORMAL LOW (ref 36.0–46.0)
Hemoglobin: 8.2 g/dL — ABNORMAL LOW (ref 12.0–15.0)
MCH: 28.6 pg (ref 26.0–34.0)
MCHC: 32.5 g/dL (ref 30.0–36.0)
MCV: 87.8 fL (ref 80.0–100.0)
Platelets: 597 10*3/uL — ABNORMAL HIGH (ref 150–400)
RBC: 2.87 MIL/uL — ABNORMAL LOW (ref 3.87–5.11)
RDW: 16.9 % — ABNORMAL HIGH (ref 11.5–15.5)
WBC: 12.1 10*3/uL — ABNORMAL HIGH (ref 4.0–10.5)
nRBC: 0 % (ref 0.0–0.2)

## 2022-02-16 LAB — BASIC METABOLIC PANEL
Anion gap: 10 (ref 5–15)
BUN: <5 mg/dL — ABNORMAL LOW (ref 6–20)
CO2: 26 mmol/L (ref 22–32)
Calcium: 8.2 mg/dL — ABNORMAL LOW (ref 8.9–10.3)
Chloride: 101 mmol/L (ref 98–111)
Creatinine, Ser: 0.81 mg/dL (ref 0.44–1.00)
GFR, Estimated: >60 mL/min (ref >60)
Glucose, Bld: 84 mg/dL (ref 70–99)
Potassium: 3.5 mmol/L (ref 3.5–5.1)
Sodium: 137 mmol/L (ref 135–145)

## 2022-02-16 LAB — TYPE AND SCREEN
ABO/RH(D): B POS
Antibody Screen: NEGATIVE

## 2022-02-16 LAB — SURGICAL PCR SCREEN
MRSA, PCR: NEGATIVE
Staphylococcus aureus: NEGATIVE

## 2022-02-16 SURGERY — LAPAROTOMY, EXPLORATORY
Anesthesia: General | Site: Abdomen

## 2022-02-16 MED ORDER — DOCUSATE SODIUM 100 MG PO CAPS
100.0000 mg | ORAL_CAPSULE | Freq: Two times a day (BID) | ORAL | Status: DC
  Administered 2022-02-16 – 2022-02-19 (×6): 100 mg via ORAL
  Filled 2022-02-16 (×6): qty 1

## 2022-02-16 MED ORDER — CHLORHEXIDINE GLUCONATE 0.12 % MT SOLN
15.0000 mL | Freq: Once | OROMUCOSAL | Status: AC
Start: 2022-02-16 — End: 2022-02-16

## 2022-02-16 MED ORDER — AMISULPRIDE (ANTIEMETIC) 5 MG/2ML IV SOLN: 10.0000 mg | Freq: Once | INTRAVENOUS | Status: DC | PRN

## 2022-02-16 MED ORDER — 0.9 % SODIUM CHLORIDE (POUR BTL) OPTIME
TOPICAL | Status: DC | PRN
  Administered 2022-02-16 (×3): 1000 mL

## 2022-02-16 MED ORDER — HYDROMORPHONE HCL 1 MG/ML IJ SOLN
INTRAMUSCULAR | Status: DC | PRN
  Administered 2022-02-16 (×2): .5 mg via INTRAVENOUS

## 2022-02-16 MED ORDER — BUPIVACAINE HCL (PF) 0.25 % IJ SOLN
INTRAMUSCULAR | Status: AC
  Filled 2022-02-16: qty 30

## 2022-02-16 MED ORDER — DEXMEDETOMIDINE (PRECEDEX) IN NS 20 MCG/5ML (4 MCG/ML) IV SYRINGE
PREFILLED_SYRINGE | INTRAVENOUS | Status: DC | PRN
  Administered 2022-02-16: 40 ug via INTRAVENOUS

## 2022-02-16 MED ORDER — HYDROMORPHONE HCL 1 MG/ML IJ SOLN
INTRAMUSCULAR | Status: AC
  Filled 2022-02-16: qty 0.5

## 2022-02-16 MED ORDER — ONDANSETRON HCL 4 MG/2ML IJ SOLN
INTRAMUSCULAR | Status: AC
  Filled 2022-02-16: qty 2

## 2022-02-16 MED ORDER — OXYCODONE HCL 5 MG/5ML PO SOLN: 5.0000 mg | Freq: Once | ORAL | Status: DC | PRN

## 2022-02-16 MED ORDER — ORAL CARE MOUTH RINSE
15.0000 mL | Freq: Once | OROMUCOSAL | Status: AC
Start: 2022-02-16 — End: 2022-02-16

## 2022-02-16 MED ORDER — HYDROMORPHONE HCL 1 MG/ML IJ SOLN
INTRAMUSCULAR | Status: AC
  Filled 2022-02-16: qty 1

## 2022-02-16 MED ORDER — DIPHENHYDRAMINE HCL 50 MG/ML IJ SOLN
12.5000 mg | Freq: Four times a day (QID) | INTRAMUSCULAR | Status: DC | PRN
  Administered 2022-02-20: 12.5 mg via INTRAVENOUS
  Filled 2022-02-16: qty 1

## 2022-02-16 MED ORDER — LACTATED RINGERS IV SOLN: INTRAVENOUS | Status: DC

## 2022-02-16 MED ORDER — SUCCINYLCHOLINE CHLORIDE 200 MG/10ML IV SOSY
PREFILLED_SYRINGE | INTRAVENOUS | Status: AC
  Filled 2022-02-16: qty 10

## 2022-02-16 MED ORDER — FENTANYL CITRATE (PF) 250 MCG/5ML IJ SOLN
INTRAMUSCULAR | Status: AC
  Filled 2022-02-16: qty 5

## 2022-02-16 MED ORDER — SOD CITRATE-CITRIC ACID 500-334 MG/5ML PO SOLN
30.0000 mL | ORAL | Status: AC
  Administered 2022-02-16: 30 mL via ORAL
  Filled 2022-02-16: qty 30

## 2022-02-16 MED ORDER — HYDROMORPHONE HCL 1 MG/ML IJ SOLN
0.2500 mg | INTRAMUSCULAR | Status: DC | PRN
  Administered 2022-02-16 (×5): 0.25 mg via INTRAVENOUS

## 2022-02-16 MED ORDER — SUGAMMADEX SODIUM 200 MG/2ML IV SOLN
INTRAVENOUS | Status: DC | PRN
  Administered 2022-02-16: 200 mg via INTRAVENOUS

## 2022-02-16 MED ORDER — DEXAMETHASONE SODIUM PHOSPHATE 10 MG/ML IJ SOLN
INTRAMUSCULAR | Status: AC
  Filled 2022-02-16: qty 1

## 2022-02-16 MED ORDER — ONDANSETRON HCL 4 MG/2ML IJ SOLN: 4.0000 mg | Freq: Once | INTRAMUSCULAR | Status: DC | PRN

## 2022-02-16 MED ORDER — POVIDONE-IODINE 10 % EX SWAB
2.0000 | Freq: Once | CUTANEOUS | Status: AC
  Administered 2022-02-16: 2 via TOPICAL

## 2022-02-16 MED ORDER — LACTATED RINGERS IV SOLN
INTRAVENOUS | Status: DC
Start: 2022-02-16 — End: 2022-02-16

## 2022-02-16 MED ORDER — ONDANSETRON HCL 4 MG/2ML IJ SOLN: 4.0000 mg | Freq: Four times a day (QID) | INTRAMUSCULAR | Status: DC | PRN

## 2022-02-16 MED ORDER — KETOROLAC TROMETHAMINE 30 MG/ML IJ SOLN
INTRAMUSCULAR | Status: AC
  Filled 2022-02-16: qty 1

## 2022-02-16 MED ORDER — FENTANYL CITRATE (PF) 250 MCG/5ML IJ SOLN
INTRAMUSCULAR | Status: DC | PRN
  Administered 2022-02-16: 100 ug via INTRAVENOUS
  Administered 2022-02-16: 50 ug via INTRAVENOUS
  Administered 2022-02-16 (×2): 100 ug via INTRAVENOUS
  Administered 2022-02-16: 50 ug via INTRAVENOUS
  Administered 2022-02-16: 100 ug via INTRAVENOUS

## 2022-02-16 MED ORDER — KETAMINE HCL-SODIUM CHLORIDE 100-0.9 MG/10ML-% IV SOSY
PREFILLED_SYRINGE | INTRAVENOUS | Status: DC | PRN
  Administered 2022-02-16: 25 mg via INTRAVENOUS

## 2022-02-16 MED ORDER — LIDOCAINE 2% (20 MG/ML) 5 ML SYRINGE
INTRAMUSCULAR | Status: AC
  Filled 2022-02-16: qty 5

## 2022-02-16 MED ORDER — MIDAZOLAM HCL 2 MG/2ML IJ SOLN
INTRAMUSCULAR | Status: AC
  Filled 2022-02-16: qty 2

## 2022-02-16 MED ORDER — MENTHOL 3 MG MT LOZG
1.0000 | LOZENGE | OROMUCOSAL | Status: DC | PRN
  Administered 2022-02-21: 3 mg via ORAL
  Filled 2022-02-16: qty 9

## 2022-02-16 MED ORDER — ALBUMIN HUMAN 5 % IV SOLN: INTRAVENOUS | Status: DC | PRN

## 2022-02-16 MED ORDER — SUCCINYLCHOLINE CHLORIDE 200 MG/10ML IV SOSY
PREFILLED_SYRINGE | INTRAVENOUS | Status: DC | PRN
  Administered 2022-02-16: 120 mg via INTRAVENOUS

## 2022-02-16 MED ORDER — PROPOFOL 10 MG/ML IV BOLUS
INTRAVENOUS | Status: DC | PRN
  Administered 2022-02-16: 150 mg via INTRAVENOUS

## 2022-02-16 MED ORDER — MIDAZOLAM HCL 2 MG/2ML IJ SOLN
INTRAMUSCULAR | Status: DC | PRN
  Administered 2022-02-16: 2 mg via INTRAVENOUS

## 2022-02-16 MED ORDER — ROCURONIUM BROMIDE 10 MG/ML (PF) SYRINGE
PREFILLED_SYRINGE | INTRAVENOUS | Status: AC
  Filled 2022-02-16: qty 10

## 2022-02-16 MED ORDER — DEXAMETHASONE SODIUM PHOSPHATE 10 MG/ML IJ SOLN
INTRAMUSCULAR | Status: DC | PRN
  Administered 2022-02-16: 10 mg via INTRAVENOUS

## 2022-02-16 MED ORDER — CHLORHEXIDINE GLUCONATE 0.12 % MT SOLN
OROMUCOSAL | Status: AC
  Administered 2022-02-16: 15 mL via OROMUCOSAL
  Filled 2022-02-16: qty 15

## 2022-02-16 MED ORDER — ONDANSETRON HCL 4 MG PO TABS: 4.0000 mg | ORAL_TABLET | Freq: Four times a day (QID) | ORAL | Status: DC | PRN

## 2022-02-16 MED ORDER — BISACODYL 10 MG RE SUPP: 10.0000 mg | Freq: Every day | RECTAL | Status: DC | PRN

## 2022-02-16 MED ORDER — OXYCODONE HCL 5 MG PO TABS: 5.0000 mg | ORAL_TABLET | Freq: Once | ORAL | Status: DC | PRN

## 2022-02-16 MED ORDER — KETAMINE HCL 50 MG/5ML IJ SOSY
PREFILLED_SYRINGE | INTRAMUSCULAR | Status: AC
  Filled 2022-02-16: qty 5

## 2022-02-16 MED ORDER — TEMAZEPAM 15 MG PO CAPS: 15.0000 mg | ORAL_CAPSULE | Freq: Every evening | ORAL | Status: DC | PRN

## 2022-02-16 MED ORDER — DIPHENHYDRAMINE HCL 12.5 MG/5ML PO ELIX: 12.5000 mg | ORAL_SOLUTION | Freq: Four times a day (QID) | ORAL | Status: DC | PRN

## 2022-02-16 MED ORDER — CHLORHEXIDINE GLUCONATE CLOTH 2 % EX PADS: 6.0000 | MEDICATED_PAD | Freq: Every day | CUTANEOUS | Status: DC

## 2022-02-16 MED ORDER — ONDANSETRON HCL 4 MG/2ML IJ SOLN
INTRAMUSCULAR | Status: DC | PRN
  Administered 2022-02-16: 4 mg via INTRAVENOUS

## 2022-02-16 MED ORDER — PROPOFOL 10 MG/ML IV BOLUS
INTRAVENOUS | Status: AC
  Filled 2022-02-16: qty 20

## 2022-02-16 MED ORDER — ACETAMINOPHEN 10 MG/ML IV SOLN
INTRAVENOUS | Status: DC | PRN
  Administered 2022-02-16: 1000 mg via INTRAVENOUS

## 2022-02-16 MED ORDER — KETOROLAC TROMETHAMINE 30 MG/ML IJ SOLN
INTRAMUSCULAR | Status: DC | PRN
  Administered 2022-02-16: 30 mg via INTRAVENOUS

## 2022-02-16 MED ORDER — ACETAMINOPHEN 10 MG/ML IV SOLN
INTRAVENOUS | Status: AC
  Filled 2022-02-16: qty 100

## 2022-02-16 MED ORDER — ENOXAPARIN SODIUM 40 MG/0.4ML IJ SOSY
40.0000 mg | PREFILLED_SYRINGE | INTRAMUSCULAR | Status: DC
  Administered 2022-02-17 – 2022-02-28 (×10): 40 mg via SUBCUTANEOUS
  Filled 2022-02-16 (×12): qty 0.4

## 2022-02-16 MED ORDER — GABAPENTIN 300 MG PO CAPS
300.0000 mg | ORAL_CAPSULE | ORAL | Status: AC
  Administered 2022-02-16: 300 mg via ORAL
  Filled 2022-02-16: qty 1

## 2022-02-16 MED ORDER — BUPIVACAINE HCL (PF) 0.25 % IJ SOLN
INTRAMUSCULAR | Status: DC | PRN
  Administered 2022-02-16: 30 mL

## 2022-02-16 MED ORDER — DEXTROSE IN LACTATED RINGERS 5 % IV SOLN: INTRAVENOUS | Status: DC

## 2022-02-16 MED ORDER — GABAPENTIN 300 MG PO CAPS
300.0000 mg | ORAL_CAPSULE | Freq: Two times a day (BID) | ORAL | Status: DC
  Administered 2022-02-16 – 2022-02-18 (×4): 300 mg via ORAL
  Filled 2022-02-16 (×4): qty 1

## 2022-02-16 MED ORDER — ROCURONIUM BROMIDE 10 MG/ML (PF) SYRINGE
PREFILLED_SYRINGE | INTRAVENOUS | Status: DC | PRN
  Administered 2022-02-16: 50 mg via INTRAVENOUS
  Administered 2022-02-16: 60 mg via INTRAVENOUS
  Administered 2022-02-16: 50 mg via INTRAVENOUS

## 2022-02-16 SURGICAL SUPPLY — 44 items
BAG COUNTER SPONGE SURGICOUNT (BAG) ×3 IMPLANT
BAG SPNG CNTER NS LX DISP (BAG) ×1
BARRIER ADHS 3X4 INTERCEED (GAUZE/BANDAGES/DRESSINGS) IMPLANT
BIOPATCH RED 1 DISK 7.0 (GAUZE/BANDAGES/DRESSINGS) ×2 IMPLANT
BLADE SURG 10 STRL SS (BLADE) ×6 IMPLANT
BLADE SURG 15 STRL LF DISP TIS (BLADE) IMPLANT
BLADE SURG 15 STRL SS (BLADE) ×2
BNDG GAUZE DERMACEA FLUFF (GAUZE/BANDAGES/DRESSINGS) ×1
BNDG GAUZE DERMACEA FLUFF 4 (GAUZE/BANDAGES/DRESSINGS) IMPLANT
BNDG GZE DERMACEA 4 6PLY (GAUZE/BANDAGES/DRESSINGS) ×1
BRR ADH 4X3 ABS CNTRL BYND (GAUZE/BANDAGES/DRESSINGS)
CANISTER SUCT 3000ML PPV (MISCELLANEOUS) ×3 IMPLANT
DRAIN CHANNEL 19F RND (DRAIN) ×2 IMPLANT
DRAPE WARM FLUID 44X44 (DRAPES) ×1 IMPLANT
DRSG TEGADERM 4X4.75 (GAUZE/BANDAGES/DRESSINGS) ×1 IMPLANT
EVACUATOR SILICONE 100CC (DRAIN) ×2 IMPLANT
GAUZE 4X4 16PLY ~~LOC~~+RFID DBL (SPONGE) ×1 IMPLANT
GLOVE BIOGEL PI IND STRL 7.0 (GLOVE) ×4 IMPLANT
GLOVE BIOGEL PI INDICATOR 7.0 (GLOVE) ×2
GLOVE ECLIPSE 7.0 STRL STRAW (GLOVE) ×6 IMPLANT
GOWN STRL REUS W/ TWL LRG LVL3 (GOWN DISPOSABLE) ×6 IMPLANT
GOWN STRL REUS W/TWL LRG LVL3 (GOWN DISPOSABLE) ×6
HANDLE SUCTION POOLE (INSTRUMENTS) IMPLANT
KIT TURNOVER KIT B (KITS) ×3 IMPLANT
NEEDLE HYPO 22GX1.5 SAFETY (NEEDLE) ×3 IMPLANT
NS IRRIG 1000ML POUR BTL (IV SOLUTION) ×4 IMPLANT
PACK ABDOMINAL GYN (CUSTOM PROCEDURE TRAY) ×3 IMPLANT
PAD ABD 8X10 STRL (GAUZE/BANDAGES/DRESSINGS) ×1 IMPLANT
PAD OB MATERNITY 4.3X12.25 (PERSONAL CARE ITEMS) ×3 IMPLANT
RETAINER VISCERA MED (MISCELLANEOUS) ×1 IMPLANT
SPECIMEN JAR MEDIUM (MISCELLANEOUS) ×3 IMPLANT
SPONGE T-LAP 18X18 ~~LOC~~+RFID (SPONGE) ×6 IMPLANT
STAPLER VISISTAT 35W (STAPLE) ×3 IMPLANT
SUCTION POOLE HANDLE (INSTRUMENTS) ×2
SUT ETHILON 2 0 FS 18 (SUTURE) ×2 IMPLANT
SUT PDS AB 1 TP1 96 (SUTURE) ×2 IMPLANT
SUT SILK 3 0 SH CR/8 (SUTURE) ×2 IMPLANT
SUT VIC AB 1 CTX 36 (SUTURE) ×4
SUT VIC AB 1 CTX36XBRD ANBCTRL (SUTURE) ×4 IMPLANT
SWAB CULTURE ESWAB REG 1ML (MISCELLANEOUS) ×1 IMPLANT
SYR BULB IRRIG 60ML STRL (SYRINGE) ×1 IMPLANT
SYR CONTROL 10ML LL (SYRINGE) ×3 IMPLANT
TOWEL GREEN STERILE FF (TOWEL DISPOSABLE) ×6 IMPLANT
TRAY FOLEY W/BAG SLVR 14FR (SET/KITS/TRAYS/PACK) ×3 IMPLANT

## 2022-02-16 NOTE — Anesthesia Procedure Notes (Signed)
Procedure Name: Intubation Date/Time: 02/16/2022 11:04 AM  Performed by: Rosiland Oz, CRNAPre-anesthesia Checklist: Patient identified, Emergency Drugs available, Suction available, Patient being monitored and Timeout performed Patient Re-evaluated:Patient Re-evaluated prior to induction Oxygen Delivery Method: Circle system utilized Preoxygenation: Pre-oxygenation with 100% oxygen Induction Type: IV induction, Rapid sequence and Cricoid Pressure applied Laryngoscope Size: Miller and 3 Grade View: Grade I Tube type: Oral Tube size: 7.0 mm Number of attempts: 1 Airway Equipment and Method: Stylet Placement Confirmation: ETT inserted through vocal cords under direct vision, positive ETCO2 and breath sounds checked- equal and bilateral Secured at: 21 cm Tube secured with: Tape Dental Injury: Teeth and Oropharynx as per pre-operative assessment

## 2022-02-16 NOTE — Op Note (Signed)
  02/08/2022 - 02/16/2022  12:14 PM  PATIENT:  Paula Lucas  31 y.o. female  PRE-OPERATIVE DIAGNOSIS:  SEPSIS, PID  POST-OPERATIVE DIAGNOSIS:  SEPSIS, PID  PROCEDURE:  Procedure(s): LYSIS OF ADHESIONS REPAIR SMALL BOWEL SEROSA x 3 DRAINAGE OF INTRAABDOMINAL ABSCESS BY THE LIVER  SURGEON:  Violeta Gelinas, MD  ASSISTANTS: Tinnie Gens, MD; Myna Hidalgo, MD   ANESTHESIA:   general  EBL:  Total I/O In: 1250 [I.V.:1000; IV Piggyback:250] Out: 650 [Urine:500; Blood:150]  BLOOD ADMINISTERED:none  DRAINS: (2) Jackson-Pratt drain(s) with closed bulb suction in the 1 in the pelvis and I by the liver    SPECIMEN:  No Specimen  DISPOSITION OF SPECIMEN:  N/A  COUNTS:  YES  DICTATION: .Dragon Dictation Patient was in the operating room with Dr. Shawnie Pons undergoing exploratory laparotomy.  There were significant adhesions of the sigmoid colon and distal ileum to the pelvic inflammatory process.  I gently freed up these areas of bowel from her pelvic organs.  There were 3 small serosal injuries of the distal ileum which I closed with 3-0 silk.  There were no enterotomies.  Further exploration revealed the chronic inflammatory nature of the uterus and tubes precluded identification of anatomy much further.  There were a lot of adhesions of the cecum down to this area and I could not visualize her appendix either.  We were unable to clearly identify the ureters or the bladder as well.  Decision was made to leave drains in the pelvis to avoid injury due to the severe inflammation.  I swept some adhesions away from the surface of her liver gently and irrigated out some purulent material up and around her liver.  79 [place] JP drain was placed in that area and brought out through the right abdomen.  It was secured with nylon.  An additional JP drain was placed in the pelvis and secured in a similar fashion.  I reinspected the areas of bowel repair which appeared intact and viable.  Patient's abdomen was  thoroughly irrigated and I scrubbed out and Dr. Shawnie Pons was proceeding with closure. PATIENT DISPOSITION:   remains in OR with Dr. Shawnie Pons   Delay start of Pharmacological VTE agent (>24hrs) due to surgical blood loss or risk of bleeding:  no  Violeta Gelinas, MD, MPH, FACS Pager: [phone]  6/27/202312:14 PM

## 2022-02-16 NOTE — Op Note (Addendum)
Preoperative diagnosis: PID, abdominal abscesses  Pain postoperative diagnosis: Same  Procedure: Exam under anesthesia, Exploratory laparotomy, pelvic washout, over-sewing of the bowel, drain placement  Surgeon:Marzelle Rutten S  Assistant: Katha Hamming, DO Violeta Gelinas, MD - co-surgeon An experienced assistant was required given the standard of surgical care given the complexity of the case.  This assistant was needed for exposure, dissection, suctioning, retraction, instrument exchange, and for overall help during the procedure.  Anesthesia:  Gen. endotracheal tube-Finucane, Tennis Must, DO  Findings: dense adhesions of bowel to anterior abdominal wall, posterior uterus and adnexa. Unable to see/find round ligaments, tubes or ovaries  Estimated blood loss: 100 cc  Specimens: Cultures and washings  Disposition of specimen: Pathology and lab  Reason for Procedure: Paula Lucas is a 31 y.o., G2P0010 who presents with PID. On IV Abx and s/p IR drain placement x 2 with ongoing pain and no change in size of abscesses noted.  Procedure: Patient was taken to the OR. Timeout was performed. The patient received Many Abx and SCDs were in place. The patient was prepped and draped in the usual sterile fashion. A Foley catheter was placed inside her bladder. A vertical midline incision was made with a knife. Electrocautery was used to take this incision down to underlying fascia which was incised in the midline. The fascial incision was extented sharply.  The rectus divided and the peritoneal cavity entered sharply.  Washings done. General surgery was asked to scrub in. Bowel adhesions taken down bluntly with 3 serosal tears, repaired by them with silk suture. A phlegmon noted at the uterus was encountered. Cultures obtained.  Attempts to see adnexa or any normal anatomy were not successful. Attempts at further removal of organs could not be safely done as the anatomy was so distorted.  Copious amount of  irrigation used to irrigate the pelvis and the upper liver. Drains left in place.  The fascia/bulk closure done with a 1 looped PDS suture in a running fashion from 2 ends with the inferior end closing fascia only. Subcutaneous tissues injected with 30 cc of quarter percent Marcaine. The skin was left open with wet to dry dressings. All instrument, needle and lap counts correct x 2. The patient was awakened and taken to recovery room in stable condition.  Shelbie Proctor PrattMD 02/16/2022 12:27 PM

## 2022-02-16 NOTE — Progress Notes (Addendum)
Overnight: No acute events overnight   Subjective: Patient seen and evaluated bedside postoperatively after ex lap with washout and bowel adhesion lysis.  Patient reports significant pain and nausea.  She also complains of feeling hot.  She says that she is still passing flatus but has not had any bowel movements yet.  Objective:  Vital signs in last 24 hours: Vitals:   02/15/22 0417 02/15/22 0424 02/15/22 0827 02/15/22 1139  BP: 131/82  119/80 121/83  Pulse: (!) 110  (!) 107 (!) 102  Resp: 19     Temp: (!) 100.7 F (38.2 C)  99.8 F (37.7 C) 99.8 F (37.7 C)  TempSrc: Oral  Oral Oral  SpO2: (!) 83% 94%  97%  Weight:      Height:        Constitutional:      General: She is not in acute distress.  Resting in bed HENT:     Head: Normocephalic and atraumatic.  Cardiovascular:     Rate and Rhythm: Tachycardic    Heart sounds: Normal heart sounds.  Pulmonary:     Effort: Pulmonary effort is normal.  Abdominal:     Comments: Tenderness to palpation diffusely, tenderness left back Musculoskeletal:     Right lower leg: No edema.     Left lower leg: No edema.  Skin:    General: Skin is warm and dry.  Neurological:     General: No focal deficit present.     Mental Status: She is alert.     Motor: Motor function is intact.  Psychiatric:        Behavior: Behavior normal. Behavior is cooperative.        Cognition and Memory: Cognition normal.   Assessment/Plan:  Principal Problem:   Pelvic abscess  Active Problems:   Sepsis (HCC)   Left lower quadrant abdominal pain   MRSA (methicillin resistant staph aureus) culture positive   Hepatic abscess  Ms. Cheak is a 31 year old female with a past medical history of recent admission for tubo-ovarian abscess complicated by septic shock (02/04/22 - 02/07/22) with previous history of tubo-ovarian abscess 2/2 N. Gonorrhea in 2019 who presents to the ED with c/o nausea, vomiting and abdominal pain, currently admitted for multiple  pelvic abscesses.   Multiple pelvic abscesses Patient was taken to the OR today for ex lap where she underwent washout and lysis of adhesion.  Previous drains were left in place and 2 new JP drains were placed right side. Patient was evaluated in her room postoperatively, she reported significant pain and nausea but did endorse she was able to pass flatus.  Has not yet had a bowel movement.  Still tender to the touch with some guarding.  Will likely take some time for inflammation to resolve. - Heart rate was high normal at 96 during evaluation, but she was hemodynamically stable. - Oxygen saturation 88-90 during evaluation, but patient was not taking very deep breaths secondary to pain.  Encouraged her to continue to breathe deeply through nasal cannula with improvement.  Patient demonstrated understanding of how to properly use incentive spirometer at bedside. - We will continue to control her pain with regimen of Toradol 10 mg every 6 as needed, oxycodone every 4 as needed and Dilaudid 1 mg every 4 as needed - We will continue antibiotics vancomycin, Flagyl, Doxy, Rocephin for now.   Iron deficiency anemia Hemoglobin stable at 9.1; no active signs of bleeding.  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  She states that she does not want family to visit her because she does not want them to see her in her current state.  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  She is amenable to meeting with spiritual counselor. -Patient has met with chaplain and reports significant benefit from this. - She has been started on trazodone nightly to assist with mood and trouble sleeping.   Barriers to  Discharge: Medical stability  Adron Bene, MD 02/15/2022, 11:48 AM Pager: [phone] After 5pm on weekdays and 1pm on weekends: On Call pager [phone]

## 2022-02-17 ENCOUNTER — Inpatient Hospital Stay (HOSPITAL_COMMUNITY): Payer: Medicaid Other

## 2022-02-17 ENCOUNTER — Encounter (HOSPITAL_COMMUNITY): Payer: Self-pay | Admitting: Family Medicine

## 2022-02-17 DIAGNOSIS — Z8719 Personal history of other diseases of the digestive system: Secondary | ICD-10-CM

## 2022-02-17 LAB — VANCOMYCIN, PEAK: Vancomycin Pk: 24 ug/mL — ABNORMAL LOW (ref 30–40)

## 2022-02-17 LAB — BASIC METABOLIC PANEL
Anion gap: 11 (ref 5–15)
BUN: 5 mg/dL — ABNORMAL LOW (ref 6–20)
CO2: 27 mmol/L (ref 22–32)
Calcium: 8.6 mg/dL — ABNORMAL LOW (ref 8.9–10.3)
Chloride: 101 mmol/L (ref 98–111)
Creatinine, Ser: 0.7 mg/dL (ref 0.44–1.00)
GFR, Estimated: >60 mL/min (ref >60)
Glucose, Bld: 104 mg/dL — ABNORMAL HIGH (ref 70–99)
Potassium: 3.9 mmol/L (ref 3.5–5.1)
Sodium: 139 mmol/L (ref 135–145)

## 2022-02-17 LAB — CBC
HCT: 27.8 % — ABNORMAL LOW (ref 36.0–46.0)
Hemoglobin: 9.3 g/dL — ABNORMAL LOW (ref 12.0–15.0)
MCH: 29.3 pg (ref 26.0–34.0)
MCHC: 33.5 g/dL (ref 30.0–36.0)
MCV: 87.7 fL (ref 80.0–100.0)
Platelets: 713 10*3/uL — ABNORMAL HIGH (ref 150–400)
RBC: 3.17 MIL/uL — ABNORMAL LOW (ref 3.87–5.11)
RDW: 16.8 % — ABNORMAL HIGH (ref 11.5–15.5)
WBC: 17.1 10*3/uL — ABNORMAL HIGH (ref 4.0–10.5)
nRBC: 0 % (ref 0.0–0.2)

## 2022-02-17 LAB — VANCOMYCIN, TROUGH: Vancomycin Tr: 14 ug/mL — ABNORMAL LOW (ref 15–20)

## 2022-02-17 MED ORDER — LIP MEDEX EX OINT
TOPICAL_OINTMENT | CUTANEOUS | Status: DC | PRN
  Administered 2022-02-17: 75 via TOPICAL
  Filled 2022-02-17: qty 7

## 2022-02-17 MED ORDER — LORAZEPAM 2 MG/ML IJ SOLN
0.5000 mg | INTRAMUSCULAR | Status: AC | PRN
Start: 2022-02-17 — End: 2022-02-17
  Administered 2022-02-17: 0.5 mg via INTRAVENOUS
  Filled 2022-02-17: qty 1

## 2022-02-17 MED ORDER — FLUCONAZOLE 100 MG PO TABS
200.0000 mg | ORAL_TABLET | Freq: Every day | ORAL | Status: AC
  Administered 2022-02-17: 200 mg via ORAL
  Filled 2022-02-17: qty 2

## 2022-02-17 MED ORDER — OXYCODONE HCL ER 20 MG PO T12A
20.0000 mg | EXTENDED_RELEASE_TABLET | Freq: Two times a day (BID) | ORAL | Status: DC
  Administered 2022-02-17 – 2022-02-25 (×16): 20 mg via ORAL
  Filled 2022-02-17 (×18): qty 1

## 2022-02-17 MED ORDER — HYDROMORPHONE HCL 1 MG/ML IJ SOLN
1.0000 mg | Freq: Once | INTRAMUSCULAR | Status: AC
  Administered 2022-02-17: 1 mg via INTRAVENOUS
  Filled 2022-02-17: qty 1

## 2022-02-17 NOTE — Progress Notes (Signed)
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 gonorrheal infection (07/2021)  Repeat ct 6/19 --> right sided abd abscess improved in size; new pelvic abscess of 7 cm and 4cm S/p perc drain placement abdpelv fluid collection right side 6/15 S/p perc new drain placement right pelvic abscess, and exchange of preexisting right anterior abd/pelv drain on 6/21   ----- 6/28 assessment Agree with STD spectrum abx coverage (ceftriaxone/doxy) Would keep doxy for 2 weeks only though from starting date 6/19 S/p ex lap with I&D of abscess by general surgery and obgyn on 6/27 Some abscesses couldn't get to during surgery and severe inflammatory adhesions present, so still anticipating long course of abx     #mrsa bacteriuria #back pain No recent foley catheter Complained of back pain Bcx here on admission and repeat have been engative Will need to workup staph aureus in urine. Unless she has recent foley would attribute this to occult bacteremia. She had negative 6/15 and 6/19 bcx Lumbar mri no OM or soft tissue infection 6/23 tte no vegetation on valve  6/26 exam with rather high left sided para-thoraci tenderness  ------- 6/28 assessment Awaiting mri Continue vanc for now -- 2 weeks mrsa coverage if mri all negative       I spent more than 50 minute reviewing data/chart, and coordinating care and >50% direct face to face time providing  counseling/discussing diagnostics/treatment plan with patient    Principal Problem:   Pelvic abscess  Active Problems:   Sepsis (HCC)   Left lower quadrant abdominal pain   MRSA (methicillin resistant staph aureus) culture positive   Hepatic abscess   Allergies  Allergen Reactions   Fish Allergy Anaphylaxis    Severe allergic reaction to all seafoods   Motrin [Ibuprofen] Anaphylaxis    Pt tolerated Ketorolac 04/02/18   Mushroom Extract Complex Anaphylaxis   Shellfish-Derived Products Anaphylaxis   Tylenol [Acetaminophen] Anaphylaxis    Scheduled Meds:  docusate sodium  100 mg Oral BID   doxycycline  100 mg Oral Q12H   enoxaparin (LOVENOX) injection  40 mg Subcutaneous Q24H   feeding supplement  237 mL Oral BID BM   gabapentin  300 mg Oral BID   metroNIDAZOLE  500 mg Oral Q12H   multivitamin with minerals  1 tablet Oral Daily   nicotine  21 mg Transdermal Daily   polyethylene glycol  17 g Oral Daily   sodium chloride flush  3 mL Intravenous Q12H   sodium chloride flush  5 mL Intracatheter Q8H   traZODone  50 mg Oral QHS   Continuous Infusions:  cefTRIAXone (ROCEPHIN)  IV Stopped (02/16/22 1935)   dextrose 5% lactated ringers 125 mL/hr at 02/17/22 1141  vancomycin 1,000 mg (02/17/22 0831)   PRN Meds:.bisacodyl, diphenhydrAMINE **OR** diphenhydrAMINE, HYDROmorphone (DILAUDID) injection, hydrOXYzine, menthol-cetylpyridinium, ondansetron **OR** ondansetron (ZOFRAN) IV, oxyCODONE, polyvinyl alcohol, simethicone, sodium chloride flush, temazepam   SUBJECTIVE: S/p exlap 6/27 I reviewed operative note from obgyn/gen surg  Obgyn: Lysis of adhesion done -- 3 serosal tears repaired Phlegmon noted at uterus -- cx obtained Unsucessful to see adnexa due to distorted anatomy with infection Drains left in place Fascia closed; skin open  Gen Surg: "Chronic inflammatory nature of uterus and tubes precluded ID of anatomy." Ureters, bladder, and appendix couldn't be visualized  also due to adhesion Some purulent material around liver 19 french jp in that area and brought out through right abdomen An additional jp placed in pelvis  Areas of bowel repair appeared intact/viable      Review of Systems: ROS All other ROS was negative, except mentioned above     OBJECTIVE: Vitals:   02/16/22 2343 02/17/22 0000 02/17/22 0400 02/17/22 0734  BP: (!) 107/91 122/88 107/86 107/71  Pulse:    100  Resp: 20 20 20    Temp: 97.8 F (36.6 C)   98 F (36.7 C)  TempSrc: Oral   Oral  SpO2: 90% 91% 97%   Weight:      Height:       Body mass index is 30.03 kg/m.  Physical Exam General/constitutional: no distress, pleasant HEENT: Normocephalic, PER, Conj Clear, EOMI, Oropharynx clear Neck supple CV: rrr no mrg Lungs: clear to auscultation, normal respiratory effort Abd: dressing c/d; jp's with serosanguinous output minimal Ext: no edema Skin: No Rash Neuro: nonfocal MSK: no peripheral joint swelling/tenderness/warmth; didn't examine back today    Lab Results Lab Results  Component Value Date   WBC 17.1 (H) 02/17/2022   HGB 9.3 (L) 02/17/2022   HCT 27.8 (L) 02/17/2022   MCV 87.7 02/17/2022   PLT 713 (H) 02/17/2022    Lab Results  Component Value Date   CREATININE 0.70 02/17/2022   BUN 5 (L) 02/17/2022   NA 139 02/17/2022   K 3.9 02/17/2022   CL 101 02/17/2022   CO2 27 02/17/2022    Lab Results  Component Value Date   ALT 22 02/09/2022   AST 26 02/09/2022   ALKPHOS 48 02/09/2022   BILITOT 0.4 02/09/2022      Microbiology: Recent Results (from the past 240 hour(s))  Blood culture (routine x 2)     Status: None   Collection Time: 02/08/22  8:31 AM   Specimen: BLOOD RIGHT FOREARM  Result Value Ref Range Status   Specimen Description BLOOD RIGHT FOREARM  Final   Special Requests   Final    BOTTLES DRAWN AEROBIC AND ANAEROBIC Blood Culture adequate volume   Culture   Final    NO GROWTH 5 DAYS Performed at [facility], [address], [place], [place]    Report Status 02/13/2022 FINAL  Final  Blood culture (routine x 2)     Status: None   Collection Time: 02/08/22  8:31 AM   Specimen: Right Antecubital; Blood  Result Value Ref Range Status   Specimen Description RIGHT ANTECUBITAL  Final   Special Requests   Final    BOTTLES DRAWN AEROBIC AND ANAEROBIC Blood Culture adequate volume   Culture   Final    NO GROWTH 5 DAYS Performed at [facility], [address] [place], [place]    Report Status 02/13/2022 FINAL  Final  Aerobic/Anaerobic Culture w  Gram Stain (surgical/deep wound)     Status: None   Collection Time: 02/10/22  1:47 PM   Specimen: Abscess  Result Value Ref Range Status   Specimen Description ABSCESS  Final   Special Requests POST CT GUIDED RIGHT TRANSGLUTEAL DRAIN PLACEMENT  Final   Gram Stain   Final    FEW WBC PRESENT,BOTH PMN AND MONONUCLEAR NO ORGANISMS SEEN    Culture   Final    No growth aerobically or anaerobically. Performed at [facility], [address] [place], [place] [address]    Report Status 02/15/2022 FINAL  Final  Surgical pcr screen     Status: None   Collection Time: 02/16/22  8:04 AM   Specimen: Nasal Mucosa; Nasal Swab  Result Value Ref Range Status   MRSA, PCR NEGATIVE NEGATIVE Final   Staphylococcus aureus NEGATIVE NEGATIVE Final    Comment: (NOTE) The Xpert SA Assay (FDA approved for NASAL specimens in patients 58 years of age and older), is one component of a comprehensive surveillance program. It is not intended to diagnose infection nor to guide or monitor treatment. Performed at [facility], [address], [place], [place] [address]   Aerobic/Anaerobic Culture w Gram Stain (surgical/deep wound)     Status: None (Preliminary result)   Collection Time: 02/16/22 10:29 AM   Specimen: PATH Other; Body Fluid  Result Value Ref Range Status   Specimen Description WOUND  Final   Special Requests ABDOMINAL  Final   Gram  Stain NO WBC SEEN NO ORGANISMS SEEN   Final   Culture   Final    NO GROWTH < 24 HOURS Performed at [facility], [address] [place], [place] [address]    Report Status PENDING  Incomplete  Aerobic/Anaerobic Culture w Gram Stain (surgical/deep wound)     Status: None (Preliminary result)   Collection Time: 02/16/22 11:36 AM   Specimen: PATH Other; Body Fluid  Result Value Ref Range Status   Specimen Description WOUND  Final   Special Requests ABDOMINAL WASHING  Final   Gram Stain NO WBC SEEN NO ORGANISMS SEEN   Final   Culture   Final    NO GROWTH < 24 HOURS Performed at [facility], [address], [place], [place] [address]    Report Status PENDING  Incomplete     Serology:   Imaging: If present, new imagings (plain films, ct scans, and mri) have been personally visualized and interpreted; radiology reports have been reviewed. Decision making incorporated into the Impression / Recommendations.  6/25 ct abd pelv with contrast FINDINGS: Lower chest: Persistent moderate-sized pleural effusions with overlying atelectasis.   Hepatobiliary: Small residual lentiform rim enhancing abscess at the dome of the liver anteriorly measuring a maximum of 5.6 x 1.1 cm. This previously measured approximately 9.3 x 1.1 cm. Inferiorly there is a persistent perihepatic abscess. It measures approximately 9.5 x 3.1 cm and previously measured 10.1 x 3.7 cm. No worrisome hepatic lesions or intrahepatic biliary dilatation. The gallbladder is unremarkable. No common bile duct dilatation.   Pancreas: No mass, inflammation or ductal dilatation.   Spleen: Normal size.  No focal lesions.   Adrenals/Urinary Tract: Adrenal glands and kidneys are unremarkable and stable. The bladder is grossly normal.   Stomach/Bowel: The stomach, duodenum, small bowel and colon grossly normal.   Vascular/Lymphatic: The aorta and branch vessels are patent. The major venous structures are patent.  Stable borderline enlarged mesenteric and retroperitoneal lymph nodes,  likely reactive/inflammatory.   Reproductive: The uterus and ovaries are stable.   Other: Percutaneous drainage catheter in the upper right pelvis with a persistent rim enhancing abscess measuring approximately 8.8 x 3.5 cm and previously measuring 9.8 x 5.6 cm. A second drainage catheter in the lower right cul-de-sac area with a small persistent rim enhancing abscess measuring 4.1 x 2.0 cm. This previously measured 5.2 x 4.9 cm.   Left-sided rounded pelvic abscess measures 3.6 x 2.9 cm on image 71/3. This previously measured 3.5 x 2.7 cm.   Persistent diffuse mesenteric and subcutaneous inflammations/edema.   Musculoskeletal: No significant bony findings.   IMPRESSION: 1. Persistent moderate-sized pleural effusions with overlying atelectasis. 2. Persistent but slightly smaller undrained perihepatic abscesses. 3. Two right-sided pelvic drainage catheters with persistent but smaller surrounding abscesses. 4. Left-sided undrained pelvic abscess is slightly smaller also. 5. Persistent diffuse mesenteric and subcutaneous inflammation. 6. Stable borderline enlarged mesenteric and retroperitoneal lymph nodes, likely reactive/inflammatory.   6/23 mri lumbar spine 1. No MRI evidence for acute infection within the lumbar spine. 2. Left subarticular disc protrusion at L5-S1, contacting the descending left S1 nerve root in the left lateral recess. 3. Small left subarticular disc protrusion at L2-3 with borderline mild left lateral recess stenosis. 4. Multiloculated collections/abscesses within the partially visualized pelvis, better characterized on recent CT.   6/23 tte  1. Left ventricular ejection fraction, by estimation, is 60 to 65%. The left ventricle has normal function. The left ventricle has no regional wall motion abnormalities. Left ventricular diastolic parameters were normal.   2. Right ventricular systolic  function is normal. The right ventricular size is normal.   3. The mitral valve is normal in structure. Trivial mitral valve  regurgitation. No evidence of mitral stenosis.   4. The aortic valve is normal in structure. Aortic valve regurgitation is not visualized. No aortic stenosis is present.    Raymondo Band, MD [facility] [phone] pager    02/17/2022, 1:29 PM

## 2022-02-17 NOTE — Progress Notes (Signed)
Pharmacy Antibiotic Note  Paula Lucas is a 31 y.o. female admitted on 02/08/2022 with concern for MRSA bacteremia.  Pharmacy has been consulted for vancomycin dosing.  6/15 Ucx: MRSA (20,000 cfu) Renal function stable, Scr 0.64 mg/dL Now with concern for occult MRSA bacteremia  Vanc peak 24, vanc trough 14>>AUC 481. Continue with current dose  Plan: Vancomycin 1000mg  q12hr Continue ceftriaxone, metronidazole, and doxycycline per ID Repeat levels as needed   Height: 5\' 3"  (160 cm) Weight: 76.9 kg (169 lb 8.5 oz) IBW/kg (Calculated) : 52.4  Temp (24hrs), Avg:98.2 F (36.8 C), Min:97.8 F (36.6 C), Max:98.5 F (36.9 C)  Recent Labs  Lab 02/13/22 0351 02/14/22 0646 02/15/22 0203 02/16/22 0149 02/17/22 0555 02/17/22 1152 02/17/22 2010  WBC 14.0* 11.7* 12.2* 12.1* 17.1*  --   --   CREATININE 0.64 0.67 0.79 0.81 0.70  --   --   VANCOTROUGH  --   --   --   --   --   --  14*  VANCOPEAK  --   --   --   --   --  24*  --      Estimated Creatinine Clearance: 101.9 mL/min (by C-G formula based on SCr of 0.7 mg/dL).    Allergies  Allergen Reactions   Fish Allergy Anaphylaxis    Severe allergic reaction to all seafoods   Motrin [Ibuprofen] Anaphylaxis    Pt tolerated Ketorolac 04/02/18   Mushroom Extract Complex Anaphylaxis   Shellfish-Derived Products Anaphylaxis   Tylenol [Acetaminophen] Anaphylaxis   Cefepime 6/19>>6/20  CTX 6/20 >>  Doxy IV 6/19 >> PO 6/23 >>  Metronidazole 6/19>>  Vancomycin 6/24>>    6/19 blood: negative  6/15 urine: 20K/ml MRSA  6/16 pelvic floor abscess drain: negative  6/21 surgical/deep wound: no organisms on gram stain, final  7/16, PharmD, BCIDP, AAHIVP, CPP Infectious Disease Pharmacist 02/17/2022 9:08 PM

## 2022-02-17 NOTE — Progress Notes (Signed)
Overnight: No acute events overnight   Subjective: Patient seen and evaluated bedside. Significant pain last night. Received oral oxy and IV dilaudid. Patient upset that she has been getting IV medication while asleep. Patient tolerating PO intake today and reports getting more of an appetite. Reports feeling yeast infection.    Objective:  Vital signs in last 24 hours: Vitals:   02/17/22 0000 02/17/22 0400 02/17/22 0734 02/17/22 1629  BP: 122/88 107/86 107/71   Pulse:   100 100  Temp:   98 F (36.7 C) 98.5 F (36.9 C)  Resp: _0 Height:      Weight:      SpO2: 91% 97%  90%  TempSrc:   Oral Oral  BMI (Calculated):       Constitutional:      General: She is not in acute distress.  Resting in bed, appears more comfortable than yesterday. HENT:     Head: Normocephalic and atraumatic.  Cardiovascular:     Rate and Rhythm: Tachycardic    Heart sounds: Normal heart sounds.  Pulmonary:     Effort: Pulmonary effort is normal.  Abdominal:     Comments: Tenderness to palpation diffusely, midline surgical incision with dressing in place, no strikethrough, 2 JP drains right lower abdomen with serosanguineous output Genitourinary: - Patient reporting yeast infection, minimal/scant white discharge noted.   Musculoskeletal:     Right lower leg: No edema.     Left lower leg: No edema.  Skin:    General: Skin is warm and dry.  Neurological:     General: No focal deficit present.     Mental Status: She is alert.     Motor: Motor function is intact.  Psychiatric:        Behavior: Behavior normal. Behavior is cooperative.        Cognition and Memory: Cognition normal.   Assessment/Plan:  Principal Problem:   Pelvic abscess  Active Problems:   Sepsis (HCC)   Left lower quadrant abdominal pain   MRSA (methicillin resistant staph aureus) culture positive   Hepatic abscess  [name] is a 31 year old female with a past medical history of recent admission for tubo-ovarian  abscess complicated by septic shock (02/04/22 - 02/07/22) with previous history of tubo-ovarian abscess 2/2 N. Gonorrhea in 2019 who presents to the ED with c/o nausea, vomiting and abdominal pain, currently admitted for multiple pelvic abscesses.   Multiple pelvic abscesses Patient is 1 day postop status post ex lap with washout and bowel adhesion lysis.  3 serosal tears were repaired as well.  During evaluation today abdomen still appears distended, and is tender diffusely to palpation.  However patient overall appears to be more comfortable, she has been afebrile with stable blood pressure.  Midline surgical incision remains open with bandages in place and no strikethrough.  Ideally would have wound VAC placed, however patient refused this secondary to pain despite receiving pain medicine.  Overall appears to be improving and she reports that she has had increased appetite, still passing flatus, and pain is slightly improved. She was noted to have significant leukocytosis this morning 17.1, however this is likely related to stress response in the setting of recent surgery, as she has been otherwise afebrile and does appear to be improving symptomatically.   - We will continue to control her pain with regimen of Toradol 10 mg every 6 as needed.  We will switch her from oxycodone to OxyContin 20 mg twice daily, with Dilaudid IV  for breakthrough as patient voiced concern previously regarding desire to not become addicted to opioids. - We will continue antibiotics for now given abscesses that were not able to be reached during ex lap.  Complete source control. - Patient had previously reported back pain prior to undergoing ex lap.  Given her history of MRSA bacteriuria, and suspected occult bacteremia as well as her multiple pelvic abscesses, there is concern that she may have possibly seeded additional areas of infection.  We will further evaluate this new back pain with MRI as she tolerates.  Can give Ativan  prior to exam if necessary.  Candida vaginitis Patient reporting significant amount of white discharge that she states she cleaned herself last night.  She does endorse a history of prior yeast infections and says this feels similar.  Upon inspection today, only scant white discharge noted. Likely related to patient's ongoing antibiotic use. We have started the patient on Diflucan 200 mg daily.   Iron deficiency anemia Hemoglobin stable at 9.1; no active signs of bleeding.  We will hold off on iron supplementation in the setting of acute infection - Transfuse if hemoglobin is less than 7 - Continue to monitor for active signs of bleeding  Depression relating to medical illness Patient reporting she has been feeling down and depressed related to her infection, slow progress and length of stay in the hospital.  She states that she does not want family to visit her because she does not want them to see her in her current state.  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  She is amenable to meeting with spiritual counselor. -Patient has met with chaplain and reports significant benefit from this. - She has been started on trazodone nightly to assist with mood and trouble sleeping.   Barriers to Discharge: Medical stability  Paula Ruffini, MD 02/15/2022, 11:48 AM Pager: [phone] After 5pm on weekdays and 1pm on weekends: On Call pager [phone]

## 2022-02-17 NOTE — Consult Note (Signed)
WOC Nurse Consult Note: Patient receiving care in [facility] [phone] Reason for Consult: new vac placement Wound type: LQ abdominal surgical incision Measurement: 9 cm x 5.5 cm x 5.5 cm Patient is refusing the NPWT. MD's and bedside RN have been notified via [place]. Moistened saline gauze dressing replaced.   Please re-consult if patient agrees to therapy.  Renaldo Reel Katrinka Blazing, MSN, RN, CMSRN, Angus Seller, [facility] Wound Treatment Associate Pager [phone]

## 2022-02-17 NOTE — Progress Notes (Signed)
PT Cancellation Note  Patient Details Name: Paula Lucas MRN: [id number] DOB: Jan 19, 1991   Cancelled Treatment:    Reason Eval/Treat Not Completed: Other (comment).  Pt asks PT to let her rest today, including declining to do any bed exercises.  Discussed need for PT as physician has requested, and pt asks to have PT come back tomorrow.  Follow up as pt permits.   Ivar Drape 02/17/2022, 1:13 PM  Samul Dada, PT PhD Acute Rehab Dept. Number: [facility] [id number] and [facility] [phone]

## 2022-02-17 NOTE — Progress Notes (Signed)
This patient is alert and oriented,throughout today she declined a bath when offered after two times, she declined peri care. This patient also refused placement of wound vac dressing, despite being educated on it. Pain management continued, call bell is within patients reach.

## 2022-02-17 NOTE — Progress Notes (Signed)
Supervising Physician: Oley Balm  Patient Status:  Paula [facility] - In-pt  Chief Complaint:  Abdominal pain found to have a tubo ovarian placement. S/p Drain placement LLQ 12 Fr on 6.16.23 by Dr. Woody Seller and a second abscess drain into the RLQ abdominopelvic fluid collection on 6.21.23 10 Fr by Dr. Grace Isaac. S/P ex lap, abdominal washout with drainage of abscesses, repair SB serosa X 3 in conjunction with Dr. Shawnie Pons 6/27.   Subjective:  Pt refused to have drain check/flushed.  Allergies: Fish allergy, Motrin [ibuprofen], Mushroom extract complex, Shellfish-derived products, and Tylenol [acetaminophen]  Medications: Prior to Admission medications   Medication Sig Start Date End Date Taking? Authorizing Provider  doxycycline (VIBRAMYCIN) 100 MG capsule Take 1 capsule (100 mg total) by mouth 2 (two) times daily. Patient not taking: Reported on 02/04/2022 08/04/20   Liberty Handy, PA-C  hydrOXYzine (ATARAX/VISTARIL) 25 MG tablet Take 1 tablet (25 mg total) by mouth every 6 (six) hours. Patient not taking: Reported on 02/04/2022 08/04/20   Liberty Handy, PA-C  methylPREDNISolone (MEDROL DOSEPAK) 4 MG TBPK tablet Follow package insert Patient not taking: Reported on 02/04/2022 08/10/21   Tanda Rockers, PA-C  penicillin v potassium (VEETID) 500 MG tablet Take 1 tablet (500 mg total) by mouth 3 (three) times daily. Patient not taking: Reported on 02/04/2022 05/24/19   Mardella Layman, MD  permethrin (ELIMITE) 5 % cream Apply to affected area once Patient not taking: Reported on 02/04/2022 08/04/20   Liberty Handy, PA-C  traMADol (ULTRAM) 50 MG tablet Take 1 tablet (50 mg total) by mouth every 6 (six) hours as needed. Patient not taking: Reported on 02/04/2022 05/25/19   Eustace Moore, MD     Vital Signs: BP 107/71 (BP Location: Right Arm)   Pulse 100   Temp 98 F (36.7 C) (Oral)   Resp 20   Ht 5\' 3"  (1.6 m)   Wt 169 lb 8.5 oz (76.9 kg)   LMP 10/26/2021   SpO2 97%   BMI  30.03 kg/m   Physical Exam Constitutional:      General: She is not in acute distress.    Appearance: She is ill-appearing.  Pulmonary:     Effort: Pulmonary effort is normal. No respiratory distress.     Imaging: CT ABDOMEN PELVIS W CONTRAST  Result Date: 02/14/2022 CLINICAL DATA:  Follow-up intra-abdominal abscesses. EXAM: CT ABDOMEN AND PELVIS WITH CONTRAST TECHNIQUE: Multidetector CT imaging of the abdomen and pelvis was performed using the standard protocol following bolus administration of intravenous contrast. RADIATION DOSE REDUCTION: This exam was performed according to the departmental dose-optimization program which includes automated exposure control, adjustment of the mA and/or kV according to patient size and/or use of iterative reconstruction technique. CONTRAST:  02/16/2022 OMNIPAQUE IOHEXOL 300 MG/ML  SOLN COMPARISON:  CT scan 02/08/2022 FINDINGS: Lower chest: Persistent moderate-sized pleural effusions with overlying atelectasis. Hepatobiliary: Small residual lentiform rim enhancing abscess at the dome of the liver anteriorly measuring a maximum of 5.6 x 1.1 cm. This previously measured approximately 9.3 x 1.1 cm. Inferiorly there is a persistent perihepatic abscess. It measures approximately 9.5 x 3.1 cm and previously measured 10.1 x 3.7 cm. No worrisome hepatic lesions or intrahepatic biliary dilatation. The gallbladder is unremarkable. No common bile duct dilatation. Pancreas: No mass, inflammation or ductal dilatation. Spleen: Normal size.  No focal lesions. Adrenals/Urinary Tract: Adrenal glands and kidneys are unremarkable and stable. The bladder is grossly normal. Stomach/Bowel: The stomach, duodenum, small bowel and colon grossly normal. Vascular/Lymphatic:  The aorta and branch vessels are patent. The major venous structures are patent. Stable borderline enlarged mesenteric and retroperitoneal lymph nodes, likely reactive/inflammatory. Reproductive: The uterus and ovaries are  stable. Other: Percutaneous drainage catheter in the upper right pelvis with a persistent rim enhancing abscess measuring approximately 8.8 x 3.5 cm and previously measuring 9.8 x 5.6 cm. A second drainage catheter in the lower right cul-de-sac area with a small persistent rim enhancing abscess measuring 4.1 x 2.0 cm. This previously measured 5.2 x 4.9 cm. Left-sided rounded pelvic abscess measures 3.6 x 2.9 cm on image 71/3. This previously measured 3.5 x 2.7 cm. Persistent diffuse mesenteric and subcutaneous inflammations/edema. Musculoskeletal: No significant bony findings. IMPRESSION: 1. Persistent moderate-sized pleural effusions with overlying atelectasis. 2. Persistent but slightly smaller undrained perihepatic abscesses. 3. Two right-sided pelvic drainage catheters with persistent but smaller surrounding abscesses. 4. Left-sided undrained pelvic abscess is slightly smaller also. 5. Persistent diffuse mesenteric and subcutaneous inflammation. 6. Stable borderline enlarged mesenteric and retroperitoneal lymph nodes, likely reactive/inflammatory. Electronically Signed   By: Rudie Meyer M.D.   On: 02/14/2022 14:46    Labs:  CBC: Recent Labs    02/14/22 0646 02/15/22 0203 02/16/22 0149 02/17/22 0555  WBC 11.7* 12.2* 12.1* 17.1*  HGB 8.5* 9.1* 8.2* 9.3*  HCT 26.1* 29.1* 25.2* 27.8*  PLT 554* 646* 597* 713*    COAGS: Recent Labs    02/04/22 0901  INR 1.2  APTT 25    BMP: Recent Labs    02/14/22 0646 02/15/22 0203 02/16/22 0149 02/17/22 0555  NA 137 137 137 139  K 3.6 3.3* 3.5 3.9  CL 103 102 101 101  CO2 25 27 26 27   GLUCOSE 99 88 84 104*  BUN 5* <5* <5* 5*  CALCIUM 8.0* 8.3* 8.2* 8.6*  CREATININE 0.67 0.79 0.81 0.70  GFRNONAA >60 >60 >60 >60    LIVER FUNCTION TESTS: Recent Labs    02/04/22 0901 02/05/22 0347 02/08/22 0106 02/09/22 0308  BILITOT 1.8* 1.2 0.7 0.4  AST 60* 28 32 26  ALT 78* 48* 30 22  ALKPHOS 90 74 71 48  PROT 7.8 6.6 7.1 5.7*  ALBUMIN 2.7*  2.0* 2.0* 1.6*    Assessment and Plan:  ~25 cc serosanguineous OP to both JP drains noted to be lying on bed beside pt.  Pt refused to have sites checked, drains flushed.  WBC 17.1 (12.1) Tmax 98.0  Electronically Signed: 02/11/22, NP 02/17/2022, 3:40 PM   I spent a total of 15 Minutes at the the patient's bedside AND on the patient's hospital floor or unit, greater than 50% of which was counseling/coordinating care for abdominal drains.

## 2022-02-17 NOTE — Progress Notes (Signed)
Patient ID: Paula Lucas, female   DOB: 30-Oct-1990, 31 y.o.   MRN: [id number] 1 Day Post-Op    Subjective: Very sore ROS negative except as listed above. Objective: Vital signs in last 24 hours: Temp:  [97.6 F (36.4 C)-99.7 F (37.6 C)] 98 F (36.7 C) (06/28 0734) Pulse Rate:  [78-115] 100 (06/28 0734) Resp:  [14-23] 20 (06/28 0400) BP: (107-131)/(71-97) 107/71 (06/28 0734) SpO2:  [87 %-97 %] 97 % (06/28 0400) Weight:  [76.9 kg] 76.9 kg (06/27 1003) Last BM Date : 02/12/22  Intake/Output from previous day: 06/27 0701 - 06/28 0700 In: 3906.6 [P.O.:240; I.V.:2842.8; IV Piggyback:813.7] Out: 2305 [Urine:2050; Drains:105; Blood:150] Intake/Output this shift: No intake/output data recorded.  GI: soft, actually less tender overall but very sore at incision  Lab Results: CBC  Recent Labs    02/16/22 0149 02/17/22 0555  WBC 12.1* 17.1*  HGB 8.2* 9.3*  HCT 25.2* 27.8*  PLT 597* 713*   BMET Recent Labs    02/16/22 0149 02/17/22 0555  NA 137 139  K 3.5 3.9  CL 101 101  CO2 26 27  GLUCOSE 84 104*  BUN <5* 5*  CREATININE 0.81 0.70  CALCIUM 8.2* 8.6*   PT/INR No results for input(s): "LABPROT", "INR" in the last 72 hours. ABG No results for input(s): "PHART", "HCO3" in the last 72 hours.  Invalid input(s): "PCO2", "PO2"  Studies/Results: No results found.  Anti-infectives: Anti-infectives (From admission, onward)    Start     Dose/Rate Route Frequency Ordered Stop   02/13/22 2200  vancomycin (VANCOCIN) IVPB 1000 mg/200 mL premix        1,000 mg 200 mL/hr over 60 Minutes Intravenous Every 12 hours 02/13/22 0817     02/13/22 0900  vancomycin (VANCOREADY) IVPB 1750 mg/350 mL        1,750 mg 175 mL/hr over 120 Minutes Intravenous  Once 02/13/22 0809 02/13/22 1258   02/12/22 1315  doxycycline (VIBRA-TABS) tablet 100 mg        100 mg Oral Every 12 hours 02/12/22 1220     02/12/22 1000  doxycycline (VIBRA-TABS) tablet 100 mg  Status:  Discontinued        100  mg Oral Daily 02/12/22 0804 02/12/22 1219   02/11/22 1345  doxycycline (VIBRA-TABS) tablet 100 mg        100 mg Oral  Once 02/11/22 1255 02/11/22 1346   02/11/22 1345  metroNIDAZOLE (FLAGYL) tablet 500 mg        500 mg Oral Every 12 hours 02/11/22 1255     02/09/22 2051  metroNIDAZOLE (FLAGYL) IVPB 500 mg  Status:  Discontinued        500 mg 100 mL/hr over 60 Minutes Intravenous Every 12 hours 02/09/22 1106 02/11/22 1255   02/09/22 1600  cefTRIAXone (ROCEPHIN) 2 g in sodium chloride 0.9 % 100 mL IVPB        2 g 200 mL/hr over 30 Minutes Intravenous Every 24 hours 02/09/22 1147     02/08/22 1700  metroNIDAZOLE (FLAGYL) IVPB 500 mg  Status:  Discontinued        500 mg 100 mL/hr over 60 Minutes Intravenous Every 8 hours 02/08/22 1205 02/09/22 1106   02/08/22 1630  ceFEPIme (MAXIPIME) 2 g in sodium chloride 0.9 % 100 mL IVPB  Status:  Discontinued        2 g 200 mL/hr over 30 Minutes Intravenous Every 8 hours 02/08/22 0822 02/09/22 1147   02/08/22 0900  doxycycline (VIBRAMYCIN) 100  mg in sodium chloride 0.9 % 250 mL IVPB  Status:  Discontinued        100 mg 125 mL/hr over 120 Minutes Intravenous 2 times daily 02/08/22 0811 02/11/22 1255   02/08/22 0815  ceFEPIme (MAXIPIME) 2 g in sodium chloride 0.9 % 100 mL IVPB        2 g 200 mL/hr over 30 Minutes Intravenous  Once 02/08/22 0811 02/08/22 0855   02/08/22 0815  metroNIDAZOLE (FLAGYL) IVPB 500 mg        500 mg 100 mL/hr over 60 Minutes Intravenous  Once 02/08/22 0811 02/08/22 1147       Assessment/Plan: PID Multiple intraabdominal abscesses - S/P ex lap, abdominal washout with drainage of abscesses, repair SB serosa X 3 in conjunction with Dr. Shawnie Pons 6/27. JPs are serosanguinous   ID - cultures taken in OR, currently rocephin/ flagyl/ doxycycline/ vancomycin VTE - lovenox FEN - IVF, NPO Foley - none   Depression Iron deficiency anemia Obesity BMI 30.03  LOS: 8 days    Violeta Gelinas, MD, MPH, FACS Trauma & General  Surgery Use [email or web address] to contact on call provider  02/17/2022

## 2022-02-17 NOTE — Progress Notes (Signed)
Patient ID: Paula Lucas, female   DOB: 1990/09/01, 31 y.o.   MRN: [id number]   Assessment/Plan: Principal Problem:   Pelvic abscess  Active Problems:   Sepsis (HCC)   Left lower quadrant abdominal pain   MRSA (methicillin resistant staph aureus) culture positive   Hepatic abscess  Pelvic abscess/ Hepatic abscess POD1 s/p exlap, pelvic washout, LOA, oversew of bowel serosa, placement of 2 JP drains Patient has multiple abscesses in her abdomen and pelvis related to prior PID with current GC and Chlamydia negative. Patient has had IR drains placed x2 She had extensive surgery. Would advise PCA while minimal PO intake. Reviewed notes that o/n preferred IV medication. If change in plan prefers PCA, we can place the order.  Expect initial increase in inflammatory reaction due to the surgery I.e. WBC and plts increased Continue Vancomycin, Rocephin, Doxycycline, Flagyl--appreciate ID input   Sepsis (HCC) is not currently actively septic however surgical exploration does certainly increase this possibility. Will need to be monitored closely initially although first 24 hours she is afebrile and VSS/normal.    MRSA (methicillin resistant staph aureus) culture positive On vancomycin   Subjective: Interval History: significant pain. Minimal PO intake.   Objective: Vital signs in last 24 hours: Temp:  [97.6 F (36.4 C)-99.7 F (37.6 C)] 97.8 F (36.6 C) (06/27 2343) Pulse Rate:  [78-115] 78 (06/27 1939) Resp:  [14-23] 20 (06/28 0400) BP: (107-131)/(77-97) 107/86 (06/28 0400) SpO2:  [87 %-97 %] 97 % (06/28 0400) Weight:  [76.9 kg] 76.9 kg (06/27 1003)  Intake/Output from previous day: 06/27 0701 - 06/28 0700 In: 3906.6 [P.O.:240; I.V.:2842.8] Out: 2305 [Urine:2050; Drains:105]  Output by Drain (mL) 02/15/22 0701 - 02/15/22 1900 02/15/22 1901 - 02/16/22 0700 02/16/22 0701 - 02/16/22 1900 02/16/22 1901 - 02/17/22 0700 02/17/22 0701 - 02/17/22 0726  Closed System Drain 1 Right RLQ  Bulb (JP) 19 Fr.   25 40   Closed System Drain 2 Right Abdomen Bulb (JP) 19 Fr.   25 15      General appearance: alert, cooperative, and appears stated age Head: Normocephalic, without obvious abnormality, atraumatic Lungs: Normal effort Heart: regular rhythm and rate Abdomen: Diffusely distended, diffusely tender with rebound, JP drains and IR drains in place, incision dressing c/d/i Extremities: Homans sign is negative, no sign of DVT Skin: Skin color, texture, turgor normal. No rashes or lesions Neurologic: Grossly normal  Results for orders placed or performed during the hospital encounter of 02/08/22 (from the past 24 hour(s))  Surgical pcr screen     Status: None   Collection Time: 02/16/22  8:04 AM   Specimen: Nasal Mucosa; Nasal Swab  Result Value Ref Range   MRSA, PCR NEGATIVE NEGATIVE   Staphylococcus aureus NEGATIVE NEGATIVE  Aerobic/Anaerobic Culture w Gram Stain (surgical/deep wound)     Status: None (Preliminary result)   Collection Time: 02/16/22 10:29 AM   Specimen: PATH Other; Body Fluid  Result Value Ref Range   Specimen Description WOUND    Special Requests ABDOMINAL    Gram Stain      NO WBC SEEN NO ORGANISMS SEEN Performed at [facility], [address], [place], [place] [address]    Culture PENDING    Report Status PENDING   Aerobic/Anaerobic Culture w Gram Stain (surgical/deep wound)     Status: None (Preliminary result)   Collection Time: 02/16/22 11:36 AM   Specimen: PATH Other; Body Fluid  Result Value Ref Range   Specimen Description WOUND  Special Requests ABDOMINAL WASHING    Gram Stain      NO WBC SEEN NO ORGANISMS SEEN Performed at [facility], [address] [place], [place] [address]    Culture PENDING    Report Status PENDING   CBC     Status: Abnormal   Collection Time: 02/17/22  5:55 AM  Result Value Ref Range   WBC 17.1 (H) 4.0 - 10.5 K/uL   RBC 3.17 (L) 3.87 - 5.11 MIL/uL   Hemoglobin 9.3 (L) 12.0 - 15.0 g/dL    HCT 60.427.8 (L) 54.036.0 - 46.0 %   MCV 87.7 80.0 - 100.0 fL   MCH 29.3 26.0 - 34.0 pg   MCHC 33.5 30.0 - 36.0 g/dL   RDW 98.116.8 (H) 19.111.5 - 47.815.5 %   Platelets 713 (H) 150 - 400 K/uL   nRBC 0.0 0.0 - 0.2 %  Basic metabolic panel     Status: Abnormal   Collection Time: 02/17/22  5:55 AM  Result Value Ref Range   Sodium 139 135 - 145 mmol/L   Potassium 3.9 3.5 - 5.1 mmol/L   Chloride 101 98 - 111 mmol/L   CO2 27 22 - 32 mmol/L   Glucose, Bld 104 (H) 70 - 99 mg/dL   BUN 5 (L) 6 - 20 mg/dL   Creatinine, Ser 2.950.70 0.44 - 1.00 mg/dL   Calcium 8.6 (L) 8.9 - 10.3 mg/dL   GFR, Estimated >62>60 >13>60 mL/min   Anion gap 11 5 - 15    Studies/Results: CT ABDOMEN PELVIS W CONTRAST  Result Date: 02/14/2022 CLINICAL DATA:  Follow-up intra-abdominal abscesses. EXAM: CT ABDOMEN AND PELVIS WITH CONTRAST TECHNIQUE: Multidetector CT imaging of the abdomen and pelvis was performed using the standard protocol following bolus administration of intravenous contrast. RADIATION DOSE REDUCTION: This exam was performed according to the departmental dose-optimization program which includes automated exposure control, adjustment of the mA and/or kV according to patient size and/or use of iterative reconstruction technique. CONTRAST:  100mL OMNIPAQUE IOHEXOL 300 MG/ML  SOLN COMPARISON:  CT scan 02/08/2022 FINDINGS: Lower chest: Persistent moderate-sized pleural effusions with overlying atelectasis. Hepatobiliary: Small residual lentiform rim enhancing abscess at the dome of the liver anteriorly measuring a maximum of 5.6 x 1.1 cm. This previously measured approximately 9.3 x 1.1 cm. Inferiorly there is a persistent perihepatic abscess. It measures approximately 9.5 x 3.1 cm and previously measured 10.1 x 3.7 cm. No worrisome hepatic lesions or intrahepatic biliary dilatation. The gallbladder is unremarkable. No common bile duct dilatation. Pancreas: No mass, inflammation or ductal dilatation. Spleen: Normal size.  No focal lesions.  Adrenals/Urinary Tract: Adrenal glands and kidneys are unremarkable and stable. The bladder is grossly normal. Stomach/Bowel: The stomach, duodenum, small bowel and colon grossly normal. Vascular/Lymphatic: The aorta and branch vessels are patent. The major venous structures are patent. Stable borderline enlarged mesenteric and retroperitoneal lymph nodes, likely reactive/inflammatory. Reproductive: The uterus and ovaries are stable. Other: Percutaneous drainage catheter in the upper right pelvis with a persistent rim enhancing abscess measuring approximately 8.8 x 3.5 cm and previously measuring 9.8 x 5.6 cm. A second drainage catheter in the lower right cul-de-sac area with a small persistent rim enhancing abscess measuring 4.1 x 2.0 cm. This previously measured 5.2 x 4.9 cm. Left-sided rounded pelvic abscess measures 3.6 x 2.9 cm on image 71/3. This previously measured 3.5 x 2.7 cm. Persistent diffuse mesenteric and subcutaneous inflammations/edema. Musculoskeletal: No significant bony findings. IMPRESSION: 1. Persistent moderate-sized pleural effusions with overlying atelectasis.  2. Persistent but slightly smaller undrained perihepatic abscesses. 3. Two right-sided pelvic drainage catheters with persistent but smaller surrounding abscesses. 4. Left-sided undrained pelvic abscess is slightly smaller also. 5. Persistent diffuse mesenteric and subcutaneous inflammation. 6. Stable borderline enlarged mesenteric and retroperitoneal lymph nodes, likely reactive/inflammatory. Electronically Signed   By: Rudie Meyer M.D.   On: 02/14/2022 14:46   MR Lumbar Spine W Wo Contrast  Result Date: 02/13/2022 CLINICAL DATA:  Initial evaluation for back pain, history of MRSA bacteremia. EXAM: MRI LUMBAR SPINE WITHOUT AND WITH CONTRAST TECHNIQUE: Multiplanar and multiecho pulse sequences of the lumbar spine were obtained without and with intravenous contrast. CONTRAST:  7.17mL GADAVIST GADOBUTROL 1 MMOL/ML IV SOLN COMPARISON:   Prior CT from 02/08/2022. FINDINGS: Segmentation: Standard. Lowest well-formed disc space labeled the L5-S1 level. Alignment: Mild levoscoliosis with straightening of the normal lumbar lordosis. No listhesis. Vertebrae: Vertebral body height maintained without acute or chronic fracture. Bone marrow signal intensity diffusely decreased on T1 weighted sequence, nonspecific, but most commonly related to anemia, smoking or obesity. No discrete or worrisome osseous lesions. No evidence for osteomyelitis discitis or septic arthritis within the lumbar spine. Conus medullaris and cauda equina: Conus extends to the L1-2 level. Conus and cauda equina appear normal. Paraspinal and other soft tissues: Paraspinous soft tissues demonstrate no acute finding. Multi loculated collections/abscesses with surrounding enhancement partially seen within the partially visualized pelvis, better characterized on recent CT. Remainder of the visualized visceral structures otherwise unremarkable. Disc levels: L1-2:  Unremarkable. L2-3: Small left subarticular disc protrusion mildly indents the left ventral thecal sac (series 12, image 16). Borderline mild narrowing of the left lateral recess. Central canal remains widely patent. No foraminal stenosis. L3-4:  Unremarkable. L4-5:  Unremarkable. L5-S1: Left subarticular disc protrusion extends into the left lateral recess, contacting the descending left S1 nerve root (series 12, image 34). Resultant mild left lateral recess stenosis. Central canal remains patent. No significant foraminal stenosis. IMPRESSION: 1. No MRI evidence for acute infection within the lumbar spine. 2. Left subarticular disc protrusion at L5-S1, contacting the descending left S1 nerve root in the left lateral recess. 3. Small left subarticular disc protrusion at L2-3 with borderline mild left lateral recess stenosis. 4. Multiloculated collections/abscesses within the partially visualized pelvis, better characterized on  recent CT. Electronically Signed   By: Rise Mu M.D.   On: 02/13/2022 04:04   ECHOCARDIOGRAM COMPLETE  Result Date: 02/12/2022    ECHOCARDIOGRAM REPORT   Patient Name:   Paula Lucas Date of Exam: 02/12/2022 Medical Rec #:  [id number]        Height:       63.0 in Accession #:    [id number]       Weight:       169.5 lb Date of Birth:  July 20, 1991        BSA:          1.802 m Patient Age:    31 years         BP:           113/87 mmHg Patient Gender: F                HR:           98 bpm. Exam Location:  Inpatient Procedure: 2D Echo, Cardiac Doppler and Color Doppler Indications:   Staph aureus infection  History:       Patient has no prior history of Echocardiogram examinations.  Sepsis.  Sonographer:   Ross Ludwig RDCS (AE) Referring      TRUNG T VU Phys: IMPRESSIONS  1. Left ventricular ejection fraction, by estimation, is 60 to 65%. The left ventricle has normal function. The left ventricle has no regional wall motion abnormalities. Left ventricular diastolic parameters were normal.  2. Right ventricular systolic function is normal. The right ventricular size is normal.  3. The mitral valve is normal in structure. Trivial mitral valve regurgitation. No evidence of mitral stenosis.  4. The aortic valve is normal in structure. Aortic valve regurgitation is not visualized. No aortic stenosis is present. FINDINGS  Left Ventricle: Left ventricular ejection fraction, by estimation, is 60 to 65%. The left ventricle has normal function. The left ventricle has no regional wall motion abnormalities. The left ventricular internal cavity size was normal in size. There is  no left ventricular hypertrophy. Left ventricular diastolic parameters were normal. Right Ventricle: The right ventricular size is normal. Right vetricular wall thickness was not well visualized. Right ventricular systolic function is normal. Left Atrium: Left atrial size was normal in size. Right Atrium: Right atrial size was  normal in size. Pericardium: There is no evidence of pericardial effusion. Mitral Valve: The mitral valve is normal in structure. Trivial mitral valve regurgitation. No evidence of mitral valve stenosis. Tricuspid Valve: The tricuspid valve is normal in structure. Tricuspid valve regurgitation is trivial. Aortic Valve: The aortic valve is normal in structure. Aortic valve regurgitation is not visualized. No aortic stenosis is present. Aortic valve mean gradient measures 4.0 mmHg. Aortic valve peak gradient measures 7.1 mmHg. Aortic valve area, by VTI measures 2.25 cm. Pulmonic Valve: The pulmonic valve was normal in structure. Pulmonic valve regurgitation is not visualized. Aorta: The aortic root and ascending aorta are structurally normal, with no evidence of dilitation. IAS/Shunts: The atrial septum is grossly normal.  LEFT VENTRICLE PLAX 2D LVIDd:         4.80 cm LVIDs:         3.40 cm LV PW:         0.90 cm LV IVS:        1.10 cm LVOT diam:     1.90 cm LV SV:         49 LV SV Index:   27 LVOT Area:     2.84 cm  RIGHT VENTRICLE             IVC RV Basal diam:  2.30 cm     IVC diam: 1.50 cm RV S prime:     12.40 cm/s TAPSE (M-mode): 2.1 cm LEFT ATRIUM             Index        RIGHT ATRIUM          Index LA diam:        3.80 cm 2.11 cm/m   RA Area:     9.13 cm LA Vol (A2C):   26.4 ml 14.65 ml/m  RA Volume:   15.80 ml 8.77 ml/m LA Vol (A4C):   29.7 ml 16.48 ml/m LA Biplane Vol: 29.3 ml 16.26 ml/m  AORTIC VALVE AV Area (Vmax):    2.24 cm AV Area (Vmean):   2.21 cm AV Area (VTI):     2.25 cm AV Vmax:           133.00 cm/s AV Vmean:          87.900 cm/s AV VTI:  0.218 m AV Peak Grad:      7.1 mmHg AV Mean Grad:      4.0 mmHg LVOT Vmax:         105.00 cm/s LVOT Vmean:        68.600 cm/s LVOT VTI:          0.173 m LVOT/AV VTI ratio: 0.79  AORTA Ao Root diam: 2.90 cm Ao Asc diam:  2.70 cm  SHUNTS Systemic VTI:  0.17 m Systemic Diam: 1.90 cm Kristeen Miss MD Electronically signed by Kristeen Miss MD  Signature Date/Time: 02/12/2022/4:39:15 PM    Final    VAS [place] LOWER EXTREMITY VENOUS (DVT)  Result Date: 02/12/2022  Lower Venous DVT Study Patient Name:  Paula Lucas Haig  Date of Exam:   02/12/2022 Medical Rec #: [id number]         Accession #:    [id number] Date of Birth: 1990/10/29         Patient Gender: F Patient Age:   65 years Exam Location:  [facility] Procedure:      VAS [place] LOWER EXTREMITY VENOUS (DVT) Referring Phys: JULIE MACHEN --------------------------------------------------------------------------------  Indications: Pain.  Risk Factors: None identified. Limitations: Poor patient cooperation, patient pain tolerance. Comparison Study: No prior studies. Performing Technologist: Chanda Busing RVT  Examination Guidelines: A complete evaluation includes B-mode imaging, spectral Doppler, color Doppler, and power Doppler as needed of all accessible portions of each vessel. Bilateral testing is considered an integral part of a complete examination. Limited examinations for reoccurring indications may be performed as noted. The reflux portion of the exam is performed with the patient in reverse Trendelenburg.  +---------+---------------+---------+-----------+----------+--------------+ RIGHT    CompressibilityPhasicitySpontaneityPropertiesThrombus Aging +---------+---------------+---------+-----------+----------+--------------+ CFV      Full           Yes      Yes                                 +---------+---------------+---------+-----------+----------+--------------+ SFJ      Full                                                        +---------+---------------+---------+-----------+----------+--------------+ FV Prox  Full                                                        +---------+---------------+---------+-----------+----------+--------------+ FV Mid   Full                                                         +---------+---------------+---------+-----------+----------+--------------+ FV DistalFull                                                        +---------+---------------+---------+-----------+----------+--------------+ PFV      Full                                                        +---------+---------------+---------+-----------+----------+--------------+  POP      Full           Yes      Yes                                 +---------+---------------+---------+-----------+----------+--------------+ PTV      Full                                                        +---------+---------------+---------+-----------+----------+--------------+ PERO     Full                                                        +---------+---------------+---------+-----------+----------+--------------+   +----+---------------+---------+-----------+----------+--------------+ LEFTCompressibilityPhasicitySpontaneityPropertiesThrombus Aging +----+---------------+---------+-----------+----------+--------------+ CFV Full           Yes      Yes                                 +----+---------------+---------+-----------+----------+--------------+    Summary: RIGHT: - There is no evidence of deep vein thrombosis in the lower extremity.  - No cystic structure found in the popliteal fossa.  LEFT: - No evidence of common femoral vein obstruction.  *See table(s) above for measurements and observations. Electronically signed by Sherald Hess MD on 02/12/2022 at 11:23:51 AM.    Final    CT IMAGE GUIDED DRAINAGE BY PERCUTANEOUS CATHETER  Result Date: 02/10/2022 INDICATION: History of tubo-ovarian abscess, post CT-guided placement right lower abdominal/pelvic drainage catheter on 02/05/2022. Postprocedural CT scan performed 02/08/2022 demonstrates an unchanged fluid collection within the right lower pelvis and as such request made for placement of an additional percutaneous drainage  catheter for infection source control purposes. Additionally, there is request for CT-guided exchange and/or repositioning of the pre-existing drainage catheter into the more dominant component of the residual pelvic abscess EXAM: 1. CT GUIDED RIGHT TRANS GLUTEAL APPROACH TUBO-OVARIAN ABSCESS DRAINAGE CATHETER PLACEMENT 2. CT-GUIDED EXCHANGE AND REPOSITIONING OF EXISTING RIGHT LOWER ABDOMINAL/PELVIC DRAINAGE CATHETER COMPARISON:  CT abdomen pelvis-02/08/2022; 02/04/2022 CT guided right lower abdominal/pelvic abscess drainage catheter placement-02/05/2022 MEDICATIONS: The patient is currently admitted to the hospital and receiving intravenous antibiotics. The antibiotics were administered within an appropriate time frame prior to the initiation of the procedure. ANESTHESIA/SEDATION: Moderate (conscious) sedation was employed during this procedure as administered by the Interventional Radiology RN. A total of Versed 4 mg, Dilaudid 1 mg and Fentanyl 150 mcg was administered intravenously. Moderate Sedation Time: 60 minutes. The patient's level of consciousness and vital signs were monitored continuously by radiology nursing throughout the procedure under my direct supervision. CONTRAST:  None COMPLICATIONS: None immediate. PROCEDURE: RADIATION DOSE REDUCTION: This exam was performed according to the departmental dose-optimization program which includes automated exposure control, adjustment of the mA and/or kV according to patient size and/or use of iterative reconstruction technique. Informed written consent was obtained from the patient after a discussion of the risks, benefits and alternatives to treatment. The patient was initially placed prone on the CT gantry and a pre procedural CT was performed re-demonstrating the known abscess/fluid collection within the right  hemipelvis with dominant component measuring approximately 5.5 x 5.4 cm (image 11, series 2). The procedure was planned. A timeout was performed prior  to the initiation of the procedure. The skin overlying the right buttocks was prepped and draped in the usual sterile fashion. The overlying soft tissues were anesthetized with 1% lidocaine with epinephrine. Appropriate trajectory was planned with the use of a 22 gauge spinal needle. An 18 gauge trocar needle was advanced into the abscess/fluid collection and a short Amplatz super stiff wire was coiled within the collection. Appropriate positioning was confirmed with a limited CT scan. The tract was serially dilated allowing placement of a 10 [place] all-purpose drainage catheter. Appropriate positioning was confirmed with a limited postprocedural CT scan. Approximately 65 ml of purulent appearing serous fluid was aspirated. The tube was connected to a drainage bag and secured in place within interrupted suture and a Stat Lock device. A dressing was applied. The patient was then repositioned supine on the CT gantry and noncontrast images were obtained of the lower pelvis and pre-existing right anterior approach lower abdominal/pelvic drainage catheter. Attempts were made to retract the existing drainage catheter into the dominant component of the residual collection however this proved uncomfortable for the patient. As such, the external portion of the drainage catheter was prepped and draped in the usual sterile fashion. The external portion of the drainage catheter was cut and cannulated with a short Amplatz wire. Existing 12 French drainage catheter was then exchanged for a new 12 French percutaneous catheter which under intermittent CT guidance was repositioned into the dominant residual component of the right lower quadrant collection (series 11). The drainage catheter was connected to a gravity bag and secured in place within interrupted suture and a Stat Lock device. Patient tolerated the above procedures well (though did require a generous amount of conscious sedation medications) without immediate  postprocedural complication. IMPRESSION: 1. Successful CT guided placement of a 10 [place] all purpose drain catheter into the undrained pelvic abscess via right trans gluteal approach with aspiration of 65 mL of purulent fluid. Samples were sent to the laboratory as requested by the ordering clinical team. 2. Successful CT guided exchange and repositioning of pre-existing 12 French right anterior abdominal/pelvic drainage catheter with end now coiled and locked within the residual anteriorly located abscess cavity. Electronically Signed   By: Simonne Come M.D.   On: 02/10/2022 16:33   CT ABSCESS CATH EXCHANGE  Result Date: 02/10/2022 INDICATION: History of tubo-ovarian abscess, post CT-guided placement right lower abdominal/pelvic drainage catheter on 02/05/2022. Postprocedural CT scan performed 02/08/2022 demonstrates an unchanged fluid collection within the right lower pelvis and as such request made for placement of an additional percutaneous drainage catheter for infection source control purposes. Additionally, there is request for CT-guided exchange and/or repositioning of the pre-existing drainage catheter into the more dominant component of the residual pelvic abscess EXAM: 1. CT GUIDED RIGHT TRANS GLUTEAL APPROACH TUBO-OVARIAN ABSCESS DRAINAGE CATHETER PLACEMENT 2. CT-GUIDED EXCHANGE AND REPOSITIONING OF EXISTING RIGHT LOWER ABDOMINAL/PELVIC DRAINAGE CATHETER COMPARISON:  CT abdomen pelvis-02/08/2022; 02/04/2022 CT guided right lower abdominal/pelvic abscess drainage catheter placement-02/05/2022 MEDICATIONS: The patient is currently admitted to the hospital and receiving intravenous antibiotics. The antibiotics were administered within an appropriate time frame prior to the initiation of the procedure. ANESTHESIA/SEDATION: Moderate (conscious) sedation was employed during this procedure as administered by the Interventional Radiology RN. A total of Versed 4 mg, Dilaudid 1 mg and Fentanyl 150 mcg was  administered intravenously. Moderate Sedation Time: 60 minutes. The  patient's level of consciousness and vital signs were monitored continuously by radiology nursing throughout the procedure under my direct supervision. CONTRAST:  None COMPLICATIONS: None immediate. PROCEDURE: RADIATION DOSE REDUCTION: This exam was performed according to the departmental dose-optimization program which includes automated exposure control, adjustment of the mA and/or kV according to patient size and/or use of iterative reconstruction technique. Informed written consent was obtained from the patient after a discussion of the risks, benefits and alternatives to treatment. The patient was initially placed prone on the CT gantry and a pre procedural CT was performed re-demonstrating the known abscess/fluid collection within the right hemipelvis with dominant component measuring approximately 5.5 x 5.4 cm (image 11, series 2). The procedure was planned. A timeout was performed prior to the initiation of the procedure. The skin overlying the right buttocks was prepped and draped in the usual sterile fashion. The overlying soft tissues were anesthetized with 1% lidocaine with epinephrine. Appropriate trajectory was planned with the use of a 22 gauge spinal needle. An 18 gauge trocar needle was advanced into the abscess/fluid collection and a short Amplatz super stiff wire was coiled within the collection. Appropriate positioning was confirmed with a limited CT scan. The tract was serially dilated allowing placement of a 10 [place] all-purpose drainage catheter. Appropriate positioning was confirmed with a limited postprocedural CT scan. Approximately 65 ml of purulent appearing serous fluid was aspirated. The tube was connected to a drainage bag and secured in place within interrupted suture and a Stat Lock device. A dressing was applied. The patient was then repositioned supine on the CT gantry and noncontrast images were obtained of the  lower pelvis and pre-existing right anterior approach lower abdominal/pelvic drainage catheter. Attempts were made to retract the existing drainage catheter into the dominant component of the residual collection however this proved uncomfortable for the patient. As such, the external portion of the drainage catheter was prepped and draped in the usual sterile fashion. The external portion of the drainage catheter was cut and cannulated with a short Amplatz wire. Existing 12 French drainage catheter was then exchanged for a new 12 French percutaneous catheter which under intermittent CT guidance was repositioned into the dominant residual component of the right lower quadrant collection (series 11). The drainage catheter was connected to a gravity bag and secured in place within interrupted suture and a Stat Lock device. Patient tolerated the above procedures well (though did require a generous amount of conscious sedation medications) without immediate postprocedural complication. IMPRESSION: 1. Successful CT guided placement of a 10 [place] all purpose drain catheter into the undrained pelvic abscess via right trans gluteal approach with aspiration of 65 mL of purulent fluid. Samples were sent to the laboratory as requested by the ordering clinical team. 2. Successful CT guided exchange and repositioning of pre-existing 12 French right anterior abdominal/pelvic drainage catheter with end now coiled and locked within the residual anteriorly located abscess cavity. Electronically Signed   By: Simonne Come M.D.   On: 02/10/2022 16:33   CT ABDOMEN PELVIS W CONTRAST  Result Date: 02/08/2022 CLINICAL DATA:  Sepsis EXAM: CT ABDOMEN AND PELVIS WITH CONTRAST TECHNIQUE: Multidetector CT imaging of the abdomen and pelvis was performed using the standard protocol following bolus administration of intravenous contrast. RADIATION DOSE REDUCTION: This exam was performed according to the departmental dose-optimization program  which includes automated exposure control, adjustment of the mA and/or kV according to patient size and/or use of iterative reconstruction technique. CONTRAST:  OMNIPAQUE IOHEXOL 300 MG/ML  SOLN COMPARISON:  Previous studies including the CT done on Feb 19, 2022 FINDINGS: Lower chest: Small bilateral pleural effusions are seen more so on the right side. There are linear patchy infiltrates in the lingula and both lower lobes suggesting atelectasis/pneumonia. Hepatobiliary: No focal abnormality is seen in the liver. There is no dilation of bile ducts. Gallbladder is unremarkable. Small ascites is noted in the perihepatic region. Pancreas: No focal abnormality is seen. Spleen: Unremarkable. Adrenals/Urinary Tract: Adrenals are not enlarged. There is no hydronephrosis. There are no renal or ureteral stones. Urinary bladder is unremarkable. There is 3 mm low-density in the midportion of left kidney which is too small to be characterized. Stomach/Bowel: Stomach is not distended. Small bowel loops are not dilated. Appendix is not seen. There is no significant wall thickening in the colon. There is interval placement of percutaneous drainage catheter with its tip in the right lower abdomen. There is interval decrease in size of the complex fluid collection in the right lower quadrant. There is 4 cm loculated fluid collection slightly to the left of midline at the level of pelvic inlet. There is 7.2 x 5.3 x 4.9 cm loculated fluid collection with minimally enhancing margin in the cul-de-sac in the pelvis. There are no pockets of air within these loculated fluid collections. There is stranding in the fat planes in the lower abdomen and pelvis. Vascular/Lymphatic: Vascular structures are unremarkable. Reproductive: Uterus is to the right of midline. Other: There is no pneumoperitoneum. Musculoskeletal: No acute findings are seen. IMPRESSION: Interval placement of percutaneous drainage catheter within air-fluid collection in  the right lower abdomen with interval decrease in size. There is 7.2 cm loculated fluid collection in the cul-de-sac in the pelvis with slightly enhancing margins. There is another 4 cm loculated fluid collection in the left side of the pelvis. These may suggest loculated serous fluid collections or abscesses. Small ascites is noted in the perihepatic region. There is no evidence of intestinal obstruction or pneumoperitoneum. There is no hydronephrosis. Small bilateral pleural effusions. There are linear patchy infiltrates in both lower lung fields suggesting atelectasis/pneumonia. Electronically Signed   By: Ernie Avena M.D.   On: 02/08/2022 09:41   CT IMAGE GUIDED DRAINAGE BY PERCUTANEOUS CATHETER  Result Date: 02/05/2022 INDICATION: PID with large abdominopelvic fluid collection. EXAM: CT GUIDED DRAINAGE CATHETER PLACEMENT INTO ABDOMINOPELVIC FLUID COLLECTION RADIATION DOSE REDUCTION: This exam was performed according to the departmental dose-optimization program which includes automated exposure control, adjustment of the mA and/or kV according to patient size and/or use of iterative reconstruction technique. COMPARISON:  CT AP, 02/19/2022 MEDICATIONS: The patient is currently admitted to the hospital and receiving intravenous antibiotics. The antibiotics were administered within an appropriate time frame prior to the initiation of the procedure. ANESTHESIA/SEDATION: Moderate (conscious) sedation was employed during this procedure. A total of Versed 3 mg and Fentanyl 75 mcg was administered intravenously. Additionally, 1 mg Dilaudid was administered IV. Moderate Sedation Time: 19 minutes. The patient's level of consciousness and vital signs were monitored continuously by radiology nursing throughout the procedure under my direct supervision. CONTRAST:  None COMPLICATIONS: None immediate. PROCEDURE: Informed written consent was obtained from the patient and/or patient's representative after a  discussion of the risks, benefits and alternatives to treatment. The patient was placed supine on the CT gantry and a pre procedural CT was performed re-demonstrating the known abscess/fluid collection within the lower abdomen/pelvis. The procedure was planned. A timeout was performed prior to the initiation of the procedure. The RIGHT lower quadrant was prepped  and draped in the usual sterile fashion. The overlying soft tissues were anesthetized with 1% lidocaine with epinephrine. Appropriate trajectory was planned with the use of a 22 gauge spinal needle. An 18 gauge trocar needle was advanced into the abscess/fluid collection and a short Amplatz super stiff wire was coiled within the collection. Appropriate positioning was confirmed with a limited CT scan. The tract was serially dilated allowing placement of a 12 [place] all-purpose drainage catheter. Appropriate positioning was confirmed with a limited postprocedural CT scan. 5 mL ml of serous fluid was aspirated and submitted for analysis. The tube was connected to a drainage bag and sutured in place. A dressing was placed. The patient tolerated the procedure well without immediate post procedural complication. IMPRESSION: Successful CT guided placement of a 12 Fr drainage catheter into the abdominopelvic collection with aspiration of serous fluid, as above. Samples were sent to the laboratory as requested by the ordering clinical team. Roanna Banning, MD [facility] Electronically Signed   By: Roanna Banning M.D.   On: 02/05/2022 18:08   CT ABDOMEN PELVIS WO CONTRAST  Result Date: 02/04/2022 CLINICAL DATA:  Abdominal pain, acute, nonlocalized septic shock, abnormal uterine US, concern for PID EXAM: CT ABDOMEN AND PELVIS WITHOUT CONTRAST TECHNIQUE: Multidetector CT imaging of the abdomen and pelvis was performed following the standard protocol without IV contrast. RADIATION DOSE REDUCTION: This exam was  performed according to the departmental dose-optimization program which includes automated exposure control, adjustment of the mA and/or kV according to patient size and/or use of iterative reconstruction technique. COMPARISON:  Pelvic ultrasound today FINDINGS: Lower chest: Pleural thickening noted peripherally in the right lower hemithorax, possibly representing trace loculated pleural effusion. No confluent airspace opacities. Hepatobiliary: No focal hepatic abnormality. Gallbladder unremarkable. Pancreas: No focal abnormality or ductal dilatation. Spleen: No focal abnormality.  Normal size. Adrenals/Urinary Tract: No adrenal abnormality. No focal renal abnormality. No stones or hydronephrosis. Urinary bladder is unremarkable. Stomach/Bowel: Wall thickening noted in the cecum which is immediately adjacent to the large pelvic fluid collection described below. This could reflect colitis or be secondarily inflamed. Stomach and small bowel grossly unremarkable. Vascular/Lymphatic: No evidence of aneurysm or adenopathy. Reproductive: Large complex cystic area or fluid collection seen in the pelvis extending from the cul-de-sac superiorly into the right lower quadrant adjacent to the cecum. This measures approximately 12 x 8 cm on image 64 of series 3. Internal areas of increased density. This could reflect blood or abscess/PID. Uterus grossly unremarkable on this noncontrast study. Ovaries not definitively visualized. Other: There is free fluid adjacent to the liver and spleen. Musculoskeletal: No acute bony abnormality. IMPRESSION: Complex process in the pelvis including what appears to be a large complex fluid collection measuring 12 x 8 cm extending from the cul-de-sac superiorly to the cecum which could reflect blood products or abscess. This also could conceivably be related to and ovary. Neither ovary definitively identified. Wall thickening in the cecum could reflect colitis or secondary inflammation related to  the pelvic process. Perihepatic and perisplenic ascites. Possible trace loculated right pleural effusion peripherally in the right lower hemithorax. Electronically Signed   By: Charlett Nose M.D.   On: 02/04/2022 22:29   US PELVIC COMPLETE WITH TRANSVAGINAL  Addendum Date: 02/04/2022   ADDENDUM REPORT: 02/04/2022 13:44 ADDENDUM: In light of negative quantitative beta HCG and no documented pregnancy the possibility of infectious causes for the above findings may be more likely but findings are essentially quite nonspecific at this time.  Even noncontrast CT could prove beneficial to allow for assessment of pelvic fluid to determine whether this represents frank hemoperitoneum and to assess for any associated inflammatory changes that may be present not well assessed on ultrasound. These results were called by telephone at the time of interpretation on 02/04/2022 at 1:44 pm to provider [facility] , who verbally acknowledged these results. Electronically Signed   By: Donzetta Kohut M.D.   On: 02/04/2022 13:44   Result Date: 02/04/2022 CLINICAL DATA:  A 30 year old female presents with pelvic pain and bleeding. Patient reports positive pregnancy but urine pregnancy currently negative by report and beta HCG is currently pending. EXAM: TRANSABDOMINAL AND TRANSVAGINAL ULTRASOUND OF PELVIS TECHNIQUE: Both transabdominal and transvaginal ultrasound examinations of the pelvis were performed. Transabdominal technique was performed for global imaging of the pelvis including uterus, ovaries, adnexal regions, and pelvic cul-de-sac. It was necessary to proceed with endovaginal exam following the transabdominal exam to visualize the endometrium and adnexa. COMPARISON:  No recent imaging is available for comparison. FINDINGS: Uterus Measurements: 12.6 x 8.0 x 8.5 (volume = 450 cc) cm. = volume: 450 mL. Heterogeneous uterus with thickened endometrium. Cystic area in the endometrial canal that measures 4.5 x 2.6 x 4.4 cm. This  cystic characteristics and central increased echogenicity. Central increased echogenicity the without discrete finding that would suggest embryo with very limited assessment due to patient discomfort and clinical condition. Endometrium Thickness: 3.3 cm. Thickened with cystic area in the fundus of the uterus. Right ovary Measurements: 3.2 x 2.7 x 2.1 (volume = 9.5 cc) cm = volume: 9.5 mL. Normal appearance/no adnexal mass. Left ovary Not visible, in the area of the LEFT adnexa is heterogeneous material that has the appearance of blood products but conforms to the cul-de-sac and LEFT adnexa. Other findings Moderate heterogeneous fluid in the LEFT adnexa primarily but also tracking along the RIGHT uterus. IMPRESSION: Unusual cystic area in the endometrial canal. If this patient has any history of recent positive pregnancy test this could represent a failed gestation with retained products of conception. Pseudo gestational sac with areas of internal hemorrhage in the setting of ruptured ectopic pregnancy is also considered. Heterogeneous appearance of material in the cul-de-sac could also potentially represent sequela of pyogenic infection though this is lower on the differential at this time based on appearance. If the patient could tolerate further evaluation with cross-sectional imaging CT could potentially be helpful. Ultimately gyn consultation is suggested. Critical Value/emergent results were called by telephone at the time of interpretation on 02/04/2022 at 11:01 am to provider [facility] , who verbally acknowledged these results. Electronically Signed: By: Donzetta Kohut M.D. On: 02/04/2022 11:01   DG Chest Port 1 View  Result Date: 02/04/2022 CLINICAL DATA:  31 year old female with history of lower abdominal pain and vaginal bleeding 3 days ago. Fever. Low blood pressure. EXAM: PORTABLE CHEST 1 VIEW COMPARISON:  Chest x-ray 08/09/2021. FINDINGS: Lung volumes are slightly low. No consolidative airspace  disease. No pleural effusions. No pneumothorax. No evidence of pulmonary edema. Heart size is normal. The patient is rotated to the right on today's exam, resulting in distortion of the mediastinal contours and reduced diagnostic sensitivity and specificity for mediastinal pathology. IMPRESSION: 1. No radiographic evidence of acute cardiopulmonary disease. Electronically Signed   By: Trudie Reed M.D.   On: 02/04/2022 10:55    Scheduled Meds:  docusate sodium  100 mg Oral BID   doxycycline  100 mg Oral Q12H   enoxaparin (LOVENOX) injection  40 mg  Subcutaneous Q24H   feeding supplement  237 mL Oral BID BM   gabapentin  300 mg Oral BID   metroNIDAZOLE  500 mg Oral Q12H   multivitamin with minerals  1 tablet Oral Daily   nicotine  21 mg Transdermal Daily   polyethylene glycol  17 g Oral Daily   sodium chloride flush  3 mL Intravenous Q12H   sodium chloride flush  5 mL Intracatheter Q8H   traZODone  50 mg Oral QHS   Continuous Infusions:  cefTRIAXone (ROCEPHIN)  IV Stopped (02/16/22 1935)   dextrose 5% lactated ringers Stopped (02/16/22 2100)   vancomycin Stopped (02/16/22 2304)   PRN Meds:bisacodyl, diphenhydrAMINE **OR** diphenhydrAMINE, HYDROmorphone (DILAUDID) injection, hydrOXYzine, menthol-cetylpyridinium, ondansetron **OR** ondansetron (ZOFRAN) IV, oxyCODONE, polyvinyl alcohol, simethicone, sodium chloride flush, temazepam    LOS: 8 days   Milas Hock, MD 02/17/2022 7:21 AM

## 2022-02-18 ENCOUNTER — Inpatient Hospital Stay (HOSPITAL_COMMUNITY): Payer: Medicaid Other

## 2022-02-18 ENCOUNTER — Inpatient Hospital Stay: Payer: Self-pay

## 2022-02-18 LAB — CBC
HCT: 25.6 % — ABNORMAL LOW (ref 36.0–46.0)
Hemoglobin: 8.4 g/dL — ABNORMAL LOW (ref 12.0–15.0)
MCH: 29.1 pg (ref 26.0–34.0)
MCHC: 32.8 g/dL (ref 30.0–36.0)
MCV: 88.6 fL (ref 80.0–100.0)
Platelets: 649 10*3/uL — ABNORMAL HIGH (ref 150–400)
RBC: 2.89 MIL/uL — ABNORMAL LOW (ref 3.87–5.11)
RDW: 17.3 % — ABNORMAL HIGH (ref 11.5–15.5)
WBC: 12.9 10*3/uL — ABNORMAL HIGH (ref 4.0–10.5)
nRBC: 0 % (ref 0.0–0.2)

## 2022-02-18 LAB — BASIC METABOLIC PANEL
Anion gap: 8 (ref 5–15)
BUN: 5 mg/dL — ABNORMAL LOW (ref 6–20)
CO2: 29 mmol/L (ref 22–32)
Calcium: 8.4 mg/dL — ABNORMAL LOW (ref 8.9–10.3)
Chloride: 104 mmol/L (ref 98–111)
Creatinine, Ser: 0.8 mg/dL (ref 0.44–1.00)
GFR, Estimated: >60 mL/min (ref >60)
Glucose, Bld: 94 mg/dL (ref 70–99)
Potassium: 3.2 mmol/L — ABNORMAL LOW (ref 3.5–5.1)
Sodium: 141 mmol/L (ref 135–145)

## 2022-02-18 MED ORDER — ALUM & MAG HYDROXIDE-SIMETH 200-200-20 MG/5ML PO SUSP
30.0000 mL | Freq: Once | ORAL | Status: AC
  Administered 2022-02-18: 30 mL via ORAL
  Filled 2022-02-18: qty 30

## 2022-02-18 MED ORDER — LACTATED RINGERS IV BOLUS
1000.0000 mL | Freq: Once | INTRAVENOUS | Status: AC
  Administered 2022-02-18: 1000 mL via INTRAVENOUS

## 2022-02-18 MED ORDER — POTASSIUM CHLORIDE 10 MEQ/100ML IV SOLN
10.0000 meq | INTRAVENOUS | Status: AC
  Administered 2022-02-18 – 2022-02-19 (×4): 10 meq via INTRAVENOUS
  Filled 2022-02-18 (×4): qty 100

## 2022-02-18 MED ORDER — PROCHLORPERAZINE EDISYLATE 10 MG/2ML IJ SOLN
10.0000 mg | Freq: Four times a day (QID) | INTRAMUSCULAR | Status: DC | PRN
Start: 2022-02-18 — End: 2022-02-28
  Administered 2022-02-19 – 2022-02-28 (×5): 10 mg via INTRAVENOUS
  Filled 2022-02-18 (×5): qty 2

## 2022-02-18 MED ORDER — KETOROLAC TROMETHAMINE 15 MG/ML IJ SOLN
15.0000 mg | Freq: Four times a day (QID) | INTRAMUSCULAR | Status: DC
Start: 2022-02-18 — End: 2022-02-22
  Administered 2022-02-18 – 2022-02-22 (×15): 15 mg via INTRAVENOUS
  Filled 2022-02-18 (×16): qty 1

## 2022-02-18 MED ORDER — SODIUM CHLORIDE 0.9% FLUSH
10.0000 mL | INTRAVENOUS | Status: DC | PRN
  Administered 2022-02-23 – 2022-02-27 (×3): 10 mL

## 2022-02-18 MED ORDER — SODIUM CHLORIDE 0.9% FLUSH
10.0000 mL | Freq: Two times a day (BID) | INTRAVENOUS | Status: DC
  Administered 2022-02-19 (×2): 10 mL
  Administered 2022-02-20: 20 mL
  Administered 2022-02-20 – 2022-02-27 (×11): 10 mL

## 2022-02-18 MED ORDER — POTASSIUM CHLORIDE 20 MEQ PO PACK
80.0000 meq | PACK | Freq: Two times a day (BID) | ORAL | Status: DC
  Administered 2022-02-18: 80 meq via ORAL
  Filled 2022-02-18 (×2): qty 4

## 2022-02-18 MED ORDER — DULOXETINE HCL 20 MG PO CPEP
20.0000 mg | ORAL_CAPSULE | Freq: Every day | ORAL | Status: DC
  Administered 2022-02-18 – 2022-02-19 (×2): 20 mg via ORAL
  Filled 2022-02-18 (×2): qty 1

## 2022-02-18 MED ORDER — LIDOCAINE VISCOUS HCL 2 % MT SOLN
15.0000 mL | Freq: Once | OROMUCOSAL | Status: AC
  Administered 2022-02-18: 15 mL via ORAL
  Filled 2022-02-18: qty 15

## 2022-02-18 MED ORDER — GABAPENTIN 300 MG PO CAPS
300.0000 mg | ORAL_CAPSULE | Freq: Three times a day (TID) | ORAL | Status: DC
  Administered 2022-02-18 – 2022-02-28 (×27): 300 mg via ORAL
  Filled 2022-02-18 (×29): qty 1

## 2022-02-18 MED ORDER — HYDROMORPHONE HCL 1 MG/ML IJ SOLN
1.0000 mg | Freq: Four times a day (QID) | INTRAMUSCULAR | Status: DC | PRN
  Administered 2022-02-18 – 2022-02-21 (×12): 1 mg via INTRAVENOUS
  Filled 2022-02-18 (×12): qty 1

## 2022-02-18 MED ORDER — LIDOCAINE 5 % EX PTCH
1.0000 | MEDICATED_PATCH | Freq: Every day | CUTANEOUS | Status: DC
  Administered 2022-02-19 – 2022-02-27 (×9): 1 via TRANSDERMAL
  Filled 2022-02-18 (×10): qty 1

## 2022-02-18 MED ORDER — CHLORHEXIDINE GLUCONATE CLOTH 2 % EX PADS
6.0000 | MEDICATED_PAD | Freq: Every day | CUTANEOUS | Status: DC
Start: 2022-02-18 — End: 2022-02-28
  Administered 2022-02-18 – 2022-02-28 (×11): 6 via TOPICAL

## 2022-02-18 NOTE — Progress Notes (Signed)
Patient ID: Paula Lucas, female   DOB: [birth year], 31 y.o.   MRN: [id number]   Assessment/Plan: Principal Problem:   Pelvic abscess  Active Problems:   Sepsis (HCC)   Left lower quadrant abdominal pain   MRSA (methicillin resistant staph aureus) culture positive   Hepatic abscess   Hx of Fitz-Hugh-Curtis syndrome  Pelvic abscess/ Hepatic abscess Patient has multiple abscesses in her abdomen and pelvis related to prior PID with current GC and Chlamydia negative. Patient now s/p ex lap with pelvic and abdominal wash out, POD #2. Has had IR drains placed x2 and has been on IV antibiotics for many days without significant improvement in her level of pain.Her WBC is coming down and she is afebrile. Still having abdominal pain and poor appetite.  +ve Flatus   Continue Vancomycin, Rocephin, Doxycycline, Flagyl--appreciate ID input   Pain medications as needed  W-->D dressing changes ordered--declined wound vac (may re-visit after dressing changes  Increase ambulation  If further surgery is planned, would need GYN ONC consult.  Sepsis [facility]) is not currently actively septic however surgical exploration would certainly increase this possibility.   MRSA (methicillin resistant staph aureus) culture positive On vancomycin   Hypokalemia Replete and trend  Subjective: Interval History:Poor appetite--still with abdominal pain  Objective: Vital signs in last 24 hours: Temp:  [98.2 F (36.8 C)-99.3 F (37.4 C)] 99.3 F (37.4 C) (06/29 0808) Pulse Rate:  [100-113] 113 (06/29 0808) Resp:  [20-22] 22 (06/29 0808) BP: (129-130)/(94-96) 129/94 (06/29 0808) SpO2:  [90 %] 90 % (06/28 2007)  Intake/Output from previous day: 06/28 0701 - 06/29 0700 In: 410  Out: 750 [Urine:700; Drains:50] Intake/Output this shift: Total I/O In: -  Out: 640 [Urine:600; Drains:40]  General appearance: alert, cooperative, and appears stated age Head: Normocephalic, without obvious abnormality,  atraumatic Lungs: Normal effort Heart: mildly tachy rate, regular rhythm Abdomen: less distended, diffusely tender  Extremities: Homans sign is negative, no sign of DVT Skin: Skin color, texture, turgor normal. No rashes or lesions Neurologic: Grossly normal  Results for orders placed or performed during the hospital encounter of 02/08/22 (from the past 24 hour(s))  Vancomycin, peak     Status: Abnormal   Collection Time: 02/17/22 11:52 AM  Result Value Ref Range   Vancomycin Pk 24 (L) 30 - 40 ug/mL  Vancomycin, trough     Status: Abnormal   Collection Time: 02/17/22  8:10 PM  Result Value Ref Range   Vancomycin Tr 14 (L) 15 - 20 ug/mL  CBC     Status: Abnormal   Collection Time: 02/18/22  1:54 AM  Result Value Ref Range   WBC 12.9 (H) 4.0 - 10.5 K/uL   RBC 2.89 (L) 3.87 - 5.11 MIL/uL   Hemoglobin 8.4 (L) 12.0 - 15.0 g/dL   HCT 56.2 (L) 13.0 - 86.5 %   MCV 88.6 80.0 - 100.0 fL   MCH 29.1 26.0 - 34.0 pg   MCHC 32.8 30.0 - 36.0 g/dL   RDW 78.4 (H) 69.6 - 29.5 %   Platelets 649 (H) 150 - 400 K/uL   nRBC 0.0 0.0 - 0.2 %  Basic metabolic panel     Status: Abnormal   Collection Time: 02/18/22  1:54 AM  Result Value Ref Range   Sodium 141 135 - 145 mmol/L   Potassium 3.2 (L) 3.5 - 5.1 mmol/L   Chloride 104 98 - 111 mmol/L   CO2 29 22 - 32 mmol/L   Glucose, Bld 94 70 -  99 mg/dL   BUN 5 (L) 6 - 20 mg/dL   Creatinine, Ser 0.96 0.44 - 1.00 mg/dL   Calcium 8.4 (L) 8.9 - 10.3 mg/dL   GFR, Estimated >04 >54 mL/min   Anion gap 8 5 - 15    Studies/Results: CT ABDOMEN PELVIS W CONTRAST  Result Date: 02/14/2022 CLINICAL DATA:  Follow-up intra-abdominal abscesses. EXAM: CT ABDOMEN AND PELVIS WITH CONTRAST TECHNIQUE: Multidetector CT imaging of the abdomen and pelvis was performed using the standard protocol following bolus administration of intravenous contrast. RADIATION DOSE REDUCTION: This exam was performed according to the departmental dose-optimization program which includes  automated exposure control, adjustment of the mA and/or kV according to patient size and/or use of iterative reconstruction technique. CONTRAST:  OMNIPAQUE IOHEXOL 300 MG/ML  SOLN COMPARISON:  CT scan 02/08/2022 FINDINGS: Lower chest: Persistent moderate-sized pleural effusions with overlying atelectasis. Hepatobiliary: Small residual lentiform rim enhancing abscess at the dome of the liver anteriorly measuring a maximum of 5.6 x 1.1 cm. This previously measured approximately 9.3 x 1.1 cm. Inferiorly there is a persistent perihepatic abscess. It measures approximately 9.5 x 3.1 cm and previously measured 10.1 x 3.7 cm. No worrisome hepatic lesions or intrahepatic biliary dilatation. The gallbladder is unremarkable. No common bile duct dilatation. Pancreas: No mass, inflammation or ductal dilatation. Spleen: Normal size.  No focal lesions. Adrenals/Urinary Tract: Adrenal glands and kidneys are unremarkable and stable. The bladder is grossly normal. Stomach/Bowel: The stomach, duodenum, small bowel and colon grossly normal. Vascular/Lymphatic: The aorta and branch vessels are patent. The major venous structures are patent. Stable borderline enlarged mesenteric and retroperitoneal lymph nodes, likely reactive/inflammatory. Reproductive: The uterus and ovaries are stable. Other: Percutaneous drainage catheter in the upper right pelvis with a persistent rim enhancing abscess measuring approximately 8.8 x 3.5 cm and previously measuring 9.8 x 5.6 cm. A second drainage catheter in the lower right cul-de-sac area with a small persistent rim enhancing abscess measuring 4.1 x 2.0 cm. This previously measured 5.2 x 4.9 cm. Left-sided rounded pelvic abscess measures 3.6 x 2.9 cm on image 71/3. This previously measured 3.5 x 2.7 cm. Persistent diffuse mesenteric and subcutaneous inflammations/edema. Musculoskeletal: No significant bony findings. IMPRESSION: 1. Persistent moderate-sized pleural effusions with overlying  atelectasis. 2. Persistent but slightly smaller undrained perihepatic abscesses. 3. Two right-sided pelvic drainage catheters with persistent but smaller surrounding abscesses. 4. Left-sided undrained pelvic abscess is slightly smaller also. 5. Persistent diffuse mesenteric and subcutaneous inflammation. 6. Stable borderline enlarged mesenteric and retroperitoneal lymph nodes, likely reactive/inflammatory. Electronically Signed   By: Rudie Meyer M.D.   On: 02/14/2022 14:46   MR Lumbar Spine W Wo Contrast  Result Date: 02/13/2022 CLINICAL DATA:  Initial evaluation for back pain, history of MRSA bacteremia. EXAM: MRI LUMBAR SPINE WITHOUT AND WITH CONTRAST TECHNIQUE: Multiplanar and multiecho pulse sequences of the lumbar spine were obtained without and with intravenous contrast. CONTRAST:  7.6mL GADAVIST GADOBUTROL 1 MMOL/ML IV SOLN COMPARISON:  Prior CT from 02/08/2022. FINDINGS: Segmentation: Standard. Lowest well-formed disc space labeled the L5-S1 level. Alignment: Mild levoscoliosis with straightening of the normal lumbar lordosis. No listhesis. Vertebrae: Vertebral body height maintained without acute or chronic fracture. Bone marrow signal intensity diffusely decreased on T1 weighted sequence, nonspecific, but most commonly related to anemia, smoking or obesity. No discrete or worrisome osseous lesions. No evidence for osteomyelitis discitis or septic arthritis within the lumbar spine. Conus medullaris and cauda equina: Conus extends to the L1-2 level. Conus and cauda equina appear normal. Paraspinal and  other soft tissues: Paraspinous soft tissues demonstrate no acute finding. Multi loculated collections/abscesses with surrounding enhancement partially seen within the partially visualized pelvis, better characterized on recent CT. Remainder of the visualized visceral structures otherwise unremarkable. Disc levels: L1-2:  Unremarkable. L2-3: Small left subarticular disc protrusion mildly indents the left  ventral thecal sac (series 12, image 16). Borderline mild narrowing of the left lateral recess. Central canal remains widely patent. No foraminal stenosis. L3-4:  Unremarkable. L4-5:  Unremarkable. L5-S1: Left subarticular disc protrusion extends into the left lateral recess, contacting the descending left S1 nerve root (series 12, image 34). Resultant mild left lateral recess stenosis. Central canal remains patent. No significant foraminal stenosis. IMPRESSION: 1. No MRI evidence for acute infection within the lumbar spine. 2. Left subarticular disc protrusion at L5-S1, contacting the descending left S1 nerve root in the left lateral recess. 3. Small left subarticular disc protrusion at L2-3 with borderline mild left lateral recess stenosis. 4. Multiloculated collections/abscesses within the partially visualized pelvis, better characterized on recent CT. Electronically Signed   By: Rise Mu M.D.   On: 02/13/2022 04:04   ECHOCARDIOGRAM COMPLETE  Result Date: 02/12/2022    ECHOCARDIOGRAM REPORT   Patient Name:   Paula Lucas Date of Exam: 02/12/2022 Medical Rec #:  [id number]        Height:       63.0 in Accession #:    [id number]       Weight:       169.5 lb Date of Birth:  May 19, 1991        BSA:          1.802 m Patient Age:    31 years         BP:           113/87 mmHg Patient Gender: F                HR:           98 bpm. Exam Location:  Inpatient Procedure: 2D Echo, Cardiac Doppler and Color Doppler Indications:   Staph aureus infection  History:       Patient has no prior history of Echocardiogram examinations.                Sepsis.  Sonographer:   Ross Ludwig RDCS (AE) Referring      TRUNG T VU Phys: IMPRESSIONS  1. Left ventricular ejection fraction, by estimation, is 60 to 65%. The left ventricle has normal function. The left ventricle has no regional wall motion abnormalities. Left ventricular diastolic parameters were normal.  2. Right ventricular systolic function is normal. The right  ventricular size is normal.  3. The mitral valve is normal in structure. Trivial mitral valve regurgitation. No evidence of mitral stenosis.  4. The aortic valve is normal in structure. Aortic valve regurgitation is not visualized. No aortic stenosis is present. FINDINGS  Left Ventricle: Left ventricular ejection fraction, by estimation, is 60 to 65%. The left ventricle has normal function. The left ventricle has no regional wall motion abnormalities. The left ventricular internal cavity size was normal in size. There is  no left ventricular hypertrophy. Left ventricular diastolic parameters were normal. Right Ventricle: The right ventricular size is normal. Right vetricular wall thickness was not well visualized. Right ventricular systolic function is normal. Left Atrium: Left atrial size was normal in size. Right Atrium: Right atrial size was normal in size. Pericardium: There is no evidence of pericardial effusion. Mitral Valve: The mitral  valve is normal in structure. Trivial mitral valve regurgitation. No evidence of mitral valve stenosis. Tricuspid Valve: The tricuspid valve is normal in structure. Tricuspid valve regurgitation is trivial. Aortic Valve: The aortic valve is normal in structure. Aortic valve regurgitation is not visualized. No aortic stenosis is present. Aortic valve mean gradient measures 4.0 mmHg. Aortic valve peak gradient measures 7.1 mmHg. Aortic valve area, by VTI measures 2.25 cm. Pulmonic Valve: The pulmonic valve was normal in structure. Pulmonic valve regurgitation is not visualized. Aorta: The aortic root and ascending aorta are structurally normal, with no evidence of dilitation. IAS/Shunts: The atrial septum is grossly normal.  LEFT VENTRICLE PLAX 2D LVIDd:         4.80 cm LVIDs:         3.40 cm LV PW:         0.90 cm LV IVS:        1.10 cm LVOT diam:     1.90 cm LV SV:         49 LV SV Index:   27 LVOT Area:     2.84 cm  RIGHT VENTRICLE             IVC RV Basal diam:  2.30 cm      IVC diam: 1.50 cm RV S prime:     12.40 cm/s TAPSE (M-mode): 2.1 cm LEFT ATRIUM             Index        RIGHT ATRIUM          Index LA diam:        3.80 cm 2.11 cm/m   RA Area:     9.13 cm LA Vol (A2C):   26.4 ml 14.65 ml/m  RA Volume:   15.80 ml 8.77 ml/m LA Vol (A4C):   29.7 ml 16.48 ml/m LA Biplane Vol: 29.3 ml 16.26 ml/m  AORTIC VALVE AV Area (Vmax):    2.24 cm AV Area (Vmean):   2.21 cm AV Area (VTI):     2.25 cm AV Vmax:           133.00 cm/s AV Vmean:          87.900 cm/s AV VTI:            0.218 m AV Peak Grad:      7.1 mmHg AV Mean Grad:      4.0 mmHg LVOT Vmax:         105.00 cm/s LVOT Vmean:        68.600 cm/s LVOT VTI:          0.173 m LVOT/AV VTI ratio: 0.79  AORTA Ao Root diam: 2.90 cm Ao Asc diam:  2.70 cm  SHUNTS Systemic VTI:  0.17 m Systemic Diam: 1.90 cm Kristeen Miss MD Electronically signed by Kristeen Miss MD Signature Date/Time: 02/12/2022/4:39:15 PM    Final    VAS [place] LOWER EXTREMITY VENOUS (DVT)  Result Date: 02/12/2022  Lower Venous DVT Study Patient Name:  Paula Lucas  Date of Exam:   02/12/2022 Medical Rec #: [id number]         Accession #:    [id number] Date of Birth: 1991-06-04         Patient Gender: F Patient Age:   35 years Exam Location:  [facility] Procedure:      VAS [place] LOWER EXTREMITY VENOUS (DVT) Referring Phys: JULIE MACHEN --------------------------------------------------------------------------------  Indications: Pain.  Risk Factors: None identified. Limitations: Poor patient  cooperation, patient pain tolerance. Comparison Study: No prior studies. Performing Technologist: Chanda Busing RVT  Examination Guidelines: A complete evaluation includes B-mode imaging, spectral Doppler, color Doppler, and power Doppler as needed of all accessible portions of each vessel. Bilateral testing is considered an integral part of a complete examination. Limited examinations for reoccurring indications may be performed as noted. The reflux portion of the exam is  performed with the patient in reverse Trendelenburg.  +---------+---------------+---------+-----------+----------+--------------+ RIGHT    CompressibilityPhasicitySpontaneityPropertiesThrombus Aging +---------+---------------+---------+-----------+----------+--------------+ CFV      Full           Yes      Yes                                 +---------+---------------+---------+-----------+----------+--------------+ SFJ      Full                                                        +---------+---------------+---------+-----------+----------+--------------+ FV Prox  Full                                                        +---------+---------------+---------+-----------+----------+--------------+ FV Mid   Full                                                        +---------+---------------+---------+-----------+----------+--------------+ FV DistalFull                                                        +---------+---------------+---------+-----------+----------+--------------+ PFV      Full                                                        +---------+---------------+---------+-----------+----------+--------------+ POP      Full           Yes      Yes                                 +---------+---------------+---------+-----------+----------+--------------+ PTV      Full                                                        +---------+---------------+---------+-----------+----------+--------------+ PERO     Full                                                        +---------+---------------+---------+-----------+----------+--------------+   +----+---------------+---------+-----------+----------+--------------+  LEFTCompressibilityPhasicitySpontaneityPropertiesThrombus Aging +----+---------------+---------+-----------+----------+--------------+ CFV Full           Yes      Yes                                  +----+---------------+---------+-----------+----------+--------------+    Summary: RIGHT: - There is no evidence of deep vein thrombosis in the lower extremity.  - No cystic structure found in the popliteal fossa.  LEFT: - No evidence of common femoral vein obstruction.  *See table(s) above for measurements and observations. Electronically signed by Sherald Hess MD on 02/12/2022 at 11:23:51 AM.    Final    CT IMAGE GUIDED DRAINAGE BY PERCUTANEOUS CATHETER  Result Date: 02/10/2022 INDICATION: History of tubo-ovarian abscess, post CT-guided placement right lower abdominal/pelvic drainage catheter on 02/05/2022. Postprocedural CT scan performed 02/08/2022 demonstrates an unchanged fluid collection within the right lower pelvis and as such request made for placement of an additional percutaneous drainage catheter for infection source control purposes. Additionally, there is request for CT-guided exchange and/or repositioning of the pre-existing drainage catheter into the more dominant component of the residual pelvic abscess EXAM: 1. CT GUIDED RIGHT TRANS GLUTEAL APPROACH TUBO-OVARIAN ABSCESS DRAINAGE CATHETER PLACEMENT 2. CT-GUIDED EXCHANGE AND REPOSITIONING OF EXISTING RIGHT LOWER ABDOMINAL/PELVIC DRAINAGE CATHETER COMPARISON:  CT abdomen pelvis-02/08/2022; 02/04/2022 CT guided right lower abdominal/pelvic abscess drainage catheter placement-02/05/2022 MEDICATIONS: The patient is currently admitted to the hospital and receiving intravenous antibiotics. The antibiotics were administered within an appropriate time frame prior to the initiation of the procedure. ANESTHESIA/SEDATION: Moderate (conscious) sedation was employed during this procedure as administered by the Interventional Radiology RN. A total of Versed 4 mg, Dilaudid 1 mg and Fentanyl 150 mcg was administered intravenously. Moderate Sedation Time: 60 minutes. The patient's level of consciousness and vital signs were monitored continuously by  radiology nursing throughout the procedure under my direct supervision. CONTRAST:  None COMPLICATIONS: None immediate. PROCEDURE: RADIATION DOSE REDUCTION: This exam was performed according to the departmental dose-optimization program which includes automated exposure control, adjustment of the mA and/or kV according to patient size and/or use of iterative reconstruction technique. Informed written consent was obtained from the patient after a discussion of the risks, benefits and alternatives to treatment. The patient was initially placed prone on the CT gantry and a pre procedural CT was performed re-demonstrating the known abscess/fluid collection within the right hemipelvis with dominant component measuring approximately 5.5 x 5.4 cm (image 11, series 2). The procedure was planned. A timeout was performed prior to the initiation of the procedure. The skin overlying the right buttocks was prepped and draped in the usual sterile fashion. The overlying soft tissues were anesthetized with 1% lidocaine with epinephrine. Appropriate trajectory was planned with the use of a 22 gauge spinal needle. An 18 gauge trocar needle was advanced into the abscess/fluid collection and a short Amplatz super stiff wire was coiled within the collection. Appropriate positioning was confirmed with a limited CT scan. The tract was serially dilated allowing placement of a 10 [place] all-purpose drainage catheter. Appropriate positioning was confirmed with a limited postprocedural CT scan. Approximately 65 ml of purulent appearing serous fluid was aspirated. The tube was connected to a drainage bag and secured in place within interrupted suture and a Stat Lock device. A dressing was applied. The patient was then repositioned supine on the CT gantry and noncontrast images were obtained of the lower pelvis and pre-existing right anterior approach lower abdominal/pelvic  drainage catheter. Attempts were made to retract the existing drainage  catheter into the dominant component of the residual collection however this proved uncomfortable for the patient. As such, the external portion of the drainage catheter was prepped and draped in the usual sterile fashion. The external portion of the drainage catheter was cut and cannulated with a short Amplatz wire. Existing 12 French drainage catheter was then exchanged for a new 12 French percutaneous catheter which under intermittent CT guidance was repositioned into the dominant residual component of the right lower quadrant collection (series 11). The drainage catheter was connected to a gravity bag and secured in place within interrupted suture and a Stat Lock device. Patient tolerated the above procedures well (though did require a generous amount of conscious sedation medications) without immediate postprocedural complication. IMPRESSION: 1. Successful CT guided placement of a 10 [place] all purpose drain catheter into the undrained pelvic abscess via right trans gluteal approach with aspiration of 65 mL of purulent fluid. Samples were sent to the laboratory as requested by the ordering clinical team. 2. Successful CT guided exchange and repositioning of pre-existing 12 French right anterior abdominal/pelvic drainage catheter with end now coiled and locked within the residual anteriorly located abscess cavity. Electronically Signed   By: Simonne Come M.D.   On: 02/10/2022 16:33   CT ABSCESS CATH EXCHANGE  Result Date: 02/10/2022 INDICATION: History of tubo-ovarian abscess, post CT-guided placement right lower abdominal/pelvic drainage catheter on 02/05/2022. Postprocedural CT scan performed 02/08/2022 demonstrates an unchanged fluid collection within the right lower pelvis and as such request made for placement of an additional percutaneous drainage catheter for infection source control purposes. Additionally, there is request for CT-guided exchange and/or repositioning of the pre-existing drainage  catheter into the more dominant component of the residual pelvic abscess EXAM: 1. CT GUIDED RIGHT TRANS GLUTEAL APPROACH TUBO-OVARIAN ABSCESS DRAINAGE CATHETER PLACEMENT 2. CT-GUIDED EXCHANGE AND REPOSITIONING OF EXISTING RIGHT LOWER ABDOMINAL/PELVIC DRAINAGE CATHETER COMPARISON:  CT abdomen pelvis-02/08/2022; 02/04/2022 CT guided right lower abdominal/pelvic abscess drainage catheter placement-02/05/2022 MEDICATIONS: The patient is currently admitted to the hospital and receiving intravenous antibiotics. The antibiotics were administered within an appropriate time frame prior to the initiation of the procedure. ANESTHESIA/SEDATION: Moderate (conscious) sedation was employed during this procedure as administered by the Interventional Radiology RN. A total of Versed 4 mg, Dilaudid 1 mg and Fentanyl 150 mcg was administered intravenously. Moderate Sedation Time: 60 minutes. The patient's level of consciousness and vital signs were monitored continuously by radiology nursing throughout the procedure under my direct supervision. CONTRAST:  None COMPLICATIONS: None immediate. PROCEDURE: RADIATION DOSE REDUCTION: This exam was performed according to the departmental dose-optimization program which includes automated exposure control, adjustment of the mA and/or kV according to patient size and/or use of iterative reconstruction technique. Informed written consent was obtained from the patient after a discussion of the risks, benefits and alternatives to treatment. The patient was initially placed prone on the CT gantry and a pre procedural CT was performed re-demonstrating the known abscess/fluid collection within the right hemipelvis with dominant component measuring approximately 5.5 x 5.4 cm (image 11, series 2). The procedure was planned. A timeout was performed prior to the initiation of the procedure. The skin overlying the right buttocks was prepped and draped in the usual sterile fashion. The overlying soft  tissues were anesthetized with 1% lidocaine with epinephrine. Appropriate trajectory was planned with the use of a 22 gauge spinal needle. An 18 gauge trocar needle was advanced into the abscess/fluid collection  and a short Amplatz super stiff wire was coiled within the collection. Appropriate positioning was confirmed with a limited CT scan. The tract was serially dilated allowing placement of a 10 [place] all-purpose drainage catheter. Appropriate positioning was confirmed with a limited postprocedural CT scan. Approximately 65 ml of purulent appearing serous fluid was aspirated. The tube was connected to a drainage bag and secured in place within interrupted suture and a Stat Lock device. A dressing was applied. The patient was then repositioned supine on the CT gantry and noncontrast images were obtained of the lower pelvis and pre-existing right anterior approach lower abdominal/pelvic drainage catheter. Attempts were made to retract the existing drainage catheter into the dominant component of the residual collection however this proved uncomfortable for the patient. As such, the external portion of the drainage catheter was prepped and draped in the usual sterile fashion. The external portion of the drainage catheter was cut and cannulated with a short Amplatz wire. Existing 12 French drainage catheter was then exchanged for a new 12 French percutaneous catheter which under intermittent CT guidance was repositioned into the dominant residual component of the right lower quadrant collection (series 11). The drainage catheter was connected to a gravity bag and secured in place within interrupted suture and a Stat Lock device. Patient tolerated the above procedures well (though did require a generous amount of conscious sedation medications) without immediate postprocedural complication. IMPRESSION: 1. Successful CT guided placement of a 10 [place] all purpose drain catheter into the undrained pelvic abscess via  right trans gluteal approach with aspiration of 65 mL of purulent fluid. Samples were sent to the laboratory as requested by the ordering clinical team. 2. Successful CT guided exchange and repositioning of pre-existing 12 French right anterior abdominal/pelvic drainage catheter with end now coiled and locked within the residual anteriorly located abscess cavity. Electronically Signed   By: Simonne Come M.D.   On: 02/10/2022 16:33   CT ABDOMEN PELVIS W CONTRAST  Result Date: 02/08/2022 CLINICAL DATA:  Sepsis EXAM: CT ABDOMEN AND PELVIS WITH CONTRAST TECHNIQUE: Multidetector CT imaging of the abdomen and pelvis was performed using the standard protocol following bolus administration of intravenous contrast. RADIATION DOSE REDUCTION: This exam was performed according to the departmental dose-optimization program which includes automated exposure control, adjustment of the mA and/or kV according to patient size and/or use of iterative reconstruction technique. CONTRAST:  OMNIPAQUE IOHEXOL 300 MG/ML  SOLN COMPARISON:  Previous studies including the CT done on 2022-02-10 FINDINGS: Lower chest: Small bilateral pleural effusions are seen more so on the right side. There are linear patchy infiltrates in the lingula and both lower lobes suggesting atelectasis/pneumonia. Hepatobiliary: No focal abnormality is seen in the liver. There is no dilation of bile ducts. Gallbladder is unremarkable. Small ascites is noted in the perihepatic region. Pancreas: No focal abnormality is seen. Spleen: Unremarkable. Adrenals/Urinary Tract: Adrenals are not enlarged. There is no hydronephrosis. There are no renal or ureteral stones. Urinary bladder is unremarkable. There is 3 mm low-density in the midportion of left kidney which is too small to be characterized. Stomach/Bowel: Stomach is not distended. Small bowel loops are not dilated. Appendix is not seen. There is no significant wall thickening in the colon. There is interval  placement of percutaneous drainage catheter with its tip in the right lower abdomen. There is interval decrease in size of the complex fluid collection in the right lower quadrant. There is 4 cm loculated fluid collection slightly to the left of midline at  the level of pelvic inlet. There is 7.2 x 5.3 x 4.9 cm loculated fluid collection with minimally enhancing margin in the cul-de-sac in the pelvis. There are no pockets of air within these loculated fluid collections. There is stranding in the fat planes in the lower abdomen and pelvis. Vascular/Lymphatic: Vascular structures are unremarkable. Reproductive: Uterus is to the right of midline. Other: There is no pneumoperitoneum. Musculoskeletal: No acute findings are seen. IMPRESSION: Interval placement of percutaneous drainage catheter within air-fluid collection in the right lower abdomen with interval decrease in size. There is 7.2 cm loculated fluid collection in the cul-de-sac in the pelvis with slightly enhancing margins. There is another 4 cm loculated fluid collection in the left side of the pelvis. These may suggest loculated serous fluid collections or abscesses. Small ascites is noted in the perihepatic region. There is no evidence of intestinal obstruction or pneumoperitoneum. There is no hydronephrosis. Small bilateral pleural effusions. There are linear patchy infiltrates in both lower lung fields suggesting atelectasis/pneumonia. Electronically Signed   By: Ernie Avena M.D.   On: 02/08/2022 09:41   CT IMAGE GUIDED DRAINAGE BY PERCUTANEOUS CATHETER  Result Date: 02/05/2022 INDICATION: PID with large abdominopelvic fluid collection. EXAM: CT GUIDED DRAINAGE CATHETER PLACEMENT INTO ABDOMINOPELVIC FLUID COLLECTION RADIATION DOSE REDUCTION: This exam was performed according to the departmental dose-optimization program which includes automated exposure control, adjustment of the mA and/or kV according to patient size and/or use of iterative  reconstruction technique. COMPARISON:  CT AP, 02/04/2022 MEDICATIONS: The patient is currently admitted to the hospital and receiving intravenous antibiotics. The antibiotics were administered within an appropriate time frame prior to the initiation of the procedure. ANESTHESIA/SEDATION: Moderate (conscious) sedation was employed during this procedure. A total of Versed 3 mg and Fentanyl 75 mcg was administered intravenously. Additionally, 1 mg Dilaudid was administered IV. Moderate Sedation Time: 19 minutes. The patient's level of consciousness and vital signs were monitored continuously by radiology nursing throughout the procedure under my direct supervision. CONTRAST:  None COMPLICATIONS: None immediate. PROCEDURE: Informed written consent was obtained from the patient and/or patient's representative after a discussion of the risks, benefits and alternatives to treatment. The patient was placed supine on the CT gantry and a pre procedural CT was performed re-demonstrating the known abscess/fluid collection within the lower abdomen/pelvis. The procedure was planned. A timeout was performed prior to the initiation of the procedure. The RIGHT lower quadrant was prepped and draped in the usual sterile fashion. The overlying soft tissues were anesthetized with 1% lidocaine with epinephrine. Appropriate trajectory was planned with the use of a 22 gauge spinal needle. An 18 gauge trocar needle was advanced into the abscess/fluid collection and a short Amplatz super stiff wire was coiled within the collection. Appropriate positioning was confirmed with a limited CT scan. The tract was serially dilated allowing placement of a 12 [place] all-purpose drainage catheter. Appropriate positioning was confirmed with a limited postprocedural CT scan. 5 mL ml of serous fluid was aspirated and submitted for analysis. The tube was connected to a drainage bag and sutured in place. A dressing was placed. The patient tolerated the  procedure well without immediate post procedural complication. IMPRESSION: Successful CT guided placement of a 12 Fr drainage catheter into the abdominopelvic collection with aspiration of serous fluid, as above. Samples were sent to the laboratory as requested by the ordering clinical team. Roanna Banning, MD [facility] Electronically Signed   By: Roanna Banning M.D.   On: 02/05/2022  18:08   CT ABDOMEN PELVIS WO CONTRAST  Result Date: 02/04/2022 CLINICAL DATA:  Abdominal pain, acute, nonlocalized septic shock, abnormal uterine US, concern for PID EXAM: CT ABDOMEN AND PELVIS WITHOUT CONTRAST TECHNIQUE: Multidetector CT imaging of the abdomen and pelvis was performed following the standard protocol without IV contrast. RADIATION DOSE REDUCTION: This exam was performed according to the departmental dose-optimization program which includes automated exposure control, adjustment of the mA and/or kV according to patient size and/or use of iterative reconstruction technique. COMPARISON:  Pelvic ultrasound today FINDINGS: Lower chest: Pleural thickening noted peripherally in the right lower hemithorax, possibly representing trace loculated pleural effusion. No confluent airspace opacities. Hepatobiliary: No focal hepatic abnormality. Gallbladder unremarkable. Pancreas: No focal abnormality or ductal dilatation. Spleen: No focal abnormality.  Normal size. Adrenals/Urinary Tract: No adrenal abnormality. No focal renal abnormality. No stones or hydronephrosis. Urinary bladder is unremarkable. Stomach/Bowel: Wall thickening noted in the cecum which is immediately adjacent to the large pelvic fluid collection described below. This could reflect colitis or be secondarily inflamed. Stomach and small bowel grossly unremarkable. Vascular/Lymphatic: No evidence of aneurysm or adenopathy. Reproductive: Large complex cystic area or fluid collection seen in the pelvis extending  from the cul-de-sac superiorly into the right lower quadrant adjacent to the cecum. This measures approximately 12 x 8 cm on image 64 of series 3. Internal areas of increased density. This could reflect blood or abscess/PID. Uterus grossly unremarkable on this noncontrast study. Ovaries not definitively visualized. Other: There is free fluid adjacent to the liver and spleen. Musculoskeletal: No acute bony abnormality. IMPRESSION: Complex process in the pelvis including what appears to be a large complex fluid collection measuring 12 x 8 cm extending from the cul-de-sac superiorly to the cecum which could reflect blood products or abscess. This also could conceivably be related to and ovary. Neither ovary definitively identified. Wall thickening in the cecum could reflect colitis or secondary inflammation related to the pelvic process. Perihepatic and perisplenic ascites. Possible trace loculated right pleural effusion peripherally in the right lower hemithorax. Electronically Signed   By: Charlett Nose M.D.   On: 02/04/2022 22:29   US PELVIC COMPLETE WITH TRANSVAGINAL  Addendum Date: 02/04/2022   ADDENDUM REPORT: 02/04/2022 13:44 ADDENDUM: In light of negative quantitative beta HCG and no documented pregnancy the possibility of infectious causes for the above findings may be more likely but findings are essentially quite nonspecific at this time. Even noncontrast CT could prove beneficial to allow for assessment of pelvic fluid to determine whether this represents frank hemoperitoneum and to assess for any associated inflammatory changes that may be present not well assessed on ultrasound. These results were called by telephone at the time of interpretation on 02/04/2022 at 1:44 pm to provider [facility] , who verbally acknowledged these results. Electronically Signed   By: Donzetta Kohut M.D.   On: 02/04/2022 13:44   Result Date: 02/04/2022 CLINICAL DATA:  A 31 year old female presents with pelvic pain and  bleeding. Patient reports positive pregnancy but urine pregnancy currently negative by report and beta HCG is currently pending. EXAM: TRANSABDOMINAL AND TRANSVAGINAL ULTRASOUND OF PELVIS TECHNIQUE: Both transabdominal and transvaginal ultrasound examinations of the pelvis were performed. Transabdominal technique was performed for global imaging of the pelvis including uterus, ovaries, adnexal regions, and pelvic cul-de-sac. It was necessary to proceed with endovaginal exam following the transabdominal exam to visualize the endometrium and adnexa. COMPARISON:  No recent imaging is available for comparison. FINDINGS: Uterus Measurements: 12.6 x 8.0 x 8.5 (volume =  450 cc) cm. = volume: 450 mL. Heterogeneous uterus with thickened endometrium. Cystic area in the endometrial canal that measures 4.5 x 2.6 x 4.4 cm. This cystic characteristics and central increased echogenicity. Central increased echogenicity the without discrete finding that would suggest embryo with very limited assessment due to patient discomfort and clinical condition. Endometrium Thickness: 3.3 cm. Thickened with cystic area in the fundus of the uterus. Right ovary Measurements: 3.2 x 2.7 x 2.1 (volume = 9.5 cc) cm = volume: 9.5 mL. Normal appearance/no adnexal mass. Left ovary Not visible, in the area of the LEFT adnexa is heterogeneous material that has the appearance of blood products but conforms to the cul-de-sac and LEFT adnexa. Other findings Moderate heterogeneous fluid in the LEFT adnexa primarily but also tracking along the RIGHT uterus. IMPRESSION: Unusual cystic area in the endometrial canal. If this patient has any history of recent positive pregnancy test this could represent a failed gestation with retained products of conception. Pseudo gestational sac with areas of internal hemorrhage in the setting of ruptured ectopic pregnancy is also considered. Heterogeneous appearance of material in the cul-de-sac could also potentially  represent sequela of pyogenic infection though this is lower on the differential at this time based on appearance. If the patient could tolerate further evaluation with cross-sectional imaging CT could potentially be helpful. Ultimately gyn consultation is suggested. Critical Value/emergent results were called by telephone at the time of interpretation on 02/04/2022 at 11:01 am to provider [facility] , who verbally acknowledged these results. Electronically Signed: By: Donzetta KohutGeoffrey  Wile M.D. On: 02/04/2022 11:01   DG Chest Port 1 View  Result Date: 02/04/2022 CLINICAL DATA:  31 year old female with history of lower abdominal pain and vaginal bleeding 3 days ago. Fever. Low blood pressure. EXAM: PORTABLE CHEST 1 VIEW COMPARISON:  Chest x-ray 08/09/2021. FINDINGS: Lung volumes are slightly low. No consolidative airspace disease. No pleural effusions. No pneumothorax. No evidence of pulmonary edema. Heart size is normal. The patient is rotated to the right on today's exam, resulting in distortion of the mediastinal contours and reduced diagnostic sensitivity and specificity for mediastinal pathology. IMPRESSION: 1. No radiographic evidence of acute cardiopulmonary disease. Electronically Signed   By: Trudie Reedaniel  Entrikin M.D.   On: 02/04/2022 10:55    Scheduled Meds:  alum & mag hydroxide-simeth  30 mL Oral Once   And   lidocaine  15 mL Oral Once   docusate sodium  100 mg Oral BID   doxycycline  100 mg Oral Q12H   DULoxetine  20 mg Oral Daily   enoxaparin (LOVENOX) injection  40 mg Subcutaneous Q24H   feeding supplement  237 mL Oral BID BM   gabapentin  300 mg Oral TID   ketorolac  15 mg Intravenous Q6H   metroNIDAZOLE  500 mg Oral Q12H   multivitamin with minerals  1 tablet Oral Daily   nicotine  21 mg Transdermal Daily   oxyCODONE  20 mg Oral Q12H   polyethylene glycol  17 g Oral Daily   potassium chloride  80 mEq Oral BID   sodium chloride flush  3 mL Intravenous Q12H   sodium chloride flush  5  mL Intracatheter Q8H   traZODone  50 mg Oral QHS   Continuous Infusions:  cefTRIAXone (ROCEPHIN)  IV 2 g (02/17/22 1528)   lactated ringers     vancomycin 1,000 mg (02/18/22 0959)   PRN Meds:bisacodyl, diphenhydrAMINE **OR** diphenhydrAMINE, HYDROmorphone (DILAUDID) injection, hydrOXYzine, lip balm, menthol-cetylpyridinium, ondansetron **OR** ondansetron (ZOFRAN) IV, polyvinyl alcohol,  simethicone, sodium chloride flush    LOS: 9 days   Reva Bores, MD 02/18/2022 11:44 AM

## 2022-02-18 NOTE — Progress Notes (Signed)
Physical Therapy Treatment Patient Details Name: Paula Lucas MRN: [id number] DOB: Jun 19, 1991 Today's Date: 02/18/2022   History of Present Illness Pt is a 31 y/o female admitted 6/19 secondary to worsening abdominal pain. Pt left AMA on 6/18 from ICU for pelvic abscess and had drain placement during that admission. Pt reports she was [redacted] weeks pregnant at that time. Found to have tuboovarian abscess and pelvic abscess. Pt is s/p second drain placement and exchange and repositioning of first drain. PMH includes STIs.    PT Comments    Pt was seen for mobility but again declines OOB.  Pt did agree to there exercises to LE's and did demonstrate a reasonable tolerance for all but knee flexion which PT had to assist her to perform.  Pt discussed with PT the need for more mobility including being OOB to chair.  Pt agreed to try this and will anticipate two person help needed to get to the chair tomorrow, mainly for support of the initial transition.  Pt has been instructed in a HEP to do for preparation to get OOB.  Follow up for all goals of acute PT.   Recommendations for follow up therapy are one component of a multi-disciplinary discharge planning process, led by the attending physician.  Recommendations may be updated based on patient status, additional functional criteria and insurance authorization.  Follow Up Recommendations  Home health PT     Assistance Recommended at Discharge Intermittent Supervision/Assistance  Patient can return home with the following Help with stairs or ramp for entrance;Assist for transportation;Assistance with cooking/housework;A little help with bathing/dressing/bathroom   Equipment Recommendations  Rolling walker (2 wheels)    Recommendations for Other Services       Precautions / Restrictions Precautions Precautions: Fall Restrictions Weight Bearing Restrictions: No     Mobility  Bed Mobility Overal bed mobility: Needs Assistance              General bed mobility comments: pt did not want to get OOB but adjusts her posture in the bed without help    Transfers                   General transfer comment: declines to attempt    Ambulation/Gait                   Stairs             Wheelchair Mobility    Modified Rankin (Stroke Patients Only)       Balance                                            Cognition Arousal/Alertness: Awake/alert Behavior During Therapy: Flat affect Overall Cognitive Status: No family/caregiver present to determine baseline cognitive functioning                                 General Comments: pt initially moved with great reluctance and began to initiate more effort with exercises        Exercises General Exercises - Lower Extremity Ankle Circles/Pumps: AAROM, Both, 5 reps Quad Sets: AROM, 10 reps Gluteal Sets: AROM, 10 reps Hip ABduction/ADduction: AROM, 10 reps Hip Flexion/Marching: AAROM, 10 reps    General Comments        Pertinent Vitals/Pain Pain Assessment Pain Assessment: Faces  Faces Pain Scale: Hurts even more Pain Location: surgery drains Pain Descriptors / Indicators: Guarding Pain Intervention(s): Monitored during session, Repositioned, Premedicated before session    Home Living                          Prior Function            PT Goals (current goals can now be found in the care plan section) Acute Rehab PT Goals Patient Stated Goal: to decrease pain Progress towards PT goals: Not progressing toward goals - comment    Frequency    Min 3X/week      PT Plan Current plan remains appropriate    Co-evaluation              AM-PAC PT "6 Clicks" Mobility   Outcome Measure  Help needed turning from your back to your side while in a flat bed without using bedrails?: None Help needed moving from lying on your back to sitting on the side of a flat bed without using bedrails?: A  Little Help needed moving to and from a bed to a chair (including a wheelchair)?: A Little Help needed standing up from a chair using your arms (e.g., wheelchair or bedside chair)?: A Little Help needed to walk in hospital room?: A Little Help needed climbing 3-5 steps with a railing? : A Lot 6 Click Score: 18    End of Session   Activity Tolerance: Patient limited by pain Patient left: in bed;with call bell/phone within reach;with bed alarm set Nurse Communication: Mobility status PT Visit Diagnosis: Other abnormalities of gait and mobility (R26.89);Pain;Muscle weakness (generalized) (M62.81);Difficulty in walking, not elsewhere classified (R26.2) Pain - Right/Left: Left Pain - part of body:  (abdomen)     Time: 6283-1517 PT Time Calculation (min) (ACUTE ONLY): 13 min  Charges:  $Therapeutic Exercise: 8-22 mins    Ivar Drape 02/18/2022, 3:34 PM  Samul Dada, PT PhD Acute Rehab Dept. Number: [facility] [id number] and [facility] [phone]

## 2022-02-18 NOTE — Progress Notes (Signed)
Patient ID: Paula Lucas, female   DOB: [birth year], 31 y.o.   MRN: [id number] 2 Days Post-Op    Subjective: No new complaints, reports abd pain is mildy better but still sore. +flatus, denies BM. States she isnt eating much.   Objective: Vital signs in last 24 hours: Temp:  [98.2 F (36.8 C)-99.3 F (37.4 C)] 99.3 F (37.4 C) (06/29 0808) Pulse Rate:  [100-113] 113 (06/29 0808) Resp:  [20-22] 22 (06/29 0808) BP: (129-130)/(94-96) 129/94 (06/29 0808) SpO2:  [90 %] 90 % (06/28 2007) Last BM Date : 02/12/22  Intake/Output from previous day: 06/28 0701 - 06/29 0700 In: 410 [IV Piggyback:400] Out: 750 [Urine:700; Drains:50] Intake/Output this shift: Total I/O In: -  Out: 40 [Drains:40]  GI: soft, actually less tender overall but very sore at incision ; wouldn't let me examine incision. JPx2 with SS drainge  Lab Results: CBC  Recent Labs    02/17/22 0555 02/18/22 0154  WBC 17.1* 12.9*  HGB 9.3* 8.4*  HCT 27.8* 25.6*  PLT 713* 649*   BMET Recent Labs    02/17/22 0555 02/18/22 0154  NA 139 141  K 3.9 3.2*  CL 101 104  CO2 27 29  GLUCOSE 104* 94  BUN 5* 5*  CREATININE 0.70 0.80  CALCIUM 8.6* 8.4*   PT/INR No results for input(s): "LABPROT", "INR" in the last 72 hours. ABG No results for input(s): "PHART", "HCO3" in the last 72 hours.  Invalid input(s): "PCO2", "PO2"  Studies/Results: No results found.  Anti-infectives: Anti-infectives (From admission, onward)    Start     Dose/Rate Route Frequency Ordered Stop   02/17/22 1130  fluconazole (DIFLUCAN) tablet 200 mg        200 mg Paula Daily 02/17/22 1032 02/17/22 1145   02/13/22 2200  vancomycin (VANCOCIN) IVPB 1000 mg/200 mL premix        1,000 mg 200 mL/hr over 60 Minutes Intravenous Every 12 hours 02/13/22 0817     02/13/22 0900  vancomycin (VANCOREADY) IVPB 1750 mg/350 mL        1,750 mg 175 mL/hr over 120 Minutes Intravenous  Once 02/13/22 0809 02/13/22 1258   02/12/22 1315  doxycycline  (VIBRA-TABS) tablet 100 mg        100 mg Paula Every 12 hours 02/12/22 1220     02/12/22 1000  doxycycline (VIBRA-TABS) tablet 100 mg  Status:  Discontinued        100 mg Paula Daily 02/12/22 0804 02/12/22 1219   02/11/22 1345  doxycycline (VIBRA-TABS) tablet 100 mg        100 mg Paula  Once 02/11/22 1255 02/11/22 1346   02/11/22 1345  metroNIDAZOLE (FLAGYL) tablet 500 mg        500 mg Paula Every 12 hours 02/11/22 1255     02/09/22 2051  metroNIDAZOLE (FLAGYL) IVPB 500 mg  Status:  Discontinued        500 mg 100 mL/hr over 60 Minutes Intravenous Every 12 hours 02/09/22 1106 02/11/22 1255   02/09/22 1600  cefTRIAXone (ROCEPHIN) 2 g in sodium chloride 0.9 % 100 mL IVPB        2 g 200 mL/hr over 30 Minutes Intravenous Every 24 hours 02/09/22 1147     02/08/22 1700  metroNIDAZOLE (FLAGYL) IVPB 500 mg  Status:  Discontinued        500 mg 100 mL/hr over 60 Minutes Intravenous Every 8 hours 02/08/22 1205 02/09/22 1106   02/08/22 1630  ceFEPIme (MAXIPIME) 2 g in  sodium chloride 0.9 % 100 mL IVPB  Status:  Discontinued        2 g 200 mL/hr over 30 Minutes Intravenous Every 8 hours 02/08/22 0822 02/09/22 1147   02/08/22 0900  doxycycline (VIBRAMYCIN) 100 mg in sodium chloride 0.9 % 250 mL IVPB  Status:  Discontinued        100 mg 125 mL/hr over 120 Minutes Intravenous 2 times daily 02/08/22 0811 02/11/22 1255   02/08/22 0815  ceFEPIme (MAXIPIME) 2 g in sodium chloride 0.9 % 100 mL IVPB        2 g 200 mL/hr over 30 Minutes Intravenous  Once 02/08/22 0811 02/08/22 0855   02/08/22 0815  metroNIDAZOLE (FLAGYL) IVPB 500 mg        500 mg 100 mL/hr over 60 Minutes Intravenous  Once 02/08/22 0811 02/08/22 1147       Assessment/Plan: PID Multiple intraabdominal abscesses - S/P ex lap, abdominal washout with drainage of abscesses, repair SB serosa X 3 in conjunction with Dr. Shawnie Pons 6/27. JPs are serosanguinous, afebrile, WBC downtrending.   ID - cultures taken in OR (NGTD), currently rocephin/  flagyl/doxycycline/ vancomycin VTE - lovenox FEN - IVF, Reg  Foley - none   Depression Iron deficiency anemia Obesity BMI 30.03  LOS: 9 days    Hosie Spangle, PA-C Trauma & General Surgery Use [email or web address] to contact on call provider  02/18/2022

## 2022-02-18 NOTE — Progress Notes (Signed)
Peripherally Inserted Central Catheter Placement  The IV Nurse has discussed with the patient and/or persons authorized to consent for the patient, the purpose of this procedure and the potential benefits and risks involved with this procedure.  The benefits include less needle sticks, lab draws from the catheter, and the patient may be discharged home with the catheter. Risks include, but not limited to, infection, bleeding, blood clot (thrombus formation), and puncture of an artery; nerve damage and irregular heartbeat and possibility to perform a PICC exchange if needed/ordered by physician.  Alternatives to this procedure were also discussed.  Bard Power PICC patient education guide, fact sheet on infection prevention and patient information card has been provided to patient /or left at bedside.    PICC Placement Documentation  PICC Single Lumen 02/18/22 Right Brachial 33 cm 0 cm (Active)  Indication for Insertion or Continuance of Line Prolonged intravenous therapies 02/18/22 1730  Exposed Catheter (cm) 0 cm 02/18/22 1730  Site Assessment Clean, Dry, Intact 02/18/22 1730  Line Status Flushed;Saline locked;Blood return noted 02/18/22 1730  Dressing Type Transparent;Securing device 02/18/22 1730  Dressing Status Antimicrobial disc in place;Clean, Dry, Intact 02/18/22 1730  Dressing Intervention New dressing 02/18/22 1730  Dressing Change Due 02/25/22 02/18/22 1730       Curt Jews 02/18/2022, 5:32 PM

## 2022-02-18 NOTE — Progress Notes (Signed)
Peripherally Inserted Central Catheter Exchange  PICC exchanged due to placement of initial PICC as seen on chest xray.   PICC Placement Documentation  PICC Single Lumen 02/18/22 Right Basilic 35 cm 1 cm (Active)  Indication for Insertion or Continuance of Line Poor Vasculature-patient has had multiple peripheral attempts or PIVs lasting less than 24 hours 02/18/22 1918  Exposed Catheter (cm) 1 cm 02/18/22 1918  Site Assessment Clean, Dry, Intact 02/18/22 1918  Line Status Flushed;Saline locked;Blood return noted 02/18/22 1918  Dressing Type Transparent;Securing device 02/18/22 1918  Dressing Status Antimicrobial disc in place 02/18/22 1918  Safety Lock Not Applicable 02/18/22 1918  Line Care Connections checked and tightened 02/18/22 1918  Line Adjustment (NICU/IV Team Only) No 02/18/22 1918  Dressing Intervention New dressing 02/18/22 1918  Dressing Change Due 02/25/22 02/18/22 1918       Curt Jews 02/18/2022, 7:31 PM

## 2022-02-18 NOTE — Progress Notes (Signed)
Occupational Therapy Treatment Patient Details Name: Paula Lucas MRN: [id number] DOB: [birth year] Today's Date: 02/18/2022   History of present illness Pt is a 31 y/o female admitted 6/19 secondary to worsening abdominal pain. Pt left AMA on 6/18 from ICU for pelvic abscess and had drain placement during that admission. Pt reports she was [redacted] weeks pregnant at that time. Found to have tuboovarian abscess and pelvic abscess. Pt is s/p second drain placement and exchange and repositioning of first drain. PMH includes STIs.   OT comments  Pt in session became tearful and reporting they feel depressed. Pt required max encouragement to participate at this time to complete any movements at this time. Pt at this time attempted to complete supine to sitting on both sides of the bed was only able to to get feet to EOB. Pt was educated about the importance of attempting to mobilize in sessions. Occupational Therapy will continue to follow.    Recommendations for follow up therapy are one component of a multi-disciplinary discharge planning process, led by the attending physician.  Recommendations may be updated based on patient status, additional functional criteria and insurance authorization.    Follow Up Recommendations  Skilled nursing-short term rehab (<3 hours/day)    Assistance Recommended at Discharge Frequent or constant Supervision/Assistance  Patient can return home with the following  A lot of help with walking and/or transfers;A lot of help with bathing/dressing/bathroom;Assist for transportation   Equipment Recommendations  BSC/3in1;Tub/shower seat    Recommendations for Other Services      Precautions / Restrictions Precautions Precautions: Fall Restrictions Weight Bearing Restrictions: No       Mobility Bed Mobility Overal bed mobility: Needs Assistance             General bed mobility comments: attmepted to complete OOB activty but got as far as feet out of the  bed    Transfers                         Balance                                           ADL either performed or assessed with clinical judgement   ADL Overall ADL's : Needs assistance/impaired Eating/Feeding: Set up;Sitting   Grooming: Set up;Bed level   Upper Body Bathing: Moderate assistance;Cueing for safety;Cueing for sequencing;Bed level   Lower Body Bathing: Total assistance;Bed level   Upper Body Dressing : Minimal assistance;Bed level   Lower Body Dressing: Maximal assistance;Sit to/from stand                 General ADL Comments: Pt very limited in any mobility at this time and did not make it to OOB actvity    Extremity/Trunk Assessment Upper Extremity Assessment Upper Extremity Assessment: Generalized weakness   Lower Extremity Assessment Lower Extremity Assessment: Defer to PT evaluation        Vision   Vision Assessment?: No apparent visual deficits   Perception     Praxis      Cognition Arousal/Alertness: Awake/alert Behavior During Therapy: Flat affect Overall Cognitive Status: No family/caregiver present to determine baseline cognitive functioning                                 General Comments: Pt took  extra time to complete any tasks, increase in motivation to complete any activities. Pt is very hard on self on not able to complete activity but then also complaining about pain and fearful to move.        Exercises      Shoulder Instructions       General Comments      Pertinent Vitals/ Pain       Pain Assessment Pain Assessment: Faces Faces Pain Scale: Hurts even more Pain Location: sx site Pain Descriptors / Indicators: Grimacing, Guarding Pain Intervention(s): Limited activity within patient's tolerance, Monitored during session, Premedicated before session  Home Living                                          Prior Functioning/Environment               Frequency  Min 2X/week        Progress Toward Goals  OT Goals(current goals can now be found in the care plan section)  Progress towards OT goals: Goals drowngraded-see care plan  Acute Rehab OT Goals Patient Stated Goal: to be able to move OT Goal Formulation: With patient Time For Goal Achievement: 02/27/22 Potential to Achieve Goals: Fair ADL Goals Pt Will Perform Grooming: with modified independence;sitting Pt Will Perform Lower Body Bathing: with modified independence;sitting/lateral leans;sit to/from stand Pt Will Perform Lower Body Dressing: with modified independence;sitting/lateral leans;sit to/from stand Pt Will Transfer to Toilet: with modified independence;bedside commode Pt Will Perform Toileting - Clothing Manipulation and hygiene: with modified independence;sit to/from stand;sitting/lateral leans  Plan Discharge plan remains appropriate    Co-evaluation                 AM-PAC OT "6 Clicks" Daily Activity     Outcome Measure   Help from another person eating meals?: A Little Help from another person taking care of personal grooming?: A Little Help from another person toileting, which includes using toliet, bedpan, or urinal?: A Lot Help from another person bathing (including washing, rinsing, drying)?: A Lot Help from another person to put on and taking off regular upper body clothing?: A Little Help from another person to put on and taking off regular lower body clothing?: A Lot 6 Click Score: 15    End of Session    OT Visit Diagnosis: Unsteadiness on feet (R26.81);Other abnormalities of gait and mobility (R26.89);Muscle weakness (generalized) (M62.81)   Activity Tolerance Other (comment) (level of motivation to complete tasks)   Patient Left in bed;with call bell/phone within reach;with bed alarm set   Nurse Communication  (particiaption)        Time: 2878-6767 OT Time Calculation (min): 44 min  Charges: OT General Charges $OT  Visit: 1 Visit OT Treatments $Self Care/Home Management : 38-52 mins  Alphia Moh OTR/L  Acute Rehab Services  [phone] office number [phone] pager number   Alphia Moh 02/18/2022, 11:47 AM

## 2022-02-18 NOTE — Progress Notes (Addendum)
Overnight: No acute events overnight   Subjective: Patient seen and evaluated bedside. Significant pain last night. Right sided pain. Frustrated unable to mobilize well. Less appetite today. Has not had a BM in 2 days. Endorsing some chills all night.  Productive cough.     Objective:  Vital signs in last 24 hours: Vitals:   02/17/22 2007 02/17/22 2357 02/18/22 0808 02/18/22 1218  BP: (!) 130/96 (!) 130/95 (!) 129/94 121/85  Pulse: 100 (!) 101 (!) 113   Temp: 98.4 F (36.9 C) 98.2 F (36.8 C) 99.3 F (37.4 C) 99.2 F (37.3 C)  Resp: (!) 22 20 (!) 22 20  Height:      Weight:      SpO2: 90%   94%  TempSrc: Oral Oral Oral Oral  BMI (Calculated):       Constitutional:      General: She is not in acute distress.  Resting in bed, appears more comfortable than yesterday. HENT:     Head: Normocephalic and atraumatic.  Cardiovascular:     Rate and Rhythm: Tachycardic    Heart sounds: Normal heart sounds.  Pulmonary:     Effort: Pulmonary effort is normal.  Tachypneic Abdominal:     Comments: Tenderness to palpation diffusely, most prominent on right side, midline surgical incision with dressing in place, no strikethrough, 2 JP drains right lower abdomen with serosanguineous output Musculoskeletal:     Right lower leg: No edema.     Left lower leg: No edema.  Skin:    General: Diaphoretic Neurological:     General: No focal deficit present.     Mental Status: She is alert.     Motor: Motor function is intact.  Psychiatric:        Behavior: Behavior normal. Behavior is cooperative.        Cognition and Memory: Cognition normal.   Assessment/Plan:  Principal Problem:   Pelvic abscess  Active Problems:   Sepsis (HCC)   Left lower quadrant abdominal pain   MRSA (methicillin resistant staph aureus) culture positive   Hepatic abscess  Ms. Cieslewicz is a 31 year old female with a past medical history of recent admission for tubo-ovarian abscess complicated by septic shock  (02/04/22 - 02/07/22) with previous history of tubo-ovarian abscess 2/2 N. Gonorrhea in 2019 who presents to the ED with c/o nausea, vomiting and abdominal pain, currently admitted for multiple pelvic abscesses.   Multiple pelvic abscesses Patient is 2 day postop status post ex lap with washout and bowel adhesion lysis.  3 serosal tears were repaired as well.  Abdomen remains distended and tense, tenderness has improved somewhat still diffusely tender, most prominent on the right side. Today patient's heart rate is in the 100s, and she seems to have continued distention of her abdomen.  KUB has been ordered and we will follow this up.  Fortunately she has remained afebrile, and leukocytosis is improving.  Per OB, if she requires additional surgery would need to consult GYN ONC. - Midline surgical incision remains open with bandages in place and no strikethrough.  Ideally would have wound VAC placed, however patient refused this secondary to pain despite receiving pain medicine.   If patient requires additional surgery will not need to have wound VAC placed, however she is agreeable to having it done if no further surgery is needed. -Patient is receiving OxyContin 20 mg twice daily, we have decreased her Dilaudid from every 4 hours as needed to every 6 hours as needed.  Would like for  her to receive Toradol prior to receiving a dose of Dilaudid if able.  We have also scheduled her Toradol.  In addition to this we are increasing gabapentin to help control her pain.  She did report some rib pain for which we have added on lidocaine patches as well as throat pain for which we have added on viscous lidocaine solution. - We will continue antibiotics for now given abscesses that were not able to be reached during ex lap - Patient had previously reported back pain prior to undergoing ex lap.  Given her history of MRSA bacteriuria, and suspected occult bacteremia as well as her multiple pelvic abscesses, there is  concern that she may have possibly seeded additional areas of infection.  We will further evaluate this new back pain with MRI as she tolerates.  Patient was scheduled to undergo MRI last night but could not tolerate secondary to pain.  We will continue to encourage her to have this done while she is inpatient, however if she does not she will require very close follow-up.  Can give Ativan prior to exam if necessary.  Candida vaginitis Patient reporting significant amount of white discharge that she states she cleaned herself last night.  She does endorse a history of prior yeast infections and says this feels similar.  Likely related to patient's ongoing antibiotic use. - Diflucan 200 mg daily.   Iron deficiency anemia Hemoglobin stable at 9.1; no active signs of bleeding.  We will hold off on iron supplementation in the setting of acute infection - Transfuse if hemoglobin is less than 7 - Continue to monitor for active signs of bleeding  Depression relating to medical illness Patient reporting she has been feeling down and depressed related to her infection, slow progress and length of stay in the hospital.  She states that she does not want family to visit her because she does not want them to see her in her current state.  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  She is amenable to meeting with spiritual counselor. -Patient has met with chaplain and reports significant benefit from this. - She has been started on trazodone nightly to assist with mood and trouble sleeping.   Barriers to Discharge: Medical stability  Delene Ruffini, MD 02/15/2022, 11:48 AM Pager: [phone] After 5pm on weekdays and 1pm on weekends: On Call pager [phone]

## 2022-02-19 ENCOUNTER — Inpatient Hospital Stay (HOSPITAL_COMMUNITY): Payer: Medicaid Other

## 2022-02-19 DIAGNOSIS — K750 Abscess of liver: Secondary | ICD-10-CM

## 2022-02-19 DIAGNOSIS — E46 Unspecified protein-calorie malnutrition: Secondary | ICD-10-CM | POA: Diagnosis present

## 2022-02-19 LAB — CBC
HCT: 25.3 % — ABNORMAL LOW (ref 36.0–46.0)
Hemoglobin: 8.2 g/dL — ABNORMAL LOW (ref 12.0–15.0)
MCH: 28.9 pg (ref 26.0–34.0)
MCHC: 32.4 g/dL (ref 30.0–36.0)
MCV: 89.1 fL (ref 80.0–100.0)
Platelets: 611 10*3/uL — ABNORMAL HIGH (ref 150–400)
RBC: 2.84 MIL/uL — ABNORMAL LOW (ref 3.87–5.11)
RDW: 17.4 % — ABNORMAL HIGH (ref 11.5–15.5)
WBC: 9.7 10*3/uL (ref 4.0–10.5)
nRBC: 0 % (ref 0.0–0.2)

## 2022-02-19 LAB — GLUCOSE, CAPILLARY: Glucose-Capillary: 98 mg/dL (ref 70–99)

## 2022-02-19 LAB — BASIC METABOLIC PANEL
Anion gap: 11 (ref 5–15)
BUN: 6 mg/dL (ref 6–20)
CO2: 29 mmol/L (ref 22–32)
Calcium: 8.5 mg/dL — ABNORMAL LOW (ref 8.9–10.3)
Chloride: 100 mmol/L (ref 98–111)
Creatinine, Ser: 0.79 mg/dL (ref 0.44–1.00)
GFR, Estimated: >60 mL/min (ref >60)
Glucose, Bld: 90 mg/dL (ref 70–99)
Potassium: 3.8 mmol/L (ref 3.5–5.1)
Sodium: 140 mmol/L (ref 135–145)

## 2022-02-19 LAB — PHOSPHORUS: Phosphorus: 4.4 mg/dL (ref 2.5–4.6)

## 2022-02-19 LAB — MAGNESIUM: Magnesium: 1.7 mg/dL (ref 1.7–2.4)

## 2022-02-19 LAB — PREALBUMIN: Prealbumin: 7.9 mg/dL — ABNORMAL LOW (ref 18–38)

## 2022-02-19 MED ORDER — POLYETHYLENE GLYCOL 3350 17 G PO PACK
17.0000 g | PACK | Freq: Two times a day (BID) | ORAL | Status: DC
  Filled 2022-02-19: qty 1

## 2022-02-19 MED ORDER — SODIUM CHLORIDE 0.9 % IV SOLN
250.0000 mg | Freq: Every day | INTRAVENOUS | Status: AC
Start: 2022-02-19 — End: 2022-02-23
  Administered 2022-02-19 – 2022-02-22 (×4): 250 mg via INTRAVENOUS
  Filled 2022-02-19 (×4): qty 20

## 2022-02-19 MED ORDER — SENNOSIDES-DOCUSATE SODIUM 8.6-50 MG PO TABS
1.0000 | ORAL_TABLET | Freq: Every day | ORAL | Status: DC
  Administered 2022-02-19: 1 via ORAL
  Filled 2022-02-19: qty 1

## 2022-02-19 MED ORDER — LINEZOLID 600 MG PO TABS
600.0000 mg | ORAL_TABLET | Freq: Two times a day (BID) | ORAL | Status: DC
  Administered 2022-02-19 – 2022-02-21 (×4): 600 mg via ORAL
  Filled 2022-02-19 (×6): qty 1

## 2022-02-19 MED ORDER — PROSOURCE TF PO LIQD
45.0000 mL | Freq: Two times a day (BID) | ORAL | Status: DC
  Administered 2022-02-19 – 2022-02-25 (×10): 45 mL
  Filled 2022-02-19 (×13): qty 45

## 2022-02-19 MED ORDER — KETOROLAC TROMETHAMINE 15 MG/ML IJ SOLN
15.0000 mg | Freq: Once | INTRAMUSCULAR | Status: AC
Start: 2022-02-19 — End: 2022-02-19
  Administered 2022-02-19: 15 mg via INTRAVENOUS
  Filled 2022-02-19: qty 1

## 2022-02-19 MED ORDER — OSMOLITE 1.5 CAL PO LIQD
1000.0000 mL | ORAL | Status: DC
  Administered 2022-02-19 – 2022-02-23 (×5): 1000 mL
  Filled 2022-02-19 (×14): qty 1000

## 2022-02-19 MED ORDER — LORAZEPAM 2 MG/ML IJ SOLN
1.0000 mg | Freq: Once | INTRAMUSCULAR | Status: AC | PRN
  Administered 2022-02-21: 1 mg via INTRAVENOUS
  Filled 2022-02-19: qty 1

## 2022-02-19 NOTE — Progress Notes (Signed)
Patient ID: Paula Lucas, female   DOB: Sep 08, 1990, 31 y.o.   MRN: [id number]   Assessment/Plan: Principal Problem:   Pelvic abscess  Active Problems:   Sepsis (HCC)   Left lower quadrant abdominal pain   MRSA (methicillin resistant staph aureus) culture positive   Hepatic abscess   Hx of Fitz-Hugh-Curtis syndrome  Pelvic abscess/ Hepatic abscess Patient has multiple abscesses in her abdomen and pelvis related to prior PID with current GC and Chlamydia negative. Patient now s/p ex lap with pelvic and abdominal wash out, POD #3. Has drains in place that are draining serosanguineous material only. Cultures from surgery are sterile for now. Has had IR drains placed x2 and has been on IV antibiotics for many days without significant improvement in her level of pain.Her WBC is coming down and is in the normal range for the first time since she has been here.  And she is afebrile.  Reports slight improvement in her abdominal pain.   +ve Flatus   Continue Vancomycin, Rocephin, Doxycycline, Flagyl--appreciate ID input   Pain medications as needed   W-->D dressing changes ordered--declined wound vac-and her wound was fresh we will have wound reassessed for potential VAC placement early next week.   Increase ambulation  Advance diet because without nutrition she will likely have poor wound healing as well as recovery.  Encouraged her to eat today.   If further surgery is planned, would need GYN ONC consult.   Sepsis (HCC) is not currently actively septic .   MRSA (methicillin resistant staph aureus) culture positive On vancomycin   Hypokalemia Replete and trend  Iron deficiency anemia No active bleeding Could consider IV iron (Venofer) prior to discharge.  Subjective: Interval History: She reports feeling like she might be able to tolerate some food this morning.  She is also supposed to get out of bed with PT today.  Objective: Vital signs in last 24 hours: Temp:  [98.6 F  (37 C)-99.5 F (37.5 C)] 99.5 F (37.5 C) (06/30 0900) Pulse Rate:  [105] 105 (06/30 0900) Resp:  [18-20] 20 (06/30 0900) BP: (117-143)/(79-98) 129/93 (06/30 0900) SpO2:  [90 %-100 %] 90 % (06/30 0900)  Intake/Output from previous day: 06/29 0701 - 06/30 0700 In: 1209.4  Out: 1340 [Urine:1300; Drains:40] Intake/Output this shift: No intake/output data recorded.  General appearance: alert, cooperative, and appears stated age Head: Normocephalic, without obvious abnormality, atraumatic Lungs: Normal effort Heart: mildly tachy rate, regular rhythm Abdomen: less distended, diffusely tender  Incision: Dressing is dry and intact. Extremities: Homans sign is negative, no sign of DVT Skin: Skin color, texture, turgor normal. No rashes or lesions Neurologic: Grossly normal  Results for orders placed or performed during the hospital encounter of 02/08/22 (from the past 24 hour(s))  Magnesium     Status: None   Collection Time: 02/19/22  4:40 AM  Result Value Ref Range   Magnesium 1.7 1.7 - 2.4 mg/dL  Phosphorus     Status: None   Collection Time: 02/19/22  4:40 AM  Result Value Ref Range   Phosphorus 4.4 2.5 - 4.6 mg/dL  CBC     Status: Abnormal   Collection Time: 02/19/22  4:40 AM  Result Value Ref Range   WBC 9.7 4.0 - 10.5 K/uL   RBC 2.84 (L) 3.87 - 5.11 MIL/uL   Hemoglobin 8.2 (L) 12.0 - 15.0 g/dL   HCT 48.1 (L) 85.6 - 31.4 %   MCV 89.1 80.0 - 100.0 fL   MCH 28.9 26.0 -  34.0 pg   MCHC 32.4 30.0 - 36.0 g/dL   RDW 96.0 (H) 45.4 - 09.8 %   Platelets 611 (H) 150 - 400 K/uL   nRBC 0.0 0.0 - 0.2 %  Basic metabolic panel     Status: Abnormal   Collection Time: 02/19/22  4:40 AM  Result Value Ref Range   Sodium 140 135 - 145 mmol/L   Potassium 3.8 3.5 - 5.1 mmol/L   Chloride 100 98 - 111 mmol/L   CO2 29 22 - 32 mmol/L   Glucose, Bld 90 70 - 99 mg/dL   BUN 6 6 - 20 mg/dL   Creatinine, Ser 1.19 0.44 - 1.00 mg/dL   Calcium 8.5 (L) 8.9 - 10.3 mg/dL   GFR, Estimated >14 >78  mL/min   Anion gap 11 5 - 15    Studies/Results: DG CHEST PORT 1 VIEW  Result Date: 02/18/2022 CLINICAL DATA:  Central line placement EXAM: PORTABLE CHEST 1 VIEW COMPARISON:  Radiograph 09/06/2021 FINDINGS: Right upper extremity PICC tip crosses the midline, across the brachiocephalic confluence, tip overlies the left brachiocephalic vein. Borderline enlarged cardiac silhouette. Bibasilar consolidations, left greater than right. Small bilateral pleural effusions. Low lung volumes. No pneumothorax. No acute osseous abnormality. IMPRESSION: Right upper extremity PICC tip courses across the brachiocephalic confluence, tip overlying the left brachiocephalic vein. Recommend repositioning. Bilateral pleural effusions with adjacent bibasilar atelectasis, left greater than right. Low lung volumes. These results will be called to the ordering clinician or representative by the Radiologist Assistant, and communication documented in the PACS or [employer]. Electronically Signed   By: Caprice Renshaw M.D.   On: 02/18/2022 18:18   [place] EKG SITE RITE  Result Date: 02/18/2022 If Site Rite image not attached, placement could not be confirmed due to current cardiac rhythm.  DG Abd Portable 1V  Result Date: 02/18/2022 CLINICAL DATA:  Abdominal distension. EXAM: PORTABLE ABDOMEN - 1 VIEW COMPARISON:  Abdominal CT February 14, 2022 FINDINGS: Drainage catheters again seen in the lower abdomen/pelvis. No significant change in the appearance of the bowel pattern. No evidence of small-bowel obstruction. IMPRESSION: Stable appearance of the bowel pattern. Electronically Signed   By: Ted Mcalpine M.D.   On: 02/18/2022 12:52   CT ABDOMEN PELVIS W CONTRAST  Result Date: 02/14/2022 CLINICAL DATA:  Follow-up intra-abdominal abscesses. EXAM: CT ABDOMEN AND PELVIS WITH CONTRAST TECHNIQUE: Multidetector CT imaging of the abdomen and pelvis was performed using the standard protocol following bolus administration of intravenous  contrast. RADIATION DOSE REDUCTION: This exam was performed according to the departmental dose-optimization program which includes automated exposure control, adjustment of the mA and/or kV according to patient size and/or use of iterative reconstruction technique. CONTRAST:  OMNIPAQUE IOHEXOL 300 MG/ML  SOLN COMPARISON:  CT scan 02/08/2022 FINDINGS: Lower chest: Persistent moderate-sized pleural effusions with overlying atelectasis. Hepatobiliary: Small residual lentiform rim enhancing abscess at the dome of the liver anteriorly measuring a maximum of 5.6 x 1.1 cm. This previously measured approximately 9.3 x 1.1 cm. Inferiorly there is a persistent perihepatic abscess. It measures approximately 9.5 x 3.1 cm and previously measured 10.1 x 3.7 cm. No worrisome hepatic lesions or intrahepatic biliary dilatation. The gallbladder is unremarkable. No common bile duct dilatation. Pancreas: No mass, inflammation or ductal dilatation. Spleen: Normal size.  No focal lesions. Adrenals/Urinary Tract: Adrenal glands and kidneys are unremarkable and stable. The bladder is grossly normal. Stomach/Bowel: The stomach, duodenum, small bowel and colon grossly normal. Vascular/Lymphatic: The aorta and branch vessels are patent.  The major venous structures are patent. Stable borderline enlarged mesenteric and retroperitoneal lymph nodes, likely reactive/inflammatory. Reproductive: The uterus and ovaries are stable. Other: Percutaneous drainage catheter in the upper right pelvis with a persistent rim enhancing abscess measuring approximately 8.8 x 3.5 cm and previously measuring 9.8 x 5.6 cm. A second drainage catheter in the lower right cul-de-sac area with a small persistent rim enhancing abscess measuring 4.1 x 2.0 cm. This previously measured 5.2 x 4.9 cm. Left-sided rounded pelvic abscess measures 3.6 x 2.9 cm on image 71/3. This previously measured 3.5 x 2.7 cm. Persistent diffuse mesenteric and subcutaneous  inflammations/edema. Musculoskeletal: No significant bony findings. IMPRESSION: 1. Persistent moderate-sized pleural effusions with overlying atelectasis. 2. Persistent but slightly smaller undrained perihepatic abscesses. 3. Two right-sided pelvic drainage catheters with persistent but smaller surrounding abscesses. 4. Left-sided undrained pelvic abscess is slightly smaller also. 5. Persistent diffuse mesenteric and subcutaneous inflammation. 6. Stable borderline enlarged mesenteric and retroperitoneal lymph nodes, likely reactive/inflammatory. Electronically Signed   By: Rudie Meyer M.D.   On: 02/14/2022 14:46   MR Lumbar Spine W Wo Contrast  Result Date: 02/13/2022 CLINICAL DATA:  Initial evaluation for back pain, history of MRSA bacteremia. EXAM: MRI LUMBAR SPINE WITHOUT AND WITH CONTRAST TECHNIQUE: Multiplanar and multiecho pulse sequences of the lumbar spine were obtained without and with intravenous contrast. CONTRAST:  7.65mL GADAVIST GADOBUTROL 1 MMOL/ML IV SOLN COMPARISON:  Prior CT from 02/08/2022. FINDINGS: Segmentation: Standard. Lowest well-formed disc space labeled the L5-S1 level. Alignment: Mild levoscoliosis with straightening of the normal lumbar lordosis. No listhesis. Vertebrae: Vertebral body height maintained without acute or chronic fracture. Bone marrow signal intensity diffusely decreased on T1 weighted sequence, nonspecific, but most commonly related to anemia, smoking or obesity. No discrete or worrisome osseous lesions. No evidence for osteomyelitis discitis or septic arthritis within the lumbar spine. Conus medullaris and cauda equina: Conus extends to the L1-2 level. Conus and cauda equina appear normal. Paraspinal and other soft tissues: Paraspinous soft tissues demonstrate no acute finding. Multi loculated collections/abscesses with surrounding enhancement partially seen within the partially visualized pelvis, better characterized on recent CT. Remainder of the visualized  visceral structures otherwise unremarkable. Disc levels: L1-2:  Unremarkable. L2-3: Small left subarticular disc protrusion mildly indents the left ventral thecal sac (series 12, image 16). Borderline mild narrowing of the left lateral recess. Central canal remains widely patent. No foraminal stenosis. L3-4:  Unremarkable. L4-5:  Unremarkable. L5-S1: Left subarticular disc protrusion extends into the left lateral recess, contacting the descending left S1 nerve root (series 12, image 34). Resultant mild left lateral recess stenosis. Central canal remains patent. No significant foraminal stenosis. IMPRESSION: 1. No MRI evidence for acute infection within the lumbar spine. 2. Left subarticular disc protrusion at L5-S1, contacting the descending left S1 nerve root in the left lateral recess. 3. Small left subarticular disc protrusion at L2-3 with borderline mild left lateral recess stenosis. 4. Multiloculated collections/abscesses within the partially visualized pelvis, better characterized on recent CT. Electronically Signed   By: Rise Mu M.D.   On: 02/13/2022 04:04   ECHOCARDIOGRAM COMPLETE  Result Date: 02/12/2022    ECHOCARDIOGRAM REPORT   Patient Name:   Paula Lucas Date of Exam: 02/12/2022 Medical Rec #:  [id number]        Height:       63.0 in Accession #:    [id number]       Weight:       169.5 lb Date of Birth:  16-Mar-1991  BSA:          1.802 m Patient Age:    31 years         BP:           113/87 mmHg Patient Gender: F                HR:           98 bpm. Exam Location:  Inpatient Procedure: 2D Echo, Cardiac Doppler and Color Doppler Indications:   Staph aureus infection  History:       Patient has no prior history of Echocardiogram examinations.                Sepsis.  Sonographer:   Ross Ludwig RDCS (AE) Referring      TRUNG T VU Phys: IMPRESSIONS  1. Left ventricular ejection fraction, by estimation, is 60 to 65%. The left ventricle has normal function. The left ventricle has no  regional wall motion abnormalities. Left ventricular diastolic parameters were normal.  2. Right ventricular systolic function is normal. The right ventricular size is normal.  3. The mitral valve is normal in structure. Trivial mitral valve regurgitation. No evidence of mitral stenosis.  4. The aortic valve is normal in structure. Aortic valve regurgitation is not visualized. No aortic stenosis is present. FINDINGS  Left Ventricle: Left ventricular ejection fraction, by estimation, is 60 to 65%. The left ventricle has normal function. The left ventricle has no regional wall motion abnormalities. The left ventricular internal cavity size was normal in size. There is  no left ventricular hypertrophy. Left ventricular diastolic parameters were normal. Right Ventricle: The right ventricular size is normal. Right vetricular wall thickness was not well visualized. Right ventricular systolic function is normal. Left Atrium: Left atrial size was normal in size. Right Atrium: Right atrial size was normal in size. Pericardium: There is no evidence of pericardial effusion. Mitral Valve: The mitral valve is normal in structure. Trivial mitral valve regurgitation. No evidence of mitral valve stenosis. Tricuspid Valve: The tricuspid valve is normal in structure. Tricuspid valve regurgitation is trivial. Aortic Valve: The aortic valve is normal in structure. Aortic valve regurgitation is not visualized. No aortic stenosis is present. Aortic valve mean gradient measures 4.0 mmHg. Aortic valve peak gradient measures 7.1 mmHg. Aortic valve area, by VTI measures 2.25 cm. Pulmonic Valve: The pulmonic valve was normal in structure. Pulmonic valve regurgitation is not visualized. Aorta: The aortic root and ascending aorta are structurally normal, with no evidence of dilitation. IAS/Shunts: The atrial septum is grossly normal.  LEFT VENTRICLE PLAX 2D LVIDd:         4.80 cm LVIDs:         3.40 cm LV PW:         0.90 cm LV IVS:         1.10 cm LVOT diam:     1.90 cm LV SV:         49 LV SV Index:   27 LVOT Area:     2.84 cm  RIGHT VENTRICLE             IVC RV Basal diam:  2.30 cm     IVC diam: 1.50 cm RV S prime:     12.40 cm/s TAPSE (M-mode): 2.1 cm LEFT ATRIUM             Index        RIGHT ATRIUM          Index LA  diam:        3.80 cm 2.11 cm/m   RA Area:     9.13 cm LA Vol (A2C):   26.4 ml 14.65 ml/m  RA Volume:   15.80 ml 8.77 ml/m LA Vol (A4C):   29.7 ml 16.48 ml/m LA Biplane Vol: 29.3 ml 16.26 ml/m  AORTIC VALVE AV Area (Vmax):    2.24 cm AV Area (Vmean):   2.21 cm AV Area (VTI):     2.25 cm AV Vmax:           133.00 cm/s AV Vmean:          87.900 cm/s AV VTI:            0.218 m AV Peak Grad:      7.1 mmHg AV Mean Grad:      4.0 mmHg LVOT Vmax:         105.00 cm/s LVOT Vmean:        68.600 cm/s LVOT VTI:          0.173 m LVOT/AV VTI ratio: 0.79  AORTA Ao Root diam: 2.90 cm Ao Asc diam:  2.70 cm  SHUNTS Systemic VTI:  0.17 m Systemic Diam: 1.90 cm Kristeen Miss MD Electronically signed by Kristeen Miss MD Signature Date/Time: 02/12/2022/4:39:15 PM    Final    VAS [place] LOWER EXTREMITY VENOUS (DVT)  Result Date: 02/12/2022  Lower Venous DVT Study Patient Name:  Paula Lucas  Date of Exam:   02/12/2022 Medical Rec #: [id number]         Accession #:    [id number] Date of Birth: 01-27-1991         Patient Gender: F Patient Age:   56 years Exam Location:  [facility] Procedure:      VAS [place] LOWER EXTREMITY VENOUS (DVT) Referring Phys: JULIE MACHEN --------------------------------------------------------------------------------  Indications: Pain.  Risk Factors: None identified. Limitations: Poor patient cooperation, patient pain tolerance. Comparison Study: No prior studies. Performing Technologist: Chanda Busing RVT  Examination Guidelines: A complete evaluation includes B-mode imaging, spectral Doppler, color Doppler, and power Doppler as needed of all accessible portions of each vessel. Bilateral testing is considered an  integral part of a complete examination. Limited examinations for reoccurring indications may be performed as noted. The reflux portion of the exam is performed with the patient in reverse Trendelenburg.  +---------+---------------+---------+-----------+----------+--------------+ RIGHT    CompressibilityPhasicitySpontaneityPropertiesThrombus Aging +---------+---------------+---------+-----------+----------+--------------+ CFV      Full           Yes      Yes                                 +---------+---------------+---------+-----------+----------+--------------+ SFJ      Full                                                        +---------+---------------+---------+-----------+----------+--------------+ FV Prox  Full                                                        +---------+---------------+---------+-----------+----------+--------------+ FV Mid   Full                                                        +---------+---------------+---------+-----------+----------+--------------+  FV DistalFull                                                        +---------+---------------+---------+-----------+----------+--------------+ PFV      Full                                                        +---------+---------------+---------+-----------+----------+--------------+ POP      Full           Yes      Yes                                 +---------+---------------+---------+-----------+----------+--------------+ PTV      Full                                                        +---------+---------------+---------+-----------+----------+--------------+ PERO     Full                                                        +---------+---------------+---------+-----------+----------+--------------+   +----+---------------+---------+-----------+----------+--------------+ LEFTCompressibilityPhasicitySpontaneityPropertiesThrombus Aging  +----+---------------+---------+-----------+----------+--------------+ CFV Full           Yes      Yes                                 +----+---------------+---------+-----------+----------+--------------+    Summary: RIGHT: - There is no evidence of deep vein thrombosis in the lower extremity.  - No cystic structure found in the popliteal fossa.  LEFT: - No evidence of common femoral vein obstruction.  *See table(s) above for measurements and observations. Electronically signed by Sherald Hess MD on 02/12/2022 at 11:23:51 AM.    Final    CT IMAGE GUIDED DRAINAGE BY PERCUTANEOUS CATHETER  Result Date: 02/10/2022 INDICATION: History of tubo-ovarian abscess, post CT-guided placement right lower abdominal/pelvic drainage catheter on 02/05/2022. Postprocedural CT scan performed 02/08/2022 demonstrates an unchanged fluid collection within the right lower pelvis and as such request made for placement of an additional percutaneous drainage catheter for infection source control purposes. Additionally, there is request for CT-guided exchange and/or repositioning of the pre-existing drainage catheter into the more dominant component of the residual pelvic abscess EXAM: 1. CT GUIDED RIGHT TRANS GLUTEAL APPROACH TUBO-OVARIAN ABSCESS DRAINAGE CATHETER PLACEMENT 2. CT-GUIDED EXCHANGE AND REPOSITIONING OF EXISTING RIGHT LOWER ABDOMINAL/PELVIC DRAINAGE CATHETER COMPARISON:  CT abdomen pelvis-02/08/2022; 02/04/2022 CT guided right lower abdominal/pelvic abscess drainage catheter placement-02/05/2022 MEDICATIONS: The patient is currently admitted to the hospital and receiving intravenous antibiotics. The antibiotics were administered within an appropriate time frame prior to the initiation of the procedure. ANESTHESIA/SEDATION: Moderate (conscious) sedation was employed during this procedure as administered by the Interventional Radiology RN. A total of Versed 4 mg,  Dilaudid 1 mg and Fentanyl 150 mcg was administered  intravenously. Moderate Sedation Time: 60 minutes. The patient's level of consciousness and vital signs were monitored continuously by radiology nursing throughout the procedure under my direct supervision. CONTRAST:  None COMPLICATIONS: None immediate. PROCEDURE: RADIATION DOSE REDUCTION: This exam was performed according to the departmental dose-optimization program which includes automated exposure control, adjustment of the mA and/or kV according to patient size and/or use of iterative reconstruction technique. Informed written consent was obtained from the patient after a discussion of the risks, benefits and alternatives to treatment. The patient was initially placed prone on the CT gantry and a pre procedural CT was performed re-demonstrating the known abscess/fluid collection within the right hemipelvis with dominant component measuring approximately 5.5 x 5.4 cm (image 11, series 2). The procedure was planned. A timeout was performed prior to the initiation of the procedure. The skin overlying the right buttocks was prepped and draped in the usual sterile fashion. The overlying soft tissues were anesthetized with 1% lidocaine with epinephrine. Appropriate trajectory was planned with the use of a 22 gauge spinal needle. An 18 gauge trocar needle was advanced into the abscess/fluid collection and a short Amplatz super stiff wire was coiled within the collection. Appropriate positioning was confirmed with a limited CT scan. The tract was serially dilated allowing placement of a 10 [place] all-purpose drainage catheter. Appropriate positioning was confirmed with a limited postprocedural CT scan. Approximately 65 ml of purulent appearing serous fluid was aspirated. The tube was connected to a drainage bag and secured in place within interrupted suture and a Stat Lock device. A dressing was applied. The patient was then repositioned supine on the CT gantry and noncontrast images were obtained of the lower pelvis  and pre-existing right anterior approach lower abdominal/pelvic drainage catheter. Attempts were made to retract the existing drainage catheter into the dominant component of the residual collection however this proved uncomfortable for the patient. As such, the external portion of the drainage catheter was prepped and draped in the usual sterile fashion. The external portion of the drainage catheter was cut and cannulated with a short Amplatz wire. Existing 12 French drainage catheter was then exchanged for a new 12 French percutaneous catheter which under intermittent CT guidance was repositioned into the dominant residual component of the right lower quadrant collection (series 11). The drainage catheter was connected to a gravity bag and secured in place within interrupted suture and a Stat Lock device. Patient tolerated the above procedures well (though did require a generous amount of conscious sedation medications) without immediate postprocedural complication. IMPRESSION: 1. Successful CT guided placement of a 10 [place] all purpose drain catheter into the undrained pelvic abscess via right trans gluteal approach with aspiration of 65 mL of purulent fluid. Samples were sent to the laboratory as requested by the ordering clinical team. 2. Successful CT guided exchange and repositioning of pre-existing 12 French right anterior abdominal/pelvic drainage catheter with end now coiled and locked within the residual anteriorly located abscess cavity. Electronically Signed   By: Simonne Come M.D.   On: 02/10/2022 16:33   CT ABSCESS CATH EXCHANGE  Result Date: 02/10/2022 INDICATION: History of tubo-ovarian abscess, post CT-guided placement right lower abdominal/pelvic drainage catheter on 02/05/2022. Postprocedural CT scan performed 02/08/2022 demonstrates an unchanged fluid collection within the right lower pelvis and as such request made for placement of an additional percutaneous drainage catheter for infection  source control purposes. Additionally, there is request for CT-guided exchange and/or repositioning of  the pre-existing drainage catheter into the more dominant component of the residual pelvic abscess EXAM: 1. CT GUIDED RIGHT TRANS GLUTEAL APPROACH TUBO-OVARIAN ABSCESS DRAINAGE CATHETER PLACEMENT 2. CT-GUIDED EXCHANGE AND REPOSITIONING OF EXISTING RIGHT LOWER ABDOMINAL/PELVIC DRAINAGE CATHETER COMPARISON:  CT abdomen pelvis-02/08/2022; 02/04/2022 CT guided right lower abdominal/pelvic abscess drainage catheter placement-02/05/2022 MEDICATIONS: The patient is currently admitted to the hospital and receiving intravenous antibiotics. The antibiotics were administered within an appropriate time frame prior to the initiation of the procedure. ANESTHESIA/SEDATION: Moderate (conscious) sedation was employed during this procedure as administered by the Interventional Radiology RN. A total of Versed 4 mg, Dilaudid 1 mg and Fentanyl 150 mcg was administered intravenously. Moderate Sedation Time: 60 minutes. The patient's level of consciousness and vital signs were monitored continuously by radiology nursing throughout the procedure under my direct supervision. CONTRAST:  None COMPLICATIONS: None immediate. PROCEDURE: RADIATION DOSE REDUCTION: This exam was performed according to the departmental dose-optimization program which includes automated exposure control, adjustment of the mA and/or kV according to patient size and/or use of iterative reconstruction technique. Informed written consent was obtained from the patient after a discussion of the risks, benefits and alternatives to treatment. The patient was initially placed prone on the CT gantry and a pre procedural CT was performed re-demonstrating the known abscess/fluid collection within the right hemipelvis with dominant component measuring approximately 5.5 x 5.4 cm (image 11, series 2). The procedure was planned. A timeout was performed prior to the initiation of  the procedure. The skin overlying the right buttocks was prepped and draped in the usual sterile fashion. The overlying soft tissues were anesthetized with 1% lidocaine with epinephrine. Appropriate trajectory was planned with the use of a 22 gauge spinal needle. An 18 gauge trocar needle was advanced into the abscess/fluid collection and a short Amplatz super stiff wire was coiled within the collection. Appropriate positioning was confirmed with a limited CT scan. The tract was serially dilated allowing placement of a 10 [place] all-purpose drainage catheter. Appropriate positioning was confirmed with a limited postprocedural CT scan. Approximately 65 ml of purulent appearing serous fluid was aspirated. The tube was connected to a drainage bag and secured in place within interrupted suture and a Stat Lock device. A dressing was applied. The patient was then repositioned supine on the CT gantry and noncontrast images were obtained of the lower pelvis and pre-existing right anterior approach lower abdominal/pelvic drainage catheter. Attempts were made to retract the existing drainage catheter into the dominant component of the residual collection however this proved uncomfortable for the patient. As such, the external portion of the drainage catheter was prepped and draped in the usual sterile fashion. The external portion of the drainage catheter was cut and cannulated with a short Amplatz wire. Existing 12 French drainage catheter was then exchanged for a new 12 French percutaneous catheter which under intermittent CT guidance was repositioned into the dominant residual component of the right lower quadrant collection (series 11). The drainage catheter was connected to a gravity bag and secured in place within interrupted suture and a Stat Lock device. Patient tolerated the above procedures well (though did require a generous amount of conscious sedation medications) without immediate postprocedural complication.  IMPRESSION: 1. Successful CT guided placement of a 10 [place] all purpose drain catheter into the undrained pelvic abscess via right trans gluteal approach with aspiration of 65 mL of purulent fluid. Samples were sent to the laboratory as requested by the ordering clinical team. 2. Successful CT guided exchange and repositioning  of pre-existing 12 French right anterior abdominal/pelvic drainage catheter with end now coiled and locked within the residual anteriorly located abscess cavity. Electronically Signed   By: Simonne Come M.D.   On: 02/10/2022 16:33   CT ABDOMEN PELVIS W CONTRAST  Result Date: 02/08/2022 CLINICAL DATA:  Sepsis EXAM: CT ABDOMEN AND PELVIS WITH CONTRAST TECHNIQUE: Multidetector CT imaging of the abdomen and pelvis was performed using the standard protocol following bolus administration of intravenous contrast. RADIATION DOSE REDUCTION: This exam was performed according to the departmental dose-optimization program which includes automated exposure control, adjustment of the mA and/or kV according to patient size and/or use of iterative reconstruction technique. CONTRAST:  OMNIPAQUE IOHEXOL 300 MG/ML  SOLN COMPARISON:  Previous studies including the CT done on 02/11/2022 FINDINGS: Lower chest: Small bilateral pleural effusions are seen more so on the right side. There are linear patchy infiltrates in the lingula and both lower lobes suggesting atelectasis/pneumonia. Hepatobiliary: No focal abnormality is seen in the liver. There is no dilation of bile ducts. Gallbladder is unremarkable. Small ascites is noted in the perihepatic region. Pancreas: No focal abnormality is seen. Spleen: Unremarkable. Adrenals/Urinary Tract: Adrenals are not enlarged. There is no hydronephrosis. There are no renal or ureteral stones. Urinary bladder is unremarkable. There is 3 mm low-density in the midportion of left kidney which is too small to be characterized. Stomach/Bowel: Stomach is not distended. Small  bowel loops are not dilated. Appendix is not seen. There is no significant wall thickening in the colon. There is interval placement of percutaneous drainage catheter with its tip in the right lower abdomen. There is interval decrease in size of the complex fluid collection in the right lower quadrant. There is 4 cm loculated fluid collection slightly to the left of midline at the level of pelvic inlet. There is 7.2 x 5.3 x 4.9 cm loculated fluid collection with minimally enhancing margin in the cul-de-sac in the pelvis. There are no pockets of air within these loculated fluid collections. There is stranding in the fat planes in the lower abdomen and pelvis. Vascular/Lymphatic: Vascular structures are unremarkable. Reproductive: Uterus is to the right of midline. Other: There is no pneumoperitoneum. Musculoskeletal: No acute findings are seen. IMPRESSION: Interval placement of percutaneous drainage catheter within air-fluid collection in the right lower abdomen with interval decrease in size. There is 7.2 cm loculated fluid collection in the cul-de-sac in the pelvis with slightly enhancing margins. There is another 4 cm loculated fluid collection in the left side of the pelvis. These may suggest loculated serous fluid collections or abscesses. Small ascites is noted in the perihepatic region. There is no evidence of intestinal obstruction or pneumoperitoneum. There is no hydronephrosis. Small bilateral pleural effusions. There are linear patchy infiltrates in both lower lung fields suggesting atelectasis/pneumonia. Electronically Signed   By: Ernie Avena M.D.   On: 02/08/2022 09:41   CT IMAGE GUIDED DRAINAGE BY PERCUTANEOUS CATHETER  Result Date: 02/05/2022 INDICATION: PID with large abdominopelvic fluid collection. EXAM: CT GUIDED DRAINAGE CATHETER PLACEMENT INTO ABDOMINOPELVIC FLUID COLLECTION RADIATION DOSE REDUCTION: This exam was performed according to the departmental dose-optimization program  which includes automated exposure control, adjustment of the mA and/or kV according to patient size and/or use of iterative reconstruction technique. COMPARISON:  CT AP, 2022-02-11 MEDICATIONS: The patient is currently admitted to the hospital and receiving intravenous antibiotics. The antibiotics were administered within an appropriate time frame prior to the initiation of the procedure. ANESTHESIA/SEDATION: Moderate (conscious) sedation was employed during this procedure.  A total of Versed 3 mg and Fentanyl 75 mcg was administered intravenously. Additionally, 1 mg Dilaudid was administered IV. Moderate Sedation Time: 19 minutes. The patient's level of consciousness and vital signs were monitored continuously by radiology nursing throughout the procedure under my direct supervision. CONTRAST:  None COMPLICATIONS: None immediate. PROCEDURE: Informed written consent was obtained from the patient and/or patient's representative after a discussion of the risks, benefits and alternatives to treatment. The patient was placed supine on the CT gantry and a pre procedural CT was performed re-demonstrating the known abscess/fluid collection within the lower abdomen/pelvis. The procedure was planned. A timeout was performed prior to the initiation of the procedure. The RIGHT lower quadrant was prepped and draped in the usual sterile fashion. The overlying soft tissues were anesthetized with 1% lidocaine with epinephrine. Appropriate trajectory was planned with the use of a 22 gauge spinal needle. An 18 gauge trocar needle was advanced into the abscess/fluid collection and a short Amplatz super stiff wire was coiled within the collection. Appropriate positioning was confirmed with a limited CT scan. The tract was serially dilated allowing placement of a 12 [place] all-purpose drainage catheter. Appropriate positioning was confirmed with a limited postprocedural CT scan. 5 mL ml of serous fluid was aspirated and submitted for  analysis. The tube was connected to a drainage bag and sutured in place. A dressing was placed. The patient tolerated the procedure well without immediate post procedural complication. IMPRESSION: Successful CT guided placement of a 12 Fr drainage catheter into the abdominopelvic collection with aspiration of serous fluid, as above. Samples were sent to the laboratory as requested by the ordering clinical team. Roanna Banning, MD [facility] Radiology Electronically Signed   By: Roanna Banning M.D.   On: 02/05/2022 18:08   CT ABDOMEN PELVIS WO CONTRAST  Result Date: 02/04/2022 CLINICAL DATA:  Abdominal pain, acute, nonlocalized septic shock, abnormal uterine US, concern for PID EXAM: CT ABDOMEN AND PELVIS WITHOUT CONTRAST TECHNIQUE: Multidetector CT imaging of the abdomen and pelvis was performed following the standard protocol without IV contrast. RADIATION DOSE REDUCTION: This exam was performed according to the departmental dose-optimization program which includes automated exposure control, adjustment of the mA and/or kV according to patient size and/or use of iterative reconstruction technique. COMPARISON:  Pelvic ultrasound today FINDINGS: Lower chest: Pleural thickening noted peripherally in the right lower hemithorax, possibly representing trace loculated pleural effusion. No confluent airspace opacities. Hepatobiliary: No focal hepatic abnormality. Gallbladder unremarkable. Pancreas: No focal abnormality or ductal dilatation. Spleen: No focal abnormality.  Normal size. Adrenals/Urinary Tract: No adrenal abnormality. No focal renal abnormality. No stones or hydronephrosis. Urinary bladder is unremarkable. Stomach/Bowel: Wall thickening noted in the cecum which is immediately adjacent to the large pelvic fluid collection described below. This could reflect colitis or be secondarily inflamed. Stomach and small bowel grossly unremarkable. Vascular/Lymphatic: No  evidence of aneurysm or adenopathy. Reproductive: Large complex cystic area or fluid collection seen in the pelvis extending from the cul-de-sac superiorly into the right lower quadrant adjacent to the cecum. This measures approximately 12 x 8 cm on image 64 of series 3. Internal areas of increased density. This could reflect blood or abscess/PID. Uterus grossly unremarkable on this noncontrast study. Ovaries not definitively visualized. Other: There is free fluid adjacent to the liver and spleen. Musculoskeletal: No acute bony abnormality. IMPRESSION: Complex process in the pelvis including what appears to be a large complex fluid collection measuring 12 x 8 cm extending from the cul-de-sac  superiorly to the cecum which could reflect blood products or abscess. This also could conceivably be related to and ovary. Neither ovary definitively identified. Wall thickening in the cecum could reflect colitis or secondary inflammation related to the pelvic process. Perihepatic and perisplenic ascites. Possible trace loculated right pleural effusion peripherally in the right lower hemithorax. Electronically Signed   By: Charlett NoseKevin  Dover M.D.   On: 02/04/2022 22:29   US PELVIC COMPLETE WITH TRANSVAGINAL  Addendum Date: 02/04/2022   ADDENDUM REPORT: 02/04/2022 13:44 ADDENDUM: In light of negative quantitative beta HCG and no documented pregnancy the possibility of infectious causes for the above findings may be more likely but findings are essentially quite nonspecific at this time. Even noncontrast CT could prove beneficial to allow for assessment of pelvic fluid to determine whether this represents frank hemoperitoneum and to assess for any associated inflammatory changes that may be present not well assessed on ultrasound. These results were called by telephone at the time of interpretation on 02/04/2022 at 1:44 pm to provider [facility] , who verbally acknowledged these results. Electronically Signed   By: Donzetta KohutGeoffrey   Wile M.D.   On: 02/04/2022 13:44   Result Date: 02/04/2022 CLINICAL DATA:  A 31 year old female presents with pelvic pain and bleeding. Patient reports positive pregnancy but urine pregnancy currently negative by report and beta HCG is currently pending. EXAM: TRANSABDOMINAL AND TRANSVAGINAL ULTRASOUND OF PELVIS TECHNIQUE: Both transabdominal and transvaginal ultrasound examinations of the pelvis were performed. Transabdominal technique was performed for global imaging of the pelvis including uterus, ovaries, adnexal regions, and pelvic cul-de-sac. It was necessary to proceed with endovaginal exam following the transabdominal exam to visualize the endometrium and adnexa. COMPARISON:  No recent imaging is available for comparison. FINDINGS: Uterus Measurements: 12.6 x 8.0 x 8.5 (volume = 450 cc) cm. = volume: 450 mL. Heterogeneous uterus with thickened endometrium. Cystic area in the endometrial canal that measures 4.5 x 2.6 x 4.4 cm. This cystic characteristics and central increased echogenicity. Central increased echogenicity the without discrete finding that would suggest embryo with very limited assessment due to patient discomfort and clinical condition. Endometrium Thickness: 3.3 cm. Thickened with cystic area in the fundus of the uterus. Right ovary Measurements: 3.2 x 2.7 x 2.1 (volume = 9.5 cc) cm = volume: 9.5 mL. Normal appearance/no adnexal mass. Left ovary Not visible, in the area of the LEFT adnexa is heterogeneous material that has the appearance of blood products but conforms to the cul-de-sac and LEFT adnexa. Other findings Moderate heterogeneous fluid in the LEFT adnexa primarily but also tracking along the RIGHT uterus. IMPRESSION: Unusual cystic area in the endometrial canal. If this patient has any history of recent positive pregnancy test this could represent a failed gestation with retained products of conception. Pseudo gestational sac with areas of internal hemorrhage in the setting of  ruptured ectopic pregnancy is also considered. Heterogeneous appearance of material in the cul-de-sac could also potentially represent sequela of pyogenic infection though this is lower on the differential at this time based on appearance. If the patient could tolerate further evaluation with cross-sectional imaging CT could potentially be helpful. Ultimately gyn consultation is suggested. Critical Value/emergent results were called by telephone at the time of interpretation on 02/04/2022 at 11:01 am to [facility] , who verbally acknowledged these results. Electronically Signed: By: Donzetta KohutGeoffrey  Wile M.D. On: 02/04/2022 11:01   DG Chest Port 1 View  Result Date: 02/04/2022 CLINICAL DATA:  31 year old female with history of lower abdominal pain and  vaginal bleeding 3 days ago. Fever. Low blood pressure. EXAM: PORTABLE CHEST 1 VIEW COMPARISON:  Chest x-ray 08/09/2021. FINDINGS: Lung volumes are slightly low. No consolidative airspace disease. No pleural effusions. No pneumothorax. No evidence of pulmonary edema. Heart size is normal. The patient is rotated to the right on today's exam, resulting in distortion of the mediastinal contours and reduced diagnostic sensitivity and specificity for mediastinal pathology. IMPRESSION: 1. No radiographic evidence of acute cardiopulmonary disease. Electronically Signed   By: Trudie Reed M.D.   On: 02/04/2022 10:55    Scheduled Meds:  Chlorhexidine Gluconate Cloth  6 each Topical Daily   docusate sodium  100 mg Oral BID   doxycycline  100 mg Oral Q12H   DULoxetine  20 mg Oral Daily   enoxaparin (LOVENOX) injection  40 mg Subcutaneous Q24H   feeding supplement  237 mL Oral BID BM   gabapentin  300 mg Oral TID   ketorolac  15 mg Intravenous Q6H   lidocaine  1 patch Transdermal Daily   metroNIDAZOLE  500 mg Oral Q12H   multivitamin with minerals  1 tablet Oral Daily   nicotine  21 mg Transdermal Daily   oxyCODONE  20 mg Oral Q12H   polyethylene  glycol  17 g Oral Daily   sodium chloride flush  10-40 mL Intracatheter Q12H   sodium chloride flush  3 mL Intravenous Q12H   sodium chloride flush  5 mL Intracatheter Q8H   traZODone  50 mg Oral QHS   Continuous Infusions:  cefTRIAXone (ROCEPHIN)  IV 2 g (02/18/22 2139)   vancomycin 1,000 mg (02/19/22 0915)   PRN Meds:bisacodyl, diphenhydrAMINE **OR** diphenhydrAMINE, HYDROmorphone (DILAUDID) injection, hydrOXYzine, lip balm, LORazepam, menthol-cetylpyridinium, ondansetron **OR** ondansetron (ZOFRAN) IV, polyvinyl alcohol, prochlorperazine, simethicone, sodium chloride flush, sodium chloride flush    LOS: 10 days   Reva Bores, MD 02/19/2022 10:00 AM

## 2022-02-19 NOTE — Progress Notes (Signed)
Bladder scan ordered , however unable to obtain due to surgical site.Current anatomy does not permit obtaining bladder scan

## 2022-02-19 NOTE — Progress Notes (Signed)
PT Cancellation Note  Patient Details Name: Paula Lucas MRN: [id number] DOB: [birth year]   Cancelled Treatment:    Reason Eval/Treat Not Completed: (P) Patient declined, no reason specified (pt drowsy, attempted ~1min after premedication for pain with IV meds, pt wanting to sleep despite instruction on benefits of mobility/risks of immobility.) Will continue efforts, pt has now refused PT sessions 2x.    Nachum Derossett M [name] 02/19/2022, 11:22 AM

## 2022-02-19 NOTE — Progress Notes (Addendum)
Patient ID: Paula Lucas, female   DOB: [birth year], 31 y.o.   MRN: [id number] 3 Days Post-Op    Subjective: Patient feels her abd pain is slowly improving. She is having flatus. She denies BM since surgery. I encouraged her to try to get up to the bedside commode for urination and she c/o urinary urgency, stating she could not make it to the commode in time. I encouraged her to consider eating her meals in the chair, rather than in the bed, even if she is not eating much. She states she cannot eat because 1. Nausea. 2. No appetite.   Objective: Vital signs in last 24 hours: Temp:  [98.6 F (37 C)-99.5 F (37.5 C)] 99.5 F (37.5 C) (06/30 0900) Pulse Rate:  [105] 105 (06/30 0900) Resp:  [18-20] 20 (06/30 0900) BP: (117-143)/(79-98) 129/93 (06/30 0900) SpO2:  [90 %-100 %] 90 % (06/30 0900) Last BM Date : 02/16/22  Intake/Output from previous day: 06/29 0701 - 06/30 0700 In: 1209.4 [IV Piggyback:1209.4] Out: 1340 [Urine:1300; Drains:40] Intake/Output this shift: No intake/output data recorded.  GI: soft, actually less tender overall but very sore at incision ; midline wound examined - fascia in tact, wound appears clean - not much granulation but no purulence and no fibrinous exudate. JPx2 with SS drainge  Lab Results: CBC  Recent Labs    02/18/22 0154 02/19/22 0440  WBC 12.9* 9.7  HGB 8.4* 8.2*  HCT 25.6* 25.3*  PLT 649* 611*   BMET Recent Labs    02/18/22 0154 02/19/22 0440  NA 141 140  K 3.2* 3.8  CL 104 100  CO2 29 29  GLUCOSE 94 90  BUN 5* 6  CREATININE 0.80 0.79  CALCIUM 8.4* 8.5*   PT/INR No results for input(s): "LABPROT", "INR" in the last 72 hours. ABG No results for input(s): "PHART", "HCO3" in the last 72 hours.  Invalid input(s): "PCO2", "PO2"  Studies/Results: DG CHEST PORT 1 VIEW  Result Date: 02/18/2022 CLINICAL DATA:  Central line placement EXAM: PORTABLE CHEST 1 VIEW COMPARISON:  Radiograph 09/06/2021 FINDINGS: Right upper extremity PICC  tip crosses the midline, across the brachiocephalic confluence, tip overlies the left brachiocephalic vein. Borderline enlarged cardiac silhouette. Bibasilar consolidations, left greater than right. Small bilateral pleural effusions. Low lung volumes. No pneumothorax. No acute osseous abnormality. IMPRESSION: Right upper extremity PICC tip courses across the brachiocephalic confluence, tip overlying the left brachiocephalic vein. Recommend repositioning. Bilateral pleural effusions with adjacent bibasilar atelectasis, left greater than right. Low lung volumes. These results will be called to the ordering clinician or representative by the Radiologist Assistant, and communication documented in the PACS or [employer]. Electronically Signed   By: Caprice Renshaw M.D.   On: 02/18/2022 18:18   [place] EKG SITE RITE  Result Date: 02/18/2022 If Site Rite image not attached, placement could not be confirmed due to current cardiac rhythm.  DG Abd Portable 1V  Result Date: 02/18/2022 CLINICAL DATA:  Abdominal distension. EXAM: PORTABLE ABDOMEN - 1 VIEW COMPARISON:  Abdominal CT February 14, 2022 FINDINGS: Drainage catheters again seen in the lower abdomen/pelvis. No significant change in the appearance of the bowel pattern. No evidence of small-bowel obstruction. IMPRESSION: Stable appearance of the bowel pattern. Electronically Signed   By: Ted Mcalpine M.D.   On: 02/18/2022 12:52    Anti-infectives: Anti-infectives (From admission, onward)    Start     Dose/Rate Route Frequency Ordered Stop   02/17/22 1130  fluconazole (DIFLUCAN) tablet 200 mg  200 mg Oral Daily 02/17/22 1032 02/17/22 1145   02/13/22 2200  vancomycin (VANCOCIN) IVPB 1000 mg/200 mL premix        1,000 mg 200 mL/hr over 60 Minutes Intravenous Every 12 hours 02/13/22 0817     02/13/22 0900  vancomycin (VANCOREADY) IVPB 1750 mg/350 mL        1,750 mg 175 mL/hr over 120 Minutes Intravenous  Once 02/13/22 0809 02/13/22 1258   02/12/22  1315  doxycycline (VIBRA-TABS) tablet 100 mg        100 mg Oral Every 12 hours 02/12/22 1220     02/12/22 1000  doxycycline (VIBRA-TABS) tablet 100 mg  Status:  Discontinued        100 mg Oral Daily 02/12/22 0804 02/12/22 1219   02/11/22 1345  doxycycline (VIBRA-TABS) tablet 100 mg        100 mg Oral  Once 02/11/22 1255 02/11/22 1346   02/11/22 1345  metroNIDAZOLE (FLAGYL) tablet 500 mg        500 mg Oral Every 12 hours 02/11/22 1255     02/09/22 2051  metroNIDAZOLE (FLAGYL) IVPB 500 mg  Status:  Discontinued        500 mg 100 mL/hr over 60 Minutes Intravenous Every 12 hours 02/09/22 1106 02/11/22 1255   02/09/22 1600  cefTRIAXone (ROCEPHIN) 2 g in sodium chloride 0.9 % 100 mL IVPB        2 g 200 mL/hr over 30 Minutes Intravenous Every 24 hours 02/09/22 1147     02/08/22 1700  metroNIDAZOLE (FLAGYL) IVPB 500 mg  Status:  Discontinued        500 mg 100 mL/hr over 60 Minutes Intravenous Every 8 hours 02/08/22 1205 02/09/22 1106   02/08/22 1630  ceFEPIme (MAXIPIME) 2 g in sodium chloride 0.9 % 100 mL IVPB  Status:  Discontinued        2 g 200 mL/hr over 30 Minutes Intravenous Every 8 hours 02/08/22 0822 02/09/22 1147   02/08/22 0900  doxycycline (VIBRAMYCIN) 100 mg in sodium chloride 0.9 % 250 mL IVPB  Status:  Discontinued        100 mg 125 mL/hr over 120 Minutes Intravenous 2 times daily 02/08/22 0811 02/11/22 1255   02/08/22 0815  ceFEPIme (MAXIPIME) 2 g in sodium chloride 0.9 % 100 mL IVPB        2 g 200 mL/hr over 30 Minutes Intravenous  Once 02/08/22 0811 02/08/22 0855   02/08/22 0815  metroNIDAZOLE (FLAGYL) IVPB 500 mg        500 mg 100 mL/hr over 60 Minutes Intravenous  Once 02/08/22 0811 02/08/22 1147       Assessment/Plan: PID Multiple intraabdominal abscesses - S/P ex lap, abdominal washout with drainage of abscesses, repair SB serosa X 3 in conjunction with Dr. Shawnie Pons 6/27. JPs are serosanguinous, afebrile, WBC downtrending. - I am concerned about patients poor PO intake.  Pre-albumin pending. She is having bowel function and does not have an obstructive pattern on KUB from yesterday. If patient is agreeable I think a cortrak for enteral nutrition may be a good idea. Alternatively could consider TPN. -  I do think NPWT would help midline wound granulate and heal more efficiently.  - IS, encourage OOB/mobility.   ID - cultures taken in OR (NGTD), currently rocephin/ flagyl/doxycycline/ vancomycin VTE - lovenox FEN - IVF, Reg  Foley - none   Depression Iron deficiency anemia Obesity BMI 30.03  LOS: 10 days    Hosie Spangle, PA-C Trauma &  General Surgery Use [email or web address] to contact on call provider  02/19/2022

## 2022-02-19 NOTE — Progress Notes (Addendum)
Physical Therapy Treatment Patient Details Name: Paula Lucas MRN: [id number] DOB: 1990-12-13 Today's Date: 02/19/2022   History of Present Illness Pt is a 31 y/o female admitted 6/19 secondary to worsening abdominal pain. Pt left AMA on 6/18 from ICU for pelvic abscess and had drain placement during that admission. Pt reports she was [redacted] weeks pregnant at that time. Found to have tuboovarian abscess and pelvic abscess. Pt is s/p second drain placement and exchange and repositioning of first drain. Plan for cortrak 6/30. PMH includes STIs.    PT Comments    Pt received in supine, agreeable to therapy session after significant encouragement on second attempt (pt had been too drowsy after IV pain meds to initiate mobility). Pt initially refusing OOB mobility but after discussion on risks of immobility and benefits of mobility for strengthening and improved post-op outcomes, pt agreeable to [employer] training. Pt needing increased time, safety cues and up to minA for bed mobility and pivotal transfers to/from EOB<>BSC with HHA. Pt reports severe pain throughout despite premedication, dizziness and noted tachycardia to 140 bpm with step pivot transfer. BP stable pre/post mobility in supine. Plan to assess orthostatics next session and progress gait if BP/HR more stable. Pt continues to benefit from PT services to progress toward functional mobility goals.   Recommendations for follow up therapy are one component of a multi-disciplinary discharge planning process, led by the attending physician.  Recommendations may be updated based on patient status, additional functional criteria and insurance authorization.  Follow Up Recommendations  Home health PT (pending progress)     Assistance Recommended at Discharge Intermittent Supervision/Assistance  Patient can return home with the following Help with stairs or ramp for entrance;Assist for transportation;Assistance with cooking/housework;A little  help with bathing/dressing/bathroom;A little help with walking and/or transfers   Equipment Recommendations  Rolling walker (2 wheels) (may need to consider 3in1 pending gait progress; still non-ambulatory)    Recommendations for Other Services       Precautions / Restrictions Precautions Precautions: Fall Precaution Comments: tachy with mobility Restrictions Weight Bearing Restrictions: No     Mobility  Bed Mobility Overal bed mobility: Needs Assistance Bed Mobility: Rolling, Sidelying to Sit, Sit to Sidelying Rolling: Min assist Sidelying to sit: Min assist, HOB elevated     Sit to sidelying: Min assist General bed mobility comments: increased time, mod cues for improved body mechanics; via log roll technique for decreased pain; reliant on bed rail    Transfers Overall transfer level: Needs assistance Equipment used: 1 person hand held assist Transfers: Sit to/from Stand, Bed to chair/wheelchair/BSC Sit to Stand: Min assist   Step pivot transfers: Min assist       General transfer comment: mod cues for technique and minA for anterior weight shift and lift assist; verbalized fear of falls/heights frequently    Ambulation/Gait             Pre-gait activities: HR tachy to 140 bpm with pivotal transfer so defer gait progression; pt c/o dizziness as well.      Balance Overall balance assessment: Needs assistance Sitting-balance support: No upper extremity supported, Feet supported Sitting balance-Leahy Scale: Good     Standing balance support: Single extremity supported, During functional activity Standing balance-Leahy Scale: Poor Standing balance comment: external support needed                            Cognition Arousal/Alertness: Suspect due to medications, Awake/alert (Drowsy initially, alert after  mobility) Behavior During Therapy: Flat affect, Anxious Overall Cognitive Status: No family/caregiver present to determine baseline cognitive  functioning         General Comments: Reluctant to initiate, pt lethargic ~40 mins after IV pain meds given when first attempted but more alert upon second attempt ~2hrs after meds given. Pt initially refusing OOB but after transfer to EOB, pt c/o bladder urgency and agreeable to transfer to/from [facility]. Refusing chair, pt very anxious about prospect of pain and verbalized fear of falls and heights frequently. Poor insight into deficits/need to mobilize, pt states "I just had surgery 3 days ago" frequently. Per pt, plan to DC home with mother; will need to confirm.        Exercises General Exercises - Lower Extremity Ankle Circles/Pumps: AROM, Both, 10 reps, Supine Heel Slides: AROM, Both, 5 reps, Supine    General Comments General comments (skin integrity, edema, etc.): HR to 110 with sitting and tachy to 140 bpm with pivotal transfers to/from [facility]; pt received on RA with SpO2 reading 85%, once pt donned 3L [place] SpO2 improved to 92%; encouraged compliance with [place].      Pertinent Vitals/Pain Pain Assessment Pain Assessment: 0-10 Pain Score: 9  Pain Location: pelvic and drain placement region Pain Descriptors / Indicators: Grimacing, Moaning, Constant, Operative site guarding Pain Intervention(s): Limited activity within patient's tolerance, Monitored during session, Premedicated before session, Repositioned     PT Goals (current goals can now be found in the care plan section) Acute Rehab PT Goals Patient Stated Goal: to decrease pain PT Goal Formulation: With patient Time For Goal Achievement: 02/27/22 Progress towards PT goals: Progressing toward goals    Frequency    Min 3X/week      PT Plan Current plan remains appropriate (pending progress with mobility)       AM-PAC PT "6 Clicks" Mobility   Outcome Measure  Help needed turning from your back to your side while in a flat bed without using bedrails?: A Little Help needed moving from lying on your back to sitting on the  side of a flat bed without using bedrails?: A Little Help needed moving to and from a bed to a chair (including a wheelchair)?: A Little Help needed standing up from a chair using your arms (e.g., wheelchair or bedside chair)?: A Little Help needed to walk in hospital room?: Total Help needed climbing 3-5 steps with a railing? : Total 6 Click Score: 14    End of Session Equipment Utilized During Treatment: Gait belt;Oxygen Activity Tolerance: Patient limited by pain;Patient tolerated treatment well Patient left: in bed;with call bell/phone within reach;with bed alarm set;Other (comment) (pt requesting all 4 rails up; heels floated) Nurse Communication: Mobility status;Patient requests pain meds PT Visit Diagnosis: Other abnormalities of gait and mobility (R26.89);Pain;Muscle weakness (generalized) (M62.81);Difficulty in walking, not elsewhere classified (R26.2) Pain - Right/Left: Left Pain - part of body:  (abdomen)     Time: 8938-1017 PT Time Calculation (min) (ACUTE ONLY): 32 min  Charges:  $Therapeutic Activity: 23-37 mins                     Linet Brash P., PTA Acute Rehabilitation Services Secure Chat Preferred 9a-5:30pm Office: [phone]    Dorathy Kinsman [facility] 02/19/2022, 4:17 PM

## 2022-02-19 NOTE — Procedures (Signed)
Cortrak  Tube Type:  Cortrak - 43 inches Tube Location:  Left nare Initial Placement:  Stomach Secured by: Bridle Technique Used to Measure Tube Placement:  Marking at nare/corner of mouth Cortrak Secured At:  70 cm  Cortrak Tube Team Note:  Consult received to place a Cortrak feeding tube.   X-ray is required, abdominal x-ray has been ordered by the Cortrak team. Please confirm tube placement before using the Cortrak tube.   If the tube becomes dislodged please keep the tube and contact the Cortrak team at [email or web address] (password [email or web address]) for replacement.  If after hours and replacement cannot be delayed, place a NG tube and confirm placement with an abdominal x-ray.    Gabriele Loveland MS, RD, LDN Please refer to AMION for RD and/or RD on-call/weekend/after hours pager   

## 2022-02-19 NOTE — Progress Notes (Signed)
Pt reports 9/10 pain; contacted gen surgery MD who will put an extra dose of toradol on the [facility] to give.  MD also informed that pt says she refuses to wear the nasal cannula but RN found her at 85%. Contacted respiratory to consult; we put a venti mask on pt. And saturations now 91-93%.

## 2022-02-19 NOTE — Progress Notes (Signed)
SATURATION QUALIFICATIONS: (This note is used to comply with regulatory documentation for home oxygen)  Patient Saturations on Room Air at Rest = 85%  Patient Saturations on 3 Liters of oxygen while Ambulating = 92%  Please briefly explain why patient needs home oxygen: Pt hypoxic on RA at rest, SpO2 92% on 3L O2 [place] during pivotal transfers at bedside during PT session.

## 2022-02-19 NOTE — Progress Notes (Signed)
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 Progression despite appropriate abx requiring surgery  Unclear duration of coverage for "std type abx" but would plan at least 2 weeks from surgical date -- 2 weeks doxy; plan 3 weeks ceftriaxone/flagyl (potentially in clinic could change early to amox-clav)  Some abscesses couldn't get to during surgery and severe inflammatory adhesions present, so still anticipating long course of abx     #mrsa bacteriuria #back pain No recent foley catheter Complained of back pain Bcx here on admission and repeat have been engative Will need to workup staph aureus in urine. Unless she has recent foley would attribute this to occult bacteremia. She had negative 6/15 and 6/19 bcx Lumbar mri no OM or soft tissue infection 6/23 tte no vegetation on valve  6/26 exam with rather high left sided para-thoraci tenderness  ------- 6/30 assessment Pending mri t  spine  Likely workup and tx more conservative in this case but guideline is rather unclear Awaiting mri Continue vanc for now -- 2 weeks mrsa coverage if mri all negative    Recommendation: -await mri -- if no paravertebral/paraspinal process or bone joint abnormality would finish the 2 weeks of "occult mrsa" coverage, counting vancomycin days here, with 7 more days linezolid 600 mg po bid -plan 2 weeks doxycycline from 6/27 until 7/10 -3 weeks of flagyl and ceftriaxone until 7/18 -discussed with primary team    OPAT Orders Discharge antibiotics to be given via PICC line Discharge antibiotics: iv ceftriaxone, po flagyl/doxy/linezolid (po meds duration above) OPAT Order Details             .Outpatient Parenteral Antibiotic Therapy Consult  Until discontinued       Provider:  (Not yet assigned)  Question Answer Comment  Antibiotic Ceftriaxone (Rocephin) IVPB   Indications for use Intra-abdominal abscesses   End Date 03/09/2022                 Duration: 3 weeks  End Date: 7/18  [facility] Care Per Protocol:  Home health RN for IV administration and teaching; PICC line care and labs.    Labs weekly while on IV antibiotics: _x_ CBC with differential __ BMP _x_ CMP _x_ CRP __ ESR __ Vancomycin trough  __ CK  _x_ Please pull PIC at completion of IV antibiotics __ Please leave PIC in place until doctor has seen patient or been notified  Fax weekly labs to [phone]  Clinic Follow Up Appt: 7/11 @ 345 with Dr Renold Don  @  [facility] [address], [place], [place] [address] Phone: [phone]      I spent more than 50 minute reviewing data/chart, and coordinating care and >50% direct face to face time providing counseling/discussing diagnostics/treatment plan with patient    Principal Problem:   Pelvic abscess  Active Problems:   Sepsis (HCC)   MRSA (methicillin resistant staph aureus) culture positive   Hepatic abscess   Hx of Fitz-Hugh-Curtis  syndrome   Protein-calorie malnutrition (HCC)   Allergies  Allergen Reactions   Fish Allergy Anaphylaxis    Severe allergic reaction to all seafoods   Motrin [Ibuprofen] Anaphylaxis    Pt tolerated Ketorolac 04/02/18   Mushroom Extract Complex Anaphylaxis   Shellfish-Derived Products Anaphylaxis   Tylenol [Acetaminophen] Anaphylaxis    Scheduled Meds:  Chlorhexidine Gluconate Cloth  6 each Topical Daily   doxycycline  100 mg Oral Q12H   enoxaparin (LOVENOX) injection  40 mg Subcutaneous Q24H   feeding supplement  237 mL Oral BID BM   feeding supplement (PROSource TF)  45 mL Per Tube BID   gabapentin  300 mg Oral TID   ketorolac  15 mg Intravenous Q6H   lidocaine  1 patch Transdermal Daily   linezolid  600 mg Oral Q12H   metroNIDAZOLE  500 mg Oral Q12H   multivitamin with minerals  1 tablet Oral Daily   nicotine  21 mg Transdermal Daily   oxyCODONE  20 mg Oral Q12H   polyethylene glycol  17 g Oral BID   senna-docusate  1 tablet Oral Q2000   sodium chloride flush  10-40 mL Intracatheter Q12H   traZODone  50 mg Oral QHS   Continuous Infusions:  cefTRIAXone (ROCEPHIN)  IV 2 g (02/18/22 2139)   feeding supplement (OSMOLITE 1.5 CAL)     ferric gluconate (FERRLECIT) IVPB 250 mg (02/19/22 1431)   PRN Meds:.bisacodyl, diphenhydrAMINE **OR** diphenhydrAMINE, HYDROmorphone (DILAUDID) injection, hydrOXYzine, lip balm, LORazepam, menthol-cetylpyridinium, ondansetron **OR** ondansetron (ZOFRAN) IV, polyvinyl alcohol, prochlorperazine, simethicone, sodium chloride flush   SUBJECTIVE: Sleepy  Some abd pain No n/v/diarrhea No rash No headache  Pending t-spine mri   Afebrile/wbc down   Review of Systems: ROS All other ROS was negative, except mentioned above     OBJECTIVE: Vitals:   02/19/22 0500 02/19/22 0600 02/19/22 0900 02/19/22 1203  BP:  (!) 130/92 (!) 129/93 (!) 120/92  Pulse:   (!) 105 100  Resp:  20 20   Temp:   99.5 F (37.5 C) 99.3 F (37.4 C)  TempSrc:    Oral Axillary  SpO2: 95% 95% 90% 90%  Weight:      Height:       Body mass index is 30.03 kg/m.  Physical Exam  General/constitutional: no distress; sleepy; cooperative HEENT: Normocephalic, PER, Conj Clear, EOMI, Oropharynx clear Neck supple CV: rrr no mrg Lungs: clear to auscultation, normal respiratory effort Abd: Soft, wound dressing clean/dry -- jp drains with serosanguinous output  Ext: no edema Skin: No Rash Neuro: nonfocal MSK: no peripheral joint swelling/tenderness/warmth; didn't examine back today    Lab Results Lab Results  Component Value Date   WBC 9.7 02/19/2022   HGB 8.2 (L) 02/19/2022   HCT 25.3 (L) 02/19/2022  MCV 89.1 02/19/2022   PLT 611 (H) 02/19/2022    Lab Results  Component Value Date   CREATININE 0.79 02/19/2022   BUN 6 02/19/2022   NA 140 02/19/2022   K 3.8 02/19/2022   CL 100 02/19/2022   CO2 29 02/19/2022    Lab Results  Component Value Date   ALT 22 02/09/2022   AST 26 02/09/2022   ALKPHOS 48 02/09/2022   BILITOT 0.4 02/09/2022      Microbiology: Recent Results (from the past 240 hour(s))  Aerobic/Anaerobic Culture w Gram Stain (surgical/deep wound)     Status: None   Collection Time: 02/10/22  1:47 PM   Specimen: Abscess  Result Value Ref Range Status   Specimen Description ABSCESS  Final   Special Requests POST CT GUIDED RIGHT TRANSGLUTEAL DRAIN PLACEMENT  Final   Gram Stain   Final    FEW WBC PRESENT,BOTH PMN AND MONONUCLEAR NO ORGANISMS SEEN    Culture   Final    No growth aerobically or anaerobically. Performed at [facility], [place] [address] [place], [place] [address]    Report Status 02/15/2022 FINAL  Final  Surgical pcr screen     Status: None   Collection Time: 02/16/22  8:04 AM   Specimen: Nasal Mucosa; Nasal Swab  Result Value Ref Range Status   MRSA, PCR NEGATIVE NEGATIVE Final   Staphylococcus aureus NEGATIVE NEGATIVE Final    Comment: (NOTE) The Xpert SA Assay (FDA approved for NASAL  specimens in patients 55 years of age and older), is one component of a comprehensive surveillance program. It is not intended to diagnose infection nor to guide or monitor treatment. Performed at [facility], [place] [address] [place], [place] [address]   Aerobic/Anaerobic Culture w Gram Stain (surgical/deep wound)     Status: None (Preliminary result)   Collection Time: 02/16/22 10:29 AM   Specimen: PATH Other; Body Fluid  Result Value Ref Range Status   Specimen Description WOUND  Final   Special Requests ABDOMINAL  Final   Gram Stain NO WBC SEEN NO ORGANISMS SEEN   Final   Culture   Final    NO GROWTH 3 DAYS NO ANAEROBES ISOLATED; CULTURE IN PROGRESS FOR 5 DAYS Performed at [facility], [place] [address] [place], [place] [address]    Report Status PENDING  Incomplete  Aerobic/Anaerobic Culture w Gram Stain (surgical/deep wound)     Status: None (Preliminary result)   Collection Time: 02/16/22 11:36 AM   Specimen: PATH Other; Body Fluid  Result Value Ref Range Status   Specimen Description WOUND  Final   Special Requests ABDOMINAL WASHING  Final   Gram Stain NO WBC SEEN NO ORGANISMS SEEN   Final   Culture   Final    NO GROWTH 3 DAYS NO ANAEROBES ISOLATED; CULTURE IN PROGRESS FOR 5 DAYS Performed at [facility], [address], [place], [place] [address]    Report Status PENDING  Incomplete     Serology:   Imaging: If present, new imagings (plain films, ct scans, and mri) have been personally visualized and interpreted; radiology reports have been reviewed. Decision making incorporated into the Impression / Recommendations.  6/25 ct abd pelv with contrast FINDINGS: Lower chest: Persistent moderate-sized pleural effusions with overlying atelectasis.   Hepatobiliary: Small residual lentiform rim enhancing abscess at the dome of the liver anteriorly measuring a maximum of 5.6 x 1.1 cm. This previously measured approximately 9.3 x 1.1 cm.  Inferiorly  there is a persistent perihepatic abscess. It measures approximately 9.5 x 3.1 cm and previously measured 10.1 x 3.7 cm. No worrisome hepatic lesions or intrahepatic biliary dilatation. The gallbladder is unremarkable. No common bile duct dilatation.   Pancreas: No mass, inflammation or ductal dilatation.   Spleen: Normal size.  No focal lesions.   Adrenals/Urinary Tract: Adrenal glands and kidneys are unremarkable and stable. The bladder is grossly normal.   Stomach/Bowel: The stomach, duodenum, small bowel and colon grossly normal.   Vascular/Lymphatic: The aorta and branch vessels are patent. The major venous structures are patent. Stable borderline enlarged mesenteric and retroperitoneal lymph nodes, likely reactive/inflammatory.   Reproductive: The uterus and ovaries are stable.   Other: Percutaneous drainage catheter in the upper right pelvis with a persistent rim enhancing abscess measuring approximately 8.8 x 3.5 cm and previously measuring 9.8 x 5.6 cm. A second drainage catheter in the lower right cul-de-sac area with a small persistent rim enhancing abscess measuring 4.1 x 2.0 cm. This previously measured 5.2 x 4.9 cm.   Left-sided rounded pelvic abscess measures 3.6 x 2.9 cm on image 71/3. This previously measured 3.5 x 2.7 cm.   Persistent diffuse mesenteric and subcutaneous inflammations/edema.   Musculoskeletal: No significant bony findings.   IMPRESSION: 1. Persistent moderate-sized pleural effusions with overlying atelectasis. 2. Persistent but slightly smaller undrained perihepatic abscesses. 3. Two right-sided pelvic drainage catheters with persistent but smaller surrounding abscesses. 4. Left-sided undrained pelvic abscess is slightly smaller also. 5. Persistent diffuse mesenteric and subcutaneous inflammation. 6. Stable borderline enlarged mesenteric and retroperitoneal lymph nodes, likely reactive/inflammatory.   6/23 mri lumbar spine 1. No MRI evidence for  acute infection within the lumbar spine. 2. Left subarticular disc protrusion at L5-S1, contacting the descending left S1 nerve root in the left lateral recess. 3. Small left subarticular disc protrusion at L2-3 with borderline mild left lateral recess stenosis. 4. Multiloculated collections/abscesses within the partially visualized pelvis, better characterized on recent CT.   6/23 tte  1. Left ventricular ejection fraction, by estimation, is 60 to 65%. The left ventricle has normal function. The left ventricle has no regional wall motion abnormalities. Left ventricular diastolic parameters were normal.   2. Right ventricular systolic function is normal. The right ventricular size is normal.   3. The mitral valve is normal in structure. Trivial mitral valve  regurgitation. No evidence of mitral stenosis.   4. The aortic valve is normal in structure. Aortic valve regurgitation is not visualized. No aortic stenosis is present.    Jabier Mutton, [facility] [place] [phone] pager    02/19/2022, 4:25 PM

## 2022-02-19 NOTE — Progress Notes (Signed)
Initial Nutrition Assessment  DOCUMENTATION CODES:   Not applicable  INTERVENTION:   Tube Feeds via Cortrak: Start Osmolite 1.5 @ 20 mL/hr and advance by 10 mL q8h, as tolerated, to goal rate of 60 mL/hr (1440 mL/day) 45 mL ProSource TF - BID 150 mL free water flush q4h  Provides 18841 kcal, 112 gm protein, 1997 mL total free water (TF + flush) daily.  Monitor magnesium, potassium, and phosphorus BID for at least 3 days, MD to replete as needed, as pt is at risk for refeeding syndrome given poor PO > 7 days. Continue Multivitamin w/ minerals daily Discontinue Ensure Enlive  NUTRITION DIAGNOSIS:   Inadequate oral intake related to nausea, poor appetite as evidenced by per patient/family report.  GOAL:   Patient will meet greater than or equal to 90% of their needs  MONITOR:   PO intake, Labs, Weight trends, TF tolerance, Skin  REASON FOR ASSESSMENT:   Consult Assessment of nutrition requirement/status, Calorie Count  ASSESSMENT:   31 y.o. female presented to the ED with nausea, vomiting, and abdominal pain. No significant past medical history. Pt admitted with a pelvic abscess.   6/27 - ex-lap s/p abdominal washout with abscess 6/30 - Cortrak placed   Discussed case with RN outside of room. RN reports that pt is not eating at all and has continued to decline supplements. RN reports that pt complains of nausea and that medicine has not been helping.   Pt reports that she is nauseous all the time, regardless. Pt not willing to conversant with this RD. Discussed placement of Cortrak due to ongoing poor PO intake; pt agreeable. Pt requested that this RD call her mother, this RD spoke with the mother over the phone. Mother in agreement as long as the pt was.   Discussed with team that pt was agreeable to Cortrak. RD placed Cortrak order.  Medications reviewed and include: Doxycycline, MVI, Miralax, Senokot, IV Ferric Gluconate, IV antibiotics Labs reviewed.   NUTRITION -  FOCUSED PHYSICAL EXAM:  Deferred to follow-up.  Diet Order:   Diet Order             Diet regular Room service appropriate? Yes; Fluid consistency: Thin  Diet effective now                   EDUCATION NEEDS:   Not appropriate for education at this time  Skin:  Skin Assessment: Reviewed RN Assessment  Last BM:  6/27  Height:   Ht Readings from Last 1 Encounters:  02/16/22 5\' 3"  (1.6 m)    Weight:   Wt Readings from Last 1 Encounters:  02/16/22 76.9 kg    Ideal Body Weight:  52.3 kg  BMI:  Body mass index is 30.03 kg/m.  Estimated Nutritional Needs:   Kcal:  2100-2300  Protein:  105-120 grams  Fluid:  >/= 2.1 L    02/18/22 RD, LDN Clinical Dietitian See [facility] for contact information.

## 2022-02-19 NOTE — Progress Notes (Signed)
Overnight: No acute events overnight  Subjective: Patient seen and evaluated bedside. Sleeping comfortably. Reports diaphoresis overnight she suspects due to abx. Doing "ok". Has not had BM for a few days. Passing flatus. Pain better controlled today. Continues to endorse mild pain in lower back.  Objective:  Vital signs in last 24 hours: Vitals:   02/19/22 0400 02/19/22 0500 02/19/22 0600 02/19/22 0900  BP: 117/79  (!) 130/92 (!) 129/93  Pulse:    (!) 105  Temp: 98.6 F (37 C)   99.5 F (37.5 C)  Resp: 18  20 20   Height:      Weight:      SpO2: 95% 95% 95% 90%  TempSrc: Oral   Oral  BMI (Calculated):       Constitutional:      General: She is not in acute distress.  Resting in bed, appears more comfortable than yesterday. HENT:     Head: Normocephalic and atraumatic.  Cardiovascular:     Rate and Rhythm: Tachycardic    Heart sounds: Normal heart sounds.  Pulmonary:     Effort: Pulmonary effort is normal.  Tachypneic Abdominal:     Comments: Tenderness to palpation diffusely, most prominent on right side, midline surgical incision with dressing in place, no strikethrough, 2 JP drains right lower abdomen with serosanguineous output Musculoskeletal:     Right lower leg: No edema.     Left lower leg: No edema.  Skin:    General: Warm and dry Neurological:     General: No focal deficit present.     Mental Status: She is alert.     Motor: Motor function is intact.  Psychiatric:        Behavior: Behavior normal. Behavior is cooperative.        Cognition and Memory: Cognition normal.   Assessment/Plan:  Principal Problem:   Pelvic abscess  Active Problems:   Sepsis (HCC)   Left lower quadrant abdominal pain   MRSA (methicillin resistant staph aureus) culture positive   Hepatic abscess  Paula Lucas is a 31 year old female with a past medical history of recent admission for tubo-ovarian abscess complicated by septic shock (02/04/22 - 02/07/22) with previous history of  tubo-ovarian abscess 2/2 N. Gonorrhea in 2019 who presents to the ED with c/o nausea, vomiting and abdominal pain, currently admitted for multiple pelvic abscesses s/p wash out with ex lap and JP drains placement.   Multiple pelvic abscesses Tubo-ovarian abscesses Patient is 3 day postop status post ex lap with washout and bowel adhesion lysis.  Overall appears clinically improved proving.  Remains afebrile and normalization of WBC.  No signs of bowel obstruction on KUB yesterday.  Minimal drain output recorded. Interestingly, all of her aspirated abscess fluid were negative for organism or WBC. -Overall pain is better controlled with OxyContin and Dilaudid as needed. -Patient agrees with with trying the wound VAC.  I reached out to surgery.  Will also ask surgery if her JP drains will be removed before discharge. -Continue doxycycline, ceftriaxone and Flagyl.  Will reach out to ID regarding duration. -Continue working with PT/OT -Core track placed for nutrition -Increase MiraLAX and added Senokot for bowel regimen.  Discontinue docusate  Occult MRSA bacteremia In the setting of MRSA bacteriuria from last admission.  Blood culture 6/19 was negative to date.  Lumbar MRI done this admission was negative for osteo or abscess.  Will obtain thoracic MRI today, which will help 7/19 determine the duration of her vancomycin. -Continue vancomycin per ID recommendation -  She has a PICC line placed yesterday  Iron deficiency anemia 1200 mg of iron deficient.  She has not received blood during this admission.  We will start IV iron since patient is not bacteremic. 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  We started duloxetine yesterday.  We will continue to encourage patient daily.  Chaplain visit is also helping.  She will also need a lot of support from  her family as well.  Diet: Regular.  Now place cortrack for low p.o. intake. DVT: Lovenox IVF: N/A CODE STATUS full Cardiac monitor: N/A  Barriers to Discharge: Medical stability, pain control and wound care.  Signed: Doran Stabler, DO Internal Medicine Residency My pager: [phone]   After 5pm on weekdays and 1pm on weekends: On Call pager [phone]

## 2022-02-20 ENCOUNTER — Inpatient Hospital Stay (HOSPITAL_COMMUNITY): Payer: Medicaid Other

## 2022-02-20 LAB — GLUCOSE, CAPILLARY
Glucose-Capillary: 105 mg/dL — ABNORMAL HIGH (ref 70–99)
Glucose-Capillary: 111 mg/dL — ABNORMAL HIGH (ref 70–99)
Glucose-Capillary: 118 mg/dL — ABNORMAL HIGH (ref 70–99)
Glucose-Capillary: 118 mg/dL — ABNORMAL HIGH (ref 70–99)

## 2022-02-20 LAB — CBC
HCT: 25.3 % — ABNORMAL LOW (ref 36.0–46.0)
Hemoglobin: 8 g/dL — ABNORMAL LOW (ref 12.0–15.0)
MCH: 28.2 pg (ref 26.0–34.0)
MCHC: 31.6 g/dL (ref 30.0–36.0)
MCV: 89.1 fL (ref 80.0–100.0)
Platelets: 583 10*3/uL — ABNORMAL HIGH (ref 150–400)
RBC: 2.84 MIL/uL — ABNORMAL LOW (ref 3.87–5.11)
RDW: 17.4 % — ABNORMAL HIGH (ref 11.5–15.5)
WBC: 9.9 10*3/uL (ref 4.0–10.5)
nRBC: 0 % (ref 0.0–0.2)

## 2022-02-20 LAB — BASIC METABOLIC PANEL
Anion gap: 4 — ABNORMAL LOW (ref 5–15)
BUN: 6 mg/dL (ref 6–20)
CO2: 30 mmol/L (ref 22–32)
Calcium: 8.2 mg/dL — ABNORMAL LOW (ref 8.9–10.3)
Chloride: 102 mmol/L (ref 98–111)
Creatinine, Ser: 0.72 mg/dL (ref 0.44–1.00)
GFR, Estimated: >60 mL/min (ref >60)
Glucose, Bld: 99 mg/dL (ref 70–99)
Potassium: 3.4 mmol/L — ABNORMAL LOW (ref 3.5–5.1)
Sodium: 136 mmol/L (ref 135–145)

## 2022-02-20 LAB — MAGNESIUM: Magnesium: 1.7 mg/dL (ref 1.7–2.4)

## 2022-02-20 LAB — PHOSPHORUS: Phosphorus: 3.9 mg/dL (ref 2.5–4.6)

## 2022-02-20 MED ORDER — ADULT MULTIVITAMIN LIQUID CH
15.0000 mL | Freq: Every day | ORAL | Status: DC
Start: 2022-02-20 — End: 2022-02-25
  Administered 2022-02-20 – 2022-02-23 (×3): 15 mL
  Filled 2022-02-20 (×6): qty 15

## 2022-02-20 MED ORDER — POLYETHYLENE GLYCOL 3350 17 G PO PACK
17.0000 g | PACK | Freq: Two times a day (BID) | ORAL | Status: DC
  Administered 2022-02-20 – 2022-02-25 (×4): 17 g via NASOGASTRIC
  Filled 2022-02-20 (×8): qty 1

## 2022-02-20 MED ORDER — POTASSIUM CHLORIDE CRYS ER 20 MEQ PO TBCR
40.0000 meq | EXTENDED_RELEASE_TABLET | Freq: Two times a day (BID) | ORAL | Status: DC
  Administered 2022-02-20: 40 meq via ORAL
  Filled 2022-02-20: qty 2

## 2022-02-20 MED ORDER — MAGNESIUM SULFATE IN D5W 1-5 GM/100ML-% IV SOLN
1.0000 g | Freq: Once | INTRAVENOUS | Status: AC
Start: 2022-02-20 — End: 2022-02-20
  Administered 2022-02-20: 1 g via INTRAVENOUS
  Filled 2022-02-20: qty 100

## 2022-02-20 MED ORDER — DOCUSATE SODIUM 50 MG/5ML PO LIQD
100.0000 mg | Freq: Every day | ORAL | Status: DC
  Administered 2022-02-20: 100 mg
  Filled 2022-02-20: qty 10

## 2022-02-20 MED ORDER — POTASSIUM CHLORIDE 10 MEQ/100ML IV SOLN
10.0000 meq | Freq: Once | INTRAVENOUS | Status: AC
  Administered 2022-02-20: 10 meq via INTRAVENOUS
  Filled 2022-02-20: qty 100

## 2022-02-20 MED ORDER — FUROSEMIDE 10 MG/ML IJ SOLN
20.0000 mg | Freq: Once | INTRAMUSCULAR | Status: AC
  Administered 2022-02-20: 20 mg via INTRAVENOUS
  Filled 2022-02-20: qty 2

## 2022-02-20 MED ORDER — POTASSIUM CHLORIDE 10 MEQ/100ML IV SOLN
10.0000 meq | INTRAVENOUS | Status: AC
  Administered 2022-02-20: 10 meq via INTRAVENOUS
  Filled 2022-02-20: qty 100

## 2022-02-20 MED ORDER — FUROSEMIDE 10 MG/ML IJ SOLN
20.0000 mg | Freq: Once | INTRAMUSCULAR | Status: AC
Start: 2022-02-20 — End: 2022-02-20
  Administered 2022-02-20: 20 mg via INTRAVENOUS
  Filled 2022-02-20: qty 2

## 2022-02-20 MED ORDER — TRAZODONE HCL 50 MG PO TABS
50.0000 mg | ORAL_TABLET | Freq: Every day | ORAL | Status: DC
  Administered 2022-02-20 – 2022-02-24 (×4): 50 mg via NASOGASTRIC
  Filled 2022-02-20 (×5): qty 1

## 2022-02-20 MED ORDER — SIMETHICONE 80 MG PO CHEW: 80.0000 mg | CHEWABLE_TABLET | Freq: Four times a day (QID) | ORAL | Status: DC | PRN

## 2022-02-20 MED ORDER — PHENOL 1.4 % MT LIQD
1.0000 | OROMUCOSAL | Status: DC | PRN
Start: 2022-02-20 — End: 2022-02-21

## 2022-02-20 MED ORDER — SENNOSIDES-DOCUSATE SODIUM 8.6-50 MG PO TABS
1.0000 | ORAL_TABLET | Freq: Every day | ORAL | Status: DC
Start: 2022-02-20 — End: 2022-02-28
  Administered 2022-02-20 – 2022-02-23 (×3): 1 via ORAL
  Filled 2022-02-20 (×6): qty 1

## 2022-02-20 NOTE — Progress Notes (Signed)
Patient ID: Paula Lucas, female   DOB: Sep 16, 1990, 31 y.o.   MRN: [id number] 4 Days Post-Op    Subjective: Patient feels her abd pain is slowly improving. She is having flatus. She denies BM since surgery. Tolerating TF at 30 cc/hr. Denies SOB. States she is pulling 750 cc on iS.   Objective: Vital signs in last 24 hours: Temp:  [97.8 F (36.6 C)-99.5 F (37.5 C)] 98.9 F (37.2 C) (07/01 0600) Pulse Rate:  [100-114] 100 (07/01 0600) Resp:  [20-21] 20 (07/01 0600) BP: (113-132)/(82-99) 129/98 (07/01 0600) SpO2:  [90 %-93 %] 92 % (07/01 0600) Last BM Date : 02/16/22  Intake/Output from previous day: 06/30 0701 - 07/01 0700 In: 10 [I.V.:10] Out: 675 [Urine:650; Drains:25] Intake/Output this shift: No intake/output data recorded. Alert, non-toxic appearing. Pulm non-labored respirations on venturi mask. No crackles anteriorly or laterally. Exam limited by patient discomfort and body habitus. I did not auscultate posterior chest wall. GI: soft, less tender overall ; midline wound dressind c/d/I, JPx2 with SS drainge  Lab Results: CBC  Recent Labs    02/19/22 0440 02/20/22 0420  WBC 9.7 9.9  HGB 8.2* 8.0*  HCT 25.3* 25.3*  PLT 611* 583*   BMET Recent Labs    02/19/22 0440 02/20/22 0420  NA 140 136  K 3.8 3.4*  CL 100 102  CO2 29 30  GLUCOSE 90 99  BUN 6 6  CREATININE 0.79 0.72  CALCIUM 8.5* 8.2*   PT/INR No results for input(s): "LABPROT", "INR" in the last 72 hours. ABG No results for input(s): "PHART", "HCO3" in the last 72 hours.  Invalid input(s): "PCO2", "PO2"  Studies/Results: DG Abd Portable 1V  Result Date: 02/19/2022 CLINICAL DATA:  Provided history: Encounter for feeding tube placement. EXAM: PORTABLE ABDOMEN - 1 VIEW COMPARISON:  Abdominal radiograph 02/18/2022. FINDINGS: An enteric tube passes below the level left hemidiaphragm with tip projecting in the expected location of the pylorus or first portion of the duodenum. Nonobstructive bowel gas  pattern within the relies abdomen. No acute bony abnormality identified. IMPRESSION: Enteric tube present with tip projecting in the expected location of the pylorus or first portion of the duodenum. Nonspecific, nonobstructive bowel gas pattern within the visualized abdomen. Electronically Signed   By: Jackey Loge D.O.   On: 02/19/2022 14:20   DG CHEST PORT 1 VIEW  Result Date: 02/18/2022 CLINICAL DATA:  Central line placement EXAM: PORTABLE CHEST 1 VIEW COMPARISON:  Radiograph 09/06/2021 FINDINGS: Right upper extremity PICC tip crosses the midline, across the brachiocephalic confluence, tip overlies the left brachiocephalic vein. Borderline enlarged cardiac silhouette. Bibasilar consolidations, left greater than right. Small bilateral pleural effusions. Low lung volumes. No pneumothorax. No acute osseous abnormality. IMPRESSION: Right upper extremity PICC tip courses across the brachiocephalic confluence, tip overlying the left brachiocephalic vein. Recommend repositioning. Bilateral pleural effusions with adjacent bibasilar atelectasis, left greater than right. Low lung volumes. These results will be called to the ordering clinician or representative by the Radiologist Assistant, and communication documented in the PACS or [employer]. Electronically Signed   By: Caprice Renshaw M.D.   On: 02/18/2022 18:18   [place] EKG SITE RITE  Result Date: 02/18/2022 If Site Rite image not attached, placement could not be confirmed due to current cardiac rhythm.  DG Abd Portable 1V  Result Date: 02/18/2022 CLINICAL DATA:  Abdominal distension. EXAM: PORTABLE ABDOMEN - 1 VIEW COMPARISON:  Abdominal CT February 14, 2022 FINDINGS: Drainage catheters again seen in the lower abdomen/pelvis. No significant  change in the appearance of the bowel pattern. No evidence of small-bowel obstruction. IMPRESSION: Stable appearance of the bowel pattern. Electronically Signed   By: Ted Mcalpine M.D.   On: 02/18/2022 12:52     Anti-infectives: Anti-infectives (From admission, onward)    Start     Dose/Rate Route Frequency Ordered Stop   02/19/22 2200  linezolid (ZYVOX) tablet 600 mg        600 mg Oral Every 12 hours 02/19/22 1505 02/27/22 0959   02/17/22 1130  fluconazole (DIFLUCAN) tablet 200 mg        200 mg Oral Daily 02/17/22 1032 02/17/22 1145   02/13/22 2200  vancomycin (VANCOCIN) IVPB 1000 mg/200 mL premix  Status:  Discontinued        1,000 mg 200 mL/hr over 60 Minutes Intravenous Every 12 hours 02/13/22 0817 02/19/22 1505   02/13/22 0900  vancomycin (VANCOREADY) IVPB 1750 mg/350 mL        1,750 mg 175 mL/hr over 120 Minutes Intravenous  Once 02/13/22 0809 02/13/22 1258   02/12/22 1315  doxycycline (VIBRA-TABS) tablet 100 mg        100 mg Oral Every 12 hours 02/12/22 1220 03/02/22 2359   02/12/22 1000  doxycycline (VIBRA-TABS) tablet 100 mg  Status:  Discontinued        100 mg Oral Daily 02/12/22 0804 02/12/22 1219   02/11/22 1345  doxycycline (VIBRA-TABS) tablet 100 mg        100 mg Oral  Once 02/11/22 1255 02/11/22 1346   02/11/22 1345  metroNIDAZOLE (FLAGYL) tablet 500 mg        500 mg Oral Every 12 hours 02/11/22 1255 03/09/22 2359   02/09/22 2051  metroNIDAZOLE (FLAGYL) IVPB 500 mg  Status:  Discontinued        500 mg 100 mL/hr over 60 Minutes Intravenous Every 12 hours 02/09/22 1106 02/11/22 1255   02/09/22 1600  cefTRIAXone (ROCEPHIN) 2 g in sodium chloride 0.9 % 100 mL IVPB        2 g 200 mL/hr over 30 Minutes Intravenous Every 24 hours 02/09/22 1147 03/09/22 2359   02/08/22 1700  metroNIDAZOLE (FLAGYL) IVPB 500 mg  Status:  Discontinued        500 mg 100 mL/hr over 60 Minutes Intravenous Every 8 hours 02/08/22 1205 02/09/22 1106   02/08/22 1630  ceFEPIme (MAXIPIME) 2 g in sodium chloride 0.9 % 100 mL IVPB  Status:  Discontinued        2 g 200 mL/hr over 30 Minutes Intravenous Every 8 hours 02/08/22 0822 02/09/22 1147   02/08/22 0900  doxycycline (VIBRAMYCIN) 100 mg in sodium chloride  0.9 % 250 mL IVPB  Status:  Discontinued        100 mg 125 mL/hr over 120 Minutes Intravenous 2 times daily 02/08/22 0811 02/11/22 1255   02/08/22 0815  ceFEPIme (MAXIPIME) 2 g in sodium chloride 0.9 % 100 mL IVPB        2 g 200 mL/hr over 30 Minutes Intravenous  Once 02/08/22 0811 02/08/22 0855   02/08/22 0815  metroNIDAZOLE (FLAGYL) IVPB 500 mg        500 mg 100 mL/hr over 60 Minutes Intravenous  Once 02/08/22 0811 02/08/22 1147       Assessment/Plan: PID Multiple intraabdominal abscesses - S/P ex lap, abdominal washout with drainage of abscesses, repair SB serosa X 3 in conjunction with Dr. Shawnie Pons 6/27.  - JPs are serosanguinous, afebrile, WBC downtrending  - ongoing nausea and poor  appetite, pre-dating surgery. +flatus. No Bm since surgery. Cortrak placed and continued TF started 6/30. Advance as tolerated -  new O2 requirement w/ venturi mask in place. Good wave form on monitor. I ordered CXR to compare to CXR from 2 days ago showing pleural effusions.  -  I do think NPWT would help midline wound granulate and heal more efficiently.  -  IS, encourage OOB/mobility.   - ID following for antibiotic guidance, appreciate their assistance   ID - cultures taken in OR (NGTD), currently rocephin/ flagyl/doxycycline/ vancomycin; per ID VTE - lovenox FEN - IVF, Reg  Foley - none   Depression Iron deficiency anemia Obesity BMI 30.03  LOS: 11 days    Hosie Spangle, PA-C Trauma & General Surgery Use [email or web address] to contact on call provider  02/20/2022

## 2022-02-20 NOTE — Progress Notes (Signed)
Offered to open window shades; pt states she is scared of heights and does not want opened.

## 2022-02-20 NOTE — Progress Notes (Addendum)
Overnight: Required additional dose of toradol overnight for pain control.  Subjective:   Patient evaluated at bedside. She is feeling okay. Her stomach hurts at nighttime more often as she turns around in her sleep which elicits pain. Also when it is palpated by anyone. She is passing flatus.   States she choked on her antibiotic last night. She is not feeling short of breath currently. She is okay with getting thoracic MRI as long as she can receive ativan beforehand.  Objective:  Vital signs in last 24 hours: Vitals:   02/19/22 2028 02/19/22 2340 02/20/22 0000 02/20/22 0600  BP:  (!) 132/99  (!) 129/98  Pulse:  (!) 110  100  Temp:  98.9 F (37.2 C)  98.9 F (37.2 C)  Resp:  (!) 21 20 20   Height:      Weight:      SpO2: 93% 90%  92%  TempSrc:  Oral  Axillary  BMI (Calculated):       Physical Exam: General: young female, resting supine in bed, NAD. CV: tachycardic rate with normal rhythm, no m/r/g. Pulm: Bibasilar crackles noted on exam. Currently satting 94% on 8L Venturi mask, desats to ~86-88% on RA when speaking. Abdomen: TTP diffusely, primarily on R side. Midline incision with dressing in place c/d/I. 2 JP drains in RLQ with mild serosanguinous output. MSK: trace edema bilaterally Skin: warm and dry Neuro: AAOx3, no focal deficits noted. Psych: normal mood and affect.   Assessment/Plan:  Principal Problem:   Pelvic abscess  Active Problems:   Sepsis (HCC)   Left lower quadrant abdominal pain   MRSA (methicillin resistant staph aureus) culture positive   Hepatic abscess  Ms. Ewald is a 31 year old female with a past medical history of recent admission for tubo-ovarian abscess complicated by septic shock (02/04/22 - 02/07/22) with previous history of tubo-ovarian abscess 2/2 N. Gonorrhea in 2019 who presents to the ED with c/o nausea, vomiting and abdominal pain, currently admitted for multiple pelvic abscesses s/p wash out with ex lap and JP drains placement.    Multiple pelvic abscesses Tubo-ovarian abscesses POD 4 status post ex lap with washout and bowel adhesion lysis. Appears to be clinically improving, although pain control has been difficult. Okay to receive extra dose of toradol for pain control overnight if needed. Remains afebrile with no leukocytosis today. -pain control with oxycontin BID, prn dilaudid, q6 toradol -continue doxycycline for 2 weeks post-op -continue rocephin and flagyl for 3 weeks post-op -ID following, appreciate recs -appreciate OBGYN and surgery recs -continue working with PT/OT -cortrak in place for nutrition, transitioned most meds to per tube -miralax, senna-s qhs, and dulcolax suppository prn  Hypoxia Patient was transitioned to venturi mask due to hypoxia to low 80s yesterday. She is still requiring oxygen through venturi mask today (unable to use Glide given cortrak in place). She does have some bibasilar crackles on exam. CXR showing new mild pulmonary edema and small bilateral pleural effusions with adjacent atelectasis. This is likely the source of hypoxia. She did mention choking on her antibiotic last night but no signs of aspiration pneumonitis/PNA on imaging. Will treat with IV lasix 20mg  x2 to remove extra fluid and reassess for symptom resolution. -IV lasix 20mg  x2 -continue supplemental O2 prn -lovenox ppx -if worsening hypoxia, consider repeat CXR to check for aspiration pneumonitis/PNA >> if CXR normal and hypoxia worsening, consider CT PE (low concern for PE at this time)  Occult MRSA bacteremia In the setting of MRSA bacteriuria from last admission.  Blood culture 6/19 was negative to date.  Lumbar MRI done this admission was negative for osteo or abscess.  Will obtain thoracic MRI, to obtain duration of antibiotics. -transitioned to zyvox per ID -She has a PICC line placed yesterday -one time dose of ativan prior to thoracic MRI  Iron deficiency anemia 1200 mg of iron deficient.  She has not  received blood during this admission.  We will start IV iron since patient is not bacteremic. 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  We started duloxetine yesterday.  We will continue to encourage patient daily.  Chaplain visit is also helping.  She will also need a lot of support from her family as well.  Diet: Regular. Cortrak in place. DVT: Lovenox IVF: N/A CODE STATUS full Cardiac monitor: N/A  Barriers to Discharge: Medical stability, pain control and wound care.  Signed: Merrilyn Puma, MD Internal Medicine Residency My pager: [phone]  After 5pm on weekdays and 1pm on weekends: On Call pager [phone]

## 2022-02-20 NOTE — Progress Notes (Signed)
Physical Therapy Treatment Patient Details Name: Paula Lucas MRN: [id number] DOB: September 11, 1990 Today's Date: 02/20/2022   History of Present Illness Pt is a 31 y/o female admitted 6/19 secondary to worsening abdominal pain. Pt left AMA on 6/18 from ICU for pelvic abscess and had drain placement during that admission. Pt reports she was [redacted] weeks pregnant at that time. Found to have tuboovarian abscess and pelvic abscess. Pt is s/p second drain placement and exchange and repositioning of first drain. Plan for cortrak 6/30. PMH includes STIs.    PT Comments    Patient declining OOB mobility this session due to reporting frequent trips to [facility] to attempt BM with no success. Patient agreeable to bed level exercises. Able to perform exercises focused on LE strengthening to assist with progressing mobility. D/c plan remains appropriate.     Recommendations for follow up therapy are one component of a multi-disciplinary discharge planning process, led by the attending physician.  Recommendations may be updated based on patient status, additional functional criteria and insurance authorization.  Follow Up Recommendations  Home health PT (pending progress)     Assistance Recommended at Discharge Intermittent Supervision/Assistance  Patient can return home with the following Help with stairs or ramp for entrance;Assist for transportation;Assistance with cooking/housework;A little help with bathing/dressing/bathroom;A little help with walking and/or transfers   Equipment Recommendations  Rolling Alina Gilkey (2 wheels);BSC/3in1    Recommendations for Other Services       Precautions / Restrictions Precautions Precautions: Fall Precaution Comments: tachy with mobility     Mobility  Bed Mobility               General bed mobility comments: declined mobility stating she has been back and forth to [facility] attempting to have bowel movement. Agreeable to bed level exercises    Transfers                         Ambulation/Gait                   Stairs             Wheelchair Mobility    Modified Rankin (Stroke Patients Only)       Balance                                            Cognition Arousal/Alertness: Awake/alert Behavior During Therapy: WFL for tasks assessed/performed Overall Cognitive Status: No family/caregiver present to determine baseline cognitive functioning                                 General Comments: pleasant throughout session. Following all commands appropriately. Eager to participate in exercise despite not wanting to mobilize        Exercises General Exercises - Lower Extremity Ankle Circles/Pumps: AROM, Both, 10 reps, Supine Heel Slides: AROM, Both, 10 reps, Supine Hip ABduction/ADduction: AROM, Both, 10 reps, Supine Straight Leg Raises: AROM, Both, 10 reps, Supine Heel Raises: AROM, Both, 10 reps, Supine (against foot board)    General Comments General comments (skin integrity, edema, etc.): on 9L O2 venti mask, VSS      Pertinent Vitals/Pain Pain Assessment Pain Assessment: Faces Faces Pain Scale: Hurts even more Pain Location: pelvic and drain placement region Pain Descriptors / Indicators: Grimacing, Moaning, Constant,  Operative site guarding Pain Intervention(s): Monitored during session, Repositioned    Home Living                          Prior Function            PT Goals (current goals can now be found in the care plan section) Acute Rehab PT Goals PT Goal Formulation: With patient Time For Goal Achievement: 02/27/22 Potential to Achieve Goals: Good Progress towards PT goals: Progressing toward goals    Frequency    Min 3X/week      PT Plan Current plan remains appropriate    Co-evaluation              AM-PAC PT "6 Clicks" Mobility   Outcome Measure  Help needed turning from your back to your side while in a flat bed without  using bedrails?: A Little Help needed moving from lying on your back to sitting on the side of a flat bed without using bedrails?: A Little Help needed moving to and from a bed to a chair (including a wheelchair)?: A Little Help needed standing up from a chair using your arms (e.g., wheelchair or bedside chair)?: A Little Help needed to walk in hospital room?: Total Help needed climbing 3-5 steps with a railing? : Total 6 Click Score: 14    End of Session Equipment Utilized During Treatment: Oxygen Activity Tolerance: Patient tolerated treatment well Patient left: in bed;with call bell/phone within reach;with bed alarm set Nurse Communication: Mobility status PT Visit Diagnosis: Other abnormalities of gait and mobility (R26.89);Pain;Muscle weakness (generalized) (M62.81);Difficulty in walking, not elsewhere classified (R26.2) Pain - Right/Left: Left     Time: 6761-9509 PT Time Calculation (min) (ACUTE ONLY): 17 min  Charges:  $Therapeutic Exercise: 8-22 mins                     Mekiah Wahler A. Dan Humphreys PT, DPT Acute Rehabilitation Services Office [phone]    Viviann Spare 02/20/2022, 4:37 PM

## 2022-02-20 NOTE — Progress Notes (Signed)
Patient ID: Paula Lucas, female   DOB: 08-16-1991, 31 y.o.   MRN: [id number]   Assessment/Plan: Principal Problem:   Pelvic abscess  Active Problems:   Sepsis ([place])   MRSA (methicillin resistant staph aureus) culture positive   Hepatic abscess   Hx of Fitz-Hugh-Curtis syndrome   Protein-calorie malnutrition (HCC)  Pelvic abscess/ Hepatic abscess Patient has multiple abscesses in her abdomen and pelvis related to prior PID with current GC and Chlamydia negative. Patient now s/p ex lap with pelvic and abdominal wash out, POD #4. Has drains in place that are draining serosanguineous material only (approximately 25 cc yesterday). Cultures from surgery are sterile. Her WBC is coming down and is in the normal range.  She is afebrile.  Reports slight improvement in her abdominal pain.   +ve Flatus   Continue Vancomycin, Rocephin, Doxycycline, Flagyl--appreciate ID input   Pain medications as needed   W-->D dressing changes ordered--declined wound vac-and her wound was fresh we will have wound reassessed for potential NPWT placement early next week.   Increase ambulation     If further surgery is planned, would need GYN ONC consult.   Severe protein calorie malnutrition Prealbumin was 7.9 she is now status post core track placement with tube feeds ongoing.  She does note some nausea associated with this.  Would strongly consider advancing her diet.  Sepsis ([place]) is not currently actively septic .   MRSA (methicillin resistant staph aureus) culture positive On vancomycin   Hypokalemia Replete and trend   Iron deficiency anemia No active bleeding Could consider IV iron (Venofer) prior to discharge.  Appears she is getting this today.  New onset shortness of breath X-ray findings consistent with fluid overload.  Her I's and O's for yesterday suggest she is up 6 L. Could consider echo Would also strongly advise some Lasix.  Subjective: Interval History: Patient developed  shortness of breath overnight and is now on Venturi mask.  Chest x-ray shows pulmonary congestion and bilateral pleural effusions consistent with fluid overload.  Objective: Vital signs in last 24 hours: Temp:  [97.8 F (36.6 C)-99.3 F (37.4 C)] 98.9 F (37.2 C) (07/01 0600) Pulse Rate:  [100-114] 100 (07/01 0600) Resp:  [20-21] 20 (07/01 0600) BP: (113-132)/(82-99) 129/98 (07/01 0600) SpO2:  [90 %-93 %] 92 % (07/01 0600)  Intake/Output from previous day: 06/30 0701 - 07/01 0700 In: 10 [I.V.:10] Out: 675 [Urine:650; Drains:25] Intake/Output this shift: No intake/output data recorded.  Physical exam General appearance: alert, cooperative, and appears stated age Head: Normocephalic, without obvious abnormality, atraumatic Lungs: Normal effort Heart: mildly tachy rate, regular rhythm Abdomen: less distended, diffusely tender  Incision: Dressing is dry and intact.  2 JP drains present with serosanguineous fluid. Extremities: Homans sign is negative, no sign of DVT Skin: Skin color, texture, turgor normal. No rashes or lesions Neurologic: Grossly normal    Results for orders placed or performed during the hospital encounter of 02/08/22 (from the past 24 hour(s))  Prealbumin     Status: Abnormal   Collection Time: 02/19/22 11:23 AM  Result Value Ref Range   Prealbumin 7.9 (L) 18 - 38 mg/dL  Glucose, capillary     Status: None   Collection Time: 02/19/22  8:01 PM  Result Value Ref Range   Glucose-Capillary 98 70 - 99 mg/dL  CBC     Status: Abnormal   Collection Time: 02/20/22  4:20 AM  Result Value Ref Range   WBC 9.9 4.0 - 10.5 K/uL   RBC  2.84 (L) 3.87 - 5.11 MIL/uL   Hemoglobin 8.0 (L) 12.0 - 15.0 g/dL   HCT 25.3 (L) 36.0 - 46.0 %   MCV 89.1 80.0 - 100.0 fL   MCH 28.2 26.0 - 34.0 pg   MCHC 31.6 30.0 - 36.0 g/dL   RDW 17.4 (H) 11.5 - 15.5 %   Platelets 583 (H) 150 - 400 K/uL   nRBC 0.0 0.0 - 0.2 %  Basic metabolic panel     Status: Abnormal   Collection Time:  02/20/22  4:20 AM  Result Value Ref Range   Sodium 136 135 - 145 mmol/L   Potassium 3.4 (L) 3.5 - 5.1 mmol/L   Chloride 102 98 - 111 mmol/L   CO2 30 22 - 32 mmol/L   Glucose, Bld 99 70 - 99 mg/dL   BUN 6 6 - 20 mg/dL   Creatinine, Ser 0.72 0.44 - 1.00 mg/dL   Calcium 8.2 (L) 8.9 - 10.3 mg/dL   GFR, Estimated >60 >60 mL/min   Anion gap 4 (L) 5 - 15  Magnesium     Status: None   Collection Time: 02/20/22  4:20 AM  Result Value Ref Range   Magnesium 1.7 1.7 - 2.4 mg/dL  Phosphorus     Status: None   Collection Time: 02/20/22  4:20 AM  Result Value Ref Range   Phosphorus 3.9 2.5 - 4.6 mg/dL    Studies/Results: DG CHEST PORT 1 VIEW  Result Date: 02/20/2022 CLINICAL DATA:  Shortness of breath EXAM: PORTABLE CHEST - 1 VIEW COMPARISON:  02/18/2022 FINDINGS: Unchanged mild cardiomegaly and mild pulmonary vascular congestion. Bibasilar opacities likely combination of small pleural effusions with adjacent atelectasis. Feeding tube terminates in the left upper quadrant. Right upper extremity PICC terminates at the cavoatrial junction. IMPRESSION: 1. Mild cardiomegaly. 2. Mild pulmonary vascular congestion. 3. Small bilateral pleural effusions with adjacent atelectasis. Electronically Signed   By: Miachel Roux M.D.   On: 02/20/2022 09:30   DG Abd Portable 1V  Result Date: 02/19/2022 CLINICAL DATA:  Provided history: Encounter for feeding tube placement. EXAM: PORTABLE ABDOMEN - 1 VIEW COMPARISON:  Abdominal radiograph 02/18/2022. FINDINGS: An enteric tube passes below the level left hemidiaphragm with tip projecting in the expected location of the pylorus or first portion of the duodenum. Nonobstructive bowel gas pattern within the relies abdomen. No acute bony abnormality identified. IMPRESSION: Enteric tube present with tip projecting in the expected location of the pylorus or first portion of the duodenum. Nonspecific, nonobstructive bowel gas pattern within the visualized abdomen. Electronically  Signed   By: Kellie Simmering D.O.   On: 02/19/2022 14:20   DG CHEST PORT 1 VIEW  Result Date: 02/18/2022 CLINICAL DATA:  Central line placement EXAM: PORTABLE CHEST 1 VIEW COMPARISON:  Radiograph 09/06/2021 FINDINGS: Right upper extremity PICC tip crosses the midline, across the brachiocephalic confluence, tip overlies the left brachiocephalic vein. Borderline enlarged cardiac silhouette. Bibasilar consolidations, left greater than right. Small bilateral pleural effusions. Low lung volumes. No pneumothorax. No acute osseous abnormality. IMPRESSION: Right upper extremity PICC tip courses across the brachiocephalic confluence, tip overlying the left brachiocephalic vein. Recommend repositioning. Bilateral pleural effusions with adjacent bibasilar atelectasis, left greater than right. Low lung volumes. These results will be called to the ordering clinician or representative by the Radiologist Assistant, and communication documented in the PACS or [employer]. Electronically Signed   By: Maurine Simmering M.D.   On: 02/18/2022 18:18   [place] EKG SITE RITE  Result Date: 02/18/2022 If  Site Rite image not attached, placement could not be confirmed due to current cardiac rhythm.  DG Abd Portable 1V  Result Date: 02/18/2022 CLINICAL DATA:  Abdominal distension. EXAM: PORTABLE ABDOMEN - 1 VIEW COMPARISON:  Abdominal CT February 14, 2022 FINDINGS: Drainage catheters again seen in the lower abdomen/pelvis. No significant change in the appearance of the bowel pattern. No evidence of small-bowel obstruction. IMPRESSION: Stable appearance of the bowel pattern. Electronically Signed   By: Fidela Salisbury M.D.   On: 02/18/2022 12:52   CT ABDOMEN PELVIS W CONTRAST  Result Date: 02/14/2022 CLINICAL DATA:  Follow-up intra-abdominal abscesses. EXAM: CT ABDOMEN AND PELVIS WITH CONTRAST TECHNIQUE: Multidetector CT imaging of the abdomen and pelvis was performed using the standard protocol following bolus administration of  intravenous contrast. RADIATION DOSE REDUCTION: This exam was performed according to the departmental dose-optimization program which includes automated exposure control, adjustment of the mA and/or kV according to patient size and/or use of iterative reconstruction technique. CONTRAST:  176mL OMNIPAQUE IOHEXOL 300 MG/ML  SOLN COMPARISON:  CT scan 02/08/2022 FINDINGS: Lower chest: Persistent moderate-sized pleural effusions with overlying atelectasis. Hepatobiliary: Small residual lentiform rim enhancing abscess at the dome of the liver anteriorly measuring a maximum of 5.6 x 1.1 cm. This previously measured approximately 9.3 x 1.1 cm. Inferiorly there is a persistent perihepatic abscess. It measures approximately 9.5 x 3.1 cm and previously measured 10.1 x 3.7 cm. No worrisome hepatic lesions or intrahepatic biliary dilatation. The gallbladder is unremarkable. No common bile duct dilatation. Pancreas: No mass, inflammation or ductal dilatation. Spleen: Normal size.  No focal lesions. Adrenals/Urinary Tract: Adrenal glands and kidneys are unremarkable and stable. The bladder is grossly normal. Stomach/Bowel: The stomach, duodenum, small bowel and colon grossly normal. Vascular/Lymphatic: The aorta and branch vessels are patent. The major venous structures are patent. Stable borderline enlarged mesenteric and retroperitoneal lymph nodes, likely reactive/inflammatory. Reproductive: The uterus and ovaries are stable. Other: Percutaneous drainage catheter in the upper right pelvis with a persistent rim enhancing abscess measuring approximately 8.8 x 3.5 cm and previously measuring 9.8 x 5.6 cm. A second drainage catheter in the lower right cul-de-sac area with a small persistent rim enhancing abscess measuring 4.1 x 2.0 cm. This previously measured 5.2 x 4.9 cm. Left-sided rounded pelvic abscess measures 3.6 x 2.9 cm on image 71/3. This previously measured 3.5 x 2.7 cm. Persistent diffuse mesenteric and subcutaneous  inflammations/edema. Musculoskeletal: No significant bony findings. IMPRESSION: 1. Persistent moderate-sized pleural effusions with overlying atelectasis. 2. Persistent but slightly smaller undrained perihepatic abscesses. 3. Two right-sided pelvic drainage catheters with persistent but smaller surrounding abscesses. 4. Left-sided undrained pelvic abscess is slightly smaller also. 5. Persistent diffuse mesenteric and subcutaneous inflammation. 6. Stable borderline enlarged mesenteric and retroperitoneal lymph nodes, likely reactive/inflammatory. Electronically Signed   By: Marijo Sanes M.D.   On: 02/14/2022 14:46   MR Lumbar Spine W Wo Contrast  Result Date: 02/13/2022 CLINICAL DATA:  Initial evaluation for back pain, history of MRSA bacteremia. EXAM: MRI LUMBAR SPINE WITHOUT AND WITH CONTRAST TECHNIQUE: Multiplanar and multiecho pulse sequences of the lumbar spine were obtained without and with intravenous contrast. CONTRAST:  7.18mL GADAVIST GADOBUTROL 1 MMOL/ML IV SOLN COMPARISON:  Prior CT from 02/08/2022. FINDINGS: Segmentation: Standard. Lowest well-formed disc space labeled the L5-S1 level. Alignment: Mild levoscoliosis with straightening of the normal lumbar lordosis. No listhesis. Vertebrae: Vertebral body height maintained without acute or chronic fracture. Bone marrow signal intensity diffusely decreased on T1 weighted sequence, nonspecific, but most commonly related to  anemia, smoking or obesity. No discrete or worrisome osseous lesions. No evidence for osteomyelitis discitis or septic arthritis within the lumbar spine. Conus medullaris and cauda equina: Conus extends to the L1-2 level. Conus and cauda equina appear normal. Paraspinal and other soft tissues: Paraspinous soft tissues demonstrate no acute finding. Multi loculated collections/abscesses with surrounding enhancement partially seen within the partially visualized pelvis, better characterized on recent CT. Remainder of the visualized  visceral structures otherwise unremarkable. Disc levels: L1-2:  Unremarkable. L2-3: Small left subarticular disc protrusion mildly indents the left ventral thecal sac (series 12, image 16). Borderline mild narrowing of the left lateral recess. Central canal remains widely patent. No foraminal stenosis. L3-4:  Unremarkable. L4-5:  Unremarkable. L5-S1: Left subarticular disc protrusion extends into the left lateral recess, contacting the descending left S1 nerve root (series 12, image 34). Resultant mild left lateral recess stenosis. Central canal remains patent. No significant foraminal stenosis. IMPRESSION: 1. No MRI evidence for acute infection within the lumbar spine. 2. Left subarticular disc protrusion at L5-S1, contacting the descending left S1 nerve root in the left lateral recess. 3. Small left subarticular disc protrusion at L2-3 with borderline mild left lateral recess stenosis. 4. Multiloculated collections/abscesses within the partially visualized pelvis, better characterized on recent CT. Electronically Signed   By: Jeannine Boga M.D.   On: 02/13/2022 04:04   ECHOCARDIOGRAM COMPLETE  Result Date: 02/12/2022    ECHOCARDIOGRAM REPORT   Patient Name:   MALLARY MERIDA Date of Exam: 02/12/2022 Medical Rec #:  [id number]        Height:       63.0 in Accession #:    [id number]       Weight:       169.5 lb Date of Birth:  [birth year]        BSA:          1.802 m Patient Age:    31 years         BP:           113/87 mmHg Patient Gender: F                HR:           98 bpm. Exam Location:  Inpatient Procedure: 2D Echo, Cardiac Doppler and Color Doppler Indications:   Staph aureus infection  History:       Patient has no prior history of Echocardiogram examinations.                Sepsis.  Sonographer:   Clayton Lefort RDCS (AE) Referring      TRUNG T VU Phys: IMPRESSIONS  1. Left ventricular ejection fraction, by estimation, is 60 to 65%. The left ventricle has normal function. The left ventricle has no  regional wall motion abnormalities. Left ventricular diastolic parameters were normal.  2. Right ventricular systolic function is normal. The right ventricular size is normal.  3. The mitral valve is normal in structure. Trivial mitral valve regurgitation. No evidence of mitral stenosis.  4. The aortic valve is normal in structure. Aortic valve regurgitation is not visualized. No aortic stenosis is present. FINDINGS  Left Ventricle: Left ventricular ejection fraction, by estimation, is 60 to 65%. The left ventricle has normal function. The left ventricle has no regional wall motion abnormalities. The left ventricular internal cavity size was normal in size. There is  no left ventricular hypertrophy. Left ventricular diastolic parameters were normal. Right Ventricle: The right ventricular size is normal. Right vetricular wall  thickness was not well visualized. Right ventricular systolic function is normal. Left Atrium: Left atrial size was normal in size. Right Atrium: Right atrial size was normal in size. Pericardium: There is no evidence of pericardial effusion. Mitral Valve: The mitral valve is normal in structure. Trivial mitral valve regurgitation. No evidence of mitral valve stenosis. Tricuspid Valve: The tricuspid valve is normal in structure. Tricuspid valve regurgitation is trivial. Aortic Valve: The aortic valve is normal in structure. Aortic valve regurgitation is not visualized. No aortic stenosis is present. Aortic valve mean gradient measures 4.0 mmHg. Aortic valve peak gradient measures 7.1 mmHg. Aortic valve area, by VTI measures 2.25 cm. Pulmonic Valve: The pulmonic valve was normal in structure. Pulmonic valve regurgitation is not visualized. Aorta: The aortic root and ascending aorta are structurally normal, with no evidence of dilitation. IAS/Shunts: The atrial septum is grossly normal.  LEFT VENTRICLE PLAX 2D LVIDd:         4.80 cm LVIDs:         3.40 cm LV PW:         0.90 cm LV IVS:         1.10 cm LVOT diam:     1.90 cm LV SV:         49 LV SV Index:   27 LVOT Area:     2.84 cm  RIGHT VENTRICLE             IVC RV Basal diam:  2.30 cm     IVC diam: 1.50 cm RV S prime:     12.40 cm/s TAPSE (M-mode): 2.1 cm LEFT ATRIUM             Index        RIGHT ATRIUM          Index LA diam:        3.80 cm 2.11 cm/m   RA Area:     9.13 cm LA Vol (A2C):   26.4 ml 14.65 ml/m  RA Volume:   15.80 ml 8.77 ml/m LA Vol (A4C):   29.7 ml 16.48 ml/m LA Biplane Vol: 29.3 ml 16.26 ml/m  AORTIC VALVE AV Area (Vmax):    2.24 cm AV Area (Vmean):   2.21 cm AV Area (VTI):     2.25 cm AV Vmax:           133.00 cm/s AV Vmean:          87.900 cm/s AV VTI:            0.218 m AV Peak Grad:      7.1 mmHg AV Mean Grad:      4.0 mmHg LVOT Vmax:         105.00 cm/s LVOT Vmean:        68.600 cm/s LVOT VTI:          0.173 m LVOT/AV VTI ratio: 0.79  AORTA Ao Root diam: 2.90 cm Ao Asc diam:  2.70 cm  SHUNTS Systemic VTI:  0.17 m Systemic Diam: 1.90 cm Kristeen Miss MD Electronically signed by Kristeen Miss MD Signature Date/Time: 02/12/2022/4:39:15 PM    Final    VAS [place] LOWER EXTREMITY VENOUS (DVT)  Result Date: 02/12/2022  Lower Venous DVT Study Patient Name:  MYLANI GENTRY Keelin  Date of Exam:   02/12/2022 Medical Rec #: [id number]         Accession #:    [id number] Date of Birth: 09-24-1990         Patient Gender:  F Patient Age:   38 years Exam Location:  [facility] Procedure:      VAS [place] LOWER EXTREMITY VENOUS (DVT) Referring Phys: JULIE MACHEN --------------------------------------------------------------------------------  Indications: Pain.  Risk Factors: None identified. Limitations: Poor patient cooperation, patient pain tolerance. Comparison Study: No prior studies. Performing Technologist: Oliver Hum RVT  Examination Guidelines: A complete evaluation includes B-mode imaging, spectral Doppler, color Doppler, and power Doppler as needed of all accessible portions of each vessel. Bilateral testing is considered an  integral part of a complete examination. Limited examinations for reoccurring indications may be performed as noted. The reflux portion of the exam is performed with the patient in reverse Trendelenburg.  +---------+---------------+---------+-----------+----------+--------------+ RIGHT    CompressibilityPhasicitySpontaneityPropertiesThrombus Aging +---------+---------------+---------+-----------+----------+--------------+ CFV      Full           Yes      Yes                                 +---------+---------------+---------+-----------+----------+--------------+ SFJ      Full                                                        +---------+---------------+---------+-----------+----------+--------------+ FV Prox  Full                                                        +---------+---------------+---------+-----------+----------+--------------+ FV Mid   Full                                                        +---------+---------------+---------+-----------+----------+--------------+ FV DistalFull                                                        +---------+---------------+---------+-----------+----------+--------------+ PFV      Full                                                        +---------+---------------+---------+-----------+----------+--------------+ POP      Full           Yes      Yes                                 +---------+---------------+---------+-----------+----------+--------------+ PTV      Full                                                        +---------+---------------+---------+-----------+----------+--------------+  PERO     Full                                                        +---------+---------------+---------+-----------+----------+--------------+   +----+---------------+---------+-----------+----------+--------------+ LEFTCompressibilityPhasicitySpontaneityPropertiesThrombus Aging  +----+---------------+---------+-----------+----------+--------------+ CFV Full           Yes      Yes                                 +----+---------------+---------+-----------+----------+--------------+    Summary: RIGHT: - There is no evidence of deep vein thrombosis in the lower extremity.  - No cystic structure found in the popliteal fossa.  LEFT: - No evidence of common femoral vein obstruction.  *See table(s) above for measurements and observations. Electronically signed by Monica Martinez MD on 02/12/2022 at 11:23:51 AM.    Final    CT IMAGE GUIDED DRAINAGE BY PERCUTANEOUS CATHETER  Result Date: 02/10/2022 INDICATION: History of tubo-ovarian abscess, post CT-guided placement right lower abdominal/pelvic drainage catheter on 02/05/2022. Postprocedural CT scan performed 02/08/2022 demonstrates an unchanged fluid collection within the right lower pelvis and as such request made for placement of an additional percutaneous drainage catheter for infection source control purposes. Additionally, there is request for CT-guided exchange and/or repositioning of the pre-existing drainage catheter into the more dominant component of the residual pelvic abscess EXAM: 1. CT GUIDED RIGHT TRANS GLUTEAL APPROACH TUBO-OVARIAN ABSCESS DRAINAGE CATHETER PLACEMENT 2. CT-GUIDED EXCHANGE AND REPOSITIONING OF EXISTING RIGHT LOWER ABDOMINAL/PELVIC DRAINAGE CATHETER COMPARISON:  CT abdomen pelvis-02/08/2022; 02/04/2022 CT guided right lower abdominal/pelvic abscess drainage catheter placement-02/05/2022 MEDICATIONS: The patient is currently admitted to the hospital and receiving intravenous antibiotics. The antibiotics were administered within an appropriate time frame prior to the initiation of the procedure. ANESTHESIA/SEDATION: Moderate (conscious) sedation was employed during this procedure as administered by the Interventional Radiology RN. A total of Versed 4 mg, Dilaudid 1 mg and Fentanyl 150 mcg was administered  intravenously. Moderate Sedation Time: 60 minutes. The patient's level of consciousness and vital signs were monitored continuously by radiology nursing throughout the procedure under my direct supervision. CONTRAST:  None COMPLICATIONS: None immediate. PROCEDURE: RADIATION DOSE REDUCTION: This exam was performed according to the departmental dose-optimization program which includes automated exposure control, adjustment of the mA and/or kV according to patient size and/or use of iterative reconstruction technique. Informed written consent was obtained from the patient after a discussion of the risks, benefits and alternatives to treatment. The patient was initially placed prone on the CT gantry and a pre procedural CT was performed re-demonstrating the known abscess/fluid collection within the right hemipelvis with dominant component measuring approximately 5.5 x 5.4 cm (image 11, series 2). The procedure was planned. A timeout was performed prior to the initiation of the procedure. The skin overlying the right buttocks was prepped and draped in the usual sterile fashion. The overlying soft tissues were anesthetized with 1% lidocaine with epinephrine. Appropriate trajectory was planned with the use of a 22 gauge spinal needle. An 18 gauge trocar needle was advanced into the abscess/fluid collection and a short Amplatz super stiff wire was coiled within the collection. Appropriate positioning was confirmed with a limited CT scan. The tract was serially dilated allowing placement of a 10 [place] all-purpose drainage catheter. Appropriate positioning was confirmed with  a limited postprocedural CT scan. Approximately 65 ml of purulent appearing serous fluid was aspirated. The tube was connected to a drainage bag and secured in place within interrupted suture and a Stat Lock device. A dressing was applied. The patient was then repositioned supine on the CT gantry and noncontrast images were obtained of the lower pelvis  and pre-existing right anterior approach lower abdominal/pelvic drainage catheter. Attempts were made to retract the existing drainage catheter into the dominant component of the residual collection however this proved uncomfortable for the patient. As such, the external portion of the drainage catheter was prepped and draped in the usual sterile fashion. The external portion of the drainage catheter was cut and cannulated with a short Amplatz wire. Existing 12 French drainage catheter was then exchanged for a new 12 French percutaneous catheter which under intermittent CT guidance was repositioned into the dominant residual component of the right lower quadrant collection (series 11). The drainage catheter was connected to a gravity bag and secured in place within interrupted suture and a Stat Lock device. Patient tolerated the above procedures well (though did require a generous amount of conscious sedation medications) without immediate postprocedural complication. IMPRESSION: 1. Successful CT guided placement of a 10 [place] all purpose drain catheter into the undrained pelvic abscess via right trans gluteal approach with aspiration of 65 mL of purulent fluid. Samples were sent to the laboratory as requested by the ordering clinical team. 2. Successful CT guided exchange and repositioning of pre-existing 12 French right anterior abdominal/pelvic drainage catheter with end now coiled and locked within the residual anteriorly located abscess cavity. Electronically Signed   By: Sandi Mariscal M.D.   On: 02/10/2022 16:33   CT ABSCESS CATH EXCHANGE  Result Date: 02/10/2022 INDICATION: History of tubo-ovarian abscess, post CT-guided placement right lower abdominal/pelvic drainage catheter on 02/05/2022. Postprocedural CT scan performed 02/08/2022 demonstrates an unchanged fluid collection within the right lower pelvis and as such request made for placement of an additional percutaneous drainage catheter for infection  source control purposes. Additionally, there is request for CT-guided exchange and/or repositioning of the pre-existing drainage catheter into the more dominant component of the residual pelvic abscess EXAM: 1. CT GUIDED RIGHT TRANS GLUTEAL APPROACH TUBO-OVARIAN ABSCESS DRAINAGE CATHETER PLACEMENT 2. CT-GUIDED EXCHANGE AND REPOSITIONING OF EXISTING RIGHT LOWER ABDOMINAL/PELVIC DRAINAGE CATHETER COMPARISON:  CT abdomen pelvis-02/08/2022; 02/04/2022 CT guided right lower abdominal/pelvic abscess drainage catheter placement-02/05/2022 MEDICATIONS: The patient is currently admitted to the hospital and receiving intravenous antibiotics. The antibiotics were administered within an appropriate time frame prior to the initiation of the procedure. ANESTHESIA/SEDATION: Moderate (conscious) sedation was employed during this procedure as administered by the Interventional Radiology RN. A total of Versed 4 mg, Dilaudid 1 mg and Fentanyl 150 mcg was administered intravenously. Moderate Sedation Time: 60 minutes. The patient's level of consciousness and vital signs were monitored continuously by radiology nursing throughout the procedure under my direct supervision. CONTRAST:  None COMPLICATIONS: None immediate. PROCEDURE: RADIATION DOSE REDUCTION: This exam was performed according to the departmental dose-optimization program which includes automated exposure control, adjustment of the mA and/or kV according to patient size and/or use of iterative reconstruction technique. Informed written consent was obtained from the patient after a discussion of the risks, benefits and alternatives to treatment. The patient was initially placed prone on the CT gantry and a pre procedural CT was performed re-demonstrating the known abscess/fluid collection within the right hemipelvis with dominant component measuring approximately 5.5 x 5.4 cm (image 11, series 2). The  procedure was planned. A timeout was performed prior to the initiation of  the procedure. The skin overlying the right buttocks was prepped and draped in the usual sterile fashion. The overlying soft tissues were anesthetized with 1% lidocaine with epinephrine. Appropriate trajectory was planned with the use of a 22 gauge spinal needle. An 18 gauge trocar needle was advanced into the abscess/fluid collection and a short Amplatz super stiff wire was coiled within the collection. Appropriate positioning was confirmed with a limited CT scan. The tract was serially dilated allowing placement of a 10 [place] all-purpose drainage catheter. Appropriate positioning was confirmed with a limited postprocedural CT scan. Approximately 65 ml of purulent appearing serous fluid was aspirated. The tube was connected to a drainage bag and secured in place within interrupted suture and a Stat Lock device. A dressing was applied. The patient was then repositioned supine on the CT gantry and noncontrast images were obtained of the lower pelvis and pre-existing right anterior approach lower abdominal/pelvic drainage catheter. Attempts were made to retract the existing drainage catheter into the dominant component of the residual collection however this proved uncomfortable for the patient. As such, the external portion of the drainage catheter was prepped and draped in the usual sterile fashion. The external portion of the drainage catheter was cut and cannulated with a short Amplatz wire. Existing 12 French drainage catheter was then exchanged for a new 12 French percutaneous catheter which under intermittent CT guidance was repositioned into the dominant residual component of the right lower quadrant collection (series 11). The drainage catheter was connected to a gravity bag and secured in place within interrupted suture and a Stat Lock device. Patient tolerated the above procedures well (though did require a generous amount of conscious sedation medications) without immediate postprocedural complication.  IMPRESSION: 1. Successful CT guided placement of a 10 [place] all purpose drain catheter into the undrained pelvic abscess via right trans gluteal approach with aspiration of 65 mL of purulent fluid. Samples were sent to the laboratory as requested by the ordering clinical team. 2. Successful CT guided exchange and repositioning of pre-existing 12 French right anterior abdominal/pelvic drainage catheter with end now coiled and locked within the residual anteriorly located abscess cavity. Electronically Signed   By: Sandi Mariscal M.D.   On: 02/10/2022 16:33   CT ABDOMEN PELVIS W CONTRAST  Result Date: 02/08/2022 CLINICAL DATA:  Sepsis EXAM: CT ABDOMEN AND PELVIS WITH CONTRAST TECHNIQUE: Multidetector CT imaging of the abdomen and pelvis was performed using the standard protocol following bolus administration of intravenous contrast. RADIATION DOSE REDUCTION: This exam was performed according to the departmental dose-optimization program which includes automated exposure control, adjustment of the mA and/or kV according to patient size and/or use of iterative reconstruction technique. CONTRAST:  111mL OMNIPAQUE IOHEXOL 300 MG/ML  SOLN COMPARISON:  Previous studies including the CT done on 02-11-2022 FINDINGS: Lower chest: Small bilateral pleural effusions are seen more so on the right side. There are linear patchy infiltrates in the lingula and both lower lobes suggesting atelectasis/pneumonia. Hepatobiliary: No focal abnormality is seen in the liver. There is no dilation of bile ducts. Gallbladder is unremarkable. Small ascites is noted in the perihepatic region. Pancreas: No focal abnormality is seen. Spleen: Unremarkable. Adrenals/Urinary Tract: Adrenals are not enlarged. There is no hydronephrosis. There are no renal or ureteral stones. Urinary bladder is unremarkable. There is 3 mm low-density in the midportion of left kidney which is too small to be characterized. Stomach/Bowel: Stomach is not distended. Small  bowel loops are not dilated. Appendix is not seen. There is no significant wall thickening in the colon. There is interval placement of percutaneous drainage catheter with its tip in the right lower abdomen. There is interval decrease in size of the complex fluid collection in the right lower quadrant. There is 4 cm loculated fluid collection slightly to the left of midline at the level of pelvic inlet. There is 7.2 x 5.3 x 4.9 cm loculated fluid collection with minimally enhancing margin in the cul-de-sac in the pelvis. There are no pockets of air within these loculated fluid collections. There is stranding in the fat planes in the lower abdomen and pelvis. Vascular/Lymphatic: Vascular structures are unremarkable. Reproductive: Uterus is to the right of midline. Other: There is no pneumoperitoneum. Musculoskeletal: No acute findings are seen. IMPRESSION: Interval placement of percutaneous drainage catheter within air-fluid collection in the right lower abdomen with interval decrease in size. There is 7.2 cm loculated fluid collection in the cul-de-sac in the pelvis with slightly enhancing margins. There is another 4 cm loculated fluid collection in the left side of the pelvis. These may suggest loculated serous fluid collections or abscesses. Small ascites is noted in the perihepatic region. There is no evidence of intestinal obstruction or pneumoperitoneum. There is no hydronephrosis. Small bilateral pleural effusions. There are linear patchy infiltrates in both lower lung fields suggesting atelectasis/pneumonia. Electronically Signed   By: Ernie Avena M.D.   On: 02/08/2022 09:41   CT IMAGE GUIDED DRAINAGE BY PERCUTANEOUS CATHETER  Result Date: 02/05/2022 INDICATION: PID with large abdominopelvic fluid collection. EXAM: CT GUIDED DRAINAGE CATHETER PLACEMENT INTO ABDOMINOPELVIC FLUID COLLECTION RADIATION DOSE REDUCTION: This exam was performed according to the departmental dose-optimization program  which includes automated exposure control, adjustment of the mA and/or kV according to patient size and/or use of iterative reconstruction technique. COMPARISON:  CT AP, 02/04/2022 MEDICATIONS: The patient is currently admitted to the hospital and receiving intravenous antibiotics. The antibiotics were administered within an appropriate time frame prior to the initiation of the procedure. ANESTHESIA/SEDATION: Moderate (conscious) sedation was employed during this procedure. A total of Versed 3 mg and Fentanyl 75 mcg was administered intravenously. Additionally, 1 mg Dilaudid was administered IV. Moderate Sedation Time: 19 minutes. The patient's level of consciousness and vital signs were monitored continuously by radiology nursing throughout the procedure under my direct supervision. CONTRAST:  None COMPLICATIONS: None immediate. PROCEDURE: Informed written consent was obtained from the patient and/or patient's representative after a discussion of the risks, benefits and alternatives to treatment. The patient was placed supine on the CT gantry and a pre procedural CT was performed re-demonstrating the known abscess/fluid collection within the lower abdomen/pelvis. The procedure was planned. A timeout was performed prior to the initiation of the procedure. The RIGHT lower quadrant was prepped and draped in the usual sterile fashion. The overlying soft tissues were anesthetized with 1% lidocaine with epinephrine. Appropriate trajectory was planned with the use of a 22 gauge spinal needle. An 18 gauge trocar needle was advanced into the abscess/fluid collection and a short Amplatz super stiff wire was coiled within the collection. Appropriate positioning was confirmed with a limited CT scan. The tract was serially dilated allowing placement of a 12 [place] all-purpose drainage catheter. Appropriate positioning was confirmed with a limited postprocedural CT scan. 5 mL ml of serous fluid was aspirated and submitted for  analysis. The tube was connected to a drainage bag and sutured in place. A dressing was placed. The patient tolerated the procedure  well without immediate post procedural complication. IMPRESSION: Successful CT guided placement of a 12 Fr drainage catheter into the abdominopelvic collection with aspiration of serous fluid, as above. Samples were sent to the laboratory as requested by the ordering clinical team. Michaelle Birks, MD [facility] Radiology Electronically Signed   By: Michaelle Birks M.D.   On: 02/05/2022 18:08   CT ABDOMEN PELVIS WO CONTRAST  Result Date: 02/04/2022 CLINICAL DATA:  Abdominal pain, acute, nonlocalized septic shock, abnormal uterine US, concern for PID EXAM: CT ABDOMEN AND PELVIS WITHOUT CONTRAST TECHNIQUE: Multidetector CT imaging of the abdomen and pelvis was performed following the standard protocol without IV contrast. RADIATION DOSE REDUCTION: This exam was performed according to the departmental dose-optimization program which includes automated exposure control, adjustment of the mA and/or kV according to patient size and/or use of iterative reconstruction technique. COMPARISON:  Pelvic ultrasound today FINDINGS: Lower chest: Pleural thickening noted peripherally in the right lower hemithorax, possibly representing trace loculated pleural effusion. No confluent airspace opacities. Hepatobiliary: No focal hepatic abnormality. Gallbladder unremarkable. Pancreas: No focal abnormality or ductal dilatation. Spleen: No focal abnormality.  Normal size. Adrenals/Urinary Tract: No adrenal abnormality. No focal renal abnormality. No stones or hydronephrosis. Urinary bladder is unremarkable. Stomach/Bowel: Wall thickening noted in the cecum which is immediately adjacent to the large pelvic fluid collection described below. This could reflect colitis or be secondarily inflamed. Stomach and small bowel grossly unremarkable. Vascular/Lymphatic: No  evidence of aneurysm or adenopathy. Reproductive: Large complex cystic area or fluid collection seen in the pelvis extending from the cul-de-sac superiorly into the right lower quadrant adjacent to the cecum. This measures approximately 12 x 8 cm on image 64 of series 3. Internal areas of increased density. This could reflect blood or abscess/PID. Uterus grossly unremarkable on this noncontrast study. Ovaries not definitively visualized. Other: There is free fluid adjacent to the liver and spleen. Musculoskeletal: No acute bony abnormality. IMPRESSION: Complex process in the pelvis including what appears to be a large complex fluid collection measuring 12 x 8 cm extending from the cul-de-sac superiorly to the cecum which could reflect blood products or abscess. This also could conceivably be related to and ovary. Neither ovary definitively identified. Wall thickening in the cecum could reflect colitis or secondary inflammation related to the pelvic process. Perihepatic and perisplenic ascites. Possible trace loculated right pleural effusion peripherally in the right lower hemithorax. Electronically Signed   By: Rolm Baptise M.D.   On: 02/04/2022 22:29   US PELVIC COMPLETE WITH TRANSVAGINAL  Addendum Date: 02/04/2022   ADDENDUM REPORT: 02/04/2022 13:44 ADDENDUM: In light of negative quantitative beta HCG and no documented pregnancy the possibility of infectious causes for the above findings may be more likely but findings are essentially quite nonspecific at this time. Even noncontrast CT could prove beneficial to allow for assessment of pelvic fluid to determine whether this represents frank hemoperitoneum and to assess for any associated inflammatory changes that may be present not well assessed on ultrasound. These results were called by telephone at the time of interpretation on 02/04/2022 at 1:44 pm to provider [facility] , who verbally acknowledged these results. Electronically Signed   By: Zetta Bills M.D.   On: 02/04/2022 13:44   Result Date: 02/04/2022 CLINICAL DATA:  A 31 year old female presents with pelvic pain and bleeding. Patient reports positive pregnancy but urine pregnancy currently negative by report and beta HCG is currently pending. EXAM: TRANSABDOMINAL AND TRANSVAGINAL ULTRASOUND OF PELVIS TECHNIQUE: Both  transabdominal and transvaginal ultrasound examinations of the pelvis were performed. Transabdominal technique was performed for global imaging of the pelvis including uterus, ovaries, adnexal regions, and pelvic cul-de-sac. It was necessary to proceed with endovaginal exam following the transabdominal exam to visualize the endometrium and adnexa. COMPARISON:  No recent imaging is available for comparison. FINDINGS: Uterus Measurements: 12.6 x 8.0 x 8.5 (volume = 450 cc) cm. = volume: 450 mL. Heterogeneous uterus with thickened endometrium. Cystic area in the endometrial canal that measures 4.5 x 2.6 x 4.4 cm. This cystic characteristics and central increased echogenicity. Central increased echogenicity the without discrete finding that would suggest embryo with very limited assessment due to patient discomfort and clinical condition. Endometrium Thickness: 3.3 cm. Thickened with cystic area in the fundus of the uterus. Right ovary Measurements: 3.2 x 2.7 x 2.1 (volume = 9.5 cc) cm = volume: 9.5 mL. Normal appearance/no adnexal mass. Left ovary Not visible, in the area of the LEFT adnexa is heterogeneous material that has the appearance of blood products but conforms to the cul-de-sac and LEFT adnexa. Other findings Moderate heterogeneous fluid in the LEFT adnexa primarily but also tracking along the RIGHT uterus. IMPRESSION: Unusual cystic area in the endometrial canal. If this patient has any history of recent positive pregnancy test this could represent a failed gestation with retained products of conception. Pseudo gestational sac with areas of internal hemorrhage in the setting of  ruptured ectopic pregnancy is also considered. Heterogeneous appearance of material in the cul-de-sac could also potentially represent sequela of pyogenic infection though this is lower on the differential at this time based on appearance. If the patient could tolerate further evaluation with cross-sectional imaging CT could potentially be helpful. Ultimately gyn consultation is suggested. Critical Value/emergent results were called by telephone at the time of interpretation on 02/04/2022 at 11:01 am to provider [facility] , who verbally acknowledged these results. Electronically Signed: By: Zetta Bills M.D. On: 02/04/2022 11:01   DG Chest Port 1 View  Result Date: 02/04/2022 CLINICAL DATA:  31 year old female with history of lower abdominal pain and vaginal bleeding 3 days ago. Fever. Low blood pressure. EXAM: PORTABLE CHEST 1 VIEW COMPARISON:  Chest x-ray 08/09/2021. FINDINGS: Lung volumes are slightly low. No consolidative airspace disease. No pleural effusions. No pneumothorax. No evidence of pulmonary edema. Heart size is normal. The patient is rotated to the right on today's exam, resulting in distortion of the mediastinal contours and reduced diagnostic sensitivity and specificity for mediastinal pathology. IMPRESSION: 1. No radiographic evidence of acute cardiopulmonary disease. Electronically Signed   By: Vinnie Langton M.D.   On: 02/04/2022 10:55    Scheduled Meds:  Chlorhexidine Gluconate Cloth  6 each Topical Daily   docusate  100 mg Per Tube Daily   doxycycline  100 mg Oral Q12H   enoxaparin (LOVENOX) injection  40 mg Subcutaneous Q24H   feeding supplement  237 mL Oral BID BM   feeding supplement (PROSource TF)  45 mL Per Tube BID   gabapentin  300 mg Oral TID   ketorolac  15 mg Intravenous Q6H   lidocaine  1 patch Transdermal Daily   linezolid  600 mg Oral Q12H   metroNIDAZOLE  500 mg Oral Q12H   multivitamin with minerals  1 tablet Oral Daily   nicotine  21 mg Transdermal  Daily   oxyCODONE  20 mg Oral Q12H   polyethylene glycol  17 g Oral BID   potassium chloride  40 mEq Oral BID  sodium chloride flush  10-40 mL Intracatheter Q12H   traZODone  50 mg Oral QHS   Continuous Infusions:  cefTRIAXone (ROCEPHIN)  IV 2 g (02/19/22 2322)   feeding supplement (OSMOLITE 1.5 CAL) 1,000 mL (02/19/22 1810)   ferric gluconate (FERRLECIT) IVPB 250 mg (02/19/22 1431)   PRN Meds:bisacodyl, diphenhydrAMINE **OR** diphenhydrAMINE, HYDROmorphone (DILAUDID) injection, hydrOXYzine, lip balm, LORazepam, menthol-cetylpyridinium, ondansetron **OR** ondansetron (ZOFRAN) IV, phenol, polyvinyl alcohol, prochlorperazine, simethicone, sodium chloride flush    LOS: 11 days   Donnamae Jude, MD 02/20/2022 10:58 AM

## 2022-02-21 ENCOUNTER — Inpatient Hospital Stay (HOSPITAL_COMMUNITY): Payer: Medicaid Other

## 2022-02-21 DIAGNOSIS — E43 Unspecified severe protein-calorie malnutrition: Secondary | ICD-10-CM

## 2022-02-21 DIAGNOSIS — Z8719 Personal history of other diseases of the digestive system: Secondary | ICD-10-CM

## 2022-02-21 DIAGNOSIS — Z22322 Carrier or suspected carrier of Methicillin resistant Staphylococcus aureus: Secondary | ICD-10-CM

## 2022-02-21 LAB — BASIC METABOLIC PANEL
Anion gap: 7 (ref 5–15)
BUN: 7 mg/dL (ref 6–20)
CO2: 32 mmol/L (ref 22–32)
Calcium: 8.3 mg/dL — ABNORMAL LOW (ref 8.9–10.3)
Chloride: 100 mmol/L (ref 98–111)
Creatinine, Ser: 0.72 mg/dL (ref 0.44–1.00)
GFR, Estimated: >60 mL/min (ref >60)
Glucose, Bld: 108 mg/dL — ABNORMAL HIGH (ref 70–99)
Potassium: 3.3 mmol/L — ABNORMAL LOW (ref 3.5–5.1)
Sodium: 139 mmol/L (ref 135–145)

## 2022-02-21 LAB — CBC WITH DIFFERENTIAL/PLATELET
Abs Immature Granulocytes: 0 10*3/uL (ref 0.00–0.07)
Basophils Absolute: 0.1 10*3/uL (ref 0.0–0.1)
Basophils Relative: 1 %
Eosinophils Absolute: 0.1 10*3/uL (ref 0.0–0.5)
Eosinophils Relative: 1 %
HCT: 25.9 % — ABNORMAL LOW (ref 36.0–46.0)
Hemoglobin: 8.1 g/dL — ABNORMAL LOW (ref 12.0–15.0)
Lymphocytes Relative: 26 %
Lymphs Abs: 2.1 10*3/uL (ref 0.7–4.0)
MCH: 28.4 pg (ref 26.0–34.0)
MCHC: 31.3 g/dL (ref 30.0–36.0)
MCV: 90.9 fL (ref 80.0–100.0)
Monocytes Absolute: 0.6 10*3/uL (ref 0.1–1.0)
Monocytes Relative: 7 %
Neutro Abs: 5.3 10*3/uL (ref 1.7–7.7)
Neutrophils Relative %: 65 %
Platelets: 519 10*3/uL — ABNORMAL HIGH (ref 150–400)
RBC: 2.85 MIL/uL — ABNORMAL LOW (ref 3.87–5.11)
RDW: 17.7 % — ABNORMAL HIGH (ref 11.5–15.5)
WBC: 8.2 10*3/uL (ref 4.0–10.5)
nRBC: 0 /100 WBC
nRBC: 0.2 % (ref 0.0–0.2)

## 2022-02-21 LAB — AEROBIC/ANAEROBIC CULTURE W GRAM STAIN (SURGICAL/DEEP WOUND)
Culture: NO GROWTH
Culture: NO GROWTH
Gram Stain: NONE SEEN
Gram Stain: NONE SEEN

## 2022-02-21 LAB — PHOSPHORUS: Phosphorus: 4 mg/dL (ref 2.5–4.6)

## 2022-02-21 LAB — GLUCOSE, CAPILLARY
Glucose-Capillary: 112 mg/dL — ABNORMAL HIGH (ref 70–99)
Glucose-Capillary: 116 mg/dL — ABNORMAL HIGH (ref 70–99)
Glucose-Capillary: 118 mg/dL — ABNORMAL HIGH (ref 70–99)
Glucose-Capillary: 82 mg/dL (ref 70–99)
Glucose-Capillary: 87 mg/dL (ref 70–99)

## 2022-02-21 LAB — MAGNESIUM: Magnesium: 1.7 mg/dL (ref 1.7–2.4)

## 2022-02-21 MED ORDER — HYDROMORPHONE HCL 1 MG/ML IJ SOLN
1.0000 mg | Freq: Once | INTRAMUSCULAR | Status: AC
  Administered 2022-02-21: 1 mg via INTRAVENOUS
  Filled 2022-02-21: qty 1

## 2022-02-21 MED ORDER — FUROSEMIDE 10 MG/ML IJ SOLN
40.0000 mg | Freq: Once | INTRAMUSCULAR | Status: AC
  Administered 2022-02-21: 40 mg via INTRAVENOUS
  Filled 2022-02-21: qty 4

## 2022-02-21 MED ORDER — LINEZOLID 600 MG PO TABS
600.0000 mg | ORAL_TABLET | Freq: Two times a day (BID) | ORAL | Status: DC
  Administered 2022-02-22 – 2022-02-25 (×8): 600 mg
  Filled 2022-02-21 (×9): qty 1

## 2022-02-21 MED ORDER — METRONIDAZOLE 500 MG PO TABS
500.0000 mg | ORAL_TABLET | Freq: Two times a day (BID) | ORAL | Status: DC
  Administered 2022-02-22 – 2022-02-25 (×7): 500 mg
  Filled 2022-02-21 (×7): qty 1

## 2022-02-21 MED ORDER — MAGNESIUM SULFATE IN D5W 1-5 GM/100ML-% IV SOLN
1.0000 g | Freq: Once | INTRAVENOUS | Status: AC
  Administered 2022-02-21: 1 g via INTRAVENOUS
  Filled 2022-02-21: qty 100

## 2022-02-21 MED ORDER — POTASSIUM CHLORIDE 10 MEQ/100ML IV SOLN
10.0000 meq | INTRAVENOUS | Status: DC
  Administered 2022-02-21 (×2): 10 meq via INTRAVENOUS
  Filled 2022-02-21 (×2): qty 100

## 2022-02-21 MED ORDER — KETOROLAC TROMETHAMINE 15 MG/ML IJ SOLN
15.0000 mg | Freq: Once | INTRAMUSCULAR | Status: AC
  Administered 2022-02-21: 15 mg via INTRAVENOUS

## 2022-02-21 MED ORDER — GADOBUTROL 1 MMOL/ML IV SOLN
7.5000 mL | Freq: Once | INTRAVENOUS | Status: AC | PRN
  Administered 2022-02-21: 7.5 mL via INTRAVENOUS

## 2022-02-21 MED ORDER — POTASSIUM CHLORIDE 10 MEQ/100ML IV SOLN
10.0000 meq | INTRAVENOUS | Status: AC
  Administered 2022-02-21: 10 meq via INTRAVENOUS
  Filled 2022-02-21: qty 100

## 2022-02-21 MED ORDER — METHOCARBAMOL 1000 MG/10ML IJ SOLN
1000.0000 mg | Freq: Four times a day (QID) | INTRAVENOUS | Status: DC
  Administered 2022-02-21 – 2022-02-22 (×5): 1000 mg via INTRAVENOUS
  Filled 2022-02-21 (×2): qty 10
  Filled 2022-02-21 (×2): qty 1000
  Filled 2022-02-21 (×4): qty 10

## 2022-02-21 MED ORDER — HYDROMORPHONE HCL 1 MG/ML IJ SOLN
1.0000 mg | Freq: Four times a day (QID) | INTRAMUSCULAR | Status: DC | PRN
  Administered 2022-02-21 – 2022-02-22 (×4): 1 mg via INTRAVENOUS
  Filled 2022-02-21 (×4): qty 1

## 2022-02-21 MED ORDER — PHENOL 1.4 % MT LIQD: 1.0000 | OROMUCOSAL | Status: DC | PRN

## 2022-02-21 MED ORDER — KETOROLAC TROMETHAMINE 15 MG/ML IJ SOLN
15.0000 mg | Freq: Once | INTRAMUSCULAR | Status: AC
  Administered 2022-02-21: 15 mg via INTRAVENOUS
  Filled 2022-02-21: qty 1

## 2022-02-21 NOTE — Progress Notes (Addendum)
Patient ID: Paula Lucas, female   DOB: [birth year], 31 y.o.   MRN: [id number] 5 Days Post-Op    Subjective: Cc- sore throat which she attributes to supplemental o2. Says she doesn't want to swallow medications bc she chokes.  Feels her abd pain is overall improving but reports excruciating pain at night before bed and in the morning. Reports a BM yesterday - she was glad she had a BM but does report some abd discomfort with her stool. States she got OOB 4x yesterday and was able to see her family. States she is using IS. Expresses frustration around her pain medication because it keeps changing.  Objective: Vital signs in last 24 hours: Temp:  [97.8 F (36.6 C)-97.9 F (36.6 C)] 97.9 F (36.6 C) (07/02 0742) Pulse Rate:  [87-108] 91 (07/02 0742) Resp:  [17-31] 17 (07/02 0742) BP: (114-144)/(75-92) 114/75 (07/02 0742) SpO2:  [92 %-96 %] 94 % (07/02 0742) Weight:  [83.6 kg] 83.6 kg (07/02 0500) Last BM Date : 02/21/22  Intake/Output from previous day: 07/01 0701 - 07/02 0700 In: 1066 [NG/GT:741; IV Piggyback:300] Out: 1600 [Urine:1600] Intake/Output this shift: No intake/output data recorded. Alert, non-toxic appearing. Pulm non-labored respirations on room air - O2 sats 86-89 during my exam - I replaced the venturi mask (7L) with increast to 92% - poor seal around cortrak but pt refusing nasal cannula GI: soft, less tender overall ; midline wound dressind c/d/I, JPx2 with SS drainge No lower extremity edema   Lab Results: CBC  Recent Labs    02/20/22 0420 02/21/22 0413  WBC 9.9 8.2  HGB 8.0* 8.1*  HCT 25.3* 25.9*  PLT 583* 519*   BMET Recent Labs    02/20/22 0420 02/21/22 0413  NA 136 139  K 3.4* 3.3*  CL 102 100  CO2 30 32  GLUCOSE 99 108*  BUN 6 7  CREATININE 0.72 0.72  CALCIUM 8.2* 8.3*   PT/INR No results for input(s): "LABPROT", "INR" in the last 72 hours. ABG No results for input(s): "PHART", "HCO3" in the last 72 hours.  Invalid input(s): "PCO2",  "PO2"  Studies/Results: DG CHEST PORT 1 VIEW  Result Date: 02/20/2022 CLINICAL DATA:  Shortness of breath EXAM: PORTABLE CHEST - 1 VIEW COMPARISON:  02/18/2022 FINDINGS: Unchanged mild cardiomegaly and mild pulmonary vascular congestion. Bibasilar opacities likely combination of small pleural effusions with adjacent atelectasis. Feeding tube terminates in the left upper quadrant. Right upper extremity PICC terminates at the cavoatrial junction. IMPRESSION: 1. Mild cardiomegaly. 2. Mild pulmonary vascular congestion. 3. Small bilateral pleural effusions with adjacent atelectasis. Electronically Signed   By: Acquanetta Belling M.D.   On: 02/20/2022 09:30   DG Abd Portable 1V  Result Date: 02/19/2022 CLINICAL DATA:  Provided history: Encounter for feeding tube placement. EXAM: PORTABLE ABDOMEN - 1 VIEW COMPARISON:  Abdominal radiograph 02/18/2022. FINDINGS: An enteric tube passes below the level left hemidiaphragm with tip projecting in the expected location of the pylorus or first portion of the duodenum. Nonobstructive bowel gas pattern within the relies abdomen. No acute bony abnormality identified. IMPRESSION: Enteric tube present with tip projecting in the expected location of the pylorus or first portion of the duodenum. Nonspecific, nonobstructive bowel gas pattern within the visualized abdomen. Electronically Signed   By: Jackey Loge D.O.   On: 02/19/2022 14:20    Anti-infectives: Anti-infectives (From admission, onward)    Start     Dose/Rate Route Frequency Ordered Stop   02/19/22 2200  linezolid (ZYVOX) tablet 600 mg  600 mg Oral Every 12 hours 02/19/22 1505 02/27/22 0959   02/17/22 1130  fluconazole (DIFLUCAN) tablet 200 mg        200 mg Oral Daily 02/17/22 1032 02/17/22 1145   02/13/22 2200  vancomycin (VANCOCIN) IVPB 1000 mg/200 mL premix  Status:  Discontinued        1,000 mg 200 mL/hr over 60 Minutes Intravenous Every 12 hours 02/13/22 0817 02/19/22 1505   02/13/22 0900  vancomycin  (VANCOREADY) IVPB 1750 mg/350 mL        1,750 mg 175 mL/hr over 120 Minutes Intravenous  Once 02/13/22 0809 02/13/22 1258   02/12/22 1315  doxycycline (VIBRA-TABS) tablet 100 mg        100 mg Oral Every 12 hours 02/12/22 1220 03/02/22 2359   02/12/22 1000  doxycycline (VIBRA-TABS) tablet 100 mg  Status:  Discontinued        100 mg Oral Daily 02/12/22 0804 02/12/22 1219   02/11/22 1345  doxycycline (VIBRA-TABS) tablet 100 mg        100 mg Oral  Once 02/11/22 1255 02/11/22 1346   02/11/22 1345  metroNIDAZOLE (FLAGYL) tablet 500 mg        500 mg Oral Every 12 hours 02/11/22 1255 03/09/22 2359   02/09/22 2051  metroNIDAZOLE (FLAGYL) IVPB 500 mg  Status:  Discontinued        500 mg 100 mL/hr over 60 Minutes Intravenous Every 12 hours 02/09/22 1106 02/11/22 1255   02/09/22 1600  cefTRIAXone (ROCEPHIN) 2 g in sodium chloride 0.9 % 100 mL IVPB        2 g 200 mL/hr over 30 Minutes Intravenous Every 24 hours 02/09/22 1147 03/09/22 2359   02/08/22 1700  metroNIDAZOLE (FLAGYL) IVPB 500 mg  Status:  Discontinued        500 mg 100 mL/hr over 60 Minutes Intravenous Every 8 hours 02/08/22 1205 02/09/22 1106   02/08/22 1630  ceFEPIme (MAXIPIME) 2 g in sodium chloride 0.9 % 100 mL IVPB  Status:  Discontinued        2 g 200 mL/hr over 30 Minutes Intravenous Every 8 hours 02/08/22 0822 02/09/22 1147   02/08/22 0900  doxycycline (VIBRAMYCIN) 100 mg in sodium chloride 0.9 % 250 mL IVPB  Status:  Discontinued        100 mg 125 mL/hr over 120 Minutes Intravenous 2 times daily 02/08/22 0811 02/11/22 1255   02/08/22 0815  ceFEPIme (MAXIPIME) 2 g in sodium chloride 0.9 % 100 mL IVPB        2 g 200 mL/hr over 30 Minutes Intravenous  Once 02/08/22 0811 02/08/22 0855   02/08/22 0815  metroNIDAZOLE (FLAGYL) IVPB 500 mg        500 mg 100 mL/hr over 60 Minutes Intravenous  Once 02/08/22 0811 02/08/22 1147       Assessment/Plan: PID Multiple intraabdominal abscesses - S/P ex lap, abdominal washout with drainage  of abscesses, repair SB serosa X 3 in conjunction with Dr. Shawnie Pons 6/27.  - JPs are serosanguinous, afebrile, WBC has normalized, abd pain improving and bowel function returned. - ongoing nausea and poor appetite, pre-dating surgery. Cortrak placed and continuous TF started 6/30.  -  new O2 requirement w/ venturi mask in place. Diuresis for pum edema per primary team.  -  I do think NPWT would help midline wound granulate and heal more efficiently.  -  IS, encourage OOB/mobility.   - ID following for antibiotic guidance, appreciate their assistance  - added scheduled  IV robaxin for pain control. Current pain regimen as below --   Neurontin 300 mg TID  Toradol 15 mg q 6h  IV robaxin 1,000 mg q 6h  Lidoderm patch daily  Oxycontin 20 mg q 12h  Dilaudid 1 mg q 4h PRN for breakthrough.  The patient and I discussed the need for PO pain medication per tube if she does not want to swallow pills. She expressed understanding that the PO meds take longer to start working but lost longer. She understands that going home on IV dilaudid is not an option. She is agreeable to RN staff waking her up in the middle of the night to give PO pain meds so that the pain does not get away from her overnight. Defer PO narcotic management to primary team since they started oxycontin and this is not my typical first-line PO narcotic. If this is not enough, consider scheduling PO tramadol.    ID - cultures taken in OR (NGTD), currently rocephin/ flagyl/doxycycline/zyvox per ID VTE - lovenox FEN - IVF, Reg  Foley - none   Depression Iron deficiency anemia Obesity BMI 30.03  LOS: 12 days    Hosie Spangle, PA-C [facility] Use [email or web address] to contact on call provider  02/21/2022

## 2022-02-21 NOTE — Progress Notes (Signed)
Patient ID: Paula Lucas, female   DOB: [birth year], 31 y.o.   MRN: [id number]   Assessment/Plan: Principal Problem:   Pelvic abscess  Active Problems:   Sepsis (HCC)   MRSA (methicillin resistant staph aureus) culture positive   Hepatic abscess   Hx of Fitz-Hugh-Curtis syndrome   Protein-calorie malnutrition (HCC)  Pelvic abscess/ Hepatic abscess Patient has multiple abscesses in her abdomen and pelvis related to prior PID with current GC and Chlamydia negative. Patient now s/p ex lap with pelvic and abdominal wash out, POD #5. Has drains in place that are draining serosanguineous material only (approximately 25 cc yesterday). Cultures from surgery are sterile. Her WBC is coming down and is in the normal range.  She is afebrile.  Reports slight improvement in her abdominal pain.   +ve Flatus   Continue Vancomycin, Rocephin, Doxycycline, Flagyl--appreciate ID input   Pain medications as needed   W-->D dressing changes ordered--declined wound vac-and her wound was fresh we will have wound reassessed for potential NPWT placement early next week.   Increase ambulation     If further surgery is planned, would need GYN ONC consult.   Severe protein calorie malnutrition Prealbumin was 7.9 she is now status post core track placement with tube feeds ongoing.  She does note some nausea associated with this.  Would strongly consider advancing her diet.   Sepsis (HCC) is not currently actively septic .   MRSA (methicillin resistant staph aureus) culture positive On vancomycin   Hypokalemia Replete and trend   Iron deficiency anemia No active bleeding Could consider IV iron (Venofer) prior to discharge.  Appears she is getting this today.   New onset shortness of breath X-ray findings consistent with fluid overload.   S/p lasix She remains up 6L since admission  Subjective: Interval History:Still with some SOB. Having an MRI today due to back pain.  Objective: Vital signs  in last 24 hours: Temp:  [97.8 F (36.6 C)-97.9 F (36.6 C)] 97.9 F (36.6 C) (07/02 0742) Pulse Rate:  [87-108] 91 (07/02 0742) Resp:  [17-31] 17 (07/02 0742) BP: (114-144)/(75-92) 114/75 (07/02 0742) SpO2:  [92 %-96 %] 94 % (07/02 0742) Weight:  [83.6 kg] 83.6 kg (07/02 0500)  Intake/Output from previous day: 07/01 0701 - 07/02 0700 In: 1066  Out: 1600 [Urine:1600] Intake/Output this shift: No intake/output data recorded.  Physical exam General appearance: alert, cooperative, and appears stated age Head: Normocephalic, without obvious abnormality, atraumatic Lungs: Normal effort Heart: regular rate, regular rhythm Abdomen: less distended, diffusely tender  Incision: Dressing is dry and intact.  2 JP drains present with serosanguineous fluid. Extremities: Homans sign is negative, no sign of DVT Skin: Skin color, texture, turgor normal. No rashes or lesions Neurologic: Grossly normal  Results for orders placed or performed during the hospital encounter of 02/08/22 (from the past 24 hour(s))  Glucose, capillary     Status: Abnormal   Collection Time: 02/20/22  1:59 PM  Result Value Ref Range   Glucose-Capillary 118 (H) 70 - 99 mg/dL  Glucose, capillary     Status: Abnormal   Collection Time: 02/20/22  4:50 PM  Result Value Ref Range   Glucose-Capillary 111 (H) 70 - 99 mg/dL  Glucose, capillary     Status: Abnormal   Collection Time: 02/20/22  8:28 PM  Result Value Ref Range   Glucose-Capillary 118 (H) 70 - 99 mg/dL  Glucose, capillary     Status: Abnormal   Collection Time: 02/20/22 11:44 PM  Result Value  Ref Range   Glucose-Capillary 105 (H) 70 - 99 mg/dL  Glucose, capillary     Status: Abnormal   Collection Time: 02/21/22  4:00 AM  Result Value Ref Range   Glucose-Capillary 116 (H) 70 - 99 mg/dL  Magnesium     Status: None   Collection Time: 02/21/22  4:13 AM  Result Value Ref Range   Magnesium 1.7 1.7 - 2.4 mg/dL  Phosphorus     Status: None   Collection Time:  02/21/22  4:13 AM  Result Value Ref Range   Phosphorus 4.0 2.5 - 4.6 mg/dL  CBC with Differential/Platelet     Status: Abnormal   Collection Time: 02/21/22  4:13 AM  Result Value Ref Range   WBC 8.2 4.0 - 10.5 K/uL   RBC 2.85 (L) 3.87 - 5.11 MIL/uL   Hemoglobin 8.1 (L) 12.0 - 15.0 g/dL   HCT 02.6 (L) 37.8 - 58.8 %   MCV 90.9 80.0 - 100.0 fL   MCH 28.4 26.0 - 34.0 pg   MCHC 31.3 30.0 - 36.0 g/dL   RDW 50.2 (H) 77.4 - 12.8 %   Platelets 519 (H) 150 - 400 K/uL   nRBC 0.2 0.0 - 0.2 %   Neutrophils Relative % 65 %   Neutro Abs 5.3 1.7 - 7.7 K/uL   Lymphocytes Relative 26 %   Lymphs Abs 2.1 0.7 - 4.0 K/uL   Monocytes Relative 7 %   Monocytes Absolute 0.6 0.1 - 1.0 K/uL   Eosinophils Relative 1 %   Eosinophils Absolute 0.1 0.0 - 0.5 K/uL   Basophils Relative 1 %   Basophils Absolute 0.1 0.0 - 0.1 K/uL   nRBC 0 0 /100 WBC   Abs Immature Granulocytes 0.00 0.00 - 0.07 K/uL   Polychromasia PRESENT   Basic metabolic panel     Status: Abnormal   Collection Time: 02/21/22  4:13 AM  Result Value Ref Range   Sodium 139 135 - 145 mmol/L   Potassium 3.3 (L) 3.5 - 5.1 mmol/L   Chloride 100 98 - 111 mmol/L   CO2 32 22 - 32 mmol/L   Glucose, Bld 108 (H) 70 - 99 mg/dL   BUN 7 6 - 20 mg/dL   Creatinine, Ser 7.86 0.44 - 1.00 mg/dL   Calcium 8.3 (L) 8.9 - 10.3 mg/dL   GFR, Estimated >76 >72 mL/min   Anion gap 7 5 - 15  Glucose, capillary     Status: Abnormal   Collection Time: 02/21/22  7:45 AM  Result Value Ref Range   Glucose-Capillary 112 (H) 70 - 99 mg/dL    Studies/Results: DG CHEST PORT 1 VIEW  Result Date: 02/20/2022 CLINICAL DATA:  Shortness of breath EXAM: PORTABLE CHEST - 1 VIEW COMPARISON:  02/18/2022 FINDINGS: Unchanged mild cardiomegaly and mild pulmonary vascular congestion. Bibasilar opacities likely combination of small pleural effusions with adjacent atelectasis. Feeding tube terminates in the left upper quadrant. Right upper extremity PICC terminates at the cavoatrial  junction. IMPRESSION: 1. Mild cardiomegaly. 2. Mild pulmonary vascular congestion. 3. Small bilateral pleural effusions with adjacent atelectasis. Electronically Signed   By: Acquanetta Belling M.D.   On: 02/20/2022 09:30   DG Abd Portable 1V  Result Date: 02/19/2022 CLINICAL DATA:  Provided history: Encounter for feeding tube placement. EXAM: PORTABLE ABDOMEN - 1 VIEW COMPARISON:  Abdominal radiograph 02/18/2022. FINDINGS: An enteric tube passes below the level left hemidiaphragm with tip projecting in the expected location of the pylorus or first portion of the duodenum. Nonobstructive  bowel gas pattern within the relies abdomen. No acute bony abnormality identified. IMPRESSION: Enteric tube present with tip projecting in the expected location of the pylorus or first portion of the duodenum. Nonspecific, nonobstructive bowel gas pattern within the visualized abdomen. Electronically Signed   By: Jackey Loge D.O.   On: 02/19/2022 14:20   DG CHEST PORT 1 VIEW  Result Date: 02/18/2022 CLINICAL DATA:  Central line placement EXAM: PORTABLE CHEST 1 VIEW COMPARISON:  Radiograph 09/06/2021 FINDINGS: Right upper extremity PICC tip crosses the midline, across the brachiocephalic confluence, tip overlies the left brachiocephalic vein. Borderline enlarged cardiac silhouette. Bibasilar consolidations, left greater than right. Small bilateral pleural effusions. Low lung volumes. No pneumothorax. No acute osseous abnormality. IMPRESSION: Right upper extremity PICC tip courses across the brachiocephalic confluence, tip overlying the left brachiocephalic vein. Recommend repositioning. Bilateral pleural effusions with adjacent bibasilar atelectasis, left greater than right. Low lung volumes. These results will be called to the ordering clinician or representative by the Radiologist Assistant, and communication documented in the PACS or [employer]. Electronically Signed   By: Caprice Renshaw M.D.   On: 02/18/2022 18:18   [place]  EKG SITE RITE  Result Date: 02/18/2022 If Site Rite image not attached, placement could not be confirmed due to current cardiac rhythm.  DG Abd Portable 1V  Result Date: 02/18/2022 CLINICAL DATA:  Abdominal distension. EXAM: PORTABLE ABDOMEN - 1 VIEW COMPARISON:  Abdominal CT February 14, 2022 FINDINGS: Drainage catheters again seen in the lower abdomen/pelvis. No significant change in the appearance of the bowel pattern. No evidence of small-bowel obstruction. IMPRESSION: Stable appearance of the bowel pattern. Electronically Signed   By: Ted Mcalpine M.D.   On: 02/18/2022 12:52   CT ABDOMEN PELVIS W CONTRAST  Result Date: 02/14/2022 CLINICAL DATA:  Follow-up intra-abdominal abscesses. EXAM: CT ABDOMEN AND PELVIS WITH CONTRAST TECHNIQUE: Multidetector CT imaging of the abdomen and pelvis was performed using the standard protocol following bolus administration of intravenous contrast. RADIATION DOSE REDUCTION: This exam was performed according to the departmental dose-optimization program which includes automated exposure control, adjustment of the mA and/or kV according to patient size and/or use of iterative reconstruction technique. CONTRAST:  OMNIPAQUE IOHEXOL 300 MG/ML  SOLN COMPARISON:  CT scan 02/08/2022 FINDINGS: Lower chest: Persistent moderate-sized pleural effusions with overlying atelectasis. Hepatobiliary: Small residual lentiform rim enhancing abscess at the dome of the liver anteriorly measuring a maximum of 5.6 x 1.1 cm. This previously measured approximately 9.3 x 1.1 cm. Inferiorly there is a persistent perihepatic abscess. It measures approximately 9.5 x 3.1 cm and previously measured 10.1 x 3.7 cm. No worrisome hepatic lesions or intrahepatic biliary dilatation. The gallbladder is unremarkable. No common bile duct dilatation. Pancreas: No mass, inflammation or ductal dilatation. Spleen: Normal size.  No focal lesions. Adrenals/Urinary Tract: Adrenal glands and kidneys are  unremarkable and stable. The bladder is grossly normal. Stomach/Bowel: The stomach, duodenum, small bowel and colon grossly normal. Vascular/Lymphatic: The aorta and branch vessels are patent. The major venous structures are patent. Stable borderline enlarged mesenteric and retroperitoneal lymph nodes, likely reactive/inflammatory. Reproductive: The uterus and ovaries are stable. Other: Percutaneous drainage catheter in the upper right pelvis with a persistent rim enhancing abscess measuring approximately 8.8 x 3.5 cm and previously measuring 9.8 x 5.6 cm. A second drainage catheter in the lower right cul-de-sac area with a small persistent rim enhancing abscess measuring 4.1 x 2.0 cm. This previously measured 5.2 x 4.9 cm. Left-sided rounded pelvic abscess measures 3.6 x 2.9  cm on image 71/3. This previously measured 3.5 x 2.7 cm. Persistent diffuse mesenteric and subcutaneous inflammations/edema. Musculoskeletal: No significant bony findings. IMPRESSION: 1. Persistent moderate-sized pleural effusions with overlying atelectasis. 2. Persistent but slightly smaller undrained perihepatic abscesses. 3. Two right-sided pelvic drainage catheters with persistent but smaller surrounding abscesses. 4. Left-sided undrained pelvic abscess is slightly smaller also. 5. Persistent diffuse mesenteric and subcutaneous inflammation. 6. Stable borderline enlarged mesenteric and retroperitoneal lymph nodes, likely reactive/inflammatory. Electronically Signed   By: Rudie Meyer M.D.   On: 02/14/2022 14:46   MR Lumbar Spine W Wo Contrast  Result Date: 02/13/2022 CLINICAL DATA:  Initial evaluation for back pain, history of MRSA bacteremia. EXAM: MRI LUMBAR SPINE WITHOUT AND WITH CONTRAST TECHNIQUE: Multiplanar and multiecho pulse sequences of the lumbar spine were obtained without and with intravenous contrast. CONTRAST:  7.29mL GADAVIST GADOBUTROL 1 MMOL/ML IV SOLN COMPARISON:  Prior CT from 02/08/2022. FINDINGS: Segmentation:  Standard. Lowest well-formed disc space labeled the L5-S1 level. Alignment: Mild levoscoliosis with straightening of the normal lumbar lordosis. No listhesis. Vertebrae: Vertebral body height maintained without acute or chronic fracture. Bone marrow signal intensity diffusely decreased on T1 weighted sequence, nonspecific, but most commonly related to anemia, smoking or obesity. No discrete or worrisome osseous lesions. No evidence for osteomyelitis discitis or septic arthritis within the lumbar spine. Conus medullaris and cauda equina: Conus extends to the L1-2 level. Conus and cauda equina appear normal. Paraspinal and other soft tissues: Paraspinous soft tissues demonstrate no acute finding. Multi loculated collections/abscesses with surrounding enhancement partially seen within the partially visualized pelvis, better characterized on recent CT. Remainder of the visualized visceral structures otherwise unremarkable. Disc levels: L1-2:  Unremarkable. L2-3: Small left subarticular disc protrusion mildly indents the left ventral thecal sac (series 12, image 16). Borderline mild narrowing of the left lateral recess. Central canal remains widely patent. No foraminal stenosis. L3-4:  Unremarkable. L4-5:  Unremarkable. L5-S1: Left subarticular disc protrusion extends into the left lateral recess, contacting the descending left S1 nerve root (series 12, image 34). Resultant mild left lateral recess stenosis. Central canal remains patent. No significant foraminal stenosis. IMPRESSION: 1. No MRI evidence for acute infection within the lumbar spine. 2. Left subarticular disc protrusion at L5-S1, contacting the descending left S1 nerve root in the left lateral recess. 3. Small left subarticular disc protrusion at L2-3 with borderline mild left lateral recess stenosis. 4. Multiloculated collections/abscesses within the partially visualized pelvis, better characterized on recent CT. Electronically Signed   By: Rise Mu M.D.   On: 02/13/2022 04:04   ECHOCARDIOGRAM COMPLETE  Result Date: 02/12/2022    ECHOCARDIOGRAM REPORT   Patient Name:   CHRISHAUNA MEE Date of Exam: 02/12/2022 Medical Rec #:  [id number]        Height:       63.0 in Accession #:    [id number]       Weight:       169.5 lb Date of Birth:  May 17, 1991        BSA:          1.802 m Patient Age:    31 years         BP:           113/87 mmHg Patient Gender: F                HR:           98 bpm. Exam Location:  Inpatient Procedure: 2D Echo, Cardiac Doppler and Color Doppler  Indications:   Staph aureus infection  History:       Patient has no prior history of Echocardiogram examinations.                Sepsis.  Sonographer:   Ross Ludwig RDCS (AE) Referring      TRUNG T VU Phys: IMPRESSIONS  1. Left ventricular ejection fraction, by estimation, is 60 to 65%. The left ventricle has normal function. The left ventricle has no regional wall motion abnormalities. Left ventricular diastolic parameters were normal.  2. Right ventricular systolic function is normal. The right ventricular size is normal.  3. The mitral valve is normal in structure. Trivial mitral valve regurgitation. No evidence of mitral stenosis.  4. The aortic valve is normal in structure. Aortic valve regurgitation is not visualized. No aortic stenosis is present. FINDINGS  Left Ventricle: Left ventricular ejection fraction, by estimation, is 60 to 65%. The left ventricle has normal function. The left ventricle has no regional wall motion abnormalities. The left ventricular internal cavity size was normal in size. There is  no left ventricular hypertrophy. Left ventricular diastolic parameters were normal. Right Ventricle: The right ventricular size is normal. Right vetricular wall thickness was not well visualized. Right ventricular systolic function is normal. Left Atrium: Left atrial size was normal in size. Right Atrium: Right atrial size was normal in size. Pericardium: There is no evidence  of pericardial effusion. Mitral Valve: The mitral valve is normal in structure. Trivial mitral valve regurgitation. No evidence of mitral valve stenosis. Tricuspid Valve: The tricuspid valve is normal in structure. Tricuspid valve regurgitation is trivial. Aortic Valve: The aortic valve is normal in structure. Aortic valve regurgitation is not visualized. No aortic stenosis is present. Aortic valve mean gradient measures 4.0 mmHg. Aortic valve peak gradient measures 7.1 mmHg. Aortic valve area, by VTI measures 2.25 cm. Pulmonic Valve: The pulmonic valve was normal in structure. Pulmonic valve regurgitation is not visualized. Aorta: The aortic root and ascending aorta are structurally normal, with no evidence of dilitation. IAS/Shunts: The atrial septum is grossly normal.  LEFT VENTRICLE PLAX 2D LVIDd:         4.80 cm LVIDs:         3.40 cm LV PW:         0.90 cm LV IVS:        1.10 cm LVOT diam:     1.90 cm LV SV:         49 LV SV Index:   27 LVOT Area:     2.84 cm  RIGHT VENTRICLE             IVC RV Basal diam:  2.30 cm     IVC diam: 1.50 cm RV S prime:     12.40 cm/s TAPSE (M-mode): 2.1 cm LEFT ATRIUM             Index        RIGHT ATRIUM          Index LA diam:        3.80 cm 2.11 cm/m   RA Area:     9.13 cm LA Vol (A2C):   26.4 ml 14.65 ml/m  RA Volume:   15.80 ml 8.77 ml/m LA Vol (A4C):   29.7 ml 16.48 ml/m LA Biplane Vol: 29.3 ml 16.26 ml/m  AORTIC VALVE AV Area (Vmax):    2.24 cm AV Area (Vmean):   2.21 cm AV Area (VTI):     2.25 cm AV  Vmax:           133.00 cm/s AV Vmean:          87.900 cm/s AV VTI:            0.218 m AV Peak Grad:      7.1 mmHg AV Mean Grad:      4.0 mmHg LVOT Vmax:         105.00 cm/s LVOT Vmean:        68.600 cm/s LVOT VTI:          0.173 m LVOT/AV VTI ratio: 0.79  AORTA Ao Root diam: 2.90 cm Ao Asc diam:  2.70 cm  SHUNTS Systemic VTI:  0.17 m Systemic Diam: 1.90 cm Kristeen MissPhilip Nahser MD Electronically signed by Kristeen MissPhilip Nahser MD Signature Date/Time: 02/12/2022/4:39:15 PM    Final     VAS US LOWER EXTREMITY VENOUS (DVT)  Result Date: 02/12/2022  Lower Venous DVT Study Patient Name:  Bedelia PersonJAVONNA S Slabaugh  Date of Exam:   02/12/2022 Medical Rec #: [id number]         Accession #:    [id number] Date of Birth: 1991-07-12         Patient Gender: F Patient Age:   [age] years [facility] Procedure:      VAS US LOWER EXTREMITY VENOUS (DVT) Referring Phys: JULIE MACHEN --------------------------------------------------------------------------------  Indications: Pain.  Risk Factors: None identified. Limitations: Poor patient cooperation, patient pain tolerance. Comparison Study: No prior studies. Performing Technologist: Chanda BusingGregory Collins RVT  Examination Guidelines: A complete evaluation includes B-mode imaging, spectral Doppler, color Doppler, and power Doppler as needed of all accessible portions of each vessel. Bilateral testing is considered an integral part of a complete examination. Limited examinations for reoccurring indications may be performed as noted. The reflux portion of the exam is performed with the patient in reverse Trendelenburg.  +---------+---------------+---------+-----------+----------+--------------+ RIGHT    CompressibilityPhasicitySpontaneityPropertiesThrombus Aging +---------+---------------+---------+-----------+----------+--------------+ CFV      Full           Yes      Yes                                 +---------+---------------+---------+-----------+----------+--------------+ SFJ      Full                                                        +---------+---------------+---------+-----------+----------+--------------+ FV Prox  Full                                                        +---------+---------------+---------+-----------+----------+--------------+ FV Mid   Full                                                        +---------+---------------+---------+-----------+----------+--------------+ FV DistalFull                                                         +---------+---------------+---------+-----------+----------+--------------+  PFV      Full                                                        +---------+---------------+---------+-----------+----------+--------------+ POP      Full           Yes      Yes                                 +---------+---------------+---------+-----------+----------+--------------+ PTV      Full                                                        +---------+---------------+---------+-----------+----------+--------------+ PERO     Full                                                        +---------+---------------+---------+-----------+----------+--------------+   +----+---------------+---------+-----------+----------+--------------+ LEFTCompressibilityPhasicitySpontaneityPropertiesThrombus Aging +----+---------------+---------+-----------+----------+--------------+ CFV Full           Yes      Yes                                 +----+---------------+---------+-----------+----------+--------------+    Summary: RIGHT: - There is no evidence of deep vein thrombosis in the lower extremity.  - No cystic structure found in the popliteal fossa.  LEFT: - No evidence of common femoral vein obstruction.  *See table(s) above for measurements and observations. Electronically signed by Sherald Hess MD on 02/12/2022 at 11:23:51 AM.    Final    CT IMAGE GUIDED DRAINAGE BY PERCUTANEOUS CATHETER  Result Date: 02/10/2022 INDICATION: History of tubo-ovarian abscess, post CT-guided placement right lower abdominal/pelvic drainage catheter on 02/05/2022. Postprocedural CT scan performed 02/08/2022 demonstrates an unchanged fluid collection within the right lower pelvis and as such request made for placement of an additional percutaneous drainage catheter for infection source control purposes. Additionally, there is request for CT-guided exchange  and/or repositioning of the pre-existing drainage catheter into the more dominant component of the residual pelvic abscess EXAM: 1. CT GUIDED RIGHT TRANS GLUTEAL APPROACH TUBO-OVARIAN ABSCESS DRAINAGE CATHETER PLACEMENT 2. CT-GUIDED EXCHANGE AND REPOSITIONING OF EXISTING RIGHT LOWER ABDOMINAL/PELVIC DRAINAGE CATHETER COMPARISON:  CT abdomen pelvis-02/08/2022; 02/04/2022 CT guided right lower abdominal/pelvic abscess drainage catheter placement-02/05/2022 MEDICATIONS: The patient is currently admitted to the hospital and receiving intravenous antibiotics. The antibiotics were administered within an appropriate time frame prior to the initiation of the procedure. ANESTHESIA/SEDATION: Moderate (conscious) sedation was employed during this procedure as administered by the Interventional Radiology RN. A total of Versed 4 mg, Dilaudid 1 mg and Fentanyl 150 mcg was administered intravenously. Moderate Sedation Time: 60 minutes. The patient's level of consciousness and vital signs were monitored continuously by radiology nursing throughout the procedure under my direct supervision. CONTRAST:  None COMPLICATIONS: None immediate. PROCEDURE: RADIATION DOSE REDUCTION: This exam was performed according to the departmental dose-optimization program which includes  automated exposure control, adjustment of the mA and/or kV according to patient size and/or use of iterative reconstruction technique. Informed written consent was obtained from the patient after a discussion of the risks, benefits and alternatives to treatment. The patient was initially placed prone on the CT gantry and a pre procedural CT was performed re-demonstrating the known abscess/fluid collection within the right hemipelvis with dominant component measuring approximately 5.5 x 5.4 cm (image 11, series 2). The procedure was planned. A timeout was performed prior to the initiation of the procedure. The skin overlying the right buttocks was prepped and draped in  the usual sterile fashion. The overlying soft tissues were anesthetized with 1% lidocaine with epinephrine. Appropriate trajectory was planned with the use of a 22 gauge spinal needle. An 18 gauge trocar needle was advanced into the abscess/fluid collection and a short Amplatz super stiff wire was coiled within the collection. Appropriate positioning was confirmed with a limited CT scan. The tract was serially dilated allowing placement of a 10 [place] all-purpose drainage catheter. Appropriate positioning was confirmed with a limited postprocedural CT scan. Approximately 65 ml of purulent appearing serous fluid was aspirated. The tube was connected to a drainage bag and secured in place within interrupted suture and a Stat Lock device. A dressing was applied. The patient was then repositioned supine on the CT gantry and noncontrast images were obtained of the lower pelvis and pre-existing right anterior approach lower abdominal/pelvic drainage catheter. Attempts were made to retract the existing drainage catheter into the dominant component of the residual collection however this proved uncomfortable for the patient. As such, the external portion of the drainage catheter was prepped and draped in the usual sterile fashion. The external portion of the drainage catheter was cut and cannulated with a short Amplatz wire. Existing 12 French drainage catheter was then exchanged for a new 12 French percutaneous catheter which under intermittent CT guidance was repositioned into the dominant residual component of the right lower quadrant collection (series 11). The drainage catheter was connected to a gravity bag and secured in place within interrupted suture and a Stat Lock device. Patient tolerated the above procedures well (though did require a generous amount of conscious sedation medications) without immediate postprocedural complication. IMPRESSION: 1. Successful CT guided placement of a 10 [place] all purpose drain  catheter into the undrained pelvic abscess via right trans gluteal approach with aspiration of 65 mL of purulent fluid. Samples were sent to the laboratory as requested by the ordering clinical team. 2. Successful CT guided exchange and repositioning of pre-existing 12 French right anterior abdominal/pelvic drainage catheter with end now coiled and locked within the residual anteriorly located abscess cavity. Electronically Signed   By: Simonne Come M.D.   On: 02/10/2022 16:33   CT ABSCESS CATH EXCHANGE  Result Date: 02/10/2022 INDICATION: History of tubo-ovarian abscess, post CT-guided placement right lower abdominal/pelvic drainage catheter on 02/05/2022. Postprocedural CT scan performed 02/08/2022 demonstrates an unchanged fluid collection within the right lower pelvis and as such request made for placement of an additional percutaneous drainage catheter for infection source control purposes. Additionally, there is request for CT-guided exchange and/or repositioning of the pre-existing drainage catheter into the more dominant component of the residual pelvic abscess EXAM: 1. CT GUIDED RIGHT TRANS GLUTEAL APPROACH TUBO-OVARIAN ABSCESS DRAINAGE CATHETER PLACEMENT 2. CT-GUIDED EXCHANGE AND REPOSITIONING OF EXISTING RIGHT LOWER ABDOMINAL/PELVIC DRAINAGE CATHETER COMPARISON:  CT abdomen pelvis-02/08/2022; 02/04/2022 CT guided right lower abdominal/pelvic abscess drainage catheter placement-02/05/2022 MEDICATIONS: The patient is currently  admitted to the hospital and receiving intravenous antibiotics. The antibiotics were administered within an appropriate time frame prior to the initiation of the procedure. ANESTHESIA/SEDATION: Moderate (conscious) sedation was employed during this procedure as administered by the Interventional Radiology RN. A total of Versed 4 mg, Dilaudid 1 mg and Fentanyl 150 mcg was administered intravenously. Moderate Sedation Time: 60 minutes. The patient's level of consciousness and vital  signs were monitored continuously by radiology nursing throughout the procedure under my direct supervision. CONTRAST:  None COMPLICATIONS: None immediate. PROCEDURE: RADIATION DOSE REDUCTION: This exam was performed according to the departmental dose-optimization program which includes automated exposure control, adjustment of the mA and/or kV according to patient size and/or use of iterative reconstruction technique. Informed written consent was obtained from the patient after a discussion of the risks, benefits and alternatives to treatment. The patient was initially placed prone on the CT gantry and a pre procedural CT was performed re-demonstrating the known abscess/fluid collection within the right hemipelvis with dominant component measuring approximately 5.5 x 5.4 cm (image 11, series 2). The procedure was planned. A timeout was performed prior to the initiation of the procedure. The skin overlying the right buttocks was prepped and draped in the usual sterile fashion. The overlying soft tissues were anesthetized with 1% lidocaine with epinephrine. Appropriate trajectory was planned with the use of a 22 gauge spinal needle. An 18 gauge trocar needle was advanced into the abscess/fluid collection and a short Amplatz super stiff wire was coiled within the collection. Appropriate positioning was confirmed with a limited CT scan. The tract was serially dilated allowing placement of a 10 [place] all-purpose drainage catheter. Appropriate positioning was confirmed with a limited postprocedural CT scan. Approximately 65 ml of purulent appearing serous fluid was aspirated. The tube was connected to a drainage bag and secured in place within interrupted suture and a Stat Lock device. A dressing was applied. The patient was then repositioned supine on the CT gantry and noncontrast images were obtained of the lower pelvis and pre-existing right anterior approach lower abdominal/pelvic drainage catheter. Attempts were  made to retract the existing drainage catheter into the dominant component of the residual collection however this proved uncomfortable for the patient. As such, the external portion of the drainage catheter was prepped and draped in the usual sterile fashion. The external portion of the drainage catheter was cut and cannulated with a short Amplatz wire. Existing 12 French drainage catheter was then exchanged for a new 12 French percutaneous catheter which under intermittent CT guidance was repositioned into the dominant residual component of the right lower quadrant collection (series 11). The drainage catheter was connected to a gravity bag and secured in place within interrupted suture and a Stat Lock device. Patient tolerated the above procedures well (though did require a generous amount of conscious sedation medications) without immediate postprocedural complication. IMPRESSION: 1. Successful CT guided placement of a 10 [place] all purpose drain catheter into the undrained pelvic abscess via right trans gluteal approach with aspiration of 65 mL of purulent fluid. Samples were sent to the laboratory as requested by the ordering clinical team. 2. Successful CT guided exchange and repositioning of pre-existing 12 French right anterior abdominal/pelvic drainage catheter with end now coiled and locked within the residual anteriorly located abscess cavity. Electronically Signed   By: Simonne Come M.D.   On: 02/10/2022 16:33   CT ABDOMEN PELVIS W CONTRAST  Result Date: 02/08/2022 CLINICAL DATA:  Sepsis EXAM: CT ABDOMEN AND PELVIS WITH CONTRAST TECHNIQUE:  Multidetector CT imaging of the abdomen and pelvis was performed using the standard protocol following bolus administration of intravenous contrast. RADIATION DOSE REDUCTION: This exam was performed according to the departmental dose-optimization program which includes automated exposure control, adjustment of the mA and/or kV according to patient size and/or use  of iterative reconstruction technique. CONTRAST:  OMNIPAQUE IOHEXOL 300 MG/ML  SOLN COMPARISON:  Previous studies including the CT done on 2022-02-17 FINDINGS: Lower chest: Small bilateral pleural effusions are seen more so on the right side. There are linear patchy infiltrates in the lingula and both lower lobes suggesting atelectasis/pneumonia. Hepatobiliary: No focal abnormality is seen in the liver. There is no dilation of bile ducts. Gallbladder is unremarkable. Small ascites is noted in the perihepatic region. Pancreas: No focal abnormality is seen. Spleen: Unremarkable. Adrenals/Urinary Tract: Adrenals are not enlarged. There is no hydronephrosis. There are no renal or ureteral stones. Urinary bladder is unremarkable. There is 3 mm low-density in the midportion of left kidney which is too small to be characterized. Stomach/Bowel: Stomach is not distended. Small bowel loops are not dilated. Appendix is not seen. There is no significant wall thickening in the colon. There is interval placement of percutaneous drainage catheter with its tip in the right lower abdomen. There is interval decrease in size of the complex fluid collection in the right lower quadrant. There is 4 cm loculated fluid collection slightly to the left of midline at the level of pelvic inlet. There is 7.2 x 5.3 x 4.9 cm loculated fluid collection with minimally enhancing margin in the cul-de-sac in the pelvis. There are no pockets of air within these loculated fluid collections. There is stranding in the fat planes in the lower abdomen and pelvis. Vascular/Lymphatic: Vascular structures are unremarkable. Reproductive: Uterus is to the right of midline. Other: There is no pneumoperitoneum. Musculoskeletal: No acute findings are seen. IMPRESSION: Interval placement of percutaneous drainage catheter within air-fluid collection in the right lower abdomen with interval decrease in size. There is 7.2 cm loculated fluid collection in the  cul-de-sac in the pelvis with slightly enhancing margins. There is another 4 cm loculated fluid collection in the left side of the pelvis. These may suggest loculated serous fluid collections or abscesses. Small ascites is noted in the perihepatic region. There is no evidence of intestinal obstruction or pneumoperitoneum. There is no hydronephrosis. Small bilateral pleural effusions. There are linear patchy infiltrates in both lower lung fields suggesting atelectasis/pneumonia. Electronically Signed   By: Ernie Avena M.D.   On: 02/08/2022 09:41   CT IMAGE GUIDED DRAINAGE BY PERCUTANEOUS CATHETER  Result Date: 02/05/2022 INDICATION: PID with large abdominopelvic fluid collection. EXAM: CT GUIDED DRAINAGE CATHETER PLACEMENT INTO ABDOMINOPELVIC FLUID COLLECTION RADIATION DOSE REDUCTION: This exam was performed according to the departmental dose-optimization program which includes automated exposure control, adjustment of the mA and/or kV according to patient size and/or use of iterative reconstruction technique. COMPARISON:  CT AP, February 17, 2022 MEDICATIONS: The patient is currently admitted to the hospital and receiving intravenous antibiotics. The antibiotics were administered within an appropriate time frame prior to the initiation of the procedure. ANESTHESIA/SEDATION: Moderate (conscious) sedation was employed during this procedure. A total of Versed 3 mg and Fentanyl 75 mcg was administered intravenously. Additionally, 1 mg Dilaudid was administered IV. Moderate Sedation Time: 19 minutes. The patient's level of consciousness and vital signs were monitored continuously by radiology nursing throughout the procedure under my direct supervision. CONTRAST:  None COMPLICATIONS: None immediate. PROCEDURE: Informed written consent was obtained from  the patient and/or patient's representative after a discussion of the risks, benefits and alternatives to treatment. The patient was placed supine on the CT gantry  and a pre procedural CT was performed re-demonstrating the known abscess/fluid collection within the lower abdomen/pelvis. The procedure was planned. A timeout was performed prior to the initiation of the procedure. The RIGHT lower quadrant was prepped and draped in the usual sterile fashion. The overlying soft tissues were anesthetized with 1% lidocaine with epinephrine. Appropriate trajectory was planned with the use of a 22 gauge spinal needle. An 18 gauge trocar needle was advanced into the abscess/fluid collection and a short Amplatz super stiff wire was coiled within the collection. Appropriate positioning was confirmed with a limited CT scan. The tract was serially dilated allowing placement of a 12 [place] all-purpose drainage catheter. Appropriate positioning was confirmed with a limited postprocedural CT scan. 5 mL ml of serous fluid was aspirated and submitted for analysis. The tube was connected to a drainage bag and sutured in place. A dressing was placed. The patient tolerated the procedure well without immediate post procedural complication. IMPRESSION: Successful CT guided placement of a 12 Fr drainage catheter into the abdominopelvic collection with aspiration of serous fluid, as above. Samples were sent to the laboratory as requested by the ordering clinical team. Michaelle Birks, MD [facility] Radiology Electronically Signed   By: Michaelle Birks M.D.   On: 02/05/2022 18:08   CT ABDOMEN PELVIS WO CONTRAST  Result Date: 02/04/2022 CLINICAL DATA:  Abdominal pain, acute, nonlocalized septic shock, abnormal uterine US, concern for PID EXAM: CT ABDOMEN AND PELVIS WITHOUT CONTRAST TECHNIQUE: Multidetector CT imaging of the abdomen and pelvis was performed following the standard protocol without IV contrast. RADIATION DOSE REDUCTION: This exam was performed according to the departmental dose-optimization program which includes automated exposure control,  adjustment of the mA and/or kV according to patient size and/or use of iterative reconstruction technique. COMPARISON:  Pelvic ultrasound today FINDINGS: Lower chest: Pleural thickening noted peripherally in the right lower hemithorax, possibly representing trace loculated pleural effusion. No confluent airspace opacities. Hepatobiliary: No focal hepatic abnormality. Gallbladder unremarkable. Pancreas: No focal abnormality or ductal dilatation. Spleen: No focal abnormality.  Normal size. Adrenals/Urinary Tract: No adrenal abnormality. No focal renal abnormality. No stones or hydronephrosis. Urinary bladder is unremarkable. Stomach/Bowel: Wall thickening noted in the cecum which is immediately adjacent to the large pelvic fluid collection described below. This could reflect colitis or be secondarily inflamed. Stomach and small bowel grossly unremarkable. Vascular/Lymphatic: No evidence of aneurysm or adenopathy. Reproductive: Large complex cystic area or fluid collection seen in the pelvis extending from the cul-de-sac superiorly into the right lower quadrant adjacent to the cecum. This measures approximately 12 x 8 cm on image 64 of series 3. Internal areas of increased density. This could reflect blood or abscess/PID. Uterus grossly unremarkable on this noncontrast study. Ovaries not definitively visualized. Other: There is free fluid adjacent to the liver and spleen. Musculoskeletal: No acute bony abnormality. IMPRESSION: Complex process in the pelvis including what appears to be a large complex fluid collection measuring 12 x 8 cm extending from the cul-de-sac superiorly to the cecum which could reflect blood products or abscess. This also could conceivably be related to and ovary. Neither ovary definitively identified. Wall thickening in the cecum could reflect colitis or secondary inflammation related to the pelvic process. Perihepatic and perisplenic ascites. Possible trace loculated right pleural effusion  peripherally in the right lower hemithorax. Electronically Signed  By: Rolm Baptise M.D.   On: 02/04/2022 22:29   US PELVIC COMPLETE WITH TRANSVAGINAL  Addendum Date: 02/04/2022   ADDENDUM REPORT: 02/04/2022 13:44 ADDENDUM: In light of negative quantitative beta HCG and no documented pregnancy the possibility of infectious causes for the above findings may be more likely but findings are essentially quite nonspecific at this time. Even noncontrast CT could prove beneficial to allow for assessment of pelvic fluid to determine whether this represents frank hemoperitoneum and to assess for any associated inflammatory changes that may be present not well assessed on ultrasound. These results were called by telephone at the time of interpretation on 02/04/2022 at 1:44 pm to provider [facility] , who verbally acknowledged these results. Electronically Signed   By: Zetta Bills M.D.   On: 02/04/2022 13:44   Result Date: 02/04/2022 CLINICAL DATA:  A 31 year old female presents with pelvic pain and bleeding. Patient reports positive pregnancy but urine pregnancy currently negative by report and beta HCG is currently pending. EXAM: TRANSABDOMINAL AND TRANSVAGINAL ULTRASOUND OF PELVIS TECHNIQUE: Both transabdominal and transvaginal ultrasound examinations of the pelvis were performed. Transabdominal technique was performed for global imaging of the pelvis including uterus, ovaries, adnexal regions, and pelvic cul-de-sac. It was necessary to proceed with endovaginal exam following the transabdominal exam to visualize the endometrium and adnexa. COMPARISON:  No recent imaging is available for comparison. FINDINGS: Uterus Measurements: 12.6 x 8.0 x 8.5 (volume = 450 cc) cm. = volume: 450 mL. Heterogeneous uterus with thickened endometrium. Cystic area in the endometrial canal that measures 4.5 x 2.6 x 4.4 cm. This cystic characteristics and central increased echogenicity. Central increased echogenicity the without  discrete finding that would suggest embryo with very limited assessment due to patient discomfort and clinical condition. Endometrium Thickness: 3.3 cm. Thickened with cystic area in the fundus of the uterus. Right ovary Measurements: 3.2 x 2.7 x 2.1 (volume = 9.5 cc) cm = volume: 9.5 mL. Normal appearance/no adnexal mass. Left ovary Not visible, in the area of the LEFT adnexa is heterogeneous material that has the appearance of blood products but conforms to the cul-de-sac and LEFT adnexa. Other findings Moderate heterogeneous fluid in the LEFT adnexa primarily but also tracking along the RIGHT uterus. IMPRESSION: Unusual cystic area in the endometrial canal. If this patient has any history of recent positive pregnancy test this could represent a failed gestation with retained products of conception. Pseudo gestational sac with areas of internal hemorrhage in the setting of ruptured ectopic pregnancy is also considered. Heterogeneous appearance of material in the cul-de-sac could also potentially represent sequela of pyogenic infection though this is lower on the differential at this time based on appearance. If the patient could tolerate further evaluation with cross-sectional imaging CT could potentially be helpful. Ultimately gyn consultation is suggested. Critical Value/emergent results were called by telephone at the time of interpretation on 02/04/2022 at 11:01 am to provider [facility] , who verbally acknowledged these results. Electronically Signed: By: Zetta Bills M.D. On: 02/04/2022 11:01   DG Chest Port 1 View  Result Date: 02/04/2022 CLINICAL DATA:  31 year old female with history of lower abdominal pain and vaginal bleeding 3 days ago. Fever. Low blood pressure. EXAM: PORTABLE CHEST 1 VIEW COMPARISON:  Chest x-ray 08/09/2021. FINDINGS: Lung volumes are slightly low. No consolidative airspace disease. No pleural effusions. No pneumothorax. No evidence of pulmonary edema. Heart size is normal.  The patient is rotated to the right on today's exam, resulting in distortion of the mediastinal contours  and reduced diagnostic sensitivity and specificity for mediastinal pathology. IMPRESSION: 1. No radiographic evidence of acute cardiopulmonary disease. Electronically Signed   By: Trudie Reed M.D.   On: 02/04/2022 10:55    Scheduled Meds:  Chlorhexidine Gluconate Cloth  6 each Topical Daily   doxycycline  100 mg Oral Q12H   enoxaparin (LOVENOX) injection  40 mg Subcutaneous Q24H   feeding supplement  237 mL Oral BID BM   feeding supplement (PROSource TF)  45 mL Per Tube BID   gabapentin  300 mg Oral TID    HYDROmorphone (DILAUDID) injection  1 mg Intravenous Once   ketorolac  15 mg Intravenous Q6H   lidocaine  1 patch Transdermal Daily   linezolid  600 mg Oral Q12H   metroNIDAZOLE  500 mg Oral Q12H   multivitamin  15 mL Per Tube QHS   nicotine  21 mg Transdermal Daily   oxyCODONE  20 mg Oral Q12H   polyethylene glycol  17 g Per NG tube BID   senna-docusate  1 tablet Oral QHS   sodium chloride flush  10-40 mL Intracatheter Q12H   traZODone  50 mg Per NG tube QHS   Continuous Infusions:  cefTRIAXone (ROCEPHIN)  IV 2 g (02/20/22 2118)   feeding supplement (OSMOLITE 1.5 CAL) 1,000 mL (02/20/22 2353)   ferric gluconate (FERRLECIT) IVPB 250 mg (02/20/22 1134)   methocarbamol (ROBAXIN) IV     potassium chloride 10 mEq (02/21/22 0819)   PRN Meds:bisacodyl, diphenhydrAMINE **OR** diphenhydrAMINE, HYDROmorphone (DILAUDID) injection, hydrOXYzine, lip balm, menthol-cetylpyridinium, ondansetron **OR** ondansetron (ZOFRAN) IV, phenol, polyvinyl alcohol, prochlorperazine, simethicone, sodium chloride flush    LOS: 12 days   Reva Bores, MD 02/21/2022 9:16 AM

## 2022-02-21 NOTE — Progress Notes (Addendum)
Paged by RN regarding patient pain and patient threatening to leave if she didn't receive dose of dilaudid.   Patient seen and evaluated at bedside. She reports no significant worsening of pain, but that toradol has not been able to control it, and that only the dilaudid has helped her pain. She states she does not intend on actually leaving AMA. Also reports some nausea but no vomiting.  Physical exam: Patient resting in bed. Appears somewhat uncomfortable.  Tachycardic on exam to 120's Bibasilar crackles on lung exam.  Abd soft, TTP, positive bowel sounds.  Assessment/plan: Pelvic abscess with ongoing pain - Patient is mildly tachycardic but with stable blood pressure. Does appear to be uncomfortable. No concerning changes on abd exam. Will not obtain KUB at this time. - will give 15mg  toradol IV. If pain remains uncontrolled, can consider additional dose of dilaudid.   ADDENDUM: Paged by RN for worsening oxygen requirements, satting in 70-80's on RA with improvement on NRB, low grade fever to 100.6, and tachycardia up to 120.  Patient had refused flagyl and linezolid earlier today per RN.  Patient reported choking on medicines earlier today. States she feels febrile. Endorses new cough starting earlier tonight.   Evaluated patient at bedside.  Resting comfortably. Tachycardic, no murmurs. Lungs with bibasilar crackles. Breathing comfortably on 5L NRB.  Abd distended, diffuse TTP, positive bowel sounds.  Assessment/Plan: Pelvic abscesses with ongoing abd pain - Patients abdominal exam remains unchanged from prior. No concern for perforation at this time.   MRSA bacturia - new fever and tachycardia in the setting of linezolid refusal.  - discussed with patient at bedside, she states she will accept abx. Will check lactic, cbc, and blood cultures.   Acute hypoxic respiratory  - increasing oxygen requirement, noted to be 70-80s on RA per RN. Placed on NRB 5L with improvement to 95%.  She has known atelectasis and pleural effusions, however given patients new fever and acute resp status change, will check CXR. If negative, can consider CTA to r/o PE.   ADDENDUM: CXR showed retrocardiac density, atelectasis, effusions, and mild congestion, but otherwise stable compared to prior. CTA ordered to r/o PE.  ADDENDUM: CTA negative for PE. Showing atelectasis and effusions. Patient unlikely to benefit significantly from additional lasix at this time.

## 2022-02-21 NOTE — Progress Notes (Signed)
Infectious Disease Progress Note:  MRI spine done today with no findings of spinal infection.   Please see Dr Orlando Penner note from 02/19/22 regarding final antibiotic recommendations given the negative MRI findings.   Will sign off, but please call as needed.  Patient has follow up at [facility] with Dr Renold Don on 03/02/22 at 3:45pm.   Vedia Coffer for Infectious Disease [facility] 02/21/2022, 12:15 PM

## 2022-02-21 NOTE — Progress Notes (Signed)
PT Cancellation Note  Patient Details Name: Paula Lucas MRN: [id number] DOB: 02/13/1991   Cancelled Treatment:    Reason Eval/Treat Not Completed: Other (comment). Attempted x2, pt off floor at MRI earlier this AM. Pt then declined stating "I can't do anything today. I told them I would do it Monday." Pt's RR 39 in bed, HR 122bpm resting. Acute PT to return as able to progress mobility.  Lewis Shock, PT, DPT Acute Rehabilitation Services Secure chat preferred Office #: [phone]    Iona Hansen 02/21/2022, 12:50 PM

## 2022-02-21 NOTE — Progress Notes (Signed)
HD#12 Subjective:  Paula Lucas is a 31 year old female with a past medical history of recent admission for tubo-ovarian abscess complicated by septic shock (02/04/22 - 02/07/22) with previous history of tubo-ovarian abscess 2/2 N. Gonorrhea in 2019 who presents to the ED with c/o nausea, vomiting and abdominal pain, currently admitted for multiple pelvic abscesses s/p wash out with ex lap and JP drains placement.    NAEO. This morning, patient continues to desat to mid 80s when Venturi mask removed though does not appear to be in respiratory distress. Patient transport was taking her to imaging when we spoke with her this morning. Of note, patient reports having BM last night.   Objective:  Vital signs in last 24 hours: Vitals:   02/20/22 2100 02/20/22 2342 02/21/22 0358 02/21/22 0500  BP:  (!) 125/92 (!) 144/75   Pulse: 98 91 87   Resp: 20 20 18    Temp:  97.8 F (36.6 C) 97.8 F (36.6 C)   TempSrc:  Oral Oral   SpO2: 94% 93% 96%   Weight:    83.6 kg  Height:       Supplemental O2: Venturi Mask SpO2: 96 % O2 Flow Rate (L/min): 8 L/min   Physical Exam:  Cardiovascular: regular rate and rhythm Pulmonary/Chest: Mild bibasilar crackles.  Filed Weights   02/09/22 0625 02/16/22 1003 02/21/22 0500  Weight: 76.9 kg 76.9 kg 83.6 kg     Intake/Output Summary (Last 24 hours) at 02/21/2022 0729 Last data filed at 02/21/2022 0000 Gross per 24 hour  Intake 1066 ml  Output 1600 ml  Net -534 ml   Net IO Since Admission: 5,967.94 mL [02/21/22 0729]  Pertinent Labs:    Latest Ref Rng & Units 02/21/2022    4:13 AM 02/20/2022    4:20 AM 02/19/2022    4:40 AM  CBC  WBC 4.0 - 10.5 K/uL 8.2  9.9  9.7   Hemoglobin 12.0 - 15.0 g/dL 8.1  8.0  8.2   Hematocrit 36.0 - 46.0 % 25.9  25.3  25.3   Platelets 150 - 400 K/uL 519  583  611        Latest Ref Rng & Units 02/21/2022    4:13 AM 02/20/2022    4:20 AM 02/19/2022    4:40 AM  CMP  Glucose 70 - 99 mg/dL 02/21/2022  99  90   BUN 6 - 20 mg/dL 7  6   6    Creatinine 0.44 - 1.00 mg/dL 196   2.22   Sodium 135 - 145 mmol/L 139  136  140   Potassium 3.5 - 5.1 mmol/L 3.3  3.4  3.8   Chloride 98 - 111 mmol/L 100  102  100   CO2 22 - 32 mmol/L 32  30  29   Calcium 8.9 - 10.3 mg/dL 8.3  8.2  8.5     Imaging: DG CHEST PORT 1 VIEW  Result Date: 02/20/2022 CLINICAL DATA:  Shortness of breath EXAM: PORTABLE CHEST - 1 VIEW COMPARISON:  02/18/2022 FINDINGS: Unchanged mild cardiomegaly and mild pulmonary vascular congestion. Bibasilar opacities likely combination of small pleural effusions with adjacent atelectasis. Feeding tube terminates in the left upper quadrant. Right upper extremity PICC terminates at the cavoatrial junction. IMPRESSION: 1. Mild cardiomegaly. 2. Mild pulmonary vascular congestion. 3. Small bilateral pleural effusions with adjacent atelectasis. Electronically Signed   By: 04/23/2022 M.D.   On: 02/20/2022 09:30    Assessment/Plan:   Principal Problem:   Pelvic  abscess  Active Problems:   Sepsis (HCC)   MRSA (methicillin resistant staph aureus) culture positive   Hepatic abscess   Hx of Fitz-Hugh-Curtis syndrome   Protein-calorie malnutrition (HCC)   Patient Summary: Paula Lucas is a 31 y.o. with past medical history of recent admission for tubo-ovarian abscess complicated by septic shock (02/04/22 - 02/07/22) with previous history of tubo-ovarian abscess 2/2 N. Gonorrhea in 2019 who presented to the ED with c/o nausea, vomiting and abdominal pain, currently admitted for multiple pelvic abscesses s/p wash out with ex lap and JP drains placement.   Multiple pelvic abscesses Tubo-ovarian abscesses POD 6 status post ex lap with washout and bowel adhesion lysis. Appears to be clinically improving, although pain control has been difficult. Okay to receive extra dose of toradol for pain control overnight if needed. Remains afebrile with no leukocytosis today. -pain control with oxycontin BID, prn dilaudid, q6  toradol -continue doxycycline for 2 weeks post-op -continue rocephin and flagyl for 3 weeks post-op -ID following, appreciate recs -appreciate OBGYN and surgery recs -continue working with PT/OT -cortrak in place for nutrition, transitioned most meds to per tube -miralax, senna-s qhs, and dulcolax suppository prn   Hypoxia Patient was transitioned to venturi mask due to hypoxia to low 80s. She is still requiring oxygen through venturi mask today (unable to use Dukes given cortrak in place). Continues to have bibasilar crackles on exam. MRI again demonstrates bilateral pleural effusions and adjacent atelectasis seen on recent CXR. Volume down 0.5L with 40 lasix yesterday (1.6 urine output). -IV lasix 40 mg again today -continue supplemental O2 prn -lovenox ppx -if worsening hypoxia, consider repeat CXR to check for aspiration pneumonitis/PNA >> if CXR normal and hypoxia worsening, consider CT PE (low concern for PE at this time)   Occult MRSA bacteremia In the setting of MRSA bacteriuria from last admission.  Blood culture 6/19 was negative to date.  Lumbar MRI done this admission was negative for osteo or abscess.  -Thoracic MRI 02/21/22 showed no evidence of thoracic spinal infection. 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  We will continue to encourage patient daily.  Chaplain visit is also helping.  She will also need a lot of support from her family as well. -Duloxetine d/c'd as patient could not tolerate. F/u outpatient.  Back Pain Patient endorses low back pain. -MRI to r/o MRSA related spinal infection. Anxious about imaging. Has script for [facility] for MRI given claustrophobia  -Results 02/21/22 show not evidence of thoracic spinal  infection.  Hypokalemia K+ 3.3 this morning. -Will replete with 5 runs of K.   Diet: Regular. Cortrak in place. DVT: Lovenox IVF: N/A CODE STATUS full Cardiac monitor: N/A  Adron Bene MD Internal Medicine Resident PGY-1 Please contact the on call pager after 5 pm and on weekends at [phone].

## 2022-02-22 ENCOUNTER — Inpatient Hospital Stay (HOSPITAL_COMMUNITY): Payer: Medicaid Other

## 2022-02-22 DIAGNOSIS — R8271 Bacteriuria: Secondary | ICD-10-CM

## 2022-02-22 DIAGNOSIS — A4902 Methicillin resistant Staphylococcus aureus infection, unspecified site: Secondary | ICD-10-CM

## 2022-02-22 DIAGNOSIS — N739 Female pelvic inflammatory disease, unspecified: Secondary | ICD-10-CM

## 2022-02-22 LAB — CBC
HCT: 24.7 % — ABNORMAL LOW (ref 36.0–46.0)
HCT: 25.4 % — ABNORMAL LOW (ref 36.0–46.0)
Hemoglobin: 8 g/dL — ABNORMAL LOW (ref 12.0–15.0)
Hemoglobin: 7.7 g/dL — ABNORMAL LOW (ref 12.0–15.0)
MCH: 29.2 pg (ref 26.0–34.0)
MCH: 27.8 pg (ref 26.0–34.0)
MCHC: 32.4 g/dL (ref 30.0–36.0)
MCHC: 30.3 g/dL (ref 30.0–36.0)
MCV: 90.1 fL (ref 80.0–100.0)
MCV: 91.7 fL (ref 80.0–100.0)
Platelets: 501 10*3/uL — ABNORMAL HIGH (ref 150–400)
Platelets: 528 10*3/uL — ABNORMAL HIGH (ref 150–400)
RBC: 2.74 MIL/uL — ABNORMAL LOW (ref 3.87–5.11)
RBC: 2.77 MIL/uL — ABNORMAL LOW (ref 3.87–5.11)
RDW: 18.1 % — ABNORMAL HIGH (ref 11.5–15.5)
RDW: 18.3 % — ABNORMAL HIGH (ref 11.5–15.5)
WBC: 10.3 10*3/uL (ref 4.0–10.5)
WBC: 9.9 10*3/uL (ref 4.0–10.5)
nRBC: 0.2 % (ref 0.0–0.2)
nRBC: 0 % (ref 0.0–0.2)

## 2022-02-22 LAB — BASIC METABOLIC PANEL
Anion gap: 6 (ref 5–15)
BUN: 7 mg/dL (ref 6–20)
CO2: 29 mmol/L (ref 22–32)
Calcium: 8.1 mg/dL — ABNORMAL LOW (ref 8.9–10.3)
Chloride: 102 mmol/L (ref 98–111)
Creatinine, Ser: 0.75 mg/dL (ref 0.44–1.00)
GFR, Estimated: >60 mL/min (ref >60)
Glucose, Bld: 114 mg/dL — ABNORMAL HIGH (ref 70–99)
Potassium: 3.5 mmol/L (ref 3.5–5.1)
Sodium: 137 mmol/L (ref 135–145)

## 2022-02-22 LAB — GLUCOSE, CAPILLARY
Glucose-Capillary: 129 mg/dL — ABNORMAL HIGH (ref 70–99)
Glucose-Capillary: 123 mg/dL — ABNORMAL HIGH (ref 70–99)
Glucose-Capillary: 133 mg/dL — ABNORMAL HIGH (ref 70–99)
Glucose-Capillary: 83 mg/dL (ref 70–99)
Glucose-Capillary: 101 mg/dL — ABNORMAL HIGH (ref 70–99)
Glucose-Capillary: 89 mg/dL (ref 70–99)

## 2022-02-22 LAB — LACTIC ACID, PLASMA: Lactic Acid, Venous: 1.2 mmol/L (ref 0.5–1.9)

## 2022-02-22 MED ORDER — FUROSEMIDE 10 MG/ML IJ SOLN
40.0000 mg | Freq: Two times a day (BID) | INTRAMUSCULAR | Status: AC
  Administered 2022-02-22 (×2): 40 mg via INTRAVENOUS
  Filled 2022-02-22 (×2): qty 4

## 2022-02-22 MED ORDER — HYDROMORPHONE HCL 1 MG/ML IJ SOLN: 0.5000 mg | Freq: Once | INTRAMUSCULAR | Status: AC

## 2022-02-22 MED ORDER — KETOROLAC TROMETHAMINE 15 MG/ML IJ SOLN
30.0000 mg | Freq: Four times a day (QID) | INTRAMUSCULAR | Status: DC
  Administered 2022-02-22: 30 mg via INTRAVENOUS
  Filled 2022-02-22 (×2): qty 2

## 2022-02-22 MED ORDER — KETOROLAC TROMETHAMINE 15 MG/ML IJ SOLN
30.0000 mg | Freq: Four times a day (QID) | INTRAMUSCULAR | Status: DC
Start: 2022-02-22 — End: 2022-02-22

## 2022-02-22 MED ORDER — METHOCARBAMOL 500 MG PO TABS
1000.0000 mg | ORAL_TABLET | Freq: Four times a day (QID) | ORAL | Status: DC
  Administered 2022-02-22 – 2022-02-25 (×7): 1000 mg via ORAL
  Filled 2022-02-22 (×11): qty 2

## 2022-02-22 MED ORDER — IOHEXOL 350 MG/ML SOLN
69.0000 mL | Freq: Once | INTRAVENOUS | Status: AC | PRN
  Administered 2022-02-22: 69 mL via INTRAVENOUS

## 2022-02-22 MED ORDER — POTASSIUM CHLORIDE 10 MEQ/100ML IV SOLN
10.0000 meq | INTRAVENOUS | Status: AC
  Administered 2022-02-22 (×4): 10 meq via INTRAVENOUS
  Filled 2022-02-22 (×4): qty 100

## 2022-02-22 MED ORDER — KETOROLAC TROMETHAMINE 10 MG PO TABS
10.0000 mg | ORAL_TABLET | Freq: Four times a day (QID) | ORAL | Status: DC
  Administered 2022-02-22 – 2022-02-24 (×7): 10 mg via ORAL
  Filled 2022-02-22 (×9): qty 1

## 2022-02-22 MED ORDER — HYDROMORPHONE HCL 1 MG/ML IJ SOLN
0.5000 mg | Freq: Four times a day (QID) | INTRAMUSCULAR | Status: DC | PRN
  Administered 2022-02-22 – 2022-02-23 (×3): 0.5 mg via INTRAVENOUS
  Filled 2022-02-22 (×3): qty 0.5

## 2022-02-22 NOTE — Progress Notes (Signed)
   02/22/22 1255  Assess: MEWS Score  BP 124/85  MAP (mmHg) 111  Pulse Rate (!) 114  ECG Heart Rate (!) 114  Resp (!) 30  Assess: MEWS Score  MEWS Temp 0  MEWS Systolic 0  MEWS Pulse 2  MEWS RR 2  MEWS LOC 0  MEWS Score 4  MEWS Score Color Red  Assess: if the MEWS score is Yellow or Red  Were vital signs taken at a resting state? Yes  Focused Assessment No change from prior assessment  Does the patient meet 2 or more of the SIRS criteria? Yes  Does the patient have a confirmed or suspected source of infection? Yes  Provider and Rapid Response Notified? Yes  Treat  Pain Scale 0-10  Pain Score 8  Pain Type Surgical pain  Pain Location Abdomen  Take Vital Signs  Increase Vital Sign Frequency  Red: Q 1hr X 4 then Q 4hr X 4, if remains red, continue Q 4hrs  Escalate  MEWS: Escalate Red: discuss with charge nurse/RN and provider, consider discussing with RRT  Notify: Charge Nurse/RN  Name of Charge Nurse/RN Notified John B RN  Date Charge Nurse/RN Notified 02/22/22  Time Charge Nurse/RN Notified 1308  Notify: Provider  Provider Name/Title MD Rico Ala  Date Provider Notified 02/22/22  Time Provider Notified 1310  Method of Notification Page  Notification Reason Other (Comment) (increase HR and RR)  Provider response No new orders  Date of Provider Response 02/22/22  Time of Provider Response 1313  Document  Patient Outcome Stabilized after interventions  Assess: SIRS CRITERIA  SIRS Temperature  0  SIRS Pulse 1  SIRS Respirations  1  SIRS WBC 0  SIRS Score Sum  2

## 2022-02-22 NOTE — Progress Notes (Addendum)
Patient ID: Paula Lucas, female   DOB: Sep 19, 1990, 31 y.o.   MRN: [id number]   Assessment/Plan: Principal Problem:   Pelvic abscess  Active Problems:   Sepsis (HCC)   MRSA (methicillin resistant staph aureus) culture positive   Hepatic abscess   Hx of Fitz-Hugh-Curtis syndrome   Protein-calorie malnutrition (HCC)  Pelvic abscess/ Hepatic abscess Patient has multiple abscesses in her abdomen and pelvis related to prior PID with current GC and Chlamydia negative. Patient now s/p ex lap with pelvic and abdominal wash out, POD #5. Has drains in place that are draining serosanguineous material only (approximately 50 cc yesterday). Cultures from surgery are sterile. Her WBC is in the normal range.  She is afebrile.  Reports mild improvement in her abdominal pain. Positive flatus.    Continue Vancomycin, Rocephin, Doxycycline, Flagyl--appreciate ID input   Pain medications as needed   W-->D dressing changes ordered- > wound to be reassessed for potential NPWT placement this week, WOC consulted today.   Increase ambulation     If further surgery is planned, would need GYN ONC consult.   Severe protein calorie malnutrition She is now status post core track placement with tube feeds ongoing.  She does note some nausea associated with this.  Would strongly consider advancing her diet.   Sepsis (HCC) is not currently actively septic . MRI 02/22/22 negative for thoracic spine infection.   MRSA (methicillin resistant staph aureus) culture positive On vancomycin   Hypokalemia Replete and trend   Iron deficiency anemia No active bleeding, s/p Venofer   Continued shortness of breath CT angio chest negative for PR, but will bilateral pleural effusions, and near complete atelectasis if left lower lobe X-ray findings consistent with fluid overload.   On lasix as per primary team, will continue this as prescribed  Appreciate care of Paula Lucas by her primary team Will continue to  follow along with daily rounding on patient Please call [phone] [facility] Consult Attending Monday-Friday 8am - 5pm) or [phone] [facility] Attending On Call all day, every day) for any gynecologic concerns at any time.  Thank you for involving [place] in the care of this patient.  Jaynie Collins, MD, FACOG Obstetrician & Gynecologist, [facility] for [employer], [facility]  Subjective: Interval History:Still with some SOB and feels her lung is collapsed.  Mild abdominal pain.   Objective: Vital signs in last 24 hours: Temp:  [98.2 F (36.8 C)-100.6 F (38.1 C)] 98.7 F (37.1 C) (07/03 0818) Pulse Rate:  [98-133] 114 (07/03 1255) Resp:  [17-36] 30 (07/03 1255) BP: (99-126)/(69-98) 113/83 (07/03 1407) SpO2:  [87 %-99 %] 92 % (07/03 1200) Weight:  [81.8 kg] 81.8 kg (07/03 0429)  Intake/Output from previous day: 07/02 0701 - 07/03 0700 In: 2036  Out: 350 [Urine:350] Intake/Output this shift: Total I/O In: 10 [Other:10] Out: 50 [Drains:50]  Physical exam General appearance: alert, cooperative, and appears stated age Head: Normocephalic, without obvious abnormality, atraumatic Lungs: Normal effort Heart: regular rate, regular rhythm Abdomen: less distended, diffusely tender  Incision: Dressing is dry and intact.  2 JP drains present with serosanguineous fluid. Extremities: Homans sign is negative, no sign of DVT Skin: Skin color, texture, turgor normal. No rashes or lesions Neurologic: Grossly normal  Results for orders placed or performed during the hospital encounter of 02/08/22 (from the past 24 hour(s))  Glucose, capillary     Status: None   Collection Time: 02/21/22  8:31 PM  Result Value Ref  Range   Glucose-Capillary 82 70 - 99 mg/dL  Glucose, capillary     Status: Abnormal   Collection Time: 02/21/22 11:51 PM  Result Value Ref Range   Glucose-Capillary 118 (H) 70 - 99 mg/dL  Lactic acid, plasma     Status: None    Collection Time: 02/22/22 12:53 AM  Result Value Ref Range   Lactic Acid, Venous 1.2 0.5 - 1.9 mmol/L  CBC     Status: Abnormal   Collection Time: 02/22/22  1:01 AM  Result Value Ref Range   WBC 10.3 4.0 - 10.5 K/uL   RBC 2.74 (L) 3.87 - 5.11 MIL/uL   Hemoglobin 8.0 (L) 12.0 - 15.0 g/dL   HCT 30.1 (L) 60.1 - 09.3 %   MCV 90.1 80.0 - 100.0 fL   MCH 29.2 26.0 - 34.0 pg   MCHC 32.4 30.0 - 36.0 g/dL   RDW 23.5 (H) 57.3 - 22.0 %   Platelets 501 (H) 150 - 400 K/uL   nRBC 0.2 0.0 - 0.2 %  Basic metabolic panel     Status: Abnormal   Collection Time: 02/22/22  2:55 AM  Result Value Ref Range   Sodium 137 135 - 145 mmol/L   Potassium 3.5 3.5 - 5.1 mmol/L   Chloride 102 98 - 111 mmol/L   CO2 29 22 - 32 mmol/L   Glucose, Bld 114 (H) 70 - 99 mg/dL   BUN 7 6 - 20 mg/dL   Creatinine, Ser 2.54 0.44 - 1.00 mg/dL   Calcium 8.1 (L) 8.9 - 10.3 mg/dL   GFR, Estimated >27 >06 mL/min   Anion gap 6 5 - 15  CBC     Status: Abnormal   Collection Time: 02/22/22  2:55 AM  Result Value Ref Range   WBC 9.9 4.0 - 10.5 K/uL   RBC 2.77 (L) 3.87 - 5.11 MIL/uL   Hemoglobin 7.7 (L) 12.0 - 15.0 g/dL   HCT 23.7 (L) 62.8 - 31.5 %   MCV 91.7 80.0 - 100.0 fL   MCH 27.8 26.0 - 34.0 pg   MCHC 30.3 30.0 - 36.0 g/dL   RDW 17.6 (H) 16.0 - 73.7 %   Platelets 528 (H) 150 - 400 K/uL   nRBC 0.0 0.0 - 0.2 %  Glucose, capillary     Status: Abnormal   Collection Time: 02/22/22  4:12 AM  Result Value Ref Range   Glucose-Capillary 129 (H) 70 - 99 mg/dL  Glucose, capillary     Status: Abnormal   Collection Time: 02/22/22  8:17 AM  Result Value Ref Range   Glucose-Capillary 123 (H) 70 - 99 mg/dL  Glucose, capillary     Status: Abnormal   Collection Time: 02/22/22 11:50 AM  Result Value Ref Range   Glucose-Capillary 133 (H) 70 - 99 mg/dL    Studies/Results: CT Angio Chest Pulmonary Embolism (PE) W or WO Contrast  Result Date: 02/22/2022 CLINICAL DATA:  Pulmonary embolism (PE) suspected, high prob Acute hypoxic  respiratory failure, postsurgical. EXAM: CT ANGIOGRAPHY CHEST WITH CONTRAST TECHNIQUE: Multidetector CT imaging of the chest was performed using the standard protocol during bolus administration of intravenous contrast. Multiplanar CT image reconstructions and MIPs were obtained to evaluate the vascular anatomy. RADIATION DOSE REDUCTION: This exam was performed according to the departmental dose-optimization program which includes automated exposure control, adjustment of the mA and/or kV according to patient size and/or use of iterative reconstruction technique. CONTRAST:  55mL OMNIPAQUE IOHEXOL 350 MG/ML SOLN COMPARISON:  Chest radiograph yesterday.  Thoracic spine MRI yesterday. Lung bases from recent abdominal CT reviewed. FINDINGS: Cardiovascular: There are no filling defects within the pulmonary arteries to suggest pulmonary embolus. No aortic dissection or acute aortic findings. Common origin of brachiocephalic and left common carotid artery, variant arch anatomy. The heart is normal in size. Trace pericardial fluid anteriorly. Mediastinum/Nodes: Feeding tube decompresses the esophagus. No mediastinal adenopathy. No bulky hilar adenopathy. No suspicious axillary adenopathy. Lungs/Pleura: Moderate bilateral pleural effusions. There is associated compressive atelectasis. This includes near complete atelectasis of the left lower lobe. No features of pulmonary edema. The trachea and central bronchi are patent. Upper Abdomen: Small amount of perihepatic fluid adjacent to the anterior right hepatic lobe, also seen on prior abdominal CT, series 6, image 89, not significantly changed. Musculoskeletal: There are no acute or suspicious osseous abnormalities. Mild body wall edema. Review of the MIP images confirms the above findings. IMPRESSION: 1. No pulmonary embolus. 2. Moderate bilateral pleural effusions with associated compressive atelectasis. This includes near complete atelectasis of the left lower lobe. 3. Small  amount of perihepatic fluid, also seen on prior abdominal CT. This is not well assessed on the current exam due to phase of contrast. Electronically Signed   By: Narda Rutherford M.D.   On: 02/22/2022 00:50   DG CHEST PORT 1 VIEW  Result Date: 02/21/2022 CLINICAL DATA:  Dyspnea EXAM: PORTABLE CHEST 1 VIEW COMPARISON:  02/20/2022 FINDINGS: Feeding catheter is noted extending into the stomach. Right-sided PICC is noted in satisfactory position. Cardiac shadow is stable. Persistent left retrocardiac density is noted. Mild right basilar atelectasis is noted as well. Small effusions are seen. Stable vascular congestion is noted. IMPRESSION: Overall appearance of the chest is stable from the prior exam. Electronically Signed   By: Alcide Clever M.D.   On: 02/21/2022 23:12   MR THORACIC SPINE W WO CONTRAST  Result Date: 02/21/2022 CLINICAL DATA:  31 year old female. History of MRSA, PID. Pelvic and abdominal abscesses. EXAM: MRI THORACIC WITHOUT AND WITH CONTRAST TECHNIQUE: Multiplanar and multiecho pulse sequences of the thoracic spine were obtained without and with intravenous contrast. CONTRAST:  7.71mL GADAVIST GADOBUTROL 1 MMOL/ML IV SOLN COMPARISON:  Lumbar MRI 02/12/2022. CT Abdomen and Pelvis 02/14/2022. FINDINGS: Limited cervical spine imaging: Negative aside from mild straightening and reversal of cervical lordosis. Thoracic spine segmentation: Appears to be normal and concordant with the lumbar numbering last month. Alignment: Maintained thoracic vertebral height and alignment. Relatively normal kyphosis. Vertebrae: No marrow edema or evidence of acute osseous abnormality. Somewhat generalized decreased T1 marrow signal in the visible spine and ribs may reflect red marrow reactivation. Cord: No abnormal intradural enhancement or dural thickening. No intraspinal fluid collection is evident. Thoracic spinal cord signal and morphology are within normal limits. Paraspinal and other soft tissues: Bilateral  pleural effusions and compressed lung parenchyma similar to the recent CT Abdomen and Pelvis. Negative visible kidneys. Thoracic paraspinal soft tissues remain within normal limits. Thoracic epidural space is within normal limits. Disc levels: Maintained normal thoracic intervertebral disc signal and morphology throughout. There is mild bilateral facet hypertrophy at T9-T10. No thoracic spinal stenosis. No significant thoracic neural foraminal stenosis. IMPRESSION: 1. No evidence of thoracic spinal infection. Normal thoracic discs. No thoracic spinal stenosis or significant neural impingement. 2. Bilateral pleural effusions and compressed lung parenchyma similar to the recent CT Abdomen and Pelvis. Electronically Signed   By: Odessa Fleming M.D.   On: 02/21/2022 10:15   DG CHEST PORT 1 VIEW  Result Date: 02/20/2022 CLINICAL DATA:  Shortness of breath EXAM: PORTABLE CHEST - 1 VIEW COMPARISON:  02/18/2022 FINDINGS: Unchanged mild cardiomegaly and mild pulmonary vascular congestion. Bibasilar opacities likely combination of small pleural effusions with adjacent atelectasis. Feeding tube terminates in the left upper quadrant. Right upper extremity PICC terminates at the cavoatrial junction. IMPRESSION: 1. Mild cardiomegaly. 2. Mild pulmonary vascular congestion. 3. Small bilateral pleural effusions with adjacent atelectasis. Electronically Signed   By: Acquanetta Belling M.D.   On: 02/20/2022 09:30   DG Abd Portable 1V  Result Date: 02/19/2022 CLINICAL DATA:  Provided history: Encounter for feeding tube placement. EXAM: PORTABLE ABDOMEN - 1 VIEW COMPARISON:  Abdominal radiograph 02/18/2022. FINDINGS: An enteric tube passes below the level left hemidiaphragm with tip projecting in the expected location of the pylorus or first portion of the duodenum. Nonobstructive bowel gas pattern within the relies abdomen. No acute bony abnormality identified. IMPRESSION: Enteric tube present with tip projecting in the expected location of  the pylorus or first portion of the duodenum. Nonspecific, nonobstructive bowel gas pattern within the visualized abdomen. Electronically Signed   By: Jackey Loge D.O.   On: 02/19/2022 14:20   DG CHEST PORT 1 VIEW  Result Date: 02/18/2022 CLINICAL DATA:  Central line placement EXAM: PORTABLE CHEST 1 VIEW COMPARISON:  Radiograph 09/06/2021 FINDINGS: Right upper extremity PICC tip crosses the midline, across the brachiocephalic confluence, tip overlies the left brachiocephalic vein. Borderline enlarged cardiac silhouette. Bibasilar consolidations, left greater than right. Small bilateral pleural effusions. Low lung volumes. No pneumothorax. No acute osseous abnormality. IMPRESSION: Right upper extremity PICC tip courses across the brachiocephalic confluence, tip overlying the left brachiocephalic vein. Recommend repositioning. Bilateral pleural effusions with adjacent bibasilar atelectasis, left greater than right. Low lung volumes. These results will be called to the ordering clinician or representative by the Radiologist Assistant, and communication documented in the PACS or [employer]. Electronically Signed   By: Caprice Renshaw M.D.   On: 02/18/2022 18:18   [place] EKG SITE RITE  Result Date: 02/18/2022 If Site Rite image not attached, placement could not be confirmed due to current cardiac rhythm.  DG Abd Portable 1V  Result Date: 02/18/2022 CLINICAL DATA:  Abdominal distension. EXAM: PORTABLE ABDOMEN - 1 VIEW COMPARISON:  Abdominal CT February 14, 2022 FINDINGS: Drainage catheters again seen in the lower abdomen/pelvis. No significant change in the appearance of the bowel pattern. No evidence of small-bowel obstruction. IMPRESSION: Stable appearance of the bowel pattern. Electronically Signed   By: Ted Mcalpine M.D.   On: 02/18/2022 12:52   CT ABDOMEN PELVIS W CONTRAST  Result Date: 02/14/2022 CLINICAL DATA:  Follow-up intra-abdominal abscesses. EXAM: CT ABDOMEN AND PELVIS WITH CONTRAST  TECHNIQUE: Multidetector CT imaging of the abdomen and pelvis was performed using the standard protocol following bolus administration of intravenous contrast. RADIATION DOSE REDUCTION: This exam was performed according to the departmental dose-optimization program which includes automated exposure control, adjustment of the mA and/or kV according to patient size and/or use of iterative reconstruction technique. CONTRAST:  OMNIPAQUE IOHEXOL 300 MG/ML  SOLN COMPARISON:  CT scan 02/08/2022 FINDINGS: Lower chest: Persistent moderate-sized pleural effusions with overlying atelectasis. Hepatobiliary: Small residual lentiform rim enhancing abscess at the dome of the liver anteriorly measuring a maximum of 5.6 x 1.1 cm. This previously measured approximately 9.3 x 1.1 cm. Inferiorly there is a persistent perihepatic abscess. It measures approximately 9.5 x 3.1 cm and previously measured 10.1 x 3.7 cm. No worrisome hepatic lesions or intrahepatic biliary dilatation. The gallbladder is unremarkable. No common  bile duct dilatation. Pancreas: No mass, inflammation or ductal dilatation. Spleen: Normal size.  No focal lesions. Adrenals/Urinary Tract: Adrenal glands and kidneys are unremarkable and stable. The bladder is grossly normal. Stomach/Bowel: The stomach, duodenum, small bowel and colon grossly normal. Vascular/Lymphatic: The aorta and branch vessels are patent. The major venous structures are patent. Stable borderline enlarged mesenteric and retroperitoneal lymph nodes, likely reactive/inflammatory. Reproductive: The uterus and ovaries are stable. Other: Percutaneous drainage catheter in the upper right pelvis with a persistent rim enhancing abscess measuring approximately 8.8 x 3.5 cm and previously measuring 9.8 x 5.6 cm. A second drainage catheter in the lower right cul-de-sac area with a small persistent rim enhancing abscess measuring 4.1 x 2.0 cm. This previously measured 5.2 x 4.9 cm. Left-sided rounded  pelvic abscess measures 3.6 x 2.9 cm on image 71/3. This previously measured 3.5 x 2.7 cm. Persistent diffuse mesenteric and subcutaneous inflammations/edema. Musculoskeletal: No significant bony findings. IMPRESSION: 1. Persistent moderate-sized pleural effusions with overlying atelectasis. 2. Persistent but slightly smaller undrained perihepatic abscesses. 3. Two right-sided pelvic drainage catheters with persistent but smaller surrounding abscesses. 4. Left-sided undrained pelvic abscess is slightly smaller also. 5. Persistent diffuse mesenteric and subcutaneous inflammation. 6. Stable borderline enlarged mesenteric and retroperitoneal lymph nodes, likely reactive/inflammatory. Electronically Signed   By: Rudie Meyer M.D.   On: 02/14/2022 14:46   MR Lumbar Spine W Wo Contrast  Result Date: 02/13/2022 CLINICAL DATA:  Initial evaluation for back pain, history of MRSA bacteremia. EXAM: MRI LUMBAR SPINE WITHOUT AND WITH CONTRAST TECHNIQUE: Multiplanar and multiecho pulse sequences of the lumbar spine were obtained without and with intravenous contrast. CONTRAST:  7.62mL GADAVIST GADOBUTROL 1 MMOL/ML IV SOLN COMPARISON:  Prior CT from 02/08/2022. FINDINGS: Segmentation: Standard. Lowest well-formed disc space labeled the L5-S1 level. Alignment: Mild levoscoliosis with straightening of the normal lumbar lordosis. No listhesis. Vertebrae: Vertebral body height maintained without acute or chronic fracture. Bone marrow signal intensity diffusely decreased on T1 weighted sequence, nonspecific, but most commonly related to anemia, smoking or obesity. No discrete or worrisome osseous lesions. No evidence for osteomyelitis discitis or septic arthritis within the lumbar spine. Conus medullaris and cauda equina: Conus extends to the L1-2 level. Conus and cauda equina appear normal. Paraspinal and other soft tissues: Paraspinous soft tissues demonstrate no acute finding. Multi loculated collections/abscesses with  surrounding enhancement partially seen within the partially visualized pelvis, better characterized on recent CT. Remainder of the visualized visceral structures otherwise unremarkable. Disc levels: L1-2:  Unremarkable. L2-3: Small left subarticular disc protrusion mildly indents the left ventral thecal sac (series 12, image 16). Borderline mild narrowing of the left lateral recess. Central canal remains widely patent. No foraminal stenosis. L3-4:  Unremarkable. L4-5:  Unremarkable. L5-S1: Left subarticular disc protrusion extends into the left lateral recess, contacting the descending left S1 nerve root (series 12, image 34). Resultant mild left lateral recess stenosis. Central canal remains patent. No significant foraminal stenosis. IMPRESSION: 1. No MRI evidence for acute infection within the lumbar spine. 2. Left subarticular disc protrusion at L5-S1, contacting the descending left S1 nerve root in the left lateral recess. 3. Small left subarticular disc protrusion at L2-3 with borderline mild left lateral recess stenosis. 4. Multiloculated collections/abscesses within the partially visualized pelvis, better characterized on recent CT. Electronically Signed   By: Rise Mu M.D.   On: 02/13/2022 04:04   ECHOCARDIOGRAM COMPLETE  Result Date: 02/12/2022    ECHOCARDIOGRAM REPORT   Patient Name:   NALAYAH HITT Date of Exam:  02/12/2022 Medical Rec #:  [id number]        Height:       63.0 in Accession #:    [id number]       Weight:       169.5 lb Date of Birth:  April 11, 1991        BSA:          1.802 m Patient Age:    31 years         BP:           113/87 mmHg Patient Gender: F                HR:           98 bpm. Exam Location:  Inpatient Procedure: 2D Echo, Cardiac Doppler and Color Doppler Indications:   Staph aureus infection  History:       Patient has no prior history of Echocardiogram examinations.                Sepsis.  Sonographer:   Ross Ludwig RDCS (AE) Referring      TRUNG T VU Phys:  IMPRESSIONS  1. Left ventricular ejection fraction, by estimation, is 60 to 65%. The left ventricle has normal function. The left ventricle has no regional wall motion abnormalities. Left ventricular diastolic parameters were normal.  2. Right ventricular systolic function is normal. The right ventricular size is normal.  3. The mitral valve is normal in structure. Trivial mitral valve regurgitation. No evidence of mitral stenosis.  4. The aortic valve is normal in structure. Aortic valve regurgitation is not visualized. No aortic stenosis is present. FINDINGS  Left Ventricle: Left ventricular ejection fraction, by estimation, is 60 to 65%. The left ventricle has normal function. The left ventricle has no regional wall motion abnormalities. The left ventricular internal cavity size was normal in size. There is  no left ventricular hypertrophy. Left ventricular diastolic parameters were normal. Right Ventricle: The right ventricular size is normal. Right vetricular wall thickness was not well visualized. Right ventricular systolic function is normal. Left Atrium: Left atrial size was normal in size. Right Atrium: Right atrial size was normal in size. Pericardium: There is no evidence of pericardial effusion. Mitral Valve: The mitral valve is normal in structure. Trivial mitral valve regurgitation. No evidence of mitral valve stenosis. Tricuspid Valve: The tricuspid valve is normal in structure. Tricuspid valve regurgitation is trivial. Aortic Valve: The aortic valve is normal in structure. Aortic valve regurgitation is not visualized. No aortic stenosis is present. Aortic valve mean gradient measures 4.0 mmHg. Aortic valve peak gradient measures 7.1 mmHg. Aortic valve area, by VTI measures 2.25 cm. Pulmonic Valve: The pulmonic valve was normal in structure. Pulmonic valve regurgitation is not visualized. Aorta: The aortic root and ascending aorta are structurally normal, with no evidence of dilitation. IAS/Shunts:  The atrial septum is grossly normal.  LEFT VENTRICLE PLAX 2D LVIDd:         4.80 cm LVIDs:         3.40 cm LV PW:         0.90 cm LV IVS:        1.10 cm LVOT diam:     1.90 cm LV SV:         49 LV SV Index:   27 LVOT Area:     2.84 cm  RIGHT VENTRICLE             IVC RV Basal diam:  2.30 cm  IVC diam: 1.50 cm RV S prime:     12.40 cm/s TAPSE (M-mode): 2.1 cm LEFT ATRIUM             Index        RIGHT ATRIUM          Index LA diam:        3.80 cm 2.11 cm/m   RA Area:     9.13 cm LA Vol (A2C):   26.4 ml 14.65 ml/m  RA Volume:   15.80 ml 8.77 ml/m LA Vol (A4C):   29.7 ml 16.48 ml/m LA Biplane Vol: 29.3 ml 16.26 ml/m  AORTIC VALVE AV Area (Vmax):    2.24 cm AV Area (Vmean):   2.21 cm AV Area (VTI):     2.25 cm AV Vmax:           133.00 cm/s AV Vmean:          87.900 cm/s AV VTI:            0.218 m AV Peak Grad:      7.1 mmHg AV Mean Grad:      4.0 mmHg LVOT Vmax:         105.00 cm/s LVOT Vmean:        68.600 cm/s LVOT VTI:          0.173 m LVOT/AV VTI ratio: 0.79  AORTA Ao Root diam: 2.90 cm Ao Asc diam:  2.70 cm  SHUNTS Systemic VTI:  0.17 m Systemic Diam: 1.90 cm Kristeen Miss MD Electronically signed by Kristeen Miss MD Signature Date/Time: 02/12/2022/4:39:15 PM    Final    VAS [place] LOWER EXTREMITY VENOUS (DVT)  Result Date: 02/12/2022  Lower Venous DVT Study Patient Name:  SARYE KATH Emigh  Date of Exam:   02/12/2022 Medical Rec #: [id number]         Accession #:    [id number] Date of Birth: 30-Nov-1990         Patient Gender: F Patient Age:   86 years Exam Location:  [facility] Procedure:      VAS [place] LOWER EXTREMITY VENOUS (DVT) Referring Phys: JULIE MACHEN --------------------------------------------------------------------------------  Indications: Pain.  Risk Factors: None identified. Limitations: Poor patient cooperation, patient pain tolerance. Comparison Study: No prior studies. Performing Technologist: Chanda Busing RVT  Examination Guidelines: A complete evaluation includes B-mode  imaging, spectral Doppler, color Doppler, and power Doppler as needed of all accessible portions of each vessel. Bilateral testing is considered an integral part of a complete examination. Limited examinations for reoccurring indications may be performed as noted. The reflux portion of the exam is performed with the patient in reverse Trendelenburg.  +---------+---------------+---------+-----------+----------+--------------+ RIGHT    CompressibilityPhasicitySpontaneityPropertiesThrombus Aging +---------+---------------+---------+-----------+----------+--------------+ CFV      Full           Yes      Yes                                 +---------+---------------+---------+-----------+----------+--------------+ SFJ      Full                                                        +---------+---------------+---------+-----------+----------+--------------+ FV Prox  Full                                                        +---------+---------------+---------+-----------+----------+--------------+  FV Mid   Full                                                        +---------+---------------+---------+-----------+----------+--------------+ FV DistalFull                                                        +---------+---------------+---------+-----------+----------+--------------+ PFV      Full                                                        +---------+---------------+---------+-----------+----------+--------------+ POP      Full           Yes      Yes                                 +---------+---------------+---------+-----------+----------+--------------+ PTV      Full                                                        +---------+---------------+---------+-----------+----------+--------------+ PERO     Full                                                        +---------+---------------+---------+-----------+----------+--------------+    +----+---------------+---------+-----------+----------+--------------+ LEFTCompressibilityPhasicitySpontaneityPropertiesThrombus Aging +----+---------------+---------+-----------+----------+--------------+ CFV Full           Yes      Yes                                 +----+---------------+---------+-----------+----------+--------------+    Summary: RIGHT: - There is no evidence of deep vein thrombosis in the lower extremity.  - No cystic structure found in the popliteal fossa.  LEFT: - No evidence of common femoral vein obstruction.  *See table(s) above for measurements and observations. Electronically signed by Sherald Hess MD on 02/12/2022 at 11:23:51 AM.    Final    CT IMAGE GUIDED DRAINAGE BY PERCUTANEOUS CATHETER  Result Date: 02/10/2022 INDICATION: History of tubo-ovarian abscess, post CT-guided placement right lower abdominal/pelvic drainage catheter on 02/05/2022. Postprocedural CT scan performed 02/08/2022 demonstrates an unchanged fluid collection within the right lower pelvis and as such request made for placement of an additional percutaneous drainage catheter for infection source control purposes. Additionally, there is request for CT-guided exchange and/or repositioning of the pre-existing drainage catheter into the more dominant component of the residual pelvic abscess EXAM: 1. CT GUIDED RIGHT TRANS GLUTEAL APPROACH TUBO-OVARIAN ABSCESS DRAINAGE CATHETER PLACEMENT 2. CT-GUIDED EXCHANGE AND REPOSITIONING OF EXISTING RIGHT LOWER ABDOMINAL/PELVIC DRAINAGE CATHETER COMPARISON:  CT abdomen pelvis-02/08/2022;  02/04/2022 CT guided right lower abdominal/pelvic abscess drainage catheter placement-02/05/2022 MEDICATIONS: The patient is currently admitted to the hospital and receiving intravenous antibiotics. The antibiotics were administered within an appropriate time frame prior to the initiation of the procedure. ANESTHESIA/SEDATION: Moderate (conscious) sedation was employed during  this procedure as administered by the Interventional Radiology RN. A total of Versed 4 mg, Dilaudid 1 mg and Fentanyl 150 mcg was administered intravenously. Moderate Sedation Time: 60 minutes. The patient's level of consciousness and vital signs were monitored continuously by radiology nursing throughout the procedure under my direct supervision. CONTRAST:  None COMPLICATIONS: None immediate. PROCEDURE: RADIATION DOSE REDUCTION: This exam was performed according to the departmental dose-optimization program which includes automated exposure control, adjustment of the mA and/or kV according to patient size and/or use of iterative reconstruction technique. Informed written consent was obtained from the patient after a discussion of the risks, benefits and alternatives to treatment. The patient was initially placed prone on the CT gantry and a pre procedural CT was performed re-demonstrating the known abscess/fluid collection within the right hemipelvis with dominant component measuring approximately 5.5 x 5.4 cm (image 11, series 2). The procedure was planned. A timeout was performed prior to the initiation of the procedure. The skin overlying the right buttocks was prepped and draped in the usual sterile fashion. The overlying soft tissues were anesthetized with 1% lidocaine with epinephrine. Appropriate trajectory was planned with the use of a 22 gauge spinal needle. An 18 gauge trocar needle was advanced into the abscess/fluid collection and a short Amplatz super stiff wire was coiled within the collection. Appropriate positioning was confirmed with a limited CT scan. The tract was serially dilated allowing placement of a 10 [place] all-purpose drainage catheter. Appropriate positioning was confirmed with a limited postprocedural CT scan. Approximately 65 ml of purulent appearing serous fluid was aspirated. The tube was connected to a drainage bag and secured in place within interrupted suture and a Stat Lock  device. A dressing was applied. The patient was then repositioned supine on the CT gantry and noncontrast images were obtained of the lower pelvis and pre-existing right anterior approach lower abdominal/pelvic drainage catheter. Attempts were made to retract the existing drainage catheter into the dominant component of the residual collection however this proved uncomfortable for the patient. As such, the external portion of the drainage catheter was prepped and draped in the usual sterile fashion. The external portion of the drainage catheter was cut and cannulated with a short Amplatz wire. Existing 12 French drainage catheter was then exchanged for a new 12 French percutaneous catheter which under intermittent CT guidance was repositioned into the dominant residual component of the right lower quadrant collection (series 11). The drainage catheter was connected to a gravity bag and secured in place within interrupted suture and a Stat Lock device. Patient tolerated the above procedures well (though did require a generous amount of conscious sedation medications) without immediate postprocedural complication. IMPRESSION: 1. Successful CT guided placement of a 10 [place] all purpose drain catheter into the undrained pelvic abscess via right trans gluteal approach with aspiration of 65 mL of purulent fluid. Samples were sent to the laboratory as requested by the ordering clinical team. 2. Successful CT guided exchange and repositioning of pre-existing 12 French right anterior abdominal/pelvic drainage catheter with end now coiled and locked within the residual anteriorly located abscess cavity. Electronically Signed   By: Simonne Come M.D.   On: 02/10/2022 16:33   CT ABSCESS CATH EXCHANGE  Result  Date: 02/10/2022 INDICATION: History of tubo-ovarian abscess, post CT-guided placement right lower abdominal/pelvic drainage catheter on 02/05/2022. Postprocedural CT scan performed 02/08/2022 demonstrates an unchanged  fluid collection within the right lower pelvis and as such request made for placement of an additional percutaneous drainage catheter for infection source control purposes. Additionally, there is request for CT-guided exchange and/or repositioning of the pre-existing drainage catheter into the more dominant component of the residual pelvic abscess EXAM: 1. CT GUIDED RIGHT TRANS GLUTEAL APPROACH TUBO-OVARIAN ABSCESS DRAINAGE CATHETER PLACEMENT 2. CT-GUIDED EXCHANGE AND REPOSITIONING OF EXISTING RIGHT LOWER ABDOMINAL/PELVIC DRAINAGE CATHETER COMPARISON:  CT abdomen pelvis-02/08/2022; 02/04/2022 CT guided right lower abdominal/pelvic abscess drainage catheter placement-02/05/2022 MEDICATIONS: The patient is currently admitted to the hospital and receiving intravenous antibiotics. The antibiotics were administered within an appropriate time frame prior to the initiation of the procedure. ANESTHESIA/SEDATION: Moderate (conscious) sedation was employed during this procedure as administered by the Interventional Radiology RN. A total of Versed 4 mg, Dilaudid 1 mg and Fentanyl 150 mcg was administered intravenously. Moderate Sedation Time: 60 minutes. The patient's level of consciousness and vital signs were monitored continuously by radiology nursing throughout the procedure under my direct supervision. CONTRAST:  None COMPLICATIONS: None immediate. PROCEDURE: RADIATION DOSE REDUCTION: This exam was performed according to the departmental dose-optimization program which includes automated exposure control, adjustment of the mA and/or kV according to patient size and/or use of iterative reconstruction technique. Informed written consent was obtained from the patient after a discussion of the risks, benefits and alternatives to treatment. The patient was initially placed prone on the CT gantry and a pre procedural CT was performed re-demonstrating the known abscess/fluid collection within the right hemipelvis with dominant  component measuring approximately 5.5 x 5.4 cm (image 11, series 2). The procedure was planned. A timeout was performed prior to the initiation of the procedure. The skin overlying the right buttocks was prepped and draped in the usual sterile fashion. The overlying soft tissues were anesthetized with 1% lidocaine with epinephrine. Appropriate trajectory was planned with the use of a 22 gauge spinal needle. An 18 gauge trocar needle was advanced into the abscess/fluid collection and a short Amplatz super stiff wire was coiled within the collection. Appropriate positioning was confirmed with a limited CT scan. The tract was serially dilated allowing placement of a 10 [place] all-purpose drainage catheter. Appropriate positioning was confirmed with a limited postprocedural CT scan. Approximately 65 ml of purulent appearing serous fluid was aspirated. The tube was connected to a drainage bag and secured in place within interrupted suture and a Stat Lock device. A dressing was applied. The patient was then repositioned supine on the CT gantry and noncontrast images were obtained of the lower pelvis and pre-existing right anterior approach lower abdominal/pelvic drainage catheter. Attempts were made to retract the existing drainage catheter into the dominant component of the residual collection however this proved uncomfortable for the patient. As such, the external portion of the drainage catheter was prepped and draped in the usual sterile fashion. The external portion of the drainage catheter was cut and cannulated with a short Amplatz wire. Existing 12 French drainage catheter was then exchanged for a new 12 French percutaneous catheter which under intermittent CT guidance was repositioned into the dominant residual component of the right lower quadrant collection (series 11). The drainage catheter was connected to a gravity bag and secured in place within interrupted suture and a Stat Lock device. Patient tolerated  the above procedures well (though did require a generous  amount of conscious sedation medications) without immediate postprocedural complication. IMPRESSION: 1. Successful CT guided placement of a 10 [place] all purpose drain catheter into the undrained pelvic abscess via right trans gluteal approach with aspiration of 65 mL of purulent fluid. Samples were sent to the laboratory as requested by the ordering clinical team. 2. Successful CT guided exchange and repositioning of pre-existing 12 French right anterior abdominal/pelvic drainage catheter with end now coiled and locked within the residual anteriorly located abscess cavity. Electronically Signed   By: Simonne Come M.D.   On: 02/10/2022 16:33   CT ABDOMEN PELVIS W CONTRAST  Result Date: 02/08/2022 CLINICAL DATA:  Sepsis EXAM: CT ABDOMEN AND PELVIS WITH CONTRAST TECHNIQUE: Multidetector CT imaging of the abdomen and pelvis was performed using the standard protocol following bolus administration of intravenous contrast. RADIATION DOSE REDUCTION: This exam was performed according to the departmental dose-optimization program which includes automated exposure control, adjustment of the mA and/or kV according to patient size and/or use of iterative reconstruction technique. CONTRAST:  OMNIPAQUE IOHEXOL 300 MG/ML  SOLN COMPARISON:  Previous studies including the CT done on February 08, 2022 FINDINGS: Lower chest: Small bilateral pleural effusions are seen more so on the right side. There are linear patchy infiltrates in the lingula and both lower lobes suggesting atelectasis/pneumonia. Hepatobiliary: No focal abnormality is seen in the liver. There is no dilation of bile ducts. Gallbladder is unremarkable. Small ascites is noted in the perihepatic region. Pancreas: No focal abnormality is seen. Spleen: Unremarkable. Adrenals/Urinary Tract: Adrenals are not enlarged. There is no hydronephrosis. There are no renal or ureteral stones. Urinary bladder is unremarkable.  There is 3 mm low-density in the midportion of left kidney which is too small to be characterized. Stomach/Bowel: Stomach is not distended. Small bowel loops are not dilated. Appendix is not seen. There is no significant wall thickening in the colon. There is interval placement of percutaneous drainage catheter with its tip in the right lower abdomen. There is interval decrease in size of the complex fluid collection in the right lower quadrant. There is 4 cm loculated fluid collection slightly to the left of midline at the level of pelvic inlet. There is 7.2 x 5.3 x 4.9 cm loculated fluid collection with minimally enhancing margin in the cul-de-sac in the pelvis. There are no pockets of air within these loculated fluid collections. There is stranding in the fat planes in the lower abdomen and pelvis. Vascular/Lymphatic: Vascular structures are unremarkable. Reproductive: Uterus is to the right of midline. Other: There is no pneumoperitoneum. Musculoskeletal: No acute findings are seen. IMPRESSION: Interval placement of percutaneous drainage catheter within air-fluid collection in the right lower abdomen with interval decrease in size. There is 7.2 cm loculated fluid collection in the cul-de-sac in the pelvis with slightly enhancing margins. There is another 4 cm loculated fluid collection in the left side of the pelvis. These may suggest loculated serous fluid collections or abscesses. Small ascites is noted in the perihepatic region. There is no evidence of intestinal obstruction or pneumoperitoneum. There is no hydronephrosis. Small bilateral pleural effusions. There are linear patchy infiltrates in both lower lung fields suggesting atelectasis/pneumonia. Electronically Signed   By: Ernie Avena M.D.   On: 02/08/2022 09:41   CT IMAGE GUIDED DRAINAGE BY PERCUTANEOUS CATHETER  Result Date: 02/05/2022 INDICATION: PID with large abdominopelvic fluid collection. EXAM: CT GUIDED DRAINAGE CATHETER PLACEMENT  INTO ABDOMINOPELVIC FLUID COLLECTION RADIATION DOSE REDUCTION: This exam was performed according to the departmental dose-optimization program which includes automated  exposure control, adjustment of the mA and/or kV according to patient size and/or use of iterative reconstruction technique. COMPARISON:  CT AP, 02/04/2022 MEDICATIONS: The patient is currently admitted to the hospital and receiving intravenous antibiotics. The antibiotics were administered within an appropriate time frame prior to the initiation of the procedure. ANESTHESIA/SEDATION: Moderate (conscious) sedation was employed during this procedure. A total of Versed 3 mg and Fentanyl 75 mcg was administered intravenously. Additionally, 1 mg Dilaudid was administered IV. Moderate Sedation Time: 19 minutes. The patient's level of consciousness and vital signs were monitored continuously by radiology nursing throughout the procedure under my direct supervision. CONTRAST:  None COMPLICATIONS: None immediate. PROCEDURE: Informed written consent was obtained from the patient and/or patient's representative after a discussion of the risks, benefits and alternatives to treatment. The patient was placed supine on the CT gantry and a pre procedural CT was performed re-demonstrating the known abscess/fluid collection within the lower abdomen/pelvis. The procedure was planned. A timeout was performed prior to the initiation of the procedure. The RIGHT lower quadrant was prepped and draped in the usual sterile fashion. The overlying soft tissues were anesthetized with 1% lidocaine with epinephrine. Appropriate trajectory was planned with the use of a 22 gauge spinal needle. An 18 gauge trocar needle was advanced into the abscess/fluid collection and a short Amplatz super stiff wire was coiled within the collection. Appropriate positioning was confirmed with a limited CT scan. The tract was serially dilated allowing placement of a 12 [place] all-purpose drainage  catheter. Appropriate positioning was confirmed with a limited postprocedural CT scan. 5 mL ml of serous fluid was aspirated and submitted for analysis. The tube was connected to a drainage bag and sutured in place. A dressing was placed. The patient tolerated the procedure well without immediate post procedural complication. IMPRESSION: Successful CT guided placement of a 12 Fr drainage catheter into the abdominopelvic collection with aspiration of serous fluid, as above. Samples were sent to the laboratory as requested by the ordering clinical team. Roanna Banning, MD [facility] Radiology Electronically Signed   By: Roanna Banning M.D.   On: 02/05/2022 18:08   CT ABDOMEN PELVIS WO CONTRAST  Result Date: 02/04/2022 CLINICAL DATA:  Abdominal pain, acute, nonlocalized septic shock, abnormal uterine US, concern for PID EXAM: CT ABDOMEN AND PELVIS WITHOUT CONTRAST TECHNIQUE: Multidetector CT imaging of the abdomen and pelvis was performed following the standard protocol without IV contrast. RADIATION DOSE REDUCTION: This exam was performed according to the departmental dose-optimization program which includes automated exposure control, adjustment of the mA and/or kV according to patient size and/or use of iterative reconstruction technique. COMPARISON:  Pelvic ultrasound today FINDINGS: Lower chest: Pleural thickening noted peripherally in the right lower hemithorax, possibly representing trace loculated pleural effusion. No confluent airspace opacities. Hepatobiliary: No focal hepatic abnormality. Gallbladder unremarkable. Pancreas: No focal abnormality or ductal dilatation. Spleen: No focal abnormality.  Normal size. Adrenals/Urinary Tract: No adrenal abnormality. No focal renal abnormality. No stones or hydronephrosis. Urinary bladder is unremarkable. Stomach/Bowel: Wall thickening noted in the cecum which is immediately adjacent to the large pelvic fluid collection  described below. This could reflect colitis or be secondarily inflamed. Stomach and small bowel grossly unremarkable. Vascular/Lymphatic: No evidence of aneurysm or adenopathy. Reproductive: Large complex cystic area or fluid collection seen in the pelvis extending from the cul-de-sac superiorly into the right lower quadrant adjacent to the cecum. This measures approximately 12 x 8 cm on image 64 of series 3. Internal areas  of increased density. This could reflect blood or abscess/PID. Uterus grossly unremarkable on this noncontrast study. Ovaries not definitively visualized. Other: There is free fluid adjacent to the liver and spleen. Musculoskeletal: No acute bony abnormality. IMPRESSION: Complex process in the pelvis including what appears to be a large complex fluid collection measuring 12 x 8 cm extending from the cul-de-sac superiorly to the cecum which could reflect blood products or abscess. This also could conceivably be related to and ovary. Neither ovary definitively identified. Wall thickening in the cecum could reflect colitis or secondary inflammation related to the pelvic process. Perihepatic and perisplenic ascites. Possible trace loculated right pleural effusion peripherally in the right lower hemithorax. Electronically Signed   By: Charlett Nose M.D.   On: 02/04/2022 22:29   US PELVIC COMPLETE WITH TRANSVAGINAL  Addendum Date: 02/04/2022   ADDENDUM REPORT: 02/04/2022 13:44 ADDENDUM: In light of negative quantitative beta HCG and no documented pregnancy the possibility of infectious causes for the above findings may be more likely but findings are essentially quite nonspecific at this time. Even noncontrast CT could prove beneficial to allow for assessment of pelvic fluid to determine whether this represents frank hemoperitoneum and to assess for any associated inflammatory changes that may be present not well assessed on ultrasound. These results were called by telephone at the time of  interpretation on 02/04/2022 at 1:44 pm to provider [facility] , who verbally acknowledged these results. Electronically Signed   By: Donzetta Kohut M.D.   On: 02/04/2022 13:44   Result Date: 02/04/2022 CLINICAL DATA:  A 31 year old female presents with pelvic pain and bleeding. Patient reports positive pregnancy but urine pregnancy currently negative by report and beta HCG is currently pending. EXAM: TRANSABDOMINAL AND TRANSVAGINAL ULTRASOUND OF PELVIS TECHNIQUE: Both transabdominal and transvaginal ultrasound examinations of the pelvis were performed. Transabdominal technique was performed for global imaging of the pelvis including uterus, ovaries, adnexal regions, and pelvic cul-de-sac. It was necessary to proceed with endovaginal exam following the transabdominal exam to visualize the endometrium and adnexa. COMPARISON:  No recent imaging is available for comparison. FINDINGS: Uterus Measurements: 12.6 x 8.0 x 8.5 (volume = 450 cc) cm. = volume: 450 mL. Heterogeneous uterus with thickened endometrium. Cystic area in the endometrial canal that measures 4.5 x 2.6 x 4.4 cm. This cystic characteristics and central increased echogenicity. Central increased echogenicity the without discrete finding that would suggest embryo with very limited assessment due to patient discomfort and clinical condition. Endometrium Thickness: 3.3 cm. Thickened with cystic area in the fundus of the uterus. Right ovary Measurements: 3.2 x 2.7 x 2.1 (volume = 9.5 cc) cm = volume: 9.5 mL. Normal appearance/no adnexal mass. Left ovary Not visible, in the area of the LEFT adnexa is heterogeneous material that has the appearance of blood products but conforms to the cul-de-sac and LEFT adnexa. Other findings Moderate heterogeneous fluid in the LEFT adnexa primarily but also tracking along the RIGHT uterus. IMPRESSION: Unusual cystic area in the endometrial canal. If this patient has any history of recent positive pregnancy test this could  represent a failed gestation with retained products of conception. Pseudo gestational sac with areas of internal hemorrhage in the setting of ruptured ectopic pregnancy is also considered. Heterogeneous appearance of material in the cul-de-sac could also potentially represent sequela of pyogenic infection though this is lower on the differential at this time based on appearance. If the patient could tolerate further evaluation with cross-sectional imaging CT could potentially be helpful. Ultimately gyn consultation is  suggested. Critical Value/emergent results were called by telephone at the time of interpretation on 02/04/2022 at 11:01 am to provider [facility] , who verbally acknowledged these results. Electronically Signed: By: Donzetta Kohut M.D. On: 02/04/2022 11:01   DG Chest Port 1 View  Result Date: 02/04/2022 CLINICAL DATA:  31 year old female with history of lower abdominal pain and vaginal bleeding 3 days ago. Fever. Low blood pressure. EXAM: PORTABLE CHEST 1 VIEW COMPARISON:  Chest x-ray 08/09/2021. FINDINGS: Lung volumes are slightly low. No consolidative airspace disease. No pleural effusions. No pneumothorax. No evidence of pulmonary edema. Heart size is normal. The patient is rotated to the right on today's exam, resulting in distortion of the mediastinal contours and reduced diagnostic sensitivity and specificity for mediastinal pathology. IMPRESSION: 1. No radiographic evidence of acute cardiopulmonary disease. Electronically Signed   By: Trudie Reed M.D.   On: 02/04/2022 10:55    Scheduled Meds:  Chlorhexidine Gluconate Cloth  6 each Topical Daily   doxycycline  100 mg Oral Q12H   enoxaparin (LOVENOX) injection  40 mg Subcutaneous Q24H   feeding supplement  237 mL Oral BID BM   feeding supplement (PROSource TF)  45 mL Per Tube BID   furosemide  40 mg Intravenous BID   gabapentin  300 mg Oral TID   ketorolac  30 mg Intravenous Q6H   lidocaine  1 patch Transdermal Daily    linezolid  600 mg Per Tube Q12H   metroNIDAZOLE  500 mg Per Tube Q12H   multivitamin  15 mL Per Tube QHS   nicotine  21 mg Transdermal Daily   oxyCODONE  20 mg Oral Q12H   polyethylene glycol  17 g Per NG tube BID   senna-docusate  1 tablet Oral QHS   sodium chloride flush  10-40 mL Intracatheter Q12H   traZODone  50 mg Per NG tube QHS   Continuous Infusions:  cefTRIAXone (ROCEPHIN)  IV 2 g (02/21/22 2120)   feeding supplement (OSMOLITE 1.5 CAL) 1,000 mL (02/22/22 0957)   methocarbamol (ROBAXIN) IV 1,000 mg (02/22/22 1339)   potassium chloride 10 mEq (02/22/22 1426)   PRN Meds:bisacodyl, [DISCONTINUED] diphenhydrAMINE **OR** diphenhydrAMINE, HYDROmorphone (DILAUDID) injection, hydrOXYzine, lip balm, menthol-cetylpyridinium, ondansetron **OR** ondansetron (ZOFRAN) IV, phenol, polyvinyl alcohol, prochlorperazine, simethicone, sodium chloride flush    LOS: 13 days   Jaynie Collins, MD 02/22/2022 2:59 PM

## 2022-02-22 NOTE — Progress Notes (Signed)
Patient refusing medications and threatening to leave AMA if she can not get IV pain medication at this time. Patient requested t be disconnected from IVF"s or that she will either pull the line out. IVF's turned off at this time. Patient took oxygen off and refusing to wear at this time.Patient requesting MD to come to bedside to speak with her.MD Gawaleulk notified.

## 2022-02-22 NOTE — Progress Notes (Addendum)
HD#13 Subjective:   Summary: Pt is a 31yo woman with prior tubo-ovarian abscess, admitted 6/15-6/18 with septic shock due to recurrent tubo-ovarian abscess then left AMA, who represented on 6/19 with sepsis secondary to multiple pelvic abscesses. She underwent ex lap with washout with OB & General Surgery, and has JP drains still in place.   Overnight Events: Continued oxygen requirement, 5L via mask. Also tachycardic with low grade fever. CXR negative, CTA negative, lactic 1.2. Blood cx collected.  When seen today on rounds, patient shared her frustrations with pain control but did agree to transition to oral meds, except for linezolid and flagyl which she is receiving per NGT. She reported having gotten out of bed to use restroom and wash her face. She continues to endorse abdominal pain, especially upon returning to her bed after walking. Endorses flatus.   Objective:  Vital signs in last 24 hours: Vitals:   02/22/22 1151 02/22/22 1200 02/22/22 1255 02/22/22 1407  BP: (!) 126/98 (!) 120/91 124/85 113/83  Pulse: (!) 117 (!) 113 (!) 114   Resp: 20 (!) 36 (!) 30   Temp:      TempSrc:      SpO2:  92%    Weight:      Height:       Supplemental O2: Venturi Mask SpO2: 92 % O2 Flow Rate (L/min): 8 L/min   Physical Exam:  Constitutional: well-appearing female sitting in bed, in no acute distress HENT: normocephalic atraumatic, mucous membranes moist Eyes: conjunctiva non-erythematous Neck: supple Cardiovascular: regular rate and rhythm, no m/r/g Pulmonary/Chest: normal work of breathing on room air, lungs clear to auscultation bilaterally Abdominal: Large dressing over anterior abdomen. Drains remain in place. MSK: normal bulk and tone Neurological: alert & oriented x 3 Skin: warm and dry   Filed Weights   02/16/22 1003 02/21/22 0500 02/22/22 0429  Weight: 76.9 kg 83.6 kg 81.8 kg     Intake/Output Summary (Last 24 hours) at 02/22/2022 1512 Last data filed at 02/22/2022  1338 Gross per 24 hour  Intake 1386.02 ml  Output 400 ml  Net 986.02 ml   Net IO Since Admission: 7,613.96 mL [02/22/22 1512]  Pertinent Labs:    Latest Ref Rng & Units 02/22/2022    2:55 AM 02/22/2022    1:01 AM 02/21/2022    4:13 AM  CBC  WBC 4.0 - 10.5 K/uL 9.9  10.3  8.2   Hemoglobin 12.0 - 15.0 g/dL 7.7  8.0  8.1   Hematocrit 36.0 - 46.0 % 25.4  24.7  25.9   Platelets 150 - 400 K/uL 528  501  519        Latest Ref Rng & Units 02/22/2022    2:55 AM 02/21/2022    4:13 AM 02/20/2022    4:20 AM  CMP  Glucose 70 - 99 mg/dL 878  676  99   BUN 6 - 20 mg/dL 7  7  6    Creatinine 0.44 - 1.00 mg/dL  7.20  9.47   Sodium 135 - 145 mmol/L 137  139  136   Potassium 3.5 - 5.1 mmol/L 3.5  3.3  3.4   Chloride 98 - 111 mmol/L 102  100  102   CO2 22 - 32 mmol/L 29  32  30   Calcium 8.9 - 10.3 mg/dL 8.1  8.3  8.2     Imaging: CT Angio Chest Pulmonary Embolism (PE) W or WO Contrast  Result Date: 02/22/2022 CLINICAL DATA:  Pulmonary  embolism (PE) suspected, high prob Acute hypoxic respiratory failure, postsurgical. EXAM: CT ANGIOGRAPHY CHEST WITH CONTRAST TECHNIQUE: Multidetector CT imaging of the chest was performed using the standard protocol during bolus administration of intravenous contrast. Multiplanar CT image reconstructions and MIPs were obtained to evaluate the vascular anatomy. RADIATION DOSE REDUCTION: This exam was performed according to the departmental dose-optimization program which includes automated exposure control, adjustment of the mA and/or kV according to patient size and/or use of iterative reconstruction technique. CONTRAST:  31mL OMNIPAQUE IOHEXOL 350 MG/ML SOLN COMPARISON:  Chest radiograph yesterday. Thoracic spine MRI yesterday. Lung bases from recent abdominal CT reviewed. FINDINGS: Cardiovascular: There are no filling defects within the pulmonary arteries to suggest pulmonary embolus. No aortic dissection or acute aortic findings. Common origin of brachiocephalic and left  common carotid artery, variant arch anatomy. The heart is normal in size. Trace pericardial fluid anteriorly. Mediastinum/Nodes: Feeding tube decompresses the esophagus. No mediastinal adenopathy. No bulky hilar adenopathy. No suspicious axillary adenopathy. Lungs/Pleura: Moderate bilateral pleural effusions. There is associated compressive atelectasis. This includes near complete atelectasis of the left lower lobe. No features of pulmonary edema. The trachea and central bronchi are patent. Upper Abdomen: Small amount of perihepatic fluid adjacent to the anterior right hepatic lobe, also seen on prior abdominal CT, series 6, image 89, not significantly changed. Musculoskeletal: There are no acute or suspicious osseous abnormalities. Mild body wall edema. Review of the MIP images confirms the above findings. IMPRESSION: 1. No pulmonary embolus. 2. Moderate bilateral pleural effusions with associated compressive atelectasis. This includes near complete atelectasis of the left lower lobe. 3. Small amount of perihepatic fluid, also seen on prior abdominal CT. This is not well assessed on the current exam due to phase of contrast. Electronically Signed   By: Narda Rutherford M.D.   On: 02/22/2022 00:50   DG CHEST PORT 1 VIEW  Result Date: 02/21/2022 CLINICAL DATA:  Dyspnea EXAM: PORTABLE CHEST 1 VIEW COMPARISON:  02/20/2022 FINDINGS: Feeding catheter is noted extending into the stomach. Right-sided PICC is noted in satisfactory position. Cardiac shadow is stable. Persistent left retrocardiac density is noted. Mild right basilar atelectasis is noted as well. Small effusions are seen. Stable vascular congestion is noted. IMPRESSION: Overall appearance of the chest is stable from the prior exam. Electronically Signed   By: Alcide Clever M.D.   On: 02/21/2022 23:12    Assessment/Plan:   Principal Problem:   Pelvic abscess  Active Problems:   Sepsis (HCC)   MRSA (methicillin resistant staph aureus) culture  positive   Hepatic abscess   Hx of Fitz-Hugh-Curtis syndrome   Protein-calorie malnutrition (HCC)   Patient Summary: Paula Lucas is a 31 y.o. with past medical history of recent admission for tubo-ovarian abscess complicated by septic shock (02/04/22 - 02/07/22) with previous history of tubo-ovarian abscess 2/2 N. Gonorrhea in 2019 who presented to the ED with c/o nausea, vomiting and abdominal pain, currently admitted for multiple pelvic abscesses s/p wash out with ex lap and JP drains placement.  # Resolved sepsis 2/2 multiple pelvic abscesses, with ongoing abdominal pain status post ex lap with washout and bowel adhesion lysis. Appears to be clinically improving, although pain control has been difficult.  -plans for wound vac this week -continue doxycycline for 2 weeks post-op -continue rocephin and flagyl for 3 weeks post-op -ID following, appreciate recs -pain managed with Toradol q6 hours, Gabapentin, Oxycontin 20mg  BID, and Dilaudid 1mg  IV prn up to 4x daily. Wean pain regimen to PO. Patient is  amenable to this. -continue working with PT/OT -cortrak in place for nutrition, working on PO intake -miralax, senna-s qhs, and dulcolax suppository prn  #MRSA bacturia Urine cultures positive for MRSA. ID feels this is from transient bacteremia. TTE negative for endocarditis and thoracic MRI negative for infection. -transitioned to zyvox per ID -She has a PICC line placed  #Acute hypoxic respiratory failure 2/2 volume overload & atelectasis Slightly febrile with tachycardia last night. Neg CXR and CTA, Lactic wnl, normal WBC count. Vitals improved today but continues to require Venturi mask at 5L satting ~92. -Increase Lasix to 40 BID today with goal of -1L -supplemental O2 prn -Encouraged PT and incentive spirometry  #Malnutrition S/P Coretrack placement with ongoing tube feeds. -Advance diet as tolerated  Diet:  Reg + CoreTrack IVF: None, VTE:  Lovenox Code: Full  Adron Bene MD Internal Medicine Resident PGY-1 Please contact the on call pager after 5 pm and on weekends at [phone].

## 2022-02-22 NOTE — Progress Notes (Signed)
Paged by RN regarding patient refusing medications and threatening to leave AMA if she cannot get IV dilaudid. Refusing to war oxygen as well.   Patient seen and evaluated at bedside. She reports that she has been in pain since 6pm. Had received Oxycontin roughly 45 minutes prior, but she states that it does not help. States that she will not cooperate with treatment plan unless she receives dilaudid. Discussed risks of her not cooperating with treatment abx/oxygen including worsening of her infection, worsening of her breathing, and possibility of death. She states that she does not care and that she does not want to be in pain.   On PE, she is tearful, tachycardic to 120s, O2 sats 82%.   Plan: Last dose of dilaudid approximately 6pm. Will give one time dose dilaudid 0.5mg  IV.  Patient to receive previously refused medicines and be placed back on NRB.

## 2022-02-22 NOTE — Progress Notes (Signed)
Physical Therapy Treatment Patient Details Name: Paula Lucas MRN: [id number] DOB: October 23, 1990 Today's Date: 02/22/2022   History of Present Illness Pt is a 31 y/o female admitted 6/19 secondary to worsening abdominal pain. Pt left AMA on 6/18 from ICU for pelvic abscess and had drain placement during that admission. Pt reports she was [redacted] weeks pregnant at that time. Found to have tuboovarian abscess and pelvic abscess. Pt is s/p second drain placement and exchange and repositioning of first drain. Cortrak placed 6/30. PMH includes STIs.    PT Comments    Pt received in supine, agreeable to therapy session with encouragement after discussion on benefits of OOB mobility for improved pulmonary clearance/endurance building. Pt frequently c/o increased pain with deep breaths/mobility tasks and needs increased time/encouragement to perform all tasks. Reinforced importance of elevating HOB throughout day and transfer OOB to chair but pt not receptive to remain OOB once done on [facility]. Pt needing up to minA for transfers and pre-gait activity using RW for stability/safety. Pt reports she is agreeable to attempt hallway ambulation next session if chair follow provided. Pt continues to benefit from PT services to progress toward functional mobility goals.    Recommendations for follow up therapy are one component of a multi-disciplinary discharge planning process, led by the attending physician.  Recommendations may be updated based on patient status, additional functional criteria and insurance authorization.  Follow Up Recommendations  Home health PT (pending progress)     Assistance Recommended at Discharge Intermittent Supervision/Assistance  Patient can return home with the following Help with stairs or ramp for entrance;Assist for transportation;Assistance with cooking/housework;A little help with bathing/dressing/bathroom;A little help with walking and/or transfers   Equipment Recommendations   Rolling walker (2 wheels);BSC/3in1    Recommendations for Other Services       Precautions / Restrictions Precautions Precautions: Fall Precaution Comments: Contact; cortrak, abd x2 drains; watch HR Restrictions Weight Bearing Restrictions: No     Mobility  Bed Mobility Overal bed mobility: Needs Assistance Bed Mobility: Rolling, Sidelying to Sit, Sit to Sidelying Rolling: Min guard Sidelying to sit: Min guard     Sit to sidelying: Min assist General bed mobility comments: cues for improved technique for log rolling to decrease pain; pt appreciative of decreased pain with this technique    Transfers Overall transfer level: Needs assistance Equipment used: Rolling walker (2 wheels) Transfers: Sit to/from Stand, Bed to chair/wheelchair/BSC Sit to Stand: Min assist   Step pivot transfers: Min assist       General transfer comment: mod cues for technique and minA for anterior weight shift and lift assist; pt still c/o fear of heights although bed at lowest position; used RW due to pt c/o R knee instability earlier in day and pt standing longer while using RW this date.    Ambulation/Gait   Pre-gait activities: standing hip flexion x10 reps at RW, min guard for safety       Balance Overall balance assessment: Needs assistance Sitting-balance support: No upper extremity supported, Feet supported Sitting balance-Leahy Scale: Good     Standing balance support: Single extremity supported Standing balance-Leahy Scale: Poor Standing balance comment: improved stability using RW for dynamic tasks; able to static stand during BP with supervision/min guard and U UE support                            Cognition Arousal/Alertness: Awake/alert Behavior During Therapy: WFL for tasks assessed/performed Overall Cognitive Status: No  family/caregiver present to determine baseline cognitive functioning                                 General Comments:  Following all commands appropriately but needs heavy encouragement to mobilize OOB. At end of session, pt reports "I want to walk in the hallway next time".        Exercises General Exercises - Lower Extremity Ankle Circles/Pumps: AROM, Both, 10 reps, Supine Short Arc Quad: AROM, Both, 10 reps, Supine Hip Flexion/Marching: AROM, Both, Seated, Standing, 20 reps (x10 reps ea posture) Other Exercises Other Exercises: STS x 2 reps from EOB for BLE strengthening    General Comments General comments (skin integrity, edema, etc.): BP 112/80 supine and SBP 120's standing; HR 110-120 bpm with exertion; SpO2 93-100% on venturi mask      Pertinent Vitals/Pain Pain Assessment Pain Assessment: Faces Faces Pain Scale: Hurts whole lot Pain Location: pelvic and drain placement region with OOB transfer Pain Descriptors / Indicators: Grimacing, Moaning, Constant, Operative site guarding Pain Intervention(s): Monitored during session, Repositioned, RN gave pain meds during session           PT Goals (current goals can now be found in the care plan section) Acute Rehab PT Goals Patient Stated Goal: to decrease pain PT Goal Formulation: With patient Time For Goal Achievement: 02/27/22 Progress towards PT goals: Progressing toward goals    Frequency    Min 3X/week      PT Plan Current plan remains appropriate       AM-PAC PT "6 Clicks" Mobility   Outcome Measure  Help needed turning from your back to your side while in a flat bed without using bedrails?: A Little Help needed moving from lying on your back to sitting on the side of a flat bed without using bedrails?: A Little Help needed moving to and from a bed to a chair (including a wheelchair)?: A Little Help needed standing up from a chair using your arms (e.g., wheelchair or bedside chair)?: A Little Help needed to walk in hospital room?: Total Help needed climbing 3-5 steps with a railing? : Total 6 Click Score: 14    End of  Session Equipment Utilized During Treatment: Oxygen Activity Tolerance: Patient tolerated treatment well Patient left: in bed;with call bell/phone within reach;with bed alarm set (HOB 45*, pt encouraged to remain this upright for 10-15 mins more if possible, pt not receptive) Nurse Communication: Mobility status;Other (comment) (pt HOB locked at >30* due to cortrak) PT Visit Diagnosis: Other abnormalities of gait and mobility (R26.89);Pain;Muscle weakness (generalized) (M62.81);Difficulty in walking, not elsewhere classified (R26.2) Pain - Right/Left: Left     Time: 1829-9371 PT Time Calculation (min) (ACUTE ONLY): 42 min  Charges:  $Therapeutic Exercise: 8-22 mins $Therapeutic Activity: 23-37 mins                     Clay Solum P., PTA Acute Rehabilitation Services Secure Chat Preferred 9a-5:30pm Office: [phone]    Dorathy Kinsman [facility] 02/22/2022, 4:50 PM

## 2022-02-23 DIAGNOSIS — F32A Depression, unspecified: Secondary | ICD-10-CM | POA: Diagnosis not present

## 2022-02-23 LAB — CBC WITH DIFFERENTIAL/PLATELET
Abs Immature Granulocytes: 0.08 10*3/uL — ABNORMAL HIGH (ref 0.00–0.07)
Basophils Absolute: 0.1 10*3/uL (ref 0.0–0.1)
Basophils Relative: 1 %
Eosinophils Absolute: 0.2 10*3/uL (ref 0.0–0.5)
Eosinophils Relative: 2 %
HCT: 25.7 % — ABNORMAL LOW (ref 36.0–46.0)
Hemoglobin: 8.1 g/dL — ABNORMAL LOW (ref 12.0–15.0)
Immature Granulocytes: 1 %
Lymphocytes Relative: 34 %
Lymphs Abs: 3.1 10*3/uL (ref 0.7–4.0)
MCH: 29 pg (ref 26.0–34.0)
MCHC: 31.5 g/dL (ref 30.0–36.0)
MCV: 92.1 fL (ref 80.0–100.0)
Monocytes Absolute: 0.7 10*3/uL (ref 0.1–1.0)
Monocytes Relative: 8 %
Neutro Abs: 5 10*3/uL (ref 1.7–7.7)
Neutrophils Relative %: 54 %
Platelets: 459 10*3/uL — ABNORMAL HIGH (ref 150–400)
RBC: 2.79 MIL/uL — ABNORMAL LOW (ref 3.87–5.11)
RDW: 19 % — ABNORMAL HIGH (ref 11.5–15.5)
Smear Review: NORMAL
WBC: 9.2 10*3/uL (ref 4.0–10.5)
nRBC: 0 % (ref 0.0–0.2)

## 2022-02-23 LAB — COMPREHENSIVE METABOLIC PANEL
ALT: 14 U/L (ref 0–44)
AST: 32 U/L (ref 15–41)
Albumin: 2 g/dL — ABNORMAL LOW (ref 3.5–5.0)
Alkaline Phosphatase: 48 U/L (ref 38–126)
Anion gap: 5 (ref 5–15)
BUN: 10 mg/dL (ref 6–20)
CO2: 29 mmol/L (ref 22–32)
Calcium: 8.5 mg/dL — ABNORMAL LOW (ref 8.9–10.3)
Chloride: 102 mmol/L (ref 98–111)
Creatinine, Ser: 0.64 mg/dL (ref 0.44–1.00)
GFR, Estimated: >60 mL/min (ref >60)
Glucose, Bld: 99 mg/dL (ref 70–99)
Potassium: 3.8 mmol/L (ref 3.5–5.1)
Sodium: 136 mmol/L (ref 135–145)
Total Bilirubin: 0.2 mg/dL — ABNORMAL LOW (ref 0.3–1.2)
Total Protein: 6.9 g/dL (ref 6.5–8.1)

## 2022-02-23 LAB — GLUCOSE, CAPILLARY
Glucose-Capillary: 108 mg/dL — ABNORMAL HIGH (ref 70–99)
Glucose-Capillary: 101 mg/dL — ABNORMAL HIGH (ref 70–99)
Glucose-Capillary: 132 mg/dL — ABNORMAL HIGH (ref 70–99)
Glucose-Capillary: 103 mg/dL — ABNORMAL HIGH (ref 70–99)
Glucose-Capillary: 102 mg/dL — ABNORMAL HIGH (ref 70–99)

## 2022-02-23 MED ORDER — HYDROMORPHONE HCL 2 MG PO TABS
2.0000 mg | ORAL_TABLET | Freq: Four times a day (QID) | ORAL | Status: DC | PRN
  Administered 2022-02-23 – 2022-02-25 (×6): 2 mg via ORAL
  Filled 2022-02-23 (×6): qty 1

## 2022-02-23 MED ORDER — HYDROMORPHONE HCL 1 MG/ML IJ SOLN
0.5000 mg | Freq: Once | INTRAMUSCULAR | Status: AC
  Administered 2022-02-23: 0.5 mg via INTRAVENOUS
  Filled 2022-02-23: qty 0.5

## 2022-02-23 MED ORDER — HYDROMORPHONE HCL 2 MG PO TABS: 2.0000 mg | ORAL_TABLET | Freq: Four times a day (QID) | ORAL | Status: DC

## 2022-02-23 MED ORDER — FUROSEMIDE 10 MG/ML IJ SOLN
40.0000 mg | Freq: Once | INTRAMUSCULAR | Status: AC
  Administered 2022-02-23: 40 mg via INTRAVENOUS
  Filled 2022-02-23: qty 4

## 2022-02-23 NOTE — Progress Notes (Addendum)
Patient ID: Paula Lucas, female   DOB: 1991-07-09, 31 y.o.   MRN: [id number]  Assessment/Plan: Principal Problem:   Pelvic abscess  Active Problems:   Sepsis (HCC)   MRSA (methicillin resistant staph aureus) culture positive   Hepatic abscess   Hx of Fitz-Hugh-Curtis syndrome   Protein-calorie malnutrition (HCC)  Pelvic abscess/ Hepatic abscess Patient has multiple abscesses in her abdomen and pelvis related to prior PID with current GC and Chlamydia negative. Patient now s/p ex lap with pelvic and abdominal wash out on 02/16/22 Wound vac placed this morning by WOC Nurse, appreciate their help and ongoing care for this patient. Cultures from surgery are sterile. Her WBC is in the normal range.  She is afebrile.  Reports mild improvement in her abdominal pain. Positive flatus.  Continue Linezolid, Rocephin, Doxycycline, Flagyl--appreciate ID input Continue pain medications as needed If further surgery is planned, would need GYN ONC consult.  Continued shortness of breath/chest pain/pulmonary issues CT angio chest negative for PR, but with bilateral pleural effusions, and near complete atelectasis if left lower lobe. X-ray findings consistent with fluid overload.   Pain is likely due to work with breathing and aforementioned findings. On Lasix as per primary team, will continue this as prescribed  Continue oxygen as needed, recommend incentive spirometry, ambulation Will defer decisions about further imaging to primary team  Depression Recommend starting SSRI, will discuss with team [facility] consult placed  Severe protein calorie malnutrition She is now status post core track placement with tube feeds ongoing.  Continue advancing her diet.   Sepsis (HCC)  She is not currently actively septic MRI 02/22/22 negative for thoracic spine infection.   MRSA (methicillin resistant staph aureus) culture positive On Linezolid   Iron deficiency anemia No active bleeding, s/p  Venofer  Appreciate care of Paula Lucas by her primary team Will continue to follow along with daily rounding on patient Please call [phone] [facility] OB/GYN Consult Attending Monday-Friday 8am - 5pm) or [phone] [facility]N Attending On Call all day, every day) for any gynecologic concerns at any time.  Thank you for involving [place] in the care of this patient.  Paula Collins, MD, FACOG Obstetrician & Gynecologist, [facility] for [employer], [facility]    Subjective: Interval History: Still with some SOB and feels her lung is collapsed. Complains of worsening chest pain, desires CXR.  Mild abdominal pain. She does report worsening depression about her prolonged, complicated course. No SI/HI.  She is open to being on medication to help with her symptoms.  Objective: Vital signs in last 24 hours: Temp:  [98.7 F (37.1 C)-99.3 F (37.4 C)] 98.7 F (37.1 C) (07/04 0800) Pulse Rate:  [102-124] 105 (07/04 0800) Resp:  [20-36] 20 (07/04 0800) BP: (110-126)/(74-98) 110/74 (07/04 0800) SpO2:  [92 %-100 %] 98 % (07/04 0800)  Intake/Output from previous day: 07/03 0701 - 07/04 0700 In: 2221  Out: 75 [Drains:75] Intake/Output this shift: No intake/output data recorded.  Physical exam General appearance: alert, cooperative, and appears stated age Head: Normocephalic, without obvious abnormality, atraumatic Lungs: Normal effort Heart: regular rate, regular rhythm Abdomen: less distended, diffusely tender  Incision: Wound vac in place, intact and attached to suction Extremities: Homans sign is negative, no sign of DVT Skin: Skin color, texture, turgor normal. No rashes or lesions Neurologic: Grossly normal  Results for orders placed or performed during the hospital encounter of 02/08/22 (from the past 24 hour(s))  Glucose, capillary  Status: Abnormal   Collection Time: 02/22/22 11:50 AM  Result Value Ref Range   Glucose-Capillary 133  (H) 70 - 99 mg/dL  Glucose, capillary     Status: None   Collection Time: 02/22/22  4:34 PM  Result Value Ref Range   Glucose-Capillary 83 70 - 99 mg/dL  Glucose, capillary     Status: Abnormal   Collection Time: 02/22/22  9:14 PM  Result Value Ref Range   Glucose-Capillary 101 (H) 70 - 99 mg/dL  Glucose, capillary     Status: None   Collection Time: 02/22/22 11:24 PM  Result Value Ref Range   Glucose-Capillary 89 70 - 99 mg/dL  Glucose, capillary     Status: Abnormal   Collection Time: 02/23/22  3:58 AM  Result Value Ref Range   Glucose-Capillary 108 (H) 70 - 99 mg/dL  Comprehensive metabolic panel     Status: Abnormal   Collection Time: 02/23/22  5:47 AM  Result Value Ref Range   Sodium 136 135 - 145 mmol/L   Potassium 3.8 3.5 - 5.1 mmol/L   Chloride 102 98 - 111 mmol/L   CO2 29 22 - 32 mmol/L   Glucose, Bld 99 70 - 99 mg/dL   BUN 10 6 - 20 mg/dL   Creatinine, Ser 1.61 0.44 - 1.00 mg/dL   Calcium 8.5 (L) 8.9 - 10.3 mg/dL   Total Protein 6.9 6.5 - 8.1 g/dL   Albumin 2.0 (L) 3.5 - 5.0 g/dL   AST 32 15 - 41 U/L   ALT 14 0 - 44 U/L   Alkaline Phosphatase 48 38 - 126 U/L   Total Bilirubin 0.2 (L) 0.3 - 1.2 mg/dL   GFR, Estimated >09 >60 mL/min   Anion gap 5 5 - 15  CBC with Differential/Platelet     Status: Abnormal   Collection Time: 02/23/22  5:47 AM  Result Value Ref Range   WBC 9.2 4.0 - 10.5 K/uL   RBC 2.79 (L) 3.87 - 5.11 MIL/uL   Hemoglobin 8.1 (L) 12.0 - 15.0 g/dL   HCT 45.4 (L) 09.8 - 11.9 %   MCV 92.1 80.0 - 100.0 fL   MCH 29.0 26.0 - 34.0 pg   MCHC 31.5 30.0 - 36.0 g/dL   RDW 14.7 (H) 82.9 - 56.2 %   Platelets 459 (H) 150 - 400 K/uL   nRBC 0.0 0.0 - 0.2 %   Neutrophils Relative % 54 %   Neutro Abs 5.0 1.7 - 7.7 K/uL   Lymphocytes Relative 34 %   Lymphs Abs 3.1 0.7 - 4.0 K/uL   Monocytes Relative 8 %   Monocytes Absolute 0.7 0.1 - 1.0 K/uL   Eosinophils Relative 2 %   Eosinophils Absolute 0.2 0.0 - 0.5 K/uL   Basophils Relative 1 %   Basophils Absolute  0.1 0.0 - 0.1 K/uL   WBC Morphology MORPHOLOGY UNREMARKABLE    RBC Morphology See Note    Smear Review Normal platelet morphology    Immature Granulocytes 1 %   Abs Immature Granulocytes 0.08 (H) 0.00 - 0.07 K/uL   Polychromasia PRESENT   Glucose, capillary     Status: Abnormal   Collection Time: 02/23/22  8:30 AM  Result Value Ref Range   Glucose-Capillary 101 (H) 70 - 99 mg/dL    Studies/Results: CT Angio Chest Pulmonary Embolism (PE) W or WO Contrast  Result Date: 02/22/2022 CLINICAL DATA:  Pulmonary embolism (PE) suspected, high prob Acute hypoxic respiratory failure, postsurgical. EXAM: CT ANGIOGRAPHY CHEST WITH  CONTRAST TECHNIQUE: Multidetector CT imaging of the chest was performed using the standard protocol during bolus administration of intravenous contrast. Multiplanar CT image reconstructions and MIPs were obtained to evaluate the vascular anatomy. RADIATION DOSE REDUCTION: This exam was performed according to the departmental dose-optimization program which includes automated exposure control, adjustment of the mA and/or kV according to patient size and/or use of iterative reconstruction technique. CONTRAST:  69mL OMNIPAQUE IOHEXOL 350 MG/ML SOLN COMPARISON:  Chest radiograph yesterday. Thoracic spine MRI yesterday. Lung bases from recent abdominal CT reviewed. FINDINGS: Cardiovascular: There are no filling defects within the pulmonary arteries to suggest pulmonary embolus. No aortic dissection or acute aortic findings. Common origin of brachiocephalic and left common carotid artery, variant arch anatomy. The heart is normal in size. Trace pericardial fluid anteriorly. Mediastinum/Nodes: Feeding tube decompresses the esophagus. No mediastinal adenopathy. No bulky hilar adenopathy. No suspicious axillary adenopathy. Lungs/Pleura: Moderate bilateral pleural effusions. There is associated compressive atelectasis. This includes near complete atelectasis of the left lower lobe. No features of  pulmonary edema. The trachea and central bronchi are patent. Upper Abdomen: Small amount of perihepatic fluid adjacent to the anterior right hepatic lobe, also seen on prior abdominal CT, series 6, image 89, not significantly changed. Musculoskeletal: There are no acute or suspicious osseous abnormalities. Mild body wall edema. Review of the MIP images confirms the above findings. IMPRESSION: 1. No pulmonary embolus. 2. Moderate bilateral pleural effusions with associated compressive atelectasis. This includes near complete atelectasis of the left lower lobe. 3. Small amount of perihepatic fluid, also seen on prior abdominal CT. This is not well assessed on the current exam due to phase of contrast. Electronically Signed   By: Narda Rutherford M.D.   On: 02/22/2022 00:50   DG CHEST PORT 1 VIEW  Result Date: 02/21/2022 CLINICAL DATA:  Dyspnea EXAM: PORTABLE CHEST 1 VIEW COMPARISON:  02/20/2022 FINDINGS: Feeding catheter is noted extending into the stomach. Right-sided PICC is noted in satisfactory position. Cardiac shadow is stable. Persistent left retrocardiac density is noted. Mild right basilar atelectasis is noted as well. Small effusions are seen. Stable vascular congestion is noted. IMPRESSION: Overall appearance of the chest is stable from the prior exam. Electronically Signed   By: Alcide Clever M.D.   On: 02/21/2022 23:12   MR THORACIC SPINE W WO CONTRAST  Result Date: 02/21/2022 CLINICAL DATA:  31 year old female. History of MRSA, PID. Pelvic and abdominal abscesses. EXAM: MRI THORACIC WITHOUT AND WITH CONTRAST TECHNIQUE: Multiplanar and multiecho pulse sequences of the thoracic spine were obtained without and with intravenous contrast. CONTRAST:  7.78mL GADAVIST GADOBUTROL 1 MMOL/ML IV SOLN COMPARISON:  Lumbar MRI 02/12/2022. CT Abdomen and Pelvis 02/14/2022. FINDINGS: Limited cervical spine imaging: Negative aside from mild straightening and reversal of cervical lordosis. Thoracic spine segmentation:  Appears to be normal and concordant with the lumbar numbering last month. Alignment: Maintained thoracic vertebral height and alignment. Relatively normal kyphosis. Vertebrae: No marrow edema or evidence of acute osseous abnormality. Somewhat generalized decreased T1 marrow signal in the visible spine and ribs may reflect red marrow reactivation. Cord: No abnormal intradural enhancement or dural thickening. No intraspinal fluid collection is evident. Thoracic spinal cord signal and morphology are within normal limits. Paraspinal and other soft tissues: Bilateral pleural effusions and compressed lung parenchyma similar to the recent CT Abdomen and Pelvis. Negative visible kidneys. Thoracic paraspinal soft tissues remain within normal limits. Thoracic epidural space is within normal limits. Disc levels: Maintained normal thoracic intervertebral disc signal and morphology throughout. There  is mild bilateral facet hypertrophy at T9-T10. No thoracic spinal stenosis. No significant thoracic neural foraminal stenosis. IMPRESSION: 1. No evidence of thoracic spinal infection. Normal thoracic discs. No thoracic spinal stenosis or significant neural impingement. 2. Bilateral pleural effusions and compressed lung parenchyma similar to the recent CT Abdomen and Pelvis. Electronically Signed   By: Odessa FlemingH  Hall M.D.   On: 02/21/2022 10:15   DG CHEST PORT 1 VIEW  Result Date: 02/20/2022 CLINICAL DATA:  Shortness of breath EXAM: PORTABLE CHEST - 1 VIEW COMPARISON:  02/18/2022 FINDINGS: Unchanged mild cardiomegaly and mild pulmonary vascular congestion. Bibasilar opacities likely combination of small pleural effusions with adjacent atelectasis. Feeding tube terminates in the left upper quadrant. Right upper extremity PICC terminates at the cavoatrial junction. IMPRESSION: 1. Mild cardiomegaly. 2. Mild pulmonary vascular congestion. 3. Small bilateral pleural effusions with adjacent atelectasis. Electronically Signed   By: Acquanetta BellingFarhaan  Mir  M.D.   On: 02/20/2022 09:30   DG Abd Portable 1V  Result Date: 02/19/2022 CLINICAL DATA:  Provided history: Encounter for feeding tube placement. EXAM: PORTABLE ABDOMEN - 1 VIEW COMPARISON:  Abdominal radiograph 02/18/2022. FINDINGS: An enteric tube passes below the level left hemidiaphragm with tip projecting in the expected location of the pylorus or first portion of the duodenum. Nonobstructive bowel gas pattern within the relies abdomen. No acute bony abnormality identified. IMPRESSION: Enteric tube present with tip projecting in the expected location of the pylorus or first portion of the duodenum. Nonspecific, nonobstructive bowel gas pattern within the visualized abdomen. Electronically Signed   By: Jackey LogeKyle  Golden D.O.   On: 02/19/2022 14:20   DG CHEST PORT 1 VIEW  Result Date: 02/18/2022 CLINICAL DATA:  Central line placement EXAM: PORTABLE CHEST 1 VIEW COMPARISON:  Radiograph 09/06/2021 FINDINGS: Right upper extremity PICC tip crosses the midline, across the brachiocephalic confluence, tip overlies the left brachiocephalic vein. Borderline enlarged cardiac silhouette. Bibasilar consolidations, left greater than right. Small bilateral pleural effusions. Low lung volumes. No pneumothorax. No acute osseous abnormality. IMPRESSION: Right upper extremity PICC tip courses across the brachiocephalic confluence, tip overlying the left brachiocephalic vein. Recommend repositioning. Bilateral pleural effusions with adjacent bibasilar atelectasis, left greater than right. Low lung volumes. These results will be called to the ordering clinician or representative by the Radiologist Assistant, and communication documented in the PACS or [employer] Dashboard. Electronically Signed   By: Caprice RenshawJacob  Kahn M.D.   On: 02/18/2022 18:18   US EKG SITE RITE  Result Date: 02/18/2022 If Site Rite image not attached, placement could not be confirmed due to current cardiac rhythm.  DG Abd Portable 1V  Result Date:  02/18/2022 CLINICAL DATA:  Abdominal distension. EXAM: PORTABLE ABDOMEN - 1 VIEW COMPARISON:  Abdominal CT February 14, 2022 FINDINGS: Drainage catheters again seen in the lower abdomen/pelvis. No significant change in the appearance of the bowel pattern. No evidence of small-bowel obstruction. IMPRESSION: Stable appearance of the bowel pattern. Electronically Signed   By: Ted Mcalpineobrinka  Dimitrova M.D.   On: 02/18/2022 12:52   CT ABDOMEN PELVIS W CONTRAST  Result Date: 02/14/2022 CLINICAL DATA:  Follow-up intra-abdominal abscesses. EXAM: CT ABDOMEN AND PELVIS WITH CONTRAST TECHNIQUE: Multidetector CT imaging of the abdomen and pelvis was performed using the standard protocol following bolus administration of intravenous contrast. RADIATION DOSE REDUCTION: This exam was performed according to the departmental dose-optimization program which includes automated exposure control, adjustment of the mA and/or kV according to patient size and/or use of iterative reconstruction technique. CONTRAST:  100mL OMNIPAQUE IOHEXOL 300 MG/ML  SOLN  COMPARISON:  CT scan 02/08/2022 FINDINGS: Lower chest: Persistent moderate-sized pleural effusions with overlying atelectasis. Hepatobiliary: Small residual lentiform rim enhancing abscess at the dome of the liver anteriorly measuring a maximum of 5.6 x 1.1 cm. This previously measured approximately 9.3 x 1.1 cm. Inferiorly there is a persistent perihepatic abscess. It measures approximately 9.5 x 3.1 cm and previously measured 10.1 x 3.7 cm. No worrisome hepatic lesions or intrahepatic biliary dilatation. The gallbladder is unremarkable. No common bile duct dilatation. Pancreas: No mass, inflammation or ductal dilatation. Spleen: Normal size.  No focal lesions. Adrenals/Urinary Tract: Adrenal glands and kidneys are unremarkable and stable. The bladder is grossly normal. Stomach/Bowel: The stomach, duodenum, small bowel and colon grossly normal. Vascular/Lymphatic: The aorta and branch vessels  are patent. The major venous structures are patent. Stable borderline enlarged mesenteric and retroperitoneal lymph nodes, likely reactive/inflammatory. Reproductive: The uterus and ovaries are stable. Other: Percutaneous drainage catheter in the upper right pelvis with a persistent rim enhancing abscess measuring approximately 8.8 x 3.5 cm and previously measuring 9.8 x 5.6 cm. A second drainage catheter in the lower right cul-de-sac area with a small persistent rim enhancing abscess measuring 4.1 x 2.0 cm. This previously measured 5.2 x 4.9 cm. Left-sided rounded pelvic abscess measures 3.6 x 2.9 cm on image 71/3. This previously measured 3.5 x 2.7 cm. Persistent diffuse mesenteric and subcutaneous inflammations/edema. Musculoskeletal: No significant bony findings. IMPRESSION: 1. Persistent moderate-sized pleural effusions with overlying atelectasis. 2. Persistent but slightly smaller undrained perihepatic abscesses. 3. Two right-sided pelvic drainage catheters with persistent but smaller surrounding abscesses. 4. Left-sided undrained pelvic abscess is slightly smaller also. 5. Persistent diffuse mesenteric and subcutaneous inflammation. 6. Stable borderline enlarged mesenteric and retroperitoneal lymph nodes, likely reactive/inflammatory. Electronically Signed   By: Rudie Meyer M.D.   On: 02/14/2022 14:46   MR Lumbar Spine W Wo Contrast  Result Date: 02/13/2022 CLINICAL DATA:  Initial evaluation for back pain, history of MRSA bacteremia. EXAM: MRI LUMBAR SPINE WITHOUT AND WITH CONTRAST TECHNIQUE: Multiplanar and multiecho pulse sequences of the lumbar spine were obtained without and with intravenous contrast. CONTRAST:  7.38mL GADAVIST GADOBUTROL 1 MMOL/ML IV SOLN COMPARISON:  Prior CT from 02/08/2022. FINDINGS: Segmentation: Standard. Lowest well-formed disc space labeled the L5-S1 level. Alignment: Mild levoscoliosis with straightening of the normal lumbar lordosis. No listhesis. Vertebrae: Vertebral body  height maintained without acute or chronic fracture. Bone marrow signal intensity diffusely decreased on T1 weighted sequence, nonspecific, but most commonly related to anemia, smoking or obesity. No discrete or worrisome osseous lesions. No evidence for osteomyelitis discitis or septic arthritis within the lumbar spine. Conus medullaris and cauda equina: Conus extends to the L1-2 level. Conus and cauda equina appear normal. Paraspinal and other soft tissues: Paraspinous soft tissues demonstrate no acute finding. Multi loculated collections/abscesses with surrounding enhancement partially seen within the partially visualized pelvis, better characterized on recent CT. Remainder of the visualized visceral structures otherwise unremarkable. Disc levels: L1-2:  Unremarkable. L2-3: Small left subarticular disc protrusion mildly indents the left ventral thecal sac (series 12, image 16). Borderline mild narrowing of the left lateral recess. Central canal remains widely patent. No foraminal stenosis. L3-4:  Unremarkable. L4-5:  Unremarkable. L5-S1: Left subarticular disc protrusion extends into the left lateral recess, contacting the descending left S1 nerve root (series 12, image 34). Resultant mild left lateral recess stenosis. Central canal remains patent. No significant foraminal stenosis. IMPRESSION: 1. No MRI evidence for acute infection within the lumbar spine. 2. Left subarticular disc protrusion at  L5-S1, contacting the descending left S1 nerve root in the left lateral recess. 3. Small left subarticular disc protrusion at L2-3 with borderline mild left lateral recess stenosis. 4. Multiloculated collections/abscesses within the partially visualized pelvis, better characterized on recent CT. Electronically Signed   By: Rise Mu M.D.   On: 02/13/2022 04:04   ECHOCARDIOGRAM COMPLETE  Result Date: 02/12/2022    ECHOCARDIOGRAM REPORT   Patient Name:   COURTENEY ALDERETE Date of Exam: 02/12/2022 Medical Rec  #:  [id number]        Height:       63.0 in Accession #:    [id number]       Weight:       169.5 lb Date of Birth:  September 26, 1990        BSA:          1.802 m Patient Age:    31 years         BP:           113/87 mmHg Patient Gender: F                HR:           98 bpm. Exam Location:  Inpatient Procedure: 2D Echo, Cardiac Doppler and Color Doppler Indications:   Staph aureus infection  History:       Patient has no prior history of Echocardiogram examinations.                Sepsis.  Sonographer:   Ross Ludwig RDCS (AE) Referring      TRUNG T VU Phys: IMPRESSIONS  1. Left ventricular ejection fraction, by estimation, is 60 to 65%. The left ventricle has normal function. The left ventricle has no regional wall motion abnormalities. Left ventricular diastolic parameters were normal.  2. Right ventricular systolic function is normal. The right ventricular size is normal.  3. The mitral valve is normal in structure. Trivial mitral valve regurgitation. No evidence of mitral stenosis.  4. The aortic valve is normal in structure. Aortic valve regurgitation is not visualized. No aortic stenosis is present. FINDINGS  Left Ventricle: Left ventricular ejection fraction, by estimation, is 60 to 65%. The left ventricle has normal function. The left ventricle has no regional wall motion abnormalities. The left ventricular internal cavity size was normal in size. There is  no left ventricular hypertrophy. Left ventricular diastolic parameters were normal. Right Ventricle: The right ventricular size is normal. Right vetricular wall thickness was not well visualized. Right ventricular systolic function is normal. Left Atrium: Left atrial size was normal in size. Right Atrium: Right atrial size was normal in size. Pericardium: There is no evidence of pericardial effusion. Mitral Valve: The mitral valve is normal in structure. Trivial mitral valve regurgitation. No evidence of mitral valve stenosis. Tricuspid Valve: The tricuspid valve  is normal in structure. Tricuspid valve regurgitation is trivial. Aortic Valve: The aortic valve is normal in structure. Aortic valve regurgitation is not visualized. No aortic stenosis is present. Aortic valve mean gradient measures 4.0 mmHg. Aortic valve peak gradient measures 7.1 mmHg. Aortic valve area, by VTI measures 2.25 cm. Pulmonic Valve: The pulmonic valve was normal in structure. Pulmonic valve regurgitation is not visualized. Aorta: The aortic root and ascending aorta are structurally normal, with no evidence of dilitation. IAS/Shunts: The atrial septum is grossly normal.  LEFT VENTRICLE PLAX 2D LVIDd:         4.80 cm LVIDs:         3.40  cm LV PW:         0.90 cm LV IVS:        1.10 cm LVOT diam:     1.90 cm LV SV:         49 LV SV Index:   27 LVOT Area:     2.84 cm  RIGHT VENTRICLE             IVC RV Basal diam:  2.30 cm     IVC diam: 1.50 cm RV S prime:     12.40 cm/s TAPSE (M-mode): 2.1 cm LEFT ATRIUM             Index        RIGHT ATRIUM          Index LA diam:        3.80 cm 2.11 cm/m   RA Area:     9.13 cm LA Vol (A2C):   26.4 ml 14.65 ml/m  RA Volume:   15.80 ml 8.77 ml/m LA Vol (A4C):   29.7 ml 16.48 ml/m LA Biplane Vol: 29.3 ml 16.26 ml/m  AORTIC VALVE AV Area (Vmax):    2.24 cm AV Area (Vmean):   2.21 cm AV Area (VTI):     2.25 cm AV Vmax:           133.00 cm/s AV Vmean:          87.900 cm/s AV VTI:            0.218 m AV Peak Grad:      7.1 mmHg AV Mean Grad:      4.0 mmHg LVOT Vmax:         105.00 cm/s LVOT Vmean:        68.600 cm/s LVOT VTI:          0.173 m LVOT/AV VTI ratio: 0.79  AORTA Ao Root diam: 2.90 cm Ao Asc diam:  2.70 cm  SHUNTS Systemic VTI:  0.17 m Systemic Diam: 1.90 cm Kristeen Miss MD Electronically signed by Kristeen Miss MD Signature Date/Time: 02/12/2022/4:39:15 PM    Final    VAS [place] LOWER EXTREMITY VENOUS (DVT)  Result Date: 02/12/2022  Lower Venous DVT Study Patient Name:  DESHANTE CASSELL Thakur  Date of Exam:   02/12/2022 Medical Rec #: [id number]         Accession  #:    [id number] Date of Birth: Sep 08, 1990         Patient Gender: F Patient Age:   71 years Exam Location[facility]  [facility] Procedure:      VAS [place] LOWER EXTREMITY VENOUS (DVT) Referring Phys: JULIE MACHEN --------------------------------------------------------------------------------  Indications: Pain.  Risk Factors: None identified. Limitations: Poor patient cooperation, patient pain tolerance. Comparison Study: No prior studies. Performing Technologist: Chanda Busing RVT  Examination Guidelines: A complete evaluation includes B-mode imaging, spectral Doppler, color Doppler, and power Doppler as needed of all accessible portions of each vessel. Bilateral testing is considered an integral part of a complete examination. Limited examinations for reoccurring indications may be performed as noted. The reflux portion of the exam is performed with the patient in reverse Trendelenburg.  +---------+---------------+---------+-----------+----------+--------------+ RIGHT    CompressibilityPhasicitySpontaneityPropertiesThrombus Aging +---------+---------------+---------+-----------+----------+--------------+ CFV      Full           Yes      Yes                                 +---------+---------------+---------+-----------+----------+--------------+  SFJ      Full                                                        +---------+---------------+---------+-----------+----------+--------------+ FV Prox  Full                                                        +---------+---------------+---------+-----------+----------+--------------+ FV Mid   Full                                                        +---------+---------------+---------+-----------+----------+--------------+ FV DistalFull                                                        +---------+---------------+---------+-----------+----------+--------------+ PFV      Full                                                         +---------+---------------+---------+-----------+----------+--------------+ POP      Full           Yes      Yes                                 +---------+---------------+---------+-----------+----------+--------------+ PTV      Full                                                        +---------+---------------+---------+-----------+----------+--------------+ PERO     Full                                                        +---------+---------------+---------+-----------+----------+--------------+   +----+---------------+---------+-----------+----------+--------------+ LEFTCompressibilityPhasicitySpontaneityPropertiesThrombus Aging +----+---------------+---------+-----------+----------+--------------+ CFV Full           Yes      Yes                                 +----+---------------+---------+-----------+----------+--------------+    Summary: RIGHT: - There is no evidence of deep vein thrombosis in the lower extremity.  - No cystic structure found in the popliteal fossa.  LEFT: - No evidence of common femoral vein obstruction.  *See table(s) above for measurements and observations. Electronically signed by Sherald Hess MD on 02/12/2022  at 11:23:51 AM.    Final    CT IMAGE GUIDED DRAINAGE BY PERCUTANEOUS CATHETER  Result Date: 02/10/2022 INDICATION: History of tubo-ovarian abscess, post CT-guided placement right lower abdominal/pelvic drainage catheter on 02/05/2022. Postprocedural CT scan performed 02/08/2022 demonstrates an unchanged fluid collection within the right lower pelvis and as such request made for placement of an additional percutaneous drainage catheter for infection source control purposes. Additionally, there is request for CT-guided exchange and/or repositioning of the pre-existing drainage catheter into the more dominant component of the residual pelvic abscess EXAM: 1. CT GUIDED RIGHT TRANS GLUTEAL APPROACH TUBO-OVARIAN ABSCESS  DRAINAGE CATHETER PLACEMENT 2. CT-GUIDED EXCHANGE AND REPOSITIONING OF EXISTING RIGHT LOWER ABDOMINAL/PELVIC DRAINAGE CATHETER COMPARISON:  CT abdomen pelvis-02/08/2022; 02/04/2022 CT guided right lower abdominal/pelvic abscess drainage catheter placement-02/05/2022 MEDICATIONS: The patient is currently admitted to the hospital and receiving intravenous antibiotics. The antibiotics were administered within an appropriate time frame prior to the initiation of the procedure. ANESTHESIA/SEDATION: Moderate (conscious) sedation was employed during this procedure as administered by the Interventional Radiology RN. A total of Versed 4 mg, Dilaudid 1 mg and Fentanyl 150 mcg was administered intravenously. Moderate Sedation Time: 60 minutes. The patient's level of consciousness and vital signs were monitored continuously by radiology nursing throughout the procedure under my direct supervision. CONTRAST:  None COMPLICATIONS: None immediate. PROCEDURE: RADIATION DOSE REDUCTION: This exam was performed according to the departmental dose-optimization program which includes automated exposure control, adjustment of the mA and/or kV according to patient size and/or use of iterative reconstruction technique. Informed written consent was obtained from the patient after a discussion of the risks, benefits and alternatives to treatment. The patient was initially placed prone on the CT gantry and a pre procedural CT was performed re-demonstrating the known abscess/fluid collection within the right hemipelvis with dominant component measuring approximately 5.5 x 5.4 cm (image 11, series 2). The procedure was planned. A timeout was performed prior to the initiation of the procedure. The skin overlying the right buttocks was prepped and draped in the usual sterile fashion. The overlying soft tissues were anesthetized with 1% lidocaine with epinephrine. Appropriate trajectory was planned with the use of a 22 gauge spinal needle. An 18  gauge trocar needle was advanced into the abscess/fluid collection and a short Amplatz super stiff wire was coiled within the collection. Appropriate positioning was confirmed with a limited CT scan. The tract was serially dilated allowing placement of a 10 [place] all-purpose drainage catheter. Appropriate positioning was confirmed with a limited postprocedural CT scan. Approximately 65 ml of purulent appearing serous fluid was aspirated. The tube was connected to a drainage bag and secured in place within interrupted suture and a Stat Lock device. A dressing was applied. The patient was then repositioned supine on the CT gantry and noncontrast images were obtained of the lower pelvis and pre-existing right anterior approach lower abdominal/pelvic drainage catheter. Attempts were made to retract the existing drainage catheter into the dominant component of the residual collection however this proved uncomfortable for the patient. As such, the external portion of the drainage catheter was prepped and draped in the usual sterile fashion. The external portion of the drainage catheter was cut and cannulated with a short Amplatz wire. Existing 12 French drainage catheter was then exchanged for a new 12 French percutaneous catheter which under intermittent CT guidance was repositioned into the dominant residual component of the right lower quadrant collection (series 11). The drainage catheter was connected to a gravity bag and secured in place within  interrupted suture and a Stat Lock device. Patient tolerated the above procedures well (though did require a generous amount of conscious sedation medications) without immediate postprocedural complication. IMPRESSION: 1. Successful CT guided placement of a 10 [place] all purpose drain catheter into the undrained pelvic abscess via right trans gluteal approach with aspiration of 65 mL of purulent fluid. Samples were sent to the laboratory as requested by the ordering clinical  team. 2. Successful CT guided exchange and repositioning of pre-existing 12 French right anterior abdominal/pelvic drainage catheter with end now coiled and locked within the residual anteriorly located abscess cavity. Electronically Signed   By: Simonne Come M.D.   On: 02/10/2022 16:33   CT ABSCESS CATH EXCHANGE  Result Date: 02/10/2022 INDICATION: History of tubo-ovarian abscess, post CT-guided placement right lower abdominal/pelvic drainage catheter on 02/05/2022. Postprocedural CT scan performed 02/08/2022 demonstrates an unchanged fluid collection within the right lower pelvis and as such request made for placement of an additional percutaneous drainage catheter for infection source control purposes. Additionally, there is request for CT-guided exchange and/or repositioning of the pre-existing drainage catheter into the more dominant component of the residual pelvic abscess EXAM: 1. CT GUIDED RIGHT TRANS GLUTEAL APPROACH TUBO-OVARIAN ABSCESS DRAINAGE CATHETER PLACEMENT 2. CT-GUIDED EXCHANGE AND REPOSITIONING OF EXISTING RIGHT LOWER ABDOMINAL/PELVIC DRAINAGE CATHETER COMPARISON:  CT abdomen pelvis-02/08/2022; 02/04/2022 CT guided right lower abdominal/pelvic abscess drainage catheter placement-02/05/2022 MEDICATIONS: The patient is currently admitted to the hospital and receiving intravenous antibiotics. The antibiotics were administered within an appropriate time frame prior to the initiation of the procedure. ANESTHESIA/SEDATION: Moderate (conscious) sedation was employed during this procedure as administered by the Interventional Radiology RN. A total of Versed 4 mg, Dilaudid 1 mg and Fentanyl 150 mcg was administered intravenously. Moderate Sedation Time: 60 minutes. The patient's level of consciousness and vital signs were monitored continuously by radiology nursing throughout the procedure under my direct supervision. CONTRAST:  None COMPLICATIONS: None immediate. PROCEDURE: RADIATION DOSE REDUCTION:  This exam was performed according to the departmental dose-optimization program which includes automated exposure control, adjustment of the mA and/or kV according to patient size and/or use of iterative reconstruction technique. Informed written consent was obtained from the patient after a discussion of the risks, benefits and alternatives to treatment. The patient was initially placed prone on the CT gantry and a pre procedural CT was performed re-demonstrating the known abscess/fluid collection within the right hemipelvis with dominant component measuring approximately 5.5 x 5.4 cm (image 11, series 2). The procedure was planned. A timeout was performed prior to the initiation of the procedure. The skin overlying the right buttocks was prepped and draped in the usual sterile fashion. The overlying soft tissues were anesthetized with 1% lidocaine with epinephrine. Appropriate trajectory was planned with the use of a 22 gauge spinal needle. An 18 gauge trocar needle was advanced into the abscess/fluid collection and a short Amplatz super stiff wire was coiled within the collection. Appropriate positioning was confirmed with a limited CT scan. The tract was serially dilated allowing placement of a 10 [place] all-purpose drainage catheter. Appropriate positioning was confirmed with a limited postprocedural CT scan. Approximately 65 ml of purulent appearing serous fluid was aspirated. The tube was connected to a drainage bag and secured in place within interrupted suture and a Stat Lock device. A dressing was applied. The patient was then repositioned supine on the CT gantry and noncontrast images were obtained of the lower pelvis and pre-existing right anterior approach lower abdominal/pelvic drainage catheter. Attempts were made  to retract the existing drainage catheter into the dominant component of the residual collection however this proved uncomfortable for the patient. As such, the external portion of the  drainage catheter was prepped and draped in the usual sterile fashion. The external portion of the drainage catheter was cut and cannulated with a short Amplatz wire. Existing 12 French drainage catheter was then exchanged for a new 12 French percutaneous catheter which under intermittent CT guidance was repositioned into the dominant residual component of the right lower quadrant collection (series 11). The drainage catheter was connected to a gravity bag and secured in place within interrupted suture and a Stat Lock device. Patient tolerated the above procedures well (though did require a generous amount of conscious sedation medications) without immediate postprocedural complication. IMPRESSION: 1. Successful CT guided placement of a 10 [place] all purpose drain catheter into the undrained pelvic abscess via right trans gluteal approach with aspiration of 65 mL of purulent fluid. Samples were sent to the laboratory as requested by the ordering clinical team. 2. Successful CT guided exchange and repositioning of pre-existing 12 French right anterior abdominal/pelvic drainage catheter with end now coiled and locked within the residual anteriorly located abscess cavity. Electronically Signed   By: Simonne Come M.D.   On: 02/10/2022 16:33   CT ABDOMEN PELVIS W CONTRAST  Result Date: 02/08/2022 CLINICAL DATA:  Sepsis EXAM: CT ABDOMEN AND PELVIS WITH CONTRAST TECHNIQUE: Multidetector CT imaging of the abdomen and pelvis was performed using the standard protocol following bolus administration of intravenous contrast. RADIATION DOSE REDUCTION: This exam was performed according to the departmental dose-optimization program which includes automated exposure control, adjustment of the mA and/or kV according to patient size and/or use of iterative reconstruction technique. CONTRAST:  OMNIPAQUE IOHEXOL 300 MG/ML  SOLN COMPARISON:  Previous studies including the CT done on 26-Feb-2022 FINDINGS: Lower chest: Small  bilateral pleural effusions are seen more so on the right side. There are linear patchy infiltrates in the lingula and both lower lobes suggesting atelectasis/pneumonia. Hepatobiliary: No focal abnormality is seen in the liver. There is no dilation of bile ducts. Gallbladder is unremarkable. Small ascites is noted in the perihepatic region. Pancreas: No focal abnormality is seen. Spleen: Unremarkable. Adrenals/Urinary Tract: Adrenals are not enlarged. There is no hydronephrosis. There are no renal or ureteral stones. Urinary bladder is unremarkable. There is 3 mm low-density in the midportion of left kidney which is too small to be characterized. Stomach/Bowel: Stomach is not distended. Small bowel loops are not dilated. Appendix is not seen. There is no significant wall thickening in the colon. There is interval placement of percutaneous drainage catheter with its tip in the right lower abdomen. There is interval decrease in size of the complex fluid collection in the right lower quadrant. There is 4 cm loculated fluid collection slightly to the left of midline at the level of pelvic inlet. There is 7.2 x 5.3 x 4.9 cm loculated fluid collection with minimally enhancing margin in the cul-de-sac in the pelvis. There are no pockets of air within these loculated fluid collections. There is stranding in the fat planes in the lower abdomen and pelvis. Vascular/Lymphatic: Vascular structures are unremarkable. Reproductive: Uterus is to the right of midline. Other: There is no pneumoperitoneum. Musculoskeletal: No acute findings are seen. IMPRESSION: Interval placement of percutaneous drainage catheter within air-fluid collection in the right lower abdomen with interval decrease in size. There is 7.2 cm loculated fluid collection in the cul-de-sac in the pelvis with slightly enhancing  margins. There is another 4 cm loculated fluid collection in the left side of the pelvis. These may suggest loculated serous fluid  collections or abscesses. Small ascites is noted in the perihepatic region. There is no evidence of intestinal obstruction or pneumoperitoneum. There is no hydronephrosis. Small bilateral pleural effusions. There are linear patchy infiltrates in both lower lung fields suggesting atelectasis/pneumonia. Electronically Signed   By: Ernie Avena M.D.   On: 02/08/2022 09:41   CT IMAGE GUIDED DRAINAGE BY PERCUTANEOUS CATHETER  Result Date: 02/05/2022 INDICATION: PID with large abdominopelvic fluid collection. EXAM: CT GUIDED DRAINAGE CATHETER PLACEMENT INTO ABDOMINOPELVIC FLUID COLLECTION RADIATION DOSE REDUCTION: This exam was performed according to the departmental dose-optimization program which includes automated exposure control, adjustment of the mA and/or kV according to patient size and/or use of iterative reconstruction technique. COMPARISON:  CT AP, 02/04/2022 MEDICATIONS: The patient is currently admitted to the hospital and receiving intravenous antibiotics. The antibiotics were administered within an appropriate time frame prior to the initiation of the procedure. ANESTHESIA/SEDATION: Moderate (conscious) sedation was employed during this procedure. A total of Versed 3 mg and Fentanyl 75 mcg was administered intravenously. Additionally, 1 mg Dilaudid was administered IV. Moderate Sedation Time: 19 minutes. The patient's level of consciousness and vital signs were monitored continuously by radiology nursing throughout the procedure under my direct supervision. CONTRAST:  None COMPLICATIONS: None immediate. PROCEDURE: Informed written consent was obtained from the patient and/or patient's representative after a discussion of the risks, benefits and alternatives to treatment. The patient was placed supine on the CT gantry and a pre procedural CT was performed re-demonstrating the known abscess/fluid collection within the lower abdomen/pelvis. The procedure was planned. A timeout was performed prior to  the initiation of the procedure. The RIGHT lower quadrant was prepped and draped in the usual sterile fashion. The overlying soft tissues were anesthetized with 1% lidocaine with epinephrine. Appropriate trajectory was planned with the use of a 22 gauge spinal needle. An 18 gauge trocar needle was advanced into the abscess/fluid collection and a short Amplatz super stiff wire was coiled within the collection. Appropriate positioning was confirmed with a limited CT scan. The tract was serially dilated allowing placement of a 12 [place] all-purpose drainage catheter. Appropriate positioning was confirmed with a limited postprocedural CT scan. 5 mL ml of serous fluid was aspirated and submitted for analysis. The tube was connected to a drainage bag and sutured in place. A dressing was placed. The patient tolerated the procedure well without immediate post procedural complication. IMPRESSION: Successful CT guided placement of a 12 Fr drainage catheter into the abdominopelvic collection with aspiration of serous fluid, as above. Samples were sent to the laboratory as requested by the ordering clinical team. Roanna Banning, MD [facility] Radiology Electronically Signed   By: Roanna Banning M.D.   On: 02/05/2022 18:08   CT ABDOMEN PELVIS WO CONTRAST  Result Date: 02/04/2022 CLINICAL DATA:  Abdominal pain, acute, nonlocalized septic shock, abnormal uterine US, concern for PID EXAM: CT ABDOMEN AND PELVIS WITHOUT CONTRAST TECHNIQUE: Multidetector CT imaging of the abdomen and pelvis was performed following the standard protocol without IV contrast. RADIATION DOSE REDUCTION: This exam was performed according to the departmental dose-optimization program which includes automated exposure control, adjustment of the mA and/or kV according to patient size and/or use of iterative reconstruction technique. COMPARISON:  Pelvic ultrasound today FINDINGS: Lower chest: Pleural thickening  noted peripherally in the right lower hemithorax, possibly representing trace loculated pleural effusion.  No confluent airspace opacities. Hepatobiliary: No focal hepatic abnormality. Gallbladder unremarkable. Pancreas: No focal abnormality or ductal dilatation. Spleen: No focal abnormality.  Normal size. Adrenals/Urinary Tract: No adrenal abnormality. No focal renal abnormality. No stones or hydronephrosis. Urinary bladder is unremarkable. Stomach/Bowel: Wall thickening noted in the cecum which is immediately adjacent to the large pelvic fluid collection described below. This could reflect colitis or be secondarily inflamed. Stomach and small bowel grossly unremarkable. Vascular/Lymphatic: No evidence of aneurysm or adenopathy. Reproductive: Large complex cystic area or fluid collection seen in the pelvis extending from the cul-de-sac superiorly into the right lower quadrant adjacent to the cecum. This measures approximately 12 x 8 cm on image 64 of series 3. Internal areas of increased density. This could reflect blood or abscess/PID. Uterus grossly unremarkable on this noncontrast study. Ovaries not definitively visualized. Other: There is free fluid adjacent to the liver and spleen. Musculoskeletal: No acute bony abnormality. IMPRESSION: Complex process in the pelvis including what appears to be a large complex fluid collection measuring 12 x 8 cm extending from the cul-de-sac superiorly to the cecum which could reflect blood products or abscess. This also could conceivably be related to and ovary. Neither ovary definitively identified. Wall thickening in the cecum could reflect colitis or secondary inflammation related to the pelvic process. Perihepatic and perisplenic ascites. Possible trace loculated right pleural effusion peripherally in the right lower hemithorax. Electronically Signed   By: Charlett Nose M.D.   On: 02/04/2022 22:29   US PELVIC COMPLETE WITH TRANSVAGINAL  Addendum Date: 02/04/2022    ADDENDUM REPORT: 02/04/2022 13:44 ADDENDUM: In light of negative quantitative beta HCG and no documented pregnancy the possibility of infectious causes for the above findings may be more likely but findings are essentially quite nonspecific at this time. Even noncontrast CT could prove beneficial to allow for assessment of pelvic fluid to determine whether this represents frank hemoperitoneum and to assess for any associated inflammatory changes that may be present not well assessed on ultrasound. These results were called by telephone at the time of interpretation on 02/04/2022 at 1:44 pm to provider [facility] , who verbally acknowledged these results. Electronically Signed   By: Donzetta Kohut M.D.   On: 02/04/2022 13:44   Result Date: 02/04/2022 CLINICAL DATA:  A 31 year old female presents with pelvic pain and bleeding. Patient reports positive pregnancy but urine pregnancy currently negative by report and beta HCG is currently pending. EXAM: TRANSABDOMINAL AND TRANSVAGINAL ULTRASOUND OF PELVIS TECHNIQUE: Both transabdominal and transvaginal ultrasound examinations of the pelvis were performed. Transabdominal technique was performed for global imaging of the pelvis including uterus, ovaries, adnexal regions, and pelvic cul-de-sac. It was necessary to proceed with endovaginal exam following the transabdominal exam to visualize the endometrium and adnexa. COMPARISON:  No recent imaging is available for comparison. FINDINGS: Uterus Measurements: 12.6 x 8.0 x 8.5 (volume = 450 cc) cm. = volume: 450 mL. Heterogeneous uterus with thickened endometrium. Cystic area in the endometrial canal that measures 4.5 x 2.6 x 4.4 cm. This cystic characteristics and central increased echogenicity. Central increased echogenicity the without discrete finding that would suggest embryo with very limited assessment due to patient discomfort and clinical condition. Endometrium Thickness: 3.3 cm. Thickened with cystic area in the  fundus of the uterus. Right ovary Measurements: 3.2 x 2.7 x 2.1 (volume = 9.5 cc) cm = volume: 9.5 mL. Normal appearance/no adnexal mass. Left ovary Not visible, in the area of the LEFT adnexa is heterogeneous material that has the appearance  of blood products but conforms to the cul-de-sac and LEFT adnexa. Other findings Moderate heterogeneous fluid in the LEFT adnexa primarily but also tracking along the RIGHT uterus. IMPRESSION: Unusual cystic area in the endometrial canal. If this patient has any history of recent positive pregnancy test this could represent a failed gestation with retained products of conception. Pseudo gestational sac with areas of internal hemorrhage in the setting of ruptured ectopic pregnancy is also considered. Heterogeneous appearance of material in the cul-de-sac could also potentially represent sequela of pyogenic infection though this is lower on the differential at this time based on appearance. If the patient could tolerate further evaluation with cross-sectional imaging CT could potentially be helpful. Ultimately gyn consultation is suggested. Critical Value/emergent results were called by telephone at the time of interpretation on 02/04/2022 at 11:01 am to provider [place] [facility] , who verbally acknowledged these results. Electronically Signed: By: Donzetta Kohut M.D. On: 02/04/2022 11:01   DG Chest Port 1 View  Result Date: 02/04/2022 CLINICAL DATA:  31 year old female with history of lower abdominal pain and vaginal bleeding 3 days ago. Fever. Low blood pressure. EXAM: PORTABLE CHEST 1 VIEW COMPARISON:  Chest x-ray 08/09/2021. FINDINGS: Lung volumes are slightly low. No consolidative airspace disease. No pleural effusions. No pneumothorax. No evidence of pulmonary edema. Heart size is normal. The patient is rotated to the right on today's exam, resulting in distortion of the mediastinal contours and reduced diagnostic sensitivity and specificity for mediastinal pathology.  IMPRESSION: 1. No radiographic evidence of acute cardiopulmonary disease. Electronically Signed   By: Trudie Reed M.D.   On: 02/04/2022 10:55    Scheduled Meds:  Chlorhexidine Gluconate Cloth  6 each Topical Daily   doxycycline  100 mg Oral Q12H   enoxaparin (LOVENOX) injection  40 mg Subcutaneous Q24H   feeding supplement  237 mL Oral BID BM   feeding supplement (PROSource TF)  45 mL Per Tube BID   gabapentin  300 mg Oral TID   ketorolac  10 mg Oral Q6H   lidocaine  1 patch Transdermal Daily   linezolid  600 mg Per Tube Q12H   methocarbamol  1,000 mg Oral Q6H   metroNIDAZOLE  500 mg Per Tube Q12H   multivitamin  15 mL Per Tube QHS   nicotine  21 mg Transdermal Daily   oxyCODONE  20 mg Oral Q12H   polyethylene glycol  17 g Per NG tube BID   senna-docusate  1 tablet Oral QHS   sodium chloride flush  10-40 mL Intracatheter Q12H   traZODone  50 mg Per NG tube QHS   Continuous Infusions:  cefTRIAXone (ROCEPHIN)  IV 2 g (02/22/22 2208)   feeding supplement (OSMOLITE 1.5 CAL) 1,000 mL (02/22/22 0957)   PRN Meds:bisacodyl, [DISCONTINUED] diphenhydrAMINE **OR** diphenhydrAMINE, hydrOXYzine, lip balm, menthol-cetylpyridinium, ondansetron **OR** ondansetron (ZOFRAN) IV, phenol, polyvinyl alcohol, prochlorperazine, simethicone, sodium chloride flush    LOS: 14 days   Paula Collins, MD 02/23/2022 11:16 AM

## 2022-02-23 NOTE — Progress Notes (Signed)
HD#14 Subjective:   Summary: Pt is a 31yo woman with prior tubo-ovarian abscess, admitted 6/15-6/18 with septic shock due to recurrent tubo-ovarian abscess then left AMA, who represented on 6/19 with sepsis secondary to multiple pelvic abscesses. She underwent ex lap with washout with OB & General Surgery, and has JP drains still in place.  Overnight Events: Patient refused medications overnight, took off O2 mask, threatened to leave AMA if not given pain medication. Received dilaudid 0.5 mg IV.    Patient states she choked on her antibiotic last night. Denies feeling short of breath. Was able to eat cereal but not been having much fluid intake. Endorses stooling and voiding well. She states that she lays in bed in pain, not getting enough pain medicine. Reports pain related to wound vac placement.  Objective:  Vital signs in last 24 hours: Vitals:   02/23/22 0000 02/23/22 0311 02/23/22 0800 02/23/22 1200  BP: 122/78 116/76 110/74 110/75  Pulse: (!) 112 (!) 112 (!) 105 (!) 108  Resp: (!) 22 20 20  (!) 22  Temp:  98.8 F (37.1 C) 98.7 F (37.1 C) 98.7 F (37.1 C)  TempSrc:  Axillary Axillary Axillary  SpO2: 100% 100% 98% 90%  Weight:      Height:       Supplemental O2: Venturi Mask SpO2: 90 % O2 Flow Rate (L/min): 8 L/min   Physical Exam:  Constitutional: well-appearing female sitting in hospital bed, O2 mask in place HENT: normocephalic atraumatic, mucous membranes moist Eyes: conjunctiva non-erythematous Neck: supple Cardiovascular: regular rate and rhythm Pulmonary/Chest: normal work of breathing on room air Abdomen: diffuse tenderness, wound vac in place and intact Neurological: alert & oriented x 3 Skin: warm and dry   Filed Weights   02/16/22 1003 02/21/22 0500 02/22/22 0429  Weight: 76.9 kg 83.6 kg 81.8 kg     Intake/Output Summary (Last 24 hours) at 02/23/2022 1459 Last data filed at 02/23/2022 1201 Gross per 24 hour  Intake 2024 ml  Output 45 ml  Net 1979  ml   Net IO Since Admission: 10,650.96 mL [02/23/22 1459]  Pertinent Labs:    Latest Ref Rng & Units 02/23/2022    5:47 AM 02/22/2022    2:55 AM 02/22/2022    1:01 AM  CBC  WBC 4.0 - 10.5 K/uL 9.2  9.9  10.3   Hemoglobin 12.0 - 15.0 g/dL 8.1  7.7  8.0   Hematocrit 36.0 - 46.0 % 25.7  25.4  24.7   Platelets 150 - 400 K/uL 459  528  501        Latest Ref Rng & Units 02/23/2022    5:47 AM 02/22/2022    2:55 AM 02/21/2022    4:13 AM  CMP  Glucose 70 - 99 mg/dL 99  04/24/2022  161   BUN 6 - 20 mg/dL 10  7  7    Creatinine 0.44 - 1.00 mg/dL 096   0.45   Sodium 135 - 145 mmol/L 136  137  139   Potassium 3.5 - 5.1 mmol/L 3.8  3.5  3.3   Chloride 98 - 111 mmol/L 102  102  100   CO2 22 - 32 mmol/L 29  29  32   Calcium 8.9 - 10.3 mg/dL 8.5  8.1  8.3   Total Protein 6.5 - 8.1 g/dL 6.9     Total Bilirubin 0.3 - 1.2 mg/dL 0.2     Alkaline Phos 38 - 126 U/L 48  AST 15 - 41 U/L 32     ALT 0 - 44 U/L 14       Imaging: No results found.  Assessment/Plan:   Principal Problem:   Pelvic abscess  Active Problems:   Sepsis (HCC)   MRSA (methicillin resistant staph aureus) culture positive   Hepatic abscess   Hx of Fitz-Hugh-Curtis syndrome   Protein-calorie malnutrition (HCC)   Depression   Patient Summary: Paula Lucas is a 31 y.o. with past medical history of recent admission for tubo-ovarian abscess complicated by septic shock (02/04/22 - 02/07/22) with previous history of tubo-ovarian abscess 2/2 N. Gonorrhea in 2019 who presented to the ED with c/o nausea, vomiting and abdominal pain, currently admitted for multiple pelvic abscesses s/p wash out with ex lap and JP drains placement.  # Resolved sepsis 2/2 multiple pelvic abscesses, with ongoing abdominal pain S/p ex lap with washout and bowel adhesion lysis. Appears to be clinically improving, although pain control has been difficult. Wound vac placed at midline wound. Transitioning pain regimen to PO. Afebrile, leukocytes wnl.  Abdominal pain improving. -continue doxycycline for 2 weeks post-op -continue rocephin and flagyl for 3 weeks post-op-ID following, appreciate recs -pain managed with Toradol q6 hours, Gabapentin, Oxycontin 20mg  BID, and Dilaudid 2 mg q6 PO prn.  -continue working with PT/OT -cortrak in place for nutrition, working on PO intake.  -miralax, senna-s qhs, and dulcolax suppository prn   #MRSA bacturia Urine cultures positive for MRSA. ID feels this is from transient bacteremia. TTE negative for endocarditis and thoracic MRI negative for infection. -transitioned to zyvox per ID -She has a PICC line placed   #Acute hypoxic respiratory failure 2/2 volume overload & atelectasis CTPA negative for PE but notable for moderate bilateral pleural effusions and atelectasis. No infectious sxs. SpO2 in low 90s on RA this morning, improved from previous O2 supplementation.  -Lasix 40 mg again today -supplemental O2 prn -Encouraged PT and incentive spirometry   #Malnutrition S/P Coretrack placement with ongoing tube feeds.  -Advance diet as tolerated -Consider d/c coretrack once improved PO intake   Diet:  Reg + CoreTrack IVF: None, VTE:  Lovenox Code: Full  MD Internal Medicine Resident PGY-1 Please contact the on call pager after 5 pm and on weekends at [phone].

## 2022-02-23 NOTE — Consult Note (Addendum)
WOC Nurse Consult Note: Consult received from Dr. Macon Large to re-evaluate for NPWT. [place] and TC to bedside RN to coordinate a time that she is agreeable to have the Vac placed. Will follow-up and attempt to place the NPWT once the RN lets me know.   10:20  WOC Nurse Consult Note: Patient receiving care in [facility] [phone] Reason for Consult: wound vac initiation Wound type: LQ midline abdominal incision Pressure Injury POA: NA Measurement: 8.3 cm x 5.8 cm 4.2 cm Wound bed: pink,yellow fatty globules. Sutures visible at the greatest depth Drainage (amount, consistency, odor) serosanguinous Periwound: intact Dressing procedure/placement/frequency: W/D dressing removed. 1 piece mepitel and 1 piece black foam placed in the wound. Drape applied, immediate suction obtained at 125 mmHg continuous.   WOC will follow M/W/F. Supplies ordered to be placed in patient room. 4 small black foam, 4 mepitel.  WOC will change vac dressing again on Friday.    Renaldo Reel Katrinka Blazing, MSN, RN, CMSRN, Angus Seller, [facility] Wound Treatment Associate Pager [phone]

## 2022-02-24 LAB — GLUCOSE, CAPILLARY
Glucose-Capillary: 128 mg/dL — ABNORMAL HIGH (ref 70–99)
Glucose-Capillary: 124 mg/dL — ABNORMAL HIGH (ref 70–99)
Glucose-Capillary: 110 mg/dL — ABNORMAL HIGH (ref 70–99)
Glucose-Capillary: 118 mg/dL — ABNORMAL HIGH (ref 70–99)
Glucose-Capillary: 113 mg/dL — ABNORMAL HIGH (ref 70–99)
Glucose-Capillary: 109 mg/dL — ABNORMAL HIGH (ref 70–99)

## 2022-02-24 LAB — CBC WITH DIFFERENTIAL/PLATELET
Abs Immature Granulocytes: 0.05 10*3/uL (ref 0.00–0.07)
Basophils Absolute: 0 10*3/uL (ref 0.0–0.1)
Basophils Relative: 0 %
Eosinophils Absolute: 0.2 10*3/uL (ref 0.0–0.5)
Eosinophils Relative: 2 %
HCT: 26.8 % — ABNORMAL LOW (ref 36.0–46.0)
Hemoglobin: 8.5 g/dL — ABNORMAL LOW (ref 12.0–15.0)
Immature Granulocytes: 1 %
Lymphocytes Relative: 33 %
Lymphs Abs: 3.3 10*3/uL (ref 0.7–4.0)
MCH: 29.1 pg (ref 26.0–34.0)
MCHC: 31.7 g/dL (ref 30.0–36.0)
MCV: 91.8 fL (ref 80.0–100.0)
Monocytes Absolute: 0.8 10*3/uL (ref 0.1–1.0)
Monocytes Relative: 8 %
Neutro Abs: 5.7 10*3/uL (ref 1.7–7.7)
Neutrophils Relative %: 56 %
Platelets: 456 10*3/uL — ABNORMAL HIGH (ref 150–400)
RBC: 2.92 MIL/uL — ABNORMAL LOW (ref 3.87–5.11)
RDW: 19.5 % — ABNORMAL HIGH (ref 11.5–15.5)
WBC: 10.1 10*3/uL (ref 4.0–10.5)
nRBC: 0 % (ref 0.0–0.2)

## 2022-02-24 LAB — BASIC METABOLIC PANEL
Anion gap: 8 (ref 5–15)
BUN: 10 mg/dL (ref 6–20)
CO2: 27 mmol/L (ref 22–32)
Calcium: 8.4 mg/dL — ABNORMAL LOW (ref 8.9–10.3)
Chloride: 98 mmol/L (ref 98–111)
Creatinine, Ser: 0.68 mg/dL (ref 0.44–1.00)
GFR, Estimated: >60 mL/min (ref >60)
Glucose, Bld: 115 mg/dL — ABNORMAL HIGH (ref 70–99)
Potassium: 3.9 mmol/L (ref 3.5–5.1)
Sodium: 133 mmol/L — ABNORMAL LOW (ref 135–145)

## 2022-02-24 MED ORDER — FUROSEMIDE 10 MG/ML IJ SOLN
40.0000 mg | Freq: Once | INTRAMUSCULAR | Status: AC
  Administered 2022-02-24: 40 mg via INTRAVENOUS
  Filled 2022-02-24: qty 4

## 2022-02-24 MED ORDER — PANTOPRAZOLE SODIUM 40 MG PO TBEC
40.0000 mg | DELAYED_RELEASE_TABLET | Freq: Every day | ORAL | Status: DC
Start: 2022-02-24 — End: 2022-02-28
  Administered 2022-02-24 – 2022-02-25 (×2): 40 mg via ORAL
  Filled 2022-02-24 (×4): qty 1

## 2022-02-24 MED ORDER — HYDROMORPHONE HCL 1 MG/ML IJ SOLN
0.5000 mg | Freq: Once | INTRAMUSCULAR | Status: AC | PRN
  Administered 2022-02-24: 0.5 mg via INTRAVENOUS
  Filled 2022-02-24: qty 0.5

## 2022-02-24 NOTE — Progress Notes (Signed)
HD#15 Subjective:   Summary: Summary: Pt is a 31yo woman with prior tubo-ovarian abscess, admitted 6/15-6/18 with septic shock due to recurrent tubo-ovarian abscess then left AMA, who represented on 6/19 with sepsis secondary to multiple pelvic abscesses. She underwent ex lap with washout with OB & General Surgery, and has JP drains still in place.  Overnight Events: NAEO.  Patient continues to endorse pain at wound vac site. She has been unable to get out of bed despite needing to void/stool. She feels that her PO pain medications do not treat her pain as well as IV meds. She continues to endorse difficulty eating but is trying to improve. Feels it will be much easier once Coretrak comes out. She is amenable to taking pills crushed up in pudding.  Objective:  Vital signs in last 24 hours: Vitals:   02/23/22 1627 02/23/22 2059 02/23/22 2310 02/24/22 0403  BP: 109/79 (!) 124/92 120/84 109/65  Pulse: (!) 110 (!) 114 (!) 116 (!) 111  Resp: 20 20 (!) 22 (!) 23  Temp: 98.1 F (36.7 C) 98.5 F (36.9 C) 98.4 F (36.9 C) 98.5 F (36.9 C)  TempSrc: Oral Oral Oral Oral  SpO2: 90% 90% 90% 91%  Weight:      Height:       Supplemental O2: Room Air   Physical Exam:  Constitutional: no acute distress, on RA HENT: normocephalic atraumatic, mucous membranes moist Eyes: conjunctiva non-erythematous Neck: supple CV: no lower leg pitting edema Pulmonary/Chest: normal work of breathing on room air Neurological: alert & oriented x 3 Skin: warm and dry  Filed Weights   02/16/22 1003 02/21/22 0500 02/22/22 0429  Weight: 76.9 kg 83.6 kg 81.8 kg     Intake/Output Summary (Last 24 hours) at 02/24/2022 0641 Last data filed at 02/24/2022 0600 Gross per 24 hour  Intake 570 ml  Output 2090 ml  Net -1520 ml   Net IO Since Admission: 8,680.96 mL [02/24/22 0641]  Pertinent Labs:    Latest Ref Rng & Units 02/24/2022    5:23 AM 02/23/2022    5:47 AM 02/22/2022    2:55 AM  CBC  WBC 4.0 - 10.5  K/uL 10.1  9.2  9.9   Hemoglobin 12.0 - 15.0 g/dL 8.5  8.1  7.7   Hematocrit 36.0 - 46.0 % 26.8  25.7  25.4   Platelets 150 - 400 K/uL 456  459  528        Latest Ref Rng & Units 02/24/2022    5:23 AM 02/23/2022    5:47 AM 02/22/2022    2:55 AM  CMP  Glucose 70 - 99 mg/dL 673  99  419   BUN 6 - 20 mg/dL 10  10  7    Creatinine 0.44 - 1.00 mg/dL  3.79  0.24   Sodium 135 - 145 mmol/L 133  136  137   Potassium 3.5 - 5.1 mmol/L 3.9  3.8  3.5   Chloride 98 - 111 mmol/L 98  102  102   CO2 22 - 32 mmol/L 27  29  29    Calcium 8.9 - 10.3 mg/dL 8.4  8.5  8.1   Total Protein 6.5 - 8.1 g/dL  6.9    Total Bilirubin 0.3 - 1.2 mg/dL  0.2    Alkaline Phos 38 - 126 U/L  48    AST 15 - 41 U/L  32    ALT 0 - 44 U/L  14      Imaging:  No results found.  Assessment/Plan:   Principal Problem:   Pelvic abscess  Active Problems:   Sepsis (HCC)   MRSA (methicillin resistant staph aureus) culture positive   Hepatic abscess   Hx of Fitz-Hugh-Curtis syndrome   Protein-calorie malnutrition (HCC)   Depression   Patient Summary: Paula Lucas is a 31 y.o. with past medical history of recent admission for tubo-ovarian abscess complicated by septic shock (02/04/22 - 02/07/22) with previous history of tubo-ovarian abscess 2/2 N. Gonorrhea in 2019 who presented to the ED with c/o nausea, vomiting and abdominal pain, currently admitted for multiple pelvic abscesses s/p wash out with ex lap and JP drains placement.   # Resolved sepsis 2/2 multiple pelvic abscesses, with ongoing abdominal pain S/p ex lap with washout and bowel adhesion lysis. Appears to be clinically improving, although pain control has been difficult. Transitioned to PO pain regimen yesterday. Wound vac placed at midline. Remains afebrile, no leukocytosis. She continues to endorse pain at site of wound vac.  -continue doxycycline for 2 weeks post op -continue rocephin and flagyl for 3 weeks post op -pain regimen: Toradol 10 mg q6 PO,  Gabapentin 300 TID, Oxycontin 20mg  BID PO, and Dilaudid 2 mg q6 PO prn.  -will provide one dose dilaudid 0.5 mg IV to enable her to get OOB today for stooling/voiding -Continue working with PT/OT - Cortrak in place, encourage PO intake and remove coretrak in the next few days -miralax, senna-s qhs, and dulcolax suppository   #MRSA bacturia Urine cultures positive for MRSA. ID feels this was from transient bacteremia. TTE negative for endocarditis and thoracic MRI negative for osteo.  -ID following -transitioned to Zyvox -PICC line in place   #Acute hypoxic respiratory failure 2/2 volume overload & atelectasis CTPA negative for PE but notable for moderate bilateral pleural effusions and atelectasis. No infectious symptoms and satting 90% on RA today. Responded well to Lasix 40 yesterday. No lower leg edema on exam. Continues to endorse some pain with inspiration. -Lasix 40 mg IV again today -supplemental O2 prn -Continue to encourage PT, OOB, incentive spirometry   #Malnutrition Coretrak in place with ongoing tube feeds. -Advance diet as tolerated -Consider d/c coretrak once PO intake improves. Patient previously refused abx due to pill size but will crush now and give in food (ok'd by pharmacy)   Diet:  Reg + CoreTrack IVF: None, VTE:  Lovenox Code: Full  MD Internal Medicine Resident PGY-1 Please contact the on call pager after 5 pm and on weekends at [phone].

## 2022-02-24 NOTE — Progress Notes (Signed)
Occupational Therapy Treatment Patient Details Name: Paula Lucas MRN: [id number] DOB: 26-Dec-1990 Today's Date: 02/24/2022   History of present illness Pt is a 31 y/o female admitted 6/19 secondary to worsening abdominal pain. Pt left AMA on 6/18 from ICU for pelvic abscess and had drain placement during that admission. Pt reports she was [redacted] weeks pregnant at that time. Found to have tuboovarian abscess and pelvic abscess. Pt is s/p second drain placement and exchange and repositioning of first drain. Cortrak placed 6/30. Abdominal wound vac placed 7/4. PMH includes STIs.   OT comments  Pt with increased abdominal pain, needing more assistance for bed mobility. Pt asking to use BSC, but unable to tolerate standing, requesting to return to supine. Completed bed level grooming. HR in 120s.    Recommendations for follow up therapy are one component of a multi-disciplinary discharge planning process, led by the attending physician.  Recommendations may be updated based on patient status, additional functional criteria and insurance authorization.    Follow Up Recommendations  Skilled nursing-short term rehab (<3 hours/day)    Assistance Recommended at Discharge Frequent or constant Supervision/Assistance  Patient can return home with the following  A little help with walking and/or transfers;A lot of help with bathing/dressing/bathroom;Assistance with cooking/housework;Direct supervision/assist for medications management;Assist for transportation;Help with stairs or ramp for entrance   Equipment Recommendations  BSC/3in1;Tub/shower seat    Recommendations for Other Services      Precautions / Restrictions Precautions Precautions: Fall Precaution Comments: Contact; cortrak, abd x2 drains, wound vac: watch HR       Mobility Bed Mobility Overal bed mobility: Needs Assistance Bed Mobility: Rolling, Sidelying to Sit, Sit to Sidelying Rolling: Min assist Sidelying to sit: Min assist      Sit to sidelying: Min assist General bed mobility comments: cues for technique, use of bed rail    Transfers                   General transfer comment: pt unable to tolerate transfer to [facility] due to pain     Balance Overall balance assessment: Needs assistance   Sitting balance-Leahy Scale: Fair                                     ADL either performed or assessed with clinical judgement   ADL Overall ADL's : Needs assistance/impaired     Grooming: Wash/dry hands;Wash/dry face;Bed level;Set up                                      Extremity/Trunk Assessment              Vision       Perception     Praxis      Cognition Arousal/Alertness: Awake/alert Behavior During Therapy: Flat affect Overall Cognitive Status: Within Functional Limits for tasks assessed                                 General Comments: anxiety increases with pain        Exercises      Shoulder Instructions       General Comments      Pertinent Vitals/ Pain       Pain Assessment Pain Assessment: Faces Faces Pain Scale: Hurts  whole lot Pain Location: abdomen Pain Descriptors / Indicators: Grimacing, Moaning, Constant, Operative site guarding Pain Intervention(s): Monitored during session, Repositioned, Limited activity within patient's tolerance  Home Living                                          Prior Functioning/Environment              Frequency  Min 2X/week        Progress Toward Goals  OT Goals(current goals can now be found in the care plan section)  Progress towards OT goals: Not progressing toward goals - comment (pain)  Acute Rehab OT Goals OT Goal Formulation: With patient Time For Goal Achievement: 02/27/22 Potential to Achieve Goals: Fair  Plan Discharge plan remains appropriate    Co-evaluation                 AM-PAC OT "6 Clicks" Daily Activity     Outcome  Measure   Help from another person eating meals?: A Little Help from another person taking care of personal grooming?: A Little Help from another person toileting, which includes using toliet, bedpan, or urinal?: A Lot Help from another person bathing (including washing, rinsing, drying)?: A Lot Help from another person to put on and taking off regular upper body clothing?: A Little Help from another person to put on and taking off regular lower body clothing?: A Lot 6 Click Score: 15    End of Session    OT Visit Diagnosis: Unsteadiness on feet (R26.81);Other abnormalities of gait and mobility (R26.89);Muscle weakness (generalized) (M62.81)   Activity Tolerance Patient limited by pain (HR in 120s)   Patient Left in bed;with call bell/phone within reach;with bed alarm set   Nurse Communication          Time: 1020-1035 OT Time Calculation (min): 15 min  Charges: OT General Charges $OT Visit: 1 Visit OT Treatments $Self Care/Home Management : 8-22 mins  Berna Spare, OTR/L Acute Rehabilitation Services Office: [phone]   Evern Bio 02/24/2022, 11:35 AM

## 2022-02-24 NOTE — Progress Notes (Signed)
SATURATION QUALIFICATIONS: (This note is used to comply with regulatory documentation for home oxygen)  Patient Saturations on Room Air at Rest = 92%  Patient Saturations on Room Air while Ambulating = 84%  Patient Saturations on 2 Liters of oxygen while Ambulating = 90%  Please briefly explain why patient needs home oxygen: Pt hypoxic on RA with exertion, currently needing at least 2L/min O2 to maintain SpO2 >88%, RN notified.

## 2022-02-24 NOTE — Progress Notes (Signed)
Physical Therapy Treatment Patient Details Name: Paula Lucas MRN: [id number] DOB: Jul 12, 1991 Today's Date: 02/24/2022   History of Present Illness Pt is a 31 y/o female admitted 6/19 secondary to worsening abdominal pain. Pt left AMA on 6/18 from ICU for pelvic abscess and had drain placement during that admission. Pt reports she was [redacted] weeks pregnant at that time. Found to have tuboovarian abscess and pelvic abscess. Pt is s/p second drain placement and exchange and repositioning of first drain. Cortrak placed 6/30. Abdominal wound vac placed 7/4. PMH includes STIs.    PT Comments    Pt received in supine, agreeable to therapy session after premedication with IV meds with goal of progressing to hallway gait trial. Pt limited due to bowel urgency, hypoxia/diaphoretic with exertion on RA and tachycardia. Pt needing 2L O2 via venturi mask (due to cortrak in nares) to maintain SpO2 >88% with exertion. Pt pulling on IS. Pt needing increased time/cues for safety, line mgmt and activity pacing with exertion and reminders for abdominal protection precaution. Pt needing up to minA to perform all functional mobility tasks, using RW for standing balance. Pt reports her plan is to stay with her mother at [place]. Pt continues to benefit from PT services to progress toward functional mobility goals.   Recommendations for follow up therapy are one component of a multi-disciplinary discharge planning process, led by the attending physician.  Recommendations may be updated based on patient status, additional functional criteria and insurance authorization.  Follow Up Recommendations  Home health PT (pending progress)     Assistance Recommended at Discharge Intermittent Supervision/Assistance  Patient can return home with the following Help with stairs or ramp for entrance;Assist for transportation;Assistance with cooking/housework;A little help with bathing/dressing/bathroom;A little help with walking and/or  transfers   Equipment Recommendations  Rolling walker (2 wheels);BSC/3in1    Recommendations for Other Services       Precautions / Restrictions Precautions Precautions: Fall Precaution Comments: Contact; cortrak, abd x2 drains, wound vac: watch HR/O2 Restrictions Weight Bearing Restrictions: No     Mobility  Bed Mobility Overal bed mobility: Needs Assistance Bed Mobility: Rolling, Sidelying to Sit, Sit to Sidelying Rolling: Min assist Sidelying to sit: Min assist     Sit to sidelying: Min assist General bed mobility comments: cues for technique, use of bed rail, pad assist to reach full sidelying and advance hips; minA to lift BLE over EOB    Transfers Overall transfer level: Needs assistance Equipment used: Rolling walker (2 wheels) Transfers: Sit to/from Stand Sit to Stand: Min assist, Min guard           General transfer comment: minA for stand>sit safety to/from lower toilet height, min guard from bed height<>RW    Ambulation/Gait Ambulation/Gait assistance: Min guard Gait Distance (Feet): 20 Feet (19ft to bathroom toilet, seated break ~10 mins, then 65ft in room, then ~40ft toward [facility]) Assistive device: Rolling walker (2 wheels) Gait Pattern/deviations: Step-through pattern, Trunk flexed Gait velocity: Decreased     General Gait Details: Trunk slightly flexed throughout secondary to pain. Min guard for safety/line mgmt, cues for pursed-lip breathing and activity pacing; SpO2 desat to 84% with exertion, improves with seated breath/PLB. On second trial, SpO2 again desat so placed on 2L resting and improved to >92% on 2L while seated EOB and taking sidesteps toward [facility]   Stairs             Wheelchair Mobility    Modified Rankin (Stroke Patients Only)  Balance Overall balance assessment: Needs assistance Sitting-balance support: No upper extremity supported, Feet supported Sitting balance-Leahy Scale: Fair Sitting balance - Comments: able  to reach down to don/doff socks and wash legs while seated on toilet no LOB   Standing balance support: Bilateral upper extremity supported Standing balance-Leahy Scale: Poor Standing balance comment: improved stability using RW for dynamic tasks; able to static stand with U UE support but more anxious while not holding RW                Cognition Arousal/Alertness: Awake/alert Behavior During Therapy: Anxious Overall Cognitive Status: Within Functional Limits for tasks assessed               General Comments: anxiety increases with pain; pt motivated today to progress mobility, given instruction about slow/safe progression given pale pallor, hypoxia and tachycardia. Pt encouraged to consider OOB to chair next date with nsg staff to prepare for hallway ambulation on Fri if PT cannot come Thurs.        Exercises Other Exercises Other Exercises: verbal review for supine HEP, pt reports compliance    General Comments General comments (skin integrity, edema, etc.): see gait details; tachy to 141 bpm with exertion; hypoxic with exertion on RA      Pertinent Vitals/Pain Pain Assessment Pain Assessment: Faces Faces Pain Scale: Hurts whole lot Pain Location: R lungs/ribcage Pain Descriptors / Indicators: Grimacing, Moaning, Constant, Guarding Pain Intervention(s): Limited activity within patient's tolerance, Monitored during session, Premedicated before session, Repositioned, Utilized relaxation techniques     PT Goals (current goals can now be found in the care plan section) Acute Rehab PT Goals Patient Stated Goal: short term: to decrease pain, to ambulate in hallway PT Goal Formulation: With patient Time For Goal Achievement: 02/27/22 Progress towards PT goals: Progressing toward goals    Frequency    Min 3X/week      PT Plan Current plan remains appropriate       AM-PAC PT "6 Clicks" Mobility   Outcome Measure  Help needed turning from your back to your  side while in a flat bed without using bedrails?: A Little Help needed moving from lying on your back to sitting on the side of a flat bed without using bedrails?: A Little Help needed moving to and from a bed to a chair (including a wheelchair)?: A Little Help needed standing up from a chair using your arms (e.g., wheelchair or bedside chair)?: A Little Help needed to walk in hospital room?: A Lot (mod cues) Help needed climbing 3-5 steps with a railing? : Total 6 Click Score: 15    End of Session Equipment Utilized During Treatment: Oxygen Activity Tolerance: Patient tolerated treatment well;Treatment limited secondary to medical complications (Comment) (gait distance limited due to tachycardia/hypoxia on RA) Patient left: in bed;with call bell/phone within reach;with bed alarm set;Other (comment) (HOB locked at 30*) Nurse Communication: Mobility status;Other (comment) (HOB locked at 30*; pt refusing to eat lunch; tachy/hypoxic with exertion; needs to get OOB to chair with nsg staff tmrw) PT Visit Diagnosis: Other abnormalities of gait and mobility (R26.89);Pain;Muscle weakness (generalized) (M62.81);Difficulty in walking, not elsewhere classified (R26.2) Pain - Right/Left: Right Pain - part of body:  (ribcage/"lung" and surgical site at abd)     Time: 8295-6213 PT Time Calculation (min) (ACUTE ONLY): 56 min  Charges:  $Gait Training: 23-37 mins $Therapeutic Activity: 23-37 mins  Houston Siren., PTA Acute Rehabilitation Services Secure Chat Preferred 9a-5:30pm Office: [place] 02/24/2022, 3:37 PM

## 2022-02-24 NOTE — Progress Notes (Signed)
Patient had a temp of 99.4. No prn available due to allergies. MD aware, encouraged to monitor and recheck within 1 hr. Most current temperature 99.2 and HR 120's-130. Patient asymptomatic, 94 on RA. MD on call paged. Orders to continue to monitor and stated she would be rounded on and assessed shortly. Patient appears comfortable. Will continue to monitor.

## 2022-02-24 NOTE — Progress Notes (Signed)
Patient ID: Paula Lucas, female   DOB: 1990-10-08, 31 y.o.   MRN: [id number]  Assessment/Plan: Principal Problem:   Pelvic abscess  Active Problems:   Sepsis (HCC)   MRSA (methicillin resistant staph aureus) culture positive   Hepatic abscess   Hx of Fitz-Hugh-Curtis syndrome   Protein-calorie malnutrition (HCC)   Depression  Pelvic abscess/Hepatic abscess Patient has multiple abscesses in her abdomen and pelvis related to prior PID with current GC and Chlamydia negative. Patient now s/p ex lap with pelvic and abdominal wash out on 02/16/22 Wound vac placed 02/23/22 by WOC Nurse, appreciate their help and ongoing care for this patient. Cultures from surgery are sterile. Her WBC is in the normal range.  She is afebrile.  Reports mild improvement in her abdominal pain. Positive flatus.  Continue Linezolid, Rocephin, Doxycycline, Flagyl--appreciate ID input Continue pain medications as needed If further surgery is planned, would need GYN ONC consult.  Continued chest pain/pulmonary issues CT angio chest negative for PR, but with bilateral pleural effusions, and near complete atelectasis if left lower lobe. X-ray findings consistent with fluid overload.   Continued pain is likely due to work with breathing and aforementioned findings. On Lasix as per primary team, will continue this as prescribed  Continue oxygen as needed, recommend incentive spirometry, ambulation Will defer decisions about further imaging to primary team  Depression Recommend starting antidepressant, discussed with team TOC consult placed  Severe protein calorie malnutrition She is now status post core track placement with tube feeds ongoing.  Continue advancing her diet.   Sepsis [facility])  She is not currently actively septic Thoracic MRI and TTE negative for infection.   MRSA (methicillin resistant staph aureus) culture positive On Linezolid   Iron deficiency anemia No active bleeding, s/p  Venofer  Appreciate care of Paula Lucas by her primary team.  Will continue to follow along. Please call [phone] [facility] OB/GYN Consult Attending Monday-Friday 8am - 5pm) or [phone] [facility] OB/GYN Attending On Call all day, every day) for any gynecologic concerns at any time.  Thank you for involving [place] in the care of this patient.  Jaynie Collins, MD, FACOG Obstetrician & Gynecologist, [facility] for [employer], [facility]    Subjective: Interval History: Still complains of chest pain and some abdominal pain. Does not like being rounded on when she is trying to rest "why are you all always coming in here?"  Objective: Vital signs in last 24 hours: Temp:  [98.1 F (36.7 C)-98.7 F (37.1 C)] 98.7 F (37.1 C) (07/05 0826) Pulse Rate:  [97-116] 97 (07/05 0826) Resp:  [20-23] 20 (07/05 0826) BP: (109-124)/(65-92) 114/72 (07/05 0826) SpO2:  [90 %-92 %] 92 % (07/05 0826)  Intake/Output from previous day: 07/04 0701 - 07/05 0700 In: 570 [P.O.:50] Out: 2090 [Urine:2050; Drains:40] Intake/Output this shift: No intake/output data recorded.  Physical exam General appearance: alert, cooperative, and appears stated age Head: Normocephalic, without obvious abnormality, atraumatic Lungs: Normal effort Heart: regular rate, regular rhythm Abdomen: less distended, diffusely tender to palpation, JP drains in place Incision: Wound vac in place, intact and attached to suction Extremities: Homans sign is negative, no sign of DVT Skin: Skin color, texture, turgor normal. No rashes or lesions Neurologic: Grossly normal  Results for orders placed or performed during the hospital encounter of 02/08/22 (from the past 24 hour(s))  Glucose, capillary     Status: Abnormal   Collection Time: 02/23/22 11:56 AM  Result Value Ref Range  Glucose-Capillary 132 (H) 70 - 99 mg/dL  Glucose, capillary     Status: Abnormal   Collection Time: 02/23/22  4:27 PM   Result Value Ref Range   Glucose-Capillary 103 (H) 70 - 99 mg/dL  Glucose, capillary     Status: Abnormal   Collection Time: 02/23/22  9:00 PM  Result Value Ref Range   Glucose-Capillary 102 (H) 70 - 99 mg/dL  Glucose, capillary     Status: Abnormal   Collection Time: 02/24/22  1:06 AM  Result Value Ref Range   Glucose-Capillary 128 (H) 70 - 99 mg/dL  Glucose, capillary     Status: Abnormal   Collection Time: 02/24/22  4:07 AM  Result Value Ref Range   Glucose-Capillary 124 (H) 70 - 99 mg/dL  CBC with Differential/Platelet     Status: Abnormal   Collection Time: 02/24/22  5:23 AM  Result Value Ref Range   WBC 10.1 4.0 - 10.5 K/uL   RBC 2.92 (L) 3.87 - 5.11 MIL/uL   Hemoglobin 8.5 (L) 12.0 - 15.0 g/dL   HCT 16.1 (L) 09.6 - 04.5 %   MCV 91.8 80.0 - 100.0 fL   MCH 29.1 26.0 - 34.0 pg   MCHC 31.7 30.0 - 36.0 g/dL   RDW 40.9 (H) 81.1 - 91.4 %   Platelets 456 (H) 150 - 400 K/uL   nRBC 0.0 0.0 - 0.2 %   Neutrophils Relative % 56 %   Neutro Abs 5.7 1.7 - 7.7 K/uL   Lymphocytes Relative 33 %   Lymphs Abs 3.3 0.7 - 4.0 K/uL   Monocytes Relative 8 %   Monocytes Absolute 0.8 0.1 - 1.0 K/uL   Eosinophils Relative 2 %   Eosinophils Absolute 0.2 0.0 - 0.5 K/uL   Basophils Relative 0 %   Basophils Absolute 0.0 0.0 - 0.1 K/uL   WBC Morphology MORPHOLOGY UNREMARKABLE    RBC Morphology MORPHOLOGY UNREMARKABLE    Smear Review Reviewed    Immature Granulocytes 1 %   Abs Immature Granulocytes 0.05 0.00 - 0.07 K/uL  Basic metabolic panel     Status: Abnormal   Collection Time: 02/24/22  5:23 AM  Result Value Ref Range   Sodium 133 (L) 135 - 145 mmol/L   Potassium 3.9 3.5 - 5.1 mmol/L   Chloride 98 98 - 111 mmol/L   CO2 27 22 - 32 mmol/L   Glucose, Bld 115 (H) 70 - 99 mg/dL   BUN 10 6 - 20 mg/dL   Creatinine, Ser 7.82 0.44 - 1.00 mg/dL   Calcium 8.4 (L) 8.9 - 10.3 mg/dL   GFR, Estimated >95 >62 mL/min   Anion gap 8 5 - 15    Studies/Results: CT Angio Chest Pulmonary Embolism (PE)  W or WO Contrast  Result Date: 02/22/2022 CLINICAL DATA:  Pulmonary embolism (PE) suspected, high prob Acute hypoxic respiratory failure, postsurgical. EXAM: CT ANGIOGRAPHY CHEST WITH CONTRAST TECHNIQUE: Multidetector CT imaging of the chest was performed using the standard protocol during bolus administration of intravenous contrast. Multiplanar CT image reconstructions and MIPs were obtained to evaluate the vascular anatomy. RADIATION DOSE REDUCTION: This exam was performed according to the departmental dose-optimization program which includes automated exposure control, adjustment of the mA and/or kV according to patient size and/or use of iterative reconstruction technique. CONTRAST:  69mL OMNIPAQUE IOHEXOL 350 MG/ML SOLN COMPARISON:  Chest radiograph yesterday. Thoracic spine MRI yesterday. Lung bases from recent abdominal CT reviewed. FINDINGS: Cardiovascular: There are no filling defects within the pulmonary arteries to  suggest pulmonary embolus. No aortic dissection or acute aortic findings. Common origin of brachiocephalic and left common carotid artery, variant arch anatomy. The heart is normal in size. Trace pericardial fluid anteriorly. Mediastinum/Nodes: Feeding tube decompresses the esophagus. No mediastinal adenopathy. No bulky hilar adenopathy. No suspicious axillary adenopathy. Lungs/Pleura: Moderate bilateral pleural effusions. There is associated compressive atelectasis. This includes near complete atelectasis of the left lower lobe. No features of pulmonary edema. The trachea and central bronchi are patent. Upper Abdomen: Small amount of perihepatic fluid adjacent to the anterior right hepatic lobe, also seen on prior abdominal CT, series 6, image 89, not significantly changed. Musculoskeletal: There are no acute or suspicious osseous abnormalities. Mild body wall edema. Review of the MIP images confirms the above findings. IMPRESSION: 1. No pulmonary embolus. 2. Moderate bilateral pleural  effusions with associated compressive atelectasis. This includes near complete atelectasis of the left lower lobe. 3. Small amount of perihepatic fluid, also seen on prior abdominal CT. This is not well assessed on the current exam due to phase of contrast. Electronically Signed   By: Narda RutherfordMelanie  Sanford M.D.   On: 02/22/2022 00:50   DG CHEST PORT 1 VIEW  Result Date: 02/21/2022 CLINICAL DATA:  Dyspnea EXAM: PORTABLE CHEST 1 VIEW COMPARISON:  02/20/2022 FINDINGS: Feeding catheter is noted extending into the stomach. Right-sided PICC is noted in satisfactory position. Cardiac shadow is stable. Persistent left retrocardiac density is noted. Mild right basilar atelectasis is noted as well. Small effusions are seen. Stable vascular congestion is noted. IMPRESSION: Overall appearance of the chest is stable from the prior exam. Electronically Signed   By: Alcide CleverMark  Lukens M.D.   On: 02/21/2022 23:12   MR THORACIC SPINE W WO CONTRAST  Result Date: 02/21/2022 CLINICAL DATA:  31 year old female. History of MRSA, PID. Pelvic and abdominal abscesses. EXAM: MRI THORACIC WITHOUT AND WITH CONTRAST TECHNIQUE: Multiplanar and multiecho pulse sequences of the thoracic spine were obtained without and with intravenous contrast. CONTRAST:  7.345mL GADAVIST GADOBUTROL 1 MMOL/ML IV SOLN COMPARISON:  Lumbar MRI 02/12/2022. CT Abdomen and Pelvis 02/14/2022. FINDINGS: Limited cervical spine imaging: Negative aside from mild straightening and reversal of cervical lordosis. Thoracic spine segmentation: Appears to be normal and concordant with the lumbar numbering last month. Alignment: Maintained thoracic vertebral height and alignment. Relatively normal kyphosis. Vertebrae: No marrow edema or evidence of acute osseous abnormality. Somewhat generalized decreased T1 marrow signal in the visible spine and ribs may reflect red marrow reactivation. Cord: No abnormal intradural enhancement or dural thickening. No intraspinal fluid collection is  evident. Thoracic spinal cord signal and morphology are within normal limits. Paraspinal and other soft tissues: Bilateral pleural effusions and compressed lung parenchyma similar to the recent CT Abdomen and Pelvis. Negative visible kidneys. Thoracic paraspinal soft tissues remain within normal limits. Thoracic epidural space is within normal limits. Disc levels: Maintained normal thoracic intervertebral disc signal and morphology throughout. There is mild bilateral facet hypertrophy at T9-T10. No thoracic spinal stenosis. No significant thoracic neural foraminal stenosis. IMPRESSION: 1. No evidence of thoracic spinal infection. Normal thoracic discs. No thoracic spinal stenosis or significant neural impingement. 2. Bilateral pleural effusions and compressed lung parenchyma similar to the recent CT Abdomen and Pelvis. Electronically Signed   By: Odessa FlemingH  Hall M.D.   On: 02/21/2022 10:15   DG CHEST PORT 1 VIEW  Result Date: 02/20/2022 CLINICAL DATA:  Shortness of breath EXAM: PORTABLE CHEST - 1 VIEW COMPARISON:  02/18/2022 FINDINGS: Unchanged mild cardiomegaly and mild pulmonary vascular congestion. Bibasilar opacities  likely combination of small pleural effusions with adjacent atelectasis. Feeding tube terminates in the left upper quadrant. Right upper extremity PICC terminates at the cavoatrial junction. IMPRESSION: 1. Mild cardiomegaly. 2. Mild pulmonary vascular congestion. 3. Small bilateral pleural effusions with adjacent atelectasis. Electronically Signed   By: Acquanetta Belling M.D.   On: 02/20/2022 09:30   DG Abd Portable 1V  Result Date: 02/19/2022 CLINICAL DATA:  Provided history: Encounter for feeding tube placement. EXAM: PORTABLE ABDOMEN - 1 VIEW COMPARISON:  Abdominal radiograph 02/18/2022. FINDINGS: An enteric tube passes below the level left hemidiaphragm with tip projecting in the expected location of the pylorus or first portion of the duodenum. Nonobstructive bowel gas pattern within the relies  abdomen. No acute bony abnormality identified. IMPRESSION: Enteric tube present with tip projecting in the expected location of the pylorus or first portion of the duodenum. Nonspecific, nonobstructive bowel gas pattern within the visualized abdomen. Electronically Signed   By: Jackey Loge D.O.   On: 02/19/2022 14:20   DG CHEST PORT 1 VIEW  Result Date: 02/18/2022 CLINICAL DATA:  Central line placement EXAM: PORTABLE CHEST 1 VIEW COMPARISON:  Radiograph 09/06/2021 FINDINGS: Right upper extremity PICC tip crosses the midline, across the brachiocephalic confluence, tip overlies the left brachiocephalic vein. Borderline enlarged cardiac silhouette. Bibasilar consolidations, left greater than right. Small bilateral pleural effusions. Low lung volumes. No pneumothorax. No acute osseous abnormality. IMPRESSION: Right upper extremity PICC tip courses across the brachiocephalic confluence, tip overlying the left brachiocephalic vein. Recommend repositioning. Bilateral pleural effusions with adjacent bibasilar atelectasis, left greater than right. Low lung volumes. These results will be called to the ordering clinician or representative by the Radiologist Assistant, and communication documented in the PACS or [employer]. Electronically Signed   By: Caprice Renshaw M.D.   On: 02/18/2022 18:18   [place] EKG SITE RITE  Result Date: 02/18/2022 If Site Rite image not attached, placement could not be confirmed due to current cardiac rhythm.  DG Abd Portable 1V  Result Date: 02/18/2022 CLINICAL DATA:  Abdominal distension. EXAM: PORTABLE ABDOMEN - 1 VIEW COMPARISON:  Abdominal CT February 14, 2022 FINDINGS: Drainage catheters again seen in the lower abdomen/pelvis. No significant change in the appearance of the bowel pattern. No evidence of small-bowel obstruction. IMPRESSION: Stable appearance of the bowel pattern. Electronically Signed   By: Ted Mcalpine M.D.   On: 02/18/2022 12:52   CT ABDOMEN PELVIS W  CONTRAST  Result Date: 02/14/2022 CLINICAL DATA:  Follow-up intra-abdominal abscesses. EXAM: CT ABDOMEN AND PELVIS WITH CONTRAST TECHNIQUE: Multidetector CT imaging of the abdomen and pelvis was performed using the standard protocol following bolus administration of intravenous contrast. RADIATION DOSE REDUCTION: This exam was performed according to the departmental dose-optimization program which includes automated exposure control, adjustment of the mA and/or kV according to patient size and/or use of iterative reconstruction technique. CONTRAST:  OMNIPAQUE IOHEXOL 300 MG/ML  SOLN COMPARISON:  CT scan 02/08/2022 FINDINGS: Lower chest: Persistent moderate-sized pleural effusions with overlying atelectasis. Hepatobiliary: Small residual lentiform rim enhancing abscess at the dome of the liver anteriorly measuring a maximum of 5.6 x 1.1 cm. This previously measured approximately 9.3 x 1.1 cm. Inferiorly there is a persistent perihepatic abscess. It measures approximately 9.5 x 3.1 cm and previously measured 10.1 x 3.7 cm. No worrisome hepatic lesions or intrahepatic biliary dilatation. The gallbladder is unremarkable. No common bile duct dilatation. Pancreas: No mass, inflammation or ductal dilatation. Spleen: Normal size.  No focal lesions. Adrenals/Urinary Tract: Adrenal glands and kidneys  are unremarkable and stable. The bladder is grossly normal. Stomach/Bowel: The stomach, duodenum, small bowel and colon grossly normal. Vascular/Lymphatic: The aorta and branch vessels are patent. The major venous structures are patent. Stable borderline enlarged mesenteric and retroperitoneal lymph nodes, likely reactive/inflammatory. Reproductive: The uterus and ovaries are stable. Other: Percutaneous drainage catheter in the upper right pelvis with a persistent rim enhancing abscess measuring approximately 8.8 x 3.5 cm and previously measuring 9.8 x 5.6 cm. A second drainage catheter in the lower right cul-de-sac area  with a small persistent rim enhancing abscess measuring 4.1 x 2.0 cm. This previously measured 5.2 x 4.9 cm. Left-sided rounded pelvic abscess measures 3.6 x 2.9 cm on image 71/3. This previously measured 3.5 x 2.7 cm. Persistent diffuse mesenteric and subcutaneous inflammations/edema. Musculoskeletal: No significant bony findings. IMPRESSION: 1. Persistent moderate-sized pleural effusions with overlying atelectasis. 2. Persistent but slightly smaller undrained perihepatic abscesses. 3. Two right-sided pelvic drainage catheters with persistent but smaller surrounding abscesses. 4. Left-sided undrained pelvic abscess is slightly smaller also. 5. Persistent diffuse mesenteric and subcutaneous inflammation. 6. Stable borderline enlarged mesenteric and retroperitoneal lymph nodes, likely reactive/inflammatory. Electronically Signed   By: Rudie Meyer M.D.   On: 02/14/2022 14:46   MR Lumbar Spine W Wo Contrast  Result Date: 02/13/2022 CLINICAL DATA:  Initial evaluation for back pain, history of MRSA bacteremia. EXAM: MRI LUMBAR SPINE WITHOUT AND WITH CONTRAST TECHNIQUE: Multiplanar and multiecho pulse sequences of the lumbar spine were obtained without and with intravenous contrast. CONTRAST:  7.25mL GADAVIST GADOBUTROL 1 MMOL/ML IV SOLN COMPARISON:  Prior CT from 02/08/2022. FINDINGS: Segmentation: Standard. Lowest well-formed disc space labeled the L5-S1 level. Alignment: Mild levoscoliosis with straightening of the normal lumbar lordosis. No listhesis. Vertebrae: Vertebral body height maintained without acute or chronic fracture. Bone marrow signal intensity diffusely decreased on T1 weighted sequence, nonspecific, but most commonly related to anemia, smoking or obesity. No discrete or worrisome osseous lesions. No evidence for osteomyelitis discitis or septic arthritis within the lumbar spine. Conus medullaris and cauda equina: Conus extends to the L1-2 level. Conus and cauda equina appear normal. Paraspinal and  other soft tissues: Paraspinous soft tissues demonstrate no acute finding. Multi loculated collections/abscesses with surrounding enhancement partially seen within the partially visualized pelvis, better characterized on recent CT. Remainder of the visualized visceral structures otherwise unremarkable. Disc levels: L1-2:  Unremarkable. L2-3: Small left subarticular disc protrusion mildly indents the left ventral thecal sac (series 12, image 16). Borderline mild narrowing of the left lateral recess. Central canal remains widely patent. No foraminal stenosis. L3-4:  Unremarkable. L4-5:  Unremarkable. L5-S1: Left subarticular disc protrusion extends into the left lateral recess, contacting the descending left S1 nerve root (series 12, image 34). Resultant mild left lateral recess stenosis. Central canal remains patent. No significant foraminal stenosis. IMPRESSION: 1. No MRI evidence for acute infection within the lumbar spine. 2. Left subarticular disc protrusion at L5-S1, contacting the descending left S1 nerve root in the left lateral recess. 3. Small left subarticular disc protrusion at L2-3 with borderline mild left lateral recess stenosis. 4. Multiloculated collections/abscesses within the partially visualized pelvis, better characterized on recent CT. Electronically Signed   By: Rise Mu M.D.   On: 02/13/2022 04:04   ECHOCARDIOGRAM COMPLETE  Result Date: 02/12/2022    ECHOCARDIOGRAM REPORT   Patient Name:   Paula Lucas Date of Exam: 02/12/2022 Medical Rec #:  [id number]        Height:       63.0 in Accession #:  [id number]       Weight:       169.5 lb Date of Birth:  1991-07-06        BSA:          1.802 m Patient Age:    31 years         BP:           113/87 mmHg Patient Gender: F                HR:           98 bpm. Exam Location:  Inpatient Procedure: 2D Echo, Cardiac Doppler and Color Doppler Indications:   Staph aureus infection  History:       Patient has no prior history of  Echocardiogram examinations.                Sepsis.  Sonographer:   Ross Ludwig RDCS (AE) Referring      TRUNG T VU Phys: IMPRESSIONS  1. Left ventricular ejection fraction, by estimation, is 60 to 65%. The left ventricle has normal function. The left ventricle has no regional wall motion abnormalities. Left ventricular diastolic parameters were normal.  2. Right ventricular systolic function is normal. The right ventricular size is normal.  3. The mitral valve is normal in structure. Trivial mitral valve regurgitation. No evidence of mitral stenosis.  4. The aortic valve is normal in structure. Aortic valve regurgitation is not visualized. No aortic stenosis is present. FINDINGS  Left Ventricle: Left ventricular ejection fraction, by estimation, is 60 to 65%. The left ventricle has normal function. The left ventricle has no regional wall motion abnormalities. The left ventricular internal cavity size was normal in size. There is  no left ventricular hypertrophy. Left ventricular diastolic parameters were normal. Right Ventricle: The right ventricular size is normal. Right vetricular wall thickness was not well visualized. Right ventricular systolic function is normal. Left Atrium: Left atrial size was normal in size. Right Atrium: Right atrial size was normal in size. Pericardium: There is no evidence of pericardial effusion. Mitral Valve: The mitral valve is normal in structure. Trivial mitral valve regurgitation. No evidence of mitral valve stenosis. Tricuspid Valve: The tricuspid valve is normal in structure. Tricuspid valve regurgitation is trivial. Aortic Valve: The aortic valve is normal in structure. Aortic valve regurgitation is not visualized. No aortic stenosis is present. Aortic valve mean gradient measures 4.0 mmHg. Aortic valve peak gradient measures 7.1 mmHg. Aortic valve area, by VTI measures 2.25 cm. Pulmonic Valve: The pulmonic valve was normal in structure. Pulmonic valve regurgitation is not  visualized. Aorta: The aortic root and ascending aorta are structurally normal, with no evidence of dilitation. IAS/Shunts: The atrial septum is grossly normal.  LEFT VENTRICLE PLAX 2D LVIDd:         4.80 cm LVIDs:         3.40 cm LV PW:         0.90 cm LV IVS:        1.10 cm LVOT diam:     1.90 cm LV SV:         49 LV SV Index:   27 LVOT Area:     2.84 cm  RIGHT VENTRICLE             IVC RV Basal diam:  2.30 cm     IVC diam: 1.50 cm RV S prime:     12.40 cm/s TAPSE (M-mode): 2.1 cm LEFT ATRIUM  Index        RIGHT ATRIUM          Index LA diam:        3.80 cm 2.11 cm/m   RA Area:     9.13 cm LA Vol (A2C):   26.4 ml 14.65 ml/m  RA Volume:   15.80 ml 8.77 ml/m LA Vol (A4C):   29.7 ml 16.48 ml/m LA Biplane Vol: 29.3 ml 16.26 ml/m  AORTIC VALVE AV Area (Vmax):    2.24 cm AV Area (Vmean):   2.21 cm AV Area (VTI):     2.25 cm AV Vmax:           133.00 cm/s AV Vmean:          87.900 cm/s AV VTI:            0.218 m AV Peak Grad:      7.1 mmHg AV Mean Grad:      4.0 mmHg LVOT Vmax:         105.00 cm/s LVOT Vmean:        68.600 cm/s LVOT VTI:          0.173 m LVOT/AV VTI ratio: 0.79  AORTA Ao Root diam: 2.90 cm Ao Asc diam:  2.70 cm  SHUNTS Systemic VTI:  0.17 m Systemic Diam: 1.90 cm Kristeen Miss MD Electronically signed by Kristeen Miss MD Signature Date/Time: 02/12/2022/4:39:15 PM    Final    VAS [place] LOWER EXTREMITY VENOUS (DVT)  Result Date: 02/12/2022  Lower Venous DVT Study Patient Name:  Paula Lucas  Date of Exam:   02/12/2022 Medical Rec #: [id number]         Accession #:    [id number] Date of Birth: [birth year]         Patient Gender: F Patient Age:   69 years Exam Location:  [facility] Procedure:      VAS [place] LOWER EXTREMITY VENOUS (DVT) Referring Phys: JULIE MACHEN --------------------------------------------------------------------------------  Indications: Pain.  Risk Factors: None identified. Limitations: Poor patient cooperation, patient pain tolerance. Comparison Study: No  prior studies. Performing Technologist: Chanda Busing RVT  Examination Guidelines: A complete evaluation includes B-mode imaging, spectral Doppler, color Doppler, and power Doppler as needed of all accessible portions of each vessel. Bilateral testing is considered an integral part of a complete examination. Limited examinations for reoccurring indications may be performed as noted. The reflux portion of the exam is performed with the patient in reverse Trendelenburg.  +---------+---------------+---------+-----------+----------+--------------+ RIGHT    CompressibilityPhasicitySpontaneityPropertiesThrombus Aging +---------+---------------+---------+-----------+----------+--------------+ CFV      Full           Yes      Yes                                 +---------+---------------+---------+-----------+----------+--------------+ SFJ      Full                                                        +---------+---------------+---------+-----------+----------+--------------+ FV Prox  Full                                                        +---------+---------------+---------+-----------+----------+--------------+  FV Mid   Full                                                        +---------+---------------+---------+-----------+----------+--------------+ FV DistalFull                                                        +---------+---------------+---------+-----------+----------+--------------+ PFV      Full                                                        +---------+---------------+---------+-----------+----------+--------------+ POP      Full           Yes      Yes                                 +---------+---------------+---------+-----------+----------+--------------+ PTV      Full                                                        +---------+---------------+---------+-----------+----------+--------------+ PERO     Full                                                         +---------+---------------+---------+-----------+----------+--------------+   +----+---------------+---------+-----------+----------+--------------+ LEFTCompressibilityPhasicitySpontaneityPropertiesThrombus Aging +----+---------------+---------+-----------+----------+--------------+ CFV Full           Yes      Yes                                 +----+---------------+---------+-----------+----------+--------------+    Summary: RIGHT: - There is no evidence of deep vein thrombosis in the lower extremity.  - No cystic structure found in the popliteal fossa.  LEFT: - No evidence of common femoral vein obstruction.  *See table(s) above for measurements and observations. Electronically signed by Sherald Hess MD on 02/12/2022 at 11:23:51 AM.    Final    CT IMAGE GUIDED DRAINAGE BY PERCUTANEOUS CATHETER  Result Date: 02/10/2022 INDICATION: History of tubo-ovarian abscess, post CT-guided placement right lower abdominal/pelvic drainage catheter on 02/05/2022. Postprocedural CT scan performed 02/08/2022 demonstrates an unchanged fluid collection within the right lower pelvis and as such request made for placement of an additional percutaneous drainage catheter for infection source control purposes. Additionally, there is request for CT-guided exchange and/or repositioning of the pre-existing drainage catheter into the more dominant component of the residual pelvic abscess EXAM: 1. CT GUIDED RIGHT TRANS GLUTEAL APPROACH TUBO-OVARIAN ABSCESS DRAINAGE CATHETER PLACEMENT 2. CT-GUIDED EXCHANGE AND REPOSITIONING OF EXISTING RIGHT LOWER ABDOMINAL/PELVIC DRAINAGE CATHETER COMPARISON:  CT abdomen pelvis-02/08/2022;  02/04/2022 CT guided right lower abdominal/pelvic abscess drainage catheter placement-02/05/2022 MEDICATIONS: The patient is currently admitted to the hospital and receiving intravenous antibiotics. The antibiotics were administered within an appropriate  time frame prior to the initiation of the procedure. ANESTHESIA/SEDATION: Moderate (conscious) sedation was employed during this procedure as administered by the Interventional Radiology RN. A total of Versed 4 mg, Dilaudid 1 mg and Fentanyl 150 mcg was administered intravenously. Moderate Sedation Time: 60 minutes. The patient's level of consciousness and vital signs were monitored continuously by radiology nursing throughout the procedure under my direct supervision. CONTRAST:  None COMPLICATIONS: None immediate. PROCEDURE: RADIATION DOSE REDUCTION: This exam was performed according to the departmental dose-optimization program which includes automated exposure control, adjustment of the mA and/or kV according to patient size and/or use of iterative reconstruction technique. Informed written consent was obtained from the patient after a discussion of the risks, benefits and alternatives to treatment. The patient was initially placed prone on the CT gantry and a pre procedural CT was performed re-demonstrating the known abscess/fluid collection within the right hemipelvis with dominant component measuring approximately 5.5 x 5.4 cm (image 11, series 2). The procedure was planned. A timeout was performed prior to the initiation of the procedure. The skin overlying the right buttocks was prepped and draped in the usual sterile fashion. The overlying soft tissues were anesthetized with 1% lidocaine with epinephrine. Appropriate trajectory was planned with the use of a 22 gauge spinal needle. An 18 gauge trocar needle was advanced into the abscess/fluid collection and a short Amplatz super stiff wire was coiled within the collection. Appropriate positioning was confirmed with a limited CT scan. The tract was serially dilated allowing placement of a 10 [place] all-purpose drainage catheter. Appropriate positioning was confirmed with a limited postprocedural CT scan. Approximately 65 ml of purulent appearing serous fluid  was aspirated. The tube was connected to a drainage bag and secured in place within interrupted suture and a Stat Lock device. A dressing was applied. The patient was then repositioned supine on the CT gantry and noncontrast images were obtained of the lower pelvis and pre-existing right anterior approach lower abdominal/pelvic drainage catheter. Attempts were made to retract the existing drainage catheter into the dominant component of the residual collection however this proved uncomfortable for the patient. As such, the external portion of the drainage catheter was prepped and draped in the usual sterile fashion. The external portion of the drainage catheter was cut and cannulated with a short Amplatz wire. Existing 12 French drainage catheter was then exchanged for a new 12 French percutaneous catheter which under intermittent CT guidance was repositioned into the dominant residual component of the right lower quadrant collection (series 11). The drainage catheter was connected to a gravity bag and secured in place within interrupted suture and a Stat Lock device. Patient tolerated the above procedures well (though did require a generous amount of conscious sedation medications) without immediate postprocedural complication. IMPRESSION: 1. Successful CT guided placement of a 10 [place] all purpose drain catheter into the undrained pelvic abscess via right trans gluteal approach with aspiration of 65 mL of purulent fluid. Samples were sent to the laboratory as requested by the ordering clinical team. 2. Successful CT guided exchange and repositioning of pre-existing 12 French right anterior abdominal/pelvic drainage catheter with end now coiled and locked within the residual anteriorly located abscess cavity. Electronically Signed   By: Simonne Come M.D.   On: 02/10/2022 16:33   CT ABSCESS CATH EXCHANGE  Result  Date: 02/10/2022 INDICATION: History of tubo-ovarian abscess, post CT-guided placement right lower  abdominal/pelvic drainage catheter on 02/05/2022. Postprocedural CT scan performed 02/08/2022 demonstrates an unchanged fluid collection within the right lower pelvis and as such request made for placement of an additional percutaneous drainage catheter for infection source control purposes. Additionally, there is request for CT-guided exchange and/or repositioning of the pre-existing drainage catheter into the more dominant component of the residual pelvic abscess EXAM: 1. CT GUIDED RIGHT TRANS GLUTEAL APPROACH TUBO-OVARIAN ABSCESS DRAINAGE CATHETER PLACEMENT 2. CT-GUIDED EXCHANGE AND REPOSITIONING OF EXISTING RIGHT LOWER ABDOMINAL/PELVIC DRAINAGE CATHETER COMPARISON:  CT abdomen pelvis-02/08/2022; 02/04/2022 CT guided right lower abdominal/pelvic abscess drainage catheter placement-02/05/2022 MEDICATIONS: The patient is currently admitted to the hospital and receiving intravenous antibiotics. The antibiotics were administered within an appropriate time frame prior to the initiation of the procedure. ANESTHESIA/SEDATION: Moderate (conscious) sedation was employed during this procedure as administered by the Interventional Radiology RN. A total of Versed 4 mg, Dilaudid 1 mg and Fentanyl 150 mcg was administered intravenously. Moderate Sedation Time: 60 minutes. The patient's level of consciousness and vital signs were monitored continuously by radiology nursing throughout the procedure under my direct supervision. CONTRAST:  None COMPLICATIONS: None immediate. PROCEDURE: RADIATION DOSE REDUCTION: This exam was performed according to the departmental dose-optimization program which includes automated exposure control, adjustment of the mA and/or kV according to patient size and/or use of iterative reconstruction technique. Informed written consent was obtained from the patient after a discussion of the risks, benefits and alternatives to treatment. The patient was initially placed prone on the CT gantry and a pre  procedural CT was performed re-demonstrating the known abscess/fluid collection within the right hemipelvis with dominant component measuring approximately 5.5 x 5.4 cm (image 11, series 2). The procedure was planned. A timeout was performed prior to the initiation of the procedure. The skin overlying the right buttocks was prepped and draped in the usual sterile fashion. The overlying soft tissues were anesthetized with 1% lidocaine with epinephrine. Appropriate trajectory was planned with the use of a 22 gauge spinal needle. An 18 gauge trocar needle was advanced into the abscess/fluid collection and a short Amplatz super stiff wire was coiled within the collection. Appropriate positioning was confirmed with a limited CT scan. The tract was serially dilated allowing placement of a 10 [place] all-purpose drainage catheter. Appropriate positioning was confirmed with a limited postprocedural CT scan. Approximately 65 ml of purulent appearing serous fluid was aspirated. The tube was connected to a drainage bag and secured in place within interrupted suture and a Stat Lock device. A dressing was applied. The patient was then repositioned supine on the CT gantry and noncontrast images were obtained of the lower pelvis and pre-existing right anterior approach lower abdominal/pelvic drainage catheter. Attempts were made to retract the existing drainage catheter into the dominant component of the residual collection however this proved uncomfortable for the patient. As such, the external portion of the drainage catheter was prepped and draped in the usual sterile fashion. The external portion of the drainage catheter was cut and cannulated with a short Amplatz wire. Existing 12 French drainage catheter was then exchanged for a new 12 French percutaneous catheter which under intermittent CT guidance was repositioned into the dominant residual component of the right lower quadrant collection (series 11). The drainage catheter  was connected to a gravity bag and secured in place within interrupted suture and a Stat Lock device. Patient tolerated the above procedures well (though did require a generous  amount of conscious sedation medications) without immediate postprocedural complication. IMPRESSION: 1. Successful CT guided placement of a 10 [place] all purpose drain catheter into the undrained pelvic abscess via right trans gluteal approach with aspiration of 65 mL of purulent fluid. Samples were sent to the laboratory as requested by the ordering clinical team. 2. Successful CT guided exchange and repositioning of pre-existing 12 French right anterior abdominal/pelvic drainage catheter with end now coiled and locked within the residual anteriorly located abscess cavity. Electronically Signed   By: Simonne Come M.D.   On: 02/10/2022 16:33   CT ABDOMEN PELVIS W CONTRAST  Result Date: 02/08/2022 CLINICAL DATA:  Sepsis EXAM: CT ABDOMEN AND PELVIS WITH CONTRAST TECHNIQUE: Multidetector CT imaging of the abdomen and pelvis was performed using the standard protocol following bolus administration of intravenous contrast. RADIATION DOSE REDUCTION: This exam was performed according to the departmental dose-optimization program which includes automated exposure control, adjustment of the mA and/or kV according to patient size and/or use of iterative reconstruction technique. CONTRAST:  OMNIPAQUE IOHEXOL 300 MG/ML  SOLN COMPARISON:  Previous studies including the CT done on March 05, 2022 FINDINGS: Lower chest: Small bilateral pleural effusions are seen more so on the right side. There are linear patchy infiltrates in the lingula and both lower lobes suggesting atelectasis/pneumonia. Hepatobiliary: No focal abnormality is seen in the liver. There is no dilation of bile ducts. Gallbladder is unremarkable. Small ascites is noted in the perihepatic region. Pancreas: No focal abnormality is seen. Spleen: Unremarkable. Adrenals/Urinary Tract:  Adrenals are not enlarged. There is no hydronephrosis. There are no renal or ureteral stones. Urinary bladder is unremarkable. There is 3 mm low-density in the midportion of left kidney which is too small to be characterized. Stomach/Bowel: Stomach is not distended. Small bowel loops are not dilated. Appendix is not seen. There is no significant wall thickening in the colon. There is interval placement of percutaneous drainage catheter with its tip in the right lower abdomen. There is interval decrease in size of the complex fluid collection in the right lower quadrant. There is 4 cm loculated fluid collection slightly to the left of midline at the level of pelvic inlet. There is 7.2 x 5.3 x 4.9 cm loculated fluid collection with minimally enhancing margin in the cul-de-sac in the pelvis. There are no pockets of air within these loculated fluid collections. There is stranding in the fat planes in the lower abdomen and pelvis. Vascular/Lymphatic: Vascular structures are unremarkable. Reproductive: Uterus is to the right of midline. Other: There is no pneumoperitoneum. Musculoskeletal: No acute findings are seen. IMPRESSION: Interval placement of percutaneous drainage catheter within air-fluid collection in the right lower abdomen with interval decrease in size. There is 7.2 cm loculated fluid collection in the cul-de-sac in the pelvis with slightly enhancing margins. There is another 4 cm loculated fluid collection in the left side of the pelvis. These may suggest loculated serous fluid collections or abscesses. Small ascites is noted in the perihepatic region. There is no evidence of intestinal obstruction or pneumoperitoneum. There is no hydronephrosis. Small bilateral pleural effusions. There are linear patchy infiltrates in both lower lung fields suggesting atelectasis/pneumonia. Electronically Signed   By: Ernie Avena M.D.   On: 02/08/2022 09:41   CT IMAGE GUIDED DRAINAGE BY PERCUTANEOUS  CATHETER  Result Date: 02/05/2022 INDICATION: PID with large abdominopelvic fluid collection. EXAM: CT GUIDED DRAINAGE CATHETER PLACEMENT INTO ABDOMINOPELVIC FLUID COLLECTION RADIATION DOSE REDUCTION: This exam was performed according to the departmental dose-optimization program which includes automated  exposure control, adjustment of the mA and/or kV according to patient size and/or use of iterative reconstruction technique. COMPARISON:  CT AP, 02/04/2022 MEDICATIONS: The patient is currently admitted to the hospital and receiving intravenous antibiotics. The antibiotics were administered within an appropriate time frame prior to the initiation of the procedure. ANESTHESIA/SEDATION: Moderate (conscious) sedation was employed during this procedure. A total of Versed 3 mg and Fentanyl 75 mcg was administered intravenously. Additionally, 1 mg Dilaudid was administered IV. Moderate Sedation Time: 19 minutes. The patient's level of consciousness and vital signs were monitored continuously by radiology nursing throughout the procedure under my direct supervision. CONTRAST:  None COMPLICATIONS: None immediate. PROCEDURE: Informed written consent was obtained from the patient and/or patient's representative after a discussion of the risks, benefits and alternatives to treatment. The patient was placed supine on the CT gantry and a pre procedural CT was performed re-demonstrating the known abscess/fluid collection within the lower abdomen/pelvis. The procedure was planned. A timeout was performed prior to the initiation of the procedure. The RIGHT lower quadrant was prepped and draped in the usual sterile fashion. The overlying soft tissues were anesthetized with 1% lidocaine with epinephrine. Appropriate trajectory was planned with the use of a 22 gauge spinal needle. An 18 gauge trocar needle was advanced into the abscess/fluid collection and a short Amplatz super stiff wire was coiled within the collection.  Appropriate positioning was confirmed with a limited CT scan. The tract was serially dilated allowing placement of a 12 [place] all-purpose drainage catheter. Appropriate positioning was confirmed with a limited postprocedural CT scan. 5 mL ml of serous fluid was aspirated and submitted for analysis. The tube was connected to a drainage bag and sutured in place. A dressing was placed. The patient tolerated the procedure well without immediate post procedural complication. IMPRESSION: Successful CT guided placement of a 12 Fr drainage catheter into the abdominopelvic collection with aspiration of serous fluid, as above. Samples were sent to the laboratory as requested by the ordering clinical team. Roanna Banning, MD [facility] Rad[facility] Electronically Signed   By: Roanna Banning M.D.   On: 02/05/2022 18:08   CT ABDOMEN PELVIS WO CONTRAST  Result Date: 02/04/2022 CLINICAL DATA:  Abdominal pain, acute, nonlocalized septic shock, abnormal uterine US, concern for PID EXAM: CT ABDOMEN AND PELVIS WITHOUT CONTRAST TECHNIQUE: Multidetector CT imaging of the abdomen and pelvis was performed following the standard protocol without IV contrast. RADIATION DOSE REDUCTION: This exam was performed according to the departmental dose-optimization program which includes automated exposure control, adjustment of the mA and/or kV according to patient size and/or use of iterative reconstruction technique. COMPARISON:  Pelvic ultrasound today FINDINGS: Lower chest: Pleural thickening noted peripherally in the right lower hemithorax, possibly representing trace loculated pleural effusion. No confluent airspace opacities. Hepatobiliary: No focal hepatic abnormality. Gallbladder unremarkable. Pancreas: No focal abnormality or ductal dilatation. Spleen: No focal abnormality.  Normal size. Adrenals/Urinary Tract: No adrenal abnormality. No focal renal abnormality. No stones or hydronephrosis.  Urinary bladder is unremarkable. Stomach/Bowel: Wall thickening noted in the cecum which is immediately adjacent to the large pelvic fluid collection described below. This could reflect colitis or be secondarily inflamed. Stomach and small bowel grossly unremarkable. Vascular/Lymphatic: No evidence of aneurysm or adenopathy. Reproductive: Large complex cystic area or fluid collection seen in the pelvis extending from the cul-de-sac superiorly into the right lower quadrant adjacent to the cecum. This measures approximately 12 x 8 cm on image 64 of series 3. Internal areas  of increased density. This could reflect blood or abscess/PID. Uterus grossly unremarkable on this noncontrast study. Ovaries not definitively visualized. Other: There is free fluid adjacent to the liver and spleen. Musculoskeletal: No acute bony abnormality. IMPRESSION: Complex process in the pelvis including what appears to be a large complex fluid collection measuring 12 x 8 cm extending from the cul-de-sac superiorly to the cecum which could reflect blood products or abscess. This also could conceivably be related to and ovary. Neither ovary definitively identified. Wall thickening in the cecum could reflect colitis or secondary inflammation related to the pelvic process. Perihepatic and perisplenic ascites. Possible trace loculated right pleural effusion peripherally in the right lower hemithorax. Electronically Signed   By: Charlett Nose M.D.   On: 02/04/2022 22:29   US PELVIC COMPLETE WITH TRANSVAGINAL  Addendum Date: 02/04/2022   ADDENDUM REPORT: 02/04/2022 13:44 ADDENDUM: In light of negative quantitative beta HCG and no documented pregnancy the possibility of infectious causes for the above findings may be more likely but findings are essentially quite nonspecific at this time. Even noncontrast CT could prove beneficial to allow for assessment of pelvic fluid to determine whether this represents frank hemoperitoneum and to assess for  any associated inflammatory changes that may be present not well assessed on ultrasound. These results were called by telephone at the time of interpretation on 02/04/2022 at 1:44 pm to provider [facility] , who verbally acknowledged these results. Electronically Signed   By: Donzetta Kohut M.D.   On: 02/04/2022 13:44   Result Date: 02/04/2022 CLINICAL DATA:  A 31 year old female presents with pelvic pain and bleeding. Patient reports positive pregnancy but urine pregnancy currently negative by report and beta HCG is currently pending. EXAM: TRANSABDOMINAL AND TRANSVAGINAL ULTRASOUND OF PELVIS TECHNIQUE: Both transabdominal and transvaginal ultrasound examinations of the pelvis were performed. Transabdominal technique was performed for global imaging of the pelvis including uterus, ovaries, adnexal regions, and pelvic cul-de-sac. It was necessary to proceed with endovaginal exam following the transabdominal exam to visualize the endometrium and adnexa. COMPARISON:  No recent imaging is available for comparison. FINDINGS: Uterus Measurements: 12.6 x 8.0 x 8.5 (volume = 450 cc) cm. = volume: 450 mL. Heterogeneous uterus with thickened endometrium. Cystic area in the endometrial canal that measures 4.5 x 2.6 x 4.4 cm. This cystic characteristics and central increased echogenicity. Central increased echogenicity the without discrete finding that would suggest embryo with very limited assessment due to patient discomfort and clinical condition. Endometrium Thickness: 3.3 cm. Thickened with cystic area in the fundus of the uterus. Right ovary Measurements: 3.2 x 2.7 x 2.1 (volume = 9.5 cc) cm = volume: 9.5 mL. Normal appearance/no adnexal mass. Left ovary Not visible, in the area of the LEFT adnexa is heterogeneous material that has the appearance of blood products but conforms to the cul-de-sac and LEFT adnexa. Other findings Moderate heterogeneous fluid in the LEFT adnexa primarily but also tracking along the RIGHT  uterus. IMPRESSION: Unusual cystic area in the endometrial canal. If this patient has any history of recent positive pregnancy test this could represent a failed gestation with retained products of conception. Pseudo gestational sac with areas of internal hemorrhage in the setting of ruptured ectopic pregnancy is also considered. Heterogeneous appearance of material in the cul-de-sac could also potentially represent sequela of pyogenic infection though this is lower on the differential at this time based on appearance. If the patient could tolerate further evaluation with cross-sectional imaging CT could potentially be helpful. Ultimately gyn consultation is  suggested. Critical Value/emergent results were called by telephone at the time of interpretation on 02/04/2022 at 11:01 am to provider [facility] , who verbally acknowledged these results. Electronically Signed: By: Donzetta Kohut M.D. On: 02/04/2022 11:01   DG Chest Port 1 View  Result Date: 02/04/2022 CLINICAL DATA:  32 year old female with history of lower abdominal pain and vaginal bleeding 3 days ago. Fever. Low blood pressure. EXAM: PORTABLE CHEST 1 VIEW COMPARISON:  Chest x-ray 08/09/2021. FINDINGS: Lung volumes are slightly low. No consolidative airspace disease. No pleural effusions. No pneumothorax. No evidence of pulmonary edema. Heart size is normal. The patient is rotated to the right on today's exam, resulting in distortion of the mediastinal contours and reduced diagnostic sensitivity and specificity for mediastinal pathology. IMPRESSION: 1. No radiographic evidence of acute cardiopulmonary disease. Electronically Signed   By: Trudie Reed M.D.   On: 02/04/2022 10:55    Scheduled Meds:  Chlorhexidine Gluconate Cloth  6 each Topical Daily   doxycycline  100 mg Oral Q12H   enoxaparin (LOVENOX) injection  40 mg Subcutaneous Q24H   feeding supplement  237 mL Oral BID BM   feeding supplement (PROSource TF)  45 mL Per Tube BID    gabapentin  300 mg Oral TID   ketorolac  10 mg Oral Q6H   lidocaine  1 patch Transdermal Daily   linezolid  600 mg Per Tube Q12H   methocarbamol  1,000 mg Oral Q6H   metroNIDAZOLE  500 mg Per Tube Q12H   multivitamin  15 mL Per Tube QHS   nicotine  21 mg Transdermal Daily   oxyCODONE  20 mg Oral Q12H   polyethylene glycol  17 g Per NG tube BID   senna-docusate  1 tablet Oral QHS   sodium chloride flush  10-40 mL Intracatheter Q12H   traZODone  50 mg Per NG tube QHS   Continuous Infusions:  cefTRIAXone (ROCEPHIN)  IV 2 g (02/23/22 2230)   feeding supplement (OSMOLITE 1.5 CAL) 1,000 mL (02/23/22 1955)   PRN Meds:bisacodyl, [DISCONTINUED] diphenhydrAMINE **OR** diphenhydrAMINE, HYDROmorphone, hydrOXYzine, lip balm, menthol-cetylpyridinium, ondansetron **OR** ondansetron (ZOFRAN) IV, phenol, polyvinyl alcohol, prochlorperazine, simethicone, sodium chloride flush    LOS: 15 days   Jaynie Collins, MD 02/24/2022 8:56 AM

## 2022-02-25 ENCOUNTER — Inpatient Hospital Stay (HOSPITAL_COMMUNITY): Payer: Medicaid Other

## 2022-02-25 LAB — BASIC METABOLIC PANEL
Anion gap: 9 (ref 5–15)
BUN: 10 mg/dL (ref 6–20)
CO2: 28 mmol/L (ref 22–32)
Calcium: 8.7 mg/dL — ABNORMAL LOW (ref 8.9–10.3)
Chloride: 101 mmol/L (ref 98–111)
Creatinine, Ser: 0.75 mg/dL (ref 0.44–1.00)
GFR, Estimated: >60 mL/min (ref >60)
Glucose, Bld: 108 mg/dL — ABNORMAL HIGH (ref 70–99)
Potassium: 4.2 mmol/L (ref 3.5–5.1)
Sodium: 138 mmol/L (ref 135–145)

## 2022-02-25 LAB — CBC WITH DIFFERENTIAL/PLATELET
Abs Immature Granulocytes: 0.05 10*3/uL (ref 0.00–0.07)
Basophils Absolute: 0.1 10*3/uL (ref 0.0–0.1)
Basophils Relative: 1 %
Eosinophils Absolute: 0.2 10*3/uL (ref 0.0–0.5)
Eosinophils Relative: 2 %
HCT: 27.8 % — ABNORMAL LOW (ref 36.0–46.0)
Hemoglobin: 8.6 g/dL — ABNORMAL LOW (ref 12.0–15.0)
Immature Granulocytes: 0 %
Lymphocytes Relative: 30 %
Lymphs Abs: 3.3 10*3/uL (ref 0.7–4.0)
MCH: 28.9 pg (ref 26.0–34.0)
MCHC: 30.9 g/dL (ref 30.0–36.0)
MCV: 93.3 fL (ref 80.0–100.0)
Monocytes Absolute: 1.3 10*3/uL — ABNORMAL HIGH (ref 0.1–1.0)
Monocytes Relative: 11 %
Neutro Abs: 6.2 10*3/uL (ref 1.7–7.7)
Neutrophils Relative %: 56 %
Platelets: 464 10*3/uL — ABNORMAL HIGH (ref 150–400)
RBC: 2.98 MIL/uL — ABNORMAL LOW (ref 3.87–5.11)
RDW: 20.3 % — ABNORMAL HIGH (ref 11.5–15.5)
WBC: 11.2 10*3/uL — ABNORMAL HIGH (ref 4.0–10.5)
nRBC: 0 % (ref 0.0–0.2)

## 2022-02-25 LAB — GLUCOSE, CAPILLARY
Glucose-Capillary: 118 mg/dL — ABNORMAL HIGH (ref 70–99)
Glucose-Capillary: 131 mg/dL — ABNORMAL HIGH (ref 70–99)
Glucose-Capillary: 141 mg/dL — ABNORMAL HIGH (ref 70–99)
Glucose-Capillary: 143 mg/dL — ABNORMAL HIGH (ref 70–99)
Glucose-Capillary: 146 mg/dL — ABNORMAL HIGH (ref 70–99)

## 2022-02-25 MED ORDER — FUROSEMIDE 10 MG/ML IJ SOLN
40.0000 mg | Freq: Once | INTRAMUSCULAR | Status: AC
  Administered 2022-02-25: 40 mg via INTRAVENOUS
  Filled 2022-02-25: qty 4

## 2022-02-25 MED ORDER — ALTEPLASE 2 MG IJ SOLR
2.0000 mg | Freq: Once | INTRAMUSCULAR | Status: AC
  Administered 2022-02-26: 2 mg
  Filled 2022-02-25: qty 2

## 2022-02-25 MED ORDER — PROCHLORPERAZINE EDISYLATE 10 MG/2ML IJ SOLN
10.0000 mg | Freq: Once | INTRAMUSCULAR | Status: AC
  Administered 2022-02-25: 10 mg via INTRAVENOUS
  Filled 2022-02-25: qty 2

## 2022-02-25 MED ORDER — METRONIDAZOLE 500 MG PO TABS
500.0000 mg | ORAL_TABLET | Freq: Two times a day (BID) | ORAL | Status: DC
  Administered 2022-02-26 – 2022-02-28 (×4): 500 mg via ORAL
  Filled 2022-02-25 (×7): qty 1

## 2022-02-25 MED ORDER — ADULT MULTIVITAMIN W/MINERALS CH
1.0000 | ORAL_TABLET | Freq: Every day | ORAL | Status: DC
  Administered 2022-02-25: 1 via ORAL
  Filled 2022-02-25 (×3): qty 1

## 2022-02-25 MED ORDER — LINEZOLID 600 MG PO TABS
600.0000 mg | ORAL_TABLET | Freq: Two times a day (BID) | ORAL | Status: AC
Start: 2022-02-25 — End: 2022-02-26
  Filled 2022-02-25 (×2): qty 1

## 2022-02-25 MED ORDER — SIMETHICONE 80 MG PO CHEW: 80.0000 mg | CHEWABLE_TABLET | Freq: Four times a day (QID) | ORAL | Status: DC | PRN

## 2022-02-25 MED ORDER — TRAZODONE HCL 50 MG PO TABS
50.0000 mg | ORAL_TABLET | Freq: Every day | ORAL | Status: DC
  Administered 2022-02-26 – 2022-02-27 (×2): 50 mg via ORAL
  Filled 2022-02-25 (×3): qty 1

## 2022-02-25 MED ORDER — DIPHENHYDRAMINE HCL 50 MG/ML IJ SOLN
25.0000 mg | Freq: Once | INTRAMUSCULAR | Status: AC
  Administered 2022-02-25: 25 mg via INTRAVENOUS
  Filled 2022-02-25: qty 1

## 2022-02-25 MED ORDER — POLYETHYLENE GLYCOL 3350 17 G PO PACK
17.0000 g | PACK | Freq: Two times a day (BID) | ORAL | Status: DC
  Filled 2022-02-25 (×4): qty 1

## 2022-02-25 MED ORDER — BOOST / RESOURCE BREEZE PO LIQD CUSTOM
1.0000 | Freq: Three times a day (TID) | ORAL | Status: DC
  Administered 2022-02-25: 1 via ORAL

## 2022-02-25 MED ORDER — KETOROLAC TROMETHAMINE 30 MG/ML IJ SOLN
30.0000 mg | Freq: Once | INTRAMUSCULAR | Status: AC
  Administered 2022-02-26: 30 mg via INTRAVENOUS
  Filled 2022-02-25: qty 1

## 2022-02-25 NOTE — Progress Notes (Addendum)
HD#16 Subjective:   Summary: Summary: Summary: Pt is a 31yo woman with prior tubo-ovarian abscess, admitted 6/15-6/18 with septic shock due to recurrent tubo-ovarian abscess then left AMA, who represented on 6/19 with sepsis secondary to multiple pelvic abscesses. She underwent ex lap with washout with OB & General Surgery, and has JP drains still in place.  Overnight Events: NAEO.  Patient noting improvement today. She continues to have pain at site of wound vac but this has improved. She has been able to get out of bed to void/stool with current PO pain regimen. She continues to endorse pain with breathing. She is eager to have Coretrak removed. She endorses migraine headache.  Objective:  Vital signs in last 24 hours: Vitals:   02/25/22 0031 02/25/22 0432 02/25/22 0934 02/25/22 1339  BP: 115/71 117/67 104/73 115/68  Pulse: (!) 120 (!) 122 (!) 118 (!) 119  Resp: 20 18 19 20   Temp: 99.9 F (37.7 C) 99.4 F (37.4 C) 98.9 F (37.2 C) 98.4 F (36.9 C)  TempSrc: Oral Oral Oral Oral  SpO2: 90% 90% 90% 90%  Weight:      Height:       Supplemental O2: Room Air SpO2: 90 % O2 Flow Rate (L/min): 2 L/min   Physical Exam:  Constitutional: lying in hospital bed, appears in mild distress from headache HENT: normocephalic atraumatic, mucous membranes moist Eyes: conjunctiva non-erythematous Neck: supple CV: no lower leg edema Cardiovascular: Tachycardic. Regular rhythm. Pulmonary/Chest: normal work of breathing on room air Neurological: alert & oriented x 3 Skin: warm and dry   Filed Weights   02/16/22 1003 02/21/22 0500 02/22/22 0429  Weight: 76.9 kg 83.6 kg 81.8 kg     Intake/Output Summary (Last 24 hours) at 02/25/2022 1617 Last data filed at 02/25/2022 0615 Gross per 24 hour  Intake --  Output 1000 ml  Net -1000 ml   Net IO Since Admission: 7,680.96 mL [02/25/22 1617]  Pertinent Labs:    Latest Ref Rng & Units 02/25/2022    6:00 AM 02/24/2022    5:23 AM 02/23/2022     5:47 AM  CBC  WBC 4.0 - 10.5 K/uL 11.2  10.1  9.2   Hemoglobin 12.0 - 15.0 g/dL 8.6  8.5  8.1   Hematocrit 36.0 - 46.0 % 27.8  26.8  25.7   Platelets 150 - 400 K/uL 464  456  459        Latest Ref Rng & Units 02/25/2022    6:00 AM 02/24/2022    5:23 AM 02/23/2022    5:47 AM  CMP  Glucose 70 - 99 mg/dL 04/26/2022  643  99   BUN 6 - 20 mg/dL 10  10  10    Creatinine 0.44 - 1.00 mg/dL 329   5.18   Sodium 135 - 145 mmol/L 138  133  136   Potassium 3.5 - 5.1 mmol/L 4.2  3.9  3.8   Chloride 98 - 111 mmol/L 101  98  102   CO2 22 - 32 mmol/L 28  27  29    Calcium 8.9 - 10.3 mg/dL 8.7  8.4  8.5   Total Protein 6.5 - 8.1 g/dL   6.9   Total Bilirubin 0.3 - 1.2 mg/dL   0.2   Alkaline Phos 38 - 126 U/L   48   AST 15 - 41 U/L   32   ALT 0 - 44 U/L   14     Imaging: DG CHEST PORT  1 VIEW  Result Date: 02/25/2022 CLINICAL DATA:  Hypoxia EXAM: PORTABLE CHEST 1 VIEW COMPARISON:  CT examination dated February 22, 2022 FINDINGS: The heart size and mediastinal contours are within normal limits. Low lung volumes with bibasilar opacities suggesting atelectasis and/or pleural effusions, not significantly changed. The visualized skeletal structures are unremarkable. Feeding tube coursing below the diaphragm, distal tip not included. Right PICC with distal tip in the SVC, unchanged. IMPRESSION: 1.  Lines and tubes are unchanged. 2. Low lung volumes with bibasilar opacities suggesting atelectasis and/or effusions, unchanged. Electronically Signed   By: Larose Hires D.O.   On: 02/25/2022 08:50    Assessment/Plan:   Principal Problem:   Pelvic abscess  Active Problems:   Sepsis (HCC)   MRSA (methicillin resistant staph aureus) culture positive   Hepatic abscess   Hx of Fitz-Hugh-Curtis syndrome   Protein-calorie malnutrition (HCC)   Depression   Patient Summary: Paula Lucas is a 31 y.o. with past medical history of recent admission for tubo-ovarian abscess complicated by septic shock (02/04/22 - 02/07/22) with  previous history of tubo-ovarian abscess 2/2 N. Gonorrhea in 2019 who presented to the ED with c/o nausea, vomiting and abdominal pain, currently admitted for multiple pelvic abscesses s/p wash out with ex lap and JP drains placement on 02/16/22.  # Resolved sepsis 2/2 multiple pelvic abscesses, with ongoing abdominal pain S/P ex lap with washout and bowel adhesion lysis. Appears to be clinically improving, although pain control has been difficult. Transitioned to PO pain regimen though got one dose IV dilaudid yesterday post wound vac placement. Has been doing well with this. Pain reported improved today. Remains afebrile. WBC wnl.  -continue doxycycline for two weeks post op (end date 03/02/22)  -continue rocephin and flagyl for 3 weeks post op (end date 03/09/22) -pain regimen: Gabapentin 300 TID, Oxycontin 20mg  BID PO, and Dilaudid 2 mg q6 PO prn. Will likely convert to fentanyl patch w/ oxycodone IR for breakthrough tomorrow pending removal of Cortrak -PO/OT -encourage PO intake -Miralax, sennakot qhs, dulcolax suppository   #MRSA bacturia Urine cultures positive for MRSA. ID feels this was from transient bacteremia. TTE negative for endocarditis and thoracic MRI negative for osteo. Afebrile. WBC wnl. -ID following -transitioned to Zyvox. Course competed tomorrow. -PICC line in place   #Acute hypoxic respiratory failure 2/2 volume overload & atelectasis CTPA negative for PE but notable for moderate bilateral pleural effusions and atelectasis. No infectious symptoms and satting 90% on RA today. Responded well to Lasix 40 yesterday with 1L urine. No lower leg edema on exam. Continues to endorse some pain with inspiration. Remains afebrile, no leukocytosis though remains tachycardic. Thought to be 2/2 atelectasis vs pain/anxiety. -lasix 40 mg again today  -supplemental O2 prn -Continue to encourage PT, OOB, incentive spirometry   #Malnutrition Coretrak in place with ongoing tube feeds. Patient  eager for removal. -Advance diet as tolerated -Patient threatening to remove herself. Discussed with RD who agreed she requires improved nutritional status before removing but given patient's sentiment, will remove and encourage PO intake. She is eating minimally currently. Amenable to trying Boost.   Diet:  Reg + CoreTrack IVF: None, VTE:  Lovenox Code: Full  MD Internal Medicine Resident PGY-1 Please contact the on call pager after 5 pm and on weekends at [phone].

## 2022-02-25 NOTE — Progress Notes (Signed)
Initial Nutrition Assessment  DOCUMENTATION CODES:   Not applicable  INTERVENTION:   Discontinue tube feeding regimen Switch to complete Multivitamin w/ minerals daily Discontinue Ensure Enlive, replace with Boost Breeze po TID, each supplement provides 250 kcal and 9 grams of protein  NUTRITION DIAGNOSIS:   Inadequate oral intake related to nausea, poor appetite as evidenced by per patient/family report. - Progressing, being addressed via TF  GOAL:   Patient will meet greater than or equal to 90% of their needs - Being met via TF  MONITOR:   PO intake, Supplement acceptance, Labs, Weight trends, I & O's, Skin  REASON FOR ASSESSMENT:   Consult Assessment of nutrition requirement/status, Calorie Count  ASSESSMENT:   31 y.o. female presented to the ED with nausea, vomiting, and abdominal pain. No significant past medical history. Pt admitted with a pelvic abscess.   6/27 - ex-lap s/p abdominal washout with abscess 6/30 - Cortrak placed  7/04 - wound VAC placed 7/05- Cortrak removed  Pt laying in bed with towel over head; complaining of a migraine.  Pt requesting that Cortrak tube be removed, discussed the importance of proper nutrition to help with healing. RD expressed concern with removing tube due to pt minimal PO intake (I.e. skipping breakfast, minimal orders for meals); pt expressed that she does not eat breakfast at home. Pt agreeable to try and drink Boost Breeze instead of the Ensure's.  No meal intake recorded.  Discussed with Resident. Agreed that pt tube can be removed, but pt needs to focus on increasing PO intake.   Medications reviewed and include: Doxycycline, Linezolid, MVI, Protonix, Miralax, Senokot, IV antibiotics Labs reviewed.   Diet Order:   Diet Order             Diet regular Room service appropriate? Yes; Fluid consistency: Thin  Diet effective now                   EDUCATION NEEDS:   No education needs have been identified at this  time  Skin:  Skin Assessment: Reviewed RN Assessment  Last BM:  7/3  Height:   Ht Readings from Last 1 Encounters:  02/16/22 $RemoveB'5\' 3"'LHAkYraP$  (1.6 m)    Weight:   Wt Readings from Last 1 Encounters:  02/22/22 81.8 kg    Ideal Body Weight:  52.3 kg  BMI:  Body mass index is 31.95 kg/m.  Estimated Nutritional Needs:   Kcal:  2100-2300  Protein:  105-120 grams  Fluid:  >/= 2.1 L    Hermina Barters RD, LDN Clinical Dietitian See [facility] for contact information.

## 2022-02-26 LAB — CBC WITH DIFFERENTIAL/PLATELET
Abs Immature Granulocytes: 0.03 10*3/uL (ref 0.00–0.07)
Basophils Absolute: 0.1 10*3/uL (ref 0.0–0.1)
Basophils Relative: 1 %
Eosinophils Absolute: 0.2 10*3/uL (ref 0.0–0.5)
Eosinophils Relative: 2 %
HCT: 28.5 % — ABNORMAL LOW (ref 36.0–46.0)
Hemoglobin: 9 g/dL — ABNORMAL LOW (ref 12.0–15.0)
Immature Granulocytes: 0 %
Lymphocytes Relative: 31 %
Lymphs Abs: 3.3 10*3/uL (ref 0.7–4.0)
MCH: 28.9 pg (ref 26.0–34.0)
MCHC: 31.6 g/dL (ref 30.0–36.0)
MCV: 91.6 fL (ref 80.0–100.0)
Monocytes Absolute: 1.1 10*3/uL — ABNORMAL HIGH (ref 0.1–1.0)
Monocytes Relative: 10 %
Neutro Abs: 5.9 10*3/uL (ref 1.7–7.7)
Neutrophils Relative %: 56 %
Platelets: 470 10*3/uL — ABNORMAL HIGH (ref 150–400)
RBC: 3.11 MIL/uL — ABNORMAL LOW (ref 3.87–5.11)
RDW: 20.5 % — ABNORMAL HIGH (ref 11.5–15.5)
WBC: 10.5 10*3/uL (ref 4.0–10.5)
nRBC: 0 % (ref 0.0–0.2)

## 2022-02-26 LAB — GLUCOSE, CAPILLARY
Glucose-Capillary: 102 mg/dL — ABNORMAL HIGH (ref 70–99)
Glucose-Capillary: 103 mg/dL — ABNORMAL HIGH (ref 70–99)
Glucose-Capillary: 106 mg/dL — ABNORMAL HIGH (ref 70–99)

## 2022-02-26 MED ORDER — HYDROMORPHONE HCL 1 MG/ML IJ SOLN
0.5000 mg | Freq: Once | INTRAMUSCULAR | Status: AC
  Administered 2022-02-26: 0.5 mg via INTRAVENOUS
  Filled 2022-02-26: qty 0.5

## 2022-02-26 MED ORDER — DULOXETINE HCL 60 MG PO CPEP
60.0000 mg | ORAL_CAPSULE | Freq: Every day | ORAL | Status: DC
Start: 2022-02-26 — End: 2022-02-28
  Filled 2022-02-26 (×2): qty 1

## 2022-02-26 MED ORDER — OXYCODONE HCL 5 MG PO TABS
5.0000 mg | ORAL_TABLET | Freq: Three times a day (TID) | ORAL | Status: DC | PRN
  Administered 2022-02-26 – 2022-02-28 (×4): 5 mg via ORAL
  Filled 2022-02-26 (×7): qty 1

## 2022-02-26 MED ORDER — FENTANYL 25 MCG/HR TD PT72
1.0000 | MEDICATED_PATCH | TRANSDERMAL | Status: DC
  Administered 2022-02-26: 1 via TRANSDERMAL
  Filled 2022-02-26: qty 1

## 2022-02-26 NOTE — Consult Note (Signed)
WOC Nurse wound follow up Patient receiving care in [facility] [id number] Wound type: LQ midline abdominal incision Pressure Injury POA: NA Measurement: 8.2 cm x 4.2 cm x 3.1 cm Wound bed: pink/red,yellow fatty globules. Drainage (amount, consistency, odor) serosanguinous Periwound: intact Dressing procedure/placement/frequency: 1 piece of mepitel and black foam removed. 1 piece mepitel and 1 piece black foam placed in the wound. Half of barrier ring placed at the inferior side of the wound right at the pubic hair line. Drape applied, immediate suction obtained at 125 mmHg continuous.    WOC will follow M/W/F. Supplies ordered to be placed in patient room. 4 small black foam, 4 mepitel.  WOC will change vac dressing again on Monday.    Renaldo Reel Katrinka Blazing, MSN, RN, CMSRN, Angus Seller, [facility] Wound Treatment Associate Pager [phone]

## 2022-02-26 NOTE — Progress Notes (Signed)
Patient refused all oral medications this morning.

## 2022-02-26 NOTE — Progress Notes (Signed)
HD#17 Subjective:   Summary: Pt is a 31yo woman with prior tubo-ovarian abscess, admitted 6/15-6/18 with septic shock due to recurrent tubo-ovarian abscess then left AMA, who represented on 6/19 with sepsis secondary to multiple pelvic abscesses. She underwent ex lap with washout with OB & General Surgery, and has JP drains still in place.  Overnight Events: ~ 2300. Patient complaining of migraine and refusing PM medicines d/t headache.   Patient seen and evaluated at bedside. She reported migraine, requesting additional headache medications. Endorses pain bilaterally, photophobia, and associated nausea. No other reported symptoms. Patient given toradol for HA with improvement in sx. Continues to refuse abx.  Today on rounds, patient endorses continued improvement with her pain though she does request IV pain medication before wound vac dressing change today. She rates her pain 7/10, down from 10/10 previously. She has been able to get out of bed, even walking down the hall last night. Continues to endorse pain with deep inspiration. She does not like Boost but is willing to continue to try and drink as much as she can. She is also hoping to eat some strawberries brought from home today.  She feels very depressed and this is impacting her ability to eat food. She does not eat much when she is feeling down. She does not mean to, but feels very angry at night because she is staying here and not at home with her family. They come see her about ever other day. She goes to [facility] and gets an injection medicine once a month for bipolar disorder and schizophrenia. Last month was her last injection, she will call her mom to figure out when exactly. Abilify and seroquel are her meds via injection.  Objective:  Vital signs in last 24 hours: Vitals:   02/25/22 1639 02/26/22 0040 02/26/22 0100 02/26/22 0408  BP: 104/73 102/74  100/73  Pulse: (!) 122 (!) 127  (!) 113  Resp: 18 20  20   Temp: 99.4 F  (37.4 C) 98.1 F (36.7 C)  98 F (36.7 C)  TempSrc: Oral Oral  Oral  SpO2: 90% 91%  91%  Weight:   73.6 kg   Height:       Supplemental O2: Room Air    Physical Exam:  Constitutional: NAD. Lying in bed comfortably. HENT: normocephalic atraumatic, mucous membranes moist Eyes: conjunctiva non-erythematous Neck: supple Pulmonary/Chest: normal work of breathing on room air Neurological: alert & oriented x 3  Filed Weights   02/21/22 0500 02/22/22 0429 02/26/22 0100  Weight: 83.6 kg 81.8 kg 73.6 kg     Intake/Output Summary (Last 24 hours) at 02/26/2022 0800 Last data filed at 02/26/2022 0100 Gross per 24 hour  Intake 400 ml  Output 2820 ml  Net -2420 ml   Net IO Since Admission: 7,012.96 mL [02/26/22 0800]  Pertinent Labs:    Latest Ref Rng & Units 02/25/2022    6:00 AM 02/24/2022    5:23 AM 02/23/2022    5:47 AM  CBC  WBC 4.0 - 10.5 K/uL 11.2  10.1  9.2   Hemoglobin 12.0 - 15.0 g/dL 8.6  8.5  8.1   Hematocrit 36.0 - 46.0 % 27.8  26.8  25.7   Platelets 150 - 400 K/uL 464  456  459        Latest Ref Rng & Units 02/25/2022    6:00 AM 02/24/2022    5:23 AM 02/23/2022    5:47 AM  CMP  Glucose 70 - 99 mg/dL 04/26/2022  115  99   BUN 6 - 20 mg/dL 10  10  10    Creatinine 0.44 - 1.00 mg/dL  1.60  7.37   Sodium 135 - 145 mmol/L 138  133  136   Potassium 3.5 - 5.1 mmol/L 4.2  3.9  3.8   Chloride 98 - 111 mmol/L 101  98  102   CO2 22 - 32 mmol/L 28  27  29    Calcium 8.9 - 10.3 mg/dL 8.7  8.4  8.5   Total Protein 6.5 - 8.1 g/dL   6.9   Total Bilirubin 0.3 - 1.2 mg/dL   0.2   Alkaline Phos 38 - 126 U/L   48   AST 15 - 41 U/L   32   ALT 0 - 44 U/L   14     Imaging: DG CHEST PORT 1 VIEW  Result Date: 02/25/2022 CLINICAL DATA:  Hypoxia EXAM: PORTABLE CHEST 1 VIEW COMPARISON:  CT examination dated February 22, 2022 FINDINGS: The heart size and mediastinal contours are within normal limits. Low lung volumes with bibasilar opacities suggesting atelectasis and/or pleural effusions, not  significantly changed. The visualized skeletal structures are unremarkable. Feeding tube coursing below the diaphragm, distal tip not included. Right PICC with distal tip in the SVC, unchanged. IMPRESSION: 1.  Lines and tubes are unchanged. 2. Low lung volumes with bibasilar opacities suggesting atelectasis and/or effusions, unchanged. Electronically Signed   By: 04/28/2022 D.O.   On: 02/25/2022 08:50    Assessment/Plan:   Principal Problem:   Pelvic abscess  Active Problems:   Sepsis (HCC)   MRSA (methicillin resistant staph aureus) culture positive   Hepatic abscess   Hx of Fitz-Hugh-Curtis syndrome   Protein-calorie malnutrition (HCC)   Depression   Patient Summary: Paula Lucas is a 31 y.o. with past medical history of recent admission for tubo-ovarian abscess complicated by septic shock (02/04/22 - 02/07/22) with previous history of tubo-ovarian abscess 2/2 N. Gonorrhea in 2019 who presented to the ED with c/o nausea, vomiting and abdominal pain, currently admitted for multiple pelvic abscesses s/p wash out with ex lap and JP drains placement on 02/16/22.  # Resolved sepsis 2/2 multiple pelvic abscesses, with ongoing abdominal pain S/P ex lap with washout and bowel adhesion lysis. Appears to be clinically improving, although pain control has been difficult. Transitioned to PO pain regimen. Doing well with this. Remains afebrile.  -f/u wound vac management w general surgery -continue doxycycline for two weeks post op (end date 03/02/22)  -continue rocephin and flagyl for 3 weeks post op (end date 03/09/22) -transition to fentanyl patch 25 mcg/hr w/ oxycodone 5mg  q8 prn for breakthrough today pending removal of Cortrak -PO/OT -encourage PO intake -Miralax, sennakot qhs, dulcolax suppository   #MRSA bacturia Urine cultures positive for MRSA. ID feels this was from transient bacteremia. TTE negative for endocarditis and thoracic MRI negative for osteo. Afebrile. WBC wnl. -ID  following -Zyvox completed -PICC line in place   #Acute hypoxic respiratory failure 2/2 volume overload & atelectasis Satting 91% on RA today. Responded well to Lasix 40 yesterday with 2.8L urine. Remains tachycardic. Thought to be 2/2 atelectasis vs pain/anxiety. -will hold lasix pending ambulatory O2 sats -supplemental O2 prn -Continue to encourage PT, OOB, incentive spirometry   #Malnutrition Patient not meeting nutritional goals. RD had set goal of at least 3 boost prior to Cortrak removal but patient was unable as she does not like the taste. Patient aware of our concerns for her  nutrition.  -Coretrak removed given patient threats to do so herself -Advance diet as tolerated -encourage PO intake  #Bipolar Disorder, Type 1 #Depression Patient is followed by [facility] where she receives injection medication for her bipolar and schizophrenia. She is also on Cymbalta at home. Patient is depressed citing causes of hospital setting, begin away from family. She feels this inhibits her eating. -Restart home Cymbalta   Diet:  Reg + CoreTrack IVF: None, VTE:  Lovenox Code: Full  Adron Bene MD Internal Medicine Resident PGY-1 Please contact the on call pager after 5 pm and on weekends at [phone].

## 2022-02-26 NOTE — Progress Notes (Signed)
Physical Therapy Treatment Patient Details Name: Paula Lucas MRN: [id number] DOB: [birth year] Today's Date: 02/26/2022   History of Present Illness Pt is a 31 y/o female admitted 6/19 secondary to worsening abdominal pain. Pt left AMA on 6/18 from ICU for pelvic abscess and had drain placement during that admission. Pt reports she was [redacted] weeks pregnant at that time. Found to have tuboovarian abscess and pelvic abscess. Pt is s/p second drain placement and exchange and repositioning of first drain. Cortrak placed 6/30. Abdominal wound vac placed 7/4. Cortrak removed 7/6. PMH includes STI, tubo-ovarian abscess, PID.    PT Comments    Pt received seated EOB, agreeable to therapy session with goal of mobilizing in hallway initially, with fair participation and limited due to severe abdominal/"lung" pain and nausea with standing. Pt orthostatics checked and BP stable, but per chart review pt with poor PO intake since cortrak removed, encouraged her to increase intake for strength/muscle retention so she can meet her goals. Pt c/o depression with prolonged hospital stay but allowing therapist to minimally raise her blinds for first time since she came to unit. Pt continues to require minA for transfers, refusing ambulation today. Pt continues to benefit from PT services to progress toward functional mobility goals.  Goal update due next session.  Recommendations for follow up therapy are one component of a multi-disciplinary discharge planning process, led by the attending physician.  Recommendations may be updated based on patient status, additional functional criteria and insurance authorization.  Follow Up Recommendations  Home health PT (pending progress)     Assistance Recommended at Discharge Intermittent Supervision/Assistance  Patient can return home with the following Help with stairs or ramp for entrance;Assist for transportation;Assistance with cooking/housework;A little help with  bathing/dressing/bathroom;A little help with walking and/or transfers   Equipment Recommendations  Rolling walker (2 wheels);BSC/3in1    Recommendations for Other Services       Precautions / Restrictions Precautions Precautions: Fall Precaution Comments: Contact;  abd JP x2 drains, wound vac; watch HR/O2 Restrictions Weight Bearing Restrictions: No     Mobility  Bed Mobility Overal bed mobility: Needs Assistance Bed Mobility: Rolling, Sidelying to Sit, Sit to Sidelying Rolling: Min assist Sidelying to sit: Min assist     Sit to sidelying: Min assist General bed mobility comments: cues for technique, use of bed rail, pad assist to reach full sidelying and advance hips; minA to lift BLE over EOB. Pt reports less pain when performing with log roll technique (though still painful)    Transfers Overall transfer level: Needs assistance Equipment used: Rolling walker (2 wheels) Transfers: Sit to/from Stand Sit to Stand: Min assist           General transfer comment: minA for sit<>stand from EOB<>RW, pt c/o increased pain/nausea standing at EOB, orthostatics stable; sidesteps along EOB x2 but refusing ambulation.    Ambulation/Gait               General Gait Details: pt refusing today, c/o nausea       Balance Overall balance assessment: Needs assistance Sitting-balance support: No upper extremity supported, Feet supported Sitting balance-Leahy Scale: Fair Sitting balance - Comments: Supervision for static sitting   Standing balance support: Bilateral upper extremity supported Standing balance-Leahy Scale: Fair Standing balance comment: fair dynamic balance with BUE support for limited standing tasks at bedside and single UE support with static standing  Cognition Arousal/Alertness: Awake/alert Behavior During Therapy: Flat affect, Impulsive Overall Cognitive Status: Within Functional Limits for tasks assessed                                  General Comments: pt c/o depression, received seated EOB and initially agreeable to hallway ambulation, after PTA took a few minutes to find pin for her drains and O2 tank, pt reporting increased nausea/fatigue and needing to lay back down. Pt responding with max encouragement to participate slightly more at EOB but refusing ambulation in room/hall. Encouraged her to increase food/water intake as sx nausea likely related to poor intake since cortrak removed.        Exercises General Exercises - Lower Extremity Ankle Circles/Pumps: AROM, Both, 10 reps, Supine Heel Slides: AROM, Both, 10 reps, Supine Straight Leg Raises: AROM, Both, Supine (3 reps ea) Other Exercises Other Exercises: STS x 1 rep for BLE strengthening    General Comments General comments (skin integrity, edema, etc.): BP 110/78 supine, BP 102/72 standing (c/o nausea/fatigue), HR 120-130 bpm with transfers/sitting; BP 109/76 after return to supine      Pertinent Vitals/Pain Pain Assessment Pain Assessment: Faces Faces Pain Scale: Hurts whole lot Pain Location: Lungs, R ribcage and sx site Pain Descriptors / Indicators: Grimacing, Moaning, Constant, Guarding Pain Intervention(s): Monitored during session, Repositioned, Limited activity within patient's tolerance, Patient requesting pain meds-RN notified           PT Goals (current goals can now be found in the care plan section) Acute Rehab PT Goals Patient Stated Goal: short term: to decrease pain, to ambulate in hallway PT Goal Formulation: With patient Time For Goal Achievement: 02/27/22 Progress towards PT goals: Progressing toward goals    Frequency    Min 3X/week      PT Plan Current plan remains appropriate       AM-PAC PT "6 Clicks" Mobility   Outcome Measure  Help needed turning from your back to your side while in a flat bed without using bedrails?: A Little Help needed moving from lying on your back to  sitting on the side of a flat bed without using bedrails?: A Little Help needed moving to and from a bed to a chair (including a wheelchair)?: A Little Help needed standing up from a chair using your arms (e.g., wheelchair or bedside chair)?: A Little Help needed to walk in hospital room?: A Lot (refusing today but did perform <12 hours prior with nsg staff) Help needed climbing 3-5 steps with a railing? : Total 6 Click Score: 15    End of Session   Activity Tolerance: Patient limited by fatigue;Patient limited by pain;Other (comment) (pt c/o nausea and depression, MD/RN notified) Patient left: in bed;with call bell/phone within reach;with bed alarm set (heels floated) Nurse Communication: Mobility status;Other (comment);Patient requests pain meds (pt c/o nausea, refusing to drink her boost supplement and c/o bad flavor) PT Visit Diagnosis: Other abnormalities of gait and mobility (R26.89);Pain;Muscle weakness (generalized) (M62.81);Difficulty in walking, not elsewhere classified (R26.2) Pain - Right/Left: Right Pain - part of body:  (ribcage/"lung" and surgical site at abd)     Time: 1030-1057 PT Time Calculation (min) (ACUTE ONLY): 27 min  Charges:  $Therapeutic Exercise: 8-22 mins $Therapeutic Activity: 8-22 mins                     Desani Sprung P., PTA Acute Rehabilitation Services Secure Chat Preferred 9a-5:30pm  Office: [phone]    Dorathy Kinsman Jashan Cotten 02/26/2022, 11:18 AM

## 2022-02-26 NOTE — TOC Progression Note (Signed)
Transition of Care [facility]) - Progression Note    Patient Details  Name: JOANNY DUPREE MRN: [id number] Date of Birth: 09-Oct-1990  Transition of [facility]) CM/SW Contact  Mearl Latin, LCSW Phone Number: 02/26/2022, 3:46 PM  Clinical Narrative:    CSW and MSW intern spoke with patient, who wanted to know reason for visit. CSW explained that we wanted to check on patient. She stated she wants to go home. CSW asked her where home was and she responded with her mother. CSW asked if she had seen her mother lately and she stated she will be visiting today. She stated they have talked and she will be going there at discharge. CSW offered her community resources just in case which she accepted. She again asked CSW when she was going to be able to go home. CSW deferred to medical team.    Expected Discharge Plan: Home/Self Care Barriers to Discharge: Continued Medical Work up  Expected Discharge Plan and Services Expected Discharge Plan: Home/Self Care   Discharge Planning Services: CM Consult   Living arrangements for the past 2 months: Hotel/Motel                 DME Arranged: N/A         HH Arranged: RN HH Agency: [facility] Date [facility], [facility], [facility] Agency Contacted: 02/10/22 Time HH Agency Contacted: 1155 Representative spoke with at [facility] Agency: Kandee Keen   Social Determinants of Health (SDOH) Interventions    Readmission Risk Interventions     No data to display

## 2022-02-26 NOTE — Progress Notes (Addendum)
Paged by RN ~ 2300. Patient complaining of migraine and refusing medicines d/t headache.   Patient seen and evaluated at bedside. She reported migraine, requesting additional headache medications. Endorses pain bilaterally, photophobia, and associated nausea. No other reported symptoms.   Resting comfortably in bed.  Alert and oriented.  Behaving appropriately.  CN grossly intact.   Patient given toradol for HA with improvement in sx. Continues to refuse abx.

## 2022-02-26 NOTE — Progress Notes (Signed)
Occupational Therapy Treatment Patient Details Name: Paula Lucas MRN: [id number] DOB: 1991-02-19 Today's Date: 02/26/2022   History of present illness Pt is a 31 y/o female admitted 6/19 secondary to worsening abdominal pain. Pt left AMA on 6/18 from ICU for pelvic abscess and had drain placement during that admission. Pt reports she was [redacted] weeks pregnant at that time. Found to have tuboovarian abscess and pelvic abscess. Pt is s/p second drain placement and exchange and repositioning of first drain. Cortrak placed 6/30. Abdominal wound vac placed 7/4. Cortrak removed 7/6. PMH includes STI, tubo-ovarian abscess, PID.   OT comments  Min assist to EOB using log roll technique and to transfer bed<>BSC for toileting. Participated in grooming seated EOB. Unable to tolerate sitting up in chair due to abdominal pain. Updated discharge to inpatient rehab.    Recommendations for follow up therapy are one component of a multi-disciplinary discharge planning process, led by the attending physician.  Recommendations may be updated based on patient status, additional functional criteria and insurance authorization.    Follow Up Recommendations  Acute inpatient rehab (3hours/day)    Assistance Recommended at Discharge Frequent or constant Supervision/Assistance  Patient can return home with the following  A little help with walking and/or transfers;A lot of help with bathing/dressing/bathroom;Direct supervision/assist for medications management;Direct supervision/assist for financial management;Assist for transportation;Help with stairs or ramp for entrance;Assistance with cooking/housework   Equipment Recommendations  BSC/3in1;Tub/shower seat    Recommendations for Other Services      Precautions / Restrictions Precautions Precautions: Fall Precaution Comments: Contact;  abd JP x2 drains, wound vac; watch HR/O2 Restrictions Weight Bearing Restrictions: No       Mobility Bed Mobility Overal  bed mobility: Needs Assistance Bed Mobility: Rolling, Sidelying to Sit, Sit to Sidelying Rolling: Min assist Sidelying to sit: Min assist     Sit to sidelying: Min assist General bed mobility comments: cues for log roll technique, assist to raise trunk and for LEs back into bed, increased time    Transfers Overall transfer level: Needs assistance Equipment used: 1 person hand held assist Transfers: Sit to/from Stand, Bed to chair/wheelchair/BSC Sit to Stand: Min assist Stand pivot transfers: Min assist         General transfer comment: assist to rise and steady     Balance Overall balance assessment: Needs assistance   Sitting balance-Leahy Scale: Fair     Standing balance support: Single extremity supported Standing balance-Leahy Scale: Poor Standing balance comment: reliant on at least one hand support                           ADL either performed or assessed with clinical judgement   ADL Overall ADL's : Needs assistance/impaired     Grooming: Wash/dry hands;Sitting;Supervision/safety                   Toilet Transfer: Minimal assistance;Stand-pivot;BSC/3in1   Toileting- Clothing Manipulation and Hygiene: Maximal assistance;Sit to/from stand Toileting - Clothing Manipulation Details (indicate cue type and reason): posterior pericare            Extremity/Trunk Assessment              Vision       Perception     Praxis      Cognition Arousal/Alertness: Awake/alert Behavior During Therapy: Flat affect Overall Cognitive Status: Impaired/Different from baseline Area of Impairment: Safety/judgement  Safety/Judgement: Decreased awareness of safety     General Comments: decreased awareness of lines, impulsive        Exercises      Shoulder Instructions       General Comments HR in 120s throughout    Pertinent Vitals/ Pain       Pain Assessment Pain Assessment: Faces Faces Pain  Scale: Hurts whole lot Pain Location: abdomen Pain Descriptors / Indicators: Grimacing, Moaning, Constant, Guarding Pain Intervention(s): Premedicated before session, Repositioned  Home Living                                          Prior Functioning/Environment              Frequency  Min 2X/week        Progress Toward Goals  OT Goals(current goals can now be found in the care plan section)  Progress towards OT goals: Progressing toward goals  Acute Rehab OT Goals OT Goal Formulation: With patient Time For Goal Achievement: 03/13/22 Potential to Achieve Goals: Fair ADL Goals Pt Will Perform Grooming: with min assist;standing Pt Will Perform Lower Body Bathing: with min assist;sit to/from stand Pt Will Perform Lower Body Dressing: with min assist;sit to/from stand Pt Will Transfer to Toilet: ambulating;bedside commode;with min guard assist Pt Will Perform Toileting - Clothing Manipulation and hygiene: with min assist;sit to/from stand  Plan Discharge plan needs to be updated    Co-evaluation                 AM-PAC OT "6 Clicks" Daily Activity     Outcome Measure   Help from another person eating meals?: None Help from another person taking care of personal grooming?: A Little Help from another person toileting, which includes using toliet, bedpan, or urinal?: Total Help from another person bathing (including washing, rinsing, drying)?: A Lot Help from another person to put on and taking off regular upper body clothing?: A Little Help from another person to put on and taking off regular lower body clothing?: A Lot 6 Click Score: 15    End of Session    OT Visit Diagnosis: Unsteadiness on feet (R26.81);Other abnormalities of gait and mobility (R26.89);Muscle weakness (generalized) (M62.81)   Activity Tolerance Patient limited by pain   Patient Left in bed;with call bell/phone within reach;with bed alarm set   Nurse Communication           Time: 1250-1309 OT Time Calculation (min): 19 min  Charges: OT General Charges $OT Visit: 1 Visit OT Treatments $Self Care/Home Management : 8-22 mins  Berna Spare, OTR/L Acute Rehabilitation Services Office: [phone]   Evern Bio 02/26/2022, 2:22 PM

## 2022-02-26 NOTE — Progress Notes (Signed)
Received patient from previous night shift RN. Patient in bed upset and states that she wants to be transferred to a different unit. Charge RN also at bedside. Patient agrees to allow this RN to take over care.  Patient given IV toradol and zofran for c/o headache. She also states that she is getting depressed being in the room all the time. This RN suggested that patient ambulate in the hall and she agreed. Patient ambulated in the hall using walker without any complaints.  Patient calm and cooperative. She still refused PM meds stating, "this is the first time I've felt okay in a while. Those meds don't make me feel good so I don't want to take them tonight." MD made aware that patient will not take PM meds.   Currently, patient is back in bed with no complaints. This RN will continue to monitor.

## 2022-02-26 NOTE — Progress Notes (Signed)
Inpatient Rehab Admissions Coordinator:   Pt was screened for CIR by Estill Dooms, PT, DPT.  I discussed with therapy who feel pt may be appropriate, and I will place an order for CIR consult, per our protocol, to fully assess candidacy.  Estill Dooms, PT, DPT Admissions Coordinator [phone] 02/26/22  12:50 PM

## 2022-02-26 NOTE — Progress Notes (Signed)
Patient ID: Paula Lucas, female   DOB: 1991-05-23, 31 y.o.   MRN: [id number]  Assessment/Plan: Principal Problem:   Pelvic abscess  Active Problems:   Sepsis ([place])   MRSA (methicillin resistant staph aureus) culture positive   Hepatic abscess   Hx of Fitz-Hugh-Curtis syndrome   Protein-calorie malnutrition (HCC)   Depression  Pelvic abscess/Hepatic abscess Patient has multiple abscesses in her abdomen and pelvis related to prior PID with current GC and Chlamydia negative. Patient now s/p ex lap with pelvic and abdominal wash out on 02/16/22 Wound vac placed 02/23/22 by [facility] [place] Nurse, appreciate their help and ongoing care for this patient.  Please consult General Surgery about follow up plans about wound vac as we usually do not place these for our surgeries and I am not aware of the necessary follow up needed after discharge. Cultures from surgery are sterile. Her WBC is in the normal range.  She remains afebrile.  Reports mild improvement in her abdominal pain. Positive flatus and BM. Patient wants to go home. Continue Linezolid, Rocephin, Doxycycline, Flagyl--appreciate ID input. Aware that patient refused antibiotic overnight, I discussed the importance of taking these medications with patient.   Please discuss appropriate home regimen with ID if patient is being discharged. Continue pain medications as needed If further surgery is planned, would need GYN ONC consult.  Cardiopulmonary issues 02/22/2022 CT angio chest negative for PR, but with bilateral pleural effusions, and near complete atelectasis if left lower lobe. 02/26/2022 X-ray shows low lung volumes with bibasilar opacities suggesting atelectasis and/or pleural effusions, not significantly changed from previous study on 7/2/23023.   Continued chest pain is likely due to work with breathing and aforementioned findings. On Lasix as per primary team, will continue this as prescribed  Continue oxygen as needed, recommend incentive  spirometry, ambulation, PT Will defer decisions about further imaging to primary team  Depression Recommend starting antidepressant, discussed with team.  TOC consult placed  Severe protein calorie malnutrition She is now status post core track placement with tube feeds ongoing.  Continue advancing her diet. Patient does not like the tube and keeps threatening to remove it.   Sepsis [facility])  She is not currently actively septic Thoracic MRI and TTE negative for infection.   MRSA (methicillin resistant staph aureus) culture positive On Linezolid   Iron deficiency anemia No active bleeding, s/p Venofer  Disposition Pending plans for her wound vac, antibiotics etc. Cautioned about risk of leaving AMA and getting worse again, getting septic again  or worse and needing re-admission or other intervention.   Appreciate care of Paula Lucas by her primary team.  Will continue to follow along. Please call [phone] [facility] OB/GYN Consult Attending Monday-Friday 8am - 5pm) or [phone] [facility] OB/GYN Attending On Call all day, every day) for any gynecologic concerns at any time.  Thank you for involving [place] in the care of this patient.  Paula Schneiders, MD, [place] [name]    Subjective: Interval History: Still complains of chest pain and some abdominal pain. She is very angry, wants to go home immediately.  "I am tired of being here". Does not want any further intervention, just wants to go home.  Objective: Vital signs in last 24 hours: Temp:  [98 F (36.7 C)-99.4 F (37.4 C)] 98 F (36.7 C) (07/07 0817) Pulse Rate:  [106-127] 106 (07/07 0817) Resp:  [15-20] 15 (07/07 0817) BP: (100-115)/(48-74) 100/48 (07/07 0817) SpO2:  [  90 %-91 %] 91 % (07/07 0817) Weight:  [73.6 kg] 73.6 kg (07/07 0100)  Intake/Output from previous day: 07/06 0701 - 07/07 0700 In: 400 [P.O.:400] Out: 2820  [Urine:2800; Drains:20] Intake/Output this shift: No intake/output data recorded.  Physical exam General appearance: alert, cooperative, and appears stated age Head: Normocephalic, without obvious abnormality, atraumatic Lungs: Normal effort Heart: regular rate, regular rhythm Abdomen: less distended, diffusely tender to palpation, JP drains in place Incision: Wound vac in place, intact and attached to suction Extremities: Homans sign is negative, no sign of DVT Skin: Skin color, texture, turgor normal. No rashes or lesions Neurologic: Grossly normal  Results for orders placed or performed during the hospital encounter of 02/08/22 (from the past 24 hour(s))  Glucose, capillary     Status: Abnormal   Collection Time: 02/25/22  1:41 PM  Result Value Ref Range   Glucose-Capillary 131 (H) 70 - 99 mg/dL  Glucose, capillary     Status: Abnormal   Collection Time: 02/25/22  4:41 PM  Result Value Ref Range   Glucose-Capillary 143 (H) 70 - 99 mg/dL  Glucose, capillary     Status: Abnormal   Collection Time: 02/26/22 12:39 AM  Result Value Ref Range   Glucose-Capillary 106 (H) 70 - 99 mg/dL  Glucose, capillary     Status: Abnormal   Collection Time: 02/26/22  4:06 AM  Result Value Ref Range   Glucose-Capillary 103 (H) 70 - 99 mg/dL  Glucose, capillary     Status: Abnormal   Collection Time: 02/26/22  8:20 AM  Result Value Ref Range   Glucose-Capillary 102 (H) 70 - 99 mg/dL  CBC with Differential/Platelet     Status: Abnormal   Collection Time: 02/26/22  8:50 AM  Result Value Ref Range   WBC 10.5 4.0 - 10.5 K/uL   RBC 3.11 (L) 3.87 - 5.11 MIL/uL   Hemoglobin 9.0 (L) 12.0 - 15.0 g/dL   HCT 28.5 (L) 36.0 - 46.0 %   MCV 91.6 80.0 - 100.0 fL   MCH 28.9 26.0 - 34.0 pg   MCHC 31.6 30.0 - 36.0 g/dL   RDW 20.5 (H) 11.5 - 15.5 %   Platelets 470 (H) 150 - 400 K/uL   nRBC 0.0 0.0 - 0.2 %   Neutrophils Relative % 56 %   Neutro Abs 5.9 1.7 - 7.7 K/uL   Lymphocytes Relative 31 %   Lymphs  Abs 3.3 0.7 - 4.0 K/uL   Monocytes Relative 10 %   Monocytes Absolute 1.1 (H) 0.1 - 1.0 K/uL   Eosinophils Relative 2 %   Eosinophils Absolute 0.2 0.0 - 0.5 K/uL   Basophils Relative 1 %   Basophils Absolute 0.1 0.0 - 0.1 K/uL   Immature Granulocytes 0 %   Abs Immature Granulocytes 0.03 0.00 - 0.07 K/uL    Studies/Results: DG CHEST PORT 1 VIEW  Result Date: 02/25/2022 CLINICAL DATA:  Hypoxia EXAM: PORTABLE CHEST 1 VIEW COMPARISON:  CT examination dated February 22, 2022 FINDINGS: The heart size and mediastinal contours are within normal limits. Low lung volumes with bibasilar opacities suggesting atelectasis and/or pleural effusions, not significantly changed. The visualized skeletal structures are unremarkable. Feeding tube coursing below the diaphragm, distal tip not included. Right PICC with distal tip in the SVC, unchanged. IMPRESSION: 1.  Lines and tubes are unchanged. 2. Low lung volumes with bibasilar opacities suggesting atelectasis and/or effusions, unchanged. Electronically Signed   By: Keane Police D.O.   On: 02/25/2022 08:50   CT Angio  Chest Pulmonary Embolism (PE) W or WO Contrast  Result Date: 02/22/2022 CLINICAL DATA:  Pulmonary embolism (PE) suspected, high prob Acute hypoxic respiratory failure, postsurgical. EXAM: CT ANGIOGRAPHY CHEST WITH CONTRAST TECHNIQUE: Multidetector CT imaging of the chest was performed using the standard protocol during bolus administration of intravenous contrast. Multiplanar CT image reconstructions and MIPs were obtained to evaluate the vascular anatomy. RADIATION DOSE REDUCTION: This exam was performed according to the departmental dose-optimization program which includes automated exposure control, adjustment of the mA and/or kV according to patient size and/or use of iterative reconstruction technique. CONTRAST:  58mL OMNIPAQUE IOHEXOL 350 MG/ML SOLN COMPARISON:  Chest radiograph yesterday. Thoracic spine MRI yesterday. Lung bases from recent abdominal CT  reviewed. FINDINGS: Cardiovascular: There are no filling defects within the pulmonary arteries to suggest pulmonary embolus. No aortic dissection or acute aortic findings. Common origin of brachiocephalic and left common carotid artery, variant arch anatomy. The heart is normal in size. Trace pericardial fluid anteriorly. Mediastinum/Nodes: Feeding tube decompresses the esophagus. No mediastinal adenopathy. No bulky hilar adenopathy. No suspicious axillary adenopathy. Lungs/Pleura: Moderate bilateral pleural effusions. There is associated compressive atelectasis. This includes near complete atelectasis of the left lower lobe. No features of pulmonary edema. The trachea and central bronchi are patent. Upper Abdomen: Small amount of perihepatic fluid adjacent to the anterior right hepatic lobe, also seen on prior abdominal CT, series 6, image 89, not significantly changed. Musculoskeletal: There are no acute or suspicious osseous abnormalities. Mild body wall edema. Review of the MIP images confirms the above findings. IMPRESSION: 1. No pulmonary embolus. 2. Moderate bilateral pleural effusions with associated compressive atelectasis. This includes near complete atelectasis of the left lower lobe. 3. Small amount of perihepatic fluid, also seen on prior abdominal CT. This is not well assessed on the current exam due to phase of contrast. Electronically Signed   By: Keith Rake M.D.   On: 02/22/2022 00:50   DG CHEST PORT 1 VIEW  Result Date: 02/21/2022 CLINICAL DATA:  Dyspnea EXAM: PORTABLE CHEST 1 VIEW COMPARISON:  02/20/2022 FINDINGS: Feeding catheter is noted extending into the stomach. Right-sided PICC is noted in satisfactory position. Cardiac shadow is stable. Persistent left retrocardiac density is noted. Mild right basilar atelectasis is noted as well. Small effusions are seen. Stable vascular congestion is noted. IMPRESSION: Overall appearance of the chest is stable from the prior exam. Electronically  Signed   By: Inez Catalina M.D.   On: 02/21/2022 23:12   MR THORACIC SPINE W WO CONTRAST  Result Date: 02/21/2022 CLINICAL DATA:  31 year old female. History of MRSA, PID. Pelvic and abdominal abscesses. EXAM: MRI THORACIC WITHOUT AND WITH CONTRAST TECHNIQUE: Multiplanar and multiecho pulse sequences of the thoracic spine were obtained without and with intravenous contrast. CONTRAST:  7.57mL GADAVIST GADOBUTROL 1 MMOL/ML IV SOLN COMPARISON:  Lumbar MRI 02/12/2022. CT Abdomen and Pelvis 02/14/2022. FINDINGS: Limited cervical spine imaging: Negative aside from mild straightening and reversal of cervical lordosis. Thoracic spine segmentation: Appears to be normal and concordant with the lumbar numbering last month. Alignment: Maintained thoracic vertebral height and alignment. Relatively normal kyphosis. Vertebrae: No marrow edema or evidence of acute osseous abnormality. Somewhat generalized decreased T1 marrow signal in the visible spine and ribs may reflect red marrow reactivation. Cord: No abnormal intradural enhancement or dural thickening. No intraspinal fluid collection is evident. Thoracic spinal cord signal and morphology are within normal limits. Paraspinal and other soft tissues: Bilateral pleural effusions and compressed lung parenchyma similar to the recent CT Abdomen and  Pelvis. Negative visible kidneys. Thoracic paraspinal soft tissues remain within normal limits. Thoracic epidural space is within normal limits. Disc levels: Maintained normal thoracic intervertebral disc signal and morphology throughout. There is mild bilateral facet hypertrophy at T9-T10. No thoracic spinal stenosis. No significant thoracic neural foraminal stenosis. IMPRESSION: 1. No evidence of thoracic spinal infection. Normal thoracic discs. No thoracic spinal stenosis or significant neural impingement. 2. Bilateral pleural effusions and compressed lung parenchyma similar to the recent CT Abdomen and Pelvis. Electronically Signed    By: Genevie Ann M.D.   On: 02/21/2022 10:15   DG CHEST PORT 1 VIEW  Result Date: 02/20/2022 CLINICAL DATA:  Shortness of breath EXAM: PORTABLE CHEST - 1 VIEW COMPARISON:  02/18/2022 FINDINGS: Unchanged mild cardiomegaly and mild pulmonary vascular congestion. Bibasilar opacities likely combination of small pleural effusions with adjacent atelectasis. Feeding tube terminates in the left upper quadrant. Right upper extremity PICC terminates at the cavoatrial junction. IMPRESSION: 1. Mild cardiomegaly. 2. Mild pulmonary vascular congestion. 3. Small bilateral pleural effusions with adjacent atelectasis. Electronically Signed   By: Miachel Roux M.D.   On: 02/20/2022 09:30   DG Abd Portable 1V  Result Date: 02/19/2022 CLINICAL DATA:  Provided history: Encounter for feeding tube placement. EXAM: PORTABLE ABDOMEN - 1 VIEW COMPARISON:  Abdominal radiograph 02/18/2022. FINDINGS: An enteric tube passes below the level left hemidiaphragm with tip projecting in the expected location of the pylorus or first portion of the duodenum. Nonobstructive bowel gas pattern within the relies abdomen. No acute bony abnormality identified. IMPRESSION: Enteric tube present with tip projecting in the expected location of the pylorus or first portion of the duodenum. Nonspecific, nonobstructive bowel gas pattern within the visualized abdomen. Electronically Signed   By: Kellie Simmering D.O.   On: 02/19/2022 14:20   DG CHEST PORT 1 VIEW  Result Date: 02/18/2022 CLINICAL DATA:  Central line placement EXAM: PORTABLE CHEST 1 VIEW COMPARISON:  Radiograph 09/06/2021 FINDINGS: Right upper extremity PICC tip crosses the midline, across the brachiocephalic confluence, tip overlies the left brachiocephalic vein. Borderline enlarged cardiac silhouette. Bibasilar consolidations, left greater than right. Small bilateral pleural effusions. Low lung volumes. No pneumothorax. No acute osseous abnormality. IMPRESSION: Right upper extremity PICC tip courses  across the brachiocephalic confluence, tip overlying the left brachiocephalic vein. Recommend repositioning. Bilateral pleural effusions with adjacent bibasilar atelectasis, left greater than right. Low lung volumes. These results will be called to the ordering clinician or representative by the Radiologist Assistant, and communication documented in the PACS or [employer]. Electronically Signed   By: Maurine Simmering M.D.   On: 02/18/2022 18:18   [place] EKG SITE RITE  Result Date: 02/18/2022 If Site Rite image not attached, placement could not be confirmed due to current cardiac rhythm.  DG Abd Portable 1V  Result Date: 02/18/2022 CLINICAL DATA:  Abdominal distension. EXAM: PORTABLE ABDOMEN - 1 VIEW COMPARISON:  Abdominal CT February 14, 2022 FINDINGS: Drainage catheters again seen in the lower abdomen/pelvis. No significant change in the appearance of the bowel pattern. No evidence of small-bowel obstruction. IMPRESSION: Stable appearance of the bowel pattern. Electronically Signed   By: Fidela Salisbury M.D.   On: 02/18/2022 12:52   CT ABDOMEN PELVIS W CONTRAST  Result Date: 02/14/2022 CLINICAL DATA:  Follow-up intra-abdominal abscesses. EXAM: CT ABDOMEN AND PELVIS WITH CONTRAST TECHNIQUE: Multidetector CT imaging of the abdomen and pelvis was performed using the standard protocol following bolus administration of intravenous contrast. RADIATION DOSE REDUCTION: This exam was performed according to the departmental dose-optimization  program which includes automated exposure control, adjustment of the mA and/or kV according to patient size and/or use of iterative reconstruction technique. CONTRAST:  OMNIPAQUE IOHEXOL 300 MG/ML  SOLN COMPARISON:  CT scan 02/08/2022 FINDINGS: Lower chest: Persistent moderate-sized pleural effusions with overlying atelectasis. Hepatobiliary: Small residual lentiform rim enhancing abscess at the dome of the liver anteriorly measuring a maximum of 5.6 x 1.1 cm. This  previously measured approximately 9.3 x 1.1 cm. Inferiorly there is a persistent perihepatic abscess. It measures approximately 9.5 x 3.1 cm and previously measured 10.1 x 3.7 cm. No worrisome hepatic lesions or intrahepatic biliary dilatation. The gallbladder is unremarkable. No common bile duct dilatation. Pancreas: No mass, inflammation or ductal dilatation. Spleen: Normal size.  No focal lesions. Adrenals/Urinary Tract: Adrenal glands and kidneys are unremarkable and stable. The bladder is grossly normal. Stomach/Bowel: The stomach, duodenum, small bowel and colon grossly normal. Vascular/Lymphatic: The aorta and branch vessels are patent. The major venous structures are patent. Stable borderline enlarged mesenteric and retroperitoneal lymph nodes, likely reactive/inflammatory. Reproductive: The uterus and ovaries are stable. Other: Percutaneous drainage catheter in the upper right pelvis with a persistent rim enhancing abscess measuring approximately 8.8 x 3.5 cm and previously measuring 9.8 x 5.6 cm. A second drainage catheter in the lower right cul-de-sac area with a small persistent rim enhancing abscess measuring 4.1 x 2.0 cm. This previously measured 5.2 x 4.9 cm. Left-sided rounded pelvic abscess measures 3.6 x 2.9 cm on image 71/3. This previously measured 3.5 x 2.7 cm. Persistent diffuse mesenteric and subcutaneous inflammations/edema. Musculoskeletal: No significant bony findings. IMPRESSION: 1. Persistent moderate-sized pleural effusions with overlying atelectasis. 2. Persistent but slightly smaller undrained perihepatic abscesses. 3. Two right-sided pelvic drainage catheters with persistent but smaller surrounding abscesses. 4. Left-sided undrained pelvic abscess is slightly smaller also. 5. Persistent diffuse mesenteric and subcutaneous inflammation. 6. Stable borderline enlarged mesenteric and retroperitoneal lymph nodes, likely reactive/inflammatory. Electronically Signed   By: Rudie Meyer M.D.    On: 02/14/2022 14:46   MR Lumbar Spine W Wo Contrast  Result Date: 02/13/2022 CLINICAL DATA:  Initial evaluation for back pain, history of MRSA bacteremia. EXAM: MRI LUMBAR SPINE WITHOUT AND WITH CONTRAST TECHNIQUE: Multiplanar and multiecho pulse sequences of the lumbar spine were obtained without and with intravenous contrast. CONTRAST:  7.4mL GADAVIST GADOBUTROL 1 MMOL/ML IV SOLN COMPARISON:  Prior CT from 02/08/2022. FINDINGS: Segmentation: Standard. Lowest well-formed disc space labeled the L5-S1 level. Alignment: Mild levoscoliosis with straightening of the normal lumbar lordosis. No listhesis. Vertebrae: Vertebral body height maintained without acute or chronic fracture. Bone marrow signal intensity diffusely decreased on T1 weighted sequence, nonspecific, but most commonly related to anemia, smoking or obesity. No discrete or worrisome osseous lesions. No evidence for osteomyelitis discitis or septic arthritis within the lumbar spine. Conus medullaris and cauda equina: Conus extends to the L1-2 level. Conus and cauda equina appear normal. Paraspinal and other soft tissues: Paraspinous soft tissues demonstrate no acute finding. Multi loculated collections/abscesses with surrounding enhancement partially seen within the partially visualized pelvis, better characterized on recent CT. Remainder of the visualized visceral structures otherwise unremarkable. Disc levels: L1-2:  Unremarkable. L2-3: Small left subarticular disc protrusion mildly indents the left ventral thecal sac (series 12, image 16). Borderline mild narrowing of the left lateral recess. Central canal remains widely patent. No foraminal stenosis. L3-4:  Unremarkable. L4-5:  Unremarkable. L5-S1: Left subarticular disc protrusion extends into the left lateral recess, contacting the descending left S1 nerve root (series 12, image 34). Resultant  mild left lateral recess stenosis. Central canal remains patent. No significant foraminal stenosis.  IMPRESSION: 1. No MRI evidence for acute infection within the lumbar spine. 2. Left subarticular disc protrusion at L5-S1, contacting the descending left S1 nerve root in the left lateral recess. 3. Small left subarticular disc protrusion at L2-3 with borderline mild left lateral recess stenosis. 4. Multiloculated collections/abscesses within the partially visualized pelvis, better characterized on recent CT. Electronically Signed   By: Jeannine Boga M.D.   On: 02/13/2022 04:04   ECHOCARDIOGRAM COMPLETE  Result Date: 02/12/2022    ECHOCARDIOGRAM REPORT   Patient Name:   MARINEL PAVEL Date of Exam: 02/12/2022 Medical Rec #:  [id number]        Height:       63.0 in Accession #:    [id number]       Weight:       169.5 lb Date of Birth:  August 28, 1990        BSA:          1.802 m Patient Age:    31 years         BP:           113/87 mmHg Patient Gender: F                HR:           98 bpm. Exam Location:  Inpatient Procedure: 2D Echo, Cardiac Doppler and Color Doppler Indications:   Staph aureus infection  History:       Patient has no prior history of Echocardiogram examinations.                Sepsis.  Sonographer:   Clayton Lefort RDCS (AE) Referring      TRUNG T VU Phys: IMPRESSIONS  1. Left ventricular ejection fraction, by estimation, is 60 to 65%. The left ventricle has normal function. The left ventricle has no regional wall motion abnormalities. Left ventricular diastolic parameters were normal.  2. Right ventricular systolic function is normal. The right ventricular size is normal.  3. The mitral valve is normal in structure. Trivial mitral valve regurgitation. No evidence of mitral stenosis.  4. The aortic valve is normal in structure. Aortic valve regurgitation is not visualized. No aortic stenosis is present. FINDINGS  Left Ventricle: Left ventricular ejection fraction, by estimation, is 60 to 65%. The left ventricle has normal function. The left ventricle has no regional wall motion abnormalities.  The left ventricular internal cavity size was normal in size. There is  no left ventricular hypertrophy. Left ventricular diastolic parameters were normal. Right Ventricle: The right ventricular size is normal. Right vetricular wall thickness was not well visualized. Right ventricular systolic function is normal. Left Atrium: Left atrial size was normal in size. Right Atrium: Right atrial size was normal in size. Pericardium: There is no evidence of pericardial effusion. Mitral Valve: The mitral valve is normal in structure. Trivial mitral valve regurgitation. No evidence of mitral valve stenosis. Tricuspid Valve: The tricuspid valve is normal in structure. Tricuspid valve regurgitation is trivial. Aortic Valve: The aortic valve is normal in structure. Aortic valve regurgitation is not visualized. No aortic stenosis is present. Aortic valve mean gradient measures 4.0 mmHg. Aortic valve peak gradient measures 7.1 mmHg. Aortic valve area, by VTI measures 2.25 cm. Pulmonic Valve: The pulmonic valve was normal in structure. Pulmonic valve regurgitation is not visualized. Aorta: The aortic root and ascending aorta are structurally normal, with no evidence of dilitation. IAS/Shunts: The  atrial septum is grossly normal.  LEFT VENTRICLE PLAX 2D LVIDd:         4.80 cm LVIDs:         3.40 cm LV PW:         0.90 cm LV IVS:        1.10 cm LVOT diam:     1.90 cm LV SV:         49 LV SV Index:   27 LVOT Area:     2.84 cm  RIGHT VENTRICLE             IVC RV Basal diam:  2.30 cm     IVC diam: 1.50 cm RV S prime:     12.40 cm/s TAPSE (M-mode): 2.1 cm LEFT ATRIUM             Index        RIGHT ATRIUM          Index LA diam:        3.80 cm 2.11 cm/m   RA Area:     9.13 cm LA Vol (A2C):   26.4 ml 14.65 ml/m  RA Volume:   15.80 ml 8.77 ml/m LA Vol (A4C):   29.7 ml 16.48 ml/m LA Biplane Vol: 29.3 ml 16.26 ml/m  AORTIC VALVE AV Area (Vmax):    2.24 cm AV Area (Vmean):   2.21 cm AV Area (VTI):     2.25 cm AV Vmax:            133.00 cm/s AV Vmean:          87.900 cm/s AV VTI:            0.218 m AV Peak Grad:      7.1 mmHg AV Mean Grad:      4.0 mmHg LVOT Vmax:         105.00 cm/s LVOT Vmean:        68.600 cm/s LVOT VTI:          0.173 m LVOT/AV VTI ratio: 0.79  AORTA Ao Root diam: 2.90 cm Ao Asc diam:  2.70 cm  SHUNTS Systemic VTI:  0.17 m Systemic Diam: 1.90 cm Kristeen MissPhilip Nahser MD Electronically signed by Kristeen MissPhilip Nahser MD Signature Date/Time: 02/12/2022/4:39:15 PM    Final    VAS US LOWER EXTREMITY VENOUS (DVT)  Result Date: 02/12/2022  Lower Venous DVT Study Patient Name:  Bedelia PersonJAVONNA S Curt  Date of Exam:   02/12/2022 Medical Rec #: [id number]         Accession #:    [id number] Date of Birth: 01-29-1991         Patient Gender: F Patient Age:   [age] years [facility] Procedure:      VAS US LOWER EXTREMITY VENOUS (DVT) Referring Phys: JULIE MACHEN --------------------------------------------------------------------------------  Indications: Pain.  Risk Factors: None identified. Limitations: Poor patient cooperation, patient pain tolerance. Comparison Study: No prior studies. Performing Technologist: Chanda BusingGregory Collins RVT  Examination Guidelines: A complete evaluation includes B-mode imaging, spectral Doppler, color Doppler, and power Doppler as needed of all accessible portions of each vessel. Bilateral testing is considered an integral part of a complete examination. Limited examinations for reoccurring indications may be performed as noted. The reflux portion of the exam is performed with the patient in reverse Trendelenburg.  +---------+---------------+---------+-----------+----------+--------------+ RIGHT    CompressibilityPhasicitySpontaneityPropertiesThrombus Aging +---------+---------------+---------+-----------+----------+--------------+ CFV      Full           Yes  Yes                                 +---------+---------------+---------+-----------+----------+--------------+ SFJ      Full                                                         +---------+---------------+---------+-----------+----------+--------------+ FV Prox  Full                                                        +---------+---------------+---------+-----------+----------+--------------+ FV Mid   Full                                                        +---------+---------------+---------+-----------+----------+--------------+ FV DistalFull                                                        +---------+---------------+---------+-----------+----------+--------------+ PFV      Full                                                        +---------+---------------+---------+-----------+----------+--------------+ POP      Full           Yes      Yes                                 +---------+---------------+---------+-----------+----------+--------------+ PTV      Full                                                        +---------+---------------+---------+-----------+----------+--------------+ PERO     Full                                                        +---------+---------------+---------+-----------+----------+--------------+   +----+---------------+---------+-----------+----------+--------------+ LEFTCompressibilityPhasicitySpontaneityPropertiesThrombus Aging +----+---------------+---------+-----------+----------+--------------+ CFV Full           Yes      Yes                                 +----+---------------+---------+-----------+----------+--------------+    Summary: RIGHT: - There is no evidence of deep vein thrombosis in the lower extremity.  -  No cystic structure found in the popliteal fossa.  LEFT: - No evidence of common femoral vein obstruction.  *See table(s) above for measurements and observations. Electronically signed by Sherald Hess MD on 02/12/2022 at 11:23:51 AM.    Final    CT IMAGE GUIDED DRAINAGE BY PERCUTANEOUS  CATHETER  Result Date: 02/10/2022 INDICATION: History of tubo-ovarian abscess, post CT-guided placement right lower abdominal/pelvic drainage catheter on 02/05/2022. Postprocedural CT scan performed 02/08/2022 demonstrates an unchanged fluid collection within the right lower pelvis and as such request made for placement of an additional percutaneous drainage catheter for infection source control purposes. Additionally, there is request for CT-guided exchange and/or repositioning of the pre-existing drainage catheter into the more dominant component of the residual pelvic abscess EXAM: 1. CT GUIDED RIGHT TRANS GLUTEAL APPROACH TUBO-OVARIAN ABSCESS DRAINAGE CATHETER PLACEMENT 2. CT-GUIDED EXCHANGE AND REPOSITIONING OF EXISTING RIGHT LOWER ABDOMINAL/PELVIC DRAINAGE CATHETER COMPARISON:  CT abdomen pelvis-02/08/2022; 02/04/2022 CT guided right lower abdominal/pelvic abscess drainage catheter placement-02/05/2022 MEDICATIONS: The patient is currently admitted to the hospital and receiving intravenous antibiotics. The antibiotics were administered within an appropriate time frame prior to the initiation of the procedure. ANESTHESIA/SEDATION: Moderate (conscious) sedation was employed during this procedure as administered by the Interventional Radiology RN. A total of Versed 4 mg, Dilaudid 1 mg and Fentanyl 150 mcg was administered intravenously. Moderate Sedation Time: 60 minutes. The patient's level of consciousness and vital signs were monitored continuously by radiology nursing throughout the procedure under my direct supervision. CONTRAST:  None COMPLICATIONS: None immediate. PROCEDURE: RADIATION DOSE REDUCTION: This exam was performed according to the departmental dose-optimization program which includes automated exposure control, adjustment of the mA and/or kV according to patient size and/or use of iterative reconstruction technique. Informed written consent was obtained from the patient after a discussion of  the risks, benefits and alternatives to treatment. The patient was initially placed prone on the CT gantry and a pre procedural CT was performed re-demonstrating the known abscess/fluid collection within the right hemipelvis with dominant component measuring approximately 5.5 x 5.4 cm (image 11, series 2). The procedure was planned. A timeout was performed prior to the initiation of the procedure. The skin overlying the right buttocks was prepped and draped in the usual sterile fashion. The overlying soft tissues were anesthetized with 1% lidocaine with epinephrine. Appropriate trajectory was planned with the use of a 22 gauge spinal needle. An 18 gauge trocar needle was advanced into the abscess/fluid collection and a short Amplatz super stiff wire was coiled within the collection. Appropriate positioning was confirmed with a limited CT scan. The tract was serially dilated allowing placement of a 10 [place] all-purpose drainage catheter. Appropriate positioning was confirmed with a limited postprocedural CT scan. Approximately 65 ml of purulent appearing serous fluid was aspirated. The tube was connected to a drainage bag and secured in place within interrupted suture and a Stat Lock device. A dressing was applied. The patient was then repositioned supine on the CT gantry and noncontrast images were obtained of the lower pelvis and pre-existing right anterior approach lower abdominal/pelvic drainage catheter. Attempts were made to retract the existing drainage catheter into the dominant component of the residual collection however this proved uncomfortable for the patient. As such, the external portion of the drainage catheter was prepped and draped in the usual sterile fashion. The external portion of the drainage catheter was cut and cannulated with a short Amplatz wire. Existing 12 French drainage catheter was then exchanged for a new 12 [place] percutaneous catheter  which under intermittent CT guidance was  repositioned into the dominant residual component of the right lower quadrant collection (series 11). The drainage catheter was connected to a gravity bag and secured in place within interrupted suture and a Stat Lock device. Patient tolerated the above procedures well (though did require a generous amount of conscious sedation medications) without immediate postprocedural complication. IMPRESSION: 1. Successful CT guided placement of a 10 [place] all purpose drain catheter into the undrained pelvic abscess via right trans gluteal approach with aspiration of 65 mL of purulent fluid. Samples were sent to the laboratory as requested by the ordering clinical team. 2. Successful CT guided exchange and repositioning of pre-existing 12 French right anterior abdominal/pelvic drainage catheter with end now coiled and locked within the residual anteriorly located abscess cavity. Electronically Signed   By: Sandi Mariscal M.D.   On: 02/10/2022 16:33   CT ABSCESS CATH EXCHANGE  Result Date: 02/10/2022 INDICATION: History of tubo-ovarian abscess, post CT-guided placement right lower abdominal/pelvic drainage catheter on 02/05/2022. Postprocedural CT scan performed 02/08/2022 demonstrates an unchanged fluid collection within the right lower pelvis and as such request made for placement of an additional percutaneous drainage catheter for infection source control purposes. Additionally, there is request for CT-guided exchange and/or repositioning of the pre-existing drainage catheter into the more dominant component of the residual pelvic abscess EXAM: 1. CT GUIDED RIGHT TRANS GLUTEAL APPROACH TUBO-OVARIAN ABSCESS DRAINAGE CATHETER PLACEMENT 2. CT-GUIDED EXCHANGE AND REPOSITIONING OF EXISTING RIGHT LOWER ABDOMINAL/PELVIC DRAINAGE CATHETER COMPARISON:  CT abdomen pelvis-02/08/2022; 02/04/2022 CT guided right lower abdominal/pelvic abscess drainage catheter placement-02/05/2022 MEDICATIONS: The patient is currently admitted to the  hospital and receiving intravenous antibiotics. The antibiotics were administered within an appropriate time frame prior to the initiation of the procedure. ANESTHESIA/SEDATION: Moderate (conscious) sedation was employed during this procedure as administered by the Interventional Radiology RN. A total of Versed 4 mg, Dilaudid 1 mg and Fentanyl 150 mcg was administered intravenously. Moderate Sedation Time: 60 minutes. The patient's level of consciousness and vital signs were monitored continuously by radiology nursing throughout the procedure under my direct supervision. CONTRAST:  None COMPLICATIONS: None immediate. PROCEDURE: RADIATION DOSE REDUCTION: This exam was performed according to the departmental dose-optimization program which includes automated exposure control, adjustment of the mA and/or kV according to patient size and/or use of iterative reconstruction technique. Informed written consent was obtained from the patient after a discussion of the risks, benefits and alternatives to treatment. The patient was initially placed prone on the CT gantry and a pre procedural CT was performed re-demonstrating the known abscess/fluid collection within the right hemipelvis with dominant component measuring approximately 5.5 x 5.4 cm (image 11, series 2). The procedure was planned. A timeout was performed prior to the initiation of the procedure. The skin overlying the right buttocks was prepped and draped in the usual sterile fashion. The overlying soft tissues were anesthetized with 1% lidocaine with epinephrine. Appropriate trajectory was planned with the use of a 22 gauge spinal needle. An 18 gauge trocar needle was advanced into the abscess/fluid collection and a short Amplatz super stiff wire was coiled within the collection. Appropriate positioning was confirmed with a limited CT scan. The tract was serially dilated allowing placement of a 10 [place] all-purpose drainage catheter. Appropriate positioning was  confirmed with a limited postprocedural CT scan. Approximately 65 ml of purulent appearing serous fluid was aspirated. The tube was connected to a drainage bag and secured in place within interrupted suture and a Stat Lock device.  A dressing was applied. The patient was then repositioned supine on the CT gantry and noncontrast images were obtained of the lower pelvis and pre-existing right anterior approach lower abdominal/pelvic drainage catheter. Attempts were made to retract the existing drainage catheter into the dominant component of the residual collection however this proved uncomfortable for the patient. As such, the external portion of the drainage catheter was prepped and draped in the usual sterile fashion. The external portion of the drainage catheter was cut and cannulated with a short Amplatz wire. Existing 12 French drainage catheter was then exchanged for a new 12 French percutaneous catheter which under intermittent CT guidance was repositioned into the dominant residual component of the right lower quadrant collection (series 11). The drainage catheter was connected to a gravity bag and secured in place within interrupted suture and a Stat Lock device. Patient tolerated the above procedures well (though did require a generous amount of conscious sedation medications) without immediate postprocedural complication. IMPRESSION: 1. Successful CT guided placement of a 10 [place] all purpose drain catheter into the undrained pelvic abscess via right trans gluteal approach with aspiration of 65 mL of purulent fluid. Samples were sent to the laboratory as requested by the ordering clinical team. 2. Successful CT guided exchange and repositioning of pre-existing 12 French right anterior abdominal/pelvic drainage catheter with end now coiled and locked within the residual anteriorly located abscess cavity. Electronically Signed   By: Sandi Mariscal M.D.   On: 02/10/2022 16:33   CT ABDOMEN PELVIS W  CONTRAST  Result Date: 02/08/2022 CLINICAL DATA:  Sepsis EXAM: CT ABDOMEN AND PELVIS WITH CONTRAST TECHNIQUE: Multidetector CT imaging of the abdomen and pelvis was performed using the standard protocol following bolus administration of intravenous contrast. RADIATION DOSE REDUCTION: This exam was performed according to the departmental dose-optimization program which includes automated exposure control, adjustment of the mA and/or kV according to patient size and/or use of iterative reconstruction technique. CONTRAST:  121mL OMNIPAQUE IOHEXOL 300 MG/ML  SOLN COMPARISON:  Previous studies including the CT done on 02-17-2022 FINDINGS: Lower chest: Small bilateral pleural effusions are seen more so on the right side. There are linear patchy infiltrates in the lingula and both lower lobes suggesting atelectasis/pneumonia. Hepatobiliary: No focal abnormality is seen in the liver. There is no dilation of bile ducts. Gallbladder is unremarkable. Small ascites is noted in the perihepatic region. Pancreas: No focal abnormality is seen. Spleen: Unremarkable. Adrenals/Urinary Tract: Adrenals are not enlarged. There is no hydronephrosis. There are no renal or ureteral stones. Urinary bladder is unremarkable. There is 3 mm low-density in the midportion of left kidney which is too small to be characterized. Stomach/Bowel: Stomach is not distended. Small bowel loops are not dilated. Appendix is not seen. There is no significant wall thickening in the colon. There is interval placement of percutaneous drainage catheter with its tip in the right lower abdomen. There is interval decrease in size of the complex fluid collection in the right lower quadrant. There is 4 cm loculated fluid collection slightly to the left of midline at the level of pelvic inlet. There is 7.2 x 5.3 x 4.9 cm loculated fluid collection with minimally enhancing margin in the cul-de-sac in the pelvis. There are no pockets of air within these loculated fluid  collections. There is stranding in the fat planes in the lower abdomen and pelvis. Vascular/Lymphatic: Vascular structures are unremarkable. Reproductive: Uterus is to the right of midline. Other: There is no pneumoperitoneum. Musculoskeletal: No acute findings are seen. IMPRESSION:  Interval placement of percutaneous drainage catheter within air-fluid collection in the right lower abdomen with interval decrease in size. There is 7.2 cm loculated fluid collection in the cul-de-sac in the pelvis with slightly enhancing margins. There is another 4 cm loculated fluid collection in the left side of the pelvis. These may suggest loculated serous fluid collections or abscesses. Small ascites is noted in the perihepatic region. There is no evidence of intestinal obstruction or pneumoperitoneum. There is no hydronephrosis. Small bilateral pleural effusions. There are linear patchy infiltrates in both lower lung fields suggesting atelectasis/pneumonia. Electronically Signed   By: Elmer Picker M.D.   On: 02/08/2022 09:41   CT IMAGE GUIDED DRAINAGE BY PERCUTANEOUS CATHETER  Result Date: 02/05/2022 INDICATION: PID with large abdominopelvic fluid collection. EXAM: CT GUIDED DRAINAGE CATHETER PLACEMENT INTO ABDOMINOPELVIC FLUID COLLECTION RADIATION DOSE REDUCTION: This exam was performed according to the departmental dose-optimization program which includes automated exposure control, adjustment of the mA and/or kV according to patient size and/or use of iterative reconstruction technique. COMPARISON:  CT AP, 02/04/2022 MEDICATIONS: The patient is currently admitted to the hospital and receiving intravenous antibiotics. The antibiotics were administered within an appropriate time frame prior to the initiation of the procedure. ANESTHESIA/SEDATION: Moderate (conscious) sedation was employed during this procedure. A total of Versed 3 mg and Fentanyl 75 mcg was administered intravenously. Additionally, 1 mg Dilaudid was  administered IV. Moderate Sedation Time: 19 minutes. The patient's level of consciousness and vital signs were monitored continuously by radiology nursing throughout the procedure under my direct supervision. CONTRAST:  None COMPLICATIONS: None immediate. PROCEDURE: Informed written consent was obtained from the patient and/or patient's representative after a discussion of the risks, benefits and alternatives to treatment. The patient was placed supine on the CT gantry and a pre procedural CT was performed re-demonstrating the known abscess/fluid collection within the lower abdomen/pelvis. The procedure was planned. A timeout was performed prior to the initiation of the procedure. The RIGHT lower quadrant was prepped and draped in the usual sterile fashion. The overlying soft tissues were anesthetized with 1% lidocaine with epinephrine. Appropriate trajectory was planned with the use of a 22 gauge spinal needle. An 18 gauge trocar needle was advanced into the abscess/fluid collection and a short Amplatz super stiff wire was coiled within the collection. Appropriate positioning was confirmed with a limited CT scan. The tract was serially dilated allowing placement of a 12 [place] all-purpose drainage catheter. Appropriate positioning was confirmed with a limited postprocedural CT scan. 5 mL ml of serous fluid was aspirated and submitted for analysis. The tube was connected to a drainage bag and sutured in place. A dressing was placed. The patient tolerated the procedure well without immediate post procedural complication. IMPRESSION: Successful CT guided placement of a 12 Fr drainage catheter into the abdominopelvic collection with aspiration of serous fluid, as above. Samples were sent to the laboratory as requested by the ordering clinical team. Michaelle Birks, MD [facility] Electronically Signed   By: Michaelle Birks M.D.   On: 02/05/2022 18:08   CT ABDOMEN  PELVIS WO CONTRAST  Result Date: 02/04/2022 CLINICAL DATA:  Abdominal pain, acute, nonlocalized septic shock, abnormal uterine US, concern for PID EXAM: CT ABDOMEN AND PELVIS WITHOUT CONTRAST TECHNIQUE: Multidetector CT imaging of the abdomen and pelvis was performed following the standard protocol without IV contrast. RADIATION DOSE REDUCTION: This exam was performed according to the departmental dose-optimization program which includes automated exposure control, adjustment of the mA and/or  kV according to patient size and/or use of iterative reconstruction technique. COMPARISON:  Pelvic ultrasound today FINDINGS: Lower chest: Pleural thickening noted peripherally in the right lower hemithorax, possibly representing trace loculated pleural effusion. No confluent airspace opacities. Hepatobiliary: No focal hepatic abnormality. Gallbladder unremarkable. Pancreas: No focal abnormality or ductal dilatation. Spleen: No focal abnormality.  Normal size. Adrenals/Urinary Tract: No adrenal abnormality. No focal renal abnormality. No stones or hydronephrosis. Urinary bladder is unremarkable. Stomach/Bowel: Wall thickening noted in the cecum which is immediately adjacent to the large pelvic fluid collection described below. This could reflect colitis or be secondarily inflamed. Stomach and small bowel grossly unremarkable. Vascular/Lymphatic: No evidence of aneurysm or adenopathy. Reproductive: Large complex cystic area or fluid collection seen in the pelvis extending from the cul-de-sac superiorly into the right lower quadrant adjacent to the cecum. This measures approximately 12 x 8 cm on image 64 of series 3. Internal areas of increased density. This could reflect blood or abscess/PID. Uterus grossly unremarkable on this noncontrast study. Ovaries not definitively visualized. Other: There is free fluid adjacent to the liver and spleen. Musculoskeletal: No acute bony abnormality. IMPRESSION: Complex process in the pelvis  including what appears to be a large complex fluid collection measuring 12 x 8 cm extending from the cul-de-sac superiorly to the cecum which could reflect blood products or abscess. This also could conceivably be related to and ovary. Neither ovary definitively identified. Wall thickening in the cecum could reflect colitis or secondary inflammation related to the pelvic process. Perihepatic and perisplenic ascites. Possible trace loculated right pleural effusion peripherally in the right lower hemithorax. Electronically Signed   By: Charlett Nose M.D.   On: 02/04/2022 22:29   US PELVIC COMPLETE WITH TRANSVAGINAL  Addendum Date: 02/04/2022   ADDENDUM REPORT: 02/04/2022 13:44 ADDENDUM: In light of negative quantitative beta HCG and no documented pregnancy the possibility of infectious causes for the above findings may be more likely but findings are essentially quite nonspecific at this time. Even noncontrast CT could prove beneficial to allow for assessment of pelvic fluid to determine whether this represents frank hemoperitoneum and to assess for any associated inflammatory changes that may be present not well assessed on ultrasound. These results were called by telephone at the time of interpretation on 02/04/2022 at 1:44 pm to provider [facility] , who verbally acknowledged these results. Electronically Signed   By: Donzetta Kohut M.D.   On: 02/04/2022 13:44   Result Date: 02/04/2022 CLINICAL DATA:  A 31 year old female presents with pelvic pain and bleeding. Patient reports positive pregnancy but urine pregnancy currently negative by report and beta HCG is currently pending. EXAM: TRANSABDOMINAL AND TRANSVAGINAL ULTRASOUND OF PELVIS TECHNIQUE: Both transabdominal and transvaginal ultrasound examinations of the pelvis were performed. Transabdominal technique was performed for global imaging of the pelvis including uterus, ovaries, adnexal regions, and pelvic cul-de-sac. It was necessary to proceed with  endovaginal exam following the transabdominal exam to visualize the endometrium and adnexa. COMPARISON:  No recent imaging is available for comparison. FINDINGS: Uterus Measurements: 12.6 x 8.0 x 8.5 (volume = 450 cc) cm. = volume: 450 mL. Heterogeneous uterus with thickened endometrium. Cystic area in the endometrial canal that measures 4.5 x 2.6 x 4.4 cm. This cystic characteristics and central increased echogenicity. Central increased echogenicity the without discrete finding that would suggest embryo with very limited assessment due to patient discomfort and clinical condition. Endometrium Thickness: 3.3 cm. Thickened with cystic area in the fundus of the uterus. Right ovary Measurements: 3.2 x  2.7 x 2.1 (volume = 9.5 cc) cm = volume: 9.5 mL. Normal appearance/no adnexal mass. Left ovary Not visible, in the area of the LEFT adnexa is heterogeneous material that has the appearance of blood products but conforms to the cul-de-sac and LEFT adnexa. Other findings Moderate heterogeneous fluid in the LEFT adnexa primarily but also tracking along the RIGHT uterus. IMPRESSION: Unusual cystic area in the endometrial canal. If this patient has any history of recent positive pregnancy test this could represent a failed gestation with retained products of conception. Pseudo gestational sac with areas of internal hemorrhage in the setting of ruptured ectopic pregnancy is also considered. Heterogeneous appearance of material in the cul-de-sac could also potentially represent sequela of pyogenic infection though this is lower on the differential at this time based on appearance. If the patient could tolerate further evaluation with cross-sectional imaging CT could potentially be helpful. Ultimately gyn consultation is suggested. Critical Value/emergent results were called by telephone at the time of interpretation on 02/04/2022 at 11:01 am to provider [facility] , who verbally acknowledged these results. Electronically  Signed: By: Donzetta Kohut M.D. On: 02/04/2022 11:01   DG Chest Port 1 View  Result Date: 02/04/2022 CLINICAL DATA:  31 year old female with history of lower abdominal pain and vaginal bleeding 3 days ago. Fever. Low blood pressure. EXAM: PORTABLE CHEST 1 VIEW COMPARISON:  Chest x-ray 08/09/2021. FINDINGS: Lung volumes are slightly low. No consolidative airspace disease. No pleural effusions. No pneumothorax. No evidence of pulmonary edema. Heart size is normal. The patient is rotated to the right on today's exam, resulting in distortion of the mediastinal contours and reduced diagnostic sensitivity and specificity for mediastinal pathology. IMPRESSION: 1. No radiographic evidence of acute cardiopulmonary disease. Electronically Signed   By: Trudie Reed M.D.   On: 02/04/2022 10:55    Scheduled Meds:  Chlorhexidine Gluconate Cloth  6 each Topical Daily   doxycycline  100 mg Oral Q12H   enoxaparin (LOVENOX) injection  40 mg Subcutaneous Q24H   feeding supplement  1 Container Oral TID BM   feeding supplement (PROSource TF)  45 mL Per Tube BID   gabapentin  300 mg Oral TID   lidocaine  1 patch Transdermal Daily   linezolid  600 mg Oral Q12H   methocarbamol  1,000 mg Oral Q6H   metroNIDAZOLE  500 mg Oral Q12H   multivitamin with minerals  1 tablet Oral Daily   nicotine  21 mg Transdermal Daily   oxyCODONE  20 mg Oral Q12H   pantoprazole  40 mg Oral Daily   polyethylene glycol  17 g Oral BID   senna-docusate  1 tablet Oral QHS   sodium chloride flush  10-40 mL Intracatheter Q12H   traZODone  50 mg Oral QHS   Continuous Infusions:  cefTRIAXone (ROCEPHIN)  IV 2 g (02/24/22 2207)   feeding supplement (OSMOLITE 1.5 CAL) Stopped (02/25/22 1505)   PRN Meds:bisacodyl, [DISCONTINUED] diphenhydrAMINE **OR** diphenhydrAMINE, HYDROmorphone, hydrOXYzine, lip balm, menthol-cetylpyridinium, ondansetron **OR** ondansetron (ZOFRAN) IV, phenol, polyvinyl alcohol, prochlorperazine, simethicone, sodium  chloride flush    LOS: 17 days   Jaynie Collins, MD 02/26/2022 9:59 AM

## 2022-02-27 ENCOUNTER — Inpatient Hospital Stay (HOSPITAL_COMMUNITY): Payer: Medicaid Other

## 2022-02-27 LAB — CBC WITH DIFFERENTIAL/PLATELET
Abs Immature Granulocytes: 0.05 10*3/uL (ref 0.00–0.07)
Basophils Absolute: 0.1 10*3/uL (ref 0.0–0.1)
Basophils Relative: 1 %
Eosinophils Absolute: 0.3 10*3/uL (ref 0.0–0.5)
Eosinophils Relative: 3 %
HCT: 28.5 % — ABNORMAL LOW (ref 36.0–46.0)
Hemoglobin: 9 g/dL — ABNORMAL LOW (ref 12.0–15.0)
Immature Granulocytes: 1 %
Lymphocytes Relative: 30 %
Lymphs Abs: 2.9 10*3/uL (ref 0.7–4.0)
MCH: 28.8 pg (ref 26.0–34.0)
MCHC: 31.6 g/dL (ref 30.0–36.0)
MCV: 91.3 fL (ref 80.0–100.0)
Monocytes Absolute: 0.8 10*3/uL (ref 0.1–1.0)
Monocytes Relative: 8 %
Neutro Abs: 5.7 10*3/uL (ref 1.7–7.7)
Neutrophils Relative %: 57 %
Platelets: 496 10*3/uL — ABNORMAL HIGH (ref 150–400)
RBC: 3.12 MIL/uL — ABNORMAL LOW (ref 3.87–5.11)
RDW: 20.2 % — ABNORMAL HIGH (ref 11.5–15.5)
WBC: 9.8 10*3/uL (ref 4.0–10.5)
nRBC: 0 % (ref 0.0–0.2)

## 2022-02-27 LAB — GLUCOSE, CAPILLARY
Glucose-Capillary: 89 mg/dL (ref 70–99)
Glucose-Capillary: 97 mg/dL (ref 70–99)

## 2022-02-27 LAB — CULTURE, BLOOD (ROUTINE X 2)
Culture: NO GROWTH
Culture: NO GROWTH
Special Requests: ADEQUATE

## 2022-02-27 MED ORDER — CYCLOBENZAPRINE HCL 5 MG PO TABS: 5.0000 mg | ORAL_TABLET | Freq: Three times a day (TID) | ORAL | Status: DC | PRN

## 2022-02-27 MED ORDER — CYCLOBENZAPRINE HCL 5 MG PO TABS
5.0000 mg | ORAL_TABLET | Freq: Three times a day (TID) | ORAL | Status: DC
  Administered 2022-02-27 – 2022-02-28 (×3): 5 mg via ORAL
  Filled 2022-02-27 (×4): qty 1

## 2022-02-27 MED ORDER — KETOROLAC TROMETHAMINE 15 MG/ML IJ SOLN
15.0000 mg | Freq: Once | INTRAMUSCULAR | Status: AC
  Administered 2022-02-27: 15 mg via INTRAVENOUS
  Filled 2022-02-27: qty 1

## 2022-02-27 MED ORDER — HYDROMORPHONE HCL 1 MG/ML IJ SOLN
0.5000 mg | Freq: Once | INTRAMUSCULAR | Status: AC
  Administered 2022-02-27: 0.5 mg via INTRAVENOUS
  Filled 2022-02-27: qty 0.5

## 2022-02-27 NOTE — Progress Notes (Signed)
Patient ID: Paula Lucas, female   DOB: 1990/09/27, 31 y.o.   MRN: [id number]   Assessment/Plan: Principal Problem:   Pelvic abscess  Active Problems:   Sepsis (HCC)   MRSA (methicillin resistant staph aureus) culture positive   Hepatic abscess   Hx of Fitz-Hugh-Curtis syndrome   Protein-calorie malnutrition (HCC)   Depression  Pelvic abscess/Hepatic abscess Patient has multiple abscesses in her abdomen and pelvis related to prior PID with current GC and Chlamydia negative. Patient now s/p ex lap with pelvic and abdominal wash out on 02/16/22 Wound vac placed 02/23/22 by WOC Nurse, appreciate their help and ongoing care for this patient.  Please consult General Surgery about follow up plans about wound vac as we usually do not place these for our surgeries and I am not aware of the necessary follow up needed after discharge. Cultures from surgery are sterile. Her WBC is in the normal range.  She remains afebrile.  Reports mild improvement in her abdominal pain. Positive flatus and BM. Patient wants to go home. Continue Linezolid, Rocephin, Doxycycline, Flagyl--appreciate ID input. Please discuss appropriate home regimen with ID if patient is being discharged. Continue pain medications as needed Patient is stable for discharge from a GYN perspective.  She may follow-up in our office and this has been arranged. If further surgery is planned, would need GYN ONC consult.   Cardiopulmonary issues 02/22/2022 CT angio chest negative for PR, but with bilateral pleural effusions, and near complete atelectasis if left lower lobe. 02/26/2022 X-ray shows low lung volumes with bibasilar opacities suggesting atelectasis and/or pleural effusions, not significantly changed from previous study on 7/2/23023.   Continued chest pain is likely due to work with breathing and aforementioned findings. On Lasix as per primary team, will continue this as prescribed  Continue oxygen as needed, recommend incentive  spirometry, ambulation, PT   Depression Recommend starting antidepressant, discussed with team.  TOC consult placed   Severe protein calorie malnutrition She is now status post core track placement with tube feeds which have now been DC'd.  Continue advancing her diet.    Sepsis [facility])  She is not currently actively septic Thoracic MRI and TTE negative for infection.   MRSA (methicillin resistant staph aureus) culture positive On Linezolid   Iron deficiency anemia No active bleeding, s/p Venofer   Disposition Pending plans for her wound vac, antibiotics etc. Cautioned about risk of leaving AMA and getting worse again, getting septic again  or worse and needing re-admission or other intervention.    Subjective: Interval History: Patient is quite somnolent this morning.  She reports she is still having some mild abdominal pain.  Objective: Vital signs in last 24 hours: Temp:  [98 F (36.7 C)-99.9 F (37.7 C)] 99.3 F (37.4 C) (07/08 0757) Pulse Rate:  [115-130] 115 (07/08 0757) Resp:  [17-25] 17 (07/08 0757) BP: (94-110)/(68-80) 94/71 (07/08 0757) SpO2:  [92 %-97 %] 93 % (07/08 0757) Weight:  [76.8 kg] 76.8 kg (07/08 0500)  Intake/Output from previous day: 07/07 0701 - 07/08 0700 In: 10 [I.V.:10] Out: 600 [Urine:600] Intake/Output this shift: No intake/output data recorded.  Physical exam:  General appearance: alert, cooperative, and appears stated age Head: Normocephalic, without obvious abnormality, atraumatic Lungs: Normal effort Heart: regular rate, regular rhythm Abdomen: Far less distended, diffusely tender  Incision: Dressing is dry and intact.  2 JP drains present with serosanguineous fluid. Extremities: Homans sign is negative, no sign of DVT Skin: Skin color, texture, turgor normal. No rashes or lesions  Neurologic: Grossly normal  Results for orders placed or performed during the hospital encounter of 02/08/22 (from the past 24 hour(s))  CBC with  Differential/Platelet     Status: Abnormal   Collection Time: 02/27/22  5:07 AM  Result Value Ref Range   WBC 9.8 4.0 - 10.5 K/uL   RBC 3.12 (L) 3.87 - 5.11 MIL/uL   Hemoglobin 9.0 (L) 12.0 - 15.0 g/dL   HCT 62.9 (L) 52.8 - 41.3 %   MCV 91.3 80.0 - 100.0 fL   MCH 28.8 26.0 - 34.0 pg   MCHC 31.6 30.0 - 36.0 g/dL   RDW 24.4 (H) 01.0 - 27.2 %   Platelets 496 (H) 150 - 400 K/uL   nRBC 0.0 0.0 - 0.2 %   Neutrophils Relative % 57 %   Neutro Abs 5.7 1.7 - 7.7 K/uL   Lymphocytes Relative 30 %   Lymphs Abs 2.9 0.7 - 4.0 K/uL   Monocytes Relative 8 %   Monocytes Absolute 0.8 0.1 - 1.0 K/uL   Eosinophils Relative 3 %   Eosinophils Absolute 0.3 0.0 - 0.5 K/uL   Basophils Relative 1 %   Basophils Absolute 0.1 0.0 - 0.1 K/uL   Immature Granulocytes 1 %   Abs Immature Granulocytes 0.05 0.00 - 0.07 K/uL  Glucose, capillary     Status: None   Collection Time: 02/27/22  6:43 AM  Result Value Ref Range   Glucose-Capillary 89 70 - 99 mg/dL  Glucose, capillary     Status: None   Collection Time: 02/27/22  8:01 AM  Result Value Ref Range   Glucose-Capillary 97 70 - 99 mg/dL    Studies/Results: DG CHEST PORT 1 VIEW  Result Date: 02/25/2022 CLINICAL DATA:  Hypoxia EXAM: PORTABLE CHEST 1 VIEW COMPARISON:  CT examination dated February 22, 2022 FINDINGS: The heart size and mediastinal contours are within normal limits. Low lung volumes with bibasilar opacities suggesting atelectasis and/or pleural effusions, not significantly changed. The visualized skeletal structures are unremarkable. Feeding tube coursing below the diaphragm, distal tip not included. Right PICC with distal tip in the SVC, unchanged. IMPRESSION: 1.  Lines and tubes are unchanged. 2. Low lung volumes with bibasilar opacities suggesting atelectasis and/or effusions, unchanged. Electronically Signed   By: Larose Hires D.O.   On: 02/25/2022 08:50   CT Angio Chest Pulmonary Embolism (PE) W or WO Contrast  Result Date: 02/22/2022 CLINICAL DATA:   Pulmonary embolism (PE) suspected, high prob Acute hypoxic respiratory failure, postsurgical. EXAM: CT ANGIOGRAPHY CHEST WITH CONTRAST TECHNIQUE: Multidetector CT imaging of the chest was performed using the standard protocol during bolus administration of intravenous contrast. Multiplanar CT image reconstructions and MIPs were obtained to evaluate the vascular anatomy. RADIATION DOSE REDUCTION: This exam was performed according to the departmental dose-optimization program which includes automated exposure control, adjustment of the mA and/or kV according to patient size and/or use of iterative reconstruction technique. CONTRAST:  69mL OMNIPAQUE IOHEXOL 350 MG/ML SOLN COMPARISON:  Chest radiograph yesterday. Thoracic spine MRI yesterday. Lung bases from recent abdominal CT reviewed. FINDINGS: Cardiovascular: There are no filling defects within the pulmonary arteries to suggest pulmonary embolus. No aortic dissection or acute aortic findings. Common origin of brachiocephalic and left common carotid artery, variant arch anatomy. The heart is normal in size. Trace pericardial fluid anteriorly. Mediastinum/Nodes: Feeding tube decompresses the esophagus. No mediastinal adenopathy. No bulky hilar adenopathy. No suspicious axillary adenopathy. Lungs/Pleura: Moderate bilateral pleural effusions. There is associated compressive atelectasis. This includes near complete atelectasis of  the left lower lobe. No features of pulmonary edema. The trachea and central bronchi are patent. Upper Abdomen: Small amount of perihepatic fluid adjacent to the anterior right hepatic lobe, also seen on prior abdominal CT, series 6, image 89, not significantly changed. Musculoskeletal: There are no acute or suspicious osseous abnormalities. Mild body wall edema. Review of the MIP images confirms the above findings. IMPRESSION: 1. No pulmonary embolus. 2. Moderate bilateral pleural effusions with associated compressive atelectasis. This  includes near complete atelectasis of the left lower lobe. 3. Small amount of perihepatic fluid, also seen on prior abdominal CT. This is not well assessed on the current exam due to phase of contrast. Electronically Signed   By: Narda Rutherford M.D.   On: 02/22/2022 00:50   DG CHEST PORT 1 VIEW  Result Date: 02/21/2022 CLINICAL DATA:  Dyspnea EXAM: PORTABLE CHEST 1 VIEW COMPARISON:  02/20/2022 FINDINGS: Feeding catheter is noted extending into the stomach. Right-sided PICC is noted in satisfactory position. Cardiac shadow is stable. Persistent left retrocardiac density is noted. Mild right basilar atelectasis is noted as well. Small effusions are seen. Stable vascular congestion is noted. IMPRESSION: Overall appearance of the chest is stable from the prior exam. Electronically Signed   By: Alcide Clever M.D.   On: 02/21/2022 23:12   MR THORACIC SPINE W WO CONTRAST  Result Date: 02/21/2022 CLINICAL DATA:  31 year old female. History of MRSA, PID. Pelvic and abdominal abscesses. EXAM: MRI THORACIC WITHOUT AND WITH CONTRAST TECHNIQUE: Multiplanar and multiecho pulse sequences of the thoracic spine were obtained without and with intravenous contrast. CONTRAST:  7.39mL GADAVIST GADOBUTROL 1 MMOL/ML IV SOLN COMPARISON:  Lumbar MRI 02/12/2022. CT Abdomen and Pelvis 02/14/2022. FINDINGS: Limited cervical spine imaging: Negative aside from mild straightening and reversal of cervical lordosis. Thoracic spine segmentation: Appears to be normal and concordant with the lumbar numbering last month. Alignment: Maintained thoracic vertebral height and alignment. Relatively normal kyphosis. Vertebrae: No marrow edema or evidence of acute osseous abnormality. Somewhat generalized decreased T1 marrow signal in the visible spine and ribs may reflect red marrow reactivation. Cord: No abnormal intradural enhancement or dural thickening. No intraspinal fluid collection is evident. Thoracic spinal cord signal and morphology are  within normal limits. Paraspinal and other soft tissues: Bilateral pleural effusions and compressed lung parenchyma similar to the recent CT Abdomen and Pelvis. Negative visible kidneys. Thoracic paraspinal soft tissues remain within normal limits. Thoracic epidural space is within normal limits. Disc levels: Maintained normal thoracic intervertebral disc signal and morphology throughout. There is mild bilateral facet hypertrophy at T9-T10. No thoracic spinal stenosis. No significant thoracic neural foraminal stenosis. IMPRESSION: 1. No evidence of thoracic spinal infection. Normal thoracic discs. No thoracic spinal stenosis or significant neural impingement. 2. Bilateral pleural effusions and compressed lung parenchyma similar to the recent CT Abdomen and Pelvis. Electronically Signed   By: Odessa Fleming M.D.   On: 02/21/2022 10:15   DG CHEST PORT 1 VIEW  Result Date: 02/20/2022 CLINICAL DATA:  Shortness of breath EXAM: PORTABLE CHEST - 1 VIEW COMPARISON:  02/18/2022 FINDINGS: Unchanged mild cardiomegaly and mild pulmonary vascular congestion. Bibasilar opacities likely combination of small pleural effusions with adjacent atelectasis. Feeding tube terminates in the left upper quadrant. Right upper extremity PICC terminates at the cavoatrial junction. IMPRESSION: 1. Mild cardiomegaly. 2. Mild pulmonary vascular congestion. 3. Small bilateral pleural effusions with adjacent atelectasis. Electronically Signed   By: Acquanetta Belling M.D.   On: 02/20/2022 09:30   DG Abd Portable 1V  Result  Date: 02/19/2022 CLINICAL DATA:  Provided history: Encounter for feeding tube placement. EXAM: PORTABLE ABDOMEN - 1 VIEW COMPARISON:  Abdominal radiograph 02/18/2022. FINDINGS: An enteric tube passes below the level left hemidiaphragm with tip projecting in the expected location of the pylorus or first portion of the duodenum. Nonobstructive bowel gas pattern within the relies abdomen. No acute bony abnormality identified. IMPRESSION:  Enteric tube present with tip projecting in the expected location of the pylorus or first portion of the duodenum. Nonspecific, nonobstructive bowel gas pattern within the visualized abdomen. Electronically Signed   By: Jackey Loge D.O.   On: 02/19/2022 14:20   DG CHEST PORT 1 VIEW  Result Date: 02/18/2022 CLINICAL DATA:  Central line placement EXAM: PORTABLE CHEST 1 VIEW COMPARISON:  Radiograph 09/06/2021 FINDINGS: Right upper extremity PICC tip crosses the midline, across the brachiocephalic confluence, tip overlies the left brachiocephalic vein. Borderline enlarged cardiac silhouette. Bibasilar consolidations, left greater than right. Small bilateral pleural effusions. Low lung volumes. No pneumothorax. No acute osseous abnormality. IMPRESSION: Right upper extremity PICC tip courses across the brachiocephalic confluence, tip overlying the left brachiocephalic vein. Recommend repositioning. Bilateral pleural effusions with adjacent bibasilar atelectasis, left greater than right. Low lung volumes. These results will be called to the ordering clinician or representative by the Radiologist Assistant, and communication documented in the PACS or [employer]. Electronically Signed   By: Caprice Renshaw M.D.   On: 02/18/2022 18:18   [place] EKG SITE RITE  Result Date: 02/18/2022 If Site Rite image not attached, placement could not be confirmed due to current cardiac rhythm.  DG Abd Portable 1V  Result Date: 02/18/2022 CLINICAL DATA:  Abdominal distension. EXAM: PORTABLE ABDOMEN - 1 VIEW COMPARISON:  Abdominal CT February 14, 2022 FINDINGS: Drainage catheters again seen in the lower abdomen/pelvis. No significant change in the appearance of the bowel pattern. No evidence of small-bowel obstruction. IMPRESSION: Stable appearance of the bowel pattern. Electronically Signed   By: Ted Mcalpine M.D.   On: 02/18/2022 12:52   CT ABDOMEN PELVIS W CONTRAST  Result Date: 02/14/2022 CLINICAL DATA:  Follow-up  intra-abdominal abscesses. EXAM: CT ABDOMEN AND PELVIS WITH CONTRAST TECHNIQUE: Multidetector CT imaging of the abdomen and pelvis was performed using the standard protocol following bolus administration of intravenous contrast. RADIATION DOSE REDUCTION: This exam was performed according to the departmental dose-optimization program which includes automated exposure control, adjustment of the mA and/or kV according to patient size and/or use of iterative reconstruction technique. CONTRAST:  OMNIPAQUE IOHEXOL 300 MG/ML  SOLN COMPARISON:  CT scan 02/08/2022 FINDINGS: Lower chest: Persistent moderate-sized pleural effusions with overlying atelectasis. Hepatobiliary: Small residual lentiform rim enhancing abscess at the dome of the liver anteriorly measuring a maximum of 5.6 x 1.1 cm. This previously measured approximately 9.3 x 1.1 cm. Inferiorly there is a persistent perihepatic abscess. It measures approximately 9.5 x 3.1 cm and previously measured 10.1 x 3.7 cm. No worrisome hepatic lesions or intrahepatic biliary dilatation. The gallbladder is unremarkable. No common bile duct dilatation. Pancreas: No mass, inflammation or ductal dilatation. Spleen: Normal size.  No focal lesions. Adrenals/Urinary Tract: Adrenal glands and kidneys are unremarkable and stable. The bladder is grossly normal. Stomach/Bowel: The stomach, duodenum, small bowel and colon grossly normal. Vascular/Lymphatic: The aorta and branch vessels are patent. The major venous structures are patent. Stable borderline enlarged mesenteric and retroperitoneal lymph nodes, likely reactive/inflammatory. Reproductive: The uterus and ovaries are stable. Other: Percutaneous drainage catheter in the upper right pelvis with a persistent rim enhancing abscess  measuring approximately 8.8 x 3.5 cm and previously measuring 9.8 x 5.6 cm. A second drainage catheter in the lower right cul-de-sac area with a small persistent rim enhancing abscess measuring 4.1 x  2.0 cm. This previously measured 5.2 x 4.9 cm. Left-sided rounded pelvic abscess measures 3.6 x 2.9 cm on image 71/3. This previously measured 3.5 x 2.7 cm. Persistent diffuse mesenteric and subcutaneous inflammations/edema. Musculoskeletal: No significant bony findings. IMPRESSION: 1. Persistent moderate-sized pleural effusions with overlying atelectasis. 2. Persistent but slightly smaller undrained perihepatic abscesses. 3. Two right-sided pelvic drainage catheters with persistent but smaller surrounding abscesses. 4. Left-sided undrained pelvic abscess is slightly smaller also. 5. Persistent diffuse mesenteric and subcutaneous inflammation. 6. Stable borderline enlarged mesenteric and retroperitoneal lymph nodes, likely reactive/inflammatory. Electronically Signed   By: Rudie Meyer M.D.   On: 02/14/2022 14:46   MR Lumbar Spine W Wo Contrast  Result Date: 02/13/2022 CLINICAL DATA:  Initial evaluation for back pain, history of MRSA bacteremia. EXAM: MRI LUMBAR SPINE WITHOUT AND WITH CONTRAST TECHNIQUE: Multiplanar and multiecho pulse sequences of the lumbar spine were obtained without and with intravenous contrast. CONTRAST:  7.42mL GADAVIST GADOBUTROL 1 MMOL/ML IV SOLN COMPARISON:  Prior CT from 02/08/2022. FINDINGS: Segmentation: Standard. Lowest well-formed disc space labeled the L5-S1 level. Alignment: Mild levoscoliosis with straightening of the normal lumbar lordosis. No listhesis. Vertebrae: Vertebral body height maintained without acute or chronic fracture. Bone marrow signal intensity diffusely decreased on T1 weighted sequence, nonspecific, but most commonly related to anemia, smoking or obesity. No discrete or worrisome osseous lesions. No evidence for osteomyelitis discitis or septic arthritis within the lumbar spine. Conus medullaris and cauda equina: Conus extends to the L1-2 level. Conus and cauda equina appear normal. Paraspinal and other soft tissues: Paraspinous soft tissues demonstrate no  acute finding. Multi loculated collections/abscesses with surrounding enhancement partially seen within the partially visualized pelvis, better characterized on recent CT. Remainder of the visualized visceral structures otherwise unremarkable. Disc levels: L1-2:  Unremarkable. L2-3: Small left subarticular disc protrusion mildly indents the left ventral thecal sac (series 12, image 16). Borderline mild narrowing of the left lateral recess. Central canal remains widely patent. No foraminal stenosis. L3-4:  Unremarkable. L4-5:  Unremarkable. L5-S1: Left subarticular disc protrusion extends into the left lateral recess, contacting the descending left S1 nerve root (series 12, image 34). Resultant mild left lateral recess stenosis. Central canal remains patent. No significant foraminal stenosis. IMPRESSION: 1. No MRI evidence for acute infection within the lumbar spine. 2. Left subarticular disc protrusion at L5-S1, contacting the descending left S1 nerve root in the left lateral recess. 3. Small left subarticular disc protrusion at L2-3 with borderline mild left lateral recess stenosis. 4. Multiloculated collections/abscesses within the partially visualized pelvis, better characterized on recent CT. Electronically Signed   By: Rise Mu M.D.   On: 02/13/2022 04:04   ECHOCARDIOGRAM COMPLETE  Result Date: 02/12/2022    ECHOCARDIOGRAM REPORT   Patient Name:   MIKEYA TOMASETTI Date of Exam: 02/12/2022 Medical Rec #:  [id number]        Height:       63.0 in Accession #:    [id number]       Weight:       169.5 lb Date of Birth:  August 06, 1991        BSA:          1.802 m Patient Age:    31 years         BP:  113/87 mmHg Patient Gender: F                HR:           98 bpm. Exam Location:  Inpatient Procedure: 2D Echo, Cardiac Doppler and Color Doppler Indications:   Staph aureus infection  History:       Patient has no prior history of Echocardiogram examinations.                Sepsis.  Sonographer:    Ross LudwigArthur Guy RDCS (AE) Referring      TRUNG T VU Phys: IMPRESSIONS  1. Left ventricular ejection fraction, by estimation, is 60 to 65%. The left ventricle has normal function. The left ventricle has no regional wall motion abnormalities. Left ventricular diastolic parameters were normal.  2. Right ventricular systolic function is normal. The right ventricular size is normal.  3. The mitral valve is normal in structure. Trivial mitral valve regurgitation. No evidence of mitral stenosis.  4. The aortic valve is normal in structure. Aortic valve regurgitation is not visualized. No aortic stenosis is present. FINDINGS  Left Ventricle: Left ventricular ejection fraction, by estimation, is 60 to 65%. The left ventricle has normal function. The left ventricle has no regional wall motion abnormalities. The left ventricular internal cavity size was normal in size. There is  no left ventricular hypertrophy. Left ventricular diastolic parameters were normal. Right Ventricle: The right ventricular size is normal. Right vetricular wall thickness was not well visualized. Right ventricular systolic function is normal. Left Atrium: Left atrial size was normal in size. Right Atrium: Right atrial size was normal in size. Pericardium: There is no evidence of pericardial effusion. Mitral Valve: The mitral valve is normal in structure. Trivial mitral valve regurgitation. No evidence of mitral valve stenosis. Tricuspid Valve: The tricuspid valve is normal in structure. Tricuspid valve regurgitation is trivial. Aortic Valve: The aortic valve is normal in structure. Aortic valve regurgitation is not visualized. No aortic stenosis is present. Aortic valve mean gradient measures 4.0 mmHg. Aortic valve peak gradient measures 7.1 mmHg. Aortic valve area, by VTI measures 2.25 cm. Pulmonic Valve: The pulmonic valve was normal in structure. Pulmonic valve regurgitation is not visualized. Aorta: The aortic root and ascending aorta are structurally  normal, with no evidence of dilitation. IAS/Shunts: The atrial septum is grossly normal.  LEFT VENTRICLE PLAX 2D LVIDd:         4.80 cm LVIDs:         3.40 cm LV PW:         0.90 cm LV IVS:        1.10 cm LVOT diam:     1.90 cm LV SV:         49 LV SV Index:   27 LVOT Area:     2.84 cm  RIGHT VENTRICLE             IVC RV Basal diam:  2.30 cm     IVC diam: 1.50 cm RV S prime:     12.40 cm/s TAPSE (M-mode): 2.1 cm LEFT ATRIUM             Index        RIGHT ATRIUM          Index LA diam:        3.80 cm 2.11 cm/m   RA Area:     9.13 cm LA Vol (A2C):   26.4 ml 14.65 ml/m  RA Volume:   15.80 ml  8.77 ml/m LA Vol (A4C):   29.7 ml 16.48 ml/m LA Biplane Vol: 29.3 ml 16.26 ml/m  AORTIC VALVE AV Area (Vmax):    2.24 cm AV Area (Vmean):   2.21 cm AV Area (VTI):     2.25 cm AV Vmax:           133.00 cm/s AV Vmean:          87.900 cm/s AV VTI:            0.218 m AV Peak Grad:      7.1 mmHg AV Mean Grad:      4.0 mmHg LVOT Vmax:         105.00 cm/s LVOT Vmean:        68.600 cm/s LVOT VTI:          0.173 m LVOT/AV VTI ratio: 0.79  AORTA Ao Root diam: 2.90 cm Ao Asc diam:  2.70 cm  SHUNTS Systemic VTI:  0.17 m Systemic Diam: 1.90 cm Kristeen Miss MD Electronically signed by Kristeen Miss MD Signature Date/Time: 02/12/2022/4:39:15 PM    Final    VAS [place] LOWER EXTREMITY VENOUS (DVT)  Result Date: 02/12/2022  Lower Venous DVT Study Patient Name:  SANDRALEE TARKINGTON Ngo  Date of Exam:   02/12/2022 Medical Rec #: [id number]         Accession #:    [id number] Date of Birth: May 15, 1991         Patient Gender: F Patient Age:   54 years Exam Location:  [facility] Procedure:      VAS [place] LOWER EXTREMITY VENOUS (DVT) Referring Phys: JULIE MACHEN --------------------------------------------------------------------------------  Indications: Pain.  Risk Factors: None identified. Limitations: Poor patient cooperation, patient pain tolerance. Comparison Study: No prior studies. Performing Technologist: Chanda Busing RVT  Examination  Guidelines: A complete evaluation includes B-mode imaging, spectral Doppler, color Doppler, and power Doppler as needed of all accessible portions of each vessel. Bilateral testing is considered an integral part of a complete examination. Limited examinations for reoccurring indications may be performed as noted. The reflux portion of the exam is performed with the patient in reverse Trendelenburg.  +---------+---------------+---------+-----------+----------+--------------+ RIGHT    CompressibilityPhasicitySpontaneityPropertiesThrombus Aging +---------+---------------+---------+-----------+----------+--------------+ CFV      Full           Yes      Yes                                 +---------+---------------+---------+-----------+----------+--------------+ SFJ      Full                                                        +---------+---------------+---------+-----------+----------+--------------+ FV Prox  Full                                                        +---------+---------------+---------+-----------+----------+--------------+ FV Mid   Full                                                        +---------+---------------+---------+-----------+----------+--------------+  FV DistalFull                                                        +---------+---------------+---------+-----------+----------+--------------+ PFV      Full                                                        +---------+---------------+---------+-----------+----------+--------------+ POP      Full           Yes      Yes                                 +---------+---------------+---------+-----------+----------+--------------+ PTV      Full                                                        +---------+---------------+---------+-----------+----------+--------------+ PERO     Full                                                         +---------+---------------+---------+-----------+----------+--------------+   +----+---------------+---------+-----------+----------+--------------+ LEFTCompressibilityPhasicitySpontaneityPropertiesThrombus Aging +----+---------------+---------+-----------+----------+--------------+ CFV Full           Yes      Yes                                 +----+---------------+---------+-----------+----------+--------------+    Summary: RIGHT: - There is no evidence of deep vein thrombosis in the lower extremity.  - No cystic structure found in the popliteal fossa.  LEFT: - No evidence of common femoral vein obstruction.  *See table(s) above for measurements and observations. Electronically signed by Sherald Hess MD on 02/12/2022 at 11:23:51 AM.    Final    CT IMAGE GUIDED DRAINAGE BY PERCUTANEOUS CATHETER  Result Date: 02/10/2022 INDICATION: History of tubo-ovarian abscess, post CT-guided placement right lower abdominal/pelvic drainage catheter on 02/05/2022. Postprocedural CT scan performed 02/08/2022 demonstrates an unchanged fluid collection within the right lower pelvis and as such request made for placement of an additional percutaneous drainage catheter for infection source control purposes. Additionally, there is request for CT-guided exchange and/or repositioning of the pre-existing drainage catheter into the more dominant component of the residual pelvic abscess EXAM: 1. CT GUIDED RIGHT TRANS GLUTEAL APPROACH TUBO-OVARIAN ABSCESS DRAINAGE CATHETER PLACEMENT 2. CT-GUIDED EXCHANGE AND REPOSITIONING OF EXISTING RIGHT LOWER ABDOMINAL/PELVIC DRAINAGE CATHETER COMPARISON:  CT abdomen pelvis-02/08/2022; 02/04/2022 CT guided right lower abdominal/pelvic abscess drainage catheter placement-02/05/2022 MEDICATIONS: The patient is currently admitted to the hospital and receiving intravenous antibiotics. The antibiotics were administered within an appropriate time frame prior to the initiation of the  procedure. ANESTHESIA/SEDATION: Moderate (conscious) sedation was employed during this procedure as administered by the Interventional Radiology RN. A total of Versed 4  mg, Dilaudid 1 mg and Fentanyl 150 mcg was administered intravenously. Moderate Sedation Time: 60 minutes. The patient's level of consciousness and vital signs were monitored continuously by radiology nursing throughout the procedure under my direct supervision. CONTRAST:  None COMPLICATIONS: None immediate. PROCEDURE: RADIATION DOSE REDUCTION: This exam was performed according to the departmental dose-optimization program which includes automated exposure control, adjustment of the mA and/or kV according to patient size and/or use of iterative reconstruction technique. Informed written consent was obtained from the patient after a discussion of the risks, benefits and alternatives to treatment. The patient was initially placed prone on the CT gantry and a pre procedural CT was performed re-demonstrating the known abscess/fluid collection within the right hemipelvis with dominant component measuring approximately 5.5 x 5.4 cm (image 11, series 2). The procedure was planned. A timeout was performed prior to the initiation of the procedure. The skin overlying the right buttocks was prepped and draped in the usual sterile fashion. The overlying soft tissues were anesthetized with 1% lidocaine with epinephrine. Appropriate trajectory was planned with the use of a 22 gauge spinal needle. An 18 gauge trocar needle was advanced into the abscess/fluid collection and a short Amplatz super stiff wire was coiled within the collection. Appropriate positioning was confirmed with a limited CT scan. The tract was serially dilated allowing placement of a 10 [place] all-purpose drainage catheter. Appropriate positioning was confirmed with a limited postprocedural CT scan. Approximately 65 ml of purulent appearing serous fluid was aspirated. The tube was connected to a  drainage bag and secured in place within interrupted suture and a Stat Lock device. A dressing was applied. The patient was then repositioned supine on the CT gantry and noncontrast images were obtained of the lower pelvis and pre-existing right anterior approach lower abdominal/pelvic drainage catheter. Attempts were made to retract the existing drainage catheter into the dominant component of the residual collection however this proved uncomfortable for the patient. As such, the external portion of the drainage catheter was prepped and draped in the usual sterile fashion. The external portion of the drainage catheter was cut and cannulated with a short Amplatz wire. Existing 12 French drainage catheter was then exchanged for a new 12 French percutaneous catheter which under intermittent CT guidance was repositioned into the dominant residual component of the right lower quadrant collection (series 11). The drainage catheter was connected to a gravity bag and secured in place within interrupted suture and a Stat Lock device. Patient tolerated the above procedures well (though did require a generous amount of conscious sedation medications) without immediate postprocedural complication. IMPRESSION: 1. Successful CT guided placement of a 10 [place] all purpose drain catheter into the undrained pelvic abscess via right trans gluteal approach with aspiration of 65 mL of purulent fluid. Samples were sent to the laboratory as requested by the ordering clinical team. 2. Successful CT guided exchange and repositioning of pre-existing 12 French right anterior abdominal/pelvic drainage catheter with end now coiled and locked within the residual anteriorly located abscess cavity. Electronically Signed   By: Simonne Come M.D.   On: 02/10/2022 16:33   CT ABSCESS CATH EXCHANGE  Result Date: 02/10/2022 INDICATION: History of tubo-ovarian abscess, post CT-guided placement right lower abdominal/pelvic drainage catheter on  02/05/2022. Postprocedural CT scan performed 02/08/2022 demonstrates an unchanged fluid collection within the right lower pelvis and as such request made for placement of an additional percutaneous drainage catheter for infection source control purposes. Additionally, there is request for CT-guided exchange and/or repositioning of  the pre-existing drainage catheter into the more dominant component of the residual pelvic abscess EXAM: 1. CT GUIDED RIGHT TRANS GLUTEAL APPROACH TUBO-OVARIAN ABSCESS DRAINAGE CATHETER PLACEMENT 2. CT-GUIDED EXCHANGE AND REPOSITIONING OF EXISTING RIGHT LOWER ABDOMINAL/PELVIC DRAINAGE CATHETER COMPARISON:  CT abdomen pelvis-02/08/2022; 02/04/2022 CT guided right lower abdominal/pelvic abscess drainage catheter placement-02/05/2022 MEDICATIONS: The patient is currently admitted to the hospital and receiving intravenous antibiotics. The antibiotics were administered within an appropriate time frame prior to the initiation of the procedure. ANESTHESIA/SEDATION: Moderate (conscious) sedation was employed during this procedure as administered by the Interventional Radiology RN. A total of Versed 4 mg, Dilaudid 1 mg and Fentanyl 150 mcg was administered intravenously. Moderate Sedation Time: 60 minutes. The patient's level of consciousness and vital signs were monitored continuously by radiology nursing throughout the procedure under my direct supervision. CONTRAST:  None COMPLICATIONS: None immediate. PROCEDURE: RADIATION DOSE REDUCTION: This exam was performed according to the departmental dose-optimization program which includes automated exposure control, adjustment of the mA and/or kV according to patient size and/or use of iterative reconstruction technique. Informed written consent was obtained from the patient after a discussion of the risks, benefits and alternatives to treatment. The patient was initially placed prone on the CT gantry and a pre procedural CT was performed  re-demonstrating the known abscess/fluid collection within the right hemipelvis with dominant component measuring approximately 5.5 x 5.4 cm (image 11, series 2). The procedure was planned. A timeout was performed prior to the initiation of the procedure. The skin overlying the right buttocks was prepped and draped in the usual sterile fashion. The overlying soft tissues were anesthetized with 1% lidocaine with epinephrine. Appropriate trajectory was planned with the use of a 22 gauge spinal needle. An 18 gauge trocar needle was advanced into the abscess/fluid collection and a short Amplatz super stiff wire was coiled within the collection. Appropriate positioning was confirmed with a limited CT scan. The tract was serially dilated allowing placement of a 10 [place] all-purpose drainage catheter. Appropriate positioning was confirmed with a limited postprocedural CT scan. Approximately 65 ml of purulent appearing serous fluid was aspirated. The tube was connected to a drainage bag and secured in place within interrupted suture and a Stat Lock device. A dressing was applied. The patient was then repositioned supine on the CT gantry and noncontrast images were obtained of the lower pelvis and pre-existing right anterior approach lower abdominal/pelvic drainage catheter. Attempts were made to retract the existing drainage catheter into the dominant component of the residual collection however this proved uncomfortable for the patient. As such, the external portion of the drainage catheter was prepped and draped in the usual sterile fashion. The external portion of the drainage catheter was cut and cannulated with a short Amplatz wire. Existing 12 French drainage catheter was then exchanged for a new 12 French percutaneous catheter which under intermittent CT guidance was repositioned into the dominant residual component of the right lower quadrant collection (series 11). The drainage catheter was connected to a gravity  bag and secured in place within interrupted suture and a Stat Lock device. Patient tolerated the above procedures well (though did require a generous amount of conscious sedation medications) without immediate postprocedural complication. IMPRESSION: 1. Successful CT guided placement of a 10 [place] all purpose drain catheter into the undrained pelvic abscess via right trans gluteal approach with aspiration of 65 mL of purulent fluid. Samples were sent to the laboratory as requested by the ordering clinical team. 2. Successful CT guided exchange and repositioning  of pre-existing 12 French right anterior abdominal/pelvic drainage catheter with end now coiled and locked within the residual anteriorly located abscess cavity. Electronically Signed   By: Simonne Come M.D.   On: 02/10/2022 16:33   CT ABDOMEN PELVIS W CONTRAST  Result Date: 02/08/2022 CLINICAL DATA:  Sepsis EXAM: CT ABDOMEN AND PELVIS WITH CONTRAST TECHNIQUE: Multidetector CT imaging of the abdomen and pelvis was performed using the standard protocol following bolus administration of intravenous contrast. RADIATION DOSE REDUCTION: This exam was performed according to the departmental dose-optimization program which includes automated exposure control, adjustment of the mA and/or kV according to patient size and/or use of iterative reconstruction technique. CONTRAST:  OMNIPAQUE IOHEXOL 300 MG/ML  SOLN COMPARISON:  Previous studies including the CT done on 05-Feb-2022 FINDINGS: Lower chest: Small bilateral pleural effusions are seen more so on the right side. There are linear patchy infiltrates in the lingula and both lower lobes suggesting atelectasis/pneumonia. Hepatobiliary: No focal abnormality is seen in the liver. There is no dilation of bile ducts. Gallbladder is unremarkable. Small ascites is noted in the perihepatic region. Pancreas: No focal abnormality is seen. Spleen: Unremarkable. Adrenals/Urinary Tract: Adrenals are not enlarged. There  is no hydronephrosis. There are no renal or ureteral stones. Urinary bladder is unremarkable. There is 3 mm low-density in the midportion of left kidney which is too small to be characterized. Stomach/Bowel: Stomach is not distended. Small bowel loops are not dilated. Appendix is not seen. There is no significant wall thickening in the colon. There is interval placement of percutaneous drainage catheter with its tip in the right lower abdomen. There is interval decrease in size of the complex fluid collection in the right lower quadrant. There is 4 cm loculated fluid collection slightly to the left of midline at the level of pelvic inlet. There is 7.2 x 5.3 x 4.9 cm loculated fluid collection with minimally enhancing margin in the cul-de-sac in the pelvis. There are no pockets of air within these loculated fluid collections. There is stranding in the fat planes in the lower abdomen and pelvis. Vascular/Lymphatic: Vascular structures are unremarkable. Reproductive: Uterus is to the right of midline. Other: There is no pneumoperitoneum. Musculoskeletal: No acute findings are seen. IMPRESSION: Interval placement of percutaneous drainage catheter within air-fluid collection in the right lower abdomen with interval decrease in size. There is 7.2 cm loculated fluid collection in the cul-de-sac in the pelvis with slightly enhancing margins. There is another 4 cm loculated fluid collection in the left side of the pelvis. These may suggest loculated serous fluid collections or abscesses. Small ascites is noted in the perihepatic region. There is no evidence of intestinal obstruction or pneumoperitoneum. There is no hydronephrosis. Small bilateral pleural effusions. There are linear patchy infiltrates in both lower lung fields suggesting atelectasis/pneumonia. Electronically Signed   By: Ernie Avena M.D.   On: 02/08/2022 09:41   CT IMAGE GUIDED DRAINAGE BY PERCUTANEOUS CATHETER  Result Date: 02/05/2022 INDICATION:  PID with large abdominopelvic fluid collection. EXAM: CT GUIDED DRAINAGE CATHETER PLACEMENT INTO ABDOMINOPELVIC FLUID COLLECTION RADIATION DOSE REDUCTION: This exam was performed according to the departmental dose-optimization program which includes automated exposure control, adjustment of the mA and/or kV according to patient size and/or use of iterative reconstruction technique. COMPARISON:  CT AP, February 05, 2022 MEDICATIONS: The patient is currently admitted to the hospital and receiving intravenous antibiotics. The antibiotics were administered within an appropriate time frame prior to the initiation of the procedure. ANESTHESIA/SEDATION: Moderate (conscious) sedation was employed during this procedure.  A total of Versed 3 mg and Fentanyl 75 mcg was administered intravenously. Additionally, 1 mg Dilaudid was administered IV. Moderate Sedation Time: 19 minutes. The patient's level of consciousness and vital signs were monitored continuously by radiology nursing throughout the procedure under my direct supervision. CONTRAST:  None COMPLICATIONS: None immediate. PROCEDURE: Informed written consent was obtained from the patient and/or patient's representative after a discussion of the risks, benefits and alternatives to treatment. The patient was placed supine on the CT gantry and a pre procedural CT was performed re-demonstrating the known abscess/fluid collection within the lower abdomen/pelvis. The procedure was planned. A timeout was performed prior to the initiation of the procedure. The RIGHT lower quadrant was prepped and draped in the usual sterile fashion. The overlying soft tissues were anesthetized with 1% lidocaine with epinephrine. Appropriate trajectory was planned with the use of a 22 gauge spinal needle. An 18 gauge trocar needle was advanced into the abscess/fluid collection and a short Amplatz super stiff wire was coiled within the collection. Appropriate positioning was confirmed with a limited CT  scan. The tract was serially dilated allowing placement of a 12 [place] all-purpose drainage catheter. Appropriate positioning was confirmed with a limited postprocedural CT scan. 5 mL ml of serous fluid was aspirated and submitted for analysis. The tube was connected to a drainage bag and sutured in place. A dressing was placed. The patient tolerated the procedure well without immediate post procedural complication. IMPRESSION: Successful CT guided placement of a 12 Fr drainage catheter into the abdominopelvic collection with aspiration of serous fluid, as above. Samples were sent to the laboratory as requested by the ordering clinical team. Roanna Banning, MD [facility] Radiology Electronically Signed   By: Roanna Banning M.D.   On: 02/05/2022 18:08   CT ABDOMEN PELVIS WO CONTRAST  Result Date: 02/04/2022 CLINICAL DATA:  Abdominal pain, acute, nonlocalized septic shock, abnormal uterine US, concern for PID EXAM: CT ABDOMEN AND PELVIS WITHOUT CONTRAST TECHNIQUE: Multidetector CT imaging of the abdomen and pelvis was performed following the standard protocol without IV contrast. RADIATION DOSE REDUCTION: This exam was performed according to the departmental dose-optimization program which includes automated exposure control, adjustment of the mA and/or kV according to patient size and/or use of iterative reconstruction technique. COMPARISON:  Pelvic ultrasound today FINDINGS: Lower chest: Pleural thickening noted peripherally in the right lower hemithorax, possibly representing trace loculated pleural effusion. No confluent airspace opacities. Hepatobiliary: No focal hepatic abnormality. Gallbladder unremarkable. Pancreas: No focal abnormality or ductal dilatation. Spleen: No focal abnormality.  Normal size. Adrenals/Urinary Tract: No adrenal abnormality. No focal renal abnormality. No stones or hydronephrosis. Urinary bladder is unremarkable. Stomach/Bowel: Wall  thickening noted in the cecum which is immediately adjacent to the large pelvic fluid collection described below. This could reflect colitis or be secondarily inflamed. Stomach and small bowel grossly unremarkable. Vascular/Lymphatic: No evidence of aneurysm or adenopathy. Reproductive: Large complex cystic area or fluid collection seen in the pelvis extending from the cul-de-sac superiorly into the right lower quadrant adjacent to the cecum. This measures approximately 12 x 8 cm on image 64 of series 3. Internal areas of increased density. This could reflect blood or abscess/PID. Uterus grossly unremarkable on this noncontrast study. Ovaries not definitively visualized. Other: There is free fluid adjacent to the liver and spleen. Musculoskeletal: No acute bony abnormality. IMPRESSION: Complex process in the pelvis including what appears to be a large complex fluid collection measuring 12 x 8 cm extending from the cul-de-sac  superiorly to the cecum which could reflect blood products or abscess. This also could conceivably be related to and ovary. Neither ovary definitively identified. Wall thickening in the cecum could reflect colitis or secondary inflammation related to the pelvic process. Perihepatic and perisplenic ascites. Possible trace loculated right pleural effusion peripherally in the right lower hemithorax. Electronically Signed   By: Charlett Nose M.D.   On: 02/04/2022 22:29   US PELVIC COMPLETE WITH TRANSVAGINAL  Addendum Date: 02/04/2022   ADDENDUM REPORT: 02/04/2022 13:44 ADDENDUM: In light of negative quantitative beta HCG and no documented pregnancy the possibility of infectious causes for the above findings may be more likely but findings are essentially quite nonspecific at this time. Even noncontrast CT could prove beneficial to allow for assessment of pelvic fluid to determine whether this represents frank hemoperitoneum and to assess for any associated inflammatory changes that may be present  not well assessed on ultrasound. These results were called by telephone at the time of interpretation on 02/04/2022 at 1:44 pm to provider [facility] , who verbally acknowledged these results. Electronically Signed   By: Donzetta Kohut M.D.   On: 02/04/2022 13:44   Result Date: 02/04/2022 CLINICAL DATA:  A 31 year old female presents with pelvic pain and bleeding. Patient reports positive pregnancy but urine pregnancy currently negative by report and beta HCG is currently pending. EXAM: TRANSABDOMINAL AND TRANSVAGINAL ULTRASOUND OF PELVIS TECHNIQUE: Both transabdominal and transvaginal ultrasound examinations of the pelvis were performed. Transabdominal technique was performed for global imaging of the pelvis including uterus, ovaries, adnexal regions, and pelvic cul-de-sac. It was necessary to proceed with endovaginal exam following the transabdominal exam to visualize the endometrium and adnexa. COMPARISON:  No recent imaging is available for comparison. FINDINGS: Uterus Measurements: 12.6 x 8.0 x 8.5 (volume = 450 cc) cm. = volume: 450 mL. Heterogeneous uterus with thickened endometrium. Cystic area in the endometrial canal that measures 4.5 x 2.6 x 4.4 cm. This cystic characteristics and central increased echogenicity. Central increased echogenicity the without discrete finding that would suggest embryo with very limited assessment due to patient discomfort and clinical condition. Endometrium Thickness: 3.3 cm. Thickened with cystic area in the fundus of the uterus. Right ovary Measurements: 3.2 x 2.7 x 2.1 (volume = 9.5 cc) cm = volume: 9.5 mL. Normal appearance/no adnexal mass. Left ovary Not visible, in the area of the LEFT adnexa is heterogeneous material that has the appearance of blood products but conforms to the cul-de-sac and LEFT adnexa. Other findings Moderate heterogeneous fluid in the LEFT adnexa primarily but also tracking along the RIGHT uterus. IMPRESSION: Unusual cystic area in the  endometrial canal. If this patient has any history of recent positive pregnancy test this could represent a failed gestation with retained products of conception. Pseudo gestational sac with areas of internal hemorrhage in the setting of ruptured ectopic pregnancy is also considered. Heterogeneous appearance of material in the cul-de-sac could also potentially represent sequela of pyogenic infection though this is lower on the differential at this time based on appearance. If the patient could tolerate further evaluation with cross-sectional imaging CT could potentially be helpful. Ultimately gyn consultation is suggested. Critical Value/emergent results were called by telephone at the time of interpretation on 02/04/2022 at 11:01 am to provider [facility] , who verbally acknowledged these results. Electronically Signed: By: Donzetta Kohut M.D. On: 02/04/2022 11:01   DG Chest Port 1 View  Result Date: 02/04/2022 CLINICAL DATA:  31 year old female with history of lower abdominal pain and  vaginal bleeding 3 days ago. Fever. Low blood pressure. EXAM: PORTABLE CHEST 1 VIEW COMPARISON:  Chest x-ray 08/09/2021. FINDINGS: Lung volumes are slightly low. No consolidative airspace disease. No pleural effusions. No pneumothorax. No evidence of pulmonary edema. Heart size is normal. The patient is rotated to the right on today's exam, resulting in distortion of the mediastinal contours and reduced diagnostic sensitivity and specificity for mediastinal pathology. IMPRESSION: 1. No radiographic evidence of acute cardiopulmonary disease. Electronically Signed   By: Trudie Reed M.D.   On: 02/04/2022 10:55    Scheduled Meds:  Chlorhexidine Gluconate Cloth  6 each Topical Daily   doxycycline  100 mg Oral Q12H   DULoxetine  60 mg Oral Daily   enoxaparin (LOVENOX) injection  40 mg Subcutaneous Q24H   feeding supplement  1 Container Oral TID BM   feeding supplement (PROSource TF)  45 mL Per Tube BID   fentaNYL  1  patch Transdermal Q72H   gabapentin  300 mg Oral TID    HYDROmorphone (DILAUDID) injection  0.5 mg Intravenous Once   lidocaine  1 patch Transdermal Daily   methocarbamol  1,000 mg Oral Q6H   metroNIDAZOLE  500 mg Oral Q12H   multivitamin with minerals  1 tablet Oral Daily   nicotine  21 mg Transdermal Daily   pantoprazole  40 mg Oral Daily   polyethylene glycol  17 g Oral BID   senna-docusate  1 tablet Oral QHS   sodium chloride flush  10-40 mL Intracatheter Q12H   traZODone  50 mg Oral QHS   Continuous Infusions:  cefTRIAXone (ROCEPHIN)  IV 2 g (02/26/22 2113)   feeding supplement (OSMOLITE 1.5 CAL) Stopped (02/25/22 1505)   PRN Meds:bisacodyl, [DISCONTINUED] diphenhydrAMINE **OR** diphenhydrAMINE, hydrOXYzine, lip balm, menthol-cetylpyridinium, ondansetron **OR** ondansetron (ZOFRAN) IV, oxyCODONE, phenol, polyvinyl alcohol, prochlorperazine, simethicone, sodium chloride flush    LOS: 18 days   Reva Bores, MD 02/27/2022 9:09 AM

## 2022-02-27 NOTE — PMR Pre-admission (Signed)
PMR Admission Coordinator Pre-Admission Assessment  Patient: Paula Lucas is an 31 y.o., female MRN: [id number] DOB: 1991-05-03 Height: 5\' 3"  (160 cm) Weight: 76.8 kg  Insurance Information HMO:     PPO:      PCP:      IPA:      80/20:      OTHER:  PRIMARY: Medicaid of [place]       Policy#: [id number]      Subscriber: Pt  CM Name:       Phone#:      Fax#:  Pre-Cert#:       Employer:  Benefits:  Phone #:      Name:  Eff. Date: Checked 02/27/22. Pt. Eligible. Code [id number]     Deduct:       Out of Pocket Max:       Life Max:  CIR:       SNF:  Outpatient:      Co-Pay:  Home Health:       Co-Pay:  DME:      Co-Pay:  Providers:  SECONDARY:       Policy#:      Phone#:   Development worker, community:       Phone#:   [employer] Engineer, [employer]" for patients in Inpatient Rehabilitation Facilities with attached "Privacy Act [place] Records" was provided and verbally reviewed with: Patient  Emergency Contact Information Contact Information     Name Relation Home Work Metcalf Mother   [phone]   miller,latosha Sister   [phone]       Current Medical History  Patient Admitting Diagnosis: Pelvic Abscesses, Debility  History of Present Illness: Paula Lucas is a 31 y.o. female with PMH including chlamydia, gonorrhea, tubo-ovarian abscess, asthma, and HTN. She was initially admitted 02/04/22-02/07/22 after presenting on 6/16 for drain placement secondary to a complex fluid collection in the pelvis.She left AMA on 6/18, then returned to the ED 02/08/22 Follow-up CT on 6/19 revealed a 7.2cm loculated fluid collection. arrival to the ED, patient was tachycardic up to 117 with a blood pressure of 119/69.  She was saturating at 95% on room air.  CBC demonstrates leukocytosis at 16.9, hemoglobin of 10.1 and platelets of 518.  CMP remarkable for potassium of 3.3.  CT abdomen/pelvis was obtained that demonstrated percutaneous drain within fluid collection in the right  lower abdomen with interval decrease in size.  There is a new 7.2 cm loculated fluid collection in the cul-de-sac in addition to another 4 cm loculated fluid collection in the left side of the pelvis.  There is mild ascites in the perihepatic region.  In addition small bilateral pleural effusions and findings consistent with atelectasis versus pneumonia.   Patient was started on Cefepime, Metronidazole and Doxycycline. IR and OB/GYN were consulted. On 6/27, pt underwent ex lap, abdominal washout with drainage of abscesses, repair SB serosa X 3,  Coretrak placed 6/30 due to poor intake. ID was consulted. cultures taken in OR (NGTD), currently rocephin/ flagyl/doxycycline/ vancomycin. CIR was consulted to assist return to PLOF.   WOrk up for tachycardia, EKG NSR, ECHO normal   Patient[facility] medical record [facility] has been reviewed by the rehabilitation admission coordinator and physician.  Past Medical History  Past Medical History:  Diagnosis Date   Asthma    BMI 31.0-31.9,adult    Chlamydia    Gonorrhea    TOA (tubo-ovarian abscess)     Has the patient had major surgery  during 100 days prior to admission? Yes  Family History   family history includes Diabetes in her father, mother, and sister; Hypertension in her father, mother, and sister.  Current Medications  Current Facility-Administered Medications:    bisacodyl (DULCOLAX) suppository 10 mg, 10 mg, Rectal, Daily PRN, Donnamae Jude, MD   cefTRIAXone (ROCEPHIN) 2 g in sodium chloride 0.9 % 100 mL IVPB, 2 g, Intravenous, Q24H, Vu, Trung T, MD, Last Rate: 200 mL/hr at 02/26/22 2113, 2 g at 02/26/22 2113   Chlorhexidine Gluconate Cloth 2 % PADS 6 each, 6 each, Topical, Daily, Lottie Mussel, MD, 6 each at 02/27/22 0600   cyclobenzaprine (FLEXERIL) tablet 5 mg, 5 mg, Oral, TID, Virl Axe, MD   [DISCONTINUED] diphenhydrAMINE (BENADRYL) injection 12.5 mg, 12.5 mg, Intravenous, Q6H PRN, 12.5 mg at 02/20/22 2349  **OR** diphenhydrAMINE (BENADRYL) 12.5 MG/5ML elixir 12.5 mg, 12.5 mg, Oral, Q6H PRN, Donnamae Jude, MD   doxycycline (VIBRA-TABS) tablet 100 mg, 100 mg, Oral, Q12H, Vu, Trung T, MD, 100 mg at 02/26/22 2121   DULoxetine (CYMBALTA) DR capsule 60 mg, 60 mg, Oral, Daily, Virl Axe, MD   enoxaparin (LOVENOX) injection 40 mg, 40 mg, Subcutaneous, Q24H, Donnamae Jude, MD, 40 mg at 02/26/22 1212   feeding supplement (BOOST / RESOURCE BREEZE) liquid 1 Container, 1 Container, Oral, TID BM, Charise Killian, MD, 1 Container at 02/25/22 1511   feeding supplement (OSMOLITE 1.5 CAL) liquid 1,000 mL, 1,000 mL, Per Tube, Continuous, Lottie Mussel, MD, Stopped at 02/25/22 1505   feeding supplement (PROSource TF) liquid 45 mL, 45 mL, Per Tube, BID, Lottie Mussel, MD, 45 mL at 02/25/22 1023   fentaNYL (DURAGESIC) 25 MCG/HR 1 patch, 1 patch, Transdermal, Q72H, Virl Axe, MD, 1 patch at 02/26/22 1214   gabapentin (NEURONTIN) capsule 300 mg, 300 mg, Oral, TID, France Ravens, MD, 300 mg at 02/26/22 2120   hydrOXYzine (ATARAX) tablet 25 mg, 25 mg, Oral, TID PRN, Donnamae Jude, MD, 25 mg at 02/24/22 0516   lidocaine (LIDODERM) 5 % 1 patch, 1 patch, Transdermal, Daily, Delene Ruffini, MD, 1 patch at 02/26/22 1211   lip balm (CARMEX) ointment, , Topical, PRN, Jose Persia, MD, 75 Application at 44/03/47 2209   menthol-cetylpyridinium (CEPACOL) lozenge 3 mg, 1 lozenge, Oral, Q2H PRN, Donnamae Jude, MD, 3 mg at 02/21/22 0853   metroNIDAZOLE (FLAGYL) tablet 500 mg, 500 mg, Oral, Q12H, Charise Killian, MD, 500 mg at 02/26/22 2119   multivitamin with minerals tablet 1 tablet, 1 tablet, Oral, Daily, Charise Killian, MD, 1 tablet at 02/25/22 1511   nicotine (NICODERM CQ - dosed in mg/24 hours) patch 21 mg, 21 mg, Transdermal, Daily, Donnamae Jude, MD, 21 mg at 02/26/22 0918   ondansetron (ZOFRAN) tablet 4 mg, 4 mg, Oral, Q6H PRN, 4 mg at 02/13/22 0032 **OR** ondansetron (ZOFRAN) injection 4 mg, 4 mg, Intravenous, Q6H PRN, Donnamae Jude, MD, 4 mg at 02/27/22 4259   oxyCODONE (Oxy IR/ROXICODONE) immediate release tablet 5 mg, 5 mg, Oral, Q8H PRN, Virl Axe, MD, 5 mg at 02/27/22 0110   pantoprazole (PROTONIX) EC tablet 40 mg, 40 mg, Oral, Daily, Charise Killian, MD, 40 mg at 02/25/22 1022   phenol (CHLORASEPTIC) mouth spray 1 spray, 1 spray, Mouth/Throat, PRN, [place], Elizabeth S, PA-C   polyethylene glycol (MIRALAX / GLYCOLAX) packet 17 g, 17 g, Oral, BID, Charise Killian, MD   polyvinyl alcohol (LIQUIFILM TEARS) 1.4 % ophthalmic solution 1 drop, 1 drop, Both Eyes, PRN, Donnamae Jude, MD  prochlorperazine (COMPAZINE) injection 10 mg, 10 mg, Intravenous, Q6H PRN, Meuth, Brooke A, PA-C, 10 mg at 02/27/22 0318   senna-docusate (Senokot-S) tablet 1 tablet, 1 tablet, Oral, QHS, Virl Axe, MD, 1 tablet at 02/23/22 2231   simethicone (MYLICON) chewable tablet 80 mg, 80 mg, Oral, Q6H PRN, Charise Killian, MD   sodium chloride flush (NS) 0.9 % injection 10-40 mL, 10-40 mL, Intracatheter, Q12H, Lottie Mussel, MD, 10 mL at 02/27/22 0515   sodium chloride flush (NS) 0.9 % injection 10-40 mL, 10-40 mL, Intracatheter, PRN, Lottie Mussel, MD, 10 mL at 02/24/22 0531   traZODone (DESYREL) tablet 50 mg, 50 mg, Oral, QHS, Charise Killian, MD, 50 mg at 02/26/22 2120  Patients Current Diet:  Diet Order             Diet regular Room service appropriate? Yes; Fluid consistency: Thin  Diet effective now                   Precautions / Restrictions Precautions Precautions: Fall Precaution Comments: Contact;  abd JP x2 drains, wound vac; watch HR/O2 Restrictions Weight Bearing Restrictions: No   Has the patient had 2 or more falls or a fall with injury in the past year? No  Prior Activity Level Community (5-7x/wk): pt active in the community PTA  Prior Functional Level Self Care: Did the patient need help bathing, dressing, using the toilet or eating? Independent  Indoor Mobility: Did the patient need assistance with walking from room  to room (with or without device)? Independent  Stairs: Did the patient need assistance with internal or external stairs (with or without device)? Independent  Functional Cognition: Did the patient need help planning regular tasks such as shopping or remembering to take medications? Independent  Patient Information Are you of Hispanic, Latino/a,or Spanish origin?: A. No, not of Hispanic, Latino/a, or Spanish origin What is your race?: B. Black or African American Do you need or want an interpreter to communicate with a doctor or health care staff?: 0. No  Patient's Response To:  Health Literacy and Transportation Is the patient able to respond to health literacy and transportation needs?: Yes Health Literacy - How often do you need to have someone help you when you read instructions, pamphlets, or other written material from your doctor or pharmacy?: Never In the past 12 months, has lack of transportation kept you from medical appointments or from getting medications?: Yes In the past 12 months, has lack of transportation kept you from meetings, work, or from getting things needed for daily living?: Yes  Home Assistive Devices / [place] Devices/Equipment: None  Prior Device Use: Indicate devices/aids used by the patient prior to current illness, exacerbation or injury? None of the above  Current Functional Level Cognition  Overall Cognitive Status: Impaired/Different from baseline Orientation Level: Oriented X4 Safety/Judgement: Decreased awareness of safety General Comments: decreased awareness of lines, impulsive    Extremity Assessment (includes Sensation/Coordination)  Upper Extremity Assessment: Generalized weakness  Lower Extremity Assessment: Defer to PT evaluation    ADLs  Overall ADL's : Needs assistance/impaired Eating/Feeding: Set up, Sitting Grooming: Wash/dry hands, Sitting, Supervision/safety Upper Body Bathing: Moderate assistance, Cueing for  safety, Cueing for sequencing, Bed level Lower Body Bathing: Total assistance, Bed level Upper Body Dressing : Minimal assistance, Bed level Lower Body Dressing: Maximal assistance, Sit to/from stand Toilet Transfer: Minimal assistance, Stand-pivot, BSC/3in1 Toileting- Clothing Manipulation and Hygiene: Maximal assistance, Sit to/from stand Toileting - Clothing Manipulation Details (indicate cue type and reason):  posterior pericare Functional mobility during ADLs: Min guard General ADL Comments: Pt very limited in any mobility at this time and did not make it to OOB actvity    Mobility  Overal bed mobility: Needs Assistance Bed Mobility: Rolling, Sidelying to Sit, Sit to Sidelying Rolling: Min assist Sidelying to sit: Min assist Supine to sit: Min assist Sit to supine: Supervision Sit to sidelying: Min assist General bed mobility comments: cues for log roll technique, assist to raise trunk and for LEs back into bed, increased time    Transfers  Overall transfer level: Needs assistance Equipment used: 1 person hand held assist Transfers: Sit to/from Stand, Bed to chair/wheelchair/BSC Sit to Stand: Min assist Bed to/from chair/wheelchair/BSC transfer type:: Stand pivot Stand pivot transfers: Min assist Step pivot transfers: Min assist General transfer comment: assist to rise and steady    Ambulation / Gait / Stairs / Emergency planning/management officer  Ambulation/Gait Ambulation/Gait assistance: Counsellor (Feet): 20 Feet (36ft to bathroom toilet, seated break ~10 mins, then 73ft in room, then ~65ft toward [facility]) Assistive device: Rolling walker (2 wheels) Gait Pattern/deviations: Step-through pattern, Trunk flexed General Gait Details: pt refusing today, c/o nausea Gait velocity: Decreased Pre-gait activities: standing hip flexion x10 reps at RW, min guard for safety    Posture / Balance Dynamic Sitting Balance Sitting balance - Comments: Supervision for static  sitting Balance Overall balance assessment: Needs assistance Sitting-balance support: No upper extremity supported, Feet supported Sitting balance-Leahy Scale: Fair Sitting balance - Comments: Supervision for static sitting Standing balance support: Single extremity supported Standing balance-Leahy Scale: Poor Standing balance comment: reliant on at least one hand support    Special needs/care consideration Wound Vac abdominal  and Skin surgical incision2   Previous Home Environment (from acute therapy documentation) Living Arrangements: Alone  Lives With: Alone Available Help at Discharge: Available 24 hours/day Type of Home: Other(Comment) (Pt. living alone in hotel PTA) Home Access: Level entry Bathroom Shower/Tub: Chiropodist: Standard Bathroom Accessibility: No Home Care Services: No Additional Comments: Pt reports she plans to go to her mothers home, but per CSW, mother is not planning for pt to return home with her.  Discharge Living Setting Plans for Discharge Living Setting: Apartment Type of Home at Discharge: Apartment Discharge Home Layout: Two level Alternate Level Stairs-Rails: Can reach both, Left, Right Alternate Level Stairs-Number of Steps: 14 Discharge Home Access: Level entry Entrance Stairs-Rails: None Discharge Bathroom Shower/Tub: Tub/shower unit Discharge Bathroom Toilet: Standard Discharge Bathroom Accessibility: Yes How Accessible: Accessible via walker Does the patient have any problems obtaining your medications?: No  Social/Family/Support Systems Patient Roles: Parent Contact Information: [phone] Anticipated Caregiver: Blanchie Serve Ability/Limitations of Caregiver: Supervision only Caregiver Availability: 24/7 Discharge Plan Discussed with Primary Caregiver: Yes Is Caregiver In Agreement with Plan?: No Does Caregiver/Family have Issues with Lodging/Transportation while Pt is in Rehab?: No  Goals Patient/Family Goal  for Rehab: PT/OT Mod I Expected length of stay: 7-10 days Pt/Family Agrees to Admission and willing to participate: Yes Program Orientation Provided & Reviewed with Pt/Caregiver Including Roles  & Responsibilities: Yes  Decrease burden of Care through IP rehab admission: n/a  Possible need for SNF placement upon discharge: not anticipated  Patient Condition: I have reviewed medical records from [facility], spoken with CM, and patient. I met with patient at the bedside for inpatient rehabilitation assessment.  Patient will benefit from ongoing PT and OT, can actively participate in 3 hours of therapy a day 5 days of  the week, and can make measurable gains during the admission.  Patient will also benefit from the coordinated team approach during an Inpatient Acute Rehabilitation admission.  The patient will receive intensive therapy as well as Rehabilitation physician, nursing, social worker, and care management interventions.  Due to safety, skin/wound care, disease management, medication administration, pain management, and patient education the patient requires 24 hour a day rehabilitation nursing.  The patient is currently min A with mobility and basic ADLs.  Discharge setting and therapy post discharge at home with home health is anticipated.  Patient has agreed to participate in the Acute Inpatient Rehabilitation Program and will admit today.  Preadmission Screen Completed By:  Genella Mech, 02/27/2022 12:21 PM ______________________________________________________________________   Discussed status with Dr. Letta Pate  on 02/28/22 at 42 and received approval for admission today.  Admission Coordinator:  Genella Mech, CCC-SLP, time 1100/Date 02/28/22   Assessment/Plan: Diagnosis:  Debility after sepsis Does the need for close, 24 hr/day Medical supervision in concert with the patient's rehab needs make it unreasonable for this patient to be served in a less intensive setting?  Yes Co-Morbidities requiring supervision/potential complications: wound care from pelvic infection, management of pelvic drains  Due to bladder management, bowel management, safety, skin/wound care, disease management, medication administration, pain management, and patient education, does the patient require 24 hr/day rehab nursing? Yes Does the patient require coordinated care of a physician, rehab nurse, PT, OT, and SLP to address physical and functional deficits in the context of the above medical diagnosis(es)? Yes Addressing deficits in the following areas: balance, endurance, locomotion, strength, transferring, bowel/bladder control, bathing, dressing, feeding, grooming, toileting, and psychosocial support Can the patient actively participate in an intensive therapy program of at least 3 hrs of therapy 5 days a week? Yes The potential for patient to make measurable gains while on inpatient rehab is good Anticipated functional outcomes upon discharge from inpatient rehab: modified independent PT, modified independent OT, n/a SLP Estimated rehab length of stay to reach the above functional goals is: 7-10d Anticipated discharge destination: Home 10. Overall Rehab/Functional Prognosis: excellent   MD Signature: Charlett Blake M.D. [facility] Fellow Am Acad of Phys Med and Rehab Diplomate Am Board of Electrodiagnostic Med Fellow Am Board of Interventional Pain

## 2022-02-27 NOTE — Progress Notes (Signed)
Inpatient Rehab Admissions Coordinator:    I spoke with pt. Regarding potential CIR admit (tentatively for tomorrow). She is interested and I confirmed that her mother can provide a home and 24/7 supervision at d/c. I will follow up in the morning.   Megan Salon, MS, CCC-SLP Rehab Admissions Coordinator  [phone] (celll) [phone] (office)

## 2022-02-27 NOTE — Progress Notes (Signed)
HD#18 Subjective:   Summary: Pt is a 31yo woman with prior tubo-ovarian abscess, admitted 6/15-6/18 with septic shock due to recurrent tubo-ovarian abscess then left AMA, who represented on 6/19 with sepsis secondary to multiple pelvic abscesses. She underwent ex lap with washout with OB & General Surgery, and has JP drains still in place.  No acute overnight events. Patient evaluated at bedside.  She reports feeling nauseous since yesterday night, states any smell triggers it including hand sanitizer and even strawberries that her family brought for her. Zofran has helped a little but it has not gone away. She is having some heartburn, throat irritation, and spitting up which is worse when she lies flat. We discussed her protonix use, which she refused yesterday, which may help with her symptoms.   She is complaining of some upper back pain, thinks it is her lungs. Pain does not change with breathing but does worsen with laying on her back, movement, and with palpation of the area. She does not like robaxin as it triggers her heartburn and the tablet is too big to swallow comfortably for her.  Objective:  Vital signs in last 24 hours: Vitals:   02/27/22 0100 02/27/22 0302 02/27/22 0500 02/27/22 0757  BP:  106/68  94/71  Pulse: (!) 130 (!) 126  (!) 115  Resp: (!) 23 20  17   Temp:  98.2 F (36.8 C)  99.3 F (37.4 C)  TempSrc:  Oral  Oral  SpO2: 92% 95%  93%  Weight:   76.8 kg   Height:       Supplemental O2: Room Air    Physical Exam:   General: young female, laying in bed, NAD. HENT: mucus membranes moist. CV: tachycardic rate with normal rhythm. Pulm: slightly tachypneic but unlabored respirations on RA. Abdomen: soft, mildly distended, diffusely tender to palpation. JP drains in place with minimal drainage. Wound Vac in place over surgical site, c/d/i.  MSK: pain with palpation of bilateral upper back, underneath scapulae. No pain with palpation of midline spine  throughout. Neuro: AAOx3, no focal deficits noted.    Filed Weights   02/22/22 0429 02/26/22 0100 02/27/22 0500  Weight: 81.8 kg 73.6 kg 76.8 kg     Intake/Output Summary (Last 24 hours) at 02/27/2022 0953 Last data filed at 02/27/2022 0757 Gross per 24 hour  Intake 10 ml  Output 600 ml  Net -590 ml   Net IO Since Admission: 6,422.96 mL [02/27/22 0953]  Pertinent Labs:    Latest Ref Rng & Units 02/27/2022    5:07 AM 02/26/2022    8:50 AM 02/25/2022    6:00 AM  CBC  WBC 4.0 - 10.5 K/uL 9.8  10.5  11.2   Hemoglobin 12.0 - 15.0 g/dL 9.0  9.0  8.6   Hematocrit 36.0 - 46.0 % 28.5  28.5  27.8   Platelets 150 - 400 K/uL 496  470  464        Latest Ref Rng & Units 02/25/2022    6:00 AM 02/24/2022    5:23 AM 02/23/2022    5:47 AM  CMP  Glucose 70 - 99 mg/dL 108  115  99   BUN 6 - 20 mg/dL 10  10  10    Creatinine 0.44 - 1.00 mg/dL 0.75  0.68  0.64   Sodium 135 - 145 mmol/L 138  133  136   Potassium 3.5 - 5.1 mmol/L 4.2  3.9  3.8   Chloride 98 - 111 mmol/L 101  98  102   CO2 22 - 32 mmol/L 28  27  29    Calcium 8.9 - 10.3 mg/dL 8.7  8.4  8.5   Total Protein 6.5 - 8.1 g/dL   6.9   Total Bilirubin 0.3 - 1.2 mg/dL   0.2   Alkaline Phos 38 - 126 U/L   48   AST 15 - 41 U/L   32   ALT 0 - 44 U/L   14     Imaging: No results found.  Assessment/Plan:   Principal Problem:   Pelvic abscess  Active Problems:   Sepsis (HCC)   MRSA (methicillin resistant staph aureus) culture positive   Hepatic abscess   Hx of Fitz-Hugh-Curtis syndrome   Protein-calorie malnutrition (HCC)   Depression   Patient Summary: Paula Lucas is a 31 y.o. with past medical history of recent admission for tubo-ovarian abscess complicated by septic shock (02/04/22 - 02/07/22) with previous history of tubo-ovarian abscess 2/2 N. Gonorrhea in 2019 who presented to the ED with c/o nausea, vomiting and abdominal pain, currently admitted for multiple pelvic abscesses s/p wash out with ex lap and JP drains placement on  02/16/22.  # Resolved sepsis 2/2 multiple pelvic abscesses, with ongoing abdominal pain She is s/p ex-lap with washout and bowel adhesion lysis with drains in place. Her pain is relatively well controlled aside from some new likely MSK back pain she developed overnight. She is doing better in terms of pain, relatively well controlled with fentanyl patch and oxycodone prn for breakthrough. Remains afebrile with no leukocytosis. Despite some low-normal BP readings, she remains hemodynamically stable. -reach out to gen surg for wound vac management -continue doxycycline until 7/11 -continue rocephin and flagyl until 7/18 -continue fentanyl patch 34mcg/hr and breakthrough oxycodone 5mg  q8 prn -switched from robaxin to flexeril 5mg  TID for MSK pain as robaxin causing heartburn per patient (she has refused robaxin for the past 2 days) -continue to encourage PO intake, working with PT/OT -she is pending evaluation for CIR, may be potential candidate which I think would be good for her in terms of wound vac management and continuing IV rocephin -bowel regimen with miralax, senna-s qhs, and dulcolax suppository   #MRSA bacturia Urine cultures positive for MRSA. ID feels this was from transient bacteremia. TTE negative for endocarditis and thoracic MRI negative for osteo. Afebrile with no leukocytosis. -ID following -Zyvox completed -PICC line in place   #Acute hypoxic respiratory failure 2/2 volume overload & atelectasis Satting ~95% on RA. Thought to be 2/2 atelectasis vs volume overload.  She is not grossly volume overloaded on exam, but this has been difficult to assess. Her weight is up about 3kg since yesterday, lasix was held yesterday. Will give another dose of IV lasix 40mg  today to aim for goal net negative 1L UOP. -iv lasix 40mg  once -supplemental O2 prn -Continue to encourage PT, OOB, incentive spirometry   #Malnutrition Patient's cortrak removed 2 days ago per patient request. She has  been unable to meet nutritional requirements but is trying to take in boost supplements daily (goal for at least 3 boosts/day). She did request a cortrak be placed again but this may not be able to happen until Monday. -encourage PO intake, especially boost -advance diet as tolerated -consider cortrak again on Monday if no improvement in intake  #Bipolar Disorder, Type 1 #Depression Patient is followed by where she receives injection medication for her bipolar and schizophrenia. She is also on Cymbalta at home, which was  restarted yesterday. She is tolerating this well.  -continue Cymbalta   Diet:  Reg  IVF: None, VTE:  Lovenox Code: Full  Merrilyn Puma, MD Internal Medicine Resident PGY-3 Please contact the on call pager after 5 pm and on weekends at [phone].

## 2022-02-27 NOTE — Progress Notes (Signed)
Pt refusing all morning scheduled medications and PRN oxycodone.  Pt stating she "doesn't feel well this morning."  Education provided on medication compliance.  MD notified.  Will continue to educate and attempt medication administration later today.

## 2022-02-27 NOTE — Progress Notes (Addendum)
Notified by RN that patient reports pain in her lungs.  When evaluated bedside, she was resting comfortably.  She reports pain in her upper back in the last few days but more severe today.  Pain is worse with movement.  She could not work with PT due to her pain.  She reports mild dyspnea but denies any chest pain.  Said that her abdominal pain has improved.  Physical exam reveals normal overlying skin of her upper and lower back.  She has significant tenderness to palpation in her mid back around T10-T11 bilateral paraspinal area.  No obvious focal tenderness.  She remains tachycardic but regular rhythm.  At this time, the most likely etiology is MSK pain due to long hospital bed stay.  Infection is on the differential but unlikely given she is on a long course of antibiotic.  All of her blood cultures have been negative so far.  PE was ruled out on recent CTA.  Will give her scheduled oxycodone with 1 dose of 15 mg IV Toradol.  Will also apply lidocaine patch to her back.  Obtain chest x-ray to rule out worsening atelectasis.  Addendum: Patient developed a fever of 100.7.  This could be secondary to atelectasis.  She has had recent MRI of thoracic spine and CTA which did not show any signs of infection or osseous abnormality.  Normally it would be low yield to obtain blood culture while she is on a long course of antibiotic.  However given her high risk situation, will obtain repeat blood culture.  If she has worsening upper thoracic pain, will proceed with further imaging.  Addendum: Patient was sleeping on reevaluation.  Chest x-ray showed right-sided atelectasis with pleural effusion.  Will hold off on diuresis tonight to avoid sleep disruption.

## 2022-02-27 NOTE — Plan of Care (Signed)

## 2022-02-28 ENCOUNTER — Other Ambulatory Visit: Payer: Self-pay

## 2022-02-28 ENCOUNTER — Inpatient Hospital Stay (HOSPITAL_COMMUNITY)
Admission: RE | Admit: 2022-02-28 | Discharge: 2022-03-05 | DRG: 945 | Disposition: A | Discharge status: Home / Self Care | Payer: Medicaid Other | Source: Intra-hospital | Attending: Physical Medicine & Rehabilitation | Admitting: Physical Medicine & Rehabilitation

## 2022-02-28 ENCOUNTER — Encounter (HOSPITAL_COMMUNITY): Payer: Self-pay | Admitting: Physical Medicine & Rehabilitation

## 2022-02-28 DIAGNOSIS — G4700 Insomnia, unspecified: Secondary | ICD-10-CM | POA: Diagnosis present

## 2022-02-28 DIAGNOSIS — R109 Unspecified abdominal pain: Secondary | ICD-10-CM | POA: Diagnosis not present

## 2022-02-28 DIAGNOSIS — G43909 Migraine, unspecified, not intractable, without status migrainosus: Secondary | ICD-10-CM | POA: Diagnosis present

## 2022-02-28 DIAGNOSIS — T82524A Displacement of infusion catheter, initial encounter: Secondary | ICD-10-CM | POA: Diagnosis not present

## 2022-02-28 DIAGNOSIS — F419 Anxiety disorder, unspecified: Secondary | ICD-10-CM | POA: Diagnosis present

## 2022-02-28 DIAGNOSIS — F32A Depression, unspecified: Secondary | ICD-10-CM | POA: Diagnosis not present

## 2022-02-28 DIAGNOSIS — R195 Other fecal abnormalities: Secondary | ICD-10-CM | POA: Diagnosis not present

## 2022-02-28 DIAGNOSIS — Z886 Allergy status to analgesic agent status: Secondary | ICD-10-CM

## 2022-02-28 DIAGNOSIS — R200 Anesthesia of skin: Secondary | ICD-10-CM | POA: Diagnosis present

## 2022-02-28 DIAGNOSIS — K5900 Constipation, unspecified: Secondary | ICD-10-CM | POA: Diagnosis present

## 2022-02-28 DIAGNOSIS — R112 Nausea with vomiting, unspecified: Secondary | ICD-10-CM | POA: Diagnosis not present

## 2022-02-28 DIAGNOSIS — T45516A Underdosing of anticoagulants, initial encounter: Secondary | ICD-10-CM | POA: Diagnosis not present

## 2022-02-28 DIAGNOSIS — N7093 Salpingitis and oophoritis, unspecified: Secondary | ICD-10-CM | POA: Diagnosis present

## 2022-02-28 DIAGNOSIS — J9601 Acute respiratory failure with hypoxia: Secondary | ICD-10-CM | POA: Diagnosis not present

## 2022-02-28 DIAGNOSIS — J90 Pleural effusion, not elsewhere classified: Secondary | ICD-10-CM

## 2022-02-28 DIAGNOSIS — K750 Abscess of liver: Secondary | ICD-10-CM | POA: Diagnosis not present

## 2022-02-28 DIAGNOSIS — R7989 Other specified abnormal findings of blood chemistry: Secondary | ICD-10-CM | POA: Diagnosis not present

## 2022-02-28 DIAGNOSIS — S31109D Unspecified open wound of abdominal wall, unspecified quadrant without penetration into peritoneal cavity, subsequent encounter: Secondary | ICD-10-CM | POA: Diagnosis not present

## 2022-02-28 DIAGNOSIS — N719 Inflammatory disease of uterus, unspecified: Secondary | ICD-10-CM | POA: Diagnosis present

## 2022-02-28 DIAGNOSIS — E8770 Fluid overload, unspecified: Secondary | ICD-10-CM | POA: Diagnosis not present

## 2022-02-28 DIAGNOSIS — F17210 Nicotine dependence, cigarettes, uncomplicated: Secondary | ICD-10-CM | POA: Diagnosis present

## 2022-02-28 DIAGNOSIS — R5381 Other malaise: Principal | ICD-10-CM | POA: Diagnosis present

## 2022-02-28 DIAGNOSIS — E46 Unspecified protein-calorie malnutrition: Secondary | ICD-10-CM | POA: Diagnosis present

## 2022-02-28 DIAGNOSIS — K219 Gastro-esophageal reflux disease without esophagitis: Secondary | ICD-10-CM | POA: Diagnosis present

## 2022-02-28 DIAGNOSIS — R197 Diarrhea, unspecified: Secondary | ICD-10-CM | POA: Diagnosis not present

## 2022-02-28 DIAGNOSIS — Z6826 Body mass index (BMI) 26.0-26.9, adult: Secondary | ICD-10-CM

## 2022-02-28 DIAGNOSIS — T501X5A Adverse effect of loop [high-ceiling] diuretics, initial encounter: Secondary | ICD-10-CM | POA: Diagnosis not present

## 2022-02-28 DIAGNOSIS — D62 Acute posthemorrhagic anemia: Secondary | ICD-10-CM | POA: Diagnosis present

## 2022-02-28 DIAGNOSIS — E876 Hypokalemia: Secondary | ICD-10-CM

## 2022-02-28 DIAGNOSIS — R000 Tachycardia, unspecified: Secondary | ICD-10-CM | POA: Diagnosis not present

## 2022-02-28 DIAGNOSIS — N739 Female pelvic inflammatory disease, unspecified: Secondary | ICD-10-CM | POA: Diagnosis not present

## 2022-02-28 DIAGNOSIS — Z8719 Personal history of other diseases of the digestive system: Secondary | ICD-10-CM

## 2022-02-28 DIAGNOSIS — R0602 Shortness of breath: Secondary | ICD-10-CM | POA: Diagnosis not present

## 2022-02-28 DIAGNOSIS — Z833 Family history of diabetes mellitus: Secondary | ICD-10-CM

## 2022-02-28 DIAGNOSIS — R110 Nausea: Secondary | ICD-10-CM | POA: Diagnosis present

## 2022-02-28 DIAGNOSIS — Z5329 Procedure and treatment not carried out because of patient's decision for other reasons: Secondary | ICD-10-CM | POA: Diagnosis not present

## 2022-02-28 DIAGNOSIS — Z8249 Family history of ischemic heart disease and other diseases of the circulatory system: Secondary | ICD-10-CM

## 2022-02-28 DIAGNOSIS — R509 Fever, unspecified: Secondary | ICD-10-CM | POA: Diagnosis not present

## 2022-02-28 LAB — CBC
HCT: 29.4 % — ABNORMAL LOW (ref 36.0–46.0)
Hemoglobin: 9.4 g/dL — ABNORMAL LOW (ref 12.0–15.0)
MCH: 29 pg (ref 26.0–34.0)
MCHC: 32 g/dL (ref 30.0–36.0)
MCV: 90.7 fL (ref 80.0–100.0)
Platelets: 522 10*3/uL — ABNORMAL HIGH (ref 150–400)
RBC: 3.24 MIL/uL — ABNORMAL LOW (ref 3.87–5.11)
RDW: 19.6 % — ABNORMAL HIGH (ref 11.5–15.5)
WBC: 7.9 10*3/uL (ref 4.0–10.5)
nRBC: 0 % (ref 0.0–0.2)

## 2022-02-28 LAB — CREATININE, SERUM
Creatinine, Ser: 0.87 mg/dL (ref 0.44–1.00)
GFR, Estimated: >60 mL/min (ref >60)

## 2022-02-28 LAB — GLUCOSE, CAPILLARY: Glucose-Capillary: 90 mg/dL (ref 70–99)

## 2022-02-28 MED ORDER — FENTANYL 25 MCG/HR TD PT72: 1.0000 | MEDICATED_PATCH | TRANSDERMAL | 0 refills | Status: DC

## 2022-02-28 MED ORDER — SODIUM CHLORIDE 0.9% FLUSH
10.0000 mL | INTRAVENOUS | Status: DC | PRN
  Administered 2022-02-28: 10 mL

## 2022-02-28 MED ORDER — ENOXAPARIN SODIUM 40 MG/0.4ML IJ SOSY
40.0000 mg | PREFILLED_SYRINGE | INTRAMUSCULAR | Status: DC
  Filled 2022-02-28: qty 0.4

## 2022-02-28 MED ORDER — ONDANSETRON HCL 4 MG PO TABS
4.0000 mg | ORAL_TABLET | Freq: Four times a day (QID) | ORAL | Status: DC | PRN
  Administered 2022-02-28: 4 mg via ORAL
  Filled 2022-02-28 (×2): qty 1

## 2022-02-28 MED ORDER — SODIUM CHLORIDE 0.9 % IV SOLN
2.0000 g | INTRAVENOUS | Status: DC
  Administered 2022-02-28 – 2022-03-02 (×3): 2 g via INTRAVENOUS
  Filled 2022-02-28 (×4): qty 20

## 2022-02-28 MED ORDER — OXYCODONE HCL 5 MG PO TABS
5.0000 mg | ORAL_TABLET | Freq: Three times a day (TID) | ORAL | Status: DC | PRN
  Administered 2022-02-28 – 2022-03-01 (×2): 5 mg via ORAL
  Filled 2022-02-28 (×2): qty 1

## 2022-02-28 MED ORDER — DOXYCYCLINE HYCLATE 100 MG PO CAPS: 100.0000 mg | ORAL_CAPSULE | Freq: Two times a day (BID) | ORAL | 0 refills | Status: DC

## 2022-02-28 MED ORDER — DOXYCYCLINE HYCLATE 100 MG PO TABS
100.0000 mg | ORAL_TABLET | Freq: Two times a day (BID) | ORAL | Status: DC
Start: 2022-02-28 — End: 2022-03-01
  Administered 2022-02-28 – 2022-03-01 (×2): 100 mg via ORAL
  Filled 2022-02-28 (×4): qty 1

## 2022-02-28 MED ORDER — BISACODYL 10 MG RE SUPP: 10.0000 mg | Freq: Every day | RECTAL | Status: DC | PRN

## 2022-02-28 MED ORDER — METRONIDAZOLE 500 MG PO TABS
500.0000 mg | ORAL_TABLET | Freq: Two times a day (BID) | ORAL | Status: DC
Start: 2022-02-28 — End: 2022-03-01
  Administered 2022-02-28 – 2022-03-01 (×2): 500 mg via ORAL
  Filled 2022-02-28 (×2): qty 1

## 2022-02-28 MED ORDER — SENNOSIDES-DOCUSATE SODIUM 8.6-50 MG PO TABS
1.0000 | ORAL_TABLET | Freq: Every day | ORAL | Status: DC
  Filled 2022-02-28: qty 1

## 2022-02-28 MED ORDER — DULOXETINE HCL 60 MG PO CPEP: 60.0000 mg | ORAL_CAPSULE | Freq: Every day | ORAL | 1 refills | Status: DC

## 2022-02-28 MED ORDER — CHLORHEXIDINE GLUCONATE CLOTH 2 % EX PADS
6.0000 | MEDICATED_PAD | Freq: Every day | CUTANEOUS | Status: DC
Start: 2022-02-28 — End: 2022-03-05
  Administered 2022-03-01 – 2022-03-04 (×3): 6 via TOPICAL

## 2022-02-28 MED ORDER — SIMETHICONE 80 MG PO CHEW: 80.0000 mg | CHEWABLE_TABLET | Freq: Four times a day (QID) | ORAL | 0 refills | Status: DC | PRN

## 2022-02-28 MED ORDER — CYCLOBENZAPRINE HCL 5 MG PO TABS
5.0000 mg | ORAL_TABLET | Freq: Three times a day (TID) | ORAL | Status: DC | PRN
Start: 2022-02-28 — End: 2022-03-05
  Administered 2022-03-01: 5 mg via ORAL
  Filled 2022-02-28: qty 1

## 2022-02-28 MED ORDER — OXYCODONE HCL 5 MG PO TABS: 5.0000 mg | ORAL_TABLET | Freq: Three times a day (TID) | ORAL | 0 refills | Status: DC | PRN

## 2022-02-28 MED ORDER — DULOXETINE HCL 30 MG PO CPEP
60.0000 mg | ORAL_CAPSULE | Freq: Every day | ORAL | Status: DC
  Administered 2022-03-01: 60 mg via ORAL
  Filled 2022-02-28: qty 2

## 2022-02-28 MED ORDER — FUROSEMIDE 10 MG/ML IJ SOLN
40.0000 mg | Freq: Once | INTRAMUSCULAR | Status: AC
  Administered 2022-02-28: 40 mg via INTRAVENOUS
  Filled 2022-02-28: qty 4

## 2022-02-28 MED ORDER — ENSURE ENLIVE PO LIQD: 237.0000 mL | Freq: Two times a day (BID) | ORAL | Status: DC

## 2022-02-28 MED ORDER — GABAPENTIN 300 MG PO CAPS
300.0000 mg | ORAL_CAPSULE | Freq: Three times a day (TID) | ORAL | Status: DC
  Administered 2022-02-28 – 2022-03-01 (×4): 300 mg via ORAL
  Filled 2022-02-28 (×5): qty 1

## 2022-02-28 MED ORDER — ONDANSETRON HCL 4 MG PO TABS: 4.0000 mg | ORAL_TABLET | Freq: Four times a day (QID) | ORAL | 0 refills | Status: DC | PRN

## 2022-02-28 MED ORDER — BISACODYL 10 MG RE SUPP: 10.0000 mg | Freq: Every day | RECTAL | 0 refills | Status: DC | PRN

## 2022-02-28 MED ORDER — SIMETHICONE 80 MG PO CHEW: 80.0000 mg | CHEWABLE_TABLET | Freq: Four times a day (QID) | ORAL | Status: DC | PRN

## 2022-02-28 MED ORDER — ORAL CARE MOUTH RINSE: 15.0000 mL | OROMUCOSAL | Status: DC | PRN

## 2022-02-28 MED ORDER — POLYETHYLENE GLYCOL 3350 17 G PO PACK
17.0000 g | PACK | Freq: Two times a day (BID) | ORAL | Status: DC
  Filled 2022-02-28 (×3): qty 1

## 2022-02-28 MED ORDER — FENTANYL 25 MCG/HR TD PT72
1.0000 | MEDICATED_PATCH | TRANSDERMAL | Status: DC
  Administered 2022-03-01: 1 via TRANSDERMAL
  Filled 2022-02-28 (×2): qty 1

## 2022-02-28 MED ORDER — CYCLOBENZAPRINE HCL 5 MG PO TABS: 5.0000 mg | ORAL_TABLET | Freq: Three times a day (TID) | ORAL | Status: DC

## 2022-02-28 MED ORDER — ONDANSETRON HCL 4 MG/2ML IJ SOLN
4.0000 mg | Freq: Four times a day (QID) | INTRAMUSCULAR | 0 refills | Status: DC | PRN
Start: 2022-02-28 — End: 2022-03-05

## 2022-02-28 MED ORDER — METRONIDAZOLE 500 MG PO TABS: 500.0000 mg | ORAL_TABLET | Freq: Two times a day (BID) | ORAL | 0 refills | Status: DC

## 2022-02-28 MED ORDER — PANTOPRAZOLE SODIUM 40 MG PO TBEC
40.0000 mg | DELAYED_RELEASE_TABLET | Freq: Every day | ORAL | Status: DC
Start: 2022-02-28 — End: 2022-03-01
  Administered 2022-02-28 – 2022-03-01 (×2): 40 mg via ORAL
  Filled 2022-02-28 (×2): qty 1

## 2022-02-28 MED ORDER — ONDANSETRON HCL 4 MG/2ML IJ SOLN: 4.0000 mg | Freq: Four times a day (QID) | INTRAMUSCULAR | Status: DC | PRN

## 2022-02-28 MED ORDER — SENNOSIDES-DOCUSATE SODIUM 8.6-50 MG PO TABS: 1.0000 | ORAL_TABLET | Freq: Every day | ORAL | Status: DC

## 2022-02-28 MED ORDER — PANTOPRAZOLE SODIUM 40 MG PO TBEC: 40.0000 mg | DELAYED_RELEASE_TABLET | Freq: Every day | ORAL | 0 refills | Status: DC

## 2022-02-28 MED ORDER — POLYETHYLENE GLYCOL 3350 17 G PO PACK: 17.0000 g | PACK | Freq: Two times a day (BID) | ORAL | 0 refills | Status: DC

## 2022-02-28 MED ORDER — CYCLOBENZAPRINE HCL 10 MG PO TABS: 5.0000 mg | ORAL_TABLET | Freq: Three times a day (TID) | ORAL | 0 refills | Status: DC

## 2022-02-28 MED ORDER — CEFTRIAXONE IV (FOR PTA / DISCHARGE USE ONLY): 2.0000 g | INTRAVENOUS | 0 refills | Status: DC

## 2022-02-28 MED ORDER — GABAPENTIN 300 MG PO CAPS: 300.0000 mg | ORAL_CAPSULE | Freq: Three times a day (TID) | ORAL | 0 refills | Status: DC

## 2022-02-28 MED ORDER — PROCHLORPERAZINE EDISYLATE 10 MG/2ML IJ SOLN: 10.0000 mg | Freq: Four times a day (QID) | INTRAMUSCULAR | 0 refills | Status: DC | PRN

## 2022-02-28 MED ORDER — TRAZODONE HCL 50 MG PO TABS
50.0000 mg | ORAL_TABLET | Freq: Every day | ORAL | Status: DC
  Administered 2022-02-28 – 2022-03-04 (×5): 50 mg via ORAL
  Filled 2022-02-28 (×5): qty 1

## 2022-02-28 MED ORDER — TRAZODONE HCL 50 MG PO TABS: 50.0000 mg | ORAL_TABLET | Freq: Every day | ORAL | 0 refills | Status: DC

## 2022-02-28 NOTE — Discharge Summary (Signed)
Name: Paula Lucas MRN: [id number] DOB: Sep 27, 1990 31 y.o. PCP: Pcp, No  Date of Admission: 02/08/2022 Date of Discharge: 02/28/2022 Attending Physician: Dr. Gilles Chiquito  Discharge Diagnosis: 1. Sepsis (RESOLVED) 2/2 multiple pelvic abscesses s/p ex-lap with washout and bowel adhesion lysis 2. Acute hypoxic respiratory failure (resolved) 2/2 volume overload and atelectasis 3. Malnutrition 4. MRSA bacteriuria s/p completion of zyvox therapy   Discharge Medications:  No current facility-administered medications on file prior to encounter.   Current Outpatient Medications on File Prior to Encounter  Medication Sig Dispense Refill   bisacodyl (DULCOLAX) 10 MG suppository Place 1 suppository (10 mg total) rectally daily as needed for moderate constipation. 12 suppository 0   cefTRIAXone (ROCEPHIN) IVPB Inject 2 g into the vein daily for 9 days. Indication:  Intra-abd abscesses First Dose: Yes Last Day of Therapy:  03/09/22 Labs - Once weekly:  CBC/D and BMP, Labs - Every other week:  ESR and CRP Method of administration: IV Push Pull PICC line at the completion of IV therapy Method of administration may be changed at the discretion of home infusion pharmacist based upon assessment of the patient and/or caregiver's ability to self-administer the medication ordered. 18 Units 0   cyclobenzaprine (FLEXERIL) 10 MG tablet Take 0.5 tablets (5 mg total) by mouth 3 (three) times daily. 60 tablet 0   doxycycline (VIBRAMYCIN) 100 MG capsule Take 1 capsule (100 mg total) by mouth 2 (two) times daily for 11 days. Stop date 03/02/22 5 capsule 0   DULoxetine (CYMBALTA) 60 MG capsule Take 1 capsule (60 mg total) by mouth daily. 30 capsule 1   [START ON 03/01/2022] fentaNYL (DURAGESIC) 25 MCG/HR Place 1 patch onto the skin every 3 (three) days. 5 patch 0   gabapentin (NEURONTIN) 300 MG capsule Take 1 capsule (300 mg total) by mouth 3 (three) times daily. 90 capsule 0   metroNIDAZOLE (FLAGYL) 500 MG  tablet Take 1 tablet (500 mg total) by mouth 2 (two) times daily for 18 days. Stop date 03/09/22 19 tablet 0   ondansetron (ZOFRAN) 4 MG tablet Take 1 tablet (4 mg total) by mouth every 6 (six) hours as needed for nausea. 20 tablet 0   ondansetron (ZOFRAN) 4 MG/2ML SOLN injection Inject 2 mLs (4 mg total) into the vein every 6 (six) hours as needed for nausea. 2 mL 0   oxyCODONE (OXY IR/ROXICODONE) 5 MG immediate release tablet Take 1 tablet (5 mg total) by mouth every 8 (eight) hours as needed for breakthrough pain (if pain uncontrolled with fentanyl patch). 30 tablet 0   [START ON 03/01/2022] pantoprazole (PROTONIX) 40 MG tablet Take 1 tablet (40 mg total) by mouth daily. 30 tablet 0   polyethylene glycol (MIRALAX / GLYCOLAX) 17 g packet Take 17 g by mouth 2 (two) times daily. 14 each 0   prochlorperazine (COMPAZINE) 10 MG/2ML injection Inject 2 mLs (10 mg total) into the vein every 6 (six) hours as needed. 240 mL 0   senna-docusate (SENOKOT-S) 8.6-50 MG tablet Take 1 tablet by mouth at bedtime.     simethicone (MYLICON) 80 MG chewable tablet Chew 1 tablet (80 mg total) by mouth every 6 (six) hours as needed for flatulence (gas pain). 30 tablet 0   traZODone (DESYREL) 50 MG tablet Take 1 tablet (50 mg total) by mouth at bedtime. 30 tablet 0   Allergies  Allergen Reactions   Fish Allergy Anaphylaxis    Severe allergic reaction to all seafoods   Motrin [Ibuprofen] Anaphylaxis  Pt tolerated Ketorolac 04/02/18   Mushroom Extract Complex Anaphylaxis   Shellfish-Derived Products Anaphylaxis   Tylenol [Acetaminophen] Anaphylaxis      Disposition and follow-up:   Paula Lucas was discharged from [facility] in [place] condition.  At the hospital follow up visit please address:  1. Sepsis (RESOLVED) 2/2 multiple pelvic abscesses s/p ex-lap with washout and bowel adhesion lysis -Wound Vac and JP drains in place. Branch RN to help with wound vac management while at CIR. General  surgery to assess prior to discharge from CIR about wound vac management -antibiotics as follows: doxycycline therapy until 7/11; rocephin and flagyl therapy until 7/18 -pain management: fentanyl patch 69mcg/hr q3 days, breakthrough oxycodone 5mg  q8h prn, and flexeril 10mg  TID -bowel regimen with miralax, senna-S, dulcolax to prevent opiate-induced constipation -continue to encourage PO intake (may need cortrak if poor PO intake) to meet nutritional requirements to optimize wound healing  2. Acute hypoxic respiratory failure (resolved) 2/2 volume overload and atelectasis -Pleural effusions and atelectasis noted on imaging following significant intraabdominal surgery for pelvic abscesses. Received lasix with some improvement. -Going to CIR for continue rehab -continue incentive spirometry  3. Malnutrition -unable to meet nutritional requirements at this time -encourage PO intake and advance diet as tolerated -may need cortrak again for nutrition  4. MRSA bacteriuria  -completed zyvox therapy   2.  Labs / imaging needed at time of follow-up: CBC, BMP  3.  Pending labs/ test needing follow-up: blood cultures  Follow-up Appointments:   Hospital Course by problem list: Paula Lucas is a 31 year old female who was hospitalized initially for septic shock from pelvic floor abscess. She underwent drain placement with IR and was briefly transferred to the ICU for treatment. General surgery and OBGYN were consulted. She subsequently underwent ex-lap with abdominal washout with drainage of abscesses, repair SB serosa x3 (Dr. Grandville Silos and Dr. Kennon Rounds, 02/16/2022). She was also found to have MRSA bacteriuria (20K colonies) on urine cultures. Afterwards, she had a fairly prolonged post-op course that involved ongoing antibiotics with zyvox for MRSA bacteriuria (last day 7/7), doxycycline (last date 7/11), rocephin (last date 7/18), and flagyl (last date 7/18). TTE was negative for endocarditis. Thoracic MRI  for back pain was negative for spinal infection. She has since completed zyvox therapy. She has a wound vac in place over midline surgical site that has been helping with wound drainage/recovery. JP drains have been having minimal drainage for the past few days.  Pain control was a difficult task for this patient. She required a prolonged post-op course of IV dilaudid, PO oxycontin, IV toradol, and PO robaxin to help with pain control. Eventually, we were able to wean down to a fentanyl patch 84mcg/hr q3 days and breakthrough oxycodone 5mg  q8h prn. She is also on flexeril 10mg  TID for spasms following surgery (including some back spasms/strain from lying in bed for a long period of time post-op). She has been able to gradually work longer amounts of time with physical therapy and was decided to be a candidate for CIR.  Patient did experience poor appetite following infection and surgery. She developed malnutrition and cortrak was placed for feeds. This was continued up until 3 days ago where patient requested for this to be removed (states otherwise she would pull it out as it was causing throat irritation, dysphagia, GERD). After removal, she has had relatively poor PO intake. She is trying to drink boost for supplementation, but has not been able to have great  intake as she does intermittently experience nausea. Will need to continue to encourage PO intake or may need replacement of cortrak.  Of note, patient also developed acute hypoxic respiratory failure 2/2 volume overload and atelectasis. This was in the setting of poor respiratory effort following intraabdominal surgery. ICS use was encouraged which patient adhered to. However, getting OOB was initially difficult for patient and thus this worsened. Pleural effusions were also noted and a trial of diuresis was performed with some success. Her oxygen requirements worsened, requiring venturi mask for supplemental oxygenation. This has since improved with  diuresis and continued working with PT. She is now back on RA saturating well.  Patient does have a persistent tachycardia up to 110s-120s. It is sinus tachycardia as noted on EKG. ECHO 6/23 showed LVEF 60-65% with no RWMA and normal RV systolic function. Tachycardia is not related to ACS and is likely in the setting of recent septic shock and subsequent surgical intervention.  She will be discharging to CIR today for ongoing rehabilitation. She will be continued on doxycycline until 7/11, rocephin and flagyl until 7/18. Will continue current pain regimen of fentanyl patch and breakthrough oxycodone prn. Wound vac will be managed by WOC until discharge from CIR when general surgery will assess to determine next steps. GYN has cleared patient. She will greatly benefit from intensive therapy. Will require nutritional assessment and increased PO intake, or cortrak may need to be reinserted to ensure adequate nutrition for wound healing.   Discharge Subjective: No acute overnight events aside from experiencing some MSK pain. She is ready to go to CIR and thinks it will help. She is appreciative of the care she has gotten here.  Discharge Exam:   LMP 10/26/2021  Discharge exam:  General: young female, laying in bed, NAD. HENT: mucus membranes moist CV: tachycardic rate with normal rhythm. Pulm: CTABL, normal WOB on RA. Abdomen: soft, nondistended, tender to palpation but improved from yesterday. JP drains with no drainage noted. Wound vac in place over surgical site, c/d/I. MSK: pain on palpation of paraspinal upper back underneath scapulae. No pain on palpation of midline spine, no point tenderness. Neuro: AAOx3, no focal deficits Psych: normal mood and affect.   Pertinent Labs, Studies, and Procedures:     Latest Ref Rng & Units 02/28/2022    6:00 AM 02/27/2022    5:07 AM 02/26/2022    8:50 AM  CBC  WBC 4.0 - 10.5 K/uL 7.9  9.8  10.5   Hemoglobin 12.0 - 15.0 g/dL 9.4  9.0  9.0   Hematocrit  36.0 - 46.0 % 29.4  28.5  28.5   Platelets 150 - 400 K/uL 522  496  470       Latest Ref Rng & Units 02/25/2022    6:00 AM 02/24/2022    5:23 AM 02/23/2022    5:47 AM  CMP  Glucose 70 - 99 mg/dL 108  115  99   BUN 6 - 20 mg/dL $Remove'10  10  10   'QABNkFA$ Creatinine 0.44 - 1.00 mg/dL 0.75  0.68  0.64   Sodium 135 - 145 mmol/L 138  133  136   Potassium 3.5 - 5.1 mmol/L 4.2  3.9  3.8   Chloride 98 - 111 mmol/L 101  98  102   CO2 22 - 32 mmol/L $RemoveB'28  27  29   'kCgvoqcv$ Calcium 8.9 - 10.3 mg/dL 8.7  8.4  8.5   Total Protein 6.5 - 8.1 g/dL   6.9  Total Bilirubin 0.3 - 1.2 mg/dL   0.2   Alkaline Phos 38 - 126 U/L   48   AST 15 - 41 U/L   32   ALT 0 - 44 U/L   14    Blood Culture    Component Value Date/Time   SDES BLOOD LEFT ARM 02/22/2022 0104   SPECREQUEST  02/22/2022 0104    BOTTLES DRAWN AEROBIC AND ANAEROBIC Blood Culture adequate volume   CULT  02/22/2022 0104    NO GROWTH 5 DAYS Performed at [facility], [place] [address] [place], [place] [address]    REPTSTATUS 02/27/2022 FINAL 02/22/2022 0104   Lactic Acid, Venous    Component Value Date/Time   LATICACIDVEN 1.2 02/22/2022 0053      DG CHEST PORT 1 VIEW  Result Date: 02/27/2022 CLINICAL DATA:  Shortness of breath EXAM: PORTABLE CHEST 1 VIEW COMPARISON:  02/25/2022 FINDINGS: Check shadow is stable. Feeding catheter has been removed in the interval. The overall inspiratory effort is poor. Basilar atelectatic changes are noted on the right as well as pleural effusion layering posteriorly. Right-sided PICC is noted with the tip in the left innominate vein. This is a change when compared with the prior exam. No bony abnormality is noted. IMPRESSION: Posteriorly layering effusion on the right. Malposition of the right-sided PICC as described. Electronically Signed   By: Inez Catalina M.D.   On: 02/27/2022 22:16   DG CHEST PORT 1 VIEW  Result Date: 02/25/2022 CLINICAL DATA:  Hypoxia EXAM: PORTABLE CHEST 1 VIEW COMPARISON:  CT examination dated February 22, 2022 FINDINGS: The heart size and mediastinal contours are within normal limits. Low lung volumes with bibasilar opacities suggesting atelectasis and/or pleural effusions, not significantly changed. The visualized skeletal structures are unremarkable. Feeding tube coursing below the diaphragm, distal tip not included. Right PICC with distal tip in the SVC, unchanged. IMPRESSION: 1.  Lines and tubes are unchanged. 2. Low lung volumes with bibasilar opacities suggesting atelectasis and/or effusions, unchanged. Electronically Signed   By: Keane Police D.O.   On: 02/25/2022 08:50   CT Angio Chest Pulmonary Embolism (PE) W or WO Contrast  Result Date: 02/22/2022 CLINICAL DATA:  Pulmonary embolism (PE) suspected, high prob Acute hypoxic respiratory failure, postsurgical. EXAM: CT ANGIOGRAPHY CHEST WITH CONTRAST TECHNIQUE: Multidetector CT imaging of the chest was performed using the standard protocol during bolus administration of intravenous contrast. Multiplanar CT image reconstructions and MIPs were obtained to evaluate the vascular anatomy. RADIATION DOSE REDUCTION: This exam was performed according to the departmental dose-optimization program which includes automated exposure control, adjustment of the mA and/or kV according to patient size and/or use of iterative reconstruction technique. CONTRAST:  5mL OMNIPAQUE IOHEXOL 350 MG/ML SOLN COMPARISON:  Chest radiograph yesterday. Thoracic spine MRI yesterday. Lung bases from recent abdominal CT reviewed. FINDINGS: Cardiovascular: There are no filling defects within the pulmonary arteries to suggest pulmonary embolus. No aortic dissection or acute aortic findings. Common origin of brachiocephalic and left common carotid artery, variant arch anatomy. The heart is normal in size. Trace pericardial fluid anteriorly. Mediastinum/Nodes: Feeding tube decompresses the esophagus. No mediastinal adenopathy. No bulky hilar adenopathy. No suspicious axillary adenopathy.  Lungs/Pleura: Moderate bilateral pleural effusions. There is associated compressive atelectasis. This includes near complete atelectasis of the left lower lobe. No features of pulmonary edema. The trachea and central bronchi are patent. Upper Abdomen: Small amount of perihepatic fluid adjacent to the anterior right hepatic lobe, also seen on prior abdominal CT, series 6,  image 89, not significantly changed. Musculoskeletal: There are no acute or suspicious osseous abnormalities. Mild body wall edema. Review of the MIP images confirms the above findings. IMPRESSION: 1. No pulmonary embolus. 2. Moderate bilateral pleural effusions with associated compressive atelectasis. This includes near complete atelectasis of the left lower lobe. 3. Small amount of perihepatic fluid, also seen on prior abdominal CT. This is not well assessed on the current exam due to phase of contrast. Electronically Signed   By: Keith Rake M.D.   On: 02/22/2022 00:50   DG CHEST PORT 1 VIEW  Result Date: 02/21/2022 CLINICAL DATA:  Dyspnea EXAM: PORTABLE CHEST 1 VIEW COMPARISON:  02/20/2022 FINDINGS: Feeding catheter is noted extending into the stomach. Right-sided PICC is noted in satisfactory position. Cardiac shadow is stable. Persistent left retrocardiac density is noted. Mild right basilar atelectasis is noted as well. Small effusions are seen. Stable vascular congestion is noted. IMPRESSION: Overall appearance of the chest is stable from the prior exam. Electronically Signed   By: Inez Catalina M.D.   On: 02/21/2022 23:12   MR THORACIC SPINE W WO CONTRAST  Result Date: 02/21/2022 CLINICAL DATA:  31 year old female. History of MRSA, PID. Pelvic and abdominal abscesses. EXAM: MRI THORACIC WITHOUT AND WITH CONTRAST TECHNIQUE: Multiplanar and multiecho pulse sequences of the thoracic spine were obtained without and with intravenous contrast. CONTRAST:  7.38mL GADAVIST GADOBUTROL 1 MMOL/ML IV SOLN COMPARISON:  Lumbar MRI 02/12/2022. CT  Abdomen and Pelvis 02/14/2022. FINDINGS: Limited cervical spine imaging: Negative aside from mild straightening and reversal of cervical lordosis. Thoracic spine segmentation: Appears to be normal and concordant with the lumbar numbering last month. Alignment: Maintained thoracic vertebral height and alignment. Relatively normal kyphosis. Vertebrae: No marrow edema or evidence of acute osseous abnormality. Somewhat generalized decreased T1 marrow signal in the visible spine and ribs may reflect red marrow reactivation. Cord: No abnormal intradural enhancement or dural thickening. No intraspinal fluid collection is evident. Thoracic spinal cord signal and morphology are within normal limits. Paraspinal and other soft tissues: Bilateral pleural effusions and compressed lung parenchyma similar to the recent CT Abdomen and Pelvis. Negative visible kidneys. Thoracic paraspinal soft tissues remain within normal limits. Thoracic epidural space is within normal limits. Disc levels: Maintained normal thoracic intervertebral disc signal and morphology throughout. There is mild bilateral facet hypertrophy at T9-T10. No thoracic spinal stenosis. No significant thoracic neural foraminal stenosis. IMPRESSION: 1. No evidence of thoracic spinal infection. Normal thoracic discs. No thoracic spinal stenosis or significant neural impingement. 2. Bilateral pleural effusions and compressed lung parenchyma similar to the recent CT Abdomen and Pelvis. Electronically Signed   By: Genevie Ann M.D.   On: 02/21/2022 10:15   DG CHEST PORT 1 VIEW  Result Date: 02/20/2022 CLINICAL DATA:  Shortness of breath EXAM: PORTABLE CHEST - 1 VIEW COMPARISON:  02/18/2022 FINDINGS: Unchanged mild cardiomegaly and mild pulmonary vascular congestion. Bibasilar opacities likely combination of small pleural effusions with adjacent atelectasis. Feeding tube terminates in the left upper quadrant. Right upper extremity PICC terminates at the cavoatrial junction.  IMPRESSION: 1. Mild cardiomegaly. 2. Mild pulmonary vascular congestion. 3. Small bilateral pleural effusions with adjacent atelectasis. Electronically Signed   By: Miachel Roux M.D.   On: 02/20/2022 09:30   DG Abd Portable 1V  Result Date: 02/19/2022 CLINICAL DATA:  Provided history: Encounter for feeding tube placement. EXAM: PORTABLE ABDOMEN - 1 VIEW COMPARISON:  Abdominal radiograph 02/18/2022. FINDINGS: An enteric tube passes below the level left hemidiaphragm with tip projecting in the  expected location of the pylorus or first portion of the duodenum. Nonobstructive bowel gas pattern within the relies abdomen. No acute bony abnormality identified. IMPRESSION: Enteric tube present with tip projecting in the expected location of the pylorus or first portion of the duodenum. Nonspecific, nonobstructive bowel gas pattern within the visualized abdomen. Electronically Signed   By: Kellie Simmering D.O.   On: 02/19/2022 14:20   DG CHEST PORT 1 VIEW  Result Date: 02/18/2022 CLINICAL DATA:  Central line placement EXAM: PORTABLE CHEST 1 VIEW COMPARISON:  Radiograph 09/06/2021 FINDINGS: Right upper extremity PICC tip crosses the midline, across the brachiocephalic confluence, tip overlies the left brachiocephalic vein. Borderline enlarged cardiac silhouette. Bibasilar consolidations, left greater than right. Small bilateral pleural effusions. Low lung volumes. No pneumothorax. No acute osseous abnormality. IMPRESSION: Right upper extremity PICC tip courses across the brachiocephalic confluence, tip overlying the left brachiocephalic vein. Recommend repositioning. Bilateral pleural effusions with adjacent bibasilar atelectasis, left greater than right. Low lung volumes. These results will be called to the ordering clinician or representative by the Radiologist Assistant, and communication documented in the PACS or [employer]. Electronically Signed   By: Maurine Simmering M.D.   On: 02/18/2022 18:18   [place] EKG SITE  RITE  Result Date: 02/18/2022 If Site Rite image not attached, placement could not be confirmed due to current cardiac rhythm.  DG Abd Portable 1V  Result Date: 02/18/2022 CLINICAL DATA:  Abdominal distension. EXAM: PORTABLE ABDOMEN - 1 VIEW COMPARISON:  Abdominal CT February 14, 2022 FINDINGS: Drainage catheters again seen in the lower abdomen/pelvis. No significant change in the appearance of the bowel pattern. No evidence of small-bowel obstruction. IMPRESSION: Stable appearance of the bowel pattern. Electronically Signed   By: Fidela Salisbury M.D.   On: 02/18/2022 12:52   CT ABDOMEN PELVIS W CONTRAST  Result Date: 02/14/2022 CLINICAL DATA:  Follow-up intra-abdominal abscesses. EXAM: CT ABDOMEN AND PELVIS WITH CONTRAST TECHNIQUE: Multidetector CT imaging of the abdomen and pelvis was performed using the standard protocol following bolus administration of intravenous contrast. RADIATION DOSE REDUCTION: This exam was performed according to the departmental dose-optimization program which includes automated exposure control, adjustment of the mA and/or kV according to patient size and/or use of iterative reconstruction technique. CONTRAST:  13mL OMNIPAQUE IOHEXOL 300 MG/ML  SOLN COMPARISON:  CT scan 02/08/2022 FINDINGS: Lower chest: Persistent moderate-sized pleural effusions with overlying atelectasis. Hepatobiliary: Small residual lentiform rim enhancing abscess at the dome of the liver anteriorly measuring a maximum of 5.6 x 1.1 cm. This previously measured approximately 9.3 x 1.1 cm. Inferiorly there is a persistent perihepatic abscess. It measures approximately 9.5 x 3.1 cm and previously measured 10.1 x 3.7 cm. No worrisome hepatic lesions or intrahepatic biliary dilatation. The gallbladder is unremarkable. No common bile duct dilatation. Pancreas: No mass, inflammation or ductal dilatation. Spleen: Normal size.  No focal lesions. Adrenals/Urinary Tract: Adrenal glands and kidneys are unremarkable  and stable. The bladder is grossly normal. Stomach/Bowel: The stomach, duodenum, small bowel and colon grossly normal. Vascular/Lymphatic: The aorta and branch vessels are patent. The major venous structures are patent. Stable borderline enlarged mesenteric and retroperitoneal lymph nodes, likely reactive/inflammatory. Reproductive: The uterus and ovaries are stable. Other: Percutaneous drainage catheter in the upper right pelvis with a persistent rim enhancing abscess measuring approximately 8.8 x 3.5 cm and previously measuring 9.8 x 5.6 cm. A second drainage catheter in the lower right cul-de-sac area with a small persistent rim enhancing abscess measuring 4.1 x 2.0 cm. This previously measured  5.2 x 4.9 cm. Left-sided rounded pelvic abscess measures 3.6 x 2.9 cm on image 71/3. This previously measured 3.5 x 2.7 cm. Persistent diffuse mesenteric and subcutaneous inflammations/edema. Musculoskeletal: No significant bony findings. IMPRESSION: 1. Persistent moderate-sized pleural effusions with overlying atelectasis. 2. Persistent but slightly smaller undrained perihepatic abscesses. 3. Two right-sided pelvic drainage catheters with persistent but smaller surrounding abscesses. 4. Left-sided undrained pelvic abscess is slightly smaller also. 5. Persistent diffuse mesenteric and subcutaneous inflammation. 6. Stable borderline enlarged mesenteric and retroperitoneal lymph nodes, likely reactive/inflammatory. Electronically Signed   By: Marijo Sanes M.D.   On: 02/14/2022 14:46   MR Lumbar Spine W Wo Contrast  Result Date: 02/13/2022 CLINICAL DATA:  Initial evaluation for back pain, history of MRSA bacteremia. EXAM: MRI LUMBAR SPINE WITHOUT AND WITH CONTRAST TECHNIQUE: Multiplanar and multiecho pulse sequences of the lumbar spine were obtained without and with intravenous contrast. CONTRAST:  7.19mL GADAVIST GADOBUTROL 1 MMOL/ML IV SOLN COMPARISON:  Prior CT from 02/08/2022. FINDINGS: Segmentation: Standard. Lowest  well-formed disc space labeled the L5-S1 level. Alignment: Mild levoscoliosis with straightening of the normal lumbar lordosis. No listhesis. Vertebrae: Vertebral body height maintained without acute or chronic fracture. Bone marrow signal intensity diffusely decreased on T1 weighted sequence, nonspecific, but most commonly related to anemia, smoking or obesity. No discrete or worrisome osseous lesions. No evidence for osteomyelitis discitis or septic arthritis within the lumbar spine. Conus medullaris and cauda equina: Conus extends to the L1-2 level. Conus and cauda equina appear normal. Paraspinal and other soft tissues: Paraspinous soft tissues demonstrate no acute finding. Multi loculated collections/abscesses with surrounding enhancement partially seen within the partially visualized pelvis, better characterized on recent CT. Remainder of the visualized visceral structures otherwise unremarkable. Disc levels: L1-2:  Unremarkable. L2-3: Small left subarticular disc protrusion mildly indents the left ventral thecal sac (series 12, image 16). Borderline mild narrowing of the left lateral recess. Central canal remains widely patent. No foraminal stenosis. L3-4:  Unremarkable. L4-5:  Unremarkable. L5-S1: Left subarticular disc protrusion extends into the left lateral recess, contacting the descending left S1 nerve root (series 12, image 34). Resultant mild left lateral recess stenosis. Central canal remains patent. No significant foraminal stenosis. IMPRESSION: 1. No MRI evidence for acute infection within the lumbar spine. 2. Left subarticular disc protrusion at L5-S1, contacting the descending left S1 nerve root in the left lateral recess. 3. Small left subarticular disc protrusion at L2-3 with borderline mild left lateral recess stenosis. 4. Multiloculated collections/abscesses within the partially visualized pelvis, better characterized on recent CT. Electronically Signed   By: Jeannine Boga M.D.   On:  02/13/2022 04:04   ECHOCARDIOGRAM COMPLETE  Result Date: 02/12/2022    ECHOCARDIOGRAM REPORT   Patient Name:   TANIQUE MATNEY Date of Exam: 02/12/2022 Medical Rec #:  [id number]        Height:       63.0 in Accession #:    [id number]       Weight:       169.5 lb Date of Birth:  [birth year]        BSA:          1.802 m Patient Age:    31 years         BP:           113/87 mmHg Patient Gender: F                HR:           98 bpm.  Exam Location:  Inpatient Procedure: 2D Echo, Cardiac Doppler and Color Doppler Indications:   Staph aureus infection  History:       Patient has no prior history of Echocardiogram examinations.                Sepsis.  Sonographer:   Ross Ludwig RDCS (AE) Referring      TRUNG T VU Phys: IMPRESSIONS  1. Left ventricular ejection fraction, by estimation, is 60 to 65%. The left ventricle has normal function. The left ventricle has no regional wall motion abnormalities. Left ventricular diastolic parameters were normal.  2. Right ventricular systolic function is normal. The right ventricular size is normal.  3. The mitral valve is normal in structure. Trivial mitral valve regurgitation. No evidence of mitral stenosis.  4. The aortic valve is normal in structure. Aortic valve regurgitation is not visualized. No aortic stenosis is present. FINDINGS  Left Ventricle: Left ventricular ejection fraction, by estimation, is 60 to 65%. The left ventricle has normal function. The left ventricle has no regional wall motion abnormalities. The left ventricular internal cavity size was normal in size. There is  no left ventricular hypertrophy. Left ventricular diastolic parameters were normal. Right Ventricle: The right ventricular size is normal. Right vetricular wall thickness was not well visualized. Right ventricular systolic function is normal. Left Atrium: Left atrial size was normal in size. Right Atrium: Right atrial size was normal in size. Pericardium: There is no evidence of pericardial  effusion. Mitral Valve: The mitral valve is normal in structure. Trivial mitral valve regurgitation. No evidence of mitral valve stenosis. Tricuspid Valve: The tricuspid valve is normal in structure. Tricuspid valve regurgitation is trivial. Aortic Valve: The aortic valve is normal in structure. Aortic valve regurgitation is not visualized. No aortic stenosis is present. Aortic valve mean gradient measures 4.0 mmHg. Aortic valve peak gradient measures 7.1 mmHg. Aortic valve area, by VTI measures 2.25 cm. Pulmonic Valve: The pulmonic valve was normal in structure. Pulmonic valve regurgitation is not visualized. Aorta: The aortic root and ascending aorta are structurally normal, with no evidence of dilitation. IAS/Shunts: The atrial septum is grossly normal.  LEFT VENTRICLE PLAX 2D LVIDd:         4.80 cm LVIDs:         3.40 cm LV PW:         0.90 cm LV IVS:        1.10 cm LVOT diam:     1.90 cm LV SV:         49 LV SV Index:   27 LVOT Area:     2.84 cm  RIGHT VENTRICLE             IVC RV Basal diam:  2.30 cm     IVC diam: 1.50 cm RV S prime:     12.40 cm/s TAPSE (M-mode): 2.1 cm LEFT ATRIUM             Index        RIGHT ATRIUM          Index LA diam:        3.80 cm 2.11 cm/m   RA Area:     9.13 cm LA Vol (A2C):   26.4 ml 14.65 ml/m  RA Volume:   15.80 ml 8.77 ml/m LA Vol (A4C):   29.7 ml 16.48 ml/m LA Biplane Vol: 29.3 ml 16.26 ml/m  AORTIC VALVE AV Area (Vmax):    2.24 cm AV Area (Vmean):  2.21 cm AV Area (VTI):     2.25 cm AV Vmax:           133.00 cm/s AV Vmean:          87.900 cm/s AV VTI:            0.218 m AV Peak Grad:      7.1 mmHg AV Mean Grad:      4.0 mmHg LVOT Vmax:         105.00 cm/s LVOT Vmean:        68.600 cm/s LVOT VTI:          0.173 m LVOT/AV VTI ratio: 0.79  AORTA Ao Root diam: 2.90 cm Ao Asc diam:  2.70 cm  SHUNTS Systemic VTI:  0.17 m Systemic Diam: 1.90 cm Mertie Moores MD Electronically signed by Mertie Moores MD Signature Date/Time: 02/12/2022/4:39:15 PM    Final    VAS [place] LOWER  EXTREMITY VENOUS (DVT)  Result Date: 02/12/2022  Lower Venous DVT Study Patient Name:  SADA MAZZONI Moehle  Date of Exam:   02/12/2022 Medical Rec #: [id number]         Accession #:    [id number] Date of Birth: [birth year]         Patient Gender: F Patient Age:   45 years Exam Location:  [facility] Procedure:      VAS [place] LOWER EXTREMITY VENOUS (DVT) Referring Phys: JULIE MACHEN --------------------------------------------------------------------------------  Indications: Pain.  Risk Factors: None identified. Limitations: Poor patient cooperation, patient pain tolerance. Comparison Study: No prior studies. Performing Technologist: Oliver Hum RVT  Examination Guidelines: A complete evaluation includes B-mode imaging, spectral Doppler, color Doppler, and power Doppler as needed of all accessible portions of each vessel. Bilateral testing is considered an integral part of a complete examination. Limited examinations for reoccurring indications may be performed as noted. The reflux portion of the exam is performed with the patient in reverse Trendelenburg.  +---------+---------------+---------+-----------+----------+--------------+ RIGHT    CompressibilityPhasicitySpontaneityPropertiesThrombus Aging +---------+---------------+---------+-----------+----------+--------------+ CFV      Full           Yes      Yes                                 +---------+---------------+---------+-----------+----------+--------------+ SFJ      Full                                                        +---------+---------------+---------+-----------+----------+--------------+ FV Prox  Full                                                        +---------+---------------+---------+-----------+----------+--------------+ FV Mid   Full                                                        +---------+---------------+---------+-----------+----------+--------------+ FV DistalFull                                                         +---------+---------------+---------+-----------+----------+--------------+  PFV      Full                                                        +---------+---------------+---------+-----------+----------+--------------+ POP      Full           Yes      Yes                                 +---------+---------------+---------+-----------+----------+--------------+ PTV      Full                                                        +---------+---------------+---------+-----------+----------+--------------+ PERO     Full                                                        +---------+---------------+---------+-----------+----------+--------------+   +----+---------------+---------+-----------+----------+--------------+ LEFTCompressibilityPhasicitySpontaneityPropertiesThrombus Aging +----+---------------+---------+-----------+----------+--------------+ CFV Full           Yes      Yes                                 +----+---------------+---------+-----------+----------+--------------+    Summary: RIGHT: - There is no evidence of deep vein thrombosis in the lower extremity.  - No cystic structure found in the popliteal fossa.  LEFT: - No evidence of common femoral vein obstruction.  *See table(s) above for measurements and observations. Electronically signed by Monica Martinez MD on 02/12/2022 at 11:23:51 AM.    Final    CT IMAGE GUIDED DRAINAGE BY PERCUTANEOUS CATHETER  Result Date: 02/10/2022 INDICATION: History of tubo-ovarian abscess, post CT-guided placement right lower abdominal/pelvic drainage catheter on 02/05/2022. Postprocedural CT scan performed 02/08/2022 demonstrates an unchanged fluid collection within the right lower pelvis and as such request made for placement of an additional percutaneous drainage catheter for infection source control purposes. Additionally, there is request for CT-guided exchange and/or  repositioning of the pre-existing drainage catheter into the more dominant component of the residual pelvic abscess EXAM: 1. CT GUIDED RIGHT TRANS GLUTEAL APPROACH TUBO-OVARIAN ABSCESS DRAINAGE CATHETER PLACEMENT 2. CT-GUIDED EXCHANGE AND REPOSITIONING OF EXISTING RIGHT LOWER ABDOMINAL/PELVIC DRAINAGE CATHETER COMPARISON:  CT abdomen pelvis-02/08/2022; 02/04/2022 CT guided right lower abdominal/pelvic abscess drainage catheter placement-02/05/2022 MEDICATIONS: The patient is currently admitted to the hospital and receiving intravenous antibiotics. The antibiotics were administered within an appropriate time frame prior to the initiation of the procedure. ANESTHESIA/SEDATION: Moderate (conscious) sedation was employed during this procedure as administered by the Interventional Radiology RN. A total of Versed 4 mg, Dilaudid 1 mg and Fentanyl 150 mcg was administered intravenously. Moderate Sedation Time: 60 minutes. The patient's level of consciousness and vital signs were monitored continuously by radiology nursing throughout the procedure under my direct supervision. CONTRAST:  None COMPLICATIONS: None immediate. PROCEDURE: RADIATION DOSE REDUCTION: This exam was performed according to the departmental dose-optimization program which includes  automated exposure control, adjustment of the mA and/or kV according to patient size and/or use of iterative reconstruction technique. Informed written consent was obtained from the patient after a discussion of the risks, benefits and alternatives to treatment. The patient was initially placed prone on the CT gantry and a pre procedural CT was performed re-demonstrating the known abscess/fluid collection within the right hemipelvis with dominant component measuring approximately 5.5 x 5.4 cm (image 11, series 2). The procedure was planned. A timeout was performed prior to the initiation of the procedure. The skin overlying the right buttocks was prepped and draped in the usual  sterile fashion. The overlying soft tissues were anesthetized with 1% lidocaine with epinephrine. Appropriate trajectory was planned with the use of a 22 gauge spinal needle. An 18 gauge trocar needle was advanced into the abscess/fluid collection and a short Amplatz super stiff wire was coiled within the collection. Appropriate positioning was confirmed with a limited CT scan. The tract was serially dilated allowing placement of a 10 [place] all-purpose drainage catheter. Appropriate positioning was confirmed with a limited postprocedural CT scan. Approximately 65 ml of purulent appearing serous fluid was aspirated. The tube was connected to a drainage bag and secured in place within interrupted suture and a Stat Lock device. A dressing was applied. The patient was then repositioned supine on the CT gantry and noncontrast images were obtained of the lower pelvis and pre-existing right anterior approach lower abdominal/pelvic drainage catheter. Attempts were made to retract the existing drainage catheter into the dominant component of the residual collection however this proved uncomfortable for the patient. As such, the external portion of the drainage catheter was prepped and draped in the usual sterile fashion. The external portion of the drainage catheter was cut and cannulated with a short Amplatz wire. Existing 12 French drainage catheter was then exchanged for a new 12 French percutaneous catheter which under intermittent CT guidance was repositioned into the dominant residual component of the right lower quadrant collection (series 11). The drainage catheter was connected to a gravity bag and secured in place within interrupted suture and a Stat Lock device. Patient tolerated the above procedures well (though did require a generous amount of conscious sedation medications) without immediate postprocedural complication. IMPRESSION: 1. Successful CT guided placement of a 10 [place] all purpose drain catheter  into the undrained pelvic abscess via right trans gluteal approach with aspiration of 65 mL of purulent fluid. Samples were sent to the laboratory as requested by the ordering clinical team. 2. Successful CT guided exchange and repositioning of pre-existing 12 French right anterior abdominal/pelvic drainage catheter with end now coiled and locked within the residual anteriorly located abscess cavity. Electronically Signed   By: Sandi Mariscal M.D.   On: 02/10/2022 16:33   CT ABSCESS CATH EXCHANGE  Result Date: 02/10/2022 INDICATION: History of tubo-ovarian abscess, post CT-guided placement right lower abdominal/pelvic drainage catheter on 02/05/2022. Postprocedural CT scan performed 02/08/2022 demonstrates an unchanged fluid collection within the right lower pelvis and as such request made for placement of an additional percutaneous drainage catheter for infection source control purposes. Additionally, there is request for CT-guided exchange and/or repositioning of the pre-existing drainage catheter into the more dominant component of the residual pelvic abscess EXAM: 1. CT GUIDED RIGHT TRANS GLUTEAL APPROACH TUBO-OVARIAN ABSCESS DRAINAGE CATHETER PLACEMENT 2. CT-GUIDED EXCHANGE AND REPOSITIONING OF EXISTING RIGHT LOWER ABDOMINAL/PELVIC DRAINAGE CATHETER COMPARISON:  CT abdomen pelvis-02/08/2022; 02/04/2022 CT guided right lower abdominal/pelvic abscess drainage catheter placement-02/05/2022 MEDICATIONS: The patient is currently  admitted to the hospital and receiving intravenous antibiotics. The antibiotics were administered within an appropriate time frame prior to the initiation of the procedure. ANESTHESIA/SEDATION: Moderate (conscious) sedation was employed during this procedure as administered by the Interventional Radiology RN. A total of Versed 4 mg, Dilaudid 1 mg and Fentanyl 150 mcg was administered intravenously. Moderate Sedation Time: 60 minutes. The patient's level of consciousness and vital signs were  monitored continuously by radiology nursing throughout the procedure under my direct supervision. CONTRAST:  None COMPLICATIONS: None immediate. PROCEDURE: RADIATION DOSE REDUCTION: This exam was performed according to the departmental dose-optimization program which includes automated exposure control, adjustment of the mA and/or kV according to patient size and/or use of iterative reconstruction technique. Informed written consent was obtained from the patient after a discussion of the risks, benefits and alternatives to treatment. The patient was initially placed prone on the CT gantry and a pre procedural CT was performed re-demonstrating the known abscess/fluid collection within the right hemipelvis with dominant component measuring approximately 5.5 x 5.4 cm (image 11, series 2). The procedure was planned. A timeout was performed prior to the initiation of the procedure. The skin overlying the right buttocks was prepped and draped in the usual sterile fashion. The overlying soft tissues were anesthetized with 1% lidocaine with epinephrine. Appropriate trajectory was planned with the use of a 22 gauge spinal needle. An 18 gauge trocar needle was advanced into the abscess/fluid collection and a short Amplatz super stiff wire was coiled within the collection. Appropriate positioning was confirmed with a limited CT scan. The tract was serially dilated allowing placement of a 10 [place] all-purpose drainage catheter. Appropriate positioning was confirmed with a limited postprocedural CT scan. Approximately 65 ml of purulent appearing serous fluid was aspirated. The tube was connected to a drainage bag and secured in place within interrupted suture and a Stat Lock device. A dressing was applied. The patient was then repositioned supine on the CT gantry and noncontrast images were obtained of the lower pelvis and pre-existing right anterior approach lower abdominal/pelvic drainage catheter. Attempts were made to  retract the existing drainage catheter into the dominant component of the residual collection however this proved uncomfortable for the patient. As such, the external portion of the drainage catheter was prepped and draped in the usual sterile fashion. The external portion of the drainage catheter was cut and cannulated with a short Amplatz wire. Existing 12 French drainage catheter was then exchanged for a new 12 French percutaneous catheter which under intermittent CT guidance was repositioned into the dominant residual component of the right lower quadrant collection (series 11). The drainage catheter was connected to a gravity bag and secured in place within interrupted suture and a Stat Lock device. Patient tolerated the above procedures well (though did require a generous amount of conscious sedation medications) without immediate postprocedural complication. IMPRESSION: 1. Successful CT guided placement of a 10 [place] all purpose drain catheter into the undrained pelvic abscess via right trans gluteal approach with aspiration of 65 mL of purulent fluid. Samples were sent to the laboratory as requested by the ordering clinical team. 2. Successful CT guided exchange and repositioning of pre-existing 12 French right anterior abdominal/pelvic drainage catheter with end now coiled and locked within the residual anteriorly located abscess cavity. Electronically Signed   By: Sandi Mariscal M.D.   On: 02/10/2022 16:33   CT ABDOMEN PELVIS W CONTRAST  Result Date: 02/08/2022 CLINICAL DATA:  Sepsis EXAM: CT ABDOMEN AND PELVIS WITH CONTRAST TECHNIQUE:  Multidetector CT imaging of the abdomen and pelvis was performed using the standard protocol following bolus administration of intravenous contrast. RADIATION DOSE REDUCTION: This exam was performed according to the departmental dose-optimization program which includes automated exposure control, adjustment of the mA and/or kV according to patient size and/or use of  iterative reconstruction technique. CONTRAST:  164mL OMNIPAQUE IOHEXOL 300 MG/ML  SOLN COMPARISON:  Previous studies including the CT done on 02-27-22 FINDINGS: Lower chest: Small bilateral pleural effusions are seen more so on the right side. There are linear patchy infiltrates in the lingula and both lower lobes suggesting atelectasis/pneumonia. Hepatobiliary: No focal abnormality is seen in the liver. There is no dilation of bile ducts. Gallbladder is unremarkable. Small ascites is noted in the perihepatic region. Pancreas: No focal abnormality is seen. Spleen: Unremarkable. Adrenals/Urinary Tract: Adrenals are not enlarged. There is no hydronephrosis. There are no renal or ureteral stones. Urinary bladder is unremarkable. There is 3 mm low-density in the midportion of left kidney which is too small to be characterized. Stomach/Bowel: Stomach is not distended. Small bowel loops are not dilated. Appendix is not seen. There is no significant wall thickening in the colon. There is interval placement of percutaneous drainage catheter with its tip in the right lower abdomen. There is interval decrease in size of the complex fluid collection in the right lower quadrant. There is 4 cm loculated fluid collection slightly to the left of midline at the level of pelvic inlet. There is 7.2 x 5.3 x 4.9 cm loculated fluid collection with minimally enhancing margin in the cul-de-sac in the pelvis. There are no pockets of air within these loculated fluid collections. There is stranding in the fat planes in the lower abdomen and pelvis. Vascular/Lymphatic: Vascular structures are unremarkable. Reproductive: Uterus is to the right of midline. Other: There is no pneumoperitoneum. Musculoskeletal: No acute findings are seen. IMPRESSION: Interval placement of percutaneous drainage catheter within air-fluid collection in the right lower abdomen with interval decrease in size. There is 7.2 cm loculated fluid collection in the  cul-de-sac in the pelvis with slightly enhancing margins. There is another 4 cm loculated fluid collection in the left side of the pelvis. These may suggest loculated serous fluid collections or abscesses. Small ascites is noted in the perihepatic region. There is no evidence of intestinal obstruction or pneumoperitoneum. There is no hydronephrosis. Small bilateral pleural effusions. There are linear patchy infiltrates in both lower lung fields suggesting atelectasis/pneumonia. Electronically Signed   By: Elmer Picker M.D.   On: 02/08/2022 09:41   CT IMAGE GUIDED DRAINAGE BY PERCUTANEOUS CATHETER  Result Date: 02/05/2022 INDICATION: PID with large abdominopelvic fluid collection. EXAM: CT GUIDED DRAINAGE CATHETER PLACEMENT INTO ABDOMINOPELVIC FLUID COLLECTION RADIATION DOSE REDUCTION: This exam was performed according to the departmental dose-optimization program which includes automated exposure control, adjustment of the mA and/or kV according to patient size and/or use of iterative reconstruction technique. COMPARISON:  CT AP, 2022-02-27 MEDICATIONS: The patient is currently admitted to the hospital and receiving intravenous antibiotics. The antibiotics were administered within an appropriate time frame prior to the initiation of the procedure. ANESTHESIA/SEDATION: Moderate (conscious) sedation was employed during this procedure. A total of Versed 3 mg and Fentanyl 75 mcg was administered intravenously. Additionally, 1 mg Dilaudid was administered IV. Moderate Sedation Time: 19 minutes. The patient's level of consciousness and vital signs were monitored continuously by radiology nursing throughout the procedure under my direct supervision. CONTRAST:  None COMPLICATIONS: None immediate. PROCEDURE: Informed written consent was obtained from  the patient and/or patient's representative after a discussion of the risks, benefits and alternatives to treatment. The patient was placed supine on the CT gantry  and a pre procedural CT was performed re-demonstrating the known abscess/fluid collection within the lower abdomen/pelvis. The procedure was planned. A timeout was performed prior to the initiation of the procedure. The RIGHT lower quadrant was prepped and draped in the usual sterile fashion. The overlying soft tissues were anesthetized with 1% lidocaine with epinephrine. Appropriate trajectory was planned with the use of a 22 gauge spinal needle. An 18 gauge trocar needle was advanced into the abscess/fluid collection and a short Amplatz super stiff wire was coiled within the collection. Appropriate positioning was confirmed with a limited CT scan. The tract was serially dilated allowing placement of a 12 [place] all-purpose drainage catheter. Appropriate positioning was confirmed with a limited postprocedural CT scan. 5 mL ml of serous fluid was aspirated and submitted for analysis. The tube was connected to a drainage bag and sutured in place. A dressing was placed. The patient tolerated the procedure well without immediate post procedural complication. IMPRESSION: Successful CT guided placement of a 12 Fr drainage catheter into the abdominopelvic collection with aspiration of serous fluid, as above. Samples were sent to the laboratory as requested by the ordering clinical team. Michaelle Birks, MD [facility] [place] [facility] Radiology Electronically Signed   By: Michaelle Birks M.D.   On: 02/05/2022 18:08   CT ABDOMEN PELVIS WO CONTRAST  Result Date: 02/04/2022 CLINICAL DATA:  Abdominal pain, acute, nonlocalized septic shock, abnormal uterine US, concern for PID EXAM: CT ABDOMEN AND PELVIS WITHOUT CONTRAST TECHNIQUE: Multidetector CT imaging of the abdomen and pelvis was performed following the standard protocol without IV contrast. RADIATION DOSE REDUCTION: This exam was performed according to the departmental dose-optimization program which includes automated exposure control,  adjustment of the mA and/or kV according to patient size and/or use of iterative reconstruction technique. COMPARISON:  Pelvic ultrasound today FINDINGS: Lower chest: Pleural thickening noted peripherally in the right lower hemithorax, possibly representing trace loculated pleural effusion. No confluent airspace opacities. Hepatobiliary: No focal hepatic abnormality. Gallbladder unremarkable. Pancreas: No focal abnormality or ductal dilatation. Spleen: No focal abnormality.  Normal size. Adrenals/Urinary Tract: No adrenal abnormality. No focal renal abnormality. No stones or hydronephrosis. Urinary bladder is unremarkable. Stomach/Bowel: Wall thickening noted in the cecum which is immediately adjacent to the large pelvic fluid collection described below. This could reflect colitis or be secondarily inflamed. Stomach and small bowel grossly unremarkable. Vascular/Lymphatic: No evidence of aneurysm or adenopathy. Reproductive: Large complex cystic area or fluid collection seen in the pelvis extending from the cul-de-sac superiorly into the right lower quadrant adjacent to the cecum. This measures approximately 12 x 8 cm on image 64 of series 3. Internal areas of increased density. This could reflect blood or abscess/PID. Uterus grossly unremarkable on this noncontrast study. Ovaries not definitively visualized. Other: There is free fluid adjacent to the liver and spleen. Musculoskeletal: No acute bony abnormality. IMPRESSION: Complex process in the pelvis including what appears to be a large complex fluid collection measuring 12 x 8 cm extending from the cul-de-sac superiorly to the cecum which could reflect blood products or abscess. This also could conceivably be related to and ovary. Neither ovary definitively identified. Wall thickening in the cecum could reflect colitis or secondary inflammation related to the pelvic process. Perihepatic and perisplenic ascites. Possible trace loculated right pleural effusion  peripherally in the right lower hemithorax. Electronically Signed  By: Rolm Baptise M.D.   On: 02/04/2022 22:29   US PELVIC COMPLETE WITH TRANSVAGINAL  Addendum Date: 02/04/2022   ADDENDUM REPORT: 02/04/2022 13:44 ADDENDUM: In light of negative quantitative beta HCG and no documented pregnancy the possibility of infectious causes for the above findings may be more likely but findings are essentially quite nonspecific at this time. Even noncontrast CT could prove beneficial to allow for assessment of pelvic fluid to determine whether this represents frank hemoperitoneum and to assess for any associated inflammatory changes that may be present not well assessed on ultrasound. These results were called by telephone at the time of interpretation on 02/04/2022 at 1:44 pm to provider [facility] , who verbally acknowledged these results. Electronically Signed   By: Zetta Bills M.D.   On: 02/04/2022 13:44   Result Date: 02/04/2022 CLINICAL DATA:  A 31 year old female presents with pelvic pain and bleeding. Patient reports positive pregnancy but urine pregnancy currently negative by report and beta HCG is currently pending. EXAM: TRANSABDOMINAL AND TRANSVAGINAL ULTRASOUND OF PELVIS TECHNIQUE: Both transabdominal and transvaginal ultrasound examinations of the pelvis were performed. Transabdominal technique was performed for global imaging of the pelvis including uterus, ovaries, adnexal regions, and pelvic cul-de-sac. It was necessary to proceed with endovaginal exam following the transabdominal exam to visualize the endometrium and adnexa. COMPARISON:  No recent imaging is available for comparison. FINDINGS: Uterus Measurements: 12.6 x 8.0 x 8.5 (volume = 450 cc) cm. = volume: 450 mL. Heterogeneous uterus with thickened endometrium. Cystic area in the endometrial canal that measures 4.5 x 2.6 x 4.4 cm. This cystic characteristics and central increased echogenicity. Central increased echogenicity the without  discrete finding that would suggest embryo with very limited assessment due to patient discomfort and clinical condition. Endometrium Thickness: 3.3 cm. Thickened with cystic area in the fundus of the uterus. Right ovary Measurements: 3.2 x 2.7 x 2.1 (volume = 9.5 cc) cm = volume: 9.5 mL. Normal appearance/no adnexal mass. Left ovary Not visible, in the area of the LEFT adnexa is heterogeneous material that has the appearance of blood products but conforms to the cul-de-sac and LEFT adnexa. Other findings Moderate heterogeneous fluid in the LEFT adnexa primarily but also tracking along the RIGHT uterus. IMPRESSION: Unusual cystic area in the endometrial canal. If this patient has any history of recent positive pregnancy test this could represent a failed gestation with retained products of conception. Pseudo gestational sac with areas of internal hemorrhage in the setting of ruptured ectopic pregnancy is also considered. Heterogeneous appearance of material in the cul-de-sac could also potentially represent sequela of pyogenic infection though this is lower on the differential at this time based on appearance. If the patient could tolerate further evaluation with cross-sectional imaging CT could potentially be helpful. Ultimately gyn consultation is suggested. Critical Value/emergent results were called by telephone at the time of interpretation on 02/04/2022 at 11:01 am to provider [facility] , who verbally acknowledged these results. Electronically Signed: By: Zetta Bills M.D. On: 02/04/2022 11:01   DG Chest Port 1 View  Result Date: 02/04/2022 CLINICAL DATA:  31 year old female with history of lower abdominal pain and vaginal bleeding 3 days ago. Fever. Low blood pressure. EXAM: PORTABLE CHEST 1 VIEW COMPARISON:  Chest x-ray 08/09/2021. FINDINGS: Lung volumes are slightly low. No consolidative airspace disease. No pleural effusions. No pneumothorax. No evidence of pulmonary edema. Heart size is normal.  The patient is rotated to the right on today's exam, resulting in distortion of the mediastinal contours  and reduced diagnostic sensitivity and specificity for mediastinal pathology. IMPRESSION: 1. No radiographic evidence of acute cardiopulmonary disease. Electronically Signed   By: Vinnie Langton M.D.   On: 02/04/2022 10:55     Discharge Instructions:   Signed: Virl Axe, MD 02/28/2022, 2:52 PM   Pager: [phone]

## 2022-02-28 NOTE — Plan of Care (Signed)
  Problem: Education: Goal: Knowledge of General Education information will improve Description: Including pain rating scale, medication(s)/side effects and non-pharmacologic comfort measures Outcome: Adequate for Discharge   Problem: Health Behavior/Discharge Planning: Goal: Ability to manage health-related needs will improve Outcome: Adequate for Discharge   Problem: Clinical Measurements: Goal: Ability to maintain clinical measurements within normal limits will improve Outcome: Adequate for Discharge Goal: Will remain free from infection Outcome: Adequate for Discharge Goal: Diagnostic test results will improve Outcome: Adequate for Discharge Goal: Cardiovascular complication will be avoided Outcome: Adequate for Discharge   Problem: Activity: Goal: Risk for activity intolerance will decrease Outcome: Adequate for Discharge   Problem: Nutrition: Goal: Adequate nutrition will be maintained Outcome: Adequate for Discharge   Problem: Coping: Goal: Level of anxiety will decrease Outcome: Adequate for Discharge   Problem: Elimination: Goal: Will not experience complications related to bowel motility Outcome: Adequate for Discharge   Problem: Pain Managment: Goal: General experience of comfort will improve Outcome: Adequate for Discharge   Problem: Safety: Goal: Ability to remain free from injury will improve Outcome: Adequate for Discharge   Problem: Skin Integrity: Goal: Risk for impaired skin integrity will decrease Outcome: Adequate for Discharge   Problem: Fluid Volume: Goal: Hemodynamic stability will improve Outcome: Adequate for Discharge   Problem: Clinical Measurements: Goal: Diagnostic test results will improve Outcome: Adequate for Discharge Goal: Signs and symptoms of infection will decrease Outcome: Adequate for Discharge   Problem: Clinical Measurements: Goal: Respiratory complications will improve Outcome: Adequate for Discharge   Problem:  Elimination: Goal: Will not experience complications related to urinary retention Outcome: Adequate for Discharge   Problem: Education: Goal: Knowledge of General Education information will improve Description: Including pain rating scale, medication(s)/side effects and non-pharmacologic comfort measures Outcome: Adequate for Discharge   Problem: Health Behavior/Discharge Planning: Goal: Ability to manage health-related needs will improve Outcome: Adequate for Discharge   Problem: Clinical Measurements: Goal: Ability to maintain clinical measurements within normal limits will improve Outcome: Adequate for Discharge Goal: Will remain free from infection Outcome: Adequate for Discharge Goal: Diagnostic test results will improve Outcome: Adequate for Discharge Goal: Respiratory complications will improve Outcome: Adequate for Discharge Goal: Cardiovascular complication will be avoided Outcome: Adequate for Discharge   Problem: Activity: Goal: Risk for activity intolerance will decrease Outcome: Adequate for Discharge   Problem: Nutrition: Goal: Adequate nutrition will be maintained Outcome: Adequate for Discharge   Problem: Coping: Goal: Level of anxiety will decrease Outcome: Adequate for Discharge   Problem: Elimination: Goal: Will not experience complications related to bowel motility Outcome: Adequate for Discharge Goal: Will not experience complications related to urinary retention Outcome: Adequate for Discharge   Problem: Pain Managment: Goal: General experience of comfort will improve Outcome: Adequate for Discharge   Problem: Safety: Goal: Ability to remain free from injury will improve Outcome: Adequate for Discharge   Problem: Skin Integrity: Goal: Risk for impaired skin integrity will decrease Outcome: Adequate for Discharge

## 2022-02-28 NOTE — Progress Notes (Addendum)
Inpatient Rehab Admissions Coordinator:   I have a CIR bed for this Pt. And can admit her today. RN may call report [phone].  Megan Salon, MS, CCC-SLP Rehab Admissions Coordinator  [phone] (celll) [phone] (office)

## 2022-02-28 NOTE — Progress Notes (Signed)
Report called to Tameka, RN in CIR - pt to transfer to [id number].

## 2022-02-28 NOTE — Progress Notes (Signed)
Patient arrived to unit with nurse and tech. Patient was orientated to rehab unit and rehab program. Patient has no questions at this time. Patient was given menu by staff to order their dinner and breakfast for the next day and they stated that they do not eat that. Patient was asked to look at back of menu to see if any of the items from the everyday menu would be something that they liked. Patient instructed to call whenever ready to order. Cletis Media, RN

## 2022-02-28 NOTE — TOC Transition Note (Signed)
Transition of Care [facility]) - CM/SW Discharge Note   Patient Details  Name: Paula Lucas MRN: [id number] Date of Birth: October 02, 1990  Transition of Care [facility]) CM/SW Contact:  Kermit Balo, RN Phone Number: 02/28/2022, 1:45 PM   Clinical Narrative:    Pt is discharging to CIR today. CM signing off.    Final next level of care: IP Rehab Facility Barriers to Discharge: No Barriers Identified   Patient Goals and CMS Choice Patient states their goals for this hospitalization and ongoing recovery are:: return home   Choice offered to / list presented to : Patient  Discharge Placement                       Discharge Plan and Services   Discharge Planning Services: CM Consult            DME Arranged: N/A         HH Arranged: RN HH Agency: [facility] Date [facility] Agency Contacted: 02/10/22 Time HH Agency Contacted: [phone] Representative spoke with at [facility] Agency: Kandee Keen  Social Determinants of Health (SDOH) Interventions     Readmission Risk Interventions     No data to display

## 2022-02-28 NOTE — Progress Notes (Signed)
Patient refused her routine meds (Flagyl,Miralax,Senokot-s,Boost).Informed to MD.

## 2022-02-28 NOTE — Progress Notes (Signed)
Physical Therapy Treatment Patient Details Name: Paula Lucas MRN: [id number] DOB: 07/14/1991 Today's Date: 02/28/2022   History of Present Illness Pt is a 31 y/o female admitted 6/19 secondary to worsening abdominal pain. Pt left AMA on 6/18 from ICU for pelvic abscess and had drain placement during that admission. Pt reports she was [redacted] weeks pregnant at that time. Found to have tuboovarian abscess and pelvic abscess. Pt is s/p second drain placement and exchange and repositioning of first drain. Cortrak placed 6/30. Abdominal wound vac placed 7/4. Cortrak removed 7/6. PMH includes STI, tubo-ovarian abscess, PID.    PT Comments    Patient feeling better overall and able to progress to ambulation in the hall. Requires use of RW for stability due to generalized weakness. Some goals met and all goals updated based on timeframe.    Recommendations for follow up therapy are one component of a multi-disciplinary discharge planning process, led by the attending physician.  Recommendations may be updated based on patient status, additional functional criteria and insurance authorization.  Follow Up Recommendations  Home health PT (pending progress)     Assistance Recommended at Discharge Intermittent Supervision/Assistance  Patient can return home with the following Help with stairs or ramp for entrance;Assist for transportation;Assistance with cooking/housework;A little help with bathing/dressing/bathroom;A little help with walking and/or transfers   Equipment Recommendations  Rolling walker (2 wheels);BSC/3in1    Recommendations for Other Services       Precautions / Restrictions Precautions Precautions: Fall Precaution Comments: Contact;  abd JP x2 drains, wound vac; watch HR/O2 Restrictions Weight Bearing Restrictions: No     Mobility  Bed Mobility Overal bed mobility: Needs Assistance Bed Mobility: Rolling, Sidelying to Sit, Sit to Sidelying Rolling: Supervision Sidelying to  sit: Supervision     Sit to sidelying: Min assist General bed mobility comments: assist to raise legs up onto bed    Transfers Overall transfer level: Needs assistance Equipment used: Rolling walker (2 wheels) Transfers: Sit to/from Stand Sit to Stand: Min guard           General transfer comment: vc for hand placement    Ambulation/Gait Ambulation/Gait assistance: Min guard, +2 safety/equipment Gait Distance (Feet): 175 Feet (standing rest; 175) Assistive device: Rolling walker (2 wheels) Gait Pattern/deviations: Step-through pattern, Trunk flexed Gait velocity: Decreased     General Gait Details: no imbalance noted with use of RW; pt did not want to attempt without device   Stairs             Wheelchair Mobility    Modified Rankin (Stroke Patients Only)       Balance Overall balance assessment: Needs assistance Sitting-balance support: No upper extremity supported, Feet supported Sitting balance-Leahy Scale: Fair Sitting balance - Comments: Supervision for static sitting   Standing balance support: Single extremity supported Standing balance-Leahy Scale: Poor Standing balance comment: reliant on at least one hand support                            Cognition Arousal/Alertness: Awake/alert Behavior During Therapy: Flat affect                                   General Comments: good awareness of lines and patiently waiting for them to be arranged        Exercises      General Comments  Pertinent Vitals/Pain Pain Assessment Pain Assessment: Faces Faces Pain Scale: Hurts a little bit Pain Location: abdomen Pain Descriptors / Indicators: Constant, Guarding Pain Intervention(s): Limited activity within patient's tolerance    Home Living                          Prior Function            PT Goals (current goals can now be found in the care plan section) Acute Rehab PT Goals Patient Stated  Goal: short term: to decrease pain, to ambulate in hallway PT Goal Formulation: With patient Time For Goal Achievement: 03/14/22 Potential to Achieve Goals: Good Progress towards PT goals: Goals met and updated - see care plan    Frequency    Min 3X/week      PT Plan Current plan remains appropriate    Co-evaluation              AM-PAC PT "6 Clicks" Mobility   Outcome Measure  Help needed turning from your back to your side while in a flat bed without using bedrails?: A Little Help needed moving from lying on your back to sitting on the side of a flat bed without using bedrails?: A Little Help needed moving to and from a bed to a chair (including a wheelchair)?: A Little Help needed standing up from a chair using your arms (e.g., wheelchair or bedside chair)?: A Little Help needed to walk in hospital room?: A Little (refusing today but did perform <12 hours prior with nsg staff) Help needed climbing 3-5 steps with a railing? : Total 6 Click Score: 16    End of Session   Activity Tolerance: Patient tolerated treatment well Patient left: in bed;with call bell/phone within reach;with bed alarm set (heels floated) Nurse Communication: Mobility status PT Visit Diagnosis: Other abnormalities of gait and mobility (R26.89);Pain;Muscle weakness (generalized) (M62.81);Difficulty in walking, not elsewhere classified (R26.2) Pain - Right/Left: Right Pain - part of body:  (ribcage/"lung" and surgical site at abd)     Time: 1601-0932 PT Time Calculation (min) (ACUTE ONLY): 21 min  Charges:  $Gait Training: 8-22 mins                      [place]  Office [phone]    Rexanne Mano 02/28/2022, 12:11 PM

## 2022-02-28 NOTE — Discharge Summary (Signed)
  The note originally documented on this encounter has been moved the the encounter in which it belongs.  

## 2022-02-28 NOTE — Progress Notes (Signed)
Patient refused for V/S to be taken,as informed by NT(Karen Odis Luster)

## 2022-02-28 NOTE — Progress Notes (Signed)
PMR Admission Coordinator Pre-Admission Assessment   Patient: Paula Lucas is an 31 y.o., female MRN: [id number] DOB: [birth year] Height: 5' 3" (160 cm) Weight: 76.8 kg   Insurance Information HMO:     PPO:      PCP:      IPA:      80/20:      OTHER:  PRIMARY: Medicaid of [place]       Policy#: [id number]      Subscriber: Pt  CM Name:       Phone#:      Fax#:  Pre-Cert#:       Employer:  Benefits:  Phone #:      Name:  Eff. Date: Checked 02/27/22. Pt. Eligible. Code [id number]     Deduct:       Out of Pocket Max:       Life Max:  CIR:       SNF:  Outpatient:      Co-Pay:  Home Health:       Co-Pay:  DME:      Co-Pay:  Providers:  SECONDARY:       Policy#:      Phone#:    Development worker, community:       Phone#:    The Engineer, [employer]" for patients in Inpatient Rehabilitation Facilities with attached "Privacy Act [place] Records" was provided and verbally reviewed with: Patient   Emergency Contact Information Contact Information       Name Relation Home Work [place] Mother     [phone]    Paula Lucas Sister     [phone]           Current Medical History  Patient Admitting Diagnosis: Pelvic Abscesses, Debility  History of Present Illness: Paula DEAL is a 31 y.o. female with PMH including chlamydia, gonorrhea, tubo-ovarian abscess, asthma, and HTN. She was initially admitted 02/04/22-02/07/22 after presenting on 6/16 for drain placement secondary to a complex fluid collection in the pelvis.She left AMA on 6/18, then returned to the ED 02/08/22 Follow-up CT on 6/19 revealed a 7.2cm loculated fluid collection. arrival to the ED, patient was tachycardic up to 117 with a blood pressure of 119/69.  She was saturating at 95% on room air.  CBC demonstrates leukocytosis at 16.9, hemoglobin of 10.1 and platelets of 518.  CMP remarkable for potassium of 3.3.  CT abdomen/pelvis was obtained that demonstrated percutaneous drain within fluid  collection in the right lower abdomen with interval decrease in size.  There is a new 7.2 cm loculated fluid collection in the cul-de-sac in addition to another 4 cm loculated fluid collection in the left side of the pelvis.  There is mild ascites in the perihepatic region.  In addition small bilateral pleural effusions and findings consistent with atelectasis versus pneumonia.   Patient was started on Cefepime, Metronidazole and Doxycycline. IR and OB/GYN were consulted. On 6/27, pt underwent ex lap, abdominal washout with drainage of abscesses, repair SB serosa X 3,  Coretrak placed 6/30 due to poor intake. ID was consulted. cultures taken in OR (NGTD), currently rocephin/ flagyl/doxycycline/ vancomycin. CIR was consulted to assist return to PLOF.   WOrk up for tachycardia, EKG NSR, ECHO normal    Patient's medical record from [facility] has been reviewed by the rehabilitation admission coordinator and physician.   Past Medical History      Past Medical History:  Diagnosis Date   Asthma     BMI 31.0-31.9,adult  Chlamydia     Gonorrhea     TOA (tubo-ovarian abscess)        Has the patient had major surgery during 100 days prior to admission? Yes   Family History   family history includes Diabetes in her father, mother, and sister; Hypertension in her father, mother, and sister.   Current Medications   Current Facility-Administered Medications:    bisacodyl (DULCOLAX) suppository 10 mg, 10 mg, Rectal, Daily PRN, Donnamae Jude, MD   cefTRIAXone (ROCEPHIN) 2 g in sodium chloride 0.9 % 100 mL IVPB, 2 g, Intravenous, Q24H, Vu, Trung T, MD, Last Rate: 200 mL/hr at 02/26/22 2113, 2 g at 02/26/22 2113   Chlorhexidine Gluconate Cloth 2 % PADS 6 each, 6 each, Topical, Daily, Lottie Mussel, MD, 6 each at 02/27/22 0600   cyclobenzaprine (FLEXERIL) tablet 5 mg, 5 mg, Oral, TID, Virl Axe, MD   [DISCONTINUED] diphenhydrAMINE (BENADRYL) injection 12.5 mg, 12.5 mg,  Intravenous, Q6H PRN, 12.5 mg at 02/20/22 2349 **OR** diphenhydrAMINE (BENADRYL) 12.5 MG/5ML elixir 12.5 mg, 12.5 mg, Oral, Q6H PRN, Donnamae Jude, MD   doxycycline (VIBRA-TABS) tablet 100 mg, 100 mg, Oral, Q12H, Vu, Trung T, MD, 100 mg at 02/26/22 2121   DULoxetine (CYMBALTA) DR capsule 60 mg, 60 mg, Oral, Daily, Virl Axe, MD   enoxaparin (LOVENOX) injection 40 mg, 40 mg, Subcutaneous, Q24H, Donnamae Jude, MD, 40 mg at 02/26/22 1212   feeding supplement (BOOST / RESOURCE BREEZE) liquid 1 Container, 1 Container, Oral, TID BM, Charise Killian, MD, 1 Container at 02/25/22 1511   feeding supplement (OSMOLITE 1.5 CAL) liquid 1,000 mL, 1,000 mL, Per Tube, Continuous, Lottie Mussel, MD, Stopped at 02/25/22 1505   feeding supplement (PROSource TF) liquid 45 mL, 45 mL, Per Tube, BID, Lottie Mussel, MD, 45 mL at 02/25/22 1023   fentaNYL (DURAGESIC) 25 MCG/HR 1 patch, 1 patch, Transdermal, Q72H, Virl Axe, MD, 1 patch at 02/26/22 1214   gabapentin (NEURONTIN) capsule 300 mg, 300 mg, Oral, TID, France Ravens, MD, 300 mg at 02/26/22 2120   hydrOXYzine (ATARAX) tablet 25 mg, 25 mg, Oral, TID PRN, Donnamae Jude, MD, 25 mg at 02/24/22 0516   lidocaine (LIDODERM) 5 % 1 patch, 1 patch, Transdermal, Daily, Delene Ruffini, MD, 1 patch at 02/26/22 1211   lip balm (CARMEX) ointment, , Topical, PRN, Jose Persia, MD, 75 Application at 81/82/99 2209   menthol-cetylpyridinium (CEPACOL) lozenge 3 mg, 1 lozenge, Oral, Q2H PRN, Donnamae Jude, MD, 3 mg at 02/21/22 0853   metroNIDAZOLE (FLAGYL) tablet 500 mg, 500 mg, Oral, Q12H, Charise Killian, MD, 500 mg at 02/26/22 2119   multivitamin with minerals tablet 1 tablet, 1 tablet, Oral, Daily, Charise Killian, MD, 1 tablet at 02/25/22 1511   nicotine (NICODERM CQ - dosed in mg/24 hours) patch 21 mg, 21 mg, Transdermal, Daily, Donnamae Jude, MD, 21 mg at 02/26/22 0918   ondansetron (ZOFRAN) tablet 4 mg, 4 mg, Oral, Q6H PRN, 4 mg at 02/13/22 0032 **OR** ondansetron (ZOFRAN)  injection 4 mg, 4 mg, Intravenous, Q6H PRN, Donnamae Jude, MD, 4 mg at 02/27/22 3716   oxyCODONE (Oxy IR/ROXICODONE) immediate release tablet 5 mg, 5 mg, Oral, Q8H PRN, Virl Axe, MD, 5 mg at 02/27/22 0110   pantoprazole (PROTONIX) EC tablet 40 mg, 40 mg, Oral, Daily, Charise Killian, MD, 40 mg at 02/25/22 1022   phenol (CHLORASEPTIC) mouth spray 1 spray, 1 spray, Mouth/Throat, PRN, Sim[place], Elizabeth S, PA-C   polyethylene glycol (MIRALAX / GLYCOLAX) packet 17  g, 17 g, Oral, BID, Charise Killian, MD   polyvinyl alcohol (LIQUIFILM TEARS) 1.4 % ophthalmic solution 1 drop, 1 drop, Both Eyes, PRN, Donnamae Jude, MD   prochlorperazine (COMPAZINE) injection 10 mg, 10 mg, Intravenous, Q6H PRN, Meuth, Brooke A, PA-C, 10 mg at 02/27/22 0318   senna-docusate (Senokot-S) tablet 1 tablet, 1 tablet, Oral, QHS, Virl Axe, MD, 1 tablet at 02/23/22 2231   simethicone (MYLICON) chewable tablet 80 mg, 80 mg, Oral, Q6H PRN, Charise Killian, MD   sodium chloride flush (NS) 0.9 % injection 10-40 mL, 10-40 mL, Intracatheter, Q12H, Lottie Mussel, MD, 10 mL at 02/27/22 0515   sodium chloride flush (NS) 0.9 % injection 10-40 mL, 10-40 mL, Intracatheter, PRN, Lottie Mussel, MD, 10 mL at 02/24/22 0531   traZODone (DESYREL) tablet 50 mg, 50 mg, Oral, QHS, Charise Killian, MD, 50 mg at 02/26/22 2120   Patients Current Diet:  Diet Order                  Diet regular Room service appropriate? Yes; Fluid consistency: Thin  Diet effective now                         Precautions / Restrictions Precautions Precautions: Fall Precaution Comments: Contact;  abd JP x2 drains, wound vac; watch HR/O2 Restrictions Weight Bearing Restrictions: No    Has the patient had 2 or more falls or a fall with injury in the past year? No   Prior Activity Level Community (5-7x/wk): pt active in the community PTA   Prior Functional Level Self Care: Did the patient need help bathing, dressing, using the toilet or eating? Independent    Indoor Mobility: Did the patient need assistance with walking from room to room (with or without device)? Independent   Stairs: Did the patient need assistance with internal or external stairs (with or without device)? Independent   Functional Cognition: Did the patient need help planning regular tasks such as shopping or remembering to take medications? Independent   Patient Information Are you of Hispanic, Latino/a,or Spanish origin?: A. No, not of Hispanic, Latino/a, or Spanish origin What is your race?: B. Black or African American Do you need or want an interpreter to communicate with a doctor or health care staff?: 0. No   Patient's Response To:  Health Literacy and Transportation Is the patient able to respond to health literacy and transportation needs?: Yes Health Literacy - How often do you need to have someone help you when you read instructions, pamphlets, or other written material from your doctor or pharmacy?: Never In the past 12 months, has lack of transportation kept you from medical appointments or from getting medications?: Yes In the past 12 months, has lack of transportation kept you from meetings, work, or from getting things needed for daily living?: Yes   Home Assistive Devices / [place] Devices/Equipment: None   Prior Device Use: Indicate devices/aids used by the patient prior to current illness, exacerbation or injury? None of the above   Current Functional Level Cognition   Overall Cognitive Status: Impaired/Different from baseline Orientation Level: Oriented X4 Safety/Judgement: Decreased awareness of safety General Comments: decreased awareness of lines, impulsive    Extremity Assessment (includes Sensation/Coordination)   Upper Extremity Assessment: Generalized weakness  Lower Extremity Assessment: Defer to PT evaluation     ADLs   Overall ADL's : Needs assistance/impaired Eating/Feeding: Set up, Sitting Grooming: Wash/dry hands,  Sitting, Supervision/safety Upper Body Bathing: Moderate  assistance, Cueing for safety, Cueing for sequencing, Bed level Lower Body Bathing: Total assistance, Bed level Upper Body Dressing : Minimal assistance, Bed level Lower Body Dressing: Maximal assistance, Sit to/from stand Toilet Transfer: Minimal assistance, Stand-pivot, BSC/3in1 Toileting- Clothing Manipulation and Hygiene: Maximal assistance, Sit to/from stand Toileting - Clothing Manipulation Details (indicate cue type and reason): posterior pericare Functional mobility during ADLs: Min guard General ADL Comments: Pt very limited in any mobility at this time and did not make it to OOB actvity     Mobility   Overal bed mobility: Needs Assistance Bed Mobility: Rolling, Sidelying to Sit, Sit to Sidelying Rolling: Min assist Sidelying to sit: Min assist Supine to sit: Min assist Sit to supine: Supervision Sit to sidelying: Min assist General bed mobility comments: cues for log roll technique, assist to raise trunk and for LEs back into bed, increased time     Transfers   Overall transfer level: Needs assistance Equipment used: 1 person hand held assist Transfers: Sit to/from Stand, Bed to chair/wheelchair/BSC Sit to Stand: Min assist Bed to/from chair/wheelchair/BSC transfer type:: Stand pivot Stand pivot transfers: Min assist Step pivot transfers: Min assist General transfer comment: assist to rise and steady     Ambulation / Gait / Stairs / Emergency planning/management officer   Ambulation/Gait Ambulation/Gait assistance: Counsellor (Feet): 20 Feet (78f to bathroom toilet, seated break ~10 mins, then 283fin room, then ~[id number] [facility]: Rolling walker (2 wheels) Gait Pattern/deviations: Step-through pattern, Trunk flexed General Gait Details: pt refusing today, c/o nausea Gait velocity: Decreased Pre-gait activities: standing hip flexion x10 reps at RW, min guard for safety     Posture / Balance Dynamic  Sitting Balance Sitting balance - Comments: Supervision for static sitting Balance Overall balance assessment: Needs assistance Sitting-balance support: No upper extremity supported, Feet supported Sitting balance-Leahy Scale: Fair Sitting balance - Comments: Supervision for static sitting Standing balance support: Single extremity supported Standing balance-Leahy Scale: Poor Standing balance comment: reliant on at least one hand support     Special needs/care consideration Wound Vac abdominal  and Skin surgical incision2    Previous Home Environment (from acute therapy documentation) Living Arrangements: Alone  Lives With: Alone Available Help at Discharge: Available 24 hours/day Type of Home: Other(Comment) (Pt. living alone in hotel PTA) Home Access: Level entry Bathroom Shower/Tub: TubChiropodisttandard Bathroom Accessibility: No Home Care Services: No Additional Comments: Pt reports she plans to go to her mothers home, but per CSW, mother is not planning for pt to return home with her.   Discharge Living Setting Plans for Discharge Living Setting: Apartment Type of Home at Discharge: Apartment Discharge Home Layout: Two level Alternate Level Stairs-Rails: Can reach both, Left, Right Alternate Level Stairs-Number of Steps: 14 Discharge Home Access: Level entry Entrance Stairs-Rails: None Discharge Bathroom Shower/Tub: Tub/shower unit Discharge Bathroom Toilet: Standard Discharge Bathroom Accessibility: Yes How Accessible: Accessible via walker Does the patient have any problems obtaining your medications?: No   Social/Family/Support Systems Patient Roles: Parent Contact Information: [phone] Caregiver: JulBlanchie Serveility/Limitations of Caregiver: Supervision only Caregiver Availability: 24/7 Discharge Plan Discussed with Primary Caregiver: Yes Is Caregiver In Agreement with Plan?: No Does Caregiver/Family have Issues with  Lodging/Transportation while Pt is in Rehab?: No   Goals Patient/Family Goal for Rehab: PT/OT Mod I Expected length of stay: 7-10 days Pt/Family Agrees to Admission and willing to participate: Yes Program Orientation Provided & Reviewed with Pt/Caregiver Including Roles  & Responsibilities: Yes  Decrease burden of Care through IP rehab admission: n/a   Possible need for SNF placement upon discharge: not anticipated   Patient Condition: I have reviewed medical records from [facility], spoken with CM, and patient. I met with patient at the bedside for inpatient rehabilitation assessment.  Patient will benefit from ongoing PT and OT, can actively participate in 3 hours of therapy a day 5 days of the week, and can make measurable gains during the admission.  Patient will also benefit from the coordinated team approach during an Inpatient Acute Rehabilitation admission.  The patient will receive intensive therapy as well as Rehabilitation physician, nursing, social worker, and care management interventions.  Due to safety, skin/wound care, disease management, medication administration, pain management, and patient education the patient requires 24 hour a day rehabilitation nursing.  The patient is currently min A with mobility and basic ADLs.  Discharge setting and therapy post discharge at home with home health is anticipated.  Patient has agreed to participate in the Acute Inpatient Rehabilitation Program and will admit today.   Preadmission Screen Completed By:  Genella Mech, 02/27/2022 12:21 PM ______________________________________________________________________   Discussed status with Dr. Letta Pate  on 02/28/22 at 50 and received approval for admission today.   Admission Coordinator:  Genella Mech, CCC-SLP, time 1100/Date 02/28/22    Assessment/Plan: Diagnosis:  Debility after sepsis Does the need for close, 24 hr/day Medical supervision in concert with the patient's rehab  needs make it unreasonable for this patient to be served in a less intensive setting? Yes Co-Morbidities requiring supervision/potential complications: wound care from pelvic infection, management of pelvic drains  Due to bladder management, bowel management, safety, skin/wound care, disease management, medication administration, pain management, and patient education, does the patient require 24 hr/day rehab nursing? Yes Does the patient require coordinated care of a physician, rehab nurse, PT, OT, and SLP to address physical and functional deficits in the context of the above medical diagnosis(es)? Yes Addressing deficits in the following areas: balance, endurance, locomotion, strength, transferring, bowel/bladder control, bathing, dressing, feeding, grooming, toileting, and psychosocial support Can the patient actively participate in an intensive therapy program of at least 3 hrs of therapy 5 days a week? Yes The potential for patient to make measurable gains while on inpatient rehab is good Anticipated functional outcomes upon discharge from inpatient rehab: modified independent PT, modified independent OT, n/a SLP Estimated rehab length of stay to reach the above functional goals is: 7-10d Anticipated discharge destination: Home 10. Overall Rehab/Functional Prognosis: excellent     MD Signature: Charlett Blake M.D. [place] [facility] Fellow [employer]

## 2022-02-28 NOTE — H&P (Signed)
Physical Medicine and Rehabilitation Admission H&P    No chief complaint on file. : HPI: 31 year old female admitted with sepsis from left tubo-ovarian abscess and pyometrium.  Prior history of gonorrhea, chlamydia in 2019.  Was seen in the ED at [facility] on 02/04/2022.  Patient had vaginal bleeding pain and fever treated with Zosyn Septra trioxsalen doxycycline Flagyl and transferred to the ICU.  Initially was also complaining of bilateral lower extremity numbness.  Neurology consultation was obtained and symptoms were nonphysiologic.  A drainage catheter was placed by IR into the complex fluid collection that was initially 12 x 8 cm.  Was evaluated by OB/GYN but no surgery was recommended.  The patient checked out AMA on Father's Day to visit her father's grave and then return to the hospital.  A new drain was placed.  General surgery was consulted on 02/15/2022.  CT of the abdomen showed perihepatic abscess.  Patient did go to the OR with OB/GYN, wound care was consulted postoperatively for wound VAC placement lower quadrant abdominal surgical incision midline.  Patient refused the wound VAC.  Wound care was consulted again on 02/23/2022 when patient agreed to have wound VAC placed.  Wound care has been following Monday Wednesdays Fridays.  Initial measurement of her sponge was 8 x 5 x 4 cm, most recent 8 x 4 x 3 cm.  Last PT note on 02/28/2022.  Ambulating min guard 175 feet. The patient did have a fever of 100 point 7 in the evening of 02/27/2022.  Medicine reevaluated patient, no need for change in antibiotic treatment.  Subsequent vitals have not had any fevers. White count on 02/28/2022 was 7.9, hemoglobin 9.4 which is up from prior values. Echocardiogram performed to evaluate for tachycardia.  This was normal.  Her heart rates have remained in the 100 to 120 bpm range.  Review of prior records dating back to 2019 office visits show her to run 102 at that time The patient had migraine headaches  treated with Compazine as well as IV Benadryl.  She has no contraindication to triptans.  She has been on fentanyl patch 25 mcg/h as well as Oxy IR 5 mg for breakthrough  Review of Systems  Constitutional:  Negative for chills, fever and weight loss.  HENT:  Negative for nosebleeds.   Eyes:  Negative for discharge and redness.  Respiratory:  Negative for cough, sputum production, shortness of breath, wheezing and stridor.   Cardiovascular:  Negative for chest pain, palpitations and leg swelling.  Gastrointestinal:  Positive for abdominal pain. Negative for nausea and vomiting.  Genitourinary:  Negative for dysuria and hematuria.  Musculoskeletal:  Negative for falls.  Skin:  Negative for itching and rash.  Neurological:  Positive for weakness and headaches.  Endo/Heme/Allergies:  Does not bruise/bleed easily.  Psychiatric/Behavioral:  Positive for depression.    Past Medical History:  Diagnosis Date   Asthma    BMI 31.0-31.9,adult    Chlamydia    Gonorrhea    TOA (tubo-ovarian abscess)    Past Surgical History:  Procedure Laterality Date   INDUCED ABORTION     IR RADIOLOGIST EVAL & MGMT  05/17/2018   LAPAROTOMY N/A 02/16/2022   Procedure: EXPLORATORY LAPAROTOMY WITH ABDOMINAL WASHOUT;  Surgeon: Reva Bores, MD;  Location[facility] OR;  Service: Gynecology;  Laterality: N/A;   SMALL BOWEL REPAIR N/A 02/16/2022   Procedure: SMALL BOWEL REPAIR;  Surgeon: Violeta Gelinas, MD;  Location: [facility] OR;  Service: General;  Laterality: N/A;  TONSILLECTOMY     Family History  Problem Relation Age of Onset   Diabetes Mother    Hypertension Mother    Diabetes Father    Hypertension Father    Diabetes Sister    Hypertension Sister    Social History:  reports that she has been smoking cigarettes. She has a 4.50 pack-year smoking history. She has never used smokeless tobacco. She reports current drug use. Drug: Marijuana. She reports that she does not drink alcohol. Allergies:  Allergies  Allergen  Reactions   Fish Allergy Anaphylaxis    Severe allergic reaction to all seafoods   Motrin [Ibuprofen] Anaphylaxis    Pt tolerated Ketorolac 04/02/18   Mushroom Extract Complex Anaphylaxis   Shellfish-Derived Products Anaphylaxis   Tylenol [Acetaminophen] Anaphylaxis   Medications Prior to Admission  Medication Sig Dispense Refill   doxycycline (VIBRAMYCIN) 100 MG capsule Take 1 capsule (100 mg total) by mouth 2 (two) times daily. (Patient not taking: Reported on 02/04/2022) 20 capsule 0   hydrOXYzine (ATARAX/VISTARIL) 25 MG tablet Take 1 tablet (25 mg total) by mouth every 6 (six) hours. (Patient not taking: Reported on 02/04/2022) 12 tablet 0   methylPREDNISolone (MEDROL DOSEPAK) 4 MG TBPK tablet Follow package insert (Patient not taking: Reported on 02/04/2022) 21 each 0   penicillin v potassium (VEETID) 500 MG tablet Take 1 tablet (500 mg total) by mouth 3 (three) times daily. (Patient not taking: Reported on 02/04/2022) 30 tablet 0   permethrin (ELIMITE) 5 % cream Apply to affected area once (Patient not taking: Reported on 02/04/2022) 60 g 0   traMADol (ULTRAM) 50 MG tablet Take 1 tablet (50 mg total) by mouth every 6 (six) hours as needed. (Patient not taking: Reported on 02/04/2022) 10 tablet 0      Home:     Functional History:    Functional Status:  Mobility:          ADL:    Cognition:      Physical Exam: Last menstrual period 10/26/2021, unknown if currently breastfeeding. Physical Exam Vitals and nursing note reviewed.  Constitutional:      Appearance: She is normal weight.  HENT:     Head: Normocephalic and atraumatic.  Eyes:     Extraocular Movements: Extraocular movements intact.     Conjunctiva/sclera: Conjunctivae normal.     Pupils: Pupils are equal, round, and reactive to light.  Cardiovascular:     Rate and Rhythm: Normal rate and regular rhythm.     Heart sounds: Normal heart sounds.  Pulmonary:     Effort: Pulmonary effort is normal. No  respiratory distress.     Breath sounds: Normal breath sounds. No stridor.  Abdominal:     General: Bowel sounds are normal. There is distension.     Tenderness: There is abdominal tenderness.  Musculoskeletal:     Cervical back: Normal range of motion.     Comments: No pain with upper extremity or lower extremity range of motion  Skin:    Comments: Right lower quadrant drains x2, midline wound VAC, no surrounding erythema PICC line right upper arm nontender no erythema  Neurological:     Mental Status: She is alert and oriented to person, place, and time.     Cranial Nerves: Cranial nerves 2-12 are intact. No dysarthria.     Comments: Motor strength is 4/5 bilateral hip flexor knee extensor ankle dorsiflexor 5/5 bilateral deltoid bicep tricep grip Sensation intact light touch bilateral upper and lower limbs Negative straight leg raising  Results for orders placed or performed during the hospital encounter of 02/08/22 (from the past 48 hour(s))  CBC with Differential/Platelet     Status: Abnormal   Collection Time: 02/27/22  5:07 AM  Result Value Ref Range   WBC 9.8 4.0 - 10.5 K/uL   RBC 3.12 (L) 3.87 - 5.11 MIL/uL   Hemoglobin 9.0 (L) 12.0 - 15.0 g/dL   HCT 75.6 (L) 43.3 - 29.5 %   MCV 91.3 80.0 - 100.0 fL   MCH 28.8 26.0 - 34.0 pg   MCHC 31.6 30.0 - 36.0 g/dL   RDW 18.8 (H) 41.6 - 60.6 %   Platelets 496 (H) 150 - 400 K/uL   nRBC 0.0 0.0 - 0.2 %   Neutrophils Relative % 57 %   Neutro Abs 5.7 1.7 - 7.7 K/uL   Lymphocytes Relative 30 %   Lymphs Abs 2.9 0.7 - 4.0 K/uL   Monocytes Relative 8 %   Monocytes Absolute 0.8 0.1 - 1.0 K/uL   Eosinophils Relative 3 %   Eosinophils Absolute 0.3 0.0 - 0.5 K/uL   Basophils Relative 1 %   Basophils Absolute 0.1 0.0 - 0.1 K/uL   Immature Granulocytes 1 %   Abs Immature Granulocytes 0.05 0.00 - 0.07 K/uL    Comment: Performed at [facility], [address] [place], [place] [address]  Glucose, capillary     Status: None    Collection Time: 02/27/22  6:43 AM  Result Value Ref Range   Glucose-Capillary 89 70 - 99 mg/dL    Comment: Glucose reference range applies only to samples taken after fasting for at least 8 hours.  Glucose, capillary     Status: None   Collection Time: 02/27/22  8:01 AM  Result Value Ref Range   Glucose-Capillary 97 70 - 99 mg/dL    Comment: Glucose reference range applies only to samples taken after fasting for at least 8 hours.  CBC     Status: Abnormal   Collection Time: 02/28/22  6:00 AM  Result Value Ref Range   WBC 7.9 4.0 - 10.5 K/uL   RBC 3.24 (L) 3.87 - 5.11 MIL/uL   Hemoglobin 9.4 (L) 12.0 - 15.0 g/dL   HCT 10.9 (L) 32.3 - 55.7 %   MCV 90.7 80.0 - 100.0 fL   MCH 29.0 26.0 - 34.0 pg   MCHC 32.0 30.0 - 36.0 g/dL   RDW 32.2 (H) 02.5 - 42.7 %   Platelets 522 (H) 150 - 400 K/uL   nRBC 0.0 0.0 - 0.2 %    Comment: Performed at [facility], [address] [place], [place] [address]  Glucose, capillary     Status: None   Collection Time: 02/28/22  7:58 AM  Result Value Ref Range   Glucose-Capillary 90 70 - 99 mg/dL    Comment: Glucose reference range applies only to samples taken after fasting for at least 8 hours.   DG CHEST PORT 1 VIEW  Result Date: 02/27/2022 CLINICAL DATA:  Shortness of breath EXAM: PORTABLE CHEST 1 VIEW COMPARISON:  02/25/2022 FINDINGS: Check shadow is stable. Feeding catheter has been removed in the interval. The overall inspiratory effort is poor. Basilar atelectatic changes are noted on the right as well as pleural effusion layering posteriorly. Right-sided PICC is noted with the tip in the left innominate vein. This is a change when compared with the prior exam. No bony abnormality is noted. IMPRESSION: Posteriorly layering effusion on the right. Malposition of  the right-sided PICC as described. Electronically Signed   By: Alcide Clever M.D.   On: 02/27/2022 22:16      Last menstrual period 10/26/2021, unknown if currently breastfeeding.  Medical  Problem List and Plan: 1. Functional deficits secondary to debility resulting from pelvic abscesses On IV Flagyll 500mg  po q12h and Ceftriaxone 2g qd until 7/18, Doxycycline 100mg  po BID through 7/11  -patient may not shower  -ELOS/Goals: 7 to 10 days mod I goals 2.  Antithrombotics: -DVT/anticoagulation:  Lovenox 40mg  [place] daily  -antiplatelet therapy: NA 3. Pain Management: fentanyl patch , oxyIR 5mg  Q8 prn, gabapentin 300mg  TID, flexeril 5mg  TID prn 4. Mood/Sleep: cymbalta 60mg  qd which patient has been refusing  -antipsychotic agents:  5. Neuropsych/cognition: This patient is capable of making decisions on her own behalf.  Of note is that the pt signed out AMA ~3wks ago  6. Skin/Wound Care: WOC consult for wound vac , WOC following q M-W-F on acute  7. Fluids/Electrolytes/Nutrition: check CMET in am  8.  Migraines - has had IV compazine and benadryl in past, consider triptan  9.  Constipation dulc supp, Senokot 1 p.o. nightly, MiraLAX twice daily 10.  GERD/nausea- protonix, mylicon 80mg  po q 6h prn, Zofran 4 mg IV or p.o. every 6 hours as needed 11.  Insomnia trazodone 50mg  qhs 9/11, MD 02/28/2022

## 2022-02-28 NOTE — Progress Notes (Signed)
Inpatient Rehabilitation Admission Medication Review by a Pharmacist  A complete drug regimen review was completed for this patient to identify any potential clinically significant medication issues.  High Risk Drug Classes Is patient taking? Indication by Medication  Antipsychotic No   Anticoagulant Yes Enoxaparin: VTE prophylaxis  Antibiotic Yes Ceftriaxone (thru 7/17), doxycycline (thru 7/11), Flagyl (thru 7/18): intra-abdominal infection  Opioid Yes Fentanyl patch, oxycodone: acute pain  Antiplatelet No   Hypoglycemics/insulin No   Vasoactive Medication No   Chemotherapy No   Other Yes Duloxetine: BPD, pain Gabapentin: pain Pantoprazole: GERD Miralax, Senokot-S, bisacodyl: bowel regimen Trazodone: sleep Cyclobenzaprine: muscle spasms     Type of Medication Issue Identified Description of Issue Recommendation(s)  Drug Interaction(s) (clinically significant)     Duplicate Therapy     Allergy     No Medication Administration End Date     Incorrect Dose     Additional Drug Therapy Needed     Significant med changes from prior encounter (inform family/care partners about these prior to discharge). No PTA medications   Other       Clinically significant medication issues were identified that warrant physician communication and completion of prescribed/recommended actions by midnight of the next day:  No   Time spent performing this drug regimen review (minutes):  20 min   Loura Back, PharmD, BCPS Clinical Pharmacist Clinical phone for 02/28/2022 until 3p is [phone] 02/28/2022 3:33 PM

## 2022-03-01 ENCOUNTER — Inpatient Hospital Stay (HOSPITAL_COMMUNITY): Payer: Medicaid Other

## 2022-03-01 DIAGNOSIS — F32A Depression, unspecified: Secondary | ICD-10-CM | POA: Diagnosis not present

## 2022-03-01 DIAGNOSIS — R112 Nausea with vomiting, unspecified: Secondary | ICD-10-CM | POA: Diagnosis not present

## 2022-03-01 DIAGNOSIS — E8770 Fluid overload, unspecified: Secondary | ICD-10-CM | POA: Diagnosis not present

## 2022-03-01 DIAGNOSIS — R7989 Other specified abnormal findings of blood chemistry: Secondary | ICD-10-CM

## 2022-03-01 DIAGNOSIS — R5381 Other malaise: Secondary | ICD-10-CM | POA: Diagnosis not present

## 2022-03-01 LAB — COMPREHENSIVE METABOLIC PANEL
ALT: 121 U/L — ABNORMAL HIGH (ref 0–44)
AST: 129 U/L — ABNORMAL HIGH (ref 15–41)
Albumin: 2.7 g/dL — ABNORMAL LOW (ref 3.5–5.0)
Alkaline Phosphatase: 144 U/L — ABNORMAL HIGH (ref 38–126)
Anion gap: 13 (ref 5–15)
BUN: 16 mg/dL (ref 6–20)
CO2: 24 mmol/L (ref 22–32)
Calcium: 9.6 mg/dL (ref 8.9–10.3)
Chloride: 98 mmol/L (ref 98–111)
Creatinine, Ser: 0.86 mg/dL (ref 0.44–1.00)
GFR, Estimated: >60 mL/min (ref >60)
Glucose, Bld: 88 mg/dL (ref 70–99)
Potassium: 3.5 mmol/L (ref 3.5–5.1)
Sodium: 135 mmol/L (ref 135–145)
Total Bilirubin: 0.7 mg/dL (ref 0.3–1.2)
Total Protein: 8.8 g/dL — ABNORMAL HIGH (ref 6.5–8.1)

## 2022-03-01 LAB — CBC WITH DIFFERENTIAL/PLATELET
Abs Immature Granulocytes: 0.04 10*3/uL (ref 0.00–0.07)
Basophils Absolute: 0.1 10*3/uL (ref 0.0–0.1)
Basophils Relative: 1 %
Eosinophils Absolute: 0.2 10*3/uL (ref 0.0–0.5)
Eosinophils Relative: 2 %
HCT: 30.6 % — ABNORMAL LOW (ref 36.0–46.0)
Hemoglobin: 9.9 g/dL — ABNORMAL LOW (ref 12.0–15.0)
Immature Granulocytes: 0 %
Lymphocytes Relative: 29 %
Lymphs Abs: 2.6 10*3/uL (ref 0.7–4.0)
MCH: 29.3 pg (ref 26.0–34.0)
MCHC: 32.4 g/dL (ref 30.0–36.0)
MCV: 90.5 fL (ref 80.0–100.0)
Monocytes Absolute: 0.8 10*3/uL (ref 0.1–1.0)
Monocytes Relative: 9 %
Neutro Abs: 5.3 10*3/uL (ref 1.7–7.7)
Neutrophils Relative %: 59 %
Platelets: 572 10*3/uL — ABNORMAL HIGH (ref 150–400)
RBC: 3.38 MIL/uL — ABNORMAL LOW (ref 3.87–5.11)
RDW: 19.5 % — ABNORMAL HIGH (ref 11.5–15.5)
WBC: 9 10*3/uL (ref 4.0–10.5)
nRBC: 0 % (ref 0.0–0.2)

## 2022-03-01 MED ORDER — FUROSEMIDE 10 MG/ML IJ SOLN
40.0000 mg | Freq: Every day | INTRAMUSCULAR | Status: DC
  Administered 2022-03-01 – 2022-03-03 (×3): 40 mg via INTRAVENOUS
  Filled 2022-03-01 (×4): qty 4

## 2022-03-01 MED ORDER — PANTOPRAZOLE SODIUM 40 MG IV SOLR
40.0000 mg | Freq: Two times a day (BID) | INTRAVENOUS | Status: AC
Start: 2022-03-01 — End: 2022-03-02
  Administered 2022-03-01: 40 mg via INTRAVENOUS
  Filled 2022-03-01: qty 10

## 2022-03-01 MED ORDER — PROCHLORPERAZINE 25 MG RE SUPP: 25.0000 mg | Freq: Two times a day (BID) | RECTAL | Status: DC | PRN

## 2022-03-01 MED ORDER — OXYCODONE HCL 5 MG PO TABS: 10.0000 mg | ORAL_TABLET | ORAL | Status: DC

## 2022-03-01 MED ORDER — SODIUM CHLORIDE 0.9% FLUSH
10.0000 mL | INTRAVENOUS | Status: DC | PRN
  Administered 2022-03-02: 10 mL

## 2022-03-01 MED ORDER — HYDROXYZINE HCL 10 MG PO TABS
10.0000 mg | ORAL_TABLET | ORAL | Status: DC
  Administered 2022-03-03 – 2022-03-05 (×2): 10 mg via ORAL
  Filled 2022-03-01 (×3): qty 1

## 2022-03-01 MED ORDER — METRONIDAZOLE 500 MG/100ML IV SOLN
500.0000 mg | Freq: Two times a day (BID) | INTRAVENOUS | Status: DC
  Administered 2022-03-01 – 2022-03-03 (×4): 500 mg via INTRAVENOUS
  Filled 2022-03-01 (×5): qty 100

## 2022-03-01 MED ORDER — DULOXETINE HCL 20 MG PO CPEP: 20.0000 mg | ORAL_CAPSULE | Freq: Every day | ORAL | Status: DC

## 2022-03-01 MED ORDER — BOOST / RESOURCE BREEZE PO LIQD CUSTOM: 1.0000 | Freq: Three times a day (TID) | ORAL | Status: DC

## 2022-03-01 MED ORDER — PROCHLORPERAZINE MALEATE 5 MG PO TABS
10.0000 mg | ORAL_TABLET | Freq: Four times a day (QID) | ORAL | Status: DC | PRN
  Administered 2022-03-03 – 2022-03-04 (×3): 10 mg via ORAL
  Filled 2022-03-01 (×3): qty 2

## 2022-03-01 MED ORDER — SODIUM CHLORIDE 0.9% FLUSH
10.0000 mL | Freq: Two times a day (BID) | INTRAVENOUS | Status: DC
  Administered 2022-03-01 – 2022-03-03 (×5): 10 mL

## 2022-03-01 MED ORDER — OXYCODONE HCL 5 MG PO TABS
10.0000 mg | ORAL_TABLET | Freq: Once | ORAL | Status: AC
  Administered 2022-03-01: 10 mg via ORAL
  Filled 2022-03-01: qty 2

## 2022-03-01 MED ORDER — OXYCODONE HCL 5 MG PO TABS
5.0000 mg | ORAL_TABLET | ORAL | Status: DC | PRN
  Administered 2022-03-01 – 2022-03-02 (×2): 5 mg via ORAL
  Filled 2022-03-01 (×3): qty 1

## 2022-03-01 MED ORDER — SODIUM CHLORIDE 0.9 % IV SOLN
100.0000 mg | Freq: Two times a day (BID) | INTRAVENOUS | Status: AC
  Administered 2022-03-01 – 2022-03-02 (×3): 100 mg via INTRAVENOUS
  Filled 2022-03-01 (×4): qty 100

## 2022-03-01 MED ORDER — PANTOPRAZOLE SODIUM 40 MG PO TBEC
40.0000 mg | DELAYED_RELEASE_TABLET | Freq: Every day | ORAL | Status: DC
  Administered 2022-03-04 – 2022-03-05 (×2): 40 mg via ORAL
  Filled 2022-03-01 (×3): qty 1

## 2022-03-01 MED ORDER — PROCHLORPERAZINE EDISYLATE 10 MG/2ML IJ SOLN
10.0000 mg | Freq: Four times a day (QID) | INTRAMUSCULAR | Status: DC | PRN
  Administered 2022-03-01 – 2022-03-02 (×2): 10 mg via INTRAVENOUS
  Filled 2022-03-01 (×2): qty 2

## 2022-03-01 MED ORDER — HYDROXYZINE HCL 10 MG PO TABS
10.0000 mg | ORAL_TABLET | Freq: Three times a day (TID) | ORAL | Status: DC | PRN
Start: 2022-03-01 — End: 2022-03-05
  Administered 2022-03-02 – 2022-03-03 (×2): 10 mg via ORAL
  Filled 2022-03-01 (×2): qty 1

## 2022-03-01 MED ORDER — ENOXAPARIN SODIUM 30 MG/0.3ML IJ SOSY
30.0000 mg | PREFILLED_SYRINGE | INTRAMUSCULAR | Status: DC
  Filled 2022-03-01: qty 0.3

## 2022-03-01 MED ORDER — ADULT MULTIVITAMIN W/MINERALS CH: 1.0000 | ORAL_TABLET | Freq: Every day | ORAL | Status: DC

## 2022-03-01 NOTE — Consult Note (Signed)
WOC Nurse Consult Note: Patient receiving care in [facility] [phone] Reason for Consult: NPWT dressing changes Wound type: midline LQ abdominal surgical incision.  Pressure Injury POA: NA Measurement: 8 cm x 3.5 cm x 2 cm Wound bed: See photo taken today.  Drainage (amount, consistency, odor) serosanguinous in canister with minimal output Periwound: intact Dressing procedure/placement/frequency: Removed 1 piece black foam, 1 piece mepitel and replaced with 1 piece of black foam and mepitel. Drape applied, immediate suction obtained at 125 mmHG.   WOC will follow MWF @ 8:30 for dressing changes.   Renaldo Reel Katrinka Blazing, MSN, RN, CMSRN, Angus Seller, [facility] Wound Treatment Associate Pager [phone]

## 2022-03-01 NOTE — Progress Notes (Signed)
Initial Nutrition Assessment  DOCUMENTATION CODES:   Not applicable  INTERVENTION:   Discontinue Ensure Enlive, replace with Boost Breeze po TID, each supplement provides 250 kcal and 9 grams of protein Continue Multivitamin w/ minerals daily Encourage good PO intake   NUTRITION DIAGNOSIS:   Increased nutrient needs related to wound healing as evidenced by estimated needs.  GOAL:   Patient will meet greater than or equal to 90% of their needs   MONITOR:   PO intake, Supplement acceptance, Labs, Weight trends, I & O's, Skin  REASON FOR ASSESSMENT:   Malnutrition Screening Tool, Consult Poor PO  ASSESSMENT:   31 y.o. female admitted to CIR after ex lap due to perihepatic abscess. No significant PMH.   Pt resting in bed at time of RD visit. Pt reports that she has not been eating well due to nausea; reports that she has been receiving medication for it though. Reports that she has been drinking the [employer] but they are "nasty." This RD discussed the importance of proper nutrition to help heal abdominal incision and increased needs with therapy. Discussed with pt that we will have to consider replacing Cortrak if her PO intake does not improve.  Pt agreeable to drink at least two ONS daily. Encouraged pt to ask family/friends to bring in food.  Medications reviewed and include: Doxycycline, MVI, Protonix, Miralax, Senokot, IV antibiotics Labs reviewed.  Wound VAC: 50 mL x 24 hours  Diet Order:   Diet Order             Diet regular Room service appropriate? Yes; Fluid consistency: Thin  Diet effective now                   EDUCATION NEEDS:   No education needs have been identified at this time  Skin:  Skin Assessment: Skin Integrity Issues: Skin Integrity Issues:: Wound VAC Wound Vac: Abdomen  Last BM:  7/7  Height:   Ht Readings from Last 1 Encounters:  02/28/22 5\' 3"  (1.6 m)    Weight:   Wt Readings from Last 1 Encounters:  02/28/22 67.6 kg     BMI:  Body mass index is 26.4 kg/m.  Estimated Nutritional Needs:   Kcal:  2100-2300  Protein:  105-120 grams  Fluid:  >/= 2.1 L    05/01/22 RD, LDN Clinical Dietitian See [facility] for contact information.

## 2022-03-01 NOTE — Progress Notes (Signed)
PROGRESS NOTE   Subjective/Complaints: Pt with continues nausea and abdominal pain today. Her flagyl was changed to IV. Cymbalta was stopped. Picc line replaced today. She was started on IV lasix.    Review of Systems  Constitutional:  Negative for fever.  Respiratory:  Positive for shortness of breath.   Cardiovascular:  Negative for chest pain.  Gastrointestinal:  Positive for abdominal pain and nausea.      Objective:   DG Chest 2 View  Result Date: 03/01/2022 CLINICAL DATA:  Pleural effusion. PICC line position. Low-grade fever. EXAM: CHEST - 2 VIEW COMPARISON:  AP chest 02/27/2022; CT chest 02/22/2022 FINDINGS: Interval repositioning of the patient's right upper extremity PICC which previously cross the midline and terminated over the left brachiocephalic vein. This right upper extremity PICC tip now appropriately descends into the superior vena cava with the tip overlying the mid to inferior aspect of the superior vena cava. Cardiac silhouette and mediastinal contours are within normal limits. Mildly improved aeration of the right mid lung. Mild bilateral costophrenic angle opacities may represent right-greater-than-left pleural effusions with associated airspace opacity similar to prior. No pneumothorax is seen. No acute skeletal abnormality. IMPRESSION: 1. Interval repositioning of right upper extremity PICC with the tip now appropriately overlying the mid to inferior aspect of the superior vena cava. 2. Mildly improved aeration of the right midlung. Likely small right-greater-than-left pleural effusions with associated atelectasis versus pneumonia, similar to prior radiographs and 02/22/2022 chest CT. Electronically Signed   By: Neita Garnet M.D.   On: 03/01/2022 12:39   DG CHEST PORT 1 VIEW  Result Date: 02/27/2022 CLINICAL DATA:  Shortness of breath EXAM: PORTABLE CHEST 1 VIEW COMPARISON:  02/25/2022 FINDINGS: Check shadow is  stable. Feeding catheter has been removed in the interval. The overall inspiratory effort is poor. Basilar atelectatic changes are noted on the right as well as pleural effusion layering posteriorly. Right-sided PICC is noted with the tip in the left innominate vein. This is a change when compared with the prior exam. No bony abnormality is noted. IMPRESSION: Posteriorly layering effusion on the right. Malposition of the right-sided PICC as described. Electronically Signed   By: Alcide Clever M.D.   On: 02/27/2022 22:16   Recent Labs    02/28/22 0600 03/01/22 0408  WBC 7.9 9.0  HGB 9.4* 9.9*  HCT 29.4* 30.6*  PLT 522* 572*   Recent Labs    03/01/22 0408  NA 135  K 3.5  CL 98  CO2 24  GLUCOSE 88  BUN 16  CREATININE 0.86  CALCIUM 9.6    Intake/Output Summary (Last 24 hours) at 03/01/2022 1751 Last data filed at 03/01/2022 1454 Gross per 24 hour  Intake 230 ml  Output --  Net 230 ml        Physical Exam: Vital Signs Blood pressure 106/72, pulse (!) 119, temperature 97.9 F (36.6 C), resp. rate 18, height 5\' 3"  (1.6 m), weight 67.6 kg, last menstrual period 10/26/2021, SpO2 91 %, unknown if currently breastfeeding.    Assessment/Plan: 1. Functional deficits which require 3+ hours per day of interdisciplinary therapy in a comprehensive inpatient rehab setting. Physiatrist is providing close team supervision and  24 hour management of active medical problems listed below. Physiatrist and rehab team continue to assess barriers to discharge/monitor patient progress toward functional and medical goals  Care Tool:  Bathing    Body parts bathed by patient: Face   Body parts bathed by helper: Left arm, Right arm, Chest, Abdomen, Front perineal area, Buttocks, Right upper leg, Left upper leg, Right lower leg, Left lower leg     Bathing assist Assist Level: Total Assistance - Patient < 25%     Upper Body Dressing/Undressing Upper body dressing   What is the patient wearing?:  Hospital gown only    Upper body assist Assist Level: Total Assistance - Patient < 25%    Lower Body Dressing/Undressing Lower body dressing      What is the patient wearing?: Hospital gown only     Lower body assist Assist for lower body dressing: Dependent - Patient 0%     Toileting Toileting    Toileting assist Assist for toileting: Total Assistance - Patient < 25%     Transfers Chair/bed transfer  Transfers assist  Chair/bed transfer activity did not occur: Refused  Chair/bed transfer assist level: Contact Guard/Touching assist     Locomotion Ambulation   Ambulation assist   Ambulation activity did not occur: Refused          Walk 10 feet activity   Assist  Walk 10 feet activity did not occur: Refused        Walk 50 feet activity   Assist Walk 50 feet with 2 turns activity did not occur: Refused         Walk 150 feet activity   Assist Walk 150 feet activity did not occur: Refused         Walk 10 feet on uneven surface  activity   Assist Walk 10 feet on uneven surfaces activity did not occur: Refused         Wheelchair     Assist Is the patient using a wheelchair?: No   Wheelchair activity did not occur: Refused         Wheelchair 50 feet with 2 turns activity    Assist    Wheelchair 50 feet with 2 turns activity did not occur: Refused       Wheelchair 150 feet activity     Assist  Wheelchair 150 feet activity did not occur: Refused       Blood pressure 106/72, pulse (!) 119, temperature 97.9 F (36.6 C), resp. rate 18, height 5\' 3"  (1.6 m), weight 67.6 kg, last menstrual period 10/26/2021, SpO2 91 %, unknown if currently breastfeeding.   Medical Problem List and Plan: 1. Functional deficits secondary to debility resulting from pelvic abscesses On IV Flagyll 500mg  po q12h and Ceftriaxone 2g qd until 7/18, Doxycycline 100mg  po BID through 7/11             -patient may not shower              -ELOS/Goals: 7 to 10 days mod I goals  -PT/OT/SLP evals -7/10 Picc Line replaced today, Flagyl changed to IV, She was discussed with ID 2.  Antithrombotics: -DVT/anticoagulation:  Lovenox 40mg  [place] daily          -antiplatelet therapy: NA 3. Pain Management: fentanyl patch 8/18, oxyIR 5mg  Q8 prn, gabapentin 300mg  TID, flexeril 5mg  TID prn 4. Mood/Sleep: cymbalta 60mg  qd which patient has been refusing             -antipsychotic agents:   -  7/10 Cymbalta stopped due to possible nausea 5. Neuropsych/cognition: This patient is capable of making decisions on her own behalf.  Of note is that the pt signed out AMA ~3wks ago  6. Skin/Wound Care: WOC consult for wound vac , WOC following q M-W-F on acute  7. Fluids/Electrolytes/Nutrition: check CMET in am  8.  Migraines - has had IV compazine and benadryl in past, consider triptan  9.  Constipation dulc supp, Senokot 1 p.o. nightly, MiraLAX twice daily 10.  GERD/nausea- protonix, mylicon 80mg  po q 6h prn, Zofran 4 mg IV or p.o. every 6 hours as needed -7/10 protonix changed to IV 11.  Insomnia trazodone 50mg  qhs 12. Elevated LFT  -hx of liver abscess, continue to monitor 13. Shortness of breath, on O2  -Likely related to fluid overload, she had been getting IV lasix, will continue  LOS: 1 days A FACE TO FACE EVALUATION WAS PERFORMED  03/01/2022, 5:51 PM

## 2022-03-01 NOTE — Progress Notes (Signed)
Inpatient Rehabilitation Care Coordinator Assessment and Plan Patient Details  Name: Paula Lucas MRN: [id number] Date of Birth: 19-Aug-1991  Today's Date: 03/01/2022  Hospital Problems: Principal Problem:   Debility  Past Medical History:  Past Medical History:  Diagnosis Date   Asthma    BMI 31.0-31.9,adult    Chlamydia    Gonorrhea    TOA (tubo-ovarian abscess)    Past Surgical History:  Past Surgical History:  Procedure Laterality Date   INDUCED ABORTION     IR RADIOLOGIST EVAL & MGMT  05/17/2018   LAPAROTOMY N/A 02/16/2022   Procedure: EXPLORATORY LAPAROTOMY WITH ABDOMINAL WASHOUT;  Surgeon: Reva Bores, MD;  Location[facility] OR;  Service: Gynecology;  Laterality: N/A;   SMALL BOWEL REPAIR N/A 02/16/2022   Procedure: SMALL BOWEL REPAIR;  Surgeon: Violeta Gelinas, MD;  Location: [facility] OR;  Service: General;  Laterality: N/A;   TONSILLECTOMY     Social History:  reports that she has been smoking cigarettes. She has a 4.50 pack-year smoking history. She has never used smokeless tobacco. She reports current drug use. Drug: Marijuana. She reports that she does not drink alcohol.  Family / Support Systems Marital Status: Single Patient Roles: Other (Comment), Parent (daughter) Children: 66 month old living with another family member Other Supports: [name] [phone] Anticipated Caregiver: Self and Mom Ability/Limitations of Caregiver: Mom is recovering from a CVA and is not able to assist pt Caregiver Availability: 24/7 Family Dynamics: Close with Mom but does her own thing she is head strong and likes to be independent and on her own. She wants to go home ASAP but does not seem to understand how sick she is  Social History Preferred language: English Religion: None Cultural Background: No issues Education: HS Health Literacy - How often do you need to have someone help you when you read instructions, pamphlets, or other written material from your doctor or  pharmacy?: Never Writes: Yes Employment Status: Employed Name of Employer: Has worked at [employer] Return to Work Plans: unsure if able Marine scientist Issues: No issues Guardian/Conservator: None-according to MD pt is capable of making her own decisions while here   Abuse/Neglect Abuse/Neglect Assessment Can Be Completed: Yes Physical Abuse: Denies Verbal Abuse: Denies Sexual Abuse: Denies Exploitation of patient/patient's resources: Denies Self-Neglect: Denies  Patient response to: Social Isolation - How often do you feel lonely or isolated from those around you?: Never  Emotional Status Pt's affect, behavior and adjustment status: Pt is wanting to go home soon, tired of being in the hospital. She does not realize how sick she has been. She hopes to be independent and feels she will. She will do what ever it takes to take care of herself, always has Recent Psychosocial Issues: other health issues-hospitalized for same thing the past few years Psychiatric History: No history would benefit from seeing neuro-psych while here due to young age and how ill she has been Substance Abuse History: No issues  Patient / Family Perceptions, Expectations & Goals Pt/Family understanding of illness & functional limitations: Pt can explain she is in the hospital and basically what has happened to her. She does talk with the MD rounding but does refuse some of the medicines prescribed to her. Questions if fully understands what has happened to her and recovery process Premorbid pt/family roles/activities: daughter, employee, friend, mom, etc Anticipated changes in roles/activities/participation: resume Pt/family expectations/goals: Pt states: " I want to go home soon. "  [employer]: None Premorbid Home  Care/DME Agencies: None Transportation available at discharge: Friend ?? Is the patient able to respond to transportation needs?: Yes In the past 12  months, has lack of transportation kept you from medical appointments or from getting medications?: Yes In the past 12 months, has lack of transportation kept you from meetings, work, or from getting things needed for daily living?: Yes Resource referrals recommended: Neuropsychology  Discharge Planning Living Arrangements: Alone Support Systems: Parent, Friends/neighbors Type of Residence: Private residence Insurance Resources: Medicaid (specify county) (Unsure if active) Architect: Employment, Garment/textile technologist Screen Referred: Yes Living Expenses: Other (Comment) (lived in hotel prior to coming to the hospital) Money Management: Patient Does the patient have any problems obtaining your medications?: Yes (Describe) (doesnt have PCP) Home Management: self Patient/Family Preliminary Plans: Plans to go to [facility] at discharge, confirmed by [name] prior to admission. Pt has several wounds and JP drain without insurance will not be able to get [name] or wound assistance. Care Coordinator Barriers to Discharge: Lack of/limited family support, Behavior, IV antibiotics, Wound Care Care Coordinator Anticipated Follow Up Needs: HH/OP  Clinical Impression  Unfortuanate female who is quite ill. Doing better and wants to go home ASAP. Plan to go to Mom's but Mom is recovering from a CVA herself and can not assist. Pt has many wounds and will need follow up and care at discharge. Will [facility] to get into [facility] and wom[facility] clinic. Will clarify if Medicaid is active or not. Will make neuro-psych referral for coping and work on discharge needs.  [name] 03/01/2022, 1:18 PM

## 2022-03-01 NOTE — Progress Notes (Signed)
Patient c/o L chest pain 9/10. Described pain as discomfort with difficulty breathing. Vitals 99 temp, 110/82 HR 125 19 92 on room air. Oxygen via nasal canula initiated at 2l/min. Jacalyn Lefevre was notified and ordered EKG. Result of EKG not transmitting to epic. Notified Riley Lam of result. RR saw patient/ EKG result. Patient denied chest pain while performing ekg but asked for pain medication later on. Patient also refusing her lovenox. Education provided verbally regarding risk for blood clot, patient acknowledged understanding. Doxycycline changed to IV as patient refused oral medication as it makes her nauseated. Jacalyn Lefevre aware of the medication refusal. Will continue to monitor patient.

## 2022-03-01 NOTE — Progress Notes (Signed)
Patient refused 0800 Enoxaparin injection after being informed it had to be placed in her abdomen. Writer explained that this med was to prevent blood clots and patient still refused. P Love, PA notified

## 2022-03-01 NOTE — Evaluation (Signed)
Occupational Therapy Assessment and Plan  Patient Details  Name: Paula Lucas MRN: [id number] Date of Birth: August 13, 1991  OT Diagnosis: acute pain and muscle weakness (generalized) Rehab Potential: Rehab Potential (ACUTE ONLY): Fair ELOS: 10-12 days   Today's Date: 03/01/2022 Session 1: OT Individual Time: 1045-1200 OT Individual Time Calculation (min): 75 min    Session 2:  OT Individual Time: 6283-1517 OT Individual Time Calculation (min): 25 min     Missed Therapy Minutes: 20 mins Due to increased pain in back and abdomen and nausea.  Hospital Problem: Principal Problem:   Debility   Past Medical History:  Past Medical History:  Diagnosis Date   Asthma    BMI 31.0-31.9,adult    Chlamydia    Gonorrhea    TOA (tubo-ovarian abscess)    Past Surgical History:  Past Surgical History:  Procedure Laterality Date   INDUCED ABORTION     IR RADIOLOGIST EVAL & MGMT  05/17/2018   LAPAROTOMY N/A 02/16/2022   Procedure: EXPLORATORY LAPAROTOMY WITH ABDOMINAL WASHOUT;  Surgeon: Donnamae Jude, MD;  Location: [place];  Service: Gynecology;  Laterality: N/A;   SMALL BOWEL REPAIR N/A 02/16/2022   Procedure: SMALL BOWEL REPAIR;  Surgeon: Georganna Skeans, MD;  Location: [place];  Service: General;  Laterality: N/A;   TONSILLECTOMY      Assessment & Plan Clinical Impression: Patient is a 31 year old female admitted with sepsis from left tubo-ovarian abscess and pyometrium.  Prior history of gonorrhea, chlamydia in 2019.  Was seen in the ED at [facility] on 02/04/2022.  Patient had vaginal bleeding pain and fever treated with Zosyn Septra trioxsalen doxycycline Flagyl and transferred to the ICU.  Initially was also complaining of bilateral lower extremity numbness.  Neurology consultation was obtained and symptoms were nonphysiologic.  A drainage catheter was placed by IR into the complex fluid collection that was initially 12 x 8 cm.  Was evaluated by OB/GYN but no surgery was recommended.  The  patient checked out AMA on Father's Day to visit her father's grave and then return to the hospital.  A new drain was placed.  General surgery was consulted on 02/15/2022.  CT of the abdomen showed perihepatic abscess.  Patient did go to the OR with OB/GYN, wound care was consulted postoperatively for wound VAC placement lower quadrant abdominal surgical incision midline.  Patient refused the wound VAC.  Wound care was consulted again on 02/23/2022 when patient agreed to have wound VAC placed.  Wound care has been following Monday Wednesdays Fridays.  Initial measurement of her sponge was 8 x 5 x 4 cm, most recent 8 x 4 x 3 cm.  Last PT note on 02/28/2022 Patient transferred to CIR on 02/28/2022 .    Patient currently requires total with basic self-care skills secondary to muscle weakness, decreased cardiorespiratoy endurance, decreased coordination, and decreased sitting balance, decreased standing balance, decreased balance strategies, and increased pain.  Prior to hospitalization, patient could complete BADL and IADL with independent .  Patient will benefit from skilled intervention to increase independence with basic self-care skills and increase level of independence with iADL prior to discharge home with care partner.  Anticipate patient will require  no supervision  and  OT follow up is TBD based on patient's functional performance .  OT - End of Session Activity Tolerance: Tolerates < 10 min activity with changes in vital signs;Decreased this session Endurance Deficit: Yes Endurance Deficit Description: Pt experiencing nausea during evaluation. Reports that it is from her anibiotics.  Was able to tolerate sitting up in w/c for ~30 minutes while at sink although participation was very limited. OT Assessment Rehab Potential (ACUTE ONLY): Fair OT Barriers to Discharge: Home environment access/layout;New oxygen;Lack of/limited family support;Wound Care;Other (comments) OT Barriers to Discharge Comments:  unknown on discharge environment or amount of support Mom will be able to provide. Increased nausea during evaluation which limited patient's ability to participate fully. OT Patient demonstrates impairments in the following area(s): Balance;Endurance;Pain;Safety OT Basic ADL's Functional Problem(s): Grooming;Eating;Bathing;Dressing;Toileting OT Transfers Functional Problem(s): Toilet;Tub/Shower OT Plan OT Intensity: Minimum of 1-2 x/day, 45 to 90 minutes OT Frequency: 5 out of 7 days OT Duration/Estimated Length of Stay: 10-12 days OT Treatment/Interventions: Balance/vestibular training;Discharge planning;Functional mobility training;Therapeutic Activities;UE/LE Coordination activities;Patient/family education;Functional electrical stimulation;Community reintegration;DME/adaptive equipment instruction;Pain management;Skin care/wound managment;UE/LE Strength taining/ROM;Neuromuscular re-education;Self Care/advanced ADL retraining;Therapeutic Exercise;Wheelchair propulsion/positioning OT Basic Self-Care Anticipated Outcome(s): Mod I OT Toileting Anticipated Outcome(s): Mod I OT Bathroom Transfers Anticipated Outcome(s): Mod I OT Recommendation Recommendations for Other Services: Neuropsych consult;Therapeutic Recreation consult Therapeutic Recreation Interventions: Outing/community reintergration Patient destination: Home Follow Up Recommendations: Other (comment) (TBD) Equipment Recommended: To be determined   OT Evaluation Precautions/Restrictions  Precautions Precautions: Fall Precaution Comments: Contact;  abd JP x2 drains, wound vac; watch HR/O2 Restrictions Weight Bearing Restrictions: No Pain Pain Assessment Pain Scale: 0-10 Pain Score: 0-No pain Home Living/Prior Functioning Home Living Family/patient expects to be discharged to:: Private residence Living Arrangements: Alone Available Help at Discharge: Available 24 hours/day (Mother) Type of Home: Apartment (Plans to live  with Mom at discharge) Home Access:  (unknown) Entrance Stairs-Rails:  (unsure) Home Layout: Multi-level Alternate Level Stairs-Number of Steps: to get to bathroom 7-8 Alternate Level Stairs-Rails: Left, Right Bathroom Shower/Tub: Optometrist:  (unknown)  Lives With: Alone IADL History Current License: No Mode of Transportation: Musician, Bus Occupation: 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 Prior Function Level of Independence: Independent with gait, Independent with basic ADLs, Independent with homemaking with ambulation, Independent with transfers  Able to Take Stairs?: Yes Driving: No Vision Baseline Vision/History: 0 No visual deficits Ability to See in Adequate Light: 0 Adequate Patient Visual Report: No change from baseline Vision Assessment?: No apparent visual deficits  Cognition Cognition Overall Cognitive Status: Within Functional Limits for tasks assessed Arousal/Alertness: Lethargic (pain effecting ability to be fully alert at times.) Memory: Appears intact Awareness: Appears intact Problem Solving: Impaired Behaviors: Restless;Other (comment) (flat effect) Safety/Judgment: Appears intact Brief Interview for Mental Status (BIMS) Repetition of Three Words (First Attempt):  (unable to participate due to pain) Sensation Sensation Light Touch: Appears Intact Hot/Cold: Appears Intact Proprioception: Appears Intact Stereognosis: Appears Intact Coordination Gross Motor Movements are Fluid and Coordinated: No Fine Motor Movements are Fluid and Coordinated: No Coordination and Movement Description: coordination effected by increased nausea during evaluation. patient with low energy level which effected motivation Motor  Motor Motor: Within Functional Limits  Trunk/Postural Assessment  Cervical Assessment Cervical Assessment: Exceptions to [facility] (forward head) Thoracic  Assessment Thoracic Assessment: Exceptions to [facility] (rounded shoulders) Lumbar Assessment Lumbar Assessment: Exceptions to [facility] (posterior pelvic tilt) Postural Control Postural Control: Deficits on evaluation Trunk Control: Due to recent surgery, pt maintained a sitting posture while maintaining a position that prevented pain such as forward head, rounded shoulders, and posterior pelvic tilt  Balance Balance Balance Assessed: Yes Static Sitting Balance Static Sitting - Balance Support: No upper extremity supported;Feet unsupported Static Sitting - Level of Assistance: 5: Stand by assistance Static Standing Balance  Static Standing - Balance Support: Right upper extremity supported;During functional activity Static Standing - Level of Assistance: 4: Min assist Extremity/Trunk Assessment RUE Assessment RUE Assessment: Exceptions to [facility] Active Range of Motion (AROM) Comments: Pt unable to demonstrate [place] A/ROM due to pain from recent surgery. Without pain, I would estimate that her ROM is [facility]. RUE Strength RUE Overall Strength: Due to pain;Deficits RUE Overall Strength Comments: strength effected by pain during MMT. Right Shoulder Flexion: 3-/5 Right Shoulder ABduction: 3-/5 Right Shoulder Internal Rotation: 4/5 Right Shoulder External Rotation: 4/5 Right Elbow Flexion: 3+/5 Right Elbow Extension: 4/5 Right Hand Gross Grasp: Functional LUE Assessment LUE Assessment: Exceptions to [facility] Active Range of Motion (AROM) Comments: Pt unable to demonstrate [facility] A/ROM due to pain from recent surgery. Without pain, I would estimate that her ROM is WFL LUE Strength LUE Overall Strength: Deficits;Due to pain Left Shoulder Flexion: 3-/5 Left Shoulder ABduction: 3-/5 Left Shoulder Internal Rotation: 4/5 Left Shoulder External Rotation: 4/5 Left Elbow Flexion: 4+/5 Left Elbow Extension: 4+/5 Left Hand Gross Grasp: Functional  Care Tool\  03/01/22 1045  CareTool - Eating  Eating Assist Level  Set up assist  CareTool - Oral Care  Oral care, brush teeth, clean dentures activity did not occur Refused  CareTool - Bathing  Body parts bathed by patient Face  Body parts bathed by helper Left arm;Right arm;Chest;Abdomen;Front perineal area;Buttocks;Right upper leg;Left upper leg;Right lower leg;Left lower leg  Assist Level Total Assistance - Patient < 25%  CareTool- Upper Body Dressing (including orthotics  What is the patient wearing? Hospital gown only  Assist Level Total Assistance - Patient < 25%  CareTool - Lower Body Dressing (excluding footwear)  What is the patient wearing? Hospital gown only  Assist for lower body dressing Dependent - Patient 0%  CareTool - Putting on/Taking off footwear  What is the patient wearing? Non-skid slipper socks  Assist for footwear Dependent - Patient 0%  CareTool - Toileting  Assist for toileting Total Assistance - Patient < 25%  CareTool - Bed Mobility  Roll left and right assist level Supervision/Verbal cueing  Sit to lying assist level Supervision/Verbal cueing  Lying to sitting on side of bed assist level: the ability to move from lying on the back to sitting on the side of the bed with no back support. Supervision/Verbal cueing  CareTool - Sit to stand transfer  Sit to stand assist level Contact Guard/Touching assist  CareTool - Chair/bed transfer  Chair/bed transfer assist level Contact Guard/Touching assist  CareTool - Toilet Transfers  Assist Level Contact Guard/Touching assist  CareTool - Hearing  Ability to hear (with hearing aid or hearing appliances if normally used) 0. Adequate - no difficulty in normal conservation, social interaction, listening to TV  CareTool - Expression  Expression of Ideas and Wants 4. Without difficulty (complex and basic) - expresses complex messages without difficulty and with speech that is clear and easy to understand  CareTool - Comprehension  Understanding Verbal and Non-Verbal Content 4.  Understands (complex and basic) - clear comprehension without cues or repetitions  CareTool - Memory  Memory/Recall Ability  That he or she is in a hospital/hospital unit;Current season;Staff names and faces  CareTool - Signs and Symptoms of Delirium (from CAM)  Is there evidence of an acute change in mental status from the patient's baseline? 0 No  Inattention 0 Behavior not present  Disorganized thinking 0 Behavior not present  Altered level of consciousness  0 Behavior not present  Positive CAM assessment intervention/preventative  measures Universal precautions (preventative) measures initiated   Refer to Care Plan for Long Term Goals  SHORT TERM GOAL WEEK 1 OT Short Term Goal 1 (Week 1): Patient will report a decrease in pain level to 6/10 while utilizing pain management techniques in order to participate in all therapy sessions throughout the day. OT Short Term Goal 2 (Week 1): Patient will complete all bathing and dressing at [place] while utilizing pain management techniques. OT Short Term Goal 3 (Week 1): Patient will complete all aspects of toileting at Supervision level.  Recommendations for other services: Neuropsych and Therapeutic Recreation  Stress management and Outing/community reintegration   Skilled Therapeutic Intervention  Session 1: Patient in bed upon therapy arrival. Participated in OT evaluation. Reports no pain although verbalizes increased nausea which she thinks is caused by the antibiotics. Was able to transfer out of bed to w/c. Declined to participate in actual ADL task and performance levels were estimated; see below. Patient did experience some pain in abdomen region when BUE MMT was completed. Pt is currently demonstrating increased pain, decreased endurance and activity tolerance which is directly effecting her ability to fully participate in ADL tasks requiring increased physical assist to complete at this time. Reviewed POC and therapy schedule for the  day. HR during eval: 126-135 during activity. At rest: 115.   Session 2: Patient in bed upon therapy arrival and reports 9/10 pain level in her abdomen and back. Declined to sit on EOB although did request stretches to help her back at bed level. Therapist facilitated session while providing education to patient regarding pain management importance and encouraged patient input on pain management techniques to try. Pt declined use of ice pack although open to heating pad. Secure chat message sent to Dr. Marciano Sequin to ask about obtaining an order. At this time, patient's pain level has increased to a level that directly effects her ability to fully participate in therapy sessions. Therapist provided education on bed level yoga positions to calm the nervous system, reduce pain symptoms and improve movement in order to increase her ability to participate in therapy sessions and everyday activities.   Positions explored:  -Both legs raised (using pillows/blankets). Tolerated knees positioned at less than 90 degrees. - Modified windshield wiper. Therapist provided physical assist for movement. Able to tolerate 25% range right and left.  - Attempted Left side sidelying. Terminated position due to verbalizing pain in lungs.   ADL ADL Eating: Set up Where Assessed-Eating: Bed level Grooming: Dependent Where Assessed-Grooming: Sitting at sink;Wheelchair Upper Body Bathing: Dependent Where Assessed-Upper Body Bathing: Sitting at sink;Wheelchair Lower Body Bathing: Dependent Where Assessed-Lower Body Bathing: Wheelchair;Sitting at sink Upper Body Dressing: Dependent Where Assessed-Upper Body Dressing: Wheelchair;Sitting at sink Lower Body Dressing: Dependent Where Assessed-Lower Body Dressing: Wheelchair;Sitting at sink Toileting: Dependent Where Assessed-Toileting: Bed level Toilet Transfer: Contact guard Toilet Transfer Method: Stand pivot Science writer: Grab bars Tub/Shower Transfer: Not  assessed Social research officer, government: Not assessed Mobility  Bed Mobility Bed Mobility: Supine to Sit;Sit to Supine Supine to Sit: Supervision/Verbal cueing Sit to Supine: Minimal Assistance - Patient > 75% (assisted with bringing legs up into bed) Transfers Sit to Stand: Contact Guard/Touching assist Stand to Sit: Contact Guard/Touching assist   Discharge Criteria: Patient will be discharged from OT if patient refuses treatment 3 consecutive times without medical reason, if treatment goals not met, if there is a change in medical status, if patient makes no progress towards goals or if patient is discharged from hospital.  The above assessment, treatment plan, treatment alternatives and goals were discussed and mutually agreed upon: by patient  Ailene Ravel, OTR/L,CBIS  Supplemental OT - [place] and [facility]  03/01/2022, 4:08 PM

## 2022-03-01 NOTE — Progress Notes (Signed)
Received an update by on call provider (Ms Rudd who dicussed CXR w/Dr. Wynn Banker) re: patient's hypoxia and need for supplemental oxygen as well as change in position of PICC. Charge to contact IV team to evaluate PICC and see if it can be repositioned or needs to be removed. CXR now showing right effusion--question need for tap. Will check 2 view films for better visualization.

## 2022-03-01 NOTE — Progress Notes (Signed)
Received call from [place], RN that patient's oxygen saturations were 87 % on room air.  On acute side, was on 2L [place] with SpO2 90-93 %.  Placed orders to 2L [place] for SpO2.  Per nurse, patient not in distress, but will continue to monitor and see if SpO2 will increase.

## 2022-03-01 NOTE — Plan of Care (Signed)
  Problem: RH Balance Goal: LTG Patient will maintain dynamic standing with ADLs (OT) Description: LTG:  Patient will maintain dynamic standing balance with assist during activities of daily living (OT)  Flowsheets (Taken 03/01/2022 1629) LTG: Pt will maintain dynamic standing balance during ADLs with: Independent with assistive device   Problem: RH Grooming Goal: LTG Patient will perform grooming w/assist,cues/equip (OT) Description: LTG: Patient will perform grooming with assist, with/without cues using equipment (OT) Flowsheets (Taken 03/01/2022 1629) LTG: Pt will perform grooming with assistance level of: Independent with assistive device    Problem: RH Bathing Goal: LTG Patient will bathe all body parts with assist levels (OT) Description: LTG: Patient will bathe all body parts with assist levels (OT) Flowsheets (Taken 03/01/2022 1629) LTG: Pt will perform bathing with assistance level/cueing: Independent with assistive device    Problem: RH Dressing Goal: LTG Patient will perform upper body dressing (OT) Description: LTG Patient will perform upper body dressing with assist, with/without cues (OT). Flowsheets (Taken 03/01/2022 1629) LTG: Pt will perform upper body dressing with assistance level of: Independent with assistive device Goal: LTG Patient will perform lower body dressing w/assist (OT) Description: LTG: Patient will perform lower body dressing with assist, with/without cues in positioning using equipment (OT) Flowsheets (Taken 03/01/2022 1629) LTG: Pt will perform lower body dressing with assistance level of: Independent with assistive device   Problem: RH Toileting Goal: LTG Patient will perform toileting task (3/3 steps) with assistance level (OT) Description: LTG: Patient will perform toileting task (3/3 steps) with assistance level (OT)  Flowsheets (Taken 03/01/2022 1629) LTG: Pt will perform toileting task (3/3 steps) with assistance level: Independent with assistive  device   Problem: RH Toilet Transfers Goal: LTG Patient will perform toilet transfers w/assist (OT) Description: LTG: Patient will perform toilet transfers with assist, with/without cues using equipment (OT) Flowsheets (Taken 03/01/2022 1629) LTG: Pt will perform toilet transfers with assistance level of: Independent with assistive device   Problem: RH Tub/Shower Transfers Goal: LTG Patient will perform tub/shower transfers w/assist (OT) Description: LTG: Patient will perform tub/shower transfers with assist, with/without cues using equipment (OT) Flowsheets (Taken 03/01/2022 1629) LTG: Pt will perform tub/shower stall transfers with assistance level of: Independent with assistive device

## 2022-03-01 NOTE — Progress Notes (Signed)
During routine vital check patient oxygen saturation noted to be at 86%. Educated patient on deep breathing exercises and IS use. Able to get oxygen saturation up to 88%. Patient reports ongoing shortness of breath with activity which has been noted on acute. Patient noted to have had supplemental oxygen on acute for these ongoing respiratory issues. Paula Miners NP notified. New order received for 2L oxygen [place] PRN. When applied saturation returned to 93%. Continue to monitor.

## 2022-03-01 NOTE — Significant Event (Addendum)
Rapid Response Event Note   Reason for Call :  Rapid Response pager- on arrival, RN stated pt c/o CP.  Initial Focused Assessment:  Pt appears to be resting in bed with no acute distress noted. She complains of 9/10 pain to "lungs and back". Lungs clear/Diminished throughout. When asked about the pain she states it has been present for at least 2 days and constant. Pt states "I need IV pain medicine". HR slightly elevated (not a change from prior documentation). Able to hold a conversation, description of pain location and severity seems to be constantly changing. RN states pt told her pain had resolved after EKG performed. Does not appear to be cardiac in nature.   Interventions:  EKG done prior to my arrival- ST PRN oxy given  Plan of Care:  Continue to monitor for CP Monitor Vitals  RN instructed to call with any changes or concerns.   Event Summary:   MD Notified: NP- E. [facility] notified PTA Call Time: 2135 Arrival Time: 2137 End Time: 2145  Mordecai Rasmussen, RN

## 2022-03-01 NOTE — Progress Notes (Addendum)
Received a call from nurse reporting [name] reporting chest pain, EKG ordered.  She is refusing her Doxy and Lovenox, she was educated about the ramifications of non compliance via the nurse. Nurse states [name] verbalizes understanding.  Pharmacist was called and Doxy changed to IV, [name] stated she would take the Doxy IV. She is still refusing Lovenox. Called nurse still waiting for EKG to be transmitted in Epic. She verbalizes understanding and stated when [name] was complaining of chest pain, she call the Rapid Nurse. [name] told he rapid nurse she has had the chest pain for 2 days.  She stated to the nurse her pain has resolved, we will continue to monitor.

## 2022-03-01 NOTE — Progress Notes (Signed)
Patient with poor po intake and on multiple meds likely causing nausea. She also has high levels of anxiety with poor pain tolerance. Per WOC was getting atarax on acute for management-->will resume and premedicate with oxycodone and atarax prior to dressing changes. Was on IV toradol which could cause GI upset with poor po intake (refusing supplements). Throwing up phelgm--will try IV protonix X 3 doses then transition back to oral. Cymbalta was being refused on acute--now on 60 mg daily which she did take and high dose likely causing nausea.

## 2022-03-01 NOTE — Progress Notes (Signed)
Inpatient Rehabilitation Center Individual Statement of Services  Patient Name:  Paula Lucas  Date:  03/01/2022  Welcome to the Inpatient Rehabilitation Center.  Our goal is to provide you with an individualized program based on your diagnosis and situation, designed to meet your specific needs.  With this comprehensive rehabilitation program, you will be expected to participate in at least 3 hours of rehabilitation therapies Monday-Friday, with modified therapy programming on the weekends.  Your rehabilitation program will include the following services:  Physical Therapy (PT), Occupational Therapy (OT), Speech Therapy (ST), 24 hour per day rehabilitation nursing, Therapeutic Recreaction (TR), Neuropsychology, Care Coordinator, Rehabilitation Medicine, Nutrition Services, and Pharmacy Services  Weekly team conferences will be held on Wednesday to discuss your progress.  Your Inpatient Rehabilitation Care Coordinator will talk with you frequently to get your input and to update you on team discussions.  Team conferences with you and your family in attendance may also be held.  Expected length of stay: 10-12 days  Overall anticipated outcome: independent with device  Depending on your progress and recovery, your program may change. Your Inpatient Rehabilitation Care Coordinator will coordinate services and will keep you informed of any changes. Your Inpatient Rehabilitation Care Coordinator's name and contact numbers are listed  below.  The following services may also be recommended but are not provided by the Inpatient Rehabilitation Center:   Home Health Rehabiltiation Services Outpatient Rehabilitation Services Vocational Rehabilitation   Arrangements will be made to provide these services after discharge if needed.  Arrangements include referral to agencies that provide these services.  Your insurance has been verified to be:  Medicaid Your primary doctor is:  None  Pertinent  information will be shared with your doctor and your insurance company.  Inpatient Rehabilitation Care Coordinator:  Dossie Der, [place] [phone] or Luna [place]  Information discussed with and copy given to patient by: [name], 03/01/2022, 1:22 PM

## 2022-03-01 NOTE — Evaluation (Signed)
Physical Therapy Assessment and Plan  Patient Details  Name: Paula Lucas MRN: [id number] Date of Birth: 1990/10/22  PT Diagnosis: Abnormal posture, Abnormality of gait, Difficulty walking, Muscle weakness, and Pain in lower abdomen and pelvic  cavity Rehab Potential: Fair ELOS: 10-12 days   Today's Date: 03/01/2022 PT Individual Time: 1300-1320 PT Individual Time Calculation (min): 20 min   Today's Date: 03/01/2022 PT Missed Time: 25 Minutes Missed Time Reason: Patient ill (Comment);Pain (nausea)   Hospital Problem: Principal Problem:   Debility   Past Medical History:  Past Medical History:  Diagnosis Date   Asthma    BMI 31.0-31.9,adult    Chlamydia    Gonorrhea    TOA (tubo-ovarian abscess)    Past Surgical History:  Past Surgical History:  Procedure Laterality Date   INDUCED ABORTION     IR RADIOLOGIST EVAL & MGMT  05/17/2018   LAPAROTOMY N/A 02/16/2022   Procedure: EXPLORATORY LAPAROTOMY WITH ABDOMINAL WASHOUT;  Surgeon: Donnamae Jude, MD;  Location: [place];  Service: Gynecology;  Laterality: N/A;   SMALL BOWEL REPAIR N/A 02/16/2022   Procedure: SMALL BOWEL REPAIR;  Surgeon: Georganna Skeans, MD;  Location: [place];  Service: General;  Laterality: N/A;   TONSILLECTOMY      Assessment & Plan Clinical Impression: Patient is a 31 y.o. year old female with sepsis from left tubo-ovarian abscess and pyometrium.  Prior history of gonorrhea, chlamydia in 2019.  Was seen in the ED at [facility] on 02/04/2022.  Patient had vaginal bleeding pain and fever treated with Zosyn Septra trioxsalen doxycycline Flagyl and transferred to the ICU.  Initially was also complaining of bilateral lower extremity numbness.  Neurology consultation was obtained and symptoms were nonphysiologic.  A drainage catheter was placed by IR into the complex fluid collection that was initially 12 x 8 cm.  Was evaluated by OB/GYN but no surgery was recommended.  The patient checked out AMA on Father's Day to  visit her father's grave and then return to the hospital.  A new drain was placed.  General surgery was consulted on 02/15/2022.  CT of the abdomen showed perihepatic abscess.  Patient did go to the OR with OB/GYN, wound care was consulted postoperatively for wound VAC placement lower quadrant abdominal surgical incision midline.  Patient refused the wound VAC.  Wound care was consulted again on 02/23/2022 when patient agreed to have wound VAC placed.  Wound care has been following Monday Wednesdays Fridays.  Initial measurement of her sponge was 8 x 5 x 4 cm, most recent 8 x 4 x 3 cm.  Last PT note on 02/28/2022.  Ambulating min guard 175 feet. The patient did have a fever of 100 point 7 in the evening of 02/27/2022.  Medicine reevaluated patient, no need for change in antibiotic treatment.  Subsequent vitals have not had any fevers.  Patient currently requires  CGA  with mobility secondary to muscle weakness, decreased cardiorespiratoy endurance, and decreased standing balance, decreased postural control, and decreased balance strategies.  Prior to hospitalization, patient was independent  with mobility and lived with Family (lives with her mother) in a [place] home.  Home access is  Level entry.  Patient will benefit from skilled PT intervention to maximize safe functional mobility, minimize fall risk, and decrease caregiver burden for planned discharge home with 24 hour supervision.  Anticipate patient will benefit from follow up OP at discharge.  PT - End of Session Activity Tolerance: Tolerates 10 - 20 min activity with multiple  rests Endurance Deficit: Yes Endurance Deficit Description: due to nausea from antibiotics. unable to tolerate staying OOB or ambulating in room PT Assessment Rehab Potential (ACUTE/IP ONLY): Fair PT Barriers to Discharge: Home environment access/layout;Inaccessible home environment;Wound Care;Other (comments) PT Barriers to Discharge Comments: pain, nausea, woun vac, JP drains  x 2, 7 steps with 2 handrails in home. Unsure of exact D/C plan PT Patient demonstrates impairments in the following area(s): Balance;Behavior;Endurance;Pain;Skin Integrity;Other (comment) (nausea) PT Transfers Functional Problem(s): Bed Mobility;Bed to Chair;Car;Furniture PT Locomotion Functional Problem(s): Ambulation;Wheelchair Mobility;Stairs PT Plan PT Intensity: Minimum of 1-2 x/day ,45 to 90 minutes PT Frequency: 5 out of 7 days PT Duration Estimated Length of Stay: 10-12 days PT Treatment/Interventions: Ambulation/gait training;Discharge planning;Functional mobility training;Psychosocial support;Therapeutic Activities;Balance/vestibular training;Disease management/prevention;Neuromuscular re-education;Skin care/wound management;Therapeutic Exercise;Wheelchair propulsion/positioning;DME/adaptive equipment instruction;Cognitive remediation/compensation;Pain management;UE/LE Strength taining/ROM;Community reintegration;Patient/family education;Stair training;UE/LE Coordination activities PT Transfers Anticipated Outcome(s): Mod I PT Locomotion Anticipated Outcome(s): Mod I PT Recommendation Follow Up Recommendations: Outpatient PT Patient destination: Home Equipment Recommended: To be determined   PT Evaluation Precautions/Restrictions Precautions Precautions: Fall Precaution Comments: abd JP x2 drains, wound vac; watch HR/O2 Restrictions Weight Bearing Restrictions: No Pain Interference Pain Interference Pain Effect on Sleep: 2. Occasionally Pain Interference with Therapy Activities: 1. Rarely or not at all Pain Interference with Day-to-Day Activities: 1. Rarely or not at all Home Living/Prior [place] Living Arrangements: Alone Available Help at Discharge: Available 24 hours/day (mother) Type of Home: Apartment Home Access: Level entry Entrance Stairs-Rails:  (unsure) Home Layout: Two level Alternate Level Stairs-Number of Steps: 7 steps Alternate Level  Stairs-Rails: Left;Right Bathroom Shower/Tub: Chiropodist: Standard Bathroom Accessibility: Yes  Lives With: Family (lives with her mother) Prior Function Level of Independence: Independent with basic ADLs;Independent with transfers;Independent with homemaking with ambulation;Independent with gait  Able to Take Stairs?: Yes Driving: No Vocation: Full time employment 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 - History Ability to See in Adequate Light: 0 Adequate  Cognition Overall Cognitive Status: Within Functional Limits for tasks assessed Arousal/Alertness: Awake/alert Orientation Level: Oriented X4 Memory: Appears intact Awareness: Appears intact Problem Solving: Appears intact Safety/Judgment: Appears intact Sensation Sensation Light Touch: Appears Intact Hot/Cold: Appears Intact Proprioception: Appears Intact Stereognosis: Appears Intact Coordination Gross Motor Movements are Fluid and Coordinated: Yes Fine Motor Movements are Fluid and Coordinated: Yes Coordination and Movement Description: Increased nausea during evaluation. Patient with low energy level and decreased motivation Finger Nose Finger Test: [facility] bilaterally Heel Shin Test: WFL bilaterally but slow Motor  Motor Motor: Within Functional Limits  Trunk/Postural Assessment  Cervical Assessment Cervical Assessment: Within Functional Limits Thoracic Assessment Thoracic Assessment: Exceptions to [facility] (rounded shoulders) Lumbar Assessment Lumbar Assessment: Exceptions to [facility] (mild posterior pelvic tilt) Postural Control Postural Control: Within Functional Limits Trunk Control: Due to recent surgery, pt maintained a sitting posture while maintaining a position that prevented pain such as forward head, rounded shoulders, and posterior pelvic tilt  Balance Balance Balance Assessed: Yes Static Sitting Balance Static Sitting - Balance Support: No upper extremity  supported;Feet supported Static Sitting - Level of Assistance: 5: Stand by assistance (supervision) Static Standing Balance Static Standing - Balance Support: No upper extremity supported Static Standing - Level of Assistance: 5: Stand by assistance (CGA) Extremity Assessment RLE Assessment RLE Assessment: Exceptions to [facility] General Strength Comments: grossly generalized to 4/5 LLE Assessment LLE Assessment: Exceptions to [facility] General Strength Comments: grossly generalized to 4/5  Care Tool Care Tool Bed Mobility Roll left and right activity   Roll  left and right assist level: Supervision/Verbal cueing    Sit to lying activity   Sit to lying assist level: Supervision/Verbal cueing    Lying to sitting on side of bed activity   Lying to sitting on side of bed assist level: the ability to move from lying on the back to sitting on the side of the bed with no back support.: Supervision/Verbal cueing     Care Tool Transfers Sit to stand transfer   Sit to stand assist level: Contact Guard/Touching assist    Chair/bed transfer Chair/bed transfer activity did not occur: Copywriter, advertising transfer activity did not occur: Refused        Care Tool Locomotion Ambulation Ambulation activity did not occur: Refused        Walk 10 feet activity Walk 10 feet activity did not occur: Refused       Walk 50 feet with 2 turns activity Walk 50 feet with 2 turns activity did not occur: Refused      Walk 150 feet activity Walk 150 feet activity did not occur: Refused      Walk 10 feet on uneven surfaces activity Walk 10 feet on uneven surfaces activity did not occur: Refused      Stairs Stair activity did not occur: Refused        Walk up/down 1 step activity Walk up/down 1 step or curb (drop down) activity did not occur: Refused      Walk up/down 4 steps activity Walk up/down 4 steps activity did not occur: Refused      Walk up/down 12 steps  activity Walk up/down 12 steps activity did not occur: Refused      Pick up small objects from floor Pick up small object from the floor (from standing position) activity did not occur: Refused      Wheelchair Is the patient using a wheelchair?: No   Wheelchair activity did not occur: Refused      Wheel 50 feet with 2 turns activity Wheelchair 50 feet with 2 turns activity did not occur: Refused    Wheel 150 feet activity Wheelchair 150 feet activity did not occur: Refused      Refer to Care Plan for Long Term Goals  SHORT TERM GOAL WEEK 1 PT Short Term Goal 1 (Week 1): pt will transfer bed<>chair with LRAD and supervision PT Short Term Goal 2 (Week 1): pt will ambulate 54ft with LRAD and supervision PT Short Term Goal 3 (Week 1): pt will navigate 7 steps with 2 handrails and CGA  Recommendations for other services: None   Skilled Therapeutic Intervention Evaluation completed (see details above and below) with education on PT POC and goals and individual treatment initiated with focus on functional mobility/transfers and activity tolerance. Received pt sidelying in bed asleep, upon wakening pt reported pain 9/10 in lower abdomen and pelvic cavity (premedicated). Per OT, evaluation limited this morning due to nausea and pt reports nausea has not improved (thinks it is due to antibiotics). Pt appeared annoyed with therapist's presence stating "I just got back into bed" and initially attempting to refuse all mobility. With gentle encouragement, pt agreed to stand but refused any ambulation or staying OOB. Pt performed bed mobility with supervision and stood x1 without AD and CGA. Returned to sidelying and rolled to L to face away from therapist and closed eyes. Concluded session with pt sidelying in bed, needs  within reach, and bed alarm on. 55 minutes missed of skilled physical therapy due to nausea/pain.   Mobility Bed Mobility Bed Mobility: Rolling Right;Rolling Left;Sit to  Supine;Supine to Sit Rolling Right: Supervision/verbal cueing Rolling Left: Supervision/Verbal cueing Supine to Sit: Supervision/Verbal cueing Sit to Supine: Supervision/Verbal cueing Transfers Transfers: Sit to Stand;Stand to Sit Sit to Stand: Contact Guard/Touching assist Stand to Sit: Supervision/Verbal cueing Stand Pivot Transfers: Contact Guard/Touching assist Transfer (Assistive device): None Locomotion  Gait Ambulation: No (refused) Gait Gait: No (refused) Stairs / Additional Locomotion Stairs: No (refused) Wheelchair Mobility Wheelchair Mobility: No (refused)   Discharge Criteria: Patient will be discharged from PT if patient refuses treatment 3 consecutive times without medical reason, if treatment goals not met, if there is a change in medical status, if patient makes no progress towards goals or if patient is discharged from hospital.  The above assessment, treatment plan, treatment alternatives and goals were discussed and mutually agreed upon: by patient  Alfonse Alpers PT, DPT  03/01/2022, 1:33 PM

## 2022-03-01 NOTE — Progress Notes (Signed)
Inpatient Rehabilitation  Patient information reviewed and entered into eRehab system by Jari Dipasquale M. Jahmarion Popoff, M.A., CCC/SLP, PPS Coordinator.  Information including medical coding, functional ability and quality indicators will be reviewed and updated through discharge.    

## 2022-03-02 ENCOUNTER — Inpatient Hospital Stay (HOSPITAL_COMMUNITY): Payer: Medicaid Other

## 2022-03-02 ENCOUNTER — Inpatient Hospital Stay: Payer: Self-pay | Admitting: Internal Medicine

## 2022-03-02 DIAGNOSIS — R7989 Other specified abnormal findings of blood chemistry: Secondary | ICD-10-CM | POA: Diagnosis not present

## 2022-03-02 DIAGNOSIS — E8770 Fluid overload, unspecified: Secondary | ICD-10-CM | POA: Diagnosis not present

## 2022-03-02 DIAGNOSIS — R109 Unspecified abdominal pain: Secondary | ICD-10-CM

## 2022-03-02 DIAGNOSIS — R112 Nausea with vomiting, unspecified: Secondary | ICD-10-CM | POA: Diagnosis not present

## 2022-03-02 DIAGNOSIS — R5381 Other malaise: Secondary | ICD-10-CM | POA: Diagnosis not present

## 2022-03-02 DIAGNOSIS — R197 Diarrhea, unspecified: Secondary | ICD-10-CM

## 2022-03-02 DIAGNOSIS — K750 Abscess of liver: Secondary | ICD-10-CM

## 2022-03-02 LAB — CBC WITH DIFFERENTIAL/PLATELET
Abs Immature Granulocytes: 0.02 10*3/uL (ref 0.00–0.07)
Basophils Absolute: 0.1 10*3/uL (ref 0.0–0.1)
Basophils Relative: 1 %
Eosinophils Absolute: 0.3 10*3/uL (ref 0.0–0.5)
Eosinophils Relative: 4 %
HCT: 32.8 % — ABNORMAL LOW (ref 36.0–46.0)
Hemoglobin: 10.5 g/dL — ABNORMAL LOW (ref 12.0–15.0)
Immature Granulocytes: 0 %
Lymphocytes Relative: 32 %
Lymphs Abs: 2.6 10*3/uL (ref 0.7–4.0)
MCH: 28.8 pg (ref 26.0–34.0)
MCHC: 32 g/dL (ref 30.0–36.0)
MCV: 90.1 fL (ref 80.0–100.0)
Monocytes Absolute: 0.8 10*3/uL (ref 0.1–1.0)
Monocytes Relative: 10 %
Neutro Abs: 4.3 10*3/uL (ref 1.7–7.7)
Neutrophils Relative %: 53 %
Platelets: 607 10*3/uL — ABNORMAL HIGH (ref 150–400)
RBC: 3.64 MIL/uL — ABNORMAL LOW (ref 3.87–5.11)
RDW: 19.1 % — ABNORMAL HIGH (ref 11.5–15.5)
WBC: 8.1 10*3/uL (ref 4.0–10.5)
nRBC: 0 % (ref 0.0–0.2)

## 2022-03-02 LAB — COMPREHENSIVE METABOLIC PANEL
ALT: 71 U/L — ABNORMAL HIGH (ref 0–44)
AST: 44 U/L — ABNORMAL HIGH (ref 15–41)
Albumin: 2.9 g/dL — ABNORMAL LOW (ref 3.5–5.0)
Alkaline Phosphatase: 103 U/L (ref 38–126)
Anion gap: 11 (ref 5–15)
BUN: 19 mg/dL (ref 6–20)
CO2: 27 mmol/L (ref 22–32)
Calcium: 9.7 mg/dL (ref 8.9–10.3)
Chloride: 97 mmol/L — ABNORMAL LOW (ref 98–111)
Creatinine, Ser: 0.8 mg/dL (ref 0.44–1.00)
GFR, Estimated: >60 mL/min (ref >60)
Glucose, Bld: 96 mg/dL (ref 70–99)
Potassium: 3.8 mmol/L (ref 3.5–5.1)
Sodium: 135 mmol/L (ref 135–145)
Total Bilirubin: 0.5 mg/dL (ref 0.3–1.2)
Total Protein: 9.2 g/dL — ABNORMAL HIGH (ref 6.5–8.1)

## 2022-03-02 MED ORDER — ENOXAPARIN SODIUM 40 MG/0.4ML IJ SOSY
40.0000 mg | PREFILLED_SYRINGE | INTRAMUSCULAR | Status: DC
Start: 2022-03-02 — End: 2022-03-05
  Administered 2022-03-02 – 2022-03-03 (×2): 40 mg via SUBCUTANEOUS
  Filled 2022-03-02 (×3): qty 0.4

## 2022-03-02 MED ORDER — SENNOSIDES-DOCUSATE SODIUM 8.6-50 MG PO TABS: 2.0000 | ORAL_TABLET | Freq: Every evening | ORAL | Status: DC | PRN

## 2022-03-02 MED ORDER — GABAPENTIN 100 MG PO CAPS
200.0000 mg | ORAL_CAPSULE | Freq: Two times a day (BID) | ORAL | Status: DC
  Administered 2022-03-02: 200 mg via ORAL
  Filled 2022-03-02 (×4): qty 2

## 2022-03-02 MED ORDER — PANTOPRAZOLE SODIUM 40 MG IV SOLR
40.0000 mg | Freq: Two times a day (BID) | INTRAVENOUS | Status: AC
  Administered 2022-03-02 (×2): 40 mg via INTRAVENOUS
  Filled 2022-03-02 (×2): qty 10

## 2022-03-02 MED ORDER — GABAPENTIN 100 MG PO CAPS: 200.0000 mg | ORAL_CAPSULE | Freq: Three times a day (TID) | ORAL | Status: DC

## 2022-03-02 MED ORDER — IOHEXOL 300 MG/ML  SOLN
100.0000 mL | Freq: Once | INTRAMUSCULAR | Status: AC | PRN
  Administered 2022-03-02: 100 mL via INTRAVENOUS

## 2022-03-02 NOTE — Progress Notes (Signed)
Occupational Therapy Note  Patient Details  Name: DEONE OMAHONEY MRN: [id number] Date of Birth: 1990-10-21  Attempted to see patient for 30 minute makeup session. Pt declined to participate at this time due to pain. Pt reported she IS going to participate in her 2:15 session this afternoon. OT highly encouraged pt to participate in next session. Pt left semi-reclined in bed with needs met.  Daneen Schick Jewelz Ricklefs 03/02/2022, 11:50 AM

## 2022-03-02 NOTE — Progress Notes (Signed)
This morning patient refused boost, gabapentin and miralax. Cipriano Mile, PA notified.

## 2022-03-02 NOTE — Progress Notes (Signed)
Physical Therapy Session Note  Patient Details  Name: Paula Lucas MRN: [id number] Date of Birth: 1991/01/27  Today's Date: 03/02/2022 PT Individual Time: 2706-2376 and 2831-5176 PT Individual Time Calculation (min): 40 min and 16 min  Short Term Goals: Week 1:  PT Short Term Goal 1 (Week 1): pt will transfer bed<>chair with LRAD and supervision PT Short Term Goal 2 (Week 1): pt will ambulate 32ft with LRAD and supervision PT Short Term Goal 3 (Week 1): pt will navigate 7 steps with 2 handrails and CGA  Skilled Therapeutic Interventions/Progress Updates:   Session 1: Patient supine in bed on entrance to room. Patient alert and agreeable to PT session. RN in room to provide anti-nausea medication and start IV antibiotics. Pt relates that she wants to go "to the gym".   Patient with high level pain with all efforts during session.  Therapeutic Activity: Bed Mobility: Patient attempting roll to side with shoulders first causing rotation in spine and torsion in abdomen around drain sites and wound vac seal. Pain increases with trunk rotation. Pt attempts again with same result and then refuses to continue to turn to side. Verbal instructions and visual demonstration provided for log roll technique then push from sidelying to seated position. Pt unwilling to attempt d/t pain. But relates the need to toilet. Encouraged pt to attempt technique to get to sitting to setup for transfer to [facility]. Pt unwilling and states she will "just hold it".  Educated on need to urinate by 3rd urge. Pt relates she has already postponed other urges. Retrieved bedpan for pt as only other option. Pt educated and cued on log roll technique in order to place bed pan. Completes roll to R side with Mod/ MaxA.  Pt able to urinate continently. SETUP required for pericare with washcloths.   Pt able to push with BLE with Mod/ MaxA to move toward [facility].   Education provided on need to participate in therapy to improve overall  pain over time and acutely, harmful pain vs healing pain, need to push through pain as medication will not erase all pain, need for sleep to heal, need for proper nutrition and calorie intake in order to promote healing and provide more energy for participation in therapy. Improved outcomes expected with pain and movement.   Pt unwilling to further participate at this time. Oriented pt to time and time of remaining therapy sessions.  Patient supine  in bed at end of session with brakes locked, bed alarm set, and all needs within reach.  Session 2: Patient supine in bed on entrance to room. Patient initially asleep and complaining of not getting enough sleep, and pain. Refusing PT session.   Therapeutic Activity: Education provided to pt re: pain mgmt. Recommended pain medication with small bit of food in order to decrease nausea from pain meds ingested alone. Recommended at start of session in order to begin to work by end of session and allow for sleep until next session at 2:15 with OT. Pt refuses stating that it will make her fall asleep in therapy. Informed pt that pain meds do not work that fast. Pt states that theydo "work that fast" on her. Stronger encouragement to take pain meds now and calling out pt on contradiction re: sleep/ rest. Pt agreeable to take pain medication but not to get OOB. Informed RN as to pt's request for pain medication and RN suggesting to take with higher protein yogurt.   Discussion with care team re: pt's refusal to  work with therapy since eval. Team deciding to reduce pt to qd sessions to allow for more sleep and increased potential to participate in the short term. Then bump back to 15/7 ASAP.   Patient supine  in bed at end of session with brakes locked, bed alarm set, and all needs within reach.  Pt missed of skilled therapy due to pain/ fatigue. Will re-attempt as schedule and pt availability permits.   Therapy Documentation Precautions:   Precautions Precautions: Fall Precaution Comments: abd JP x2 drains, wound vac; watch HR/O2 Restrictions Weight Bearing Restrictions: No General:   Vital Signs: Therapy Vitals Temp: 98.4 F (36.9 C) Pulse Rate: (!) 108 Resp: 18 BP: 101/74 Patient Position (if appropriate): Lying Oxygen Therapy O2 Device: Nasal Cannula O2 Flow Rate (L/min): 2 L/min Pain: Pain Assessment Pain Scale: 0-10 Pain Score: 9  Pain Type: Acute pain Pain Orientation: Right;Left Pain Descriptors / Indicators: Discomfort Pain Onset: On-going Pain Intervention(s): Emotional support;Distraction  Therapy/Group: Individual Therapy  Loel Dubonnet PT, DPT, CSRS 03/02/2022, 10:24 AM

## 2022-03-02 NOTE — Progress Notes (Addendum)
PROGRESS NOTE   Subjective/Complaints: Rapid response yesterday for chest pain.  EKG sinus tach, CP resolved.  Pt requested IV pain medications.  This AM she reports her SOB and nausea have improved.  She is refusing pain medications and several other meds. Discussed importance of taking medications as directed.    Review of Systems  Constitutional:  Negative for chills and fever.  Respiratory:  Positive for shortness of breath.   Cardiovascular:  Negative for chest pain and palpitations.  Gastrointestinal:  Positive for abdominal pain and nausea.  Neurological:  Positive for weakness.      Objective:   DG Chest 2 View  Result Date: 03/01/2022 CLINICAL DATA:  Pleural effusion. PICC line position. Low-grade fever. EXAM: CHEST - 2 VIEW COMPARISON:  AP chest 02/27/2022; CT chest 02/22/2022 FINDINGS: Interval repositioning of the patient's right upper extremity PICC which previously cross the midline and terminated over the left brachiocephalic vein. This right upper extremity PICC tip now appropriately descends into the superior vena cava with the tip overlying the mid to inferior aspect of the superior vena cava. Cardiac silhouette and mediastinal contours are within normal limits. Mildly improved aeration of the right mid lung. Mild bilateral costophrenic angle opacities may represent right-greater-than-left pleural effusions with associated airspace opacity similar to prior. No pneumothorax is seen. No acute skeletal abnormality. IMPRESSION: 1. Interval repositioning of right upper extremity PICC with the tip now appropriately overlying the mid to inferior aspect of the superior vena cava. 2. Mildly improved aeration of the right midlung. Likely small right-greater-than-left pleural effusions with associated atelectasis versus pneumonia, similar to prior radiographs and 02/22/2022 chest CT. Electronically Signed   By: Yvonne Kendall M.D.    On: 03/01/2022 12:39   Recent Labs    02/28/22 0600 03/01/22 0408  WBC 7.9 9.0  HGB 9.4* 9.9*  HCT 29.4* 30.6*  PLT 522* 572*    Recent Labs    03/01/22 0408  NA 135  K 3.5  CL 98  CO2 24  GLUCOSE 88  BUN 16  CREATININE 0.86  CALCIUM 9.6     Intake/Output Summary (Last 24 hours) at 03/02/2022 0735 Last data filed at 03/02/2022 0500 Gross per 24 hour  Intake 230 ml  Output 10 ml  Net 220 ml         Physical Exam: Vital Signs Blood pressure 108/75, pulse (!) 110, temperature 98.6 F (37 C), resp. rate 18, height 5\' 3"  (1.6 m), weight 67.6 kg, last menstrual period 10/26/2021, SpO2 100 %, unknown if currently breastfeeding.  Physical Exam Vitals and nursing note reviewed.  Constitutional:      Appearance: She is normal weight.  HENT:     Head: Normocephalic and atraumatic.  Eyes:     Extraocular Movements: Extraocular movements intact.     Conjunctiva/sclera: Conjunctivae normal.     Pupils: Pupils are equal, round, and reactive to light.  Cardiovascular:     Rate and Rhythm: Normal rate and regular rhythm.     Heart sounds: Normal heart sounds.  Pulmonary:     Effort: Pulmonary effort is normal. No respiratory distress.     Breath sounds: Normal breath sounds. No stridor.  [place] 2L 02 Abdominal:     General: Bowel sounds are normal. There is distension.     Tenderness: There is abdominal tenderness.  Musculoskeletal:     Cervical back: Normal range of motion.     Comments: No pain with upper extremity or lower extremity range of motion  Skin:    Comments: Right lower quadrant drains x2, midline wound VAC, no surrounding erythema, tenderness around drains PICC line right upper arm nontender no erythema  Neurological:     Mental Status: She is alert and oriented to person, place, and time.     Cranial Nerves: Cranial nerves 2-12 are intact. No dysarthria.     Comments: Motor strength is 4/5 bilateral hip flexor knee extensor ankle dorsiflexor 5/5 bilateral  deltoid bicep tricep grip Sensation intact light touch bilateral upper and lower limbs Negative straight leg raising     Assessment/Plan: 1. Functional deficits which require 3+ hours per day of interdisciplinary therapy in a comprehensive inpatient rehab setting. Physiatrist is providing close team supervision and 24 hour management of active medical problems listed below. Physiatrist and rehab team continue to assess barriers to discharge/monitor patient progress toward functional and medical goals  Care Tool:  Bathing    Body parts bathed by patient: Face   Body parts bathed by helper: Left arm, Right arm, Chest, Abdomen, Front perineal area, Buttocks, Right upper leg, Left upper leg, Right lower leg, Left lower leg     Bathing assist Assist Level: Total Assistance - Patient < 25%     Upper Body Dressing/Undressing Upper body dressing   What is the patient wearing?: Hospital gown only    Upper body assist Assist Level: Total Assistance - Patient < 25%    Lower Body Dressing/Undressing Lower body dressing      What is the patient wearing?: Hospital gown only     Lower body assist Assist for lower body dressing: Dependent - Patient 0%     Toileting Toileting    Toileting assist Assist for toileting: Total Assistance - Patient < 25%     Transfers Chair/bed transfer  Transfers assist  Chair/bed transfer activity did not occur: Refused  Chair/bed transfer assist level: Contact Guard/Touching assist     Locomotion Ambulation   Ambulation assist   Ambulation activity did not occur: Refused          Walk 10 feet activity   Assist  Walk 10 feet activity did not occur: Refused        Walk 50 feet activity   Assist Walk 50 feet with 2 turns activity did not occur: Refused         Walk 150 feet activity   Assist Walk 150 feet activity did not occur: Refused         Walk 10 feet on uneven surface  activity   Assist Walk 10 feet on  uneven surfaces activity did not occur: Refused         Wheelchair     Assist Is the patient using a wheelchair?: No   Wheelchair activity did not occur: Refused         Wheelchair 50 feet with 2 turns activity    Assist    Wheelchair 50 feet with 2 turns activity did not occur: Refused       Wheelchair 150 feet activity     Assist  Wheelchair 150 feet activity did not occur: Refused       Blood pressure 108/75, pulse (!) 110, temperature  98.6 F (37 C), resp. rate 18, height 5\' 3"  (1.6 m), weight 67.6 kg, last menstrual period 10/26/2021, SpO2 100 %, unknown if currently breastfeeding.   Medical Problem List and Plan: 1. Functional deficits secondary to debility resulting from pelvic abscesses On IV Flagyll 500mg  po q12h and Ceftriaxone 2g qd until 7/18, Doxycycline 100mg  po BID through 7/11             -patient may not shower             -ELOS/Goals: 7 to 10 days mod I goals  -PT/OT/SLP evals -7/10 Picc Line replaced today, Flagyl changed to IV, She was discussed with ID -She is declining pain meds, however declining therapy due to pain -7/11 IR to see her today, CMP, CBC,ammonia today 2.  Antithrombotics: -DVT/anticoagulation:  Lovenox 40mg  [place] daily          -antiplatelet therapy: NA 3. Pain Management: fentanyl patch , oxyIR 5mg  Q8 prn, gabapentin 300mg  TID, flexeril 5mg  TID prn  -7/11 Gabapentin to 200mg  TID 4. Mood/Sleep: cymbalta 60mg  qd which patient has been refusing             -antipsychotic agents:   -7/10 Cymbalta stopped due to possible nausea 5. Neuropsych/cognition: This patient is capable of making decisions on her own behalf.  Of note is that the pt signed out AMA ~3wks ago  6. Skin/Wound Care: WOC consult for wound vac , WOC following q M-W-F on acute  7. Fluids/Electrolytes/Nutrition: check CMET in am  8.  Migraines - has had IV compazine and benadryl in past, consider triptan  9.  Constipation dulc supp, Senokot 1 p.o.  nightly, MiraLAX twice daily 10.  GERD/nausea- protonix, mylicon 80mg  po q 6h prn, Zofran 4 mg IV or p.o. every 6 hours as needed -7/10 protonix changed to IV 11.  Insomnia trazodone 50mg  qhs 12. Elevated LFT  -hx of liver abscess, continue to monitor  -CMP today 13. Shortness of breath, on O2  -Likely related to fluid overload, she had been getting IV lasix, will continue  -Reports improved today 14. Loose Bms  -DC miralax, senna to PRN   LOS: 2 days A FACE TO FACE EVALUATION WAS PERFORMED  9/11 03/02/2022, 7:35 AM

## 2022-03-02 NOTE — Progress Notes (Addendum)
Occupational Therapy Session Note  Patient Details  Name: Paula Lucas MRN: [id number] Date of Birth: 1991-02-07  Today's Date: 03/02/2022 OT Individual Time: 3419-6222 & 1415 - 1540  OT Individual Time Calculation (min): 60 min & 85 min   Short Term Goals: Week 1:  OT Short Term Goal 1 (Week 1): Patient will report a decrease in pain level to 6/10 while utilizing pain management techniques in order to participate in all therapy sessions throughout the day. OT Short Term Goal 2 (Week 1): Patient will complete all bathing and dressing at [facility] Assist while utilizing pain management techniques. OT Short Term Goal 3 (Week 1): Patient will complete all aspects of toileting at Supervision level.  First Visit -- Skilled Therapeutic Interventions/Progress Updates:  Pt asleep in bed upon OT arrival to the room. Pt reports, "I want to do that later," referring to ADLs. Pt asked, "Can't we just talk?" Pt in agreement for  educational OT session.   Therapy Documentation Precautions:  Precautions Precautions: Fall Precaution Comments: abd JP x2 drains, wound vac; watch HR/O2 Restrictions Weight Bearing Restrictions: No Vital Signs (most recent vitals charted by nursing): Therapy Vitals Temp: 98.4 F (36.9 C) Pulse Rate: (!) 108 Resp: 18 BP: 101/74 Patient Position (if appropriate): Lying Oxygen Therapy O2 Device: Nasal Cannula O2 Flow Rate (L/min): 2 L/min  Pt had 2L O2 via [place] throughout session and SPO2 remained >92%. Pain: Pain Assessment Pain Scale: 0-10 Pain Score: 9  Pain Type: Acute pain Pain Orientation: Right;Left Pain Descriptors / Indicators: Discomfort Pain Onset: On-going Pain Intervention(s): Emotional support;Distraction  ADL: Pt declines ADLs and functional mobility at this time secondary to high pain levels (9/10). Pt in agreement for education regarding pain management techniques and AE to aid in pain management with ADLs. Various attempts by OT provided to  encourage pt to participate in functional mobility and ADLs, however, pt adamantly continued to decline at this time.    Pain Management Education: Education provided to pt of various pain management techniques in order to aid in pain control and improve participation in ADLs/mobility. OT provided education on diaphragmatic breathing, importance of performing even small amounts of mobility consistently throughout the day with rest breaks to improve pain tolerance, and importance of participation in therapy sessions. Education provided to pt on the importance of staying within the tolerable pain threshold in order to participate in therapy sessions. Explain what the pain threshold is and techniques that can help support this (pain medication, breathing techniques, position, and consistent mobility). Pt verbalizes understanding of pain management education, however, consistently closed eyes and continued to decline mobility/ADLs after this education.    AE/DME Education with ADLs: Education and handout provided to pt on potential need for AE/DME needed to improve independence and assist with pain management with ADLs after DC. Provided handout with pictures of equipment and education on AE/DME such as reacher, LH-sponge, sock aid, and tub-transfer bench that can assist with managing pain with ADLs that involve reading BLE and for transfers to decrease fall risks. Encouraged pt to talk to family about recommended equipment so family can assist pt with developing a plan to obtain recommended equipment prior to DC. Educated pt on how decreased participation can impact pt's safe DC plan and encouraged participation in ADLs/mobility. Pt continues to decline. Pt verbalizes understanding of education provided.   Rehab Process, Pt's Role in Rehab Process, & OT Role: Education provided to pt on the required therapy minutes during rehab admission. Education provided to pt  on how decreased participation can impact pt's  continued admission and/or DC plan. Provided education on pt's role in the rehab process of participating in therapy, assisting with problem solving with therapist/care team on techniques to improve participation, and participating in DC planning. Educated pt on weekly team conferences and how the multi-disciplinary team approach assists with preparing pt for safe DC back to home. Pt verbalizes understanding.  Pt remained in bed at end of session. Pt left resting comfortably in bed with personal belongings and call light within reach, bed alarm on and activated, bed in low position, 3 bed rails up, and comfort needs attended to.   Second Visit -- Skilled Therapeutic Interventions/Progress Updates:  Pt awake in bed upon OT arrival to the room. Pt reports, "I want to go to the gym." Pt in agreement for OT session. IV team present to disconnect IV. Pt participates in ADLs and therapeutic activity in order to improve independence with functional mobility/ADLs and improve participation in therapy.  Vital Signs: Please see "Flowsheet" for most recent vitals charted by nursing staff. Pain: No pain.  ADL:  03/02/22 1633  ADL  Upper Body Dressing Setup (Pt able to don a pull-over shirt with set-up assist for item retrieval while seated in the w/c.)  Where Assessed-Upper Body Dressing Chair  Lower Body Dressing Maximal assistance (Pt requires assistance with 2/3 portions of task to don pants. pt requires assistance to thread BLE through pants. However, is able ot pull pants up while standing with CGA for safety.)  Where Assessed-Lower Body Dressing Wheelchair (& standing)  Toileting Contact guard (Pt able to complete toileting hygiene with CGA for standing balance after voiding bladder. However, pt reports increased difficulty with hygiene after a BM. Education provided to pt on comp techniques to improve independence with task.)  Where Assessed-Toileting Financial controller  guard (Pt able to complete a step-pivot transfer from EOB > BSC for CGA for safety and no AD.)  Toilet Transfer Method Stand pivot  Toilet Transfer Equipment Bedside commode  ADL Comments Pt participates well in ADL session today. Pt reports increased discomfort with sit <> stand transitions. Pt reports increased difficulty with toileting hygiene after a BM. OT provides education on positioning and comp techniques to improve independence with toileting hygiene. Education provided to pt and verbally introduced AE for LB dressing to improve independence with dressing tasks and reported plan to address in future OT sessions. OT also provided a visual and demo on technique to [employer] a tub-transfer bench in the tub room to improve pt's understanding of DME that potentially will be recommended for DC. Pt verbalizes understanding. Pt able to complete a step-pivot transfer from EOB > BSC > w/c > bed with CGA for safety and no AD.    Therapeutic Activity: Pt participates in therapeutic activity once transferred to w/c. OT provides a tour of the rehab facility and provides education on the rehab process. Educated pt on future plans for OT sessions such as practicing tub-transfers with DME prior to DC and education on certain pieces of equipment and how this aids in DC planning. During this tour, OT provides education on the importance of pt's role in the rehab process and importance of continued mobility to aid in pain management, progression with mobility/ADLs, and to aid in DC planning. Pt verbalizes understanding and participates well.   Stair Management: Pt participates in stair training in order to improve safety with stair navigation. Pt reports that bedroom and bathroom are  located on the 2nd floor and pt has to navigate a full flight of stairs to get to these rooms. Pt does report that mom's bathroom is downstairs and this bathroom can be used on the main level if needed. Pt able to ascend/descend the 3"  stairs with CGA for safety and use of B hand rails. Pt reports increased fatigue after task, however, this improves with a seated rest break. Education provided to pt on importance of continued stair training with PT to improve endurance, strength, and independence with task needed prior to DC. Pt verbalizes understanding. Pt assisted back to bed after stair task.   Pt returned to bed at end of session. Pt left resting comfortably in bed with personal belongings and call light within reach, bed alarm on and activated, bed in low position, 3 bed rails up, and comfort needs attended to.    Therapy/Group: Individual Therapy  Lavera Guise 03/02/2022, 9:33 AM

## 2022-03-02 NOTE — Progress Notes (Addendum)
Patient ID: Paula Lucas, female   DOB: [birth year], 31 y.o.   MRN: [id number]  Touched base with pt she did eat some lunch and is smiling. Geoffery Spruce is working with her and has had her agree to go to therapy at 2:15 today. Pt needs much support and pushing to do. Will continue to check to see if Mom here this worker would like to touch base with her. Have not been able to reach by phone

## 2022-03-03 ENCOUNTER — Telehealth: Payer: Self-pay | Admitting: *Deleted

## 2022-03-03 DIAGNOSIS — R0602 Shortness of breath: Secondary | ICD-10-CM

## 2022-03-03 DIAGNOSIS — R7989 Other specified abnormal findings of blood chemistry: Secondary | ICD-10-CM | POA: Diagnosis not present

## 2022-03-03 DIAGNOSIS — E8770 Fluid overload, unspecified: Secondary | ICD-10-CM | POA: Diagnosis not present

## 2022-03-03 DIAGNOSIS — R5381 Other malaise: Secondary | ICD-10-CM | POA: Diagnosis not present

## 2022-03-03 DIAGNOSIS — K750 Abscess of liver: Secondary | ICD-10-CM | POA: Diagnosis not present

## 2022-03-03 MED ORDER — METRONIDAZOLE 500 MG PO TABS
500.0000 mg | ORAL_TABLET | Freq: Two times a day (BID) | ORAL | Status: DC
  Administered 2022-03-03: 500 mg via ORAL
  Filled 2022-03-03 (×2): qty 1

## 2022-03-03 MED ORDER — SODIUM CHLORIDE 0.9 % IV SOLN
2.0000 g | INTRAVENOUS | Status: AC
  Administered 2022-03-03: 2 g via INTRAVENOUS
  Filled 2022-03-03: qty 20

## 2022-03-03 MED ORDER — HYDROMORPHONE HCL 1 MG/ML IJ SOLN
0.5000 mg | Freq: Once | INTRAMUSCULAR | Status: AC
  Administered 2022-03-03: 0.5 mg via INTRAVENOUS
  Filled 2022-03-03: qty 1

## 2022-03-03 MED ORDER — AMOXICILLIN-POT CLAVULANATE 875-125 MG PO TABS
1.0000 | ORAL_TABLET | Freq: Two times a day (BID) | ORAL | Status: DC
  Administered 2022-03-04 – 2022-03-05 (×3): 1 via ORAL
  Filled 2022-03-03 (×5): qty 1

## 2022-03-03 MED ORDER — GABAPENTIN 100 MG PO CAPS
100.0000 mg | ORAL_CAPSULE | Freq: Two times a day (BID) | ORAL | Status: DC
  Administered 2022-03-03: 100 mg via ORAL
  Filled 2022-03-03: qty 1

## 2022-03-03 NOTE — Progress Notes (Signed)
Occupational Therapy Session Note  Patient Details  Name: Paula Lucas MRN: [id number] Date of Birth: [birth year]  Today's Date: 03/03/2022 OT Individual Time: 0900-0930 OT Individual Time Calculation (min): 30 min    Short Term Goals: Week 1:  OT Short Term Goal 1 (Week 1): Patient will report a decrease in pain level to 6/10 while utilizing pain management techniques in order to participate in all therapy sessions throughout the day. OT Short Term Goal 2 (Week 1): Patient will complete all bathing and dressing at [place] while utilizing pain management techniques. OT Short Term Goal 3 (Week 1): Patient will complete all aspects of toileting at Supervision level.  Skilled Therapeutic Interventions/Progress Updates:    Pt had just finished receiving wound care for wound vac and stated her abdomen was really sore and she needed 15 min to lay still to adjust. During that time, discussed the level of A and S she has at home, equipment needs (shower seat?), level of A she is requiring now. Pt stated that when her abdomen is not so sore she has been able to get dressed, wash up at sink with no A. She stated she has completed bed >< BSC transfers without A.  (Supervison from nursing).    Pt is VERY eager to go home on Friday. She stated she has managed JP drains in the past when she was 21.  Wound vac should be off by Friday. Pt even stated she could take oral antibiotics instead of IV.  Will need to discuss this with medical team in conference.  Pt started to sit up to change her pants but as she sat up her abdomen was in too much pain. "I just cant do it right now".  I asked her to do it with the PT in her session in 1 hour from now.   Discussed with pt how team would need to discuss if she would be ready for Friday and pt stated " I AM going to leave then bc I am not going to miss my niece's party. I will be ready to go".    Pt feels confident in her physical ability.   Discussed her  personal challenges (losing her husband 3 yrs ago and a twin brother when they were children).  Pt resting in bed with all needs met and alarm set.    Therapy Documentation Precautions:  Precautions Precautions: Fall Precaution Comments: abd JP x2 drains, wound vac; watch HR/O2 Restrictions Weight Bearing Restrictions: No    Vital Signs: Therapy Vitals Temp: 98.4 F (36.9 C) Pulse Rate: (!) 104 Resp: 17 BP: 107/70 Patient Position (if appropriate): Lying Oxygen Therapy SpO2: 92 % O2 Device: Room Air Pain:  10/10 abd pain after wound care, received pain medication     Therapy/Group: Individual Therapy  [place] 03/03/2022, 8:27 AM

## 2022-03-03 NOTE — Patient Care Conference (Signed)
Inpatient RehabilitationTeam Conference and Plan of Care Update Date: 03/03/2022   Time: 12:29 PM    Patient Name: Paula Lucas      Medical Record Number: [id number]  Date of Birth: 19-Aug-1991 Sex: Female         Room/Bed: 4M06C/4M06C-01 Payor Info: Payor: MEDICAID [place] / Plan: MEDICAID OF [place] / Product Type: *No Product type* /    Admit Date/Time:  02/28/2022  2:40 PM  Primary Diagnosis:  Debility  Hospital Problems: Principal Problem:   Debility Active Problems:   Hepatic abscess   Hx of Fitz-Hugh-Curtis syndrome   Protein-calorie malnutrition (HCC)    Expected Discharge Date: Expected Discharge Date: 03/06/22 (or 7/19)  Team Members Present: Physician leading conference: Dr. Fanny Dance Social Worker Present: Dossie Der, LCSW Nurse Present: Chana Bode, RN PT Present: Raechel Chute, PT OT Present: Primitivo Gauze, OT     Current Status/Progress Goal Weekly Team Focus  Bowel/Bladder     continent        Swallow/Nutrition/ Hydration             ADL's   CGA  Mod I  ADL training, functional mobility, strength, pt/fam ed   Mobility   DIfficult to  assess as she has been self limiting d/t pain and n/v since eval. Bed mobility anywhere from supervision to MaxA d/t pain. Sit<>stand on eval with sup/ CGA but has not stood since. No ambulation with PT.  overall ModI/ supervision  PARTICIPATION, continued education and encouragement to take pain meds with food for better participation with therapy, pain education, gait training, stair training, family/ caregiver education. Need d/c update on location and amount of care available   Communication             Safety/Cognition/ Behavioral Observations            Pain     Pain in abd with dressing changes and discomfort from drains, incision   Pain managed with prn meds   Assess need for and effectiveness of medications  Skin     JP x2, wound vac to abd   W-D dressing to abd, JP x2 for discharge   Patient and family  able to manage care at discharge    Discharge Planning:  Suppose to go home with MOm who is recovring from a CVA herself, she can not provide assist to pt. Pt limited in her participation in therapies. Wound issues will be difficult to find [facility] agency to accept due to her insurance. Still have not reached Mom to verify DC plan   Team Discussion: Patient is not tolerating dressing changes without IV medication. Reports po meds cause nausea and vomiting. Poor appetite despite regular diet options provided. Also note pleural effusion; MD placed on lasix and monitoring LFTs.  Currently on IV abx through 03/09/22 and wound vac to be removed 03/05/22; trial w-d dressing. G  Patient on target to meet rehab goals: Currently; patient with limited participation with therapy. Had a good afternoon yesterday and was able to eat. Needs supervision for ADLs and able to ambulate up to 50'. Needs CGA for steps. oals for discharge set for mod I overall. *See Care Plan and progress notes for long and short-term goals.   Revisions to Treatment Plan:  MD to double check options for po vs IV abx Wound vac to be removed 03/05/22 and apply wet-dry dressing    Teaching Needs: JP drain care, skin care, medications, dietary modifications, transfers, etc  Current Barriers to  Discharge: Home enviroment access/layout, IV antibiotics, Wound care, Lack of/limited family support, and Behavior  Possible Resolutions to Barriers: Change meds to po route D/c wound vac and change to dressing     Medical Summary Current Status: shortness of breath, LFTs elevated, constipation, Azotemia, wounds, pain  Barriers to Discharge: Medical stability;Decreased family/caregiver support;Home enviroment access/layout  Barriers to Discharge Comments: shortness of breath, LFTs elevated, constipation, Azotemia, wounds, pain Possible Resolutions to [employer] Focus: pain medications, CT abdomen, continue abx, monitor labs   Continued  Need for Acute Rehabilitation Level of Care: The patient requires daily medical management by a physician with specialized training in physical medicine and rehabilitation for the following reasons: Direction of a multidisciplinary physical rehabilitation program to maximize functional independence : Yes Medical management of patient stability for increased activity during participation in an intensive rehabilitation regime.: Yes Analysis of laboratory values and/or radiology reports with any subsequent need for medication adjustment and/or medical intervention. : Yes   I attest that I was present, lead the team conference, and concur with the assessment and plan of the team.   Chana Bode B 03/03/2022, 4:06 PM

## 2022-03-03 NOTE — Consult Note (Signed)
WOC Nurse Consult Note: Patient receiving care in [facility] 4M06 Refused wound care this AM until she could get IV pain meds. This NPWT will be discontinued before she is discharged Reason for Consult: NPWT dressing changes Wound type: midline LQ abdominal surgical incision.  Pressure Injury POA: NA Wound bed: pink, red healthy granulation tissue, healing very well with minimal drainage.  Drainage (amount, consistency, odor) serosanguinous in canister with minimal output Periwound: intact Dressing procedure/placement/frequency: Adhesive remover spray used to remove drape. Removed 1 piece black foam, 1 piece mepitel and replaced with 1 piece of black foam and mepitel. Drape applied, immediate suction obtained at 125 mmHG. Replaced the Tegaderm around the two drains next to her wound.    WOC will follow MWF @ 8:30 for dressing changes.    Paula Reel Katrinka Blazing, MSN, RN, CMSRN, Angus Seller, [facility] Wound Treatment Associate Pager [phone]

## 2022-03-03 NOTE — Progress Notes (Signed)
Physical Therapy Session Note  Patient Details  Name: Paula Lucas MRN: [id number] Date of Birth: [birth year]  Today's Date: 03/03/2022 PT Individual Time: 1035-1100 PT Individual Time Calculation (min): 25 min   Short Term Goals: Week 1:  PT Short Term Goal 1 (Week 1): pt will transfer bed<>chair with LRAD and supervision PT Short Term Goal 2 (Week 1): pt will ambulate 49f with LRAD and supervision PT Short Term Goal 3 (Week 1): pt will navigate 7 steps with 2 handrails and CGA  Skilled Therapeutic Interventions/Progress Updates: Pt presented in bed sleeping but easily aroused and agreeable to therapy. Pt states pain 7/10, premedicated but had wound vac change earlier this am. Rest breaks and increased time provided throughout session. Session focused on functional mobility and clothing management per OT request. Pt performed supine to sit with supervision and upon sitting noted urgent need for bathroom. Performed sit to stand with ambulated to toilet ~191fwith supervision and use of RW. Pt was supervision for toilet transfers with continent urinary void. Pt was able to doof scrub pants and don leggings with overall supervision. Pt initially with increased difficulty pulling pants over feet but PTA removed socks and pt was able to complete easily. Pt returned to bed in same manner and with increased time/effort was able to return to supine with supervision and only HOB slightly elevated. Pt left in bed at end of session with bed alarm on, call bell within reach and needs met.      Therapy Documentation Precautions:  Precautions Precautions: Fall Precaution Comments: abd JP x2 drains, wound vac; watch HR/O2 Restrictions Weight Bearing Restrictions: No General:   Vital Signs: Therapy Vitals Temp: 98.4 F (36.9 C) Pulse Rate: (!) 104 Resp: 17 BP: 107/70 Patient Position (if appropriate): Lying Oxygen Therapy SpO2: 92 % O2 Device: Room Air Pain:   Mobility:   Locomotion :     Trunk/Postural Assessment :    Balance:   Exercises:   Other Treatments:      Therapy/Group: Individual Therapy  Leman Martinek 03/03/2022, 11:01 AM

## 2022-03-03 NOTE — Progress Notes (Signed)
PROGRESS NOTE   Subjective/Complaints: Wound vac change today, she was given IV dilaudid 0.5 before change.    Review of Systems  Constitutional:  Negative for chills and fever.  Respiratory:  Positive for shortness of breath.   Cardiovascular:  Negative for chest pain and palpitations.  Gastrointestinal:  Positive for abdominal pain and nausea.  Neurological:  Positive for weakness.      Objective:   CT ABDOMEN PELVIS W CONTRAST  Result Date: 03/02/2022 CLINICAL DATA:  Follow-up intra-abdominal abscess. Tubo-ovarian abscess status post drain. Now with low output. EXAM: CT ABDOMEN AND PELVIS WITH CONTRAST TECHNIQUE: Multidetector CT imaging of the abdomen and pelvis was performed using the standard protocol following bolus administration of intravenous contrast. RADIATION DOSE REDUCTION: This exam was performed according to the departmental dose-optimization program which includes automated exposure control, adjustment of the mA and/or kV according to patient size and/or use of iterative reconstruction technique. CONTRAST:  OMNIPAQUE IOHEXOL 300 MG/ML  SOLN COMPARISON:  Most recent CT 02/14/2022 FINDINGS: Lower chest: Moderate-sized right pleural effusion which has increased from prior exam. Small left pleural effusion. Associated compressive atelectasis. Hepatobiliary: The lentiform fluid collection adjacent to the anterior dome of the liver has diminished in size measuring 3.5 x 0.7 cm, series 3, image 9, previously 5.7 x 1.1 cm. There is no peripheral enhancement. Decreased size of the peripherally enhancing thick wall perihepatic collection adjacent to the inferior liver tip, 4.6 x 1.1 x 3.5 cm, previously 9.5 x 3.1 x 3.5 cm. There is no intrahepatic collection or focal hepatic lesion. Unremarkable gallbladder. Pancreas: Unremarkable. No pancreatic ductal dilatation or surrounding inflammatory changes. Spleen: Normal in size  without focal abnormality. Adrenals/Urinary Tract: Normal adrenal glands. No hydronephrosis, renal calculi, or focal abnormality. Unremarkable urinary bladder. Stomach/Bowel: No bowel obstruction or inflammation. Normal appendix tentatively visualized. Vascular/Lymphatic: Normal caliber abdominal aorta. No acute vascular findings. Increased number of small retroperitoneal and central mesenteric lymph nodes, likely reactive. Reproductive: Pelvic fluid collections as described below. The uterus is unremarkable. The ovaries are not well-defined. Other: 2 drains into the right anterior pelvis. The more superior drain courses superiorly and terminates in the upper abdomen. There is no fluid collection adjacent to the drain. The more inferior drain courses into the pelvis with tip terminating to the right. There is a small residual collection at the tip of this drain measuring 2.2 x 1.2 cm, series 3, image 71. This previously measured 8.8 x 3.5 cm. Multiple rim enhancing collections in the pelvis posterior to the uterus. Posteroinferior collection at site of previous strain measures 2.4 x 1.9 cm, previously 4.1 x 2 cm. An adjacent collection in the midline is not definitively communicating measuring 3.7 x 3.1 cm, unchanged allowing for differences in caliper placement. Adjacent free fluid no inflammatory change related to these collections. Postsurgical change of the anterior abdominal wall, no subcutaneous collection. No free air. Musculoskeletal: Stable osseous structures. IMPRESSION: 1. No fluid collection adjacent to the more superior right lower quadrant drain. Small residual collection adjacent to the more inferior drain has diminished in size from prior exam with residual collection measuring 2.2 x 1.2 cm. 2. Rim enhancing pelvic fluid collections persist, midline collection unchanged  in size 3.7 x 3.1 cm. Right pelvic collection posteriorly at site of previous drainage catheter measures 2.4 x 1.9 cm. 3. Decreased  size of perihepatic fluid collections. 4. Moderate right pleural effusion which has increased from prior exam. Small left pleural effusion. Associated compressive atelectasis. 5. Increased number of small retroperitoneal and central mesenteric lymph nodes, likely reactive. Electronically Signed   By: Narda Rutherford M.D.   On: 03/02/2022 20:12   DG Chest 2 View  Result Date: 03/01/2022 CLINICAL DATA:  Pleural effusion. PICC line position. Low-grade fever. EXAM: CHEST - 2 VIEW COMPARISON:  AP chest 02/27/2022; CT chest 02/22/2022 FINDINGS: Interval repositioning of the patient's right upper extremity PICC which previously cross the midline and terminated over the left brachiocephalic vein. This right upper extremity PICC tip now appropriately descends into the superior vena cava with the tip overlying the mid to inferior aspect of the superior vena cava. Cardiac silhouette and mediastinal contours are within normal limits. Mildly improved aeration of the right mid lung. Mild bilateral costophrenic angle opacities may represent right-greater-than-left pleural effusions with associated airspace opacity similar to prior. No pneumothorax is seen. No acute skeletal abnormality. IMPRESSION: 1. Interval repositioning of right upper extremity PICC with the tip now appropriately overlying the mid to inferior aspect of the superior vena cava. 2. Mildly improved aeration of the right midlung. Likely small right-greater-than-left pleural effusions with associated atelectasis versus pneumonia, similar to prior radiographs and 02/22/2022 chest CT. Electronically Signed   By: Neita Garnet M.D.   On: 03/01/2022 12:39   Recent Labs    03/01/22 0408 03/02/22 1435  WBC 9.0 8.1  HGB 9.9* 10.5*  HCT 30.6* 32.8*  PLT 572* 607*    Recent Labs    03/01/22 0408 03/02/22 1435  NA 135 135  K 3.5 3.8  CL 98 97*  CO2 24 27  GLUCOSE 88 96  BUN 16 19  CREATININE 0.86 0.80  CALCIUM 9.6 9.7     Intake/Output Summary  (Last 24 hours) at 03/03/2022 1013 Last data filed at 03/03/2022 0734 Gross per 24 hour  Intake 400 ml  Output 75 ml  Net 325 ml         Physical Exam: Vital Signs Blood pressure 107/70, pulse (!) 104, temperature 98.4 F (36.9 C), resp. rate 17, height 5\' 3"  (1.6 m), weight 67.6 kg, last menstrual period 10/26/2021, SpO2 92 %, unknown if currently breastfeeding.  Physical Exam Vitals and nursing note reviewed.  Constitutional:      Appearance: She is normal weight.  HENT:     Head: Normocephalic and atraumatic.  Eyes:     Extraocular Movements: Extraocular movements intact.     Conjunctiva/sclera: Conjunctivae normal.     Pupils: Pupils are equal, round, and reactive to light.  Cardiovascular:     Rate and Rhythm: Normal rate and regular rhythm.     Heart sounds: Normal heart sounds.  Pulmonary:     Effort: Pulmonary effort is normal. No respiratory distress.     Breath sounds: Normal breath sounds. No stridor.  [place] 2L 02 Abdominal:     General: Bowel sounds are normal. There is distension.     Tenderness: There is abdominal tenderness-improved Musculoskeletal:     Cervical back: Normal range of motion.     Comments: No pain with upper extremity or lower extremity range of motion  Skin:    Comments: Right lower quadrant drains x2, midline wound VAC, no surrounding erythema, tenderness around drains PICC line right  upper arm nontender no erythema  Neurological:     Mental Status: She is alert and oriented to person, place, and time.     Cranial Nerves: Cranial nerves 2-12 are intact. No dysarthria.     Comments: Motor strength is 4/5 bilateral hip flexor knee extensor ankle dorsiflexor 5/5 bilateral deltoid bicep tricep grip Sensation intact light touch bilateral upper and lower limbs Negative straight leg raising     Assessment/Plan: 1. Functional deficits which require 3+ hours per day of interdisciplinary therapy in a comprehensive inpatient rehab  setting. Physiatrist is providing close team supervision and 24 hour management of active medical problems listed below. Physiatrist and rehab team continue to assess barriers to discharge/monitor patient progress toward functional and medical goals  Care Tool:  Bathing    Body parts bathed by patient: Face   Body parts bathed by helper: Left arm, Right arm, Chest, Abdomen, Front perineal area, Buttocks, Right upper leg, Left upper leg, Right lower leg, Left lower leg     Bathing assist Assist Level: Total Assistance - Patient < 25%     Upper Body Dressing/Undressing Upper body dressing   What is the patient wearing?: Hospital gown only    Upper body assist Assist Level: Total Assistance - Patient < 25%    Lower Body Dressing/Undressing Lower body dressing      What is the patient wearing?: Hospital gown only     Lower body assist Assist for lower body dressing: Dependent - Patient 0%     Toileting Toileting    Toileting assist Assist for toileting: Total Assistance - Patient < 25%     Transfers Chair/bed transfer  Transfers assist  Chair/bed transfer activity did not occur: Refused  Chair/bed transfer assist level: Contact Guard/Touching assist     Locomotion Ambulation   Ambulation assist   Ambulation activity did not occur: Refused          Walk 10 feet activity   Assist  Walk 10 feet activity did not occur: Refused        Walk 50 feet activity   Assist Walk 50 feet with 2 turns activity did not occur: Refused         Walk 150 feet activity   Assist Walk 150 feet activity did not occur: Refused         Walk 10 feet on uneven surface  activity   Assist Walk 10 feet on uneven surfaces activity did not occur: Refused         Wheelchair     Assist Is the patient using a wheelchair?: No   Wheelchair activity did not occur: Refused         Wheelchair 50 feet with 2 turns activity    Assist    Wheelchair  50 feet with 2 turns activity did not occur: Refused       Wheelchair 150 feet activity     Assist  Wheelchair 150 feet activity did not occur: Refused       Blood pressure 107/70, pulse (!) 104, temperature 98.4 F (36.9 C), resp. rate 17, height 5\' 3"  (1.6 m), weight 67.6 kg, last menstrual period 10/26/2021, SpO2 92 %, unknown if currently breastfeeding.   Medical Problem List and Plan: 1. Functional deficits secondary to debility resulting from pelvic abscesses On IV Flagyll 500mg  po q12h and Ceftriaxone 2g qd until 7/18, Doxycycline 100mg  po BID through 7/11             -patient may not  shower             -ELOS/Goals: 7 to 10 days mod I goals  -PT/OT/SLP evals -7/10 Picc Line replaced today, Flagyl changed to IV, She was discussed with ID -She is declining pain meds, however declining therapy due to pain -7/11 IR to see her today, CMP, CBC,ammonia today -7/12 CT abdomen completed today  -Team conference today 2.  Antithrombotics: -DVT/anticoagulation:  Lovenox 40mg  [place] daily          -antiplatelet therapy: NA 3. Pain Management: fentanyl patch , oxyIR 5mg  Q8 prn, gabapentin 300mg  TID, flexeril 5mg  TID prn  -7/11 Gabapentin to 200mg  TID  7/11 has been refusing gabapentin, will reduce to 100mg  BID  7/12 Dilaudid once 0.5 mg prior to vac change 4. Mood/Sleep: cymbalta 60mg  qd which patient has been refusing             -antipsychotic agents:   -7/10 Cymbalta stopped due to possible nausea 5. Neuropsych/cognition: This patient is capable of making decisions on her own behalf.  Of note is that the pt signed out AMA ~3wks ago  6. Skin/Wound Care: WOC consult for wound vac , WOC following q M-W-F on acute  7/12 Wound care feels wound vac may be removed before discharge 7. Fluids/Electrolytes/Nutrition:     Latest Ref Rng & Units 03/02/2022    2:35 PM 03/01/2022    4:08 AM 02/25/2022    6:00 AM  BMP  Glucose 70 - 99 mg/dL 96  88    BUN 6 - 20 mg/dL 19  16  10     Creatinine 0.44 - 1.00 mg/dL 9/12   9/12   Sodium 135 - 145 mmol/L 135  135  138   Potassium 3.5 - 5.1 mmol/L 3.8  3.5  4.2   Chloride 98 - 111 mmol/L 97  98  101   CO2 22 - 32 mmol/L 27  24  28    Calcium 8.9 - 10.3 mg/dL 9.7  9.6  8.7    SCr Stable at 0.8, follow  8.  Migraines - has had IV compazine and benadryl in past, consider triptan  9.  Constipation dulc supp, Senokot 1 p.o. nightly, MiraLAX twice daily  -DC miralax, senna to PRN due to loose Bms  -7/12 improved 10.  GERD/nausea- protonix, mylicon 80mg  po q 6h prn, Zofran 4 mg IV or p.o. every 6 hours as needed -7/10 protonix changed to IV 11.  Insomnia trazodone 50mg  qhs 12. Elevated LFT  -hx of liver abscess, continue to monitor  -7/12 improved yesterday 13. Shortness of breath, on O2  -Likely related to fluid overload, she had been getting IV lasix, will continue  -7/12 continues to improve, CT scan yesterday noted R pleural effusion increased from prior exam, continue lasix   LOS: 3 days A FACE TO FACE EVALUATION WAS PERFORMED  426 03/03/2022, 10:13 AM

## 2022-03-03 NOTE — Progress Notes (Signed)
Patient ID: Paula Lucas, female   DOB: May 24, 1991, 31 y.o.   MRN: [id number]  Met with pt to update regarding team conference goals of supervision-mod/I level and PA has made antibiotics oral to try to get her home on Friday what pt is requesting, otherwise IV antibiotics are finished on 7/18. Pt wants to go home to go to niece's b-day party and reports she knows how to do the JP drain due toi has had them before. She and her Mom can manage them. Pt reports she will take the oral antibiotics, last time made her sick this is the reason changed to IV. Will see how she does and work toward discharge.

## 2022-03-03 NOTE — Telephone Encounter (Signed)
Received call from [facility] at [facility] requesting clarification on Rx for doxycycline. States [place] and [place] do not match. Please resend corrected Rx.

## 2022-03-04 ENCOUNTER — Inpatient Hospital Stay (HOSPITAL_COMMUNITY): Payer: Medicaid Other

## 2022-03-04 ENCOUNTER — Other Ambulatory Visit (HOSPITAL_COMMUNITY): Payer: Self-pay

## 2022-03-04 DIAGNOSIS — K750 Abscess of liver: Secondary | ICD-10-CM | POA: Diagnosis not present

## 2022-03-04 DIAGNOSIS — R7989 Other specified abnormal findings of blood chemistry: Secondary | ICD-10-CM | POA: Diagnosis not present

## 2022-03-04 DIAGNOSIS — E8770 Fluid overload, unspecified: Secondary | ICD-10-CM | POA: Diagnosis not present

## 2022-03-04 DIAGNOSIS — R5381 Other malaise: Secondary | ICD-10-CM | POA: Diagnosis not present

## 2022-03-04 MED ORDER — PANTOPRAZOLE SODIUM 40 MG PO TBEC
40.0000 mg | DELAYED_RELEASE_TABLET | Freq: Every day | ORAL | 0 refills | Status: DC
  Filled 2022-03-04: qty 30, 30d supply, fill #0

## 2022-03-04 MED ORDER — CYCLOBENZAPRINE HCL 5 MG PO TABS
5.0000 mg | ORAL_TABLET | Freq: Three times a day (TID) | ORAL | 0 refills | Status: DC | PRN
  Filled 2022-03-04: qty 45, 15d supply, fill #0

## 2022-03-04 MED ORDER — METRONIDAZOLE 500 MG PO TABS
500.0000 mg | ORAL_TABLET | Freq: Two times a day (BID) | ORAL | 0 refills | Status: DC
  Filled 2022-03-04: qty 9, 5d supply, fill #0

## 2022-03-04 MED ORDER — KETOROLAC TROMETHAMINE 15 MG/ML IJ SOLN: 15.0000 mg | Freq: Once | INTRAMUSCULAR | Status: DC

## 2022-03-04 MED ORDER — FUROSEMIDE 40 MG PO TABS
40.0000 mg | ORAL_TABLET | Freq: Every day | ORAL | Status: DC
  Administered 2022-03-04 – 2022-03-05 (×2): 40 mg via ORAL
  Filled 2022-03-04 (×3): qty 1

## 2022-03-04 MED ORDER — PROCHLORPERAZINE MALEATE 10 MG PO TABS
10.0000 mg | ORAL_TABLET | Freq: Four times a day (QID) | ORAL | 0 refills | Status: DC | PRN
  Filled 2022-03-04: qty 45, 12d supply, fill #0

## 2022-03-04 MED ORDER — OXYCODONE HCL 5 MG PO TABS: 10.0000 mg | ORAL_TABLET | ORAL | Status: DC | PRN

## 2022-03-04 MED ORDER — OXYCODONE HCL 5 MG PO TABS
10.0000 mg | ORAL_TABLET | Freq: Once | ORAL | Status: AC
  Administered 2022-03-05: 10 mg via ORAL
  Filled 2022-03-04: qty 2

## 2022-03-04 MED ORDER — OXYCODONE HCL 10 MG PO TABS
10.0000 mg | ORAL_TABLET | Freq: Four times a day (QID) | ORAL | 0 refills | Status: DC | PRN
Start: 2022-03-04 — End: 2022-03-11
  Filled 2022-03-04: qty 28, 7d supply, fill #0

## 2022-03-04 MED ORDER — KETOROLAC TROMETHAMINE 15 MG/ML IJ SOLN
15.0000 mg | Freq: Once | INTRAMUSCULAR | Status: AC
  Administered 2022-03-04: 15 mg via INTRAVENOUS
  Filled 2022-03-04: qty 1

## 2022-03-04 MED ORDER — MORPHINE SULFATE 15 MG PO TABS
15.0000 mg | ORAL_TABLET | ORAL | Status: DC | PRN
  Administered 2022-03-04: 15 mg via ORAL
  Filled 2022-03-04: qty 1

## 2022-03-04 MED ORDER — METRONIDAZOLE 500 MG PO TABS
500.0000 mg | ORAL_TABLET | Freq: Two times a day (BID) | ORAL | Status: DC
  Administered 2022-03-04 (×2): 500 mg via ORAL
  Filled 2022-03-04 (×2): qty 1

## 2022-03-04 MED ORDER — TRAZODONE HCL 50 MG PO TABS
50.0000 mg | ORAL_TABLET | Freq: Every day | ORAL | 0 refills | Status: DC
  Filled 2022-03-04: qty 15, 15d supply, fill #0

## 2022-03-04 MED ORDER — MORPHINE SULFATE 15 MG PO TABS
15.0000 mg | ORAL_TABLET | ORAL | 0 refills | Status: DC | PRN
  Filled 2022-03-04: qty 28, 7d supply, fill #0

## 2022-03-04 MED ORDER — AMOXICILLIN-POT CLAVULANATE 875-125 MG PO TABS
1.0000 | ORAL_TABLET | Freq: Two times a day (BID) | ORAL | 0 refills | Status: DC
  Filled 2022-03-04: qty 28, 14d supply, fill #0

## 2022-03-04 MED ORDER — NALOXONE HCL 4 MG/0.1ML NA LIQD
0.4000 mg | NASAL | 0 refills | Status: DC | PRN
  Filled 2022-03-04: qty 2, 30d supply, fill #0

## 2022-03-04 MED ORDER — MORPHINE SULFATE 15 MG PO TABS
15.0000 mg | ORAL_TABLET | ORAL | 0 refills | Status: DC | PRN
Start: 2022-03-04 — End: 2022-03-04
  Filled 2022-03-04: qty 28, 5d supply, fill #0

## 2022-03-04 MED ORDER — HYDROXYZINE HCL 10 MG PO TABS
10.0000 mg | ORAL_TABLET | Freq: Three times a day (TID) | ORAL | 0 refills | Status: DC | PRN
  Filled 2022-03-04: qty 30, 10d supply, fill #0

## 2022-03-04 MED ORDER — FENTANYL 12 MCG/HR TD PT72
1.0000 | MEDICATED_PATCH | TRANSDERMAL | Status: DC
  Administered 2022-03-04: 1 via TRANSDERMAL
  Filled 2022-03-04: qty 1

## 2022-03-04 MED ORDER — NALOXONE HCL 0.4 MG/ML IJ SOLN
0.4000 mg | INTRAMUSCULAR | Status: DC | PRN
Start: 2022-03-04 — End: 2022-03-05

## 2022-03-04 NOTE — IPOC Note (Signed)
Overall Plan of Care [facility]) Patient Details Name: Paula Lucas MRN: [id number] DOB: 1990-10-20  Admitting Diagnosis: Debility  Hospital Problems: Principal Problem:   Debility Active Problems:   Hepatic abscess   Hx of Fitz-Hugh-Curtis syndrome   Protein-calorie malnutrition (HCC)     Functional Problem List: Nursing Bowel, Pain, Endurance, Medication Management, Safety, Skin Integrity, Nutrition  PT Balance, Behavior, Endurance, Pain, Skin Integrity, Other (comment) (nausea)  OT Balance, Endurance, Pain, Safety  SLP    TR         Basic ADL's: OT Grooming, Eating, Bathing, Dressing, Toileting     Advanced  ADL's: OT       Transfers: PT Bed Mobility, Bed to Chair, Car, Occupational psychologist, Research scientist (life sciences): PT Ambulation, Psychologist, prison and probation services, Stairs     Additional Impairments: OT    SLP        TR      Anticipated Outcomes Item Anticipated Outcome  Self Feeding    Swallowing      Basic self-care  Mod I  Toileting  Mod I   Bathroom Transfers Mod I  Bowel/Bladder  manage bowel w mod I  Transfers  Mod I  Locomotion  Mod I  Communication     Cognition     Pain  Pain at or below level 4 w prns  Safety/Judgment  manage safety w cues   Therapy Plan: PT Intensity: Minimum of 1-2 x/day ,45 to 90 minutes PT Frequency: 5 out of 7 days PT Duration Estimated Length of Stay: 10-12 days OT Intensity: Minimum of 1-2 x/day, 45 to 90 minutes OT Frequency: 5 out of 7 days OT Duration/Estimated Length of Stay: 10-12 days     Team Interventions: Nursing Interventions Patient/Family Education, Bowel Management, Disease Management/Prevention, Medication Management, Discharge Planning, Skin Care/Wound Management, Pain Management  PT interventions Ambulation/gait training, Discharge planning, Functional mobility training, Psychosocial support, Therapeutic Activities, Balance/vestibular training, Disease management/prevention, Neuromuscular  re-education, Skin care/wound management, Therapeutic Exercise, Wheelchair propulsion/positioning, DME/adaptive equipment instruction, Cognitive remediation/compensation, Pain management, UE/LE Strength taining/ROM, Community reintegration, Equities trader education, Museum/gallery curator, UE/LE Coordination activities  OT Interventions Warden/ranger, Discharge planning, Functional mobility training, Therapeutic Activities, UE/LE Coordination activities, Patient/family education, Development worker, international aid stimulation, Community reintegration, Fish farm manager, Pain management, Skin care/wound managment, UE/LE Strength taining/ROM, Neuromuscular re-education, Self Care/advanced ADL retraining, Therapeutic Exercise, Wheelchair propulsion/positioning  SLP Interventions    TR Interventions    SW/CM Interventions Discharge Planning, Psychosocial Support, Patient/Family Education   Barriers to Discharge MD  Medical stability, IV antibiotics, and Wound care  Nursing Lack of/limited family support, Home environment access/layout, Wound Care solo in hotel/motel PTA, mother local to provide supervision  PT Home environment access/layout, Inaccessible home environment, Wound Care, Other (comments) pain, nausea, woun vac, JP drains x 2, 7 steps with 2 handrails in home. Unsure of exact D/C plan  OT Home environment access/layout, New oxygen, Lack of/limited family support, Wound Care, Other (comments) unknown on discharge environment or amount of support Mom will be able to provide. Increased nausea during evaluation which limited patient's ability to participate fully.  SLP      SW Lack of/limited family support, Behavior, IV antibiotics, Wound Care     Team Discharge Planning: Destination: PT-Home ,OT- Home , SLP-  Projected Follow-up: PT-Outpatient PT, OT-  Other (comment) (TBD), SLP-  Projected Equipment Needs: PT-To be determined, OT- To be determined, SLP-  Equipment Details: PT-  , OT-  Patient/family involved in discharge planning: PT- Patient,  OT-Patient, SLP-   MD ELOS: 7-10 Medical Rehab Prognosis:  Good Assessment: The patient has been admitted for CIR therapies with the diagnosis of  debility resulting from pelvic abscesses. The team will be addressing functional mobility, strength, stamina, balance, safety, adaptive techniques and equipment, self-care, bowel and bladder mgt, patient and caregiver education. Goals have been set at mod I. Anticipated discharge destination is home.       See Team Conference Notes for weekly updates to the plan of care

## 2022-03-04 NOTE — Progress Notes (Signed)
Pt stating she is wanting to leave AMA, refusing all forms of medication, and treatment. Pt educated on meaning of what leaving AMA means. Pts mother called stating patient is going to stay, this nurse spoke with patient again after conversation via phone with mother. She states she will stay but patient continue to state she refuses all medications, vital signs, and any forms of medication management if she has to stay. Pt would like to speak with PA pamela love. Dr. Benjie Karvonen made aware and Delle Reining made aware of situation will speak with patient.   Paula Lucas

## 2022-03-04 NOTE — Progress Notes (Signed)
Inpatient Rehabilitation Discharge Medication Review by a Pharmacist  A complete drug regimen review was completed for this patient to identify any potential clinically significant medication issues.  High Risk Drug Classes Is patient taking? Indication by Medication  Antipsychotic No Compazine- N/V  Anticoagulant No   Antibiotic Yes Augmentin- intra-abdominal infection  Opioid Yes MSIR: acute pain  Antiplatelet No   Hypoglycemics/insulin No   Vasoactive Medication No   Chemotherapy No   Other Yes Pantoprazole: GERD Trazodone: sleep Cyclobenzaprine: muscle spasms     Type of Medication Issue Identified Description of Issue Recommendation(s)  Drug Interaction(s) (clinically significant)     Duplicate Therapy     Allergy     No Medication Administration End Date     Incorrect Dose     Additional Drug Therapy Needed     Significant med changes from prior encounter (inform family/care partners about these prior to discharge). No PTA medications   Other       Clinically significant medication issues were identified that warrant physician communication and completion of prescribed/recommended actions by midnight of the next day:  No   Time spent performing this drug regimen review (minutes): 30   Elise Gladden BS, PharmD, BCPS Clinical Pharmacist 03/04/2022 2:42 PM  Contact: [phone] after 3 PM  "Be curious, not judgmental..." -Debbora Dus

## 2022-03-04 NOTE — Discharge Instructions (Addendum)
Inpatient Rehab Discharge Instructions  Paula Lucas Discharge date and time: 03/05/22   Activities/Precautions/ Functional Status: Activity: no lifting, driving, or strenuous exercise till cleared by MD Diet: regular diet Wound Care: Cleanse abdominal wound with normal saline, dampen 4 X 4 gauze--wring it out then pack the open area on the belly. Cover with dry gauze and change daily (more often if soiled). Contact gynecology if you develop any problems with your incision/wound--redness, swelling, increase in pain, drainage or if you develop fever or chills.      Functional status:  ___ No restrictions     ___ Walk up steps independently ___ 24/7 supervision/assistance   ___ Walk up steps with assistance ___ Intermittent supervision/assistance  ___ Bathe/dress independently ___ Walk with walker     _X__ Bathe/dress with assistance ___ Walk Independently    ___ Shower independently ___ Walk with assistance    ___ Shower with assistance _X__ No alcohol     ___ Return to work/school ________  Special Instructions: Flush each drain with 5 cc syringe twice a day. Record drainage and take record with you to your MD appointment    COMMUNITY REFERRALS UPON DISCHARGE:    Home Exercise Program given to pt-unable to find home health agency to accept pt's insurance both she and her Mom are familiar with taking care of jp drains and wound care have had to do this before. Wound supplies given to patient for home  Medical Equipment/Items Ordered:none needed                                                 Agency/Supplier:na    My questions have been answered and I understand these instructions. I will adhere to these goals and the provided educational materials after my discharge from the hospital.  Patient/Caregiver Signature _______________________________ Date __________  Clinician Signature _______________________________________ Date __________  Please bring this form and your  medication list with you to all your follow-up doctor's appointments.

## 2022-03-04 NOTE — Progress Notes (Signed)
PICC line removed per order.  Pressure held for 5 minutes, no bleeding or swelling noted.  Vaseline gauze, gauze, and tegaderm applied.  Pt education provided with pt verbalizing understanding.     

## 2022-03-04 NOTE — Progress Notes (Signed)
Occupational Therapy Discharge Summary  Patient Details  Name: Paula Lucas MRN: [id number] Date of Birth: 1990/11/05   Patient has met 7 of 7 long term goals due to improved activity tolerance and ability to compensate for deficits of decreased trunk ROM due to abdominal pain.  Patient to discharge at overall Modified Independent level.  Patient's care partner is independent to provide the necessary physical assistance at discharge.  Pt did not actually participate with OT but was very adamant that she was able to do all tasks independently.  She refused therapies except for getting dressed with PT.  No formal family education done with mom as pt insistent on leaving as soon as possible.   Reasons goals not met: n/a (pt did not actively participate in therapy. She did not participate in evaluation, a few days later showed that she could do things with supervision in the hospital setting as she continued to have the wound vac and PICC line).  At home, she will be able to move around more easily without the lines and can be Mod I with basic self care.   Recommendation:  No further OT services recommended at this time.  Equipment: No equipment provided  Reasons for discharge: refusal of 3 consecutive treatment sessions without medical reason, treatment goals met, and discharge from hospital  Patient/family agrees with progress made and goals achieved: Yes  OT Discharge Precautions/Restrictions  Precautions Precaution Comments: abd JP x2 drains, wound vac; watch HR/O2 Restrictions Weight Bearing Restrictions: No  ADL ADL Eating: Independent Where Assessed-Eating: Bed level Grooming: Independent Where Assessed-Grooming: Sitting at sink, Wheelchair Upper Body Bathing: Independent Where Assessed-Upper Body Bathing: Sitting at sink, Wheelchair Lower Body Bathing: Independent Where Assessed-Lower Body Bathing: Wheelchair, Sitting at sink Upper Body Dressing: Independent Where  Assessed-Upper Body Dressing: Chair Lower Body Dressing: Independent (per pt report) Where Assessed-Lower Body Dressing: Wheelchair (& standing) Toileting: Modified independent Where Assessed-Toileting: Bedside Commode Toilet Transfer: Modified independent Toilet Transfer Method: Counselling psychologist: Radiographer, therapeutic: Not assessed Social research officer, government: Not assessed Vision Baseline Vision/History: 0 No visual deficits Patient Visual Report: No change from baseline Vision Assessment?: No apparent visual deficits Perception  Perception: Within Functional Limits Praxis Praxis: Intact Cognition Cognition Overall Cognitive Status: Within Functional Limits for tasks assessed Arousal/Alertness: Awake/alert Orientation Level: Person;Place;Situation Person: Oriented Place: Oriented Situation: Oriented Memory: Appears intact Awareness: Appears intact Problem Solving: Impaired Problem Solving Impairment: Functional basic Executive Function: Reasoning Reasoning: Impaired Reasoning Impairment: Functional basic Behaviors: Restless;Poor frustration tolerance Safety/Judgment: Impaired Comments: decreased safety awareness of impact of her decisions (leaving hospital early) and not fully getting her medical treatment to stop infection Brief Interview for Mental Status (BIMS) Repetition of Three Words (First Attempt): 3 Temporal Orientation: Year: Correct Temporal Orientation: Month: Accurate within 5 days Temporal Orientation: Day: Correct Recall: "Sock": Yes, no cue required Recall: "Blue": Yes, no cue required Recall: "Bed": Yes, no cue required BIMS Summary Score: 15 Sensation Sensation Light Touch: Appears Intact Hot/Cold: Appears Intact Proprioception: Appears Intact Stereognosis: Appears Intact Coordination Gross Motor Movements are Fluid and Coordinated: Yes Fine Motor Movements are Fluid and Coordinated: Yes Finger Nose Finger Test:  [facility] bilaterally Motor  Motor Motor: Within Functional Limits Mobility  Transfers Sit to Stand: Independent with assistive device Stand to Sit: Independent with assistive device  Trunk/Postural Assessment  Cervical Assessment Cervical Assessment: Within Functional Limits Postural Control Postural Control: Within Functional Limits  Balance Static Standing Balance Static Standing - Level of Assistance: 7: Independent Extremity/Trunk Assessment  RUE Assessment RUE Assessment: Within Functional Limits LUE Assessment LUE Assessment: Within Functional Limits   Paula Lucas 03/04/2022, 5:48 PM

## 2022-03-04 NOTE — Progress Notes (Addendum)
Patient ID: NKENGE SONNTAG, female   DOB: 07-25-1991, 31 y.o.   MRN: [id number]  Summoned to pt's room she reports she wants to go home today instead of tomorrow. Her Mom can be here at 3:00 to transport her home. She is tired of being here and starting to have restless leg syndrome, according to pt. She reports she is taking her oral antibiotics and doesn;t need the IV anymore. Have messaged MD and PA regarding this request  9:34 am MD feels pt needs to stay until tomorrow due to spiked temp last night. So if pt insists on leaving today will need to sign out AMA. MD has written ann order to remove the PICC line and bedside RN can remove the wound vac according to WOC. Pt is aware will only get antibiotics to take home and not pain meds. Pt's plans on leaving today and reports her Mom is coming.  10:17 Am Pt has spoken with her Mom and Mom has told her to stay due to her fever last night. PT will stay but will refuse all medical care and meds. Informed her she needs to take the antibiotics at least. Pt reports her Mom is coming up here will await her arrival.

## 2022-03-04 NOTE — Progress Notes (Signed)
Physical Therapy Session Note  Patient Details  Name: Paula Lucas MRN: [id number] Date of Birth: Jan 16, 1991  Today's Date: 03/04/2022 PT Individual Time:  -      Short Term Goals: Week 1:  PT Short Term Goal 1 (Week 1): pt will transfer bed<>chair with LRAD and supervision PT Short Term Goal 2 (Week 1): pt will ambulate 67ft with LRAD and supervision PT Short Term Goal 3 (Week 1): pt will navigate 7 steps with 2 handrails and CGA  Skilled Therapeutic Interventions/Progress Updates: Pt awake upon arrival, pt adamant about leaving today. Discussed that pt still has wound vac to which pt stated "they can take it off today". Pt frustrated because per pt "no one is helping me". Pt refusing all intervention. PTA advised nsg and LSW regaring pt's status. Pt missed skilled PT.      Therapy Documentation Precautions:  Precautions Precautions: Fall Precaution Comments: abd JP x2 drains, wound vac; watch HR/O2 Restrictions Weight Bearing Restrictions: No General:   Vital Signs: Therapy Vitals Temp: 99.8 F (37.7 C) Pulse Rate: (!) 101 Resp: 18 BP: 101/68 Patient Position (if appropriate): Lying Oxygen Therapy SpO2: 94 % O2 Device: Room Air Pain:   Mobility:   Locomotion :    Trunk/Postural Assessment :    Balance:   Exercises:   Other Treatments:      Therapy/Group: Individual Therapy  Paula Lucas 03/04/2022, 7:53 AM

## 2022-03-04 NOTE — Progress Notes (Signed)
Occupational Therapy Session Note  Patient Details  Name: Paula Lucas MRN: [id number] Date of Birth: February 25, 1991  Today's Date: 03/04/2022 OT Individual Time: 1030-1045 OT Individual Time Calculation (min): 15 min  and Today's Date: 03/04/2022 OT Missed Time: 15 Minutes Missed Time Reason: Other (comment) (pt on phone call)   Short Term Goals: Week 1:  OT Short Term Goal 1 (Week 1): Patient will report a decrease in pain level to 6/10 while utilizing pain management techniques in order to participate in all therapy sessions throughout the day. OT Short Term Goal 2 (Week 1): Patient will complete all bathing and dressing at [place] while utilizing pain management techniques. OT Short Term Goal 3 (Week 1): Patient will complete all aspects of toileting at Supervision level.  Skilled Therapeutic Interventions/Progress Updates:    Entered room to start therapy with pt.  After entering, phone rang and pt on phone with sister telling sister how upset she is as she wants to leave today.   Pt on phone for a long time (15 min).  After call ended pt telling me she was very upset and she wants to go home. Explained that she has an elevated temp.  Pt states it is better now but is also refusing all vitals.   "I am not going to do anything! No medications, no vitals, no therapy"  Tried to encourage her to exercise. "I will do that at home" also tried to talk to her about the dangers of going home with elevated temps if her infection is still active.  "It was only elevated because of the antibiotics".  Pt very difficult to reason with.  She is intent on leaving and strongly recommended she follow MD orders to ensure she is healthy enough to leave.   Pt resting in bed with all needs met.   Therapy Documentation Precautions:  Precautions Precautions: Fall Precaution Comments: abd JP x2 drains, wound vac; watch HR/O2 Restrictions Weight Bearing Restrictions: No    Vital Signs: Therapy  Vitals Temp: 99.8 F (37.7 C) Pulse Rate: (!) 101 Resp: 18 BP: 101/68 Patient Position (if appropriate): Lying Oxygen Therapy SpO2: 94 % O2 Device: Room Air Pain:   ADL: ADL Eating: Set up Where Assessed-Eating: Bed level Grooming: Dependent Where Assessed-Grooming: Sitting at sink, Wheelchair Upper Body Bathing: Dependent Where Assessed-Upper Body Bathing: Sitting at sink, Wheelchair Lower Body Bathing: Dependent Where Assessed-Lower Body Bathing: Wheelchair, Sitting at sink Upper Body Dressing: Setup (Pt able to don a pull-over shirt with set-up assist for item retrieval while seated in the w/c.) Where Assessed-Upper Body Dressing: Chair Lower Body Dressing: Maximal assistance (Pt requires assistance with 2/3 portions of task to don pants. pt requires assistance to thread BLE through pants. However, is able ot pull pants up while standing with CGA for safety.) Where Assessed-Lower Body Dressing: Wheelchair (& standing) Toileting: Contact guard (Pt able to complete toileting hygiene with CGA for standing balance after voiding bladder. However, pt reports increased difficulty with hygiene after a BM. Education provided to pt on comp techniques to improve independence with task.) Where Assessed-Toileting: Bedside Commode Toilet Transfer: Contact guard (Pt able to complete a step-pivot transfer from EOB > BSC for CGA for safety and no AD.) Toilet Transfer Method: Stand pivot Toilet Transfer Equipment: Radiographer, therapeutic: Not assessed Social research officer, government: Not assessed ADL Comments: Pt participates well in ADL session today. Pt reports increased discomfort with sit <> stand transitions. Pt reports increased difficulty with toileting hygiene after a BM.  OT provides education on positioning and comp techniques to improve independence with toileting hygiene. Education provided to pt and verbally introduced AE for LB dressing to improve independence with dressing tasks  and reported plan to address in future OT sessions. OT also provided a visual and demo on technique to [employer] a tub-transfer bench in the tub room to improve pt's understanding of DME that potentially will be recommended for DC. Pt verbalizes understanding.   Therapy/Group: Individual Therapy  [place] 03/04/2022, 8:24 AM

## 2022-03-04 NOTE — Progress Notes (Addendum)
PROGRESS NOTE   Subjective/Complaints: Fever last night.  This am pt wanted to go home AMA, she later decided to stay until tomorrow.    Review of Systems  Constitutional:  Positive for fever. Negative for chills.  Respiratory:  Positive for shortness of breath.   Cardiovascular:  Negative for chest pain and palpitations.  Gastrointestinal:  Positive for abdominal pain and nausea.  Neurological:  Positive for weakness.      Objective:   DG Chest 2 View  Result Date: 03/04/2022 CLINICAL DATA:  Fever EXAM: CHEST - 2 VIEW COMPARISON:  03/01/2022 chest radiograph. FINDINGS: Right PICC terminates in the upper third of the SVC. Low lung volumes. Stable cardiomediastinal silhouette with normal heart size. No pneumothorax. Stable small moderate right pleural effusion. Small left pleural effusion, decreased. Bilateral lower lobes dense opacity and volume loss, unchanged. IMPRESSION: 1. Stable small to moderate right pleural effusion. 2. Small left pleural effusion, decreased. 3. Stable bilateral lower lobe dense opacity and volume loss, either atelectasis or pneumonia. Electronically Signed   By: Ilona Sorrel M.D.   On: 03/04/2022 08:14   CT ABDOMEN PELVIS W CONTRAST  Result Date: 03/02/2022 CLINICAL DATA:  Follow-up intra-abdominal abscess. Tubo-ovarian abscess status post drain. Now with low output. EXAM: CT ABDOMEN AND PELVIS WITH CONTRAST TECHNIQUE: Multidetector CT imaging of the abdomen and pelvis was performed using the standard protocol following bolus administration of intravenous contrast. RADIATION DOSE REDUCTION: This exam was performed according to the departmental dose-optimization program which includes automated exposure control, adjustment of the mA and/or kV according to patient size and/or use of iterative reconstruction technique. CONTRAST:  153mL OMNIPAQUE IOHEXOL 300 MG/ML  SOLN COMPARISON:  Most recent CT 02/14/2022  FINDINGS: Lower chest: Moderate-sized right pleural effusion which has increased from prior exam. Small left pleural effusion. Associated compressive atelectasis. Hepatobiliary: The lentiform fluid collection adjacent to the anterior dome of the liver has diminished in size measuring 3.5 x 0.7 cm, series 3, image 9, previously 5.7 x 1.1 cm. There is no peripheral enhancement. Decreased size of the peripherally enhancing thick wall perihepatic collection adjacent to the inferior liver tip, 4.6 x 1.1 x 3.5 cm, previously 9.5 x 3.1 x 3.5 cm. There is no intrahepatic collection or focal hepatic lesion. Unremarkable gallbladder. Pancreas: Unremarkable. No pancreatic ductal dilatation or surrounding inflammatory changes. Spleen: Normal in size without focal abnormality. Adrenals/Urinary Tract: Normal adrenal glands. No hydronephrosis, renal calculi, or focal abnormality. Unremarkable urinary bladder. Stomach/Bowel: No bowel obstruction or inflammation. Normal appendix tentatively visualized. Vascular/Lymphatic: Normal caliber abdominal aorta. No acute vascular findings. Increased number of small retroperitoneal and central mesenteric lymph nodes, likely reactive. Reproductive: Pelvic fluid collections as described below. The uterus is unremarkable. The ovaries are not well-defined. Other: 2 drains into the right anterior pelvis. The more superior drain courses superiorly and terminates in the upper abdomen. There is no fluid collection adjacent to the drain. The more inferior drain courses into the pelvis with tip terminating to the right. There is a small residual collection at the tip of this drain measuring 2.2 x 1.2 cm, series 3, image 71. This previously measured 8.8 x 3.5 cm. Multiple rim enhancing collections in the pelvis  posterior to the uterus. Posteroinferior collection at site of previous strain measures 2.4 x 1.9 cm, previously 4.1 x 2 cm. An adjacent collection in the midline is not definitively  communicating measuring 3.7 x 3.1 cm, unchanged allowing for differences in caliper placement. Adjacent free fluid no inflammatory change related to these collections. Postsurgical change of the anterior abdominal wall, no subcutaneous collection. No free air. Musculoskeletal: Stable osseous structures. IMPRESSION: 1. No fluid collection adjacent to the more superior right lower quadrant drain. Small residual collection adjacent to the more inferior drain has diminished in size from prior exam with residual collection measuring 2.2 x 1.2 cm. 2. Rim enhancing pelvic fluid collections persist, midline collection unchanged in size 3.7 x 3.1 cm. Right pelvic collection posteriorly at site of previous drainage catheter measures 2.4 x 1.9 cm. 3. Decreased size of perihepatic fluid collections. 4. Moderate right pleural effusion which has increased from prior exam. Small left pleural effusion. Associated compressive atelectasis. 5. Increased number of small retroperitoneal and central mesenteric lymph nodes, likely reactive. Electronically Signed   By: Narda Rutherford M.D.   On: 03/02/2022 20:12    Recent Labs    03/02/22 1435  WBC 8.1  HGB 10.5*  HCT 32.8*  PLT 607*    Recent Labs    03/02/22 1435  NA 135  K 3.8  CL 97*  CO2 27  GLUCOSE 96  BUN 19  CREATININE 0.80  CALCIUM 9.7     Intake/Output Summary (Last 24 hours) at 03/04/2022 0756 Last data filed at 03/03/2022 1832 Gross per 24 hour  Intake 700 ml  Output --  Net 700 ml         Physical Exam: Vital Signs Blood pressure 101/68, pulse (!) 101, temperature 99.8 F (37.7 C), resp. rate 18, height 5\' 3"  (1.6 m), weight 65 kg, last menstrual period 10/26/2021, SpO2 94 %, unknown if currently breastfeeding.  Physical Exam Vitals and nursing note reviewed.  Constitutional:      Appearance: She is normal weight.  HENT:     Head: Normocephalic and atraumatic.  Eyes:     Extraocular Movements: Extraocular movements intact.      Conjunctiva/sclera: Conjunctivae normal.     Pupils: Pupils are equal, round, and reactive to light.  Cardiovascular:     Rate and Rhythm: Normal rate and regular rhythm.     Heart sounds: Normal heart sounds.  Pulmonary:     Effort: Pulmonary effort is normal. No respiratory distress.     Breath sounds: Normal breath sounds. No stridor.  RA Abdominal:     General: Bowel sounds are normal. There is distension.     Tenderness: There is abdominal tenderness-improved Musculoskeletal:     Cervical back: Normal range of motion.     Comments: No pain with upper extremity or lower extremity range of motion  Skin:    Comments: Right lower quadrant drains x2, midline wound VAC, no surrounding erythema, drains with no erythema PICC line right upper arm nontender no erythema Neurological:     Mental Status: She is alert and oriented to person, place, and time.     Cranial Nerves: Cranial nerves 2-12 are intact. No dysarthria.     Comments: Motor strength is 4/5 bilateral hip flexor knee extensor ankle dorsiflexor 5/5 bilateral deltoid bicep tricep grip Sensation intact light touch bilateral upper and lower limbs Negative straight leg raising     Assessment/Plan: 1. Functional deficits which require 3+ hours per day of interdisciplinary therapy in a  comprehensive inpatient rehab setting. Physiatrist is providing close team supervision and 24 hour management of active medical problems listed below. Physiatrist and rehab team continue to assess barriers to discharge/monitor patient progress toward functional and medical goals  Care Tool:  Bathing    Body parts bathed by patient: Face   Body parts bathed by helper: Left arm, Right arm, Chest, Abdomen, Front perineal area, Buttocks, Right upper leg, Left upper leg, Right lower leg, Left lower leg     Bathing assist Assist Level: Total Assistance - Patient < 25%     Upper Body Dressing/Undressing Upper body dressing   What is the patient  wearing?: Hospital gown only    Upper body assist Assist Level: Total Assistance - Patient < 25%    Lower Body Dressing/Undressing Lower body dressing      What is the patient wearing?: Hospital gown only     Lower body assist Assist for lower body dressing: Dependent - Patient 0%     Toileting Toileting    Toileting assist Assist for toileting: Total Assistance - Patient < 25%     Transfers Chair/bed transfer  Transfers assist  Chair/bed transfer activity did not occur: Refused  Chair/bed transfer assist level: Contact Guard/Touching assist     Locomotion Ambulation   Ambulation assist   Ambulation activity did not occur: Refused          Walk 10 feet activity   Assist  Walk 10 feet activity did not occur: Refused        Walk 50 feet activity   Assist Walk 50 feet with 2 turns activity did not occur: Refused         Walk 150 feet activity   Assist Walk 150 feet activity did not occur: Refused         Walk 10 feet on uneven surface  activity   Assist Walk 10 feet on uneven surfaces activity did not occur: Refused         Wheelchair     Assist Is the patient using a wheelchair?: No   Wheelchair activity did not occur: Refused         Wheelchair 50 feet with 2 turns activity    Assist    Wheelchair 50 feet with 2 turns activity did not occur: Refused       Wheelchair 150 feet activity     Assist  Wheelchair 150 feet activity did not occur: Refused       Blood pressure 101/68, pulse (!) 101, temperature 99.8 F (37.7 C), resp. rate 18, height 5\' 3"  (1.6 m), weight 65 kg, last menstrual period 10/26/2021, SpO2 94 %, unknown if currently breastfeeding.   Medical Problem List and Plan: 1. Functional deficits secondary to debility resulting from pelvic abscesses On IV Flagyll 500mg  po q12h and Ceftriaxone 2g qd until 7/18, Doxycycline 100mg  po BID through 7/11             -patient may not shower              -ELOS/Goals: 7 to 10 days mod I goals  -PT/OT/SLP evals -7/10 Picc Line replaced today, Flagyl changed to IV, She was discussed with ID -She is declining pain meds, however declining therapy due to pain -7/11 IR to see her today, CMP, CBC,ammonia today -7/12 CT abdomen completed today  -DC picc line  -Will plan for DC tomorrow  -7/13 Fever last night  -She has been transitioned to oral augmentin , flagyl 2.  Antithrombotics: -DVT/anticoagulation:  Lovenox 40mg  [place] daily          -antiplatelet therapy: NA 3. Pain Management: fentanyl patch , oxyIR 5mg  Q8 prn, gabapentin 300mg  TID, flexeril 5mg  TID prn  -7/11 Gabapentin to 200mg  TID  7/11 has been refusing gabapentin, will reduce to 100mg  BID  7/12 Dilaudid once 0.5 mg prior to vac change  7/13 fentanyl patch decreased to 12 4. Mood/Sleep: cymbalta 60mg  qd which patient has been refusing             -antipsychotic agents:   -7/10 Cymbalta stopped due to possible nausea 5. Neuropsych/cognition: This patient is capable of making decisions on her own behalf.  Of note is that the pt signed out AMA ~3wks ago  6. Skin/Wound Care: WOC consult for wound vac , WOC following q M-W-F on acute  7/12 Wound care feels wound vac may be removed before discharge 7. Fluids/Electrolytes/Nutrition:     Latest Ref Rng & Units 03/02/2022    2:35 PM 03/01/2022    4:08 AM 02/25/2022    6:00 AM  BMP  Glucose 70 - 99 mg/dL 96  88  9/12   BUN 6 - 20 mg/dL 19  16  10    Creatinine 0.44 - 1.00 mg/dL 8/13   9/12   Sodium 135 - 145 mmol/L 135  135  138   Potassium 3.5 - 5.1 mmol/L 3.8  3.5  4.2   Chloride 98 - 111 mmol/L 97  98  101   CO2 22 - 32 mmol/L 27  24  28    Calcium 8.9 - 10.3 mg/dL 9.7  9.6  8.7    SCr Stable at 0.8, follow Recheck tomorrow  8.  Migraines - has had IV compazine and benadryl in past, consider triptan  9.  Constipation dulc supp, Senokot 1 p.o. nightly, MiraLAX twice daily  -DC miralax, senna to PRN due to loose Bms  -7/12  improved 10.  GERD/nausea- protonix, mylicon 80mg  po q 6h prn, Zofran 4 mg IV or p.o. every 6 hours as needed -7/10 protonix changed to IV 11.  Insomnia trazodone 50mg  qhs 12. Elevated LFT  -hx of liver abscess, continue to monitor  -7/12 improved yesterday  Recheck tomorrow 13. Shortness of breath, on O2  -Likely related to fluid overload, she had been getting IV lasix, will continue  -7/12 continues to improve, CT scan yesterday noted R pleural effusion increased from prior exam, continue lasix   7/13 On RA with 96 O2 sat, improved, CXR today stable, lasix PO 14. Fever last night, improved  -blood cx - no growth at this time, CXR stable, Urine cx  LOS: 4 days A FACE TO FACE EVALUATION WAS PERFORMED  03/04/2022, 7:56 AM

## 2022-03-04 NOTE — Plan of Care (Signed)
  Problem: RH Tub/Shower Transfers Goal: LTG Patient will perform tub/shower transfers w/assist (OT) Description: LTG: Patient will perform tub/shower transfers with assist, with/without cues using equipment (OT) Flowsheets (Taken 03/04/2022 1751) LTG: Pt will perform tub/shower stall transfers with assistance level of: (LTG discontinued as pt is not able to shower at this time due to abdominal wound and JP drains.) -- Note: LTG discontinued as pt is not able to shower at this time due to abdominal wound and JP drains

## 2022-03-04 NOTE — Discharge Summary (Signed)
Physician Discharge Summary  Patient ID: Paula Lucas MRN: [id number] DOB/AGE: 1991-01-21 31 y.o.  Admit date: 02/28/2022 Discharge date: 03/05/2022  Discharge Diagnoses:  Principal Problem:   Debility Active Problems:   Hepatic abscess   Hx of Fitz-Hugh-Curtis syndrome   Protein-calorie malnutrition (HCC)   Hypokalemia   Pleural effusion   Acute blood loss anemia   Discharged Condition: good  Significant Diagnostic Studies: DG Chest 2 View  Result Date: 03/04/2022 CLINICAL DATA:  Fever EXAM: CHEST - 2 VIEW COMPARISON:  03/01/2022 chest radiograph. FINDINGS: Right PICC terminates in the upper third of the SVC. Low lung volumes. Stable cardiomediastinal silhouette with normal heart size. No pneumothorax. Stable small moderate right pleural effusion. Small left pleural effusion, decreased. Bilateral lower lobes dense opacity and volume loss, unchanged. IMPRESSION: 1. Stable small to moderate right pleural effusion. 2. Small left pleural effusion, decreased. 3. Stable bilateral lower lobe dense opacity and volume loss, either atelectasis or pneumonia. Electronically Signed   By: Ilona Sorrel M.D.   On: 03/04/2022 08:14   CT ABDOMEN PELVIS W CONTRAST  Result Date: 03/02/2022 CLINICAL DATA:  Follow-up intra-abdominal abscess. Tubo-ovarian abscess status post drain. Now with low output. EXAM: CT ABDOMEN AND PELVIS WITH CONTRAST TECHNIQUE: Multidetector CT imaging of the abdomen and pelvis was performed using the standard protocol following bolus administration of intravenous contrast. RADIATION DOSE REDUCTION: This exam was performed according to the departmental dose-optimization program which includes automated exposure control, adjustment of the mA and/or kV according to patient size and/or use of iterative reconstruction technique. CONTRAST:  147m OMNIPAQUE IOHEXOL 300 MG/ML  SOLN COMPARISON:  Most recent CT 02/14/2022 FINDINGS: Lower chest: Moderate-sized right pleural effusion which has  increased from prior exam. Small left pleural effusion. Associated compressive atelectasis. Hepatobiliary: The lentiform fluid collection adjacent to the anterior dome of the liver has diminished in size measuring 3.5 x 0.7 cm, series 3, image 9, previously 5.7 x 1.1 cm. There is no peripheral enhancement. Decreased size of the peripherally enhancing thick wall perihepatic collection adjacent to the inferior liver tip, 4.6 x 1.1 x 3.5 cm, previously 9.5 x 3.1 x 3.5 cm. There is no intrahepatic collection or focal hepatic lesion. Unremarkable gallbladder. Pancreas: Unremarkable. No pancreatic ductal dilatation or surrounding inflammatory changes. Spleen: Normal in size without focal abnormality. Adrenals/Urinary Tract: Normal adrenal glands. No hydronephrosis, renal calculi, or focal abnormality. Unremarkable urinary bladder. Stomach/Bowel: No bowel obstruction or inflammation. Normal appendix tentatively visualized. Vascular/Lymphatic: Normal caliber abdominal aorta. No acute vascular findings. Increased number of small retroperitoneal and central mesenteric lymph nodes, likely reactive. Reproductive: Pelvic fluid collections as described below. The uterus is unremarkable. The ovaries are not well-defined. Other: 2 drains into the right anterior pelvis. The more superior drain courses superiorly and terminates in the upper abdomen. There is no fluid collection adjacent to the drain. The more inferior drain courses into the pelvis with tip terminating to the right. There is a small residual collection at the tip of this drain measuring 2.2 x 1.2 cm, series 3, image 71. This previously measured 8.8 x 3.5 cm. Multiple rim enhancing collections in the pelvis posterior to the uterus. Posteroinferior collection at site of previous strain measures 2.4 x 1.9 cm, previously 4.1 x 2 cm. An adjacent collection in the midline is not definitively communicating measuring 3.7 x 3.1 cm, unchanged allowing for differences in caliper  placement. Adjacent free fluid no inflammatory change related to these collections. Postsurgical change of the anterior abdominal wall, no subcutaneous collection. No  free air. Musculoskeletal: Stable osseous structures. IMPRESSION: 1. No fluid collection adjacent to the more superior right lower quadrant drain. Small residual collection adjacent to the more inferior drain has diminished in size from prior exam with residual collection measuring 2.2 x 1.2 cm. 2. Rim enhancing pelvic fluid collections persist, midline collection unchanged in size 3.7 x 3.1 cm. Right pelvic collection posteriorly at site of previous drainage catheter measures 2.4 x 1.9 cm. 3. Decreased size of perihepatic fluid collections. 4. Moderate right pleural effusion which has increased from prior exam. Small left pleural effusion. Associated compressive atelectasis. 5. Increased number of small retroperitoneal and central mesenteric lymph nodes, likely reactive. Electronically Signed   By: Keith Rake M.D.   On: 03/02/2022 20:12   DG Chest 2 View  Result Date: 03/01/2022 CLINICAL DATA:  Pleural effusion. PICC line position. Low-grade fever. EXAM: CHEST - 2 VIEW COMPARISON:  AP chest 02/27/2022; CT chest 02/22/2022 FINDINGS: Interval repositioning of the patient's right upper extremity PICC which previously cross the midline and terminated over the left brachiocephalic vein. This right upper extremity PICC tip now appropriately descends into the superior vena cava with the tip overlying the mid to inferior aspect of the superior vena cava. Cardiac silhouette and mediastinal contours are within normal limits. Mildly improved aeration of the right mid lung. Mild bilateral costophrenic angle opacities may represent right-greater-than-left pleural effusions with associated airspace opacity similar to prior. No pneumothorax is seen. No acute skeletal abnormality. IMPRESSION: 1. Interval repositioning of right upper extremity PICC with the  tip now appropriately overlying the mid to inferior aspect of the superior vena cava. 2. Mildly improved aeration of the right midlung. Likely small right-greater-than-left pleural effusions with associated atelectasis versus pneumonia, similar to prior radiographs and 02/22/2022 chest CT. Electronically Signed   By: Yvonne Kendall M.D.   On: 03/01/2022 12:39   DG CHEST PORT 1 VIEW  Result Date: 02/27/2022 CLINICAL DATA:  Shortness of breath EXAM: PORTABLE CHEST 1 VIEW COMPARISON:  02/25/2022 FINDINGS: Check shadow is stable. Feeding catheter has been removed in the interval. The overall inspiratory effort is poor. Basilar atelectatic changes are noted on the right as well as pleural effusion layering posteriorly. Right-sided PICC is noted with the tip in the left innominate vein. This is a change when compared with the prior exam. No bony abnormality is noted. IMPRESSION: Posteriorly layering effusion on the right. Malposition of the right-sided PICC as described. Electronically Signed   By: Inez Catalina M.D.   On: 02/27/2022 22:16   Labs:  Basic Metabolic Panel: Recent Labs  Lab 03/01/22 0408 03/02/22 1435 03/05/22 0533  NA 135 135 136  K 3.5 3.8 3.1*  CL 98 97* 102  CO2 _0 GLUCOSE 88 96 90  BUN _1 CREATININE 0.86 0.80 0.79  CALCIUM 9.6 9.7 9.2    CBC: Recent Labs  Lab 03/01/22 0408 03/02/22 1435 03/05/22 0533  WBC 9.0 8.1 7.4  NEUTROABS 5.3 4.3 4.0  HGB 9.9* 10.5* 10.3*  HCT 30.6* 32.8* 32.1*  MCV 90.5 90.1 88.7  PLT 572* 607* 605*    CBG: Recent Labs  Lab 02/27/22 0643 02/27/22 0801 02/28/22 0758  GLUCAP 89 97 90    Brief HPI:   Paula Lucas is a 31 y.o. female with history of gonorrhea and chlamydia as well as recent admission for septic shock due to pelvic floor abscesses, tubo-ovarian abscesses and multiorgan failure.  She was treated with IV antibiotics and percutaneous  drain placed by radiology.  She was also noted to have numbness on admission  but neurology feels symptoms are consistent with pathology other than confabulation.  06/18 she left AGAINST MEDICAL ADVICE to visit her father's grave as it was Father's Day.  She was readmitted the next day.  She was readmitted on 02/08/22 with nausea vomiting and abdominal pain due to septic shock from follicular abscesses.  She underwent drain placement by IR and subsequently underwent exploratory lap with abdominal washout and drain of abscesses, repair of SB serosa x3 by Dr. Grandville Silos and Dr. Kennon Rounds on 06/27.  Postop treated with Zyvox for MRSA bacteremia, doxycycline, Rocephin as well as Flagyl.    Thoracic MRI done due to back pain and was negative for spinal infection.  Wound VAC was placed overlying incision to help promote wound healing.  JP drains remain in place with use of IV Dilaudid as well as oxycodone and Toradol for pain management.  Core track was placed for nutritional support and discontinued due to intermittent nausea as well as throat irritation.  She did develop acute hypoxic respiratory failure requiring Venturi mask due to volume overload and atelectasis which was being treated with IV Lasix daily.  Persistent sinus tachycardia felt to be due to deconditioning.  She was noted to be deconditioned and CIR was recommended due to functional decline.   Hospital Course: Paula Lucas was admitted to rehab 02/28/2022 for inpatient therapies to consist of PT and OT at least three hours five days a week. Past admission physiatrist, therapy team and rehab RN have worked together to provide customized collaborative inpatient rehab.  She was noted to develop recurrent hypoxia and chest pain in early a.m. on 07/10 requiring supplemental oxygen.  Chest x-ray showed bilateral pleural effusion and she was started on IV Lasix for diuresis.  Respiratory status has improved, she was weaned off oxygen and was discharged to home on low-dose Lasix for 2 weeks.  Follow-up labs shows hypokalemia due to  diuresis and continue to was added for supplementation while on Lasix.  Follow-up appointment set with primary care for posthospital follow-up in 4 weeks with recommendations to recheck BMET on that visit. CBC showed ABLA is resolving.   WOC was consulted for input and has been assisting with wound care and dressing changes.  Midline wound is beefy red, clean and dry wherefore VAC was discontinued and discharged with damp to dry dressing changes to be done on twice daily basis.  Patient has required equal support to help manage anxiety as well as daily encouragement for participation in therapy as well as compliance with medication.  Doxycycline and Flagyl were changed to IV route due to issues with ongoing nausea.  She completed a course of doxycycline and Flagyl and was transitioned to Augmentin prior to discharge.  She continues to have drainage from bilateral abdominal drains and repeat CT abdomen of 07/11 shows small residual collection of more inferior drain diminished in size and no change in rim enhancing pelvic collections.  GYN to follow-up for input on drain management and removal.    ID recommended continuing Augmentin while drains were in place and she is to follow-up with Dr. Gale Journey after discharge. Blood pressures were monitored on TID basis and have been stable.  Low-grade fevers have resolved without additional intervention.  Her p.o. intake is improving in the last 24 hours with resolution of nausea.  Compazine is being used on as needed basis.  She was weaned down to fentanyl 12.5  mg which is to be DC'd after discharge and she continues on oxycodone prn for pain management.  Patient has been educated on dressing changes/drain management.  Unfortunately LCSW was unable to find home health agency to accept patient's insurance but her mother is comfortable with providing wound care as she has done this in the past.  Patient has been making progress and requested that her length of stay be shortened  as she wanted to go to her niece's birthday party.  She has made good gains and currently is at modified independent to supervision level.  She was discharged to home on 03/05/22.    Rehab course: During patient's stay in rehab weekly team conferences were held to monitor patient's progress, set goals and discuss barriers to discharge. At admission, patient required total assist with ADL tasks and CGA with mobility. She  has had improvement in activity tolerance, balance, postural control as well as ability to compensate for deficits.  She has required encouragement for consistent participation during the stay.  She is able to complete ADL tasks at modified independent level.  She is modified independent for transfers and to ambulate 10' with supervision.    Disposition: Home  Diet: Regular  Special Instructions: Right dressing changes to midline wound twice daily Flush drains with 5 cc sterile saline twice daily and document 3.  Repeat BMET/CBC in 2-3 weeks.    Allergies as of 03/05/2022       Reactions   Fish Allergy Anaphylaxis   Severe allergic reaction to all seafoods   Motrin [ibuprofen] Anaphylaxis   Pt tolerated Ketorolac 04/02/18   Mushroom Extract Complex Anaphylaxis   Shellfish-derived Products Anaphylaxis   Tylenol [acetaminophen] Anaphylaxis        Medication List     STOP taking these medications    bisacodyl 10 MG suppository Commonly known as: DULCOLAX   cefTRIAXone  IVPB Commonly known as: ROCEPHIN   doxycycline 100 MG capsule Commonly known as: Vibramycin   DULoxetine 60 MG capsule Commonly known as: CYMBALTA   fentaNYL 25 MCG/HR Commonly known as: DURAGESIC   gabapentin 300 MG capsule Commonly known as: NEURONTIN   metroNIDAZOLE 500 MG tablet Commonly known as: Flagyl   ondansetron 4 MG tablet Commonly known as: ZOFRAN   ondansetron 4 MG/2ML Soln injection Commonly known as: ZOFRAN   polyethylene glycol 17 g packet Commonly known as:  MIRALAX / GLYCOLAX   prochlorperazine 10 MG/2ML injection Commonly known as: COMPAZINE   senna-docusate 8.6-50 MG tablet Commonly known as: Senokot-S   simethicone 80 MG chewable tablet Commonly known as: MYLICON       TAKE these medications    amoxicillin-clavulanate 875-125 MG tablet Commonly known as: AUGMENTIN Take 1 tablet by mouth every 12 (twelve) hours.   cyclobenzaprine 5 MG tablet Commonly known as: FLEXERIL Take 1 tablet (5 mg total) by mouth 3 (three) times daily as needed for muscle spasms. What changed:  medication strength when to take this reasons to take this   furosemide 40 MG tablet Commonly known as: LASIX Take 1 tablet (40 mg total) by mouth daily. Start taking on: March 06, 2022   hydrOXYzine 10 MG tablet Commonly known as: ATARAX Take 1 tablet (10 mg total) by mouth 3 (three) times daily as needed for anxiety.   naloxone 4 MG/0.1ML Liqd nasal spray kit Commonly known as: NARCAN Use 1 spray in the nose as needed.   Oxycodone HCl 10 MG Tabs--Rx# 28 pills.  Take 1 tablet (10 mg total)  by mouth every 6 (six) hours as needed for severe pain. What changed:  medication strength how much to take when to take this reasons to take this   pantoprazole 40 MG tablet Commonly known as: PROTONIX Take 1 tablet (40 mg total) by mouth daily.   potassium chloride 10 MEQ tablet Commonly known as: KLOR-CON M Take 2 tablets (20 mEq total) by mouth 2 (two) times daily.   prochlorperazine 10 MG tablet Commonly known as: COMPAZINE Take 1 tablet (10 mg total) by mouth every 6 (six) hours as needed for nausea or vomiting.   traZODone 50 MG tablet Commonly known as: DESYREL Take 1 tablet (50 mg total) by mouth at bedtime.        Follow-up Information     Jennye Boroughs, MD Follow up.   Specialty: Physical Medicine and Rehabilitation Why: As needed Contact information: [address] [place] [address] [phone]          Jabier Mutton, MD Follow up on 03/12/2022.   Specialty: Infectious Diseases Why: Appointment at 1:45 pm Contact information: [address] [place] [address] [phone]         Griffin Basil, MD Follow up on 03/11/2022.   Specialty: Obstetrics and Gynecology Why: Be there for appointment at 1: 55 pm Contact information: [address] [place] [address] [phone]         Ladell Pier, MD Follow up.   Specialty: Internal Medicine Contact information: [address] [place] [address] [phone]                 Signed: Bary Leriche 03/05/2022, 7:05 PM

## 2022-03-04 NOTE — Progress Notes (Addendum)
Patient temp 100.6, rechecked and 100.3 contacted on call Riley Lam and advised to encourage patient to increase oral fluids. Pt in no distress or discomfort at this time.   Pt temp increased to 100.8 contacted on call Eunice orders placed for chest xray, Toradol, blood cultures and urine culture. Temp rechecked and decreased to 99.8. No other issues to report at this time.

## 2022-03-05 ENCOUNTER — Other Ambulatory Visit (HOSPITAL_COMMUNITY): Payer: Self-pay

## 2022-03-05 DIAGNOSIS — R7989 Other specified abnormal findings of blood chemistry: Secondary | ICD-10-CM | POA: Diagnosis not present

## 2022-03-05 DIAGNOSIS — D62 Acute posthemorrhagic anemia: Secondary | ICD-10-CM

## 2022-03-05 DIAGNOSIS — R5381 Other malaise: Secondary | ICD-10-CM | POA: Diagnosis not present

## 2022-03-05 DIAGNOSIS — R0602 Shortness of breath: Secondary | ICD-10-CM | POA: Diagnosis not present

## 2022-03-05 DIAGNOSIS — E876 Hypokalemia: Secondary | ICD-10-CM

## 2022-03-05 DIAGNOSIS — S31109D Unspecified open wound of abdominal wall, unspecified quadrant without penetration into peritoneal cavity, subsequent encounter: Secondary | ICD-10-CM

## 2022-03-05 DIAGNOSIS — J90 Pleural effusion, not elsewhere classified: Secondary | ICD-10-CM

## 2022-03-05 DIAGNOSIS — K750 Abscess of liver: Secondary | ICD-10-CM | POA: Diagnosis not present

## 2022-03-05 LAB — CBC WITH DIFFERENTIAL/PLATELET
Abs Immature Granulocytes: 0.02 10*3/uL (ref 0.00–0.07)
Basophils Absolute: 0.1 10*3/uL (ref 0.0–0.1)
Basophils Relative: 1 %
Eosinophils Absolute: 0.4 10*3/uL (ref 0.0–0.5)
Eosinophils Relative: 5 %
HCT: 32.1 % — ABNORMAL LOW (ref 36.0–46.0)
Hemoglobin: 10.3 g/dL — ABNORMAL LOW (ref 12.0–15.0)
Immature Granulocytes: 0 %
Lymphocytes Relative: 32 %
Lymphs Abs: 2.4 10*3/uL (ref 0.7–4.0)
MCH: 28.5 pg (ref 26.0–34.0)
MCHC: 32.1 g/dL (ref 30.0–36.0)
MCV: 88.7 fL (ref 80.0–100.0)
Monocytes Absolute: 0.5 10*3/uL (ref 0.1–1.0)
Monocytes Relative: 7 %
Neutro Abs: 4 10*3/uL (ref 1.7–7.7)
Neutrophils Relative %: 55 %
Platelets: 605 10*3/uL — ABNORMAL HIGH (ref 150–400)
RBC: 3.62 MIL/uL — ABNORMAL LOW (ref 3.87–5.11)
RDW: 18.1 % — ABNORMAL HIGH (ref 11.5–15.5)
WBC: 7.4 10*3/uL (ref 4.0–10.5)
nRBC: 0 % (ref 0.0–0.2)

## 2022-03-05 LAB — COMPREHENSIVE METABOLIC PANEL
ALT: 33 U/L (ref 0–44)
AST: 20 U/L (ref 15–41)
Albumin: 2.9 g/dL — ABNORMAL LOW (ref 3.5–5.0)
Alkaline Phosphatase: 79 U/L (ref 38–126)
Anion gap: 10 (ref 5–15)
BUN: 12 mg/dL (ref 6–20)
CO2: 24 mmol/L (ref 22–32)
Calcium: 9.2 mg/dL (ref 8.9–10.3)
Chloride: 102 mmol/L (ref 98–111)
Creatinine, Ser: 0.79 mg/dL (ref 0.44–1.00)
GFR, Estimated: >60 mL/min (ref >60)
Glucose, Bld: 90 mg/dL (ref 70–99)
Potassium: 3.1 mmol/L — ABNORMAL LOW (ref 3.5–5.1)
Sodium: 136 mmol/L (ref 135–145)
Total Bilirubin: 0.8 mg/dL (ref 0.3–1.2)
Total Protein: 8.5 g/dL — ABNORMAL HIGH (ref 6.5–8.1)

## 2022-03-05 LAB — URINE CULTURE
Culture: NO GROWTH
Special Requests: NORMAL

## 2022-03-05 LAB — CULTURE, BLOOD (ROUTINE X 2)
Culture: NO GROWTH
Culture: NO GROWTH
Special Requests: ADEQUATE
Special Requests: ADEQUATE

## 2022-03-05 MED ORDER — POTASSIUM CHLORIDE CRYS ER 20 MEQ PO TBCR
20.0000 meq | EXTENDED_RELEASE_TABLET | Freq: Two times a day (BID) | ORAL | Status: DC
  Administered 2022-03-05: 20 meq via ORAL
  Filled 2022-03-05: qty 1

## 2022-03-05 MED ORDER — POTASSIUM CHLORIDE CRYS ER 10 MEQ PO TBCR
20.0000 meq | EXTENDED_RELEASE_TABLET | Freq: Two times a day (BID) | ORAL | 0 refills | Status: DC
  Filled 2022-03-05: qty 28, 7d supply, fill #0

## 2022-03-05 MED ORDER — FUROSEMIDE 40 MG PO TABS
40.0000 mg | ORAL_TABLET | Freq: Every day | ORAL | 0 refills | Status: DC
  Filled 2022-03-05: qty 14, 14d supply, fill #0

## 2022-03-05 MED ORDER — POTASSIUM CHLORIDE CRYS ER 20 MEQ PO TBCR
30.0000 meq | EXTENDED_RELEASE_TABLET | Freq: Once | ORAL | Status: AC
  Administered 2022-03-05: 30 meq via ORAL
  Filled 2022-03-05: qty 1

## 2022-03-05 NOTE — Progress Notes (Signed)
PROGRESS NOTE   Subjective/Complaints: Afebrile today. Wound Vac to be removed today. She is scheduled for discharge today. Potassium was low today.    Review of Systems  Constitutional:  Positive for fever. Negative for chills.  Eyes: Negative.   Respiratory:  Negative for shortness of breath.   Cardiovascular:  Negative for chest pain and palpitations.  Gastrointestinal:  Positive for abdominal pain. Negative for nausea.  Neurological:  Positive for weakness.      Objective:   DG Chest 2 View  Result Date: 03/04/2022 CLINICAL DATA:  Fever EXAM: CHEST - 2 VIEW COMPARISON:  03/01/2022 chest radiograph. FINDINGS: Right PICC terminates in the upper third of the SVC. Low lung volumes. Stable cardiomediastinal silhouette with normal heart size. No pneumothorax. Stable small moderate right pleural effusion. Small left pleural effusion, decreased. Bilateral lower lobes dense opacity and volume loss, unchanged. IMPRESSION: 1. Stable small to moderate right pleural effusion. 2. Small left pleural effusion, decreased. 3. Stable bilateral lower lobe dense opacity and volume loss, either atelectasis or pneumonia. Electronically Signed   By: Delbert Phenix M.D.   On: 03/04/2022 08:14   Recent Labs    03/02/22 1435 03/05/22 0533  WBC 8.1 7.4  HGB 10.5* 10.3*  HCT 32.8* 32.1*  PLT 607* 605*    Recent Labs    03/02/22 1435 03/05/22 0533  NA 135 136  K 3.8 3.1*  CL 97* 102  CO2 27 24  GLUCOSE 96 90  BUN 19 12  CREATININE 0.80 0.79  CALCIUM 9.7 9.2     Intake/Output Summary (Last 24 hours) at 03/05/2022 0845 Last data filed at 03/05/2022 0759 Gross per 24 hour  Intake 120 ml  Output --  Net 120 ml         Physical Exam: Vital Signs Blood pressure 108/69, pulse (!) 103, temperature 98.3 F (36.8 C), resp. rate 17, height 5\' 3"  (1.6 m), weight 65 kg, last menstrual period 10/26/2021, SpO2 94 %, unknown if currently  breastfeeding.  Physical Exam Vitals and nursing note reviewed.  Constitutional:      Appearance: She is normal weight.  HENT:     Head: Normocephalic and atraumatic.  Eyes:     Extraocular Movements: Extraocular movements intact.     Conjunctiva/sclera: Conjunctivae normal.     Pupils: Pupils are equal, round, and reactive to light.  Cardiovascular:     Rate and Rhythm: Normal rate and regular rhythm.     Heart sounds: Normal heart sounds.  Pulmonary:     Effort: Pulmonary effort is normal. No respiratory distress.     Breath sounds: Normal breath sounds. No stridor.  RA Abdominal:     General: Bowel sounds are normal. There is distension.     Tenderness: There is abdominal tenderness-improved Musculoskeletal:     Cervical back: Normal range of motion.     Comments: No pain with upper extremity or lower extremity range of motion  Skin:    Comments: Right lower quadrant drains x2, midline wound VAC, no surrounding erythema, drains with no erythema PICC line right upper arm nontender no erythema Neurological:     Mental Status: She is alert and oriented to person,  place, and time.     Cranial Nerves: Cranial nerves 2-12 are intact. No dysarthria.     Comments: Motor strength is 4/5 bilateral hip flexor knee extensor ankle dorsiflexor 5/5 bilateral deltoid bicep tricep grip Sensation intact light touch bilateral upper and lower limbs Negative straight leg raising     Assessment/Plan: 1. Functional deficits which require 3+ hours per day of interdisciplinary therapy in a comprehensive inpatient rehab setting. Physiatrist is providing close team supervision and 24 hour management of active medical problems listed below. Physiatrist and rehab team continue to assess barriers to discharge/monitor patient progress toward functional and medical goals  Care Tool:  Bathing    Body parts bathed by patient: Face   Body parts bathed by helper: Left arm, Right arm, Chest, Abdomen,  Front perineal area, Buttocks, Right upper leg, Left upper leg, Right lower leg, Left lower leg     Bathing assist Assist Level: Total Assistance - Patient < 25%     Upper Body Dressing/Undressing Upper body dressing   What is the patient wearing?: Hospital gown only    Upper body assist Assist Level: Total Assistance - Patient < 25%    Lower Body Dressing/Undressing Lower body dressing      What is the patient wearing?: Hospital gown only     Lower body assist Assist for lower body dressing: Dependent - Patient 0%     Toileting Toileting    Toileting assist Assist for toileting: Total Assistance - Patient < 25%     Transfers Chair/bed transfer  Transfers assist  Chair/bed transfer activity did not occur: Refused  Chair/bed transfer assist level: Contact Guard/Touching assist     Locomotion Ambulation   Ambulation assist   Ambulation activity did not occur: Refused          Walk 10 feet activity   Assist  Walk 10 feet activity did not occur: Refused        Walk 50 feet activity   Assist Walk 50 feet with 2 turns activity did not occur: Refused         Walk 150 feet activity   Assist Walk 150 feet activity did not occur: Refused         Walk 10 feet on uneven surface  activity   Assist Walk 10 feet on uneven surfaces activity did not occur: Refused         Wheelchair     Assist Is the patient using a wheelchair?: No   Wheelchair activity did not occur: Refused         Wheelchair 50 feet with 2 turns activity    Assist    Wheelchair 50 feet with 2 turns activity did not occur: Refused       Wheelchair 150 feet activity     Assist  Wheelchair 150 feet activity did not occur: Refused       Blood pressure 108/69, pulse (!) 103, temperature 98.3 F (36.8 C), resp. rate 17, height 5\' 3"  (1.6 m), weight 65 kg, last menstrual period 10/26/2021, SpO2 94 %, unknown if currently breastfeeding.   Medical  Problem List and Plan: 1. Functional deficits secondary to debility resulting from pelvic abscesses On IV Flagyll 500mg  po q12h and Ceftriaxone 2g qd until 7/18, Doxycycline 100mg  po BID through 7/11             -patient may not shower             -ELOS/Goals: 7 to 10 days mod I  goals  -PT/OT/SLP evals -7/10 Picc Line replaced today, Flagyl changed to IV, She was discussed with ID -She is declining pain meds, however declining therapy due to pain -7/11 IR to see her today, CMP, CBC,ammonia today -7/12 CT abdomen completed today  -DC picc line  -Will plan for DC tomorrow  -7/13 Fever last night  -She has been transitioned to oral augmentin , flagyl  -DC home today with ID and Gyn followup, she was provided education on drain care 2.  Antithrombotics: -DVT/anticoagulation:  Lovenox 40mg  [place] daily          -antiplatelet therapy: NA 3. Pain Management: fentanyl patch , oxyIR 5mg  Q8 prn, gabapentin 300mg  TID, flexeril 5mg  TID prn  -7/11 Gabapentin to 200mg  TID  7/11 has been refusing gabapentin, will reduce to 100mg  BID  7/12 Dilaudid once 0.5 mg prior to vac change  7/13 fentanyl patch decreased to 12 4. Mood/Sleep: cymbalta 60mg  qd which patient has been refusing             -antipsychotic agents:   -7/10 Cymbalta stopped due to possible nausea 5. Neuropsych/cognition: This patient is capable of making decisions on her own behalf.  Of note is that the pt signed out AMA ~3wks ago  6. Skin/Wound Care: WOC consult for wound vac , WOC following q M-W-F on acute  7/12 Wound care feels wound vac may be removed before discharge 7/14 Vac to be removed today 7. Fluids/Electrolytes/Nutrition:     Latest Ref Rng & Units 03/05/2022    5:33 AM 03/02/2022    2:35 PM 03/01/2022    4:08 AM  BMP  Glucose 70 - 99 mg/dL 90  96  88   BUN 6 - 20 mg/dL 12  19  16    Creatinine 0.44 - 1.00 mg/dL 8/13   9/12   Sodium 135 - 145 mmol/L 136  135  135   Potassium 3.5 - 5.1 mmol/L 3.1  3.8  3.5    Chloride 98 - 111 mmol/L 102  97  98   CO2 22 - 32 mmol/L 24  27  24    Calcium 8.9 - 10.3 mg/dL 9.2  9.7  9.6    SCr Stable at 0.79, follow   8.  Migraines - has had IV compazine and benadryl in past, consider triptan  9.  Constipation dulc supp, Senokot 1 p.o. nightly, MiraLAX twice daily  -DC miralax, senna to PRN due to loose Bms  -7/12 improved 10.  GERD/nausea- protonix, mylicon 80mg  po q 6h prn, Zofran 4 mg IV or p.o. every 6 hours as needed -7/10 protonix changed to IV 11.  Insomnia trazodone 50mg  qhs 12. Elevated LFTs  -hx of liver abscess, continue to monitor  -7/14 resolved 13. Shortness of breath, on O2  -Likely related to fluid overload, she had been getting IV lasix, will continue  -7/12 continues to improve, CT scan yesterday noted R pleural effusion increased from prior exam, continue lasix   7/13 On RA with 96 O2 sat, improved, CXR today stable, lasix PO 14. Fever, improved  -blood cx - no growth, CXR stable  -Afebrile last night 15. Hypokalemia  -KCL today given, recommend outpatient f/u with repeat labs  LOS: 5 days A FACE TO FACE EVALUATION WAS PERFORMED  03/05/2022, 8:45 AM

## 2022-03-05 NOTE — Consult Note (Signed)
WOC Nurse Consult Note: Patient receiving care in [facility] 4M06 NPWT discontinued this AM.  Wound type: midline LQ abdominal surgical incision.  Pressure Injury POA: NA Wound bed: pink, red healthy granulation tissue, healing very well with minimal drainage.  Drainage (amount, consistency, odor) serosanguinous in canister with minimal output Periwound: intact Dressing procedure/placement/frequency: Adhesive remover spray used to remove drape. Removed 1 piece black foam, 1 piece mepitel. Moistened saline gauze placed in the wound, covered with dry gauze and ABD pad. Secured with Medipore tape.   WOC will sign off at this time.    Renaldo Reel Katrinka Blazing, MSN, RN, CMSRN, Angus Seller, [facility] Wound Treatment Associate Pager [phone]

## 2022-03-05 NOTE — Progress Notes (Signed)
Patient ID: Paula Lucas, female   DOB: 17-Jan-1991, 31 y.o.   MRN: [id number] Supplies for discharge gathered for W-D dressing to abd and flushes for JP drains. Patient reports she is knowledgeable to the JP flush procedure; had to complete in the past. Will need review of dressing change process for abd incision/wound before discharge. Pamelia Hoit

## 2022-03-05 NOTE — Progress Notes (Signed)
Inpatient Rehabilitation Care Coordinator Discharge Note   Patient Details  Name: Paula Lucas MRN: [id number] Date of Birth: [birth year]   Discharge location: HOME WITH MOM AND MOM'S CLIENT- MOM CAN PROVIDE 24/7 SUPERVISION DUE TO HER OWN HEALTH ISSUES  Length of Stay: 5 DAYS  Discharge activity level: INDEPENDENT LEVEL  Home/community participation: ACTIVE  Patient response YS:AYTKZS Literacy - How often do you need to have someone help you when you read instructions, pamphlets, or other written material from your doctor or pharmacy?: Never  Patient response WF:UXNATF Isolation - How often do you feel lonely or isolated from those around you?: Never  Services provided included: MD, RD, PT, OT, RN, CM, TR, Pharmacy, SW  Financial Services:  Financial Services Utilized: Medicaid    Choices offered to/list presented to: PT  Follow-up services arranged:  Other (Comment) (UNABLE TO FIND HOME HEALTH AGENCY TO ACCEPT PT'S INSURANCE SO PT AND MOM WILL BE MANAGING JP DRAINS AND WOUND CARE AND HAVE DONE THIS BEFORE AND FEEL CAPABLE OF DOING THIS) GIVEN 2 WEEKS OF DRESSING CHANGES SUPPLIES FOR HER WOUND CARE           Patient response to transportation need: Is the patient able to respond to transportation needs?: Yes In the past 12 months, has lack of transportation kept you from medical appointments or from getting medications?: Yes In the past 12 months, has lack of transportation kept you from meetings, work, or from getting things needed for daily living?: Yes    Comments (or additional information):PT HOPEFULLY WILL BE COMPLIANT WITH HER CARE AND FOLLOW UP APPOINTMENTS, HAS A HISTORY OF BEING NON-COMPLIANT.   Patient/Family verbalized understanding of follow-up arrangements:  Yes  Individual responsible for coordination of the follow-up plan: SELF AND JULIA-MOM [phone]  Confirmed correct DME delivered: [name] 03/05/2022    Ida Milbrath, Lemar Livings

## 2022-03-08 ENCOUNTER — Encounter: Payer: Medicaid Other | Admitting: Obstetrics and Gynecology

## 2022-03-09 LAB — CULTURE, BLOOD (ROUTINE X 2)
Culture: NO GROWTH
Culture: NO GROWTH
Special Requests: ADEQUATE
Special Requests: ADEQUATE

## 2022-03-09 NOTE — Plan of Care (Signed)
  Problem: RH Bed to Chair Transfers Goal: LTG Patient will perform bed/chair transfers w/assist (PT) Description: LTG: Patient will perform bed to chair transfers with assistance (PT). Outcome: Progressing Flowsheets (Taken 03/04/2022 1755) LTG: Pt will perform Bed to Chair Transfers with assistance level: Supervision/Verbal cueing Pt choosing to self-limit d/t pain throughout stay. Choosing to d/c home earlier than recommended.    Problem: RH Ambulation Goal: LTG Patient will ambulate in controlled environment (PT) Description: LTG: Patient will ambulate in a controlled environment, # of feet with assistance (PT). Outcome: Progressing Flowsheets (Taken 03/04/2022 1755) LTG: Pt will ambulate in controlled environ  assist needed:: Supervision/Verbal cueing LTG: Ambulation distance in controlled environment: 10 feet with no AD Pt choosing to self-limit d/t pain throughout stay. Choosing to d/c home earlier than recommended.  Goal: LTG Patient will ambulate in home environment (PT) Description: LTG: Patient will ambulate in home environment, # of feet with assistance (PT). Outcome: Progressing Flowsheets (Taken 03/04/2022 1755) LTG: Pt will ambulate in home environ  assist needed:: Supervision/Verbal cueing LTG: Ambulation distance in home environment: 10 ft with no AD Pt choosing to self-limit d/t pain throughout stay. Choosing to d/c home earlier than recommended.    Problem: RH Stairs Goal: LTG Patient will ambulate up and down stairs w/assist (PT) Description: LTG: Patient will ambulate up and down # of stairs with assistance (PT) Outcome: Adequate for Discharge Flowsheets Taken 03/04/2022 1755 by Alger Simons, PT LTG: Pt will  ambulate up and down number of stairs: 4 steps with BHR Taken 03/01/2022 1341 by Blenda Nicely, PT LTG: Pt will ambulate up/down stairs assist needed:: Supervision/Verbal cueing Pt choosing to self-limit d/t pain throughout stay. Choosing to d/c home earlier  than recommended.    Problem: RH Balance Goal: LTG Patient will maintain dynamic standing balance (PT) Description: LTG:  Patient will maintain dynamic standing balance with assistance during mobility activities (PT) Outcome: Completed/Met Flowsheets (Taken 03/04/2022 1755) LTG: Pt will maintain dynamic standing balance during mobility activities with:: Independent   Problem: Sit to Stand Goal: LTG:  Patient will perform sit to stand with assistance level (PT) Description: LTG:  Patient will perform sit to stand with assistance level (PT) Outcome: Completed/Met Flowsheets (Taken 03/04/2022 1755) LTG: PT will perform sit to stand in preparation for functional mobility with assistance level: Independent   Problem: RH Bed Mobility Goal: LTG Patient will perform bed mobility with assist (PT) Description: LTG: Patient will perform bed mobility with assistance, with/without cues (PT). Outcome: Completed/Met Flowsheets (Taken 03/04/2022 1755) LTG: Pt will perform bed mobility with assistance level of: Independent with assistive device    Problem: RH Car Transfers Goal: LTG Patient will perform car transfers with assist (PT) Description: LTG: Patient will perform car transfers with assistance (PT). Outcome: Completed/Met Flowsheets (Taken 03/04/2022 1755) LTG: Pt will perform car transfers with assist:: Supervision/Verbal cueing

## 2022-03-09 NOTE — Progress Notes (Signed)
Physical Therapy Discharge Summary  Patient Details  Name: Paula Lucas MRN: [id number] Date of Birth: Sep 20, 1990  Today's Date: 03/04/2022  Patient has met 4 of 8 long term goals due to improved activity tolerance, increased strength, and ability to compensate for deficits.  Patient to discharge at an ambulatory level Supervision.   Patient's care partner is independent to provide the necessary  supervisory  assistance at discharge.  Reasons goals not met: Pt choosing to discharge earlier than recommended by care team. Pt self limiting majority of moability and treatment sessions d/t abdominal surgical pain. Pt does not tolerate pain well and demonstrated difficulty managing stress and frustration.   Recommendation:  Patient will benefit from ongoing skilled PT services in home health setting to continue to advance safe functional mobility, address ongoing impairments in strength, pain, balance, activity tolerance, cognition, safety awareness, and to minimize fall risk.  Equipment: No equipment provided  Reasons for discharge: discharge from hospital and pt choosing to leave hospital early despite recommendations for additional treatment and therapy (will not miss niece's party)  Patient/family agrees with progress made and goals achieved: Yes  PT Discharge Precautions/Restrictions Precautions Precautions: Fall Precaution Comments: abd JP x2 drains, watch HR/O2 Restrictions Weight Bearing Restrictions: No Vital Signs  Pain   Pain Interference   Vision/Perception  Vision - History Ability to See in Adequate Light: 0 Adequate Perception Perception: Within Functional Limits Praxis Praxis: Intact  Cognition Overall Cognitive Status: Within Functional Limits for tasks assessed Arousal/Alertness: Awake/alert Orientation Level: Oriented X4 Attention: Focused;Sustained Focused Attention: Impaired Sustained Attention: Impaired Memory: Appears intact Awareness: Appears  intact Problem Solving: Impaired Executive Function: Reasoning Reasoning: Impaired Behaviors: Poor frustration tolerance;Restless Safety/Judgment: Impaired Comments: decreased safety awareness of impact of her decisions (leaving hospital early) and not fully getting her medical treatment to stop infection Sensation Sensation Light Touch: Appears Intact Coordination Gross Motor Movements are Fluid and Coordinated: Yes Fine Motor Movements are Fluid and Coordinated: Yes Coordination and Movement Description: Patient self limits d/t pain with overall decreased motivation Finger Nose Finger Test: [facility] bilaterally Heel Shin Test: DNT - refused Motor  Motor Motor: Within Functional Limits Motor - Discharge Observations: WFL when not self-limiting d/t pain  Mobility Bed Mobility Bed Mobility: Rolling Right;Rolling Left;Sit to Supine;Supine to Sit Rolling Right: Independent with assistive device Rolling Left: Independent with assistive device Supine to Sit: Independent with assistive device Sit to Supine: Independent with assistive device Transfers Transfers: Sit to Stand;Stand to Sit Sit to Stand: Independent Stand to Sit: Independent Stand Pivot Transfers: Supervision/Verbal cueing;Independent Transfer (Assistive device): None Locomotion  Gait Ambulation: Yes (refused) Gait Assistance: Supervision/Verbal cueing Gait Distance (Feet): 10 Feet Assistive device: None Gait Gait: Yes (refused) Gait Pattern: Within Functional Limits (limited d/t pain) Gait velocity: Decreased Stairs / Additional Locomotion Stairs: Yes (refused) Stairs Assistance: Supervision/Verbal cueing Stair Management Technique: Two rails Number of Stairs: 4 Height of Stairs: 6 Wheelchair Mobility Wheelchair Mobility: No (refused)  Trunk/Postural Assessment  Cervical Assessment Cervical Assessment: Within Functional Limits Thoracic Assessment Thoracic Assessment: Exceptions to [facility] (rounded  shoulders) Lumbar Assessment Lumbar Assessment: Exceptions to [facility] (sits in post pelvic tilt) Postural Control Postural Control: Within Functional Limits  Balance Balance Balance Assessed: Yes Static Sitting Balance Static Sitting - Balance Support: Feet unsupported;Bilateral upper extremity supported Static Sitting - Level of Assistance: 6: Modified independent (Device/Increase time) Dynamic Sitting Balance Sitting balance - Comments: Supervision Static Standing Balance Static Standing - Balance Support: No upper extremity supported;During functional activity Static Standing - Level of  Assistance: 7: Independent Dynamic Standing Balance Dynamic Standing - Balance Support: No upper extremity supported;During functional activity Dynamic Standing - Level of Assistance: 5: Stand by assistance Extremity Assessment      RLE Assessment RLE Assessment: Exceptions to [facility] General Strength Comments: not formally tested - refused - grossly generalized to 4/5 LLE Assessment LLE Assessment: Exceptions to [facility] General Strength Comments: not formally assessed - refused - grossly generalized to 4/5    Alger Simons 03/08/2022, 7:51 PM

## 2022-03-11 ENCOUNTER — Other Ambulatory Visit: Payer: Self-pay

## 2022-03-11 ENCOUNTER — Ambulatory Visit (INDEPENDENT_AMBULATORY_CARE_PROVIDER_SITE_OTHER): Payer: Medicaid Other | Admitting: Obstetrics and Gynecology

## 2022-03-11 ENCOUNTER — Encounter: Payer: Self-pay | Admitting: Obstetrics and Gynecology

## 2022-03-11 VITALS — BP 118/86 | HR 127 | Wt 147.5 lb

## 2022-03-11 DIAGNOSIS — G8918 Other acute postprocedural pain: Secondary | ICD-10-CM

## 2022-03-11 DIAGNOSIS — R109 Unspecified abdominal pain: Secondary | ICD-10-CM | POA: Diagnosis not present

## 2022-03-11 MED ORDER — GABAPENTIN 300 MG PO CAPS: 300.0000 mg | ORAL_CAPSULE | Freq: Three times a day (TID) | ORAL | 1 refills | Status: DC

## 2022-03-11 MED ORDER — OXYCODONE HCL 5 MG PO TABS: 5.0000 mg | ORAL_TABLET | ORAL | 0 refills | Status: DC | PRN

## 2022-03-11 NOTE — Progress Notes (Signed)
    Subjective:    Paula Lucas is a 31 y.o. female who presents to the clinic status post exploratory laparotomy with pelvic washout on 02/16/22.  She had a prolonged recovery and hospital stay due to the extensiveness of the infection. Pain is controlled with current analgesics. Medications being used: narcotic analgesics including oxycodone and gabapentin.  Eating a regular diet without difficulty. Bowel movements are normal. Pt still has two JP drains and a midline incision which is healing from secondary intention  The following portions of the patient's history were reviewed and updated as appropriate: allergies, current medications, past family history, past medical history, past social history, past surgical history, and problem list..  Pt declined pap smear today due to remaining discomfort.  Review of Systems Pertinent items are noted in HPI.   Objective:   BP 118/86   Pulse (!) 127   Wt 147 lb 8 oz (66.9 kg)   LMP 10/26/2021   BMI 26.13 kg/m  Constitutional:  Well-developed, well-nourished female in no acute distress.   Skin: Skin is warm and dry, no rash noted, not diaphoretic,no erythema, no pallor.  Cardiovascular: Normal heart rate noted  Respiratory: Effort and breath sounds normal, no problems with respiration noted  Abdomen: Soft, bowel sounds active, non-tender, no abnormal masses  Incision: Midline incision with 2 wet to dry guaze pads.  Tissue was pink and healthy, no sign of infection., two JP drains noted in RLQ.  Pelvic:   Deferred    Assessment:   Postoperative course complicated by continued closure of midline incision and pain   Operative findings again reviewed. Pathology report discussed.   Plan:   1. Continue any current medications. Rx for oxycodone and gabapentin 2. Wound care discussed. 3. Activity restrictions: no driving, no lifting more than 10 pounds, and pelvic rest 4. Anticipated return to work:  1-2 months.  Pt still has continued  healing . 5. Follow up in 1 week for drain removal if cleared by ID, she has followup with infectious disease on 03/12/22 6.  Routine preventative health maintenance measures emphasized. Please refer to After Visit Summary for other counseling recommendations.    Paula Aloe, MD, FACOG Attending Obstetrician & Gynecologist Center for [facility], [facility]

## 2022-03-12 ENCOUNTER — Other Ambulatory Visit: Payer: Self-pay

## 2022-03-12 ENCOUNTER — Ambulatory Visit (INDEPENDENT_AMBULATORY_CARE_PROVIDER_SITE_OTHER): Payer: Medicaid Other | Admitting: Internal Medicine

## 2022-03-12 ENCOUNTER — Encounter: Payer: Self-pay | Admitting: Internal Medicine

## 2022-03-12 VITALS — BP 101/72 | HR 142 | Temp 99.9°F | Ht 63.0 in | Wt 147.0 lb

## 2022-03-12 DIAGNOSIS — K651 Peritoneal abscess: Secondary | ICD-10-CM | POA: Diagnosis present

## 2022-03-12 MED ORDER — AMOXICILLIN-POT CLAVULANATE 875-125 MG PO TABS: 1.0000 | ORAL_TABLET | Freq: Two times a day (BID) | ORAL | 0 refills | Status: DC

## 2022-03-12 NOTE — Patient Instructions (Signed)
Continue amoxicillin-clavulonate for another 4 weeks (until we ok with stopping)  Once the abscess is gone on ct scan or stable and your labs look normal again would be another criteria potentially for stopping antibiotics   Follow up with your gynecologist to seek wound care and drain management   See [place] in 3-4 weeks

## 2022-03-12 NOTE — Progress Notes (Signed)
[facility]  Patient Active Problem List   Diagnosis Date Noted   Hypokalemia 03/05/2022   Pleural effusion 03/05/2022   Acute blood loss anemia 03/05/2022   Debility 02/28/2022   Depression 02/23/2022   Protein-calorie malnutrition (HCC)    Hx of Fitz-Hugh-Curtis syndrome    Hepatic abscess 02/15/2022   MRSA (methicillin resistant staph aureus) culture positive    Sepsis (HCC) 02/04/2022   Pelvic abscess        Subjective:    Patient ID: Paula Lucas, female    DOB: 1991/02/13, 31 y.o.   MRN: [id number]  Chief Complaint  Patient presents with   Follow-up    HPI:  Paula Lucas is a 31 y.o. female here for hospital f/u intraabd abscess  Urine gc/chlam pcr negative. Hx std several months back Required exlap with I&D and 2 drain placement due to inability to get to all abscess D/c'ed on 3 weeks ceftriaxone/flagyl and 2 weeks doxy until 7/18 and 7/10 respectively  03/12/22 Transitioned to amox-clav Saw gyn yesterday Complains of jp drain site pain No f/c/n/v/diarrhea No vaginal discharge Wound of exlap incision healing nicely secondarily  Out of all wound supply She does wound care herself    Allergies  Allergen Reactions   Fish Allergy Anaphylaxis    Severe allergic reaction to all seafoods   Motrin [Ibuprofen] Anaphylaxis    Pt tolerated Ketorolac 04/02/18   Mushroom Extract Complex Anaphylaxis   Shellfish-Derived Products Anaphylaxis   Tylenol [Acetaminophen] Anaphylaxis      Outpatient Medications Prior to Visit  Medication Sig Dispense Refill   cyclobenzaprine (FLEXERIL) 5 MG tablet Take 1 tablet (5 mg total) by mouth 3 (three) times daily as needed for muscle spasms. 45 tablet 0   furosemide (LASIX) 40 MG tablet Take 1 tablet (40 mg total) by mouth daily. 14 tablet 0   gabapentin (NEURONTIN) 300 MG capsule Take 1 capsule (300 mg total) by mouth 3 (three) times daily. 30 capsule 1   hydrOXYzine (ATARAX) 10  MG tablet Take 1 tablet (10 mg total) by mouth 3 (three) times daily as needed for anxiety. 30 tablet 0   oxyCODONE (ROXICODONE) 5 MG immediate release tablet Take 1 tablet (5 mg total) by mouth every 4 (four) hours as needed for severe pain. 30 tablet 0   pantoprazole (PROTONIX) 40 MG tablet Take 1 tablet (40 mg total) by mouth daily. 30 tablet 0   prochlorperazine (COMPAZINE) 10 MG tablet Take 1 tablet (10 mg total) by mouth every 6 (six) hours as needed for nausea or vomiting. 45 tablet 0   traZODone (DESYREL) 50 MG tablet Take 1 tablet (50 mg total) by mouth at bedtime. 15 tablet 0   amoxicillin-clavulanate (AUGMENTIN) 875-125 MG tablet Take 1 tablet by mouth every 12 (twelve) hours. 28 tablet 0   naloxone (NARCAN) nasal spray 4 mg/0.1 mL Use 1 spray in the nose as needed. (Patient not taking: Reported on 03/12/2022) 2 each 0   potassium chloride (KLOR-CON M) 10 MEQ tablet Take 2 tablets (20 mEq total) by mouth 2 (two) times daily. (Patient not taking: Reported on 03/12/2022) 28 tablet 0   No facility-administered medications prior to visit.     Social History   Socioeconomic History   Marital status: Single    Spouse name: Not on file   Number of children: Not on file   Years of education: Not on file   Highest education level: Not on  file  Occupational History   Occupation: UNEMPLOYED  Tobacco Use   Smoking status: Former    Packs/day: 0.50    Years: 9.00    Total pack years: 4.50    Types: Cigarettes   Smokeless tobacco: Never  Substance and Sexual Activity   Alcohol use: No   Drug use: Not Currently    Types: Marijuana    Comment: THC GUMMIES LAST MONTH   Sexual activity: Yes  Other Topics Concern   Not on file  Social History Narrative   Not on file   Social Determinants of Health   Financial Resource Strain: Medium Risk (04/01/2018)   Overall Financial Resource Strain (CARDIA)    Difficulty of Paying Living Expenses: Somewhat hard  Food Insecurity: Food Insecurity  Present (04/01/2018)   Hunger Vital Sign    Worried About Running Out of Food in the Last Year: Sometimes true    Ran Out of Food in the Last Year: Sometimes true  Transportation Needs: Unmet Transportation Needs (04/01/2018)   PRAPARE - Administrator, Civil Service (Medical): Yes    Lack of Transportation (Non-Medical): Yes  Physical Activity: Insufficiently Active (04/01/2018)   Exercise Vital Sign    Days of Exercise per Week: 1 day    Minutes of Exercise per Session: 120 min  Stress: No Stress Concern Present (04/01/2018)   [employer] of Occupational Health - Occupational Stress Questionnaire    Feeling of Stress : Not at all  Social Connections: Unknown (04/01/2018)   Social Connection and Isolation Panel [NHANES]    Frequency of Communication with Friends and Family: Once a week    Frequency of Social Gatherings with Friends and Family: Once a week    Attends Religious Services: Not on Insurance claims handler of Clubs or Organizations: No    Attends Banker Meetings: Never    Marital Status: Never married  Intimate Partner Violence: Not At Risk (04/01/2018)   Humiliation, Afraid, Rape, and Kick questionnaire    Fear of Current or Ex-Partner: No    Emotionally Abused: No    Physically Abused: No    Sexually Abused: No      Review of Systems     Objective:    BP 101/72   Pulse (!) 142   Temp 99.9 F (37.7 C) (Oral)   Ht 5\' 3"  (1.6 m)   Wt 147 lb (66.7 kg) Comment: stated, in w/c  LMP 10/26/2021   SpO2 94%   BMI 26.04 kg/m  Nursing note and vital signs reviewed.  Physical Exam     General/constitutional: no distress, pleasant; here on wheel chair along with relative HEENT: Normocephalic, PER, Conj Clear, EOMI, Oropharynx clear Neck supple CV: rrr no mrg Lungs: clear to auscultation, normal respiratory effort Ext: no edema Skin/abd: 2 jp drain with slightly murky serosanguinous fluid; the incision showed me picture from today good  granulation tissue no undermining no purulence no surrounding erythema; abd is soft and is tender mildly around jp site without purulence at sites Neuro: nonfocal   Labs: Lab Results  Component Value Date   WBC 7.4 03/05/2022   HGB 10.3 (L) 03/05/2022   HCT 32.1 (L) 03/05/2022   MCV 88.7 03/05/2022   PLT 605 (H) 03/05/2022   Last metabolic panel Lab Results  Component Value Date   GLUCOSE 90 03/05/2022   NA 136 03/05/2022   K 3.1 (L) 03/05/2022   CL 102 03/05/2022   CO2 24 03/05/2022  BUN 12 03/05/2022   CREATININE 0.79 03/05/2022   GFRNONAA >60 03/05/2022   CALCIUM 9.2 03/05/2022   PHOS 4.0 02/21/2022   PROT 8.5 (H) 03/05/2022   ALBUMIN 2.9 (L) 03/05/2022   BILITOT 0.8 03/05/2022   ALKPHOS 79 03/05/2022   AST 20 03/05/2022   ALT 33 03/05/2022   ANIONGAP 10 03/05/2022   No results found for: "CRP"  Micro:  Serology:  Imaging: Reviewed and incorporated into decision making.  7/11 abd pelv ct with contrast 1. No fluid collection adjacent to the more superior right lower quadrant drain. Small residual collection adjacent to the more inferior drain has diminished in size from prior exam with residual collection measuring 2.2 x 1.2 cm. 2. Rim enhancing pelvic fluid collections persist, midline collection unchanged in size 3.7 x 3.1 cm. Right pelvic collection posteriorly at site of previous drainage catheter measures 2.4 x 1.9 cm. 3. Decreased size of perihepatic fluid collections. 4. Moderate right pleural effusion which has increased from prior exam. Small left pleural effusion. Associated compressive atelectasis. 5. Increased number of small retroperitoneal and central mesenteric lymph nodes, likely reactive.  Assessment & Plan:   Problem List Items Addressed This Visit   None Visit Diagnoses     Intra-abdominal abscess (HCC)    -  Primary   Relevant Orders   CT ABDOMEN PELVIS W CONTRAST      Patient refused labs today Some pain at the insertion  site otherwise clinically and by the recent abd ct I am not concerned about worsening infection  She needs wound care supply/flushes which I advise to discuss with gynecology/surgery team. Our office has saline flushes so we gave her 10 of the syringes  She would need repeat ct again and labs for ongoing determination of when it is feasible to stop antibiotics and remove the jp drains  At least from the stability standpoint and the size of the fluid collection from 7/11 ct scan (I am not sure how much the drain is putting out still) and if drains output is minimal (surgery can determine) I think it would be reasonable to remove the drains (from my prior experience with other surgical patients with drains   -ct scan ordered for another week from now -continue abx; 30 more days amox-clav given -f/u gynecology for ongoing wound related care/questions -she refused labs today -f/u 4 weeks with [facility] clinic    I have spent a total of 40 minutes of face-to-face and non-face-to-face time, excluding clinical staff time, preparing to see patient, ordering tests and/or medications, and provide counseling the patient   Follow-up: Return in about 4 weeks (around 04/09/2022).      Raymondo Band, MD [facility] 03/12/2022, 2:26 PM

## 2022-03-13 ENCOUNTER — Emergency Department (HOSPITAL_COMMUNITY): Payer: Medicaid Other

## 2022-03-13 ENCOUNTER — Encounter (HOSPITAL_COMMUNITY): Payer: Self-pay | Admitting: Emergency Medicine

## 2022-03-13 ENCOUNTER — Inpatient Hospital Stay (HOSPITAL_COMMUNITY)
Admission: EM | Admit: 2022-03-13 | Discharge: 2022-03-16 | DRG: 371 | Disposition: A | Discharge status: Home / Self Care | Payer: Medicaid Other | Attending: Internal Medicine | Admitting: Internal Medicine

## 2022-03-13 ENCOUNTER — Other Ambulatory Visit: Payer: Self-pay

## 2022-03-13 DIAGNOSIS — N739 Female pelvic inflammatory disease, unspecified: Secondary | ICD-10-CM | POA: Diagnosis not present

## 2022-03-13 DIAGNOSIS — Z87891 Personal history of nicotine dependence: Secondary | ICD-10-CM

## 2022-03-13 DIAGNOSIS — Z6826 Body mass index (BMI) 26.0-26.9, adult: Secondary | ICD-10-CM

## 2022-03-13 DIAGNOSIS — U071 COVID-19: Secondary | ICD-10-CM | POA: Diagnosis present

## 2022-03-13 DIAGNOSIS — Z9102 Food additives allergy status: Secondary | ICD-10-CM

## 2022-03-13 DIAGNOSIS — J45909 Unspecified asthma, uncomplicated: Secondary | ICD-10-CM | POA: Diagnosis present

## 2022-03-13 DIAGNOSIS — Z886 Allergy status to analgesic agent status: Secondary | ICD-10-CM

## 2022-03-13 DIAGNOSIS — R109 Unspecified abdominal pain: Secondary | ICD-10-CM

## 2022-03-13 DIAGNOSIS — Z79899 Other long term (current) drug therapy: Secondary | ICD-10-CM

## 2022-03-13 DIAGNOSIS — E876 Hypokalemia: Secondary | ICD-10-CM | POA: Diagnosis present

## 2022-03-13 DIAGNOSIS — K651 Peritoneal abscess: Principal | ICD-10-CM

## 2022-03-13 DIAGNOSIS — E46 Unspecified protein-calorie malnutrition: Secondary | ICD-10-CM | POA: Diagnosis present

## 2022-03-13 DIAGNOSIS — R509 Fever, unspecified: Secondary | ICD-10-CM | POA: Diagnosis present

## 2022-03-13 DIAGNOSIS — E440 Moderate protein-calorie malnutrition: Secondary | ICD-10-CM | POA: Diagnosis present

## 2022-03-13 DIAGNOSIS — Z91013 Allergy to seafood: Secondary | ICD-10-CM

## 2022-03-13 DIAGNOSIS — G8918 Other acute postprocedural pain: Secondary | ICD-10-CM

## 2022-03-13 LAB — URINALYSIS, ROUTINE W REFLEX MICROSCOPIC
Bilirubin Urine: NEGATIVE
Glucose, UA: NEGATIVE mg/dL
Hgb urine dipstick: NEGATIVE
Ketones, ur: NEGATIVE mg/dL
Leukocytes,Ua: NEGATIVE
Nitrite: NEGATIVE
Protein, ur: 30 mg/dL — AB
Specific Gravity, Urine: 1.04 — ABNORMAL HIGH (ref 1.005–1.030)
pH: 5 (ref 5.0–8.0)

## 2022-03-13 LAB — CBC WITH DIFFERENTIAL/PLATELET
Abs Immature Granulocytes: 0.05 10*3/uL (ref 0.00–0.07)
Basophils Absolute: 0 10*3/uL (ref 0.0–0.1)
Basophils Relative: 0 %
Eosinophils Absolute: 0 10*3/uL (ref 0.0–0.5)
Eosinophils Relative: 0 %
HCT: 32.5 % — ABNORMAL LOW (ref 36.0–46.0)
Hemoglobin: 10.5 g/dL — ABNORMAL LOW (ref 12.0–15.0)
Immature Granulocytes: 0 %
Lymphocytes Relative: 31 %
Lymphs Abs: 3.6 10*3/uL (ref 0.7–4.0)
MCH: 28.6 pg (ref 26.0–34.0)
MCHC: 32.3 g/dL (ref 30.0–36.0)
MCV: 88.6 fL (ref 80.0–100.0)
Monocytes Absolute: 0.8 10*3/uL (ref 0.1–1.0)
Monocytes Relative: 6 %
Neutro Abs: 7.3 10*3/uL (ref 1.7–7.7)
Neutrophils Relative %: 63 %
Platelets: 438 10*3/uL — ABNORMAL HIGH (ref 150–400)
RBC: 3.67 MIL/uL — ABNORMAL LOW (ref 3.87–5.11)
RDW: 17.7 % — ABNORMAL HIGH (ref 11.5–15.5)
WBC: 11.7 10*3/uL — ABNORMAL HIGH (ref 4.0–10.5)
nRBC: 0 % (ref 0.0–0.2)

## 2022-03-13 LAB — COMPREHENSIVE METABOLIC PANEL
ALT: 23 U/L (ref 0–44)
AST: 26 U/L (ref 15–41)
Albumin: 2.9 g/dL — ABNORMAL LOW (ref 3.5–5.0)
Alkaline Phosphatase: 61 U/L (ref 38–126)
Anion gap: 10 (ref 5–15)
BUN: <5 mg/dL — ABNORMAL LOW (ref 6–20)
CO2: 22 mmol/L (ref 22–32)
Calcium: 8.4 mg/dL — ABNORMAL LOW (ref 8.9–10.3)
Chloride: 102 mmol/L (ref 98–111)
Creatinine, Ser: 0.63 mg/dL (ref 0.44–1.00)
GFR, Estimated: >60 mL/min (ref >60)
Glucose, Bld: 111 mg/dL — ABNORMAL HIGH (ref 70–99)
Potassium: 3.1 mmol/L — ABNORMAL LOW (ref 3.5–5.1)
Sodium: 134 mmol/L — ABNORMAL LOW (ref 135–145)
Total Bilirubin: 0.5 mg/dL (ref 0.3–1.2)
Total Protein: 9.4 g/dL — ABNORMAL HIGH (ref 6.5–8.1)

## 2022-03-13 LAB — PROTIME-INR
INR: 1.3 — ABNORMAL HIGH (ref 0.8–1.2)
Prothrombin Time: 16 seconds — ABNORMAL HIGH (ref 11.4–15.2)

## 2022-03-13 LAB — LACTIC ACID, PLASMA: Lactic Acid, Venous: 0.7 mmol/L (ref 0.5–1.9)

## 2022-03-13 LAB — RESP PANEL BY RT-PCR (FLU A&B, COVID) ARPGX2
Influenza A by PCR: NEGATIVE
Influenza B by PCR: NEGATIVE
SARS Coronavirus 2 by RT PCR: POSITIVE — AB

## 2022-03-13 LAB — I-STAT BETA HCG BLOOD, ED (MC, WL, AP ONLY): I-stat hCG, quantitative: <5 m[IU]/mL (ref <5)

## 2022-03-13 LAB — APTT: aPTT: 34 seconds (ref 24–36)

## 2022-03-13 MED ORDER — METHOCARBAMOL 1000 MG/10ML IJ SOLN
500.0000 mg | Freq: Four times a day (QID) | INTRAVENOUS | Status: DC | PRN
  Administered 2022-03-13 – 2022-03-14 (×2): 500 mg via INTRAVENOUS
  Filled 2022-03-13 (×2): qty 500

## 2022-03-13 MED ORDER — LACTATED RINGERS IV SOLN
INTRAVENOUS | Status: AC
Start: 2022-03-13 — End: 2022-03-14

## 2022-03-13 MED ORDER — HYDROMORPHONE HCL 1 MG/ML IJ SOLN
1.0000 mg | Freq: Once | INTRAMUSCULAR | Status: AC
  Administered 2022-03-13: 1 mg via INTRAVENOUS
  Filled 2022-03-13: qty 1

## 2022-03-13 MED ORDER — SODIUM CHLORIDE 0.9 % IV SOLN
2.0000 g | Freq: Once | INTRAVENOUS | Status: AC
  Administered 2022-03-13: 2 g via INTRAVENOUS
  Filled 2022-03-13: qty 12.5

## 2022-03-13 MED ORDER — LACTATED RINGERS IV BOLUS (SEPSIS)
1000.0000 mL | Freq: Once | INTRAVENOUS | Status: AC
  Administered 2022-03-13: 1000 mL via INTRAVENOUS

## 2022-03-13 MED ORDER — KETOROLAC TROMETHAMINE 30 MG/ML IJ SOLN
15.0000 mg | Freq: Once | INTRAMUSCULAR | Status: AC
  Administered 2022-03-13: 15 mg via INTRAVENOUS
  Filled 2022-03-13: qty 1

## 2022-03-13 MED ORDER — METRONIDAZOLE 500 MG/100ML IV SOLN
500.0000 mg | Freq: Once | INTRAVENOUS | Status: AC
  Administered 2022-03-13: 500 mg via INTRAVENOUS
  Filled 2022-03-13: qty 100

## 2022-03-13 MED ORDER — METRONIDAZOLE 500 MG/100ML IV SOLN
500.0000 mg | Freq: Two times a day (BID) | INTRAVENOUS | Status: DC
  Administered 2022-03-13 – 2022-03-16 (×7): 500 mg via INTRAVENOUS
  Filled 2022-03-13 (×7): qty 100

## 2022-03-13 MED ORDER — LACTATED RINGERS IV BOLUS (SEPSIS)
250.0000 mL | Freq: Once | INTRAVENOUS | Status: AC
  Administered 2022-03-13: 250 mL via INTRAVENOUS

## 2022-03-13 MED ORDER — HYDROMORPHONE HCL 1 MG/ML IJ SOLN
1.0000 mg | INTRAMUSCULAR | Status: DC | PRN
  Administered 2022-03-13 (×4): 1 mg via INTRAVENOUS
  Filled 2022-03-13 (×4): qty 1

## 2022-03-13 MED ORDER — IOHEXOL 300 MG/ML  SOLN
100.0000 mL | Freq: Once | INTRAMUSCULAR | Status: AC | PRN
  Administered 2022-03-13: 100 mL via INTRAVENOUS

## 2022-03-13 MED ORDER — ONDANSETRON HCL 4 MG PO TABS: 4.0000 mg | ORAL_TABLET | Freq: Four times a day (QID) | ORAL | Status: DC | PRN

## 2022-03-13 MED ORDER — HYDROMORPHONE HCL 1 MG/ML IJ SOLN
1.0000 mg | INTRAMUSCULAR | Status: DC | PRN
  Administered 2022-03-13 – 2022-03-16 (×18): 1 mg via INTRAVENOUS
  Filled 2022-03-13 (×18): qty 1

## 2022-03-13 MED ORDER — SENNOSIDES-DOCUSATE SODIUM 8.6-50 MG PO TABS: 1.0000 | ORAL_TABLET | Freq: Every evening | ORAL | Status: DC | PRN

## 2022-03-13 MED ORDER — ENOXAPARIN SODIUM 40 MG/0.4ML IJ SOSY
40.0000 mg | PREFILLED_SYRINGE | INTRAMUSCULAR | Status: DC
  Administered 2022-03-13 – 2022-03-15 (×3): 40 mg via SUBCUTANEOUS
  Filled 2022-03-13 (×4): qty 0.4

## 2022-03-13 MED ORDER — SODIUM CHLORIDE 0.9 % IV SOLN
2.0000 g | Freq: Three times a day (TID) | INTRAVENOUS | Status: DC
  Administered 2022-03-13 – 2022-03-16 (×10): 2 g via INTRAVENOUS
  Filled 2022-03-13 (×11): qty 12.5

## 2022-03-13 MED ORDER — ONDANSETRON HCL 4 MG/2ML IJ SOLN
4.0000 mg | Freq: Four times a day (QID) | INTRAMUSCULAR | Status: DC | PRN
  Administered 2022-03-14 – 2022-03-16 (×3): 4 mg via INTRAVENOUS
  Filled 2022-03-13 (×3): qty 2

## 2022-03-13 MED ORDER — OXYCODONE HCL 5 MG PO TABS
10.0000 mg | ORAL_TABLET | ORAL | Status: DC | PRN
  Administered 2022-03-13 – 2022-03-15 (×7): 10 mg via ORAL
  Filled 2022-03-13 (×8): qty 2

## 2022-03-13 MED ORDER — OXYCODONE HCL 5 MG PO TABS
5.0000 mg | ORAL_TABLET | ORAL | Status: DC | PRN
  Administered 2022-03-13: 5 mg via ORAL
  Filled 2022-03-13: qty 1

## 2022-03-13 NOTE — ED Triage Notes (Signed)
Pt in via [facility] as code sepsis, exp lap surgery on 7/12 for 3 pelvic abcesses. 2 JP drains present, pt reports worsened pain, fever and less output from drains. States she is still taking her PO abx at home. Temp 102.3, HR 135, BP 70/50 on EMS arrival.

## 2022-03-13 NOTE — ED Notes (Signed)
Admitting MD at [facility].  

## 2022-03-13 NOTE — ED Provider Notes (Signed)
I see the patient in signout from Dr. Clayborne Dana, briefly the patient is a 31 year old female with a chief complaint of abdominal pain and fever.  Patient recently admitted for her multiple intra-abdominal abscesses currently on Augmentin therapy plan for lab work CT scan and likely admission.  Patient is COVID-positive.  No obvious COVID symptoms were endorsed to me.  CT abdomen pelvis with some worsening inflammation but no significant change to her fluid collections per radiology.  Discussed with Dr. Macon Large,  gyn.  They will consult on the patient in the hospital.  I discussed with internal medicine teaching service for admission.     Melene Plan, DO 03/13/22 1136

## 2022-03-13 NOTE — Sepsis Progress Note (Signed)
Elink following Code Sepsis. 

## 2022-03-13 NOTE — Consult Note (Signed)
OBSTETRICS AND GYNECOLOGY ATTENDING CONSULT NOTE  Consult Date: 03/13/2022 Reason for Consult: Fevers, abdominal pain, known abdominal/pelvic collections s/p IR drainage x 2 then ELA, pelvic washout on 02/16/22 Consulting Provider: Dr. Melene Plan   Assessment/Plan: On review of imaging, the size of the abdominal and pelvic collections are not significantly changed compared to the CT scan done 03/02/22 prior to discharge. Her persistent inflammation and extension into upper abdomen is unfortunately not unexpected.  Patient is already on a long course of antibiotic therapy (30 more days recommended by ID during a visit on 03/12/22), no surgical intervention needed.  There is concern about her presenting temperature of 102.3, and mildly elevated WBC of 11, but patient was also noted to be positive for COVID infection.  She has been afebrile since that initial fever.  For now, she can continue on Cefepime and Metronidazole as ordered until she at least 48 hours afebrile, and then she can be switched back to Augmentin to complete course as outlined by ID. ID can be consulted if patient continues to have fevers for further help with adjusting her regimen.  There is no indication for any gynecologic surgical intervention or other acute intervention from our team at this point.  She can continue wet-to-dry incisional dressing changes daily with the help of the nursing team or Wound Care team; her midline incision is currently healing from secondary intention.  Her JP drains' output can continue to be monitored while inpatient, and we can be called to remove them if output is minimal.  Of note, if surgery is ever indicated for her in the future, she would need GYN ONC or transfer to a tertiary institution given the complexity of her collections and associated adhesive disease seen during her surgery,and significantly increased risk of visceral injury.   Appreciate care of Paula Lucas by her primary team We  will follow along as needed and be available for any gynecologic questions and concerns.  Please call [phone] [facility] OB/GYN Consult Attending Monday-Friday 8am - 5pm) or [phone] [facility] OB/GYN Attending On Call all day, every day) for any gynecologic concerns at any time.  Thank you for involving [place] in the care of this patient.  Total consultation time including face-to-face time with patient (>50% of time), reviewing chart and documentation: 45 minutes  Jaynie Collins, MD, FACOG Obstetrician & Gynecologist, [facility] for [employer], [facility]    History of Present Illness: Paula Lucas is an 31 y.o. [id number] female who was recently admitted for a complicated course due to multiple abdominal and pelvic collections.   Over the last month, she was admitted to ICU for sepsis, underwent attempted IR drainage of collections x 2, then had a ELAP and pelvic washout by our gynecology team and General Surgery.  She required multiple antibiotics, coordinated by ID. She had significant issues with pain control and nutrition, but was eventually able to be discharged on 03/05/22 to rehab with wound dressing and JP drains in place. She was on prolonged course of antibiotics as per ID.  Patient was seen in our office for followup on 03/11/22, there were no acute concerns then and she was also seen by ID on 03/12/22 and the plan was for 30 more days of Augmentin as prescribed.  She came to the [facility] ED this morning reporting  increasing pain, abdominal distention and fevers and chills.  On evaluation, she was noted to have a temperature of 102.3, pulse of 134, respiratory  rate of 31 and Code Sepsis was activated. She received IV fluids and  repeat CT scan showed persistent abdominal and pelvic abscesses, largest about 4-5 cm in size.  WBC was noted to be 11.7, increased from 7.4 at discharge, normal lactic acid at 0.7. Hemoglobin was 10.5.  She was also noted to be COVID  positive and placed on droplet precautions; she has no respiratory symptoms. She was started on IV Cefepime and Metronidazole and our service was consulted.   On encounter with patient, she reports diffuse abdomina pain but no other concerning symptoms.  Pertinent OB/GYN History: Patient's last menstrual period was 10/26/2021. OB History  Gravida Para Term Preterm AB Living  2 0 0 0 1 0  SAB IAB Ectopic Multiple Live Births  0 1 0 0 0    # Outcome Date GA Lbr Len/2nd Weight Sex Delivery Anes PTL Lv  2 Gravida           1 IAB           History of PID/TOA after gonorrhea and chlamydia infections.  Patient Active Problem List   Diagnosis Date Noted   Fever 03/13/2022   Abdominal pain 03/13/2022   Hypokalemia 03/05/2022   Pleural effusion 03/05/2022   Acute blood loss anemia 03/05/2022   Debility 02/28/2022   Depression 02/23/2022   Protein-calorie malnutrition (HCC)    Hx of Fitz-Hugh-Curtis syndrome    Hepatic abscess 02/15/2022   MRSA (methicillin resistant staph aureus) culture positive    Sepsis (HCC) 02/04/2022   Pelvic abscess      Past Medical History:  Diagnosis Date   Asthma    BMI 31.0-31.9,adult    Chlamydia    Gonorrhea    TOA (tubo-ovarian abscess)     Past Surgical History:  Procedure Laterality Date   INDUCED ABORTION     IR RADIOLOGIST EVAL & MGMT  05/17/2018   LAPAROTOMY N/A 02/16/2022   Procedure: EXPLORATORY LAPAROTOMY WITH ABDOMINAL WASHOUT;  Surgeon: Reva Bores, MD;  Location[facility] OR;  Service: Gynecology;  Laterality: N/A;   SMALL BOWEL REPAIR N/A 02/16/2022   Procedure: SMALL BOWEL REPAIR;  Surgeon: Violeta Gelinas, MD;  Location: [facility] OR;  Service: General;  Laterality: N/A;   TONSILLECTOMY      Family History  Problem Relation Age of Onset   Diabetes Mother    Hypertension Mother    Diabetes Father    Hypertension Father    Diabetes Sister    Hypertension Sister     Social History:  reports that she has quit smoking. Her smoking use  included cigarettes. She has a 4.50 pack-year smoking history. She has never used smokeless tobacco. She reports that she does not currently use drugs after having used the following drugs: Marijuana. She reports that she does not drink alcohol.  Allergies:  Allergies  Allergen Reactions   Fish Allergy Anaphylaxis    Severe allergic reaction to all seafoods   Motrin [Ibuprofen] Anaphylaxis    Pt tolerated Ketorolac 04/02/18 Pt tolerated toradol during hosp admission in July 2023   Mushroom Extract Complex Anaphylaxis   Shellfish-Derived Products Anaphylaxis   Tylenol [Acetaminophen] Anaphylaxis    Medications: I have reviewed the patient's current medications. Prior to Admission: (Not in a hospital admission)   Review of Systems: Pertinent items noted in HPI and remainder of comprehensive ROS otherwise negative.  Focused Physical Examination: BP 95/70   Pulse 89   Temp 98.5 F (36.9 C) (Oral)   Resp 18  Wt 66.7 kg   LMP 10/26/2021   SpO2 94%   BMI 26.04 kg/m  CONSTITUTIONAL: Well-developed, well-nourished female in no acute distress.  NEUROLOGIC: Alert and oriented to person, place, and time. Normal reflexes, muscle tone coordination. No cranial nerve deficit noted. PSYCHIATRIC: Normal mood and affect. Normal behavior. Normal judgment and thought content. CARDIOVASCULAR: Normal heart rate noted, regular rhythm RESPIRATORY: Clear to auscultation bilaterally. Effort and breath sounds normal, no problems with respiration noted. ABDOMEN: Soft, normal bowel sounds, mild distention noted.  Moderate diffuse tenderness, but no rebound or guarding. Two JP drains in place draining mostly serosanguinous fluid.   Midline incision noted with guaze in place, healing well, no active drainage, no significant erythema or induration PELVIC: Deferred MUSCULOSKELETAL: Normal range of motion. No tenderness.  No cyanosis, clubbing, or edema.  2+ distal pulses.  Labs and Imaging: Results for  orders placed or performed during the hospital encounter of 03/13/22 (from the past 72 hour(s))  Lactic acid, plasma     Status: None   Collection Time: 03/13/22  6:05 AM  Result Value Ref Range   Lactic Acid, Venous 0.7 0.5 - 1.9 mmol/L    Comment: Performed at [facility], [address], [place], [place] [address]  Comprehensive metabolic panel     Status: Abnormal   Collection Time: 03/13/22  6:05 AM  Result Value Ref Range   Sodium 134 (L) 135 - 145 mmol/L   Potassium 3.1 (L) 3.5 - 5.1 mmol/L   Chloride 102 98 - 111 mmol/L   CO2 22 22 - 32 mmol/L   Glucose, Bld 111 (H) 70 - 99 mg/dL    Comment: Glucose reference range applies only to samples taken after fasting for at least 8 hours.   BUN <5 (L) 6 - 20 mg/dL   Creatinine, Ser 2.95 0.44 - 1.00 mg/dL   Calcium 8.4 (L) 8.9 - 10.3 mg/dL   Total Protein 9.4 (H) 6.5 - 8.1 g/dL   Albumin 2.9 (L) 3.5 - 5.0 g/dL   AST 26 15 - 41 U/L   ALT 23 0 - 44 U/L   Alkaline Phosphatase 61 38 - 126 U/L   Total Bilirubin 0.5 0.3 - 1.2 mg/dL   GFR, Estimated >62 >13 mL/min    Comment: (NOTE) Calculated using the CKD-EPI Creatinine Equation (2021)    Anion gap 10 5 - 15    Comment: Performed at [facility], [address] [place], [place] [address]  CBC with Differential     Status: Abnormal   Collection Time: 03/13/22  6:05 AM  Result Value Ref Range   WBC 11.7 (H) 4.0 - 10.5 K/uL   RBC 3.67 (L) 3.87 - 5.11 MIL/uL   Hemoglobin 10.5 (L) 12.0 - 15.0 g/dL   HCT 84.6 (L) 96.2 - 95.2 %   MCV 88.6 80.0 - 100.0 fL   MCH 28.6 26.0 - 34.0 pg   MCHC 32.3 30.0 - 36.0 g/dL   RDW 84.1 (H) 32.4 - 40.1 %   Platelets 438 (H) 150 - 400 K/uL   nRBC 0.0 0.0 - 0.2 %   Neutrophils Relative % 63 %   Neutro Abs 7.3 1.7 - 7.7 K/uL   Lymphocytes Relative 31 %   Lymphs Abs 3.6 0.7 - 4.0 K/uL   Monocytes Relative 6 %   Monocytes Absolute 0.8 0.1 - 1.0 K/uL   Eosinophils Relative 0 %   Eosinophils Absolute 0.0 0.0 - 0.5 K/uL   Basophils Relative  0 %   Basophils Absolute 0.0 0.0 - 0.1 K/uL   Immature Granulocytes 0 %   Abs Immature Granulocytes 0.05 0.00 - 0.07 K/uL    Comment: Performed at [facility], [address], [place], [place] [address]  Protime-INR     Status: Abnormal   Collection Time: 03/13/22  6:30 AM  Result Value Ref Range   Prothrombin Time 16.0 (H) 11.4 - 15.2 seconds   INR 1.3 (H) 0.8 - 1.2    Comment: (NOTE) INR goal varies based on device and disease states. Performed at [facility], [address] [place], [place] [address]   APTT     Status: None   Collection Time: 03/13/22  6:30 AM  Result Value Ref Range   aPTT 34 24 - 36 seconds    Comment: Performed at [facility], [address] [place], [place] [address]  Resp Panel by RT-PCR (Flu A&B, Covid) Anterior Nasal Swab     Status: Abnormal   Collection Time: 03/13/22  6:35 AM   Specimen: Anterior Nasal Swab  Result Value Ref Range   SARS Coronavirus 2 by RT PCR POSITIVE (A) NEGATIVE    Comment: (NOTE) SARS-CoV-2 target nucleic acids are DETECTED.  The SARS-CoV-2 RNA is generally detectable in upper respiratory specimens during the acute phase of infection. Positive results are indicative of the presence of the identified virus, but do not rule out bacterial infection or co-infection with other pathogens not detected by the test. Clinical correlation with patient history and other diagnostic information is necessary to determine patient infection status. The expected result is Negative.  Fact Sheet for Patients: BloggerCourse.com  Fact Sheet for Healthcare Providers: SeriousBroker.it  This test is not yet approved or cleared by the [place] FDA and  has been authorized for detection and/or diagnosis of SARS-CoV-2 by FDA under an Emergency Use Authorization (EUA).  This EUA will remain in effect (meaning this test can be used) for the duration of  the COVID-19  declaration under Section 564(b)(1) of the A ct, 21 U.S.C. section 360bbb-3(b)(1), unless the authorization is terminated or revoked sooner.     Influenza A by PCR NEGATIVE NEGATIVE   Influenza B by PCR NEGATIVE NEGATIVE    Comment: (NOTE) The Xpert Xpress SARS-CoV-2/FLU/RSV plus assay is intended as an aid in the diagnosis of influenza from Nasopharyngeal swab specimens and should not be used as a sole basis for treatment. Nasal washings and aspirates are unacceptable for Xpert Xpress SARS-CoV-2/FLU/RSV testing.  Fact Sheet for Patients: BloggerCourse.com  Fact Sheet for Healthcare Providers: SeriousBroker.it  This test is not yet approved or cleared by the [place] FDA and has been authorized for detection and/or diagnosis of SARS-CoV-2 by FDA under an Emergency Use Authorization (EUA). This EUA will remain in effect (meaning this test can be used) for the duration of the COVID-19 declaration under Section 564(b)(1) of the Act, 21 U.S.C. section 360bbb-3(b)(1), unless the authorization is terminated or revoked.  Performed at [facility], [address] [place], [place] [address]   I-Stat beta hCG blood, ED     Status: None   Collection Time: 03/13/22  6:36 AM  Result Value Ref Range   I-stat hCG, quantitative <5.0 <5 mIU/mL   Comment 3            Comment:   GEST. AGE      CONC.  (mIU/mL)   <=1 WEEK  5 - 50     2 WEEKS       50 - 500     3 WEEKS       100 - 10,000     4 WEEKS     1,000 - 30,000        FEMALE AND NON-PREGNANT FEMALE:     LESS THAN 5 mIU/mL     CT ABDOMEN PELVIS W CONTRAST  Result Date: 03/13/2022 CLINICAL DATA:  Postop abdominal pain. Status post laparotomy and small-bowel repair on 02/16/2022. EXAM: CT ABDOMEN AND PELVIS WITH CONTRAST TECHNIQUE: Multidetector CT imaging of the abdomen and pelvis was performed using the standard protocol following bolus administration of intravenous  contrast. RADIATION DOSE REDUCTION: This exam was performed according to the departmental dose-optimization program which includes automated exposure control, adjustment of the mA and/or kV according to patient size and/or use of iterative reconstruction technique. CONTRAST:  100mL OMNIPAQUE IOHEXOL 300 MG/ML  SOLN COMPARISON:  03/02/2022. FINDINGS: Lower chest: Persistent bilateral pleural effusions, right greater than left. These appear mildly decreased in volume from previous exam. Overlying atelectasis is again noted. Hepatobiliary: There is no suspicious liver abnormality. Gallbladder is unremarkable. No significant bile duct dilatation. Pancreas: Unremarkable. No pancreatic ductal dilatation or surrounding inflammatory changes. Spleen: Normal in size without focal abnormality. Adrenals/Urinary Tract: Normal adrenal glands. The kidneys are unremarkable. No nephrolithiasis, hydronephrosis or mass. Urinary bladder appears unremarkable. Stomach/Bowel: Stomach appears within normal limits. The appendix is not confidently identified separate from right lower quadrant bowel loops and inflammatory change. No pathologic dilatation of the large or small bowel loops identified to suggest bowel obstruction. Progressive inflammatory fat stranding surrounds nondilated loops of small bowel which are difficult to categorize due to lack of IV contrast material. Suspect new/progressive secondary inflammation of the small bowel. Vascular/Lymphatic: Normal caliber abdominal aorta. Prominent retroperitoneal and bilateral pelvic lymph nodes are likely reactive. Reproductive: The uterus is unremarkable. Stable appearance of the uterus. Ovaries are difficult to differentiate from the extensive inflammatory changes within the pelvis. Other: A percutaneous drainage catheter enters the right ventral abdominal wall coursing along the undersurface of the ventral abdominal wall and terminating along the midline undersurface of the  supraumbilical ventral abdominal wall, image 39/3. Compared with the previous exam there is progressive inflammatory changes along the intra-course of the drainage catheter with marked increased soft tissue stranding. No definite fluid collection identified within this area however. A second percutaneous drainage catheter enters the right lower quadrant ventral abdominal wall with tip terminating just above the midline dome of bladder, image 70/3. There has been interval increase in diffuse inflammatory changes along the course of this catheter. The previously noted peripherally enhancing fluid collection overlying the inferior right hepatic lobe measures 2.7 x 5.2 cm, image 40/3. This is compared with 4.6 x 1.1 cm previously. Left-sided peripherally enhancing fluid collection within the pelvis measures 4.2 x 2.7 by 3.3 cm (volume = 20 cm^3), image 69/6. On the previous exam this measured 3.7 x 3.2 by 2.9 cm (volume = 18 cm^3). Smaller fluid collection within the right posterior pelvis is less conspicuous on today's study measuring 1.2 x 0.8 cm, image 75/3. Previously 2.4 x 1.9 cm. Persistent and progressive diffuse mesenteric and peritoneal inflammation/edema. Which now extends into the upper abdomen with progressive involvement of the nondilated small bowel loops. Musculoskeletal: No acute or significant osseous findings. IMPRESSION: 1. Persistent and progressive diffuse mesenteric and peritoneal inflammation/. There is progressive extension into the upper abdomen with increased involvement of nondilated  small bowel loops. Increased inflammation with inflammatory fat stranding/infiltration along the intra-abdominal course of both percutaneous drainage catheters. 2. Stable to mild increase in size of left-sided peripherally enhancing pelvic fluid collection within the pelvis. Right posterior pelvic fluid collection and the fluid collection overlying the inferior right hepatic lobe have decreased in volume from  previous exam. 3. Mild decrease in volume of bilateral pleural effusions with overlying atelectasis. Electronically Signed   By: Signa Kell M.D.   On: 03/13/2022 10:23   DG Chest Port 1 View  Result Date: 03/13/2022 CLINICAL DATA:  Questionable sepsis EXAM: PORTABLE CHEST 1 VIEW COMPARISON:  03/04/2022 FINDINGS: Asymmetric retrocardiac density also seen on prior. Lung volumes are low. Borderline heart size. No visible effusion or pneumothorax. Artifact from EKG leads IMPRESSION: Low volume chest with retrocardiac opacity that has also been seen on recent priors and favors atelectasis or scar. Electronically Signed   By: Tiburcio Pea M.D.   On: 03/13/2022 06:24

## 2022-03-13 NOTE — Hospital Course (Addendum)
Still having stomach pain in the same location. She has been feeling nauseous and was vomiting yesterday. Has not been able to eat anything. Thinks that the drains need to come out.   Had some lip swelling yesterday. Also has some bumps in her mouth. ___________________________________________________  States she has been in a lot of pain, mainly around her JP drains. She was in relatively good health up until yesterday. Her pain began yesterday after she went to go see her infectious disease doctors. They were messing around with her JP drains and pressing her stomach, and she began having a lot of pain after leaving the visit. It has been continuous pain since that time. When she woke up last night, she could not move as she was in so much pain. Pain is sharp and around drain sites.   The day before yesterday, she went to see her OBGYN and she was feeling great.  Her temperature was 99.49F when she went to the ID doctor. She has not been feeling hot since yesterday.  She is having regular BM, but not hard. They have been this way since her last hospitalization. They have been brown, no blood noted.  She has not been having any symptoms from COVID. No coughing, congestion, headaches. No difficulty breathing or chest pain.   She has been on oxycodone 5mg  per OBGYN along with gabapentin. Dr. , ID, placed her on augmentin.

## 2022-03-13 NOTE — ED Provider Notes (Signed)
[facility] EMERGENCY DEPARTMENT Provider Note   CSN: [id number] Arrival date & time: 03/13/22  0556     History  Chief Complaint  Patient presents with   Code Sepsis    Paula Lucas is a 31 y.o. female.  Patient recently admitted to the hospital and discharged to rehab for a complicated course.  Reviewing discharge summary and records last hospital showed that the patient initially came into the IV antibiotics with a drain in place from IR and ICU and left AMA.  She came back again less than 24 hours later was readmitted at that time her course was complicated by multiple abscesses requiring operative washout.  She had significant issues with pain control, nutrition.  Work-up negative for any other causes and ultimately improved enough to go to rehab where she was at her around.  Patient went home a little over a week ago and has been doing okay for the last 24 to 36 hours she had increasing pain, abdominal distention and fevers and chills.  She saw infectious disease yesterday without any obvious changes at that time.        Home Medications Prior to Admission medications   Medication Sig Start Date End Date Taking? Authorizing Provider  amoxicillin-clavulanate (AUGMENTIN) 875-125 MG tablet Take 1 tablet by mouth 2 (two) times daily. 03/12/22 04/11/22  Vu, Tonita Phoenix, MD  cyclobenzaprine (FLEXERIL) 5 MG tablet Take 1 tablet (5 mg total) by mouth 3 (three) times daily as needed for muscle spasms. 03/04/22   Love, Evlyn Kanner, PA-C  furosemide (LASIX) 40 MG tablet Take 1 tablet (40 mg total) by mouth daily. 03/06/22   Love, Evlyn Kanner, PA-C  gabapentin (NEURONTIN) 300 MG capsule Take 1 capsule (300 mg total) by mouth 3 (three) times daily. 03/11/22   Warden Fillers, MD  hydrOXYzine (ATARAX) 10 MG tablet Take 1 tablet (10 mg total) by mouth 3 (three) times daily as needed for anxiety. 03/04/22   Love, Evlyn Kanner, PA-C  naloxone [facility]) nasal spray 4 mg/0.1 mL Use 1 spray in  the nose as needed. Patient not taking: Reported on 03/12/2022 03/04/22   Love, Evlyn Kanner, PA-C  oxyCODONE (ROXICODONE) 5 MG immediate release tablet Take 1 tablet (5 mg total) by mouth every 4 (four) hours as needed for severe pain. 03/11/22   Warden Fillers, MD  pantoprazole (PROTONIX) 40 MG tablet Take 1 tablet (40 mg total) by mouth daily. 03/04/22   Love, Evlyn Kanner, PA-C  potassium chloride (KLOR-CON M) 10 MEQ tablet Take 2 tablets (20 mEq total) by mouth 2 (two) times daily. Patient not taking: Reported on 03/12/2022 03/05/22   Love, Evlyn Kanner, PA-C  prochlorperazine (COMPAZINE) 10 MG tablet Take 1 tablet (10 mg total) by mouth every 6 (six) hours as needed for nausea or vomiting. 03/04/22   Love, Evlyn Kanner, PA-C  traZODone (DESYREL) 50 MG tablet Take 1 tablet (50 mg total) by mouth at bedtime. 03/04/22   Love, Evlyn Kanner, PA-C      Allergies    Fish allergy, Motrin [ibuprofen], Mushroom extract complex, Shellfish-derived products, and Tylenol [acetaminophen]    Review of Systems   Review of Systems  Physical Exam Updated Vital Signs BP 105/70   Pulse (!) 114   Temp (!) 102.3 F (39.1 C)   Resp (!) 21   Wt 66.7 kg   LMP 10/26/2021   SpO2 98%   BMI 26.04 kg/m  Physical Exam Vitals and nursing note reviewed.  Constitutional:  Appearance: She is well-developed.  HENT:     Head: Normocephalic and atraumatic.     Mouth/Throat:     Mouth: Mucous membranes are moist.     Pharynx: Oropharynx is clear.  Eyes:     Pupils: Pupils are equal, round, and reactive to light.  Cardiovascular:     Rate and Rhythm: Normal rate and regular rhythm.  Pulmonary:     Effort: No respiratory distress.     Breath sounds: No stridor.  Abdominal:     General: Abdomen is flat. There is distension.     Tenderness: There is abdominal tenderness.     Comments: Two JP drains in place draining mostly serosanguinous fluid, some mild surrounding erythema with induration.   Musculoskeletal:        General:  No swelling or tenderness. Normal range of motion.     Cervical back: Normal range of motion.  Skin:    General: Skin is warm and dry.  Neurological:     General: No focal deficit present.     Mental Status: She is alert.     ED Results / Procedures / Treatments   Labs (all labs ordered are listed, but only abnormal results are displayed) Labs Reviewed  CBC WITH DIFFERENTIAL/PLATELET - Abnormal; Notable for the following components:      Result Value   WBC 11.7 (*)    RBC 3.67 (*)    Hemoglobin 10.5 (*)    HCT 32.5 (*)    RDW 17.7 (*)    Platelets 438 (*)    All other components within normal limits  PROTIME-INR - Abnormal; Notable for the following components:   Prothrombin Time 16.0 (*)    INR 1.3 (*)    All other components within normal limits  RESP PANEL BY RT-PCR (FLU A&B, COVID) ARPGX2  CULTURE, BLOOD (ROUTINE X 2)  CULTURE, BLOOD (ROUTINE X 2)  URINE CULTURE  LACTIC ACID, PLASMA  APTT  LACTIC ACID, PLASMA  COMPREHENSIVE METABOLIC PANEL  URINALYSIS, ROUTINE W REFLEX MICROSCOPIC  I-STAT BETA HCG BLOOD, ED ([facility], [facility], [facility] ONLY)    EKG EKG Interpretation  Date/Time:  Saturday March 13 2022 05:58:04 EDT Ventricular Rate:  134 PR Interval:  145 QRS Duration: 71 QT Interval:  278 QTC Calculation: 415 R Axis:   63 Text Interpretation: Sinus tachycardia Confirmed by Marily Memos [phone]) on 03/13/2022 6:41:45 AM  Radiology DG Chest Port 1 View  Result Date: 03/13/2022 CLINICAL DATA:  Questionable sepsis EXAM: PORTABLE CHEST 1 VIEW COMPARISON:  03/04/2022 FINDINGS: Asymmetric retrocardiac density also seen on prior. Lung volumes are low. Borderline heart size. No visible effusion or pneumothorax. Artifact from EKG leads IMPRESSION: Low volume chest with retrocardiac opacity that has also been seen on recent priors and favors atelectasis or scar. Electronically Signed   By: Tiburcio Pea M.D.   On: 03/13/2022 06:24    Procedures .Critical Care  Performed by:  Marily Memos, MD Authorized by: Marily Memos, MD   Critical care provider statement:    Critical care time (minutes):  30   Critical care was necessary to treat or prevent imminent or life-threatening deterioration of the following conditions:  Sepsis   Critical care was time spent personally by me on the following activities:  Development of treatment plan with patient or surrogate, discussions with consultants, evaluation of patient's response to treatment, examination of patient, ordering and review of laboratory studies, ordering and review of radiographic studies, ordering and performing treatments and interventions, pulse oximetry, re-evaluation  of patient's condition and review of old charts     Medications Ordered in ED Medications  lactated ringers infusion (has no administration in time range)  metroNIDAZOLE (FLAGYL) IVPB 500 mg (500 mg Intravenous New Bag/Given 03/13/22 0701)  lactated ringers bolus 1,000 mL (1,000 mLs Intravenous New Bag/Given 03/13/22 0617)    And  lactated ringers bolus 1,000 mL (1,000 mLs Intravenous New Bag/Given 03/13/22 0646)    And  lactated ringers bolus 250 mL (has no administration in time range)  HYDROmorphone (DILAUDID) injection 1 mg (1 mg Intravenous Given 03/13/22 0618)  ceFEPIme (MAXIPIME) 2 g in sodium chloride 0.9 % 100 mL IVPB (0 g Intravenous Stopped 03/13/22 0716)  ketorolac (TORADOL) 30 MG/ML injection 15 mg (15 mg Intravenous Given 03/13/22 2542)    ED Course/ Medical Decision Making/ A&P                           Medical Decision Making Amount and/or Complexity of Data Reviewed Labs: ordered. Radiology: ordered. ECG/medicine tests: ordered.  Risk Prescription drug management.   Sepsis likely recurrent or persistent abdominal abscesses. Started cefepime and flagyl, fluids and ordered CT. Care transferred to Dr. Adela Lank pending completion of workup and disposition.    Final Clinical Impression(s) / ED Diagnoses Final diagnoses:   None    Rx / DC Orders ED Discharge Orders     None         Olia Hinderliter, Barbara Cower, MD 03/13/22 [phone]

## 2022-03-13 NOTE — H&P (Signed)
Date: 03/13/2022               Patient Name:  Paula Lucas MRN: [id number]  DOB: June 29, 1991 Age / Sex: 31 y.o., female   PCP: Pcp, No         Medical Service: Internal Medicine Teaching Service         Attending Physician: Dr. Oswaldo Done, Marquita Palms, *    First Contact: Adron Bene, MD Pager: Myriam Jacobson [phone]  Second Contact:  Merrilyn Puma, MD Pager:  Nada Maclachlan [phone]       After Hours (After 5p/  First Contact Pager: [phone]  weekends / holidays): Second Contact Pager: [phone]   SUBJECTIVE   Chief Complaint: fever, abdominal pain  History of Present Illness: Patient with history of gonorrhea and chlamydia recently admitted for septic shock due to tubo-ovarian abscesses, and multiorgan failure now s/p ex lap with washout and JP drain placement.  She was discharged to CIR with wound VAC and JP drains in place with continued doxycycline, Rocephin and Flagyl po.  She required IV Dilaudid as well as oxycodone and Toradol for pain management.  She required cortrak for nutritional support but discontinued due to intermittent nausea and throat irritation.  She did develop acute hypoxic respiratory failure requiring Venturi mask due to volume overload and atelectasis treated with IV Lasix daily.  She had persistent sinus tachycardia felt to be due to deconditioning and was recommended for CIR due to functional decline.  She was discharged to CIR with fentanyl patch and prn oxycodone.   Wound care followed her during CIR where wound VAC was discontinued.  Had to transition back to IV antibiotics due to nausea but she completed course of doxycycline and Flagyl.  Was transition to Augmentin prior to discharge.  She continued to have drainage from bilateral JP drains and repeat CT abdomen on 7/11 showed small residual collection of more inferior drain diminished in size and no change in rim-enhancing pelvic collections. Discharge with GYN follow-up for input on drain management removal.  ID recommended  continuing Augmentin while drains were in place.  She followed up with GYN on 7/20 who noted course complicated by continued closure of midline incision and pain.  She followed up with Dr. Renold Don 7/21 and was noted to have some pain at the insertion site.  Recommended repeat CT scan in 1 week and if drain output minimal at that time would be reasonable to remove drains.  Recommended continuing antibiotics with 30 more days of Augmentin.  Who chose to leave drains in place. She has been on oxycodone 5mg  per OBGYN along with gabapentin. Dr. , ID, placed her on augmentin.   Today, patient reports abdominal pain 8/10 that began yesterday after for her ID appointment and worsened through the night at which point she was prompted to call EMS.  The pain is mainly around her JP drains.  She states that she was not in pain leading up to appointment yesterday.  She states the pain has been continuous since that time.  She states when she woke up in pain last night she could not move.  The pain is sharp in nature.  She endorses normal bowel movements without blood.  she denies any fevers before arriving to the ED.  She is aware that she has been found positive for COVID.  She denies diarrhea, nausea, vomiting, cough, headache, nasal congestion, shortness of breath, chest pain.  She endorses strict compliance with medication regimen.  ED Course: Patient with fever to  102.3 but afebrile since.  Initially tachycardic but this has resolved.  Normotensive.  Sating well on room air.  Lactic acid of 0.7.  Mild leukocytosis of 11.7.  Blood cultures pending.  COVID-positive.  UA, urine culture pending.  CXR with no acute changes.  CTAP w/ contrast demonstrated worsening inflammation but no significant change to fluid collections.  Started on cefepime and Flagyl.  Received 2.25L LR + LR 152mL/hr maintenance. GYN consulted.  Meds:  Current Meds  Medication Sig   amoxicillin-clavulanate (AUGMENTIN) 875-125 MG tablet Take 1 tablet  by mouth 2 (two) times daily.   cyclobenzaprine (FLEXERIL) 5 MG tablet Take 1 tablet (5 mg total) by mouth 3 (three) times daily as needed for muscle spasms.   furosemide (LASIX) 40 MG tablet Take 1 tablet (40 mg total) by mouth daily.   gabapentin (NEURONTIN) 300 MG capsule Take 1 capsule (300 mg total) by mouth 3 (three) times daily.   hydrOXYzine (ATARAX) 10 MG tablet Take 1 tablet (10 mg total) by mouth 3 (three) times daily as needed for anxiety.   oxyCODONE (ROXICODONE) 5 MG immediate release tablet Take 1 tablet (5 mg total) by mouth every 4 (four) hours as needed for severe pain.   pantoprazole (PROTONIX) 40 MG tablet Take 1 tablet (40 mg total) by mouth daily.   potassium chloride (KLOR-CON M) 10 MEQ tablet Take 2 tablets (20 mEq total) by mouth 2 (two) times daily.   prochlorperazine (COMPAZINE) 10 MG tablet Take 1 tablet (10 mg total) by mouth every 6 (six) hours as needed for nausea or vomiting.   traZODone (DESYREL) 50 MG tablet Take 1 tablet (50 mg total) by mouth at bedtime.    Past Medical History:  Diagnosis Date   Asthma    BMI 31.0-31.9,adult    Chlamydia    Gonorrhea    TOA (tubo-ovarian abscess)     Past Surgical History:  Procedure Laterality Date   INDUCED ABORTION     IR RADIOLOGIST EVAL & MGMT  05/17/2018   LAPAROTOMY N/A 02/16/2022   Procedure: EXPLORATORY LAPAROTOMY WITH ABDOMINAL WASHOUT;  Surgeon: Reva Bores, MD;  Location: [facility] OR;  Service: Gynecology;  Laterality: N/A;   SMALL BOWEL REPAIR N/A 02/16/2022   Procedure: SMALL BOWEL REPAIR;  Surgeon: Violeta Gelinas, MD;  Location: [facility] OR;  Service: General;  Laterality: N/A;   TONSILLECTOMY      Social:  - Currently lives in [place] with her mother - She reports quitting tobacco use approximately 5 to 6 months ago. - She reports quitting alcohol use approximately 9 to 10 months ago - She reports quitting marijuana use several months ago as well.  She denies any other illicit drug use  Family History   Problem Relation Age of Onset   Diabetes Mother     Hypertension Mother     Diabetes Father     Hypertension Father     Diabetes Sister     Hypertension Sister      Allergies: Allergies as of 03/13/2022 - Review Complete 03/13/2022  Allergen Reaction Noted   Fish allergy Anaphylaxis 03/15/2011   Motrin [ibuprofen] Anaphylaxis 02/04/2022   Mushroom extract complex Anaphylaxis 03/06/2018   Shellfish-derived products Anaphylaxis 08/21/2018   Tylenol [acetaminophen] Anaphylaxis 04/01/2018    Review of Systems: A complete ROS was negative except as per HPI.   OBJECTIVE:   Physical Exam: Blood pressure 93/81, pulse 93, temperature 98.5 F (36.9 C), temperature source Oral, resp. rate 18, weight 66.7 kg, last menstrual period  10/26/2021, SpO2 96 %, unknown if currently breastfeeding.  Constitutional: ill-appearing female sitting in hospital bed, drowsy, in no acute distress HENT: normocephalic atraumatic, mucous membranes moist Eyes: conjunctiva non-erythematous Neck: supple Cardiovascular: regular rate and rhythm, no m/r/g Pulmonary/Chest: normal work of breathing on room air, lungs clear to auscultation bilaterally Abdominal: Mild distension. 2 JP drains in place: one with serous fluid and the other with mildly serosanguinous fluid. Tenderness to palpation at epigastrium, periumbilcal, and suprapubic regions most pronounced at site of JP drains. No rigidity. Unable to perform deep palpation due to pain. Neurological: alert & oriented x 3, no focal deficits appreciated. Skin: warm and dry Psych: Normal mood and affect.  Labs: CBC    Component Value Date/Time   WBC 11.7 (H) 03/13/2022 0605   RBC 3.67 (L) 03/13/2022 0605   HGB 10.5 (L) 03/13/2022 0605   HGB 11.2 04/13/2018 1512   HCT 32.5 (L) 03/13/2022 0605   HCT 35.0 04/13/2018 1512   PLT 438 (H) 03/13/2022 0605   PLT 437 04/13/2018 1512   MCV 88.6 03/13/2022 0605   MCV 92 04/13/2018 1512   MCH 28.6 03/13/2022 0605    MCHC 32.3 03/13/2022 0605   RDW 17.7 (H) 03/13/2022 0605   RDW 15.8 (H) 04/13/2018 1512   LYMPHSABS 3.6 03/13/2022 0605   LYMPHSABS 4.7 (H) 04/13/2018 1512   MONOABS 0.8 03/13/2022 0605   EOSABS 0.0 03/13/2022 0605   EOSABS 0.3 04/13/2018 1512   BASOSABS 0.0 03/13/2022 0605   BASOSABS 0.0 04/13/2018 1512     CMP     Component Value Date/Time   NA 134 (L) 03/13/2022 0605   K 3.1 (L) 03/13/2022 0605   CL 102 03/13/2022 0605   CO2 22 03/13/2022 0605   GLUCOSE 111 (H) 03/13/2022 0605   BUN <5 (L) 03/13/2022 0605   CREATININE 0.63 03/13/2022 0605   CALCIUM 8.4 (L) 03/13/2022 0605   PROT 9.4 (H) 03/13/2022 0605   ALBUMIN 2.9 (L) 03/13/2022 0605   AST 26 03/13/2022 0605   ALT 23 03/13/2022 0605   ALKPHOS 61 03/13/2022 0605   BILITOT 0.5 03/13/2022 0605   GFRNONAA >60 03/13/2022 0605   GFRAA >60 01/15/2019 1308    Imaging: CT ABDOMEN PELVIS W CONTRAST  Result Date: 03/13/2022 CLINICAL DATA:  Postop abdominal pain. Status post laparotomy and small-bowel repair on 02/16/2022. EXAM: CT ABDOMEN AND PELVIS WITH CONTRAST TECHNIQUE: Multidetector CT imaging of the abdomen and pelvis was performed using the standard protocol following bolus administration of intravenous contrast. RADIATION DOSE REDUCTION: This exam was performed according to the departmental dose-optimization program which includes automated exposure control, adjustment of the mA and/or kV according to patient size and/or use of iterative reconstruction technique. CONTRAST:  100mL OMNIPAQUE IOHEXOL 300 MG/ML  SOLN COMPARISON:  03/02/2022. FINDINGS: Lower chest: Persistent bilateral pleural effusions, right greater than left. These appear mildly decreased in volume from previous exam. Overlying atelectasis is again noted. Hepatobiliary: There is no suspicious liver abnormality. Gallbladder is unremarkable. No significant bile duct dilatation. Pancreas: Unremarkable. No pancreatic ductal dilatation or surrounding inflammatory  changes. Spleen: Normal in size without focal abnormality. Adrenals/Urinary Tract: Normal adrenal glands. The kidneys are unremarkable. No nephrolithiasis, hydronephrosis or mass. Urinary bladder appears unremarkable. Stomach/Bowel: Stomach appears within normal limits. The appendix is not confidently identified separate from right lower quadrant bowel loops and inflammatory change. No pathologic dilatation of the large or small bowel loops identified to suggest bowel obstruction. Progressive inflammatory fat stranding surrounds nondilated loops of small bowel which  are difficult to categorize due to lack of IV contrast material. Suspect new/progressive secondary inflammation of the small bowel. Vascular/Lymphatic: Normal caliber abdominal aorta. Prominent retroperitoneal and bilateral pelvic lymph nodes are likely reactive. Reproductive: The uterus is unremarkable. Stable appearance of the uterus. Ovaries are difficult to differentiate from the extensive inflammatory changes within the pelvis. Other: A percutaneous drainage catheter enters the right ventral abdominal wall coursing along the undersurface of the ventral abdominal wall and terminating along the midline undersurface of the supraumbilical ventral abdominal wall, image 39/3. Compared with the previous exam there is progressive inflammatory changes along the intra-course of the drainage catheter with marked increased soft tissue stranding. No definite fluid collection identified within this area however. A second percutaneous drainage catheter enters the right lower quadrant ventral abdominal wall with tip terminating just above the midline dome of bladder, image 70/3. There has been interval increase in diffuse inflammatory changes along the course of this catheter. The previously noted peripherally enhancing fluid collection overlying the inferior right hepatic lobe measures 2.7 x 5.2 cm, image 40/3. This is compared with 4.6 x 1.1 cm previously.  Left-sided peripherally enhancing fluid collection within the pelvis measures 4.2 x 2.7 by 3.3 cm (volume = 20 cm^3), image 69/6. On the previous exam this measured 3.7 x 3.2 by 2.9 cm (volume = 18 cm^3). Smaller fluid collection within the right posterior pelvis is less conspicuous on today's study measuring 1.2 x 0.8 cm, image 75/3. Previously 2.4 x 1.9 cm. Persistent and progressive diffuse mesenteric and peritoneal inflammation/edema. Which now extends into the upper abdomen with progressive involvement of the nondilated small bowel loops. Musculoskeletal: No acute or significant osseous findings. IMPRESSION: 1. Persistent and progressive diffuse mesenteric and peritoneal inflammation/. There is progressive extension into the upper abdomen with increased involvement of nondilated small bowel loops. Increased inflammation with inflammatory fat stranding/infiltration along the intra-abdominal course of both percutaneous drainage catheters. 2. Stable to mild increase in size of left-sided peripherally enhancing pelvic fluid collection within the pelvis. Right posterior pelvic fluid collection and the fluid collection overlying the inferior right hepatic lobe have decreased in volume from previous exam. 3. Mild decrease in volume of bilateral pleural effusions with overlying atelectasis. Electronically Signed   By: Signa Kell M.D.   On: 03/13/2022 10:23   DG Chest Port 1 View  Result Date: 03/13/2022 CLINICAL DATA:  Questionable sepsis EXAM: PORTABLE CHEST 1 VIEW COMPARISON:  03/04/2022 FINDINGS: Asymmetric retrocardiac density also seen on prior. Lung volumes are low. Borderline heart size. No visible effusion or pneumothorax. Artifact from EKG leads IMPRESSION: Low volume chest with retrocardiac opacity that has also been seen on recent priors and favors atelectasis or scar. Electronically Signed   By: Tiburcio Pea M.D.   On: 03/13/2022 06:24    EKG: personally reviewed my interpretation is sinus  tachycardia  ASSESSMENT & PLAN:   Assessment & Plan by Problem: Principal Problem:   Fever Active Problems:   Pelvic abscess    Protein-calorie malnutrition (HCC)   Abdominal pain   Paula Lucas is a 31 y.o. female living with a history of tubo-ovarian abscesses now s/p ex lap with washout and JP drain placement who presented to the ED via EMS with fever and abdominal pains  #Abdominal pain #Fever #Hx tubo-ovarian abscesses s/o ex lap with JP drain placement Patient presents with abdominal pain and fever to 102.3 in the setting of recent hospitalization for tubo-ovarian abscesses now s/p ex lap with washout and JP drain placement  with subsequent 2 weeks doxycycline, 3 weeks Rocephin and Flagyl, and transitioned to Augmentin p.o. outpatient. Patient does not appear septic given normal lactate and no signs of end-organ dysfunction. JP drains with some output but appears improved from last admission. Not concerned for acute abdomen at this time. GYN not recommending surgical intervention at this time. Given no acute changes on CTAP, fever and mild leukocytosis more likely due to COVID diagnosis. -GYN consulted, appreciate recommendations -WOC consult -Continue IV cefepime and flagyl for empiric coverage for 1-2 days. Will transition back to Augmentin thereafter. -Oxycodone 10 mg q4 prn + Dilaudid 1 mg IV q3 prn for breakthrough pain -ADAT  #Covid PCR positive Patient with positive Covid test. Denies cough, headache, chest pains, SOB, but does endorse some nasal congestion. Sating well on RA. Given no hypoxia and only mild congestion, will provide supportive care. -Supportive care -supplemental O2 if SpO2<90%.  Malnutrition Patient required Cortrak during last admission as she was unable to meet nutritional requirements. Appears to have been tolerating PO intake after discharge. Will start her on clear liquids for now and advance diet as tolerated. -ADAT  Hx of MRSA Bacteriuria   Noted during last admission. Completed Zyvox therapy for this.   Diet:  Clear liquids VTE:  Lovenox IVF: LR,Bolus Code: Full  Prior to Admission Living Arrangement: Home, living with mother Anticipated Discharge Location: Home Barriers to Discharge: medical management  Dispo: Admit patient to Observation with expected length of stay less than 2 midnights.  Signed: Adron Bene, MD Internal Medicine Resident PGY-1  03/13/2022, 1:05 PM

## 2022-03-13 NOTE — Progress Notes (Signed)
Pharmacy Antibiotic Note  Paula Lucas is a 31 y.o. female admitted on 03/13/2022 presenting with increased abdominal pain, distention and fevers, hx of abscesses.  Pharmacy has been consulted for cefepime dosing.  Plan: Cefepime 2g IV q 8h Monitor renal function, clinical progression and LOT  Weight: 66.7 kg (147 lb)  Temp (24hrs), Avg:99.3 F (37.4 C), Min:97.5 F (36.4 C), Max:102.3 F (39.1 C)  Recent Labs  Lab 03/13/22 0605  WBC 11.7*  CREATININE 0.63  LATICACIDVEN 0.7    Estimated Creatinine Clearance: 95.2 mL/min (by C-G formula based on SCr of 0.63 mg/dL).    Allergies  Allergen Reactions   Fish Allergy Anaphylaxis    Severe allergic reaction to all seafoods   Motrin [Ibuprofen] Anaphylaxis    Pt tolerated Ketorolac 04/02/18 Pt tolerated toradol during hosp admission in July 2023   Mushroom Extract Complex Anaphylaxis   Shellfish-Derived Products Anaphylaxis   Tylenol [Acetaminophen] Anaphylaxis    Daylene Posey, PharmD Clinical Pharmacist ED Pharmacist Phone # [phone] 03/13/2022 1:28 PM

## 2022-03-13 NOTE — ED Notes (Addendum)
Pt to CT, remains alert, NAD, calm, pain continues, pain med given. VSS. Verbalizes "unable to take tylenol d/t couldn't breathe after receiving in the past". BS fan has helped with comfort.

## 2022-03-14 DIAGNOSIS — Z886 Allergy status to analgesic agent status: Secondary | ICD-10-CM | POA: Diagnosis not present

## 2022-03-14 DIAGNOSIS — Z91013 Allergy to seafood: Secondary | ICD-10-CM | POA: Diagnosis not present

## 2022-03-14 DIAGNOSIS — E876 Hypokalemia: Secondary | ICD-10-CM | POA: Diagnosis present

## 2022-03-14 DIAGNOSIS — K651 Peritoneal abscess: Secondary | ICD-10-CM | POA: Diagnosis present

## 2022-03-14 DIAGNOSIS — Z6826 Body mass index (BMI) 26.0-26.9, adult: Secondary | ICD-10-CM | POA: Diagnosis not present

## 2022-03-14 DIAGNOSIS — R509 Fever, unspecified: Secondary | ICD-10-CM | POA: Diagnosis present

## 2022-03-14 DIAGNOSIS — E440 Moderate protein-calorie malnutrition: Secondary | ICD-10-CM | POA: Diagnosis present

## 2022-03-14 DIAGNOSIS — Z79899 Other long term (current) drug therapy: Secondary | ICD-10-CM | POA: Diagnosis not present

## 2022-03-14 DIAGNOSIS — J45909 Unspecified asthma, uncomplicated: Secondary | ICD-10-CM | POA: Diagnosis present

## 2022-03-14 DIAGNOSIS — Z9102 Food additives allergy status: Secondary | ICD-10-CM | POA: Diagnosis not present

## 2022-03-14 DIAGNOSIS — N739 Female pelvic inflammatory disease, unspecified: Secondary | ICD-10-CM | POA: Diagnosis not present

## 2022-03-14 DIAGNOSIS — U071 COVID-19: Secondary | ICD-10-CM | POA: Diagnosis present

## 2022-03-14 DIAGNOSIS — Z87891 Personal history of nicotine dependence: Secondary | ICD-10-CM | POA: Diagnosis not present

## 2022-03-14 LAB — CBC WITH DIFFERENTIAL/PLATELET
Abs Immature Granulocytes: 0.02 10*3/uL (ref 0.00–0.07)
Basophils Absolute: 0 10*3/uL (ref 0.0–0.1)
Basophils Relative: 0 %
Eosinophils Absolute: 0.4 10*3/uL (ref 0.0–0.5)
Eosinophils Relative: 4 %
HCT: 28.1 % — ABNORMAL LOW (ref 36.0–46.0)
Hemoglobin: 8.9 g/dL — ABNORMAL LOW (ref 12.0–15.0)
Immature Granulocytes: 0 %
Lymphocytes Relative: 28 %
Lymphs Abs: 2.4 10*3/uL (ref 0.7–4.0)
MCH: 28.8 pg (ref 26.0–34.0)
MCHC: 31.7 g/dL (ref 30.0–36.0)
MCV: 90.9 fL (ref 80.0–100.0)
Monocytes Absolute: 0.5 10*3/uL (ref 0.1–1.0)
Monocytes Relative: 6 %
Neutro Abs: 5.3 10*3/uL (ref 1.7–7.7)
Neutrophils Relative %: 62 %
Platelets: 352 10*3/uL (ref 150–400)
RBC: 3.09 MIL/uL — ABNORMAL LOW (ref 3.87–5.11)
RDW: 17.5 % — ABNORMAL HIGH (ref 11.5–15.5)
WBC: 8.6 10*3/uL (ref 4.0–10.5)
nRBC: 0 % (ref 0.0–0.2)

## 2022-03-14 LAB — BASIC METABOLIC PANEL
Anion gap: 8 (ref 5–15)
BUN: <5 mg/dL — ABNORMAL LOW (ref 6–20)
CO2: 23 mmol/L (ref 22–32)
Calcium: 8.3 mg/dL — ABNORMAL LOW (ref 8.9–10.3)
Chloride: 106 mmol/L (ref 98–111)
Creatinine, Ser: 0.72 mg/dL (ref 0.44–1.00)
GFR, Estimated: >60 mL/min (ref >60)
Glucose, Bld: 93 mg/dL (ref 70–99)
Potassium: 2.8 mmol/L — ABNORMAL LOW (ref 3.5–5.1)
Sodium: 137 mmol/L (ref 135–145)

## 2022-03-14 LAB — PROTIME-INR
INR: 1.3 — ABNORMAL HIGH (ref 0.8–1.2)
Prothrombin Time: 15.9 seconds — ABNORMAL HIGH (ref 11.4–15.2)

## 2022-03-14 LAB — APTT: aPTT: 33 seconds (ref 24–36)

## 2022-03-14 LAB — GLUCOSE, CAPILLARY
Glucose-Capillary: 72 mg/dL (ref 70–99)
Glucose-Capillary: 96 mg/dL (ref 70–99)

## 2022-03-14 LAB — MAGNESIUM: Magnesium: 1.7 mg/dL (ref 1.7–2.4)

## 2022-03-14 MED ORDER — POTASSIUM CHLORIDE 10 MEQ/100ML IV SOLN
10.0000 meq | INTRAVENOUS | Status: AC
  Administered 2022-03-14 (×4): 10 meq via INTRAVENOUS
  Filled 2022-03-14 (×4): qty 100

## 2022-03-14 MED ORDER — LACTATED RINGERS IV SOLN: INTRAVENOUS | Status: DC

## 2022-03-14 MED ORDER — DEXTROSE IN LACTATED RINGERS 5 % IV SOLN
INTRAVENOUS | Status: DC
Start: 2022-03-14 — End: 2022-03-16

## 2022-03-14 MED ORDER — LACTATED RINGERS IV BOLUS
1000.0000 mL | Freq: Once | INTRAVENOUS | Status: AC
  Administered 2022-03-14: 1000 mL via INTRAVENOUS

## 2022-03-14 MED ORDER — KETOROLAC TROMETHAMINE 15 MG/ML IJ SOLN
15.0000 mg | Freq: Three times a day (TID) | INTRAMUSCULAR | Status: DC
  Administered 2022-03-14 – 2022-03-16 (×8): 15 mg via INTRAVENOUS
  Filled 2022-03-14 (×8): qty 1

## 2022-03-14 MED ORDER — INSULIN ASPART 100 UNIT/ML IJ SOLN: 0.0000 [IU] | Freq: Three times a day (TID) | INTRAMUSCULAR | Status: DC

## 2022-03-14 MED ORDER — KETOROLAC TROMETHAMINE 15 MG/ML IJ SOLN
15.0000 mg | Freq: Four times a day (QID) | INTRAMUSCULAR | Status: DC
Start: 2022-03-14 — End: 2022-03-14

## 2022-03-14 NOTE — Consult Note (Signed)
WOC Nurse Consult Note: Reason for Consult:NPWT dressings. Wound type:Surgical Pressure Injury POA: N/A Measurement:Not measured today Wound bed:red, moist Drainage (amount, consistency, odor) serous Periwound:intact Dressing procedure/placement/frequency: Patient known to our team. NPWT discontinued on 7/14 in favor of NS dressings twice daily. These will continue per Dr. Mont Dutton note yesterday; wound is healing well by secondary intention. Nursing to perform twice daily while in house.  I have communicated the POC with Dr Reita Cliche via Secure Chat.  WOC nursing team will not follow, but will remain available to this patient, the nursing and medical teams.  Please re-consult if needed.  Thank you for inviting [place] to participate in this patient's Plan of Care.  Ladona Mow, MSN, RN, CNS, GNP, Leda Min, [employer], [employer] phone:  [phone]

## 2022-03-14 NOTE — ED Notes (Signed)
Wound consult initiated. Pending note/ recommendations.

## 2022-03-14 NOTE — ED Notes (Signed)
Report received by previous RN. Pt continues ST 110-120s on the monitor. C/o ongoing pain. Discussed pain management with pt. Pt on purwick.

## 2022-03-14 NOTE — H&P (Signed)
Subjective:   Summary: Paula Lucas is a 31 y.o. female living with a history of tubo-ovarian abscesses now s/p ex lap with washout and JP drain placement who presented to the ED via EMS with fever and abdominal pains.  Overnight Events: NAEO  Today on rounds, patient states she feels much better. Her abdominal pain has improved. She states she was unable to eat her pizza last night due to decreased appetite. She has not had a BM today. She denies any upper respiratory symptoms. She feels dehydrated as she has only had some ice chips.  Objective:  Vital signs in last 24 hours: Vitals:   03/14/22 0400 03/14/22 0600 03/14/22 0800 03/14/22 1000  BP: 110/70 115/79 110/74 111/74  Pulse: (!) 110 (!) 105 (!) 104 95  Resp: 19 17 18 13   Temp:  99.2 F (37.3 C)  99.2 F (37.3 C)  TempSrc:  Oral  Oral  SpO2: 99% 98% 98% 98%  Weight:       Supplemental O2: Bag Valve Mask SpO2: 98 % O2 Flow Rate (L/min): 2 L/min FiO2 (%): 100 %   Physical Exam:  Constitutional: well-appearing female sitting in hospital bed, in no acute distress HENT: normocephalic atraumatic, mucous membranes moist Eyes: conjunctiva non-erythematous Neck: supple Cardiovascular: tachycardic with regular rhythm, no m/r/g Pulmonary/Chest: normal work of breathing on room air Abdominal: Distended with tenderness around JP drain insertion site. Bulbs - one with cloudy fluid, the other clear to slightly serosanguinous.  Neurological: alert & oriented x 3, no focal deficit Skin: warm and dry Psych: Normal mood and affect  Filed Weights   03/13/22 0556  Weight: 66.7 kg     Intake/Output Summary (Last 24 hours) at 03/14/2022 1140 Last data filed at 03/14/2022 1114 Gross per 24 hour  Intake 2600 ml  Output 1300 ml  Net 1300 ml   Net IO Since Admission: 3,750 mL [03/14/22 1140]  Pertinent Labs:    Latest Ref Rng & Units 03/13/2022    6:05 AM 03/05/2022    5:33 AM 03/02/2022    2:35 PM  CBC  WBC 4.0 - 10.5  K/uL 11.7  7.4  8.1   Hemoglobin 12.0 - 15.0 g/dL 05/03/2022  03.4  74.2   Hematocrit 36.0 - 46.0 % 32.5  32.1  32.8   Platelets 150 - 400 K/uL 438  605  607        Latest Ref Rng & Units 03/14/2022    3:28 AM 03/13/2022    6:05 AM 03/05/2022    5:33 AM  CMP  Glucose 70 - 99 mg/dL 93  03/07/2022  90   BUN 6 - 20 mg/dL <5  <5  12   Creatinine 0.44 - 1.00 mg/dL 638  7.56  4.33   Sodium 135 - 145 mmol/L 137  134  136   Potassium 3.5 - 5.1 mmol/L 2.8  3.1  3.1   Chloride 98 - 111 mmol/L 106  102  102   CO2 22 - 32 mmol/L 23  22  24    Calcium 8.9 - 10.3 mg/dL 8.3  8.4  9.2   Total Protein 6.5 - 8.1 g/dL  9.4  8.5   Total Bilirubin 0.3 - 1.2 mg/dL  0.5  0.8   Alkaline Phos 38 - 126 U/L  61  79   AST 15 - 41 U/L  26  20   ALT 0 - 44 U/L  23  33     Imaging: No  results found.  Assessment/Plan:   Principal Problem:   Pelvic abscess  Active Problems:   Protein-calorie malnutrition (HCC)   COVID-19   Patient Summary: Paula Lucas is a 31 y.o. female living with a history of tubo-ovarian abscesses now s/p ex lap with washout and JP drain placement who presented to the ED via EMS with fever and abdominal pains found to be COVID-positive.   #Abdominal pain #Fever #Hx tubo-ovarian abscesses s/o ex lap with JP drain placement Patient's abdominal pain much better today with IV cefepime and Flagyl and prn pain medication.  Drains with good output.  Suspect initial fever results of COVID.  Today's CBC still pending.  She has remained afebrile since initial presentation.  If remains stable through tomorrow, likely to go home with p.o. Flagyl and cefadroxil per discussion with ID.  Dehydrated today, will give 1 L LR bolus with maintenance.  GYN not recommending surgical intervention at this time. -GYN consulted, appreciate recommendations -Continue IV cefepime and flagyl for empiric coverage for through tomorrow.  Will transition to p.o. Flagyl and cefadroxil upon discharge. -Oxycodone 10 mg q4 prn  + Dilaudid 1 mg IV q3 prn for breakthrough pain -ADAT   #Covid PCR positive Afebrile. Asymptomatic and satting well on 2 L.  Patient with 2 L [place] when we saw her earlier today but she states this had just been placed to help her sleep.  Will attempt to wean today. -Supportive care -supplemental O2 if SpO2<90%.   #Hypokalemia K+ 2.8 today.  -replete with IV potassium chloride -Mag pending -f/u BMP tomorrow   Diet: Regular diet VTE:  Lovenox IVF: LR,Bolus Code: Full   Prior to Admission Living Arrangement: Home, living with mother Anticipated Discharge Location: Home Barriers to Discharge: medical management   Dispo: Admit patient to Observation with expected length of stay less than 2 midnights.  Adron Bene MD Internal Medicine Resident PGY-1 Please contact the on call pager after 5 pm and on weekends at [phone].

## 2022-03-15 ENCOUNTER — Encounter (HOSPITAL_COMMUNITY): Payer: Self-pay | Admitting: Student in an Organized Health Care Education/Training Program

## 2022-03-15 DIAGNOSIS — N739 Female pelvic inflammatory disease, unspecified: Secondary | ICD-10-CM | POA: Diagnosis not present

## 2022-03-15 LAB — COMPREHENSIVE METABOLIC PANEL
ALT: 19 U/L (ref 0–44)
AST: 21 U/L (ref 15–41)
Albumin: 2.1 g/dL — ABNORMAL LOW (ref 3.5–5.0)
Alkaline Phosphatase: 51 U/L (ref 38–126)
Anion gap: 6 (ref 5–15)
BUN: <5 mg/dL — ABNORMAL LOW (ref 6–20)
CO2: 27 mmol/L (ref 22–32)
Calcium: 8.3 mg/dL — ABNORMAL LOW (ref 8.9–10.3)
Chloride: 108 mmol/L (ref 98–111)
Creatinine, Ser: 0.57 mg/dL (ref 0.44–1.00)
GFR, Estimated: >60 mL/min (ref >60)
Glucose, Bld: 107 mg/dL — ABNORMAL HIGH (ref 70–99)
Potassium: 2.9 mmol/L — ABNORMAL LOW (ref 3.5–5.1)
Sodium: 141 mmol/L (ref 135–145)
Total Bilirubin: 0.3 mg/dL (ref 0.3–1.2)
Total Protein: 6 g/dL — ABNORMAL LOW (ref 6.5–8.1)

## 2022-03-15 LAB — GLUCOSE, CAPILLARY
Glucose-Capillary: 102 mg/dL — ABNORMAL HIGH (ref 70–99)
Glucose-Capillary: 83 mg/dL (ref 70–99)
Glucose-Capillary: 85 mg/dL (ref 70–99)
Glucose-Capillary: 86 mg/dL (ref 70–99)

## 2022-03-15 LAB — CBC WITH DIFFERENTIAL/PLATELET
Abs Immature Granulocytes: 0.01 10*3/uL (ref 0.00–0.07)
Basophils Absolute: 0 10*3/uL (ref 0.0–0.1)
Basophils Relative: 0 %
Eosinophils Absolute: 0.6 10*3/uL — ABNORMAL HIGH (ref 0.0–0.5)
Eosinophils Relative: 9 %
HCT: 27.9 % — ABNORMAL LOW (ref 36.0–46.0)
Hemoglobin: 9.2 g/dL — ABNORMAL LOW (ref 12.0–15.0)
Immature Granulocytes: 0 %
Lymphocytes Relative: 31 %
Lymphs Abs: 2 10*3/uL (ref 0.7–4.0)
MCH: 29 pg (ref 26.0–34.0)
MCHC: 33 g/dL (ref 30.0–36.0)
MCV: 88 fL (ref 80.0–100.0)
Monocytes Absolute: 0.4 10*3/uL (ref 0.1–1.0)
Monocytes Relative: 7 %
Neutro Abs: 3.4 10*3/uL (ref 1.7–7.7)
Neutrophils Relative %: 53 %
Platelets: 341 10*3/uL (ref 150–400)
RBC: 3.17 MIL/uL — ABNORMAL LOW (ref 3.87–5.11)
RDW: 17.2 % — ABNORMAL HIGH (ref 11.5–15.5)
WBC: 6.4 10*3/uL (ref 4.0–10.5)
nRBC: 0 % (ref 0.0–0.2)

## 2022-03-15 LAB — URINE CULTURE

## 2022-03-15 MED ORDER — ADULT MULTIVITAMIN W/MINERALS CH
1.0000 | ORAL_TABLET | Freq: Every day | ORAL | Status: DC
  Administered 2022-03-16: 1 via ORAL
  Filled 2022-03-15: qty 1

## 2022-03-15 MED ORDER — VALACYCLOVIR HCL 500 MG PO TABS
1000.0000 mg | ORAL_TABLET | Freq: Two times a day (BID) | ORAL | Status: DC
  Administered 2022-03-15 – 2022-03-16 (×3): 1000 mg via ORAL
  Filled 2022-03-15 (×3): qty 2

## 2022-03-15 MED ORDER — SODIUM CHLORIDE 0.9 % IV SOLN: 6.2500 mg | Freq: Four times a day (QID) | INTRAVENOUS | Status: DC | PRN

## 2022-03-15 MED ORDER — POTASSIUM CHLORIDE CRYS ER 20 MEQ PO TBCR
40.0000 meq | EXTENDED_RELEASE_TABLET | Freq: Two times a day (BID) | ORAL | Status: DC
Start: 2022-03-15 — End: 2022-03-16
  Administered 2022-03-15: 40 meq via ORAL
  Filled 2022-03-15 (×2): qty 2

## 2022-03-15 MED ORDER — POTASSIUM CHLORIDE 10 MEQ/100ML IV SOLN
10.0000 meq | INTRAVENOUS | Status: DC
  Filled 2022-03-15 (×2): qty 100

## 2022-03-15 NOTE — Progress Notes (Signed)
Initial Nutrition Assessment  DOCUMENTATION CODES:   Not applicable  INTERVENTION:  Encourage adequate PO intake Recommend pt's family bring foods she enjoys MVI with minerals daily  NUTRITION DIAGNOSIS:   Inadequate oral intake related to nausea, decreased appetite as evidenced by per patient/family report.  GOAL:   Patient will meet greater than or equal to 90% of their needs  MONITOR:   PO intake, Labs, Weight trends  REASON FOR ASSESSMENT:   Consult Assessment of nutrition requirement/status, Poor PO, Wound healing  ASSESSMENT:   Pt admitted with abdominal pain d/t pelvic abscess and found to be COVID+. Recently admitted for septic shock d/t tubo-ovarian abscesses and multiorgan failure now s/p exlap with washout and JP drain placement. Pt was discharged to CIR with wound VAC and JP drain and discharged home on 7/14.  GYN consulted. No plans for surgical intervention at this time.   Pt reports prior to admission, her intake was much improved and she was eating multiple meals per day. Her wt was beginning to increase as she had lost significant wt during her prior admission. She does not enjoy the hospital food nor nutrition supplements (boost, boost breeze and ensure). Previously had Cortrak during prior admission d/t poor PO intake.   Today she states that she feels nauseous and does not want anything to eat or drink except water. She c/o mouth/lip swelling as well as jaw soreness. Relayed this information to her RN.   Encouraged adequate PO intake and family/friends to bring snacks and meals that pt enjoys.   Reviewed wt history. It appears she has had a 13.3% wt loss between 6/17 and 7/21 which is clinically significant for time frame. There is limited documentation of wt history within the past year prior to 6/17.   Medications:SSI 0-6 units TID (not given), klor con IV drips: abx, D5 in LR @100ml /hr  Labs: potassium 2.9, BUN <5, CBG's 83-102 x24 hours  UOP:  x12 hours RLQ JP drain: 64ml x12 hours R abd JP drain: 56ml x12 hours I/O's: +5254ml since admission  NUTRITION - FOCUSED PHYSICAL EXAM:  Flowsheet Row Most Recent Value  Orbital Region No depletion  Upper Arm Region No depletion  Thoracic and Lumbar Region No depletion  Buccal Region No depletion  Temple Region No depletion  Clavicle Bone Region No depletion  Clavicle and Acromion Bone Region No depletion  Scapular Bone Region No depletion  Dorsal Hand No depletion  Patellar Region No depletion  Anterior Thigh Region No depletion  Posterior Calf Region No depletion  Edema (RD Assessment) None  Hair Reviewed  Eyes Reviewed  Mouth Other (Comment)  [swollen lips]  Skin Reviewed  Nails Reviewed       Diet Order:   Diet Order             Diet regular Room service appropriate? Yes; Fluid consistency: Thin  Diet effective now                   EDUCATION NEEDS:   Education needs have been addressed  Skin:  Skin Integrity Issues:: Incisions Incisions: abdomen (open)  Last BM:  7/22  Height:   Ht Readings from Last 1 Encounters:  03/14/22 5\' 3"  (1.6 m)    Weight:   Wt Readings from Last 1 Encounters:  03/14/22 66.7 kg   BMI:  Body mass index is 26.05 kg/m.  Estimated Nutritional Needs:   Kcal:  1900-2100  Protein:  95-110g  Fluid:  >/=1.9L   ,  RDN, LDN Clinical Nutrition

## 2022-03-15 NOTE — Progress Notes (Signed)
Subjective:   Summary: Paula Lucas is a 31 y.o. female living with a history of tubo-ovarian abscesses now s/p ex lap with washout and JP drain placement who presented to the ED via EMS with fever and abdominal pains.  Overnight Events: NAEO  Patient continues to endorse abdominal pains. She denies any BMs since getting here but does endorse flatus. She reports lack of appetite and not eating since being here. She has been feeling nauseous and was vomiting yesterday. She also reports swelling at her lips which is new for her.  Objective:  Vital signs in last 24 hours: Vitals:   03/15/22 0343 03/15/22 0737 03/15/22 0739 03/15/22 1200  BP: (!) 124/92 114/83  109/79  Pulse: 92 92  92  Resp: 16  20 16   Temp: 98.8 F (37.1 C) 97.6 F (36.4 C)  97.8 F (36.6 C)  TempSrc: Oral Oral  Oral  SpO2: 99%     Weight:      Height:       Supplemental O2: Bag Valve Mask SpO2: 99 % O2 Flow Rate (L/min): 2 L/min FiO2 (%): 100 %   Physical Exam:  Constitutional: well-appearing female sitting in hospital bed, in no acute distress HENT: normocephalic atraumatic, mucous membranes moist Eyes: conjunctiva non-erythematous Neck: supple Cardiovascular: tachycardic with regular rhythm, no m/r/g Pulmonary/Chest: normal work of breathing on room air Abdominal: Distended with tenderness around JP drain insertion site. Bulbs - one with cloudy fluid, the other clear to slightly serosanguinous.  Neurological: alert & oriented x 3, no focal deficit Skin: warm and dry Psych: Normal mood and affect  Filed Weights   03/13/22 0556 03/14/22 2128  Weight: 66.7 kg 66.7 kg     Intake/Output Summary (Last 24 hours) at 03/15/2022 1504 Last data filed at 03/15/2022 0345 Gross per 24 hour  Intake 2119.19 ml  Output 1250 ml  Net 869.19 ml   Net IO Since Admission: 5,319.19 mL [03/15/22 1504]  Pertinent Labs:    Latest Ref Rng & Units 03/15/2022    7:22 AM 03/14/2022    3:19 PM 03/13/2022     6:05 AM  CBC  WBC 4.0 - 10.5 K/uL 6.4  8.6  11.7   Hemoglobin 12.0 - 15.0 g/dL 9.2  8.9  03/15/2022   Hematocrit 36.0 - 46.0 % 27.9  28.1  32.5   Platelets 150 - 400 K/uL 341  352  438        Latest Ref Rng & Units 03/15/2022    7:22 AM 03/14/2022    3:28 AM 03/13/2022    6:05 AM  CMP  Glucose 70 - 99 mg/dL 03/15/2022  93  277   BUN 6 - 20 mg/dL <5  <5  <5   Creatinine 0.44 - 1.00 mg/dL 824  2.35  3.61   Sodium 135 - 145 mmol/L 141  137  134   Potassium 3.5 - 5.1 mmol/L 2.9  2.8  3.1   Chloride 98 - 111 mmol/L 108  106  102   CO2 22 - 32 mmol/L 27  23  22    Calcium 8.9 - 10.3 mg/dL 8.3  8.3  8.4   Total Protein 6.5 - 8.1 g/dL 6.0   9.4   Total Bilirubin 0.3 - 1.2 mg/dL 0.3   0.5   Alkaline Phos 38 - 126 U/L 51   61   AST 15 - 41 U/L 21   26   ALT 0 - 44 U/L 19  23     Imaging: No results found.  Assessment/Plan:   Principal Problem:   Pelvic abscess  Active Problems:   Protein-calorie malnutrition (HCC)   COVID-19   Fever   Patient Summary: Paula Lucas is a 31 y.o. female living with a history of tubo-ovarian abscesses now s/p ex lap with washout and JP drain placement who presented to the ED via EMS with fever and abdominal pains found to be COVID-positive.   #Abdominal pain #Fever #Hx tubo-ovarian abscesses s/o ex lap with JP drain placement Continues to endorse abdominal pain today though moderately improved from yesterday  Drains with good output and similar appearance.  Suspect initial fever results of COVID.  CBC without leukocytosis.  She has remained afebrile since initial presentation. Likely to go home with p.o. Flagyl and cefadroxil per discussion with ID.  GYN not recommending surgical intervention at this time. Patient is not eating. Will keep at least through tomorrow with hopes of improved PO intake.   -GYN consulted, appreciate recommendations -Continue IV cefepime and flagyl for empiric coverage for through tomorrow.  Will transition to p.o. Flagyl and  cefadroxil upon discharge. -Oxycodone 10 mg q4 prn + Dilaudid 1 mg IV q3 prn for breakthrough pain -Phenergan prn -ADAT   #Covid PCR positive Afebrile. Asymptomatic and satting well on RA.  -Supportive care -supplemental O2 if SpO2<90%.   #Hypokalemia K+ 2.9 today.  -replete with IV potassium chloride -Mag wnl -f/u BMP tomorrow   Diet: Regular diet VTE:  Lovenox IVF: LR,Bolus Code: Full   Prior to Admission Living Arrangement: Home, living with mother Anticipated Discharge Location: Home Barriers to Discharge: medical management   Dispo: Admit patient to Observation with expected length of stay less than 2 midnights.  Adron Bene MD Internal Medicine Resident PGY-1 Please contact the on call pager after 5 pm and on weekends at [phone].

## 2022-03-15 NOTE — Progress Notes (Signed)
Patient recently discharged from CIR on 7/14. History of tubo-ovarian abscesses now s/p ex lap with washout and JP drain placement. Patient discharged to mother's house. Patient readmitted due to positive covid. TOC will continue to follow for needs.

## 2022-03-16 ENCOUNTER — Other Ambulatory Visit (HOSPITAL_COMMUNITY): Payer: Self-pay

## 2022-03-16 DIAGNOSIS — N739 Female pelvic inflammatory disease, unspecified: Secondary | ICD-10-CM | POA: Diagnosis not present

## 2022-03-16 LAB — BASIC METABOLIC PANEL
Anion gap: 10 (ref 5–15)
Anion gap: 14 (ref 5–15)
BUN: <5 mg/dL — ABNORMAL LOW (ref 6–20)
BUN: <5 mg/dL — ABNORMAL LOW (ref 6–20)
CO2: 24 mmol/L (ref 22–32)
CO2: 22 mmol/L (ref 22–32)
Calcium: 8.2 mg/dL — ABNORMAL LOW (ref 8.9–10.3)
Calcium: 8.4 mg/dL — ABNORMAL LOW (ref 8.9–10.3)
Chloride: 105 mmol/L (ref 98–111)
Chloride: 101 mmol/L (ref 98–111)
Creatinine, Ser: 0.53 mg/dL (ref 0.44–1.00)
Creatinine, Ser: 0.56 mg/dL (ref 0.44–1.00)
GFR, Estimated: >60 mL/min (ref >60)
GFR, Estimated: >60 mL/min (ref >60)
Glucose, Bld: 81 mg/dL (ref 70–99)
Glucose, Bld: 73 mg/dL (ref 70–99)
Potassium: 2.7 mmol/L — CL (ref 3.5–5.1)
Potassium: 3.1 mmol/L — ABNORMAL LOW (ref 3.5–5.1)
Sodium: 139 mmol/L (ref 135–145)
Sodium: 137 mmol/L (ref 135–145)

## 2022-03-16 LAB — CBC WITH DIFFERENTIAL/PLATELET
Abs Immature Granulocytes: 0.01 10*3/uL (ref 0.00–0.07)
Basophils Absolute: 0 10*3/uL (ref 0.0–0.1)
Basophils Relative: 1 %
Eosinophils Absolute: 0.5 10*3/uL (ref 0.0–0.5)
Eosinophils Relative: 9 %
HCT: 29.6 % — ABNORMAL LOW (ref 36.0–46.0)
Hemoglobin: 9.7 g/dL — ABNORMAL LOW (ref 12.0–15.0)
Immature Granulocytes: 0 %
Lymphocytes Relative: 39 %
Lymphs Abs: 2.2 10*3/uL (ref 0.7–4.0)
MCH: 28.7 pg (ref 26.0–34.0)
MCHC: 32.8 g/dL (ref 30.0–36.0)
MCV: 87.6 fL (ref 80.0–100.0)
Monocytes Absolute: 0.3 10*3/uL (ref 0.1–1.0)
Monocytes Relative: 6 %
Neutro Abs: 2.6 10*3/uL (ref 1.7–7.7)
Neutrophils Relative %: 45 %
Platelets: 373 10*3/uL (ref 150–400)
RBC: 3.38 MIL/uL — ABNORMAL LOW (ref 3.87–5.11)
RDW: 16.6 % — ABNORMAL HIGH (ref 11.5–15.5)
WBC: 5.7 10*3/uL (ref 4.0–10.5)
nRBC: 0 % (ref 0.0–0.2)

## 2022-03-16 LAB — GLUCOSE, CAPILLARY
Glucose-Capillary: 73 mg/dL (ref 70–99)
Glucose-Capillary: 67 mg/dL — ABNORMAL LOW (ref 70–99)
Glucose-Capillary: 72 mg/dL (ref 70–99)

## 2022-03-16 MED ORDER — POTASSIUM CHLORIDE CRYS ER 20 MEQ PO TBCR
40.0000 meq | EXTENDED_RELEASE_TABLET | Freq: Two times a day (BID) | ORAL | Status: DC
Start: 2022-03-16 — End: 2022-03-16
  Administered 2022-03-16: 40 meq via ORAL
  Filled 2022-03-16: qty 2

## 2022-03-16 MED ORDER — VALACYCLOVIR HCL 1 G PO TABS
1000.0000 mg | ORAL_TABLET | Freq: Two times a day (BID) | ORAL | 1 refills | Status: DC
  Filled 2022-03-16: qty 30, 15d supply, fill #0

## 2022-03-16 MED ORDER — MAGNESIUM SULFATE 2 GM/50ML IV SOLN
2.0000 g | Freq: Once | INTRAVENOUS | Status: AC
  Administered 2022-03-16: 2 g via INTRAVENOUS
  Filled 2022-03-16: qty 50

## 2022-03-16 MED ORDER — CEFADROXIL 500 MG PO CAPS
1000.0000 mg | ORAL_CAPSULE | Freq: Two times a day (BID) | ORAL | 0 refills | Status: DC
  Filled 2022-03-16: qty 60, 15d supply, fill #0

## 2022-03-16 MED ORDER — CEFADROXIL 500 MG PO CAPS
1000.0000 mg | ORAL_CAPSULE | Freq: Two times a day (BID) | ORAL | 0 refills | Status: DC
  Filled 2022-03-16: qty 120, 30d supply, fill #0

## 2022-03-16 MED ORDER — METRONIDAZOLE 500 MG PO TABS
500.0000 mg | ORAL_TABLET | Freq: Two times a day (BID) | ORAL | 0 refills | Status: DC
  Filled 2022-03-16: qty 60, 30d supply, fill #0

## 2022-03-16 MED ORDER — OXYCODONE HCL 5 MG PO TABS
5.0000 mg | ORAL_TABLET | ORAL | 0 refills | Status: DC | PRN
  Filled 2022-03-16: qty 30, 5d supply, fill #0

## 2022-03-16 MED ORDER — METRONIDAZOLE 500 MG PO TABS: 500.0000 mg | ORAL_TABLET | Freq: Two times a day (BID) | ORAL | Status: DC

## 2022-03-16 MED ORDER — POTASSIUM CHLORIDE 10 MEQ/100ML IV SOLN
10.0000 meq | INTRAVENOUS | Status: AC
  Administered 2022-03-16 (×6): 10 meq via INTRAVENOUS
  Filled 2022-03-16 (×5): qty 100

## 2022-03-16 MED ORDER — CEFADROXIL 500 MG PO CAPS
1000.0000 mg | ORAL_CAPSULE | Freq: Two times a day (BID) | ORAL | Status: DC
  Administered 2022-03-16: 1000 mg via ORAL
  Filled 2022-03-16 (×2): qty 2

## 2022-03-16 NOTE — Discharge Summary (Addendum)
Name: Paula Lucas MRN: [id number] DOB: 01/14/1991 31 y.o. PCP: Pcp, No  Date of Admission: 03/13/2022  5:56 AM Date of Discharge: 03/16/2022 Attending Physician: Reymundo Poll, MD  Discharge Diagnosis: 1. Principal Problem:   Pelvic abscess  Active Problems:   Protein-calorie malnutrition (HCC)   COVID-19   Fever   Discharge Medications: Allergies as of 03/16/2022       Reactions   Fish Allergy Anaphylaxis   Severe allergic reaction to all seafoods   Motrin [ibuprofen] Anaphylaxis   Pt tolerated Ketorolac 04/02/18 Pt tolerated toradol during hosp admission in July 2023   Mushroom Extract Complex Anaphylaxis   Shellfish-derived Products Anaphylaxis   Tylenol [acetaminophen] Anaphylaxis        Medication List     STOP taking these medications    amoxicillin-clavulanate 875-125 MG tablet Commonly known as: AUGMENTIN       TAKE these medications    cefadroxil 500 MG capsule Commonly known as: DURICEF Take 2 capsules (1,000 mg total) by mouth 2 (two) times daily.   cyclobenzaprine 5 MG tablet Commonly known as: FLEXERIL Take 1 tablet (5 mg total) by mouth 3 (three) times daily as needed for muscle spasms.   furosemide 40 MG tablet Commonly known as: LASIX Take 1 tablet (40 mg total) by mouth daily.   gabapentin 300 MG capsule Commonly known as: NEURONTIN Take 1 capsule (300 mg total) by mouth 3 (three) times daily.   hydrOXYzine 10 MG tablet Commonly known as: ATARAX Take 1 tablet (10 mg total) by mouth 3 (three) times daily as needed for anxiety.   metroNIDAZOLE 500 MG tablet Commonly known as: FLAGYL Take 1 tablet (500 mg total) by mouth 2 (two) times daily.   naloxone 4 MG/0.1ML Liqd nasal spray kit Commonly known as: NARCAN Use 1 spray in the nose as needed.   oxyCODONE 5 MG immediate release tablet Commonly known as: Roxicodone Take 1 tablet (5 mg total) by mouth every 4 (four) hours as needed for severe pain.   pantoprazole 40 MG  tablet Commonly known as: PROTONIX Take 1 tablet (40 mg total) by mouth daily.   potassium chloride 10 MEQ tablet Commonly known as: KLOR-CON M Take 2 tablets (20 mEq total) by mouth 2 (two) times daily.   prochlorperazine 10 MG tablet Commonly known as: COMPAZINE Take 1 tablet (10 mg total) by mouth every 6 (six) hours as needed for nausea or vomiting.   traZODone 50 MG tablet Commonly known as: DESYREL Take 1 tablet (50 mg total) by mouth at bedtime.   valACYclovir 1000 MG tablet Commonly known as: VALTREX Take 1 tablet (1,000 mg total) by mouth 2 (two) times daily.               Discharge Care Instructions  (From admission, onward)           Start     Ordered   03/16/22 0000  Discharge wound care:       Comments: Wound care to midline wound:  Cleanse with soap and water, rinse and pat dry. Fill/cover defect with NS dampened gauze (opened), top with dry gauze and secure with tape.  Change twice daily.   03/16/22 1418            Disposition and follow-up:   Ms.Malin S Casalino was discharged from [facility] in Stable condition.  At the hospital follow up visit please address:  1.  Pelvic Abscesses: Patient recently discharged with history of multiple abscesses  s/p ex lap with washout and drain placement. Her abdominal pain has improved with IV antibiotics and pain medication. Please follow up to ensure adequate pain control, no new signs of infection/worsening of abdominal inflammation, and good PO intake. Will go home on previous home oxy 5 q4 hr.   Fever: Patient presented with fever and was found to be Covid positive with mild leukocytosis. She has remained largely asymptomatic with no need for O2. Instructed to quarantine for 2 more days. F/u with CBC and to ensure resolutions of any symptoms.   Hypokalemia: patient was hypokalemic at presentation. Replenished to 3.1 at discharge. Will send home with 2 days of KCL 40 meq. F/u with BMP.    2.  Labs / imaging needed at time of follow-up: CBC, BMP  3.  Pending labs/ test needing follow-up: none  Follow-up Appointments:  Follow-up Information     Griffin Basil, MD. Go to.   Specialty: Obstetrics and Gynecology Contact information: [place] [address] [phone]         Ladell Pier, MD. Go to.   Specialty: Internal Medicine Contact information: [address] [place] [address] [phone]         Laurice Record, MD. Go to.   Specialty: Infectious Diseases Contact information: [address] [place] [address] [phone]                 Hospital Course by problem list: 1. Patient with recent complicated admission for intra-abdominal abscesses now s/p ex lap with wash out and JP drain placement. Patient had been on Augmentin outpatient, possibly with intermittent febrile episodes. Readmitted for abdominal pain and fever. Initial fever to 102.3. Mild leukocytosis to 11.7. Placed on IV cefepime and flagyl. CTAP 07/22 showed persistent and progressive diffuse mesenteric and peritoneal inflammation with stable to mild increase in size of left-sided fluid collection but decreased size of right posterior collection and collection overlying inferior right hepatic lobe. Drains appear well placed and continue with good drainage. GYN consulted but did not recommend intervention. Fever may have been related to inflammation but more likely Covid infection. No respiratory symptoms and oxygenating well throughout admission. Has remained afebrile since initial fever, with resolution of leukocytosis. Hemodynamically stable during admission with much improvement in abdominal pain. Transitioned to cefadroxil and flagyl po with follow up scheduled with ID on Aug 11. Patient did not tolerate hospital food but states she eats well at home. Dietician consulted and recommended patient have family or friends bring foods she  likes.  Discharge subjective: Patient's abdominal pain is much better today. She states she has not eaten because she does not like hospital food. She states she is eager to get home where she has food she enjoys.  Discharge Exam:   BP (!) 126/95 (BP Location: Right Arm)   Pulse 68   Temp 97.8 F (36.6 C) (Oral)   Resp 14   Ht $R'5\' 3"'Kd$  (1.6 m)   Wt 66.7 kg   LMP 10/26/2021   SpO2 95%   BMI 26.05 kg/m  Discharge exam: Physical Exam Constitutional:      General: She is not in acute distress. HENT:     Head: Normocephalic and atraumatic.  Eyes:     Extraocular Movements: Extraocular movements intact.  Cardiovascular:     Rate and Rhythm: Regular rhythm. Tachycardia present.  Pulmonary:     Effort: Pulmonary effort is normal.  Abdominal:     Comments:  Tender to palpation at JP drain insertion sites. One drain with cloudy fluid, other with serosanguinous.   Skin:    General: Skin is warm and dry.  Neurological:     General: No focal deficit present.     Mental Status: She is alert.  Psychiatric:        Mood and Affect: Mood normal.      Pertinent Labs, Studies, and Procedures:      Latest Ref Rng & Units 03/16/2022    3:30 AM 03/15/2022    7:22 AM 03/14/2022    3:19 PM  CBC  WBC 4.0 - 10.5 K/uL 5.7  6.4  8.6   Hemoglobin 12.0 - 15.0 g/dL 9.7  9.2  8.9   Hematocrit 36.0 - 46.0 % 29.6  27.9  28.1   Platelets 150 - 400 K/uL 373  341  352       Latest Ref Rng & Units 03/16/2022   12:36 PM 03/16/2022    3:30 AM 03/15/2022    7:22 AM  CMP  Glucose 70 - 99 mg/dL 73  81  107   BUN 6 - 20 mg/dL <5  <5  <5   Creatinine 0.44 - 1.00 mg/dL 0.56  0.53  0.57   Sodium 135 - 145 mmol/L 137  139  141   Potassium 3.5 - 5.1 mmol/L 3.1  2.7  2.9   Chloride 98 - 111 mmol/L 101  105  108   CO2 22 - 32 mmol/L $RemoveB'22  24  27   'raKwdOHl$ Calcium 8.9 - 10.3 mg/dL 8.4  8.2  8.3   Total Protein 6.5 - 8.1 g/dL   6.0   Total Bilirubin 0.3 - 1.2 mg/dL   0.3   Alkaline Phos 38 - 126 U/L   51   AST 15 - 41  U/L   21   ALT 0 - 44 U/L   19     CT ABDOMEN PELVIS W CONTRAST  Result Date: 03/13/2022 CLINICAL DATA:  Postop abdominal pain. Status post laparotomy and small-bowel repair on 02/16/2022. EXAM: CT ABDOMEN AND PELVIS WITH CONTRAST TECHNIQUE: Multidetector CT imaging of the abdomen and pelvis was performed using the standard protocol following bolus administration of intravenous contrast. RADIATION DOSE REDUCTION: This exam was performed according to the departmental dose-optimization program which includes automated exposure control, adjustment of the mA and/or kV according to patient size and/or use of iterative reconstruction technique. CONTRAST:  110mL OMNIPAQUE IOHEXOL 300 MG/ML  SOLN COMPARISON:  03/02/2022. FINDINGS: Lower chest: Persistent bilateral pleural effusions, right greater than left. These appear mildly decreased in volume from previous exam. Overlying atelectasis is again noted. Hepatobiliary: There is no suspicious liver abnormality. Gallbladder is unremarkable. No significant bile duct dilatation. Pancreas: Unremarkable. No pancreatic ductal dilatation or surrounding inflammatory changes. Spleen: Normal in size without focal abnormality. Adrenals/Urinary Tract: Normal adrenal glands. The kidneys are unremarkable. No nephrolithiasis, hydronephrosis or mass. Urinary bladder appears unremarkable. Stomach/Bowel: Stomach appears within normal limits. The appendix is not confidently identified separate from right lower quadrant bowel loops and inflammatory change. No pathologic dilatation of the large or small bowel loops identified to suggest bowel obstruction. Progressive inflammatory fat stranding surrounds nondilated loops of small bowel which are difficult to categorize due to lack of IV contrast material. Suspect new/progressive secondary inflammation of the small bowel. Vascular/Lymphatic: Normal caliber abdominal aorta. Prominent retroperitoneal and bilateral pelvic lymph nodes are likely  reactive. Reproductive: The uterus is unremarkable. Stable appearance of the uterus. Ovaries are difficult  to differentiate from the extensive inflammatory changes within the pelvis. Other: A percutaneous drainage catheter enters the right ventral abdominal wall coursing along the undersurface of the ventral abdominal wall and terminating along the midline undersurface of the supraumbilical ventral abdominal wall, image 39/3. Compared with the previous exam there is progressive inflammatory changes along the intra-course of the drainage catheter with marked increased soft tissue stranding. No definite fluid collection identified within this area however. A second percutaneous drainage catheter enters the right lower quadrant ventral abdominal wall with tip terminating just above the midline dome of bladder, image 70/3. There has been interval increase in diffuse inflammatory changes along the course of this catheter. The previously noted peripherally enhancing fluid collection overlying the inferior right hepatic lobe measures 2.7 x 5.2 cm, image 40/3. This is compared with 4.6 x 1.1 cm previously. Left-sided peripherally enhancing fluid collection within the pelvis measures 4.2 x 2.7 by 3.3 cm (volume = 20 cm^3), image 69/6. On the previous exam this measured 3.7 x 3.2 by 2.9 cm (volume = 18 cm^3). Smaller fluid collection within the right posterior pelvis is less conspicuous on today's study measuring 1.2 x 0.8 cm, image 75/3. Previously 2.4 x 1.9 cm. Persistent and progressive diffuse mesenteric and peritoneal inflammation/edema. Which now extends into the upper abdomen with progressive involvement of the nondilated small bowel loops. Musculoskeletal: No acute or significant osseous findings. IMPRESSION: 1. Persistent and progressive diffuse mesenteric and peritoneal inflammation/. There is progressive extension into the upper abdomen with increased involvement of nondilated small bowel loops. Increased  inflammation with inflammatory fat stranding/infiltration along the intra-abdominal course of both percutaneous drainage catheters. 2. Stable to mild increase in size of left-sided peripherally enhancing pelvic fluid collection within the pelvis. Right posterior pelvic fluid collection and the fluid collection overlying the inferior right hepatic lobe have decreased in volume from previous exam. 3. Mild decrease in volume of bilateral pleural effusions with overlying atelectasis. Electronically Signed   By: Kerby Moors M.D.   On: 03/13/2022 10:23   DG Chest Port 1 View  Result Date: 03/13/2022 CLINICAL DATA:  Questionable sepsis EXAM: PORTABLE CHEST 1 VIEW COMPARISON:  03/04/2022 FINDINGS: Asymmetric retrocardiac density also seen on prior. Lung volumes are low. Borderline heart size. No visible effusion or pneumothorax. Artifact from EKG leads IMPRESSION: Low volume chest with retrocardiac opacity that has also been seen on recent priors and favors atelectasis or scar. Electronically Signed   By: Jorje Guild M.D.   On: 03/13/2022 06:24     Discharge Instructions: Discharge Instructions     Discharge instructions   Complete by: As directed    Mrs. Hodgkins,  It was a pleasure caring for you during your admission here at [facility]. You came in with abdominal pain and were treated with IV antibiotics and pain medication. Your abdominal pain has improved but we will send you home with oral antibiotics and pain medication. Please use your pain medication sparingly. You were also found to be Covid positive. Please remain isolated for 2 more days to complete a total of 5 days in quarantine since your positive test. -Please take your cefadroxil twice daily -Please take your flagyl twice daily -Please follow up with OB/GYN on July 28 at 9:35am -Please follow up with infectious disease on August 11 at 3:30pm -Please follow up with your primary care provider on August 11 at 2:30pm   Discharge wound  care:   Complete by: As directed    Wound care to midline wound:  Cleanse with soap and water, rinse and pat dry. Fill/cover defect with NS dampened gauze (opened), top with dry gauze and secure with tape.  Change twice daily.   Increase activity slowly   Complete by: As directed        Signed: Linward Natal, MD 03/16/2022, 2:34 PM   Pager: [phone]

## 2022-03-18 ENCOUNTER — Emergency Department (HOSPITAL_COMMUNITY): Payer: Medicaid Other

## 2022-03-18 ENCOUNTER — Inpatient Hospital Stay (HOSPITAL_COMMUNITY)
Admission: EM | Admit: 2022-03-18 | Discharge: 2022-03-31 | DRG: 758 | Disposition: A | Discharge status: Home / Self Care | Payer: Medicaid Other | Attending: Internal Medicine | Admitting: Internal Medicine

## 2022-03-18 ENCOUNTER — Other Ambulatory Visit: Payer: Self-pay

## 2022-03-18 DIAGNOSIS — Z888 Allergy status to other drugs, medicaments and biological substances status: Secondary | ICD-10-CM

## 2022-03-18 DIAGNOSIS — Z8616 Personal history of COVID-19: Secondary | ICD-10-CM

## 2022-03-18 DIAGNOSIS — N739 Female pelvic inflammatory disease, unspecified: Secondary | ICD-10-CM | POA: Diagnosis not present

## 2022-03-18 DIAGNOSIS — R1012 Left upper quadrant pain: Secondary | ICD-10-CM

## 2022-03-18 DIAGNOSIS — Z6831 Body mass index (BMI) 31.0-31.9, adult: Secondary | ICD-10-CM

## 2022-03-18 DIAGNOSIS — Z833 Family history of diabetes mellitus: Secondary | ICD-10-CM

## 2022-03-18 DIAGNOSIS — L539 Erythematous condition, unspecified: Secondary | ICD-10-CM | POA: Diagnosis present

## 2022-03-18 DIAGNOSIS — W2203XA Walked into furniture, initial encounter: Secondary | ICD-10-CM | POA: Diagnosis not present

## 2022-03-18 DIAGNOSIS — Z87891 Personal history of nicotine dependence: Secondary | ICD-10-CM

## 2022-03-18 DIAGNOSIS — E876 Hypokalemia: Secondary | ICD-10-CM | POA: Diagnosis present

## 2022-03-18 DIAGNOSIS — Y92239 Unspecified place in hospital as the place of occurrence of the external cause: Secondary | ICD-10-CM | POA: Diagnosis present

## 2022-03-18 DIAGNOSIS — Z91013 Allergy to seafood: Secondary | ICD-10-CM

## 2022-03-18 DIAGNOSIS — G629 Polyneuropathy, unspecified: Secondary | ICD-10-CM | POA: Diagnosis present

## 2022-03-18 DIAGNOSIS — Z91018 Allergy to other foods: Secondary | ICD-10-CM

## 2022-03-18 DIAGNOSIS — F4310 Post-traumatic stress disorder, unspecified: Secondary | ICD-10-CM | POA: Diagnosis present

## 2022-03-18 DIAGNOSIS — K567 Ileus, unspecified: Secondary | ICD-10-CM | POA: Diagnosis present

## 2022-03-18 DIAGNOSIS — G4700 Insomnia, unspecified: Secondary | ICD-10-CM | POA: Diagnosis present

## 2022-03-18 DIAGNOSIS — R21 Rash and other nonspecific skin eruption: Secondary | ICD-10-CM | POA: Diagnosis present

## 2022-03-18 DIAGNOSIS — E46 Unspecified protein-calorie malnutrition: Secondary | ICD-10-CM | POA: Diagnosis present

## 2022-03-18 DIAGNOSIS — D6489 Other specified anemias: Secondary | ICD-10-CM | POA: Diagnosis present

## 2022-03-18 DIAGNOSIS — Z8249 Family history of ischemic heart disease and other diseases of the circulatory system: Secondary | ICD-10-CM

## 2022-03-18 DIAGNOSIS — F250 Schizoaffective disorder, bipolar type: Secondary | ICD-10-CM | POA: Diagnosis present

## 2022-03-18 DIAGNOSIS — D75839 Thrombocytosis, unspecified: Secondary | ICD-10-CM | POA: Diagnosis not present

## 2022-03-18 DIAGNOSIS — Z79899 Other long term (current) drug therapy: Secondary | ICD-10-CM

## 2022-03-18 LAB — I-STAT CHEM 8, ED
BUN: <3 mg/dL — ABNORMAL LOW (ref 6–20)
Calcium, Ion: 1.1 mmol/L — ABNORMAL LOW (ref 1.15–1.40)
Chloride: 102 mmol/L (ref 98–111)
Creatinine, Ser: 0.3 mg/dL — ABNORMAL LOW (ref 0.44–1.00)
Glucose, Bld: 82 mg/dL (ref 70–99)
HCT: 30 % — ABNORMAL LOW (ref 36.0–46.0)
Hemoglobin: 10.2 g/dL — ABNORMAL LOW (ref 12.0–15.0)
Potassium: 2.7 mmol/L — CL (ref 3.5–5.1)
Sodium: 141 mmol/L (ref 135–145)
TCO2: 22 mmol/L (ref 22–32)

## 2022-03-18 LAB — CBC WITH DIFFERENTIAL/PLATELET
Abs Immature Granulocytes: 0 10*3/uL (ref 0.00–0.07)
Basophils Absolute: 0.2 10*3/uL — ABNORMAL HIGH (ref 0.0–0.1)
Basophils Relative: 2 %
Eosinophils Absolute: 0.3 10*3/uL (ref 0.0–0.5)
Eosinophils Relative: 3 %
HCT: 32 % — ABNORMAL LOW (ref 36.0–46.0)
Hemoglobin: 10.1 g/dL — ABNORMAL LOW (ref 12.0–15.0)
Lymphocytes Relative: 40 %
Lymphs Abs: 4.2 10*3/uL — ABNORMAL HIGH (ref 0.7–4.0)
MCH: 28.4 pg (ref 26.0–34.0)
MCHC: 31.6 g/dL (ref 30.0–36.0)
MCV: 89.9 fL (ref 80.0–100.0)
Monocytes Absolute: 0.5 10*3/uL (ref 0.1–1.0)
Monocytes Relative: 5 %
Neutro Abs: 5.3 10*3/uL (ref 1.7–7.7)
Neutrophils Relative %: 50 %
Platelets: 430 10*3/uL — ABNORMAL HIGH (ref 150–400)
RBC: 3.56 MIL/uL — ABNORMAL LOW (ref 3.87–5.11)
RDW: 16.8 % — ABNORMAL HIGH (ref 11.5–15.5)
WBC: 10.6 10*3/uL — ABNORMAL HIGH (ref 4.0–10.5)
nRBC: 0 % (ref 0.0–0.2)
nRBC: 0 /100 WBC

## 2022-03-18 LAB — COMPREHENSIVE METABOLIC PANEL
ALT: 15 U/L (ref 0–44)
AST: 24 U/L (ref 15–41)
Albumin: 2.7 g/dL — ABNORMAL LOW (ref 3.5–5.0)
Alkaline Phosphatase: 57 U/L (ref 38–126)
Anion gap: 13 (ref 5–15)
BUN: <5 mg/dL — ABNORMAL LOW (ref 6–20)
CO2: 21 mmol/L — ABNORMAL LOW (ref 22–32)
Calcium: 8.7 mg/dL — ABNORMAL LOW (ref 8.9–10.3)
Chloride: 103 mmol/L (ref 98–111)
Creatinine, Ser: 0.65 mg/dL (ref 0.44–1.00)
GFR, Estimated: >60 mL/min (ref >60)
Glucose, Bld: 93 mg/dL (ref 70–99)
Potassium: 3.2 mmol/L — ABNORMAL LOW (ref 3.5–5.1)
Sodium: 137 mmol/L (ref 135–145)
Total Bilirubin: 0.8 mg/dL (ref 0.3–1.2)
Total Protein: 7.6 g/dL (ref 6.5–8.1)

## 2022-03-18 LAB — MAGNESIUM: Magnesium: 1.3 mg/dL — ABNORMAL LOW (ref 1.7–2.4)

## 2022-03-18 LAB — I-STAT BETA HCG BLOOD, ED (MC, WL, AP ONLY): I-stat hCG, quantitative: <5 m[IU]/mL (ref <5)

## 2022-03-18 LAB — CULTURE, BLOOD (ROUTINE X 2)
Culture: NO GROWTH
Culture: NO GROWTH
Special Requests: ADEQUATE

## 2022-03-18 LAB — LACTIC ACID, PLASMA: Lactic Acid, Venous: 0.8 mmol/L (ref 0.5–1.9)

## 2022-03-18 LAB — LIPASE, BLOOD: Lipase: 29 U/L (ref 11–51)

## 2022-03-18 MED ORDER — POTASSIUM CHLORIDE 2 MEQ/ML IV SOLN: INTRAVENOUS | Status: DC

## 2022-03-18 MED ORDER — TRAZODONE HCL 50 MG PO TABS
50.0000 mg | ORAL_TABLET | Freq: Every day | ORAL | Status: DC
  Filled 2022-03-18 (×3): qty 1

## 2022-03-18 MED ORDER — ONDANSETRON HCL 4 MG PO TABS: 4.0000 mg | ORAL_TABLET | Freq: Four times a day (QID) | ORAL | Status: DC | PRN

## 2022-03-18 MED ORDER — CYCLOBENZAPRINE HCL 5 MG PO TABS
5.0000 mg | ORAL_TABLET | Freq: Three times a day (TID) | ORAL | Status: DC | PRN
  Administered 2022-03-18: 5 mg via ORAL
  Filled 2022-03-18: qty 1

## 2022-03-18 MED ORDER — LACTATED RINGERS IV SOLN: INTRAVENOUS | Status: DC

## 2022-03-18 MED ORDER — FUROSEMIDE 20 MG PO TABS
40.0000 mg | ORAL_TABLET | Freq: Every day | ORAL | Status: DC
  Administered 2022-03-18: 40 mg via ORAL
  Filled 2022-03-18: qty 2

## 2022-03-18 MED ORDER — ONDANSETRON HCL 4 MG/2ML IJ SOLN
4.0000 mg | Freq: Once | INTRAMUSCULAR | Status: AC
  Administered 2022-03-18: 4 mg via INTRAVENOUS
  Filled 2022-03-18: qty 2

## 2022-03-18 MED ORDER — SODIUM CHLORIDE 0.9 % IV BOLUS
1000.0000 mL | Freq: Once | INTRAVENOUS | Status: AC
  Administered 2022-03-18: 1000 mL via INTRAVENOUS

## 2022-03-18 MED ORDER — METRONIDAZOLE 500 MG PO TABS
500.0000 mg | ORAL_TABLET | Freq: Two times a day (BID) | ORAL | Status: DC
  Filled 2022-03-18 (×3): qty 1

## 2022-03-18 MED ORDER — PANTOPRAZOLE SODIUM 40 MG PO TBEC
40.0000 mg | DELAYED_RELEASE_TABLET | Freq: Every day | ORAL | Status: DC
  Administered 2022-03-18 – 2022-03-23 (×2): 40 mg via ORAL
  Filled 2022-03-18 (×3): qty 1

## 2022-03-18 MED ORDER — HYDROMORPHONE HCL 1 MG/ML IJ SOLN
1.0000 mg | Freq: Once | INTRAMUSCULAR | Status: AC
  Administered 2022-03-18: 1 mg via INTRAVENOUS
  Filled 2022-03-18: qty 1

## 2022-03-18 MED ORDER — POTASSIUM CHLORIDE 2 MEQ/ML IV SOLN
INTRAVENOUS | Status: AC
  Filled 2022-03-18 (×2): qty 1000

## 2022-03-18 MED ORDER — OXYCODONE HCL 5 MG PO TABS
10.0000 mg | ORAL_TABLET | ORAL | Status: DC | PRN
  Administered 2022-03-18 – 2022-03-22 (×7): 10 mg via ORAL
  Filled 2022-03-18 (×9): qty 2

## 2022-03-18 MED ORDER — ENOXAPARIN SODIUM 40 MG/0.4ML IJ SOSY
40.0000 mg | PREFILLED_SYRINGE | INTRAMUSCULAR | Status: DC
  Administered 2022-03-19 – 2022-03-27 (×2): 40 mg via SUBCUTANEOUS
  Filled 2022-03-18 (×6): qty 0.4

## 2022-03-18 MED ORDER — POTASSIUM CHLORIDE 10 MEQ/100ML IV SOLN
10.0000 meq | INTRAVENOUS | Status: AC
  Administered 2022-03-18 (×2): 10 meq via INTRAVENOUS
  Filled 2022-03-18 (×2): qty 100

## 2022-03-18 MED ORDER — CEFADROXIL 500 MG PO CAPS
1000.0000 mg | ORAL_CAPSULE | Freq: Two times a day (BID) | ORAL | Status: DC
Start: 2022-03-18 — End: 2022-03-20
  Filled 2022-03-18 (×4): qty 2

## 2022-03-18 MED ORDER — GABAPENTIN 300 MG PO CAPS
300.0000 mg | ORAL_CAPSULE | Freq: Three times a day (TID) | ORAL | Status: DC
  Administered 2022-03-18 – 2022-03-23 (×5): 300 mg via ORAL
  Filled 2022-03-18 (×12): qty 1

## 2022-03-18 MED ORDER — HYDROXYZINE HCL 10 MG PO TABS: 10.0000 mg | ORAL_TABLET | Freq: Three times a day (TID) | ORAL | Status: DC | PRN

## 2022-03-18 MED ORDER — HYDROMORPHONE HCL 1 MG/ML IJ SOLN
0.5000 mg | INTRAMUSCULAR | Status: AC | PRN
  Administered 2022-03-18 – 2022-03-19 (×3): 0.5 mg via INTRAVENOUS
  Filled 2022-03-18 (×3): qty 0.5

## 2022-03-18 MED ORDER — POLYETHYLENE GLYCOL 3350 17 G PO PACK: 17.0000 g | PACK | Freq: Every day | ORAL | Status: DC | PRN

## 2022-03-18 MED ORDER — IOHEXOL 300 MG/ML  SOLN
100.0000 mL | Freq: Once | INTRAMUSCULAR | Status: AC | PRN
  Administered 2022-03-18: 100 mL via INTRAVENOUS

## 2022-03-18 MED ORDER — ONDANSETRON HCL 4 MG/2ML IJ SOLN
4.0000 mg | Freq: Four times a day (QID) | INTRAMUSCULAR | Status: DC | PRN
  Administered 2022-03-18 – 2022-03-27 (×7): 4 mg via INTRAVENOUS
  Filled 2022-03-18 (×9): qty 2

## 2022-03-18 MED ORDER — VALACYCLOVIR HCL 500 MG PO TABS
1000.0000 mg | ORAL_TABLET | Freq: Two times a day (BID) | ORAL | Status: DC
  Administered 2022-03-23: 1000 mg via ORAL
  Filled 2022-03-18 (×13): qty 2

## 2022-03-18 NOTE — ED Notes (Signed)
Pt to CT at this time, alert, NAD, calm

## 2022-03-18 NOTE — ED Notes (Addendum)
CT notified pt ready. IVF KVO on pump.

## 2022-03-18 NOTE — H&P (Addendum)
Date: 03/18/2022               Patient Name:  Paula Lucas MRN: [id number]  DOB: 1991/03/26 Age / Sex: 31 y.o., female   PCP: Pcp, No         Medical Service: Internal Medicine Teaching Service         Attending Physician: Dr. Reymundo Poll, MD    First Contact: Dr. Cliffton Asters Pager: [phone]  Second Contact: Dr. Austin Miles Pager: [phone]       After Hours (After 5p/  First Contact Pager: [phone]  weekends / holidays): Second Contact Pager: [phone]   Chief Complaint: abdominal pain, nausea and vomiting  History of Present Illness: [name] is a 53 yoF with multiple recent complicated admissions for intra-abdominal abscesses treated with drain placements and prolonged antibiotic presenting today with persistent abdominal pain nausea and vomiting.    She has had a long and complicated hospital course initially admitted 6/15 for septic shock due to pelvic floor and tubo-ovarian abscesses.  She left AMA on 6/18 subsequently returning to the ED on 619 admitted until 7/9.  Patient had an exploratory laparotomy with abdominal washout on 6/27.  She was admitted again from 7/22 to 7/25 due to fever and abdominal pain.  OB/GYN was consulted but did not recommend further surgical intervention.  Patient was treated with cefepime and Flagyl discharged on cefadroxil and Flagyl.  She states that since she has been home her pain is slightly better but she has had persistent nausea and nonbloody nonbilious bilious emesis.  She rates the pain an 8 out of 10, 10 out of 10 when she first arrived to the ED she thinks that the antibiotic she is taking are contributing to her nausea and vomiting.  She denies any fevers but states that she has woken up several times with sweats.  Denies any shortness of breath, lightheadedness, dizziness, chest pain, diarrhea or constipation.  States that she took the oxycodone that was prescribed to her at discharge but this was not enough to control her pain.  She  states that her drains have not been flushing and she has noticed a change in the drain output of one of them to be more cloudy.  She has been changing the dressing on her abdomen morning and night.  States that no one from wound care has been to her home since discharge.  She states that she has not been eating since being diagnosed with COVID.  She does not have an appetite and states that she will likely not eat while she is in the hospital.  She has been taking all her medications as prescribed.  States that she was to have her drains removed tomorrow at an appointment.     Meds: No current facility-administered medications on file prior to encounter.   Current Outpatient Medications on File Prior to Encounter  Medication Sig Dispense Refill   cefadroxil (DURICEF) 500 MG capsule Take 2 capsules (1,000 mg total) by mouth 2 (two) times daily. 120 capsule 0   cyclobenzaprine (FLEXERIL) 5 MG tablet Take 1 tablet (5 mg total) by mouth 3 (three) times daily as needed for muscle spasms. 45 tablet 0   furosemide (LASIX) 40 MG tablet Take 1 tablet (40 mg total) by mouth daily. 14 tablet 0   gabapentin (NEURONTIN) 300 MG capsule Take 1 capsule (300 mg total) by mouth 3 (three) times daily. 30 capsule 1   hydrOXYzine (ATARAX) 10 MG tablet Take  1 tablet (10 mg total) by mouth 3 (three) times daily as needed for anxiety. 30 tablet 0   metroNIDAZOLE (FLAGYL) 500 MG tablet Take 1 tablet (500 mg total) by mouth 2 (two) times daily. 60 tablet 0   naloxone (NARCAN) nasal spray 4 mg/0.1 mL Use 1 spray in the nose as needed. (Patient not taking: Reported on 03/12/2022) 2 each 0   oxyCODONE (ROXICODONE) 5 MG immediate release tablet Take 1 tablet (5 mg total) by mouth every 4 (four) hours as needed for up to 5 days for severe pain. 30 tablet 0   pantoprazole (PROTONIX) 40 MG tablet Take 1 tablet (40 mg total) by mouth daily. 30 tablet 0   potassium chloride (KLOR-CON M) 10 MEQ tablet Take 2 tablets (20 mEq total)  by mouth 2 (two) times daily. 28 tablet 0   prochlorperazine (COMPAZINE) 10 MG tablet Take 1 tablet (10 mg total) by mouth every 6 (six) hours as needed for nausea or vomiting. 45 tablet 0   traZODone (DESYREL) 50 MG tablet Take 1 tablet (50 mg total) by mouth at bedtime. 15 tablet 0   valACYclovir (VALTREX) 1000 MG tablet Take 1 tablet (1,000 mg total) by mouth 2 (two) times daily. 30 tablet 1     Allergies: Allergies as of 03/18/2022 - Review Complete 03/13/2022  Allergen Reaction Noted   Fish allergy Anaphylaxis 03/15/2011   Motrin [ibuprofen] Anaphylaxis 02/04/2022   Mushroom extract complex Anaphylaxis 03/06/2018   Shellfish-derived products Anaphylaxis 08/21/2018   Tylenol [acetaminophen] Anaphylaxis 04/01/2018   Past Medical History:  Diagnosis Date   Asthma    BMI 31.0-31.9,adult    Chlamydia    Gonorrhea    TOA (tubo-ovarian abscess)     Family History:  Family History  Problem Relation Age of Onset   Diabetes Mother    Hypertension Mother    Diabetes Father    Hypertension Father    Diabetes Sister    Hypertension Sister      Social History: Lives in [place] with her mother.  Quit alcohol and tobacco use this past year.  Denies any illicit drug use.  Review of Systems: A complete ROS was negative except as per HPI.   Physical Exam: Blood pressure 110/82, pulse 79, temperature 97.8 F (36.6 C), temperature source Oral, resp. rate 13, last menstrual period 10/26/2021, SpO2 96 %, unknown if currently breastfeeding. Physical Exam General: alert, appears stated age, in no acute distress HEENT: Normocephalic, atraumatic, EOM intact, conjunctiva normal, dry oral mucosa CV: Regular rate and rhythm, no murmurs rubs or gallops Pulm: Clear to auscultation bilaterally, normal work of breathing Abdomen: Soft, nondistended, bowel sounds present, no guarding, significant tenderness to mild palpation, 2 intra-abdominal drains in place 1 with cloudy appearing  drainage MSK: No lower extremity edema Skin: Warm and dry Neuro: Alert and oriented x3     EKG: personally reviewed my interpretation is sinus tachycardia  CXR: personally reviewed my interpretation is n/a  CT abdomen pelvis w/ contrast: IMPRESSION: 1. Interval slight decrease in diffuse mesenteric and peritoneal inflammation/edema. The inflammatory change is centered around the percutaneous right ventral abdominal wall drainage catheters which are unchanged in position. 2. Similar inflammation of a loop of small bowel within the anterior abdomen and pelvis along the inflammatory change. 3. Stable size of the fluid collections within the pelvis and inferior to the right hepatic lobe. 4. Unchanged small bilateral pleural effusions and associated atelectasis/consolidation.  Assessment & Plan by Problem: Principal Problem:   Pelvic abscess  Active Problems:   Hypokalemia  Ms. Timesha Cervantez is a 16 yoF with multiple recent complicated admissions for intra-abdominal abscesses treated with drain placements and prolonged antibiotic presenting today with persistent abdominal pain nausea and vomiting.   Pelvic abscess Multiple recent complicated admissions for intra-abdominal abscesses now status post exploratory lap with washout and 2 JP drain placements.  Patient presents with persistent abdominal pain, nausea and vomiting.  Vitals are stable and labs showed no leukocytosis, normal lactic acid.  Patient was given IV fluids in the ER and repeat CT abdomen showed slight interval decrease in inflammation with stable size of fluid collections in the pelvis and inferior to the right hepatic lobe.  Drainage catheters appear unchanged in position.  Patient states that the drainage output has changed and is now cloudy in appearance; per chart review this seems stable from prior admission.  She also states she is unable to flush the drains, will assess patency.  I suspect that her persistent  abdominal pain and nausea is in the setting of persistent inflammation from intra-abdominal abscesses.  JP drains continue to have output and CT abdomen confirmed stable size of fluid collections within the pelvis.  Recommend continuing oral antibiotics, cefadroxil and Flagyl, continue IV fluids and monitoring JP drain output.  Pain control has been difficult, she received a dose of fentanyl and multiple doses of Dilaudid in the ER with minimal improvement of her pain.  Will try an oral regimen with IV for breakthrough pain. -Follow blood cultures -Continue cefadroxil and Flagyl -Monitor JP drain output -Flush drains to assess patency -Continue IV fluids in the setting of poor oral intake -Oxycodone 10 mg every 6 hours as needed for moderate pain -IV Dilaudid 0.5 mg every 3 hours for severe pain  Hypokalemia K3.2.  IV potassium runs ordered by EDP however patient is having a hard time tolerating this.  Is declining oral potassium at this time.  Will reattempt IV potassium at a slower rate and if she does not tolerate this can supplement potassium in her IV fluids. -Trend BMP -Follow-up magnesium  Anemia of inflammation Thrombocytosis Hemoglobin stable at 10, MCV 89.  Platelets 430.  Likely in the setting of multiple intra-abdominal abscesses. -Trend CBC  Protein calorie malnutrition Poor oral intake likely in the setting of intra-abdominal inflammation.  Dietitian was consulted during most recent admission and recommended family or friends bring foods she likes.  Encouraged her to do this again.  Continue IV fluids and encouraged patient to eat.  Recent COVID-19 infection Patient tested positive on 7/22.  She remained asymptomatic, stable on room air.  Today is her last day of quarantine. -Continue airborne precautions until 7/28  Diet: Regular VTE prophylaxis: Lovenox Full code  Dispo: Admit patient to Observation with expected length of stay less than 2 midnights.  Signed: Jaci Standard, DO 03/18/2022, 12:32 PM  After 5pm on weekdays and 1pm on weekends: On Call pager: [phone]

## 2022-03-18 NOTE — ED Notes (Signed)
Pt tolerating IV KCL slower than usual. Refusing some oral meds. Requesting IV pain med.

## 2022-03-18 NOTE — ED Notes (Signed)
Refused PO antibiotics and antiviral, states, "they are why I am sick, and I don't want to swallow any more pills". Asking about when and what IV pain med she will be getting.

## 2022-03-18 NOTE — Consult Note (Signed)
Patient known to [facility] Nursing team, recently DC 7/25. NPWT had been DCed and saline gauze dressings were initiated appropriately.   Added wound care orders.   Chaden Doom [facility], CNS, [employer] [phone]

## 2022-03-18 NOTE — ED Provider Notes (Signed)
[facility] EMERGENCY DEPARTMENT Provider Note   CSN: [id number] Arrival date & time: 03/18/22  1025     History  Chief Complaint  Patient presents with   Abdominal Pain    Paula Lucas is a 31 y.o. female with a history of tubo-ovarian abscess, asthma.  Presents to the emergency department with a chief complaint of generalized abdominal pain, nausea, vomiting, and drain output change.  Patient reports that her abdominal pain began yesterday.  Pain is generalized throughout her entire abdomen however worse to her lower abdomen.  Patient rates pain 10/10 on the pain scale.  Pain is worse with touch and movement.  Patient has been taking her prescribed pain medication without relief of symptoms.  Patient states that she noticed that one of her drains had a change in output with the last 24 hours.  Patient reports that drainage became thick and tan color.  Patient reports no problems with her other drain.  Patient endorses nausea and vomiting.  Reports that she has had persistent nausea and vomiting over the last few days.  Patient has vomited 7-8 times in the last 24 hours.  Patient describes emesis as clear to yellow in color.  Denies any hematemesis or coffee-ground emesis.  Patient reports that she has had very little p.o. intake since being discharged from the hospital due to her nausea and vomiting.  Additionally patient reports subjective fevers and chills.  Denies any constipation, diarrhea, blood in stool, melena, dysuria, hematuria, urinary urgency, vaginal pain, vaginal bleeding, vaginal discharge.       Home Medications Prior to Admission medications   Medication Sig Start Date End Date Taking? Authorizing Provider  cefadroxil (DURICEF) 500 MG capsule Take 2 capsules (1,000 mg total) by mouth 2 (two) times daily. 03/16/22   Adron Bene, MD  cyclobenzaprine (FLEXERIL) 5 MG tablet Take 1 tablet (5 mg total) by mouth 3 (three) times daily as needed  for muscle spasms. 03/04/22   Love, Evlyn Kanner, PA-C  furosemide (LASIX) 40 MG tablet Take 1 tablet (40 mg total) by mouth daily. 03/06/22   Love, Evlyn Kanner, PA-C  gabapentin (NEURONTIN) 300 MG capsule Take 1 capsule (300 mg total) by mouth 3 (three) times daily. 03/11/22   Warden Fillers, MD  hydrOXYzine (ATARAX) 10 MG tablet Take 1 tablet (10 mg total) by mouth 3 (three) times daily as needed for anxiety. 03/04/22   Love, Evlyn Kanner, PA-C  metroNIDAZOLE (FLAGYL) 500 MG tablet Take 1 tablet (500 mg total) by mouth 2 (two) times daily. 03/16/22   Adron Bene, MD  naloxone [facility]) nasal spray 4 mg/0.1 mL Use 1 spray in the nose as needed. Patient not taking: Reported on 03/12/2022 03/04/22   Love, Evlyn Kanner, PA-C  oxyCODONE (ROXICODONE) 5 MG immediate release tablet Take 1 tablet (5 mg total) by mouth every 4 (four) hours as needed for up to 5 days for severe pain. 03/16/22 03/21/22  Merrilyn Puma, MD  pantoprazole (PROTONIX) 40 MG tablet Take 1 tablet (40 mg total) by mouth daily. 03/04/22   Love, Evlyn Kanner, PA-C  potassium chloride (KLOR-CON M) 10 MEQ tablet Take 2 tablets (20 mEq total) by mouth 2 (two) times daily. 03/05/22   Love, Evlyn Kanner, PA-C  prochlorperazine (COMPAZINE) 10 MG tablet Take 1 tablet (10 mg total) by mouth every 6 (six) hours as needed for nausea or vomiting. 03/04/22   Love, Evlyn Kanner, PA-C  traZODone (DESYREL) 50 MG tablet Take 1 tablet (50 mg total)  by mouth at bedtime. 03/04/22   Love, Evlyn Kanner, PA-C  valACYclovir (VALTREX) 1000 MG tablet Take 1 tablet (1,000 mg total) by mouth 2 (two) times daily. 03/16/22   Adron Bene, MD      Allergies    Fish allergy, Motrin [ibuprofen], Mushroom extract complex, Shellfish-derived products, and Tylenol [acetaminophen]    Review of Systems   Review of Systems  Gastrointestinal:  Positive for abdominal pain.    Physical Exam Updated Vital Signs BP 115/87   Pulse 99   Temp 97.7 F (36.5 C) (Rectal)   Resp (!) 0   LMP 10/26/2021   SpO2  96%   Physical Exam Vitals and nursing note reviewed.  Constitutional:      General: She is not in acute distress.    Appearance: She is not ill-appearing, toxic-appearing or diaphoretic.  HENT:     Head: Normocephalic.  Eyes:     General: No scleral icterus.       Right eye: No discharge.        Left eye: No discharge.  Cardiovascular:     Rate and Rhythm: Tachycardia present.  Pulmonary:     Effort: Pulmonary effort is normal.  Abdominal:     General: Abdomen is flat. There is no distension. There are no signs of injury.     Palpations: Abdomen is soft. There is no mass or pulsatile mass.     Tenderness: There is abdominal tenderness. There is guarding. There is no rebound.     Hernia: There is no hernia in the umbilical area or ventral area.     Comments: Midline surgical incision.  Wound is clean, dry, with no surrounding erythema or purulent discharge.  2 JP drains to right lower quadrant.  Thick tan fluid noted to 1 JP drain.  The other drain is empty.  Diffuse tenderness throughout entire abdomen with increased tenderness to lower abdomen.  Skin:    General: Skin is warm and dry.  Neurological:     General: No focal deficit present.     Mental Status: She is alert.  Psychiatric:        Behavior: Behavior is cooperative.     ED Results / Procedures / Treatments   Labs (all labs ordered are listed, but only abnormal results are displayed) Labs Reviewed - No data to display  EKG None  Radiology No results found.  Procedures Procedures    Medications Ordered in ED Medications - No data to display  ED Course/ Medical Decision Making/ A&P                           Medical Decision Making Amount and/or Complexity of Data Reviewed Labs: ordered. Radiology: ordered.  Risk Prescription drug management.   Alert 31 year old female in no acute distress, nontoxic-appearing.  Presents the emergency department with a chief complaint of abdominal pain, nausea,  vomiting, and change in drain output  Information was obtained from patient.  I reviewed patient's past medical records including admission/discharge notes, previous provider notes, labs, and imaging.  Patient has medical history as outlined in HPI which complicates her care.  Per chart review was admitted to the hospital on 02/04/2022 for septic shock due to pelvic floor abscess and tubo-ovarian abscess.  Patient left AGAINST MEDICAL ADVICE on 02/07/2022.  She returned to the emergency department on 6/19 and was admitted until 7/9.  Patient had exploratory laparotomy with abdominal washout on 6/27 performed by Dr. Shawnie Pons  and Grandville Silos.  Patient was again admitted 7/27 -7/25 due to fever and abdominal pain.  OB/GYN was consulted and did not recommend surgical intervention.  Patient was treated with IV cefepime and Flagyl.  With tachycardia and abdominal pain in the setting of surgery and previous pelvic floor abscess and tubo-ovarian abscess concern for sepsis.  Sepsis work-up initiated at this time.  Additionally will obtain CT abdomen pelvis with contrast to look for postop complications versus infection.  Patient given Dilaudid, fluids, and Zofran for symptomatic treatment.  Patient care discussed with attending physician Dr. Dina Rich.  Lab work pending at this time.  Disposition pending CT imaging and lab results.  Patient care transferred to [place] at the end of my shift. Patient presentation, ED course, and plan of care discussed with review of all pertinent labs and imaging. Please see his/her note for further details regarding further ED course and disposition.         Final Clinical Impression(s) / ED Diagnoses Final diagnoses:  None    Rx / DC Orders ED Discharge Orders     None         Loni Beckwith, PA-C 03/18/22 [id number]    Merryl Hacker, MD 03/18/22 [phone]

## 2022-03-18 NOTE — Progress Notes (Signed)
Patient refused PO Gabapentin that was scheduled for 4pm. Rehman,DO made aware. Also patient asking to not get anymore IV potassium she still needs 2 more hung. Waiting on DO to reply on stopping it on not

## 2022-03-18 NOTE — Progress Notes (Signed)
Patient arrived to 6 north room 6 alert and oriented x4. Pain level 5/10. LR and potassium running. Patient refusing PO meds at this time. Will continue to monitor patient.

## 2022-03-18 NOTE — ED Notes (Addendum)
KCL run infusing slower than standard, and being diluted more than standard (50:125 / hr) for comfort, attending aware. Alert, NAD, calm, interactive. VSS. No changes.

## 2022-03-18 NOTE — ED Triage Notes (Signed)
BIB [facility] after pt c/o 10/10 abd. Pain, nausea, and poor PO intake. Pt had abdominal drains placed x 3 weeks ago related to pelvic abscesses. Pt was d/c from [facility] yesterday.

## 2022-03-18 NOTE — ED Provider Notes (Signed)
31 year old female with past medical history significant for recent tubo-ovarian abscess with prolonged admission, and subsequent admission discharged 7/25 returns for evaluation of worsening abdominal pain, change in drain output, persistent nausea vomiting.  She states pain has been persistent since discharge.  Nausea vomiting is new since discharge.  She states the drain output is new as well.  At the time of shift change patient is awaiting her blood work which has been collected, and CT abdomen pelvis.  Blood cultures have been obtained.  Patient is on home antibiotics which she is compliant with and took last dose last night.  Initially tachycardic which is improved following pain medication.  Patient is afebrile.   Physical Exam  BP 124/88   Pulse 98   Temp 97.7 F (36.5 C) (Rectal)   Resp (!) 26   LMP 10/26/2021   SpO2 100%     Procedures  Procedures  ED Course / MDM    Medical Decision Making Amount and/or Complexity of Data Reviewed Labs: ordered. Radiology: ordered.  Risk Prescription drug management. Decision regarding hospitalization.   CBC with mild leukocytosis which is baseline for patient.  CMP shows mild hypokalemia, and preserved renal function.  Normal lactic acid.  CT shows slight decrease in diffuse mesenteric and peritoneal inflammation/edema.  Overall low acute presence of abscess or other concerns.  Given ongoing pain and multiple rounds of IV pain medication will discuss with internal medicine team for admission.  Patient discussed with internal medicine team.  They will evaluate patient for admission.    Marita Kansas, PA-C 03/18/22 1127    Arby Barrette, MD 03/24/22 [phone]

## 2022-03-19 ENCOUNTER — Ambulatory Visit: Payer: Medicaid Other | Admitting: Obstetrics and Gynecology

## 2022-03-19 DIAGNOSIS — Z87891 Personal history of nicotine dependence: Secondary | ICD-10-CM | POA: Diagnosis not present

## 2022-03-19 DIAGNOSIS — Z91013 Allergy to seafood: Secondary | ICD-10-CM | POA: Diagnosis not present

## 2022-03-19 DIAGNOSIS — N739 Female pelvic inflammatory disease, unspecified: Secondary | ICD-10-CM | POA: Diagnosis present

## 2022-03-19 DIAGNOSIS — K567 Ileus, unspecified: Secondary | ICD-10-CM | POA: Diagnosis present

## 2022-03-19 DIAGNOSIS — D6489 Other specified anemias: Secondary | ICD-10-CM | POA: Diagnosis present

## 2022-03-19 DIAGNOSIS — Y92239 Unspecified place in hospital as the place of occurrence of the external cause: Secondary | ICD-10-CM | POA: Diagnosis present

## 2022-03-19 DIAGNOSIS — R21 Rash and other nonspecific skin eruption: Secondary | ICD-10-CM | POA: Diagnosis present

## 2022-03-19 DIAGNOSIS — D75839 Thrombocytosis, unspecified: Secondary | ICD-10-CM | POA: Diagnosis present

## 2022-03-19 DIAGNOSIS — Z6831 Body mass index (BMI) 31.0-31.9, adult: Secondary | ICD-10-CM | POA: Diagnosis not present

## 2022-03-19 DIAGNOSIS — E878 Other disorders of electrolyte and fluid balance, not elsewhere classified: Secondary | ICD-10-CM

## 2022-03-19 DIAGNOSIS — F250 Schizoaffective disorder, bipolar type: Secondary | ICD-10-CM | POA: Diagnosis present

## 2022-03-19 DIAGNOSIS — Z833 Family history of diabetes mellitus: Secondary | ICD-10-CM | POA: Diagnosis not present

## 2022-03-19 DIAGNOSIS — Z8249 Family history of ischemic heart disease and other diseases of the circulatory system: Secondary | ICD-10-CM | POA: Diagnosis not present

## 2022-03-19 DIAGNOSIS — L539 Erythematous condition, unspecified: Secondary | ICD-10-CM | POA: Diagnosis present

## 2022-03-19 DIAGNOSIS — Z8616 Personal history of COVID-19: Secondary | ICD-10-CM | POA: Diagnosis not present

## 2022-03-19 DIAGNOSIS — Z79899 Other long term (current) drug therapy: Secondary | ICD-10-CM | POA: Diagnosis not present

## 2022-03-19 DIAGNOSIS — Z888 Allergy status to other drugs, medicaments and biological substances status: Secondary | ICD-10-CM | POA: Diagnosis not present

## 2022-03-19 DIAGNOSIS — Z91018 Allergy to other foods: Secondary | ICD-10-CM | POA: Diagnosis not present

## 2022-03-19 DIAGNOSIS — W2203XA Walked into furniture, initial encounter: Secondary | ICD-10-CM | POA: Diagnosis not present

## 2022-03-19 DIAGNOSIS — G4700 Insomnia, unspecified: Secondary | ICD-10-CM | POA: Diagnosis present

## 2022-03-19 DIAGNOSIS — G629 Polyneuropathy, unspecified: Secondary | ICD-10-CM | POA: Diagnosis present

## 2022-03-19 DIAGNOSIS — F4310 Post-traumatic stress disorder, unspecified: Secondary | ICD-10-CM | POA: Diagnosis present

## 2022-03-19 DIAGNOSIS — E46 Unspecified protein-calorie malnutrition: Secondary | ICD-10-CM | POA: Diagnosis present

## 2022-03-19 DIAGNOSIS — E876 Hypokalemia: Secondary | ICD-10-CM | POA: Diagnosis present

## 2022-03-19 LAB — CBC WITH DIFFERENTIAL/PLATELET
Abs Immature Granulocytes: 0 10*3/uL (ref 0.00–0.07)
Basophils Absolute: 0 10*3/uL (ref 0.0–0.1)
Basophils Relative: 0 %
Eosinophils Absolute: 0.5 10*3/uL (ref 0.0–0.5)
Eosinophils Relative: 7 %
HCT: 30.8 % — ABNORMAL LOW (ref 36.0–46.0)
Hemoglobin: 9.9 g/dL — ABNORMAL LOW (ref 12.0–15.0)
Lymphocytes Relative: 48 %
Lymphs Abs: 3.6 10*3/uL (ref 0.7–4.0)
MCH: 28.2 pg (ref 26.0–34.0)
MCHC: 32.1 g/dL (ref 30.0–36.0)
MCV: 87.7 fL (ref 80.0–100.0)
Monocytes Absolute: 0.5 10*3/uL (ref 0.1–1.0)
Monocytes Relative: 7 %
Neutro Abs: 2.9 10*3/uL (ref 1.7–7.7)
Neutrophils Relative %: 38 %
Platelets: 387 10*3/uL (ref 150–400)
RBC: 3.51 MIL/uL — ABNORMAL LOW (ref 3.87–5.11)
RDW: 17.1 % — ABNORMAL HIGH (ref 11.5–15.5)
WBC: 7.6 10*3/uL (ref 4.0–10.5)
nRBC: 0 % (ref 0.0–0.2)
nRBC: 0 /100 WBC

## 2022-03-19 LAB — BASIC METABOLIC PANEL
Anion gap: 10 (ref 5–15)
BUN: <5 mg/dL — ABNORMAL LOW (ref 6–20)
CO2: 26 mmol/L (ref 22–32)
Calcium: 8.2 mg/dL — ABNORMAL LOW (ref 8.9–10.3)
Chloride: 104 mmol/L (ref 98–111)
Creatinine, Ser: 0.61 mg/dL (ref 0.44–1.00)
GFR, Estimated: >60 mL/min (ref >60)
Glucose, Bld: 84 mg/dL (ref 70–99)
Potassium: 3 mmol/L — ABNORMAL LOW (ref 3.5–5.1)
Sodium: 140 mmol/L (ref 135–145)

## 2022-03-19 MED ORDER — POTASSIUM CHLORIDE 10 MEQ/100ML IV SOLN
10.0000 meq | INTRAVENOUS | Status: AC
  Administered 2022-03-19 (×2): 10 meq via INTRAVENOUS
  Filled 2022-03-19 (×2): qty 100

## 2022-03-19 MED ORDER — MAGNESIUM SULFATE 2 GM/50ML IV SOLN
2.0000 g | Freq: Once | INTRAVENOUS | Status: AC
Start: 2022-03-19 — End: 2022-03-19
  Administered 2022-03-19: 2 g via INTRAVENOUS
  Filled 2022-03-19: qty 50

## 2022-03-19 MED ORDER — HYDROMORPHONE HCL 1 MG/ML IJ SOLN
0.5000 mg | INTRAMUSCULAR | Status: DC | PRN
  Administered 2022-03-19 – 2022-03-21 (×10): 0.5 mg via INTRAVENOUS
  Filled 2022-03-19 (×11): qty 0.5

## 2022-03-19 NOTE — Progress Notes (Signed)
HD#0 Subjective:   Summary: Paula Lucas is a 33 yoF with multiple recent complicated admissions for intra-abdominal abscesses treated with drain placements and prolonged antibiotic presenting today with persistent abdominal pain, nausea, and vomiting.    Overnight Events: NAEO.  She reports her nausea and vomiting are better. Her pain is 7/10 right now which is improved from yesterday. She says she was supposed to see OBGYN today to get drains removed.   Objective:  Vital signs in last 24 hours: Vitals:   03/18/22 1530 03/19/22 0130 03/19/22 0516 03/19/22 0917  BP: 105/76 111/78 129/88 116/87  Pulse: 76 90 71 84  Resp: 16   18  Temp: 98.1 F (36.7 C) 98.4 F (36.9 C)  98.2 F (36.8 C)  TempSrc: Oral Oral  Oral  SpO2: 95% 97% 96% 97%   Supplemental O2: Room Air SpO2: 97 %   Physical Exam:  Constitutional: well-appearing female sitting in hospital bed, in no acute distress HENT: normocephalic atraumatic, mucous membranes moist Eyes: conjunctiva non-erythematous Neck: supple Cardiovascular: regular rate and rhythm, no m/r/g Pulmonary/Chest: normal work of breathing on room air Abdominal: Distended. Tenderness to palpation at JP drain insertion sites, no erythema/discharge. Bulbs draining cloudy fluid. Neurological: alert & oriented x 3 Skin: warm and dry Psych: Normal mood and affect.  There were no vitals filed for this visit.   Intake/Output Summary (Last 24 hours) at 03/19/2022 1041 Last data filed at 03/19/2022 0400 Gross per 24 hour  Intake 840.15 ml  Output 2350 ml  Net -1509.85 ml   Net IO Since Admission: -509.85 mL [03/19/22 1041]  Pertinent Labs:    Latest Ref Rng & Units 03/19/2022    4:30 AM 03/18/2022    8:05 AM 03/18/2022    5:08 AM  CBC  WBC 4.0 - 10.5 K/uL 7.6   10.6   Hemoglobin 12.0 - 15.0 g/dL 9.9  84.1  66.0   Hematocrit 36.0 - 46.0 % 30.8  30.0  32.0   Platelets 150 - 400 K/uL 387   430        Latest Ref Rng & Units 03/19/2022     4:30 AM 03/18/2022    8:05 AM 03/18/2022    5:08 AM  CMP  Glucose 70 - 99 mg/dL 84  82  93   BUN 6 - 20 mg/dL <5  <3  <5   Creatinine 0.44 - 1.00 mg/dL 6.30  1.60  1.09   Sodium 135 - 145 mmol/L 140  141  137   Potassium 3.5 - 5.1 mmol/L 3.0  2.7  3.2   Chloride 98 - 111 mmol/L 104  102  103   CO2 22 - 32 mmol/L 26   21   Calcium 8.9 - 10.3 mg/dL 8.2   8.7   Total Protein 6.5 - 8.1 g/dL   7.6   Total Bilirubin 0.3 - 1.2 mg/dL   0.8   Alkaline Phos 38 - 126 U/L   57   AST 15 - 41 U/L   24   ALT 0 - 44 U/L   15     Imaging: No results found.  Assessment/Plan:   Principal Problem:   Pelvic abscess  Active Problems:   Protein-calorie malnutrition (HCC)   Hypokalemia   Patient Summary: Paula Lucas is a 62 yoF with multiple recent complicated admissions for intra-abdominal abscesses treated with drain placements and prolonged antibiotic presenting today with persistent abdominal pain, nausea, and vomiting.  Abdominal pain 2/2 hx pelvic abscesses s/p washout with JP drain placement Abdominal pain, nausea, vomiting improved today but with persistent TTP at insertion site, no erythema. HDS, normal WBC and lactate. CTAP with improved inflammation and stable fluid collections. Drains with moderate output of cloudy fluid. Discussed with OBGYN who recommends measuring output for at least 24 hours and reconsult for possible drain removal. -Continue cefadroxil and Flagyl -Monitor JP drain output for 24 hrs -Blood cxs NG by 1 day -Flush drains to maintain patency -Oxycodone 10 mg every 4 hours as needed for moderate pain -IV Dilaudid 0.5 mg every 4 hours as needed for severe pain   Electrolytes Hypomagnesemia and hypokalemia likely 2/2 vomiting and poor po intake. K 3.0 today, up from 2.7 after IV potassium yesterday. Had to run slow due to patient not tolerating discomfort. Does not tolerate po either. 4 more runs of IV K+ this morning and 2g Mag.  -Trend BMP  Protein  calorie malnutrition Poor oral intake in the setting of nausea/vomiting going on several weeks now since surgery. Required Cortrak for several days during first admission. Patient refuses hospital food. Dietitian recommended family or friends bring foods she likes.  -Encourage po intake   Anemia of inflammation Thrombocytosis Likely 2/2 persistent inflammation in the setting of multiple abdominal abscesses. Hemoglobin stable at ~10, platelets 387 (430). -Trend CBC  Diet: Regular VTE prophylaxis: Lovenox Full code   Dispo: Admit patient to inpatient with expected length of stay of at least 2 midnights.  Adron Bene MD Internal Medicine Resident PGY-1 Please contact the on call pager after 5 pm and on weekends at [phone].

## 2022-03-19 NOTE — Progress Notes (Signed)
Dsg change performed to mid abdominal incision site. Pt tolerated with no problems.

## 2022-03-19 NOTE — Hospital Course (Addendum)
Nausea and vomiting is better. Her pain is 7/10 right now. Was supposed to see OBGYN today to get drains removed.   ------------------------------------- 7/31: breathing well, no diarrhea, abdominal pain present, unsure of last bowel movement, patient is comfortable starting duloxetine for pain, patient states that her appetite has decreased,  patient does not want to try ensure for her meals, discussed plan to see if she can eat today and if not feeding tube may be necessary,  ------------------------------------------------ 8/1: Patient is complaining of 6/10 pain in her lower back. She states that her pain has improved since yesterday and her last hospitalization. She states that she slept awkwardly last night and hit her stomach on the bed rail. She is complaining of right-sided abdominal pain. Patient is also complaining of cramping pain in bilateral calves. Patient is also complaining of urinary incontinence and night sweats. Discussed with patient to continue magnesium and potassium supplementation. Patient states that she wants to go home but is not sure that her pain would be controlled or that she would be able to eat appropriately. Patient wants her pain to be controlled and to have an improved appetite prior to discharge.  She states that she has decreased appetite. Patient also states that she is becoming depressed. Discussed that duloxetine should help with her pain and depression. Patient is comfortable talking with the chaplain today.   Will look into mirtazapine. 25 mg daily (2X 12.5mg  at once).   ------------------- 8/2: Patient states that her abdominal pain has improved since yesterday. Patient states that she has been drinking but not eating much solids yet. Discussed that mirtazapine should start to stimulate her appetite soon. Patients states that she vomited yesterday evening. Patient is still having cramping in bilateral calves. Patient is complaining of left-sided chest pain  underneath her breast. EKG ordered. Will use current pain medications for management if EKG is normal. Patient did not see the chaplain yesterday.   Discontinued medications: /switched antibiotics  PE: right-sided abdominal pain with gaurding, left sided abdominal pain,   ---------------------------------- 8/3:  Decreased mirtazapine to 7.5 mg  --------------------- 8/4: Patient states that she is feeling better today but tired overall. Discussed plan to switch IV dilaudid from q4 hours to q6 hours today. Patient agrees with the plan.   PE: normal heart, lungs, abdomen,    8/6: Patient complaining of abdominal pain today. Patient notes a pruritic, erythematous rash developing on her RUE and bilateral LE that she first noticed yesterday. She denies noticing any drainage from her rash. She denies ever having a rash similar to this previously. Discussed plan to try triamcinolone. Patient is also complaining of nausea and states that her zofran is not helping.   8/7: Patient states that she has developed nausea, vomiting, and abdominal pain that began yesterday morning and worsened last night. Patient denies noticing any blood in her vomit. She states that she has decreased her eating after developing n/v. Patient also complaining of yellow diarrhea this morning. Patient states that her itching has improved after receiving benadryl and triamcinolone cream. Discussed plan to check for gonorrhea and chlamydia. Discussed plan to switch IV medications to PO tomorrow in order to transition her for discharge soon. Discussed plan to order one time dose of oral dilaudid to assess if this works well for her pain. Discussed that PT/OT is comfortable with her going home.   PE: RUE rash still present with erythema, bilateral LE rash, normal heart and lungs, abdominal tenderness, tenderness to palpation of fingers  Pt was initially admitted to the hospital on 6/19 and stayed until 7/9 for severe sepsis  and multiple pelvic abscesses. At that time, she underwent ex-lap with washout with JP Drains. She was then discharged, and readmitted on 7/22 for fever and abdominal pain, then discharged on 7/25 with cefadroxil and flagyl. She then presented again on 7/27 with worsening abdominal pain.   #Abdominal Pain w/ Hx of Pelvic Abscesses s/p washout with JP Drain Placement  JP Drains were placed and removed on 7/30. Her pain regimen has been difficult to control, as she is very specific with her medication. On this admission, initially her pain was being controlled with Oxycodone 10mg  Q4H, and IV Dilaudid .5mg  for breakthrough pain. Pt was still complaining of severe pain despite these efforts, so we added on duloxetine 30mg , and gabapentin 300 mg 3 times, and Toradol Q6H. We then switched her pain medication back to PO Oxycodone 10mg  Q4H, and Dilaudid .5mg  IV, with the intention of slowly reducing the dose of the IV Dilaudid. We gave her a trial of dilaudid instead of oxycodone, and are currently waiting to see which one is more effective for her.   Her infection was treated with IV Cefepime and Flagyl initially however she was still spiking fevers occasionally, and she was also not compliant with a lot of her medications, denying the lot of the them. In efforts to reduce the amount of Ivs/pills she was on to help increase compliance, we changed her IV Cefepime and Flagyl to Augmentin 875-125. She has not spiked a fever since. Continue Augmentin until 8/11.  #Hypokalemia/Hypomagnesemia In the Setting of Malnutrition Potassium levels of fluctuated since arrival, lowest level being 2.7 and highest being 3.5. Magnesium levels of also fluctuated, lowest level being 1.3, highest being 1.5. These are most likely in the setting of poor nutrition and lack of appetite. To correct this, we initially started with IV potassium and magnesium, however patient would decline intermittently, due to a burning feeling she would get  when potassium would enter. Dilution of potassium did not help either, as she still complained of burning.  We then discussed PO potassium and magnesium, and patient was agreeable at the time. She continued to decline medications. Feeding tube placement was on the table, however we prescribed mirtazapine 7.5mg  and her appetite started returning. We also added spironolactone to her medical regimen for the potassium sparing effect. She started eating and craving food.   She also refused to ambulate her first 4-5 days in the hospital. Physical therapy was able to motivate her enough to ambulate, and she has been getting up to use the restroom, and also taking laps around the floor with nurse and PT assistance.   #Depression/Schizoaffective Disorder/Bipolar Disorder  On August 2nd, we were able to get a more robust psychiatric history for her, which revealed she had been diagnosed previously with Major Depressive Disorder, Schizoaffective disorder and Bipolar Disorder. She had been seeing an outpatient psychiatrist at [facility], but she hasn't been on any of her medications in 2 months since she had been in and out of the hospital. She was previously on the Abilify shot 5mg , Celexa, and Seroquel. We put in a psychiatric consult and started Seroquel 50mg . Psychiatry put in the   -------------------------- 8/8: Patient states that she had 3 episodes of vomiting last night. Patient states that she had diarrhea last night and that she is still passing gas. She states that she has not eaten anything this morning. Patient is still complaining  of 8/10 right-sided abdominal pain. Patient states that the triamcinolone cream has been helping with her rash. She states that her pruritis has resolved. Discussed findings of yesterdays CT. Patient would like to go home today because it is her sisters birthday. Discussed plan to start liquid diet. Discussed plan to continue IV dilaudid today. Discussed plan to order IV toradol.  Discussed plan to contact OBGYN to review the CT and see the patient.   PE: hypoactive bowel sounds, LUQ pain, clear lungs ---------------------------- 8/9: Patient states that she is feeling better today and would like to go home. She states that she has been tolerating her liquid diet well. She denies nausea or vomiting. She states that her abdominal pain has improved. She states that she has not had a bowel movement yesterday or today but did have one 2 nights ago. Discussed plan to transition to a normal diet today. Updated patient on the results of her labs. Discussed plan to likely discharge today and have her follow up with [place] in clinic. Patient agrees with the plan.   PE: improved rash on UE/LE, normal heart/lungs

## 2022-03-20 DIAGNOSIS — E46 Unspecified protein-calorie malnutrition: Secondary | ICD-10-CM | POA: Diagnosis not present

## 2022-03-20 DIAGNOSIS — D75839 Thrombocytosis, unspecified: Secondary | ICD-10-CM

## 2022-03-20 DIAGNOSIS — E876 Hypokalemia: Secondary | ICD-10-CM

## 2022-03-20 DIAGNOSIS — N739 Female pelvic inflammatory disease, unspecified: Principal | ICD-10-CM

## 2022-03-20 DIAGNOSIS — D649 Anemia, unspecified: Secondary | ICD-10-CM

## 2022-03-20 LAB — CBC WITH DIFFERENTIAL/PLATELET
Abs Immature Granulocytes: 0.03 10*3/uL (ref 0.00–0.07)
Basophils Absolute: 0.1 10*3/uL (ref 0.0–0.1)
Basophils Relative: 1 %
Eosinophils Absolute: 0.4 10*3/uL (ref 0.0–0.5)
Eosinophils Relative: 5 %
HCT: 31.2 % — ABNORMAL LOW (ref 36.0–46.0)
Hemoglobin: 10.3 g/dL — ABNORMAL LOW (ref 12.0–15.0)
Immature Granulocytes: 0 %
Lymphocytes Relative: 48 %
Lymphs Abs: 3.7 10*3/uL (ref 0.7–4.0)
MCH: 28.3 pg (ref 26.0–34.0)
MCHC: 33 g/dL (ref 30.0–36.0)
MCV: 85.7 fL (ref 80.0–100.0)
Monocytes Absolute: 0.4 10*3/uL (ref 0.1–1.0)
Monocytes Relative: 6 %
Neutro Abs: 3 10*3/uL (ref 1.7–7.7)
Neutrophils Relative %: 40 %
Platelets: 427 10*3/uL — ABNORMAL HIGH (ref 150–400)
RBC: 3.64 MIL/uL — ABNORMAL LOW (ref 3.87–5.11)
RDW: 16.8 % — ABNORMAL HIGH (ref 11.5–15.5)
WBC: 7.6 10*3/uL (ref 4.0–10.5)
nRBC: 0 % (ref 0.0–0.2)

## 2022-03-20 LAB — BASIC METABOLIC PANEL
Anion gap: 8 (ref 5–15)
Anion gap: 6 (ref 5–15)
BUN: <5 mg/dL — ABNORMAL LOW (ref 6–20)
BUN: <5 mg/dL — ABNORMAL LOW (ref 6–20)
CO2: 29 mmol/L (ref 22–32)
CO2: 29 mmol/L (ref 22–32)
Calcium: 8 mg/dL — ABNORMAL LOW (ref 8.9–10.3)
Calcium: 8.3 mg/dL — ABNORMAL LOW (ref 8.9–10.3)
Chloride: 102 mmol/L (ref 98–111)
Chloride: 103 mmol/L (ref 98–111)
Creatinine, Ser: 0.53 mg/dL (ref 0.44–1.00)
Creatinine, Ser: 0.57 mg/dL (ref 0.44–1.00)
GFR, Estimated: >60 mL/min (ref >60)
GFR, Estimated: >60 mL/min (ref >60)
Glucose, Bld: 81 mg/dL (ref 70–99)
Glucose, Bld: 96 mg/dL (ref 70–99)
Potassium: 2.7 mmol/L — CL (ref 3.5–5.1)
Potassium: 3 mmol/L — ABNORMAL LOW (ref 3.5–5.1)
Sodium: 139 mmol/L (ref 135–145)
Sodium: 138 mmol/L (ref 135–145)

## 2022-03-20 LAB — MAGNESIUM
Magnesium: 1.3 mg/dL — ABNORMAL LOW (ref 1.7–2.4)
Magnesium: 2.1 mg/dL (ref 1.7–2.4)

## 2022-03-20 MED ORDER — POTASSIUM CHLORIDE CRYS ER 20 MEQ PO TBCR
40.0000 meq | EXTENDED_RELEASE_TABLET | Freq: Two times a day (BID) | ORAL | Status: AC
Start: 2022-03-20 — End: 2022-03-20
  Filled 2022-03-20: qty 2

## 2022-03-20 MED ORDER — POTASSIUM CHLORIDE 10 MEQ/100ML IV SOLN
10.0000 meq | INTRAVENOUS | Status: AC
  Administered 2022-03-21 (×3): 10 meq via INTRAVENOUS
  Filled 2022-03-20 (×3): qty 100

## 2022-03-20 MED ORDER — CEFEPIME HCL 2 G IV SOLR
2.0000 g | Freq: Three times a day (TID) | INTRAVENOUS | Status: DC
Start: 2022-03-20 — End: 2022-03-24
  Administered 2022-03-20 – 2022-03-24 (×12): 2 g via INTRAVENOUS
  Filled 2022-03-20 (×13): qty 12.5

## 2022-03-20 MED ORDER — MAGNESIUM SULFATE 4 GM/100ML IV SOLN
4.0000 g | Freq: Once | INTRAVENOUS | Status: DC
Start: 2022-03-20 — End: 2022-03-20

## 2022-03-20 MED ORDER — MAGNESIUM SULFATE 4 GM/100ML IV SOLN
4.0000 g | Freq: Once | INTRAVENOUS | Status: AC
Start: 2022-03-20 — End: 2022-03-20
  Administered 2022-03-20: 4 g via INTRAVENOUS
  Filled 2022-03-20: qty 100

## 2022-03-20 MED ORDER — METRONIDAZOLE 500 MG/100ML IV SOLN
500.0000 mg | Freq: Two times a day (BID) | INTRAVENOUS | Status: DC
  Administered 2022-03-20 – 2022-03-24 (×8): 500 mg via INTRAVENOUS
  Filled 2022-03-20 (×8): qty 100

## 2022-03-20 MED ORDER — MAGNESIUM SULFATE 2 GM/50ML IV SOLN
2.0000 g | Freq: Once | INTRAVENOUS | Status: AC
Start: 2022-03-20 — End: 2022-03-20
  Administered 2022-03-20: 2 g via INTRAVENOUS
  Filled 2022-03-20: qty 50

## 2022-03-20 MED ORDER — POTASSIUM CHLORIDE 10 MEQ/100ML IV SOLN
10.0000 meq | INTRAVENOUS | Status: AC
  Administered 2022-03-20 (×4): 10 meq via INTRAVENOUS
  Filled 2022-03-20 (×4): qty 100

## 2022-03-20 NOTE — Progress Notes (Signed)
Went to change dressing this morning at 0930 but patient declined at the time stating that it hurts and she wanted to wait until her next dose of IV pain medication. Offered to change dressing after IV pain medication was given at 1134, patient continues to decline at this time. Will attempt dressing change again later.

## 2022-03-20 NOTE — Progress Notes (Signed)
Patient refused her night time medication for 03/19/22,refused her dressing change,and her PO  potassium medication.

## 2022-03-20 NOTE — Progress Notes (Signed)
PHARMACY ANTIBIOTIC CONSULT NOTE   Paula Lucas a 31 y.o. female admitted on 7/27 with refractory abdominal pain. Patient noted to have a recent admission for pelvic floor/tubo-ovarian abscesses for which she had an ex lap and 2 JP drains placed. PTA patient was supposed to complete an additional 30 d of abx therapy fro ?7/25 with cefadroxil and flagyl with outpatient f/u at [facility] clinic on 8/18. During this admission, patient has been unable to tolerate oral medications. Pharmacy has been consulted for cefepime dosing.  7/29: Scr 0.53, WBC 7.6  Vital Signs stable, patient afebrile   Estimated Creatinine Clearance: 95.2 mL/min (by C-G formula based on SCr of 0.53 mg/dL).  Plan: START Cefepime 2g IV Q8H CONTINUE flagyl 500 mg IV BID  Monitor renal function, clinical status, C/S, de-escalation   Allergies:  Allergies  Allergen Reactions   Fish Allergy Anaphylaxis    Severe allergic reaction to all seafoods   Motrin [Ibuprofen] Anaphylaxis    Pt tolerated Ketorolac 04/02/18 Pt tolerated toradol during hosp admission in July 2023   Mushroom Extract Complex Anaphylaxis   Shellfish-Derived Products Anaphylaxis   Tylenol [Acetaminophen] Anaphylaxis    There were no vitals filed for this visit.     Latest Ref Rng & Units 03/20/2022    1:05 AM 03/19/2022    4:30 AM 03/18/2022    8:05 AM  CBC  WBC 4.0 - 10.5 K/uL 7.6  7.6    Hemoglobin 12.0 - 15.0 g/dL 19.1  9.9  66.0   Hematocrit 36.0 - 46.0 % 31.2  30.8  30.0   Platelets 150 - 400 K/uL 427  387      Antibiotics Given (last 72 hours)     None       Antimicrobials this admission: Cefadroxil/flagyl PTA  Cefepime/flagyl 7/29>>  Microbiology results: 7/27 Bcx: NGTD   Thank you for allowing pharmacy to be a part of this patient's care.  Jani Gravel, PharmD PGY-2 Infectious Diseases Resident  03/20/2022 11:07 AM

## 2022-03-20 NOTE — Consult Note (Addendum)
OBSTETRICS AND GYNECOLOGY ATTENDING CONSULT NOTE  Consult Date: 03/20/2022 Reason for Consult: assess for possible discontinuation of JP drains  Fevers, abdominal pain, known abdominal/pelvic collections s/p IR drainage x 2 then ELA, pelvic washout on 02/16/22  Consulting Provider: Internal Medicine Service   Assessment/Plan: CT looks improved and no obvious fluid collections that the JPs would be obviously improving. JP1 had 35mL in past 24h and JP2 had 40mL in past 24h; output wasn't initially being recorded so I asked the primary team to start recording the output. I'm not sure which is which but in the op note one went near the liver and the other in the pelvis.   If still looks good with low output tomorrow, can d/c tomorrow.  Total consultation time including face-to-face time with patient (>50% of time), reviewing chart and documentation: 35 minutes  Cornelia Copaharlie Aaliyha Mumford, Jr MD Attending [facility] GYN Consult Phone: [phone] (M-F, [phone]) & [phone] (Off hours, weekends, holidays)  History of Present Illness: Paula Lucas is an 31 y.o. [id number] female who was recently admitted for a complicated course due to multiple abdominal and pelvic collections.   Over the last month, she was admitted to ICU for sepsis, underwent attempted IR drainage of collections x 2, then had a ELAP and pelvic washout by our gynecology team and General Surgery.  She required multiple antibiotics, coordinated by ID. She had significant issues with pain control and nutrition, but was eventually able to be discharged on 03/05/22 to rehab with wound dressing and JP drains in place. She was on prolonged course of antibiotics as per ID.  Patient was seen in our office for followup on 03/11/22, there were no acute concerns then and she was also seen by ID on 03/12/22 and the plan was for 30 more days of Augmentin as prescribed.    She was admitted on 7/23 increasing  pain, abdominal distention and fevers and chills.  On evaluation, she was noted to have a temperature of 102.3, pulse of 134, respiratory rate of 31 and Code Sepsis was activated. She received IV fluids and  repeat CT scan showed persistent abdominal and pelvic abscesses, largest about 4-5 cm in size.  WBC was noted to be 11.7, increased from 7.4 at discharge, normal lactic acid at 0.7. Hemoglobin was 10.5.  She was also noted to be COVID positive and placed on droplet precautions; she has no respiratory symptoms. She was started on IV Cefepime and Metronidazole and our service was consulted.  She was discharged to home on 7/25  Most recently patient admitted on 7/27 for pain control. CT scan results below. Primary team believes pain may be mostly related to JP drains  Pertinent OB/GYN History: Patient's last menstrual period was 10/26/2021. OB History  Gravida Para Term Preterm AB Living  2 0 0 0 1 0  SAB IAB Ectopic Multiple Live Births  0 1 0 0 0    # Outcome Date GA Lbr Len/2nd Weight Sex Delivery Anes PTL Lv  2 Gravida           1 IAB           History of PID/TOA after gonorrhea and chlamydia infections.  Patient Active Problem List   Diagnosis Date Noted   Fever 03/14/2022   COVID-19 03/13/2022   Abscess of abdominal cavity (HCC)    Hypokalemia 03/05/2022   Pleural effusion 03/05/2022   Acute blood loss anemia 03/05/2022   Debility 02/28/2022   Depression  02/23/2022   Protein-calorie malnutrition (HCC)    Hx of Fitz-Hugh-Curtis syndrome    Hepatic abscess 02/15/2022   MRSA (methicillin resistant staph aureus) culture positive    Pelvic abscess      Past Medical History:  Diagnosis Date   Asthma    BMI 31.0-31.9,adult    Chlamydia    Gonorrhea    TOA (tubo-ovarian abscess)     Past Surgical History:  Procedure Laterality Date   INDUCED ABORTION     IR RADIOLOGIST EVAL & MGMT  05/17/2018   LAPAROTOMY N/A 02/16/2022   Procedure: EXPLORATORY LAPAROTOMY WITH ABDOMINAL  WASHOUT;  Surgeon: Reva Bores, MD;  Location: [facility] OR;  Service: Gynecology;  Laterality: N/A;   SMALL BOWEL REPAIR N/A 02/16/2022   Procedure: SMALL BOWEL REPAIR;  Surgeon: Violeta Gelinas, MD;  Location: [facility] OR;  Service: General;  Laterality: N/A;   TONSILLECTOMY      Family History  Problem Relation Age of Onset   Diabetes Mother    Hypertension Mother    Diabetes Father    Hypertension Father    Diabetes Sister    Hypertension Sister     Social History:  reports that she has quit smoking. Her smoking use included cigarettes. She has a 4.50 pack-year smoking history. She has never used smokeless tobacco. She reports that she does not currently use drugs after having used the following drugs: Marijuana. She reports that she does not drink alcohol.  Allergies:  Allergies  Allergen Reactions   Fish Allergy Anaphylaxis    Severe allergic reaction to all seafoods   Motrin [Ibuprofen] Anaphylaxis    Pt tolerated Ketorolac 04/02/18 Pt tolerated toradol during hosp admission in July 2023   Mushroom Extract Complex Anaphylaxis   Shellfish-Derived Products Anaphylaxis   Tylenol [Acetaminophen] Anaphylaxis    Medications: I have reviewed the patient's current medications. Prior to Admission:  Medications Prior to Admission  Medication Sig Dispense Refill Last Dose   cyclobenzaprine (FLEXERIL) 5 MG tablet Take 1 tablet (5 mg total) by mouth 3 (three) times daily as needed for muscle spasms. 45 tablet 0 03/17/2022   furosemide (LASIX) 40 MG tablet Take 1 tablet (40 mg total) by mouth daily. 14 tablet 0 03/17/2022   gabapentin (NEURONTIN) 300 MG capsule Take 1 capsule (300 mg total) by mouth 3 (three) times daily. 30 capsule 1 03/17/2022   hydrOXYzine (ATARAX) 10 MG tablet Take 1 tablet (10 mg total) by mouth 3 (three) times daily as needed for anxiety. 30 tablet 0 03/17/2022   metroNIDAZOLE (FLAGYL) 500 MG tablet Take 1 tablet (500 mg total) by mouth 2 (two) times daily. 60 tablet 0  03/17/2022   naloxone (NARCAN) nasal spray 4 mg/0.1 mL Use 1 spray in the nose as needed. 2 each 0  at unk   oxyCODONE (ROXICODONE) 5 MG immediate release tablet Take 1 tablet (5 mg total) by mouth every 4 (four) hours as needed for up to 5 days for severe pain. 30 tablet 0 03/17/2022   pantoprazole (PROTONIX) 40 MG tablet Take 1 tablet (40 mg total) by mouth daily. 30 tablet 0 03/17/2022   potassium chloride (KLOR-CON M) 10 MEQ tablet Take 2 tablets (20 mEq total) by mouth 2 (two) times daily. 28 tablet 0 03/17/2022   prochlorperazine (COMPAZINE) 10 MG tablet Take 1 tablet (10 mg total) by mouth every 6 (six) hours as needed for nausea or vomiting. 45 tablet 0 03/17/2022   traZODone (DESYREL) 50 MG tablet Take 1 tablet (50 mg  total) by mouth at bedtime. 15 tablet 0 03/17/2022   valACYclovir (VALTREX) 1000 MG tablet Take 1 tablet (1,000 mg total) by mouth 2 (two) times daily. 30 tablet 1 03/17/2022    Review of Systems: Pertinent items noted in HPI and remainder of comprehensive ROS otherwise negative.  Focused Physical Examination: BP 133/87 (BP Location: Right Arm)   Pulse 73   Temp 98.5 F (36.9 C) (Oral)   Resp 18   LMP 10/26/2021   SpO2 97%  CONSTITUTIONAL: Well-developed, well-nourished female in no acute distress.  NEUROLOGIC: grossly normal PSYCHIATRIC: Normal mood and affect. Normal behavior. Normal judgment and thought content. CARDIOVASCULAR: Normal heart rate noted, regular rhythm RESPIRATORY: Clear to auscultation bilaterally. Effort and breath sounds normal, no problems with respiration noted. ABDOMEN: vertical midline incision with gauze packing in place. JP drains 1 and 2 with about 61mL yellow, purulent discharge in them. Pt moderately ttp with manipulation with them 2 worse than 1 in terms of tenderness. Drain sites with bandages and tegarderms in place but look fine.  PELVIC: Deferred MUSCULOSKELETAL: No cyanosis, clubbing, or edema.  2+ distal pulses.  Labs and  Imaging: Results for orders placed or performed during the hospital encounter of 03/18/22 (from the past 72 hour(s))  Lactic acid, plasma     Status: None   Collection Time: 03/18/22  5:08 AM  Result Value Ref Range   Lactic Acid, Venous 0.8 0.5 - 1.9 mmol/L    Comment: Performed at [facility], [address] [place], [place] [address]  Comprehensive metabolic panel     Status: Abnormal   Collection Time: 03/18/22  5:08 AM  Result Value Ref Range   Sodium 137 135 - 145 mmol/L   Potassium 3.2 (L) 3.5 - 5.1 mmol/L   Chloride 103 98 - 111 mmol/L   CO2 21 (L) 22 - 32 mmol/L   Glucose, Bld 93 70 - 99 mg/dL    Comment: Glucose reference range applies only to samples taken after fasting for at least 8 hours.   BUN <5 (L) 6 - 20 mg/dL   Creatinine, Ser 4.17 0.44 - 1.00 mg/dL   Calcium 8.7 (L) 8.9 - 10.3 mg/dL   Total Protein 7.6 6.5 - 8.1 g/dL   Albumin 2.7 (L) 3.5 - 5.0 g/dL   AST 24 15 - 41 U/L   ALT 15 0 - 44 U/L   Alkaline Phosphatase 57 38 - 126 U/L   Total Bilirubin 0.8 0.3 - 1.2 mg/dL   GFR, Estimated >40 >81 mL/min    Comment: (NOTE) Calculated using the CKD-EPI Creatinine Equation (2021)    Anion gap 13 5 - 15    Comment: Performed at [facility], [address], [place], [place] [address]  CBC with Differential     Status: Abnormal   Collection Time: 03/18/22  5:08 AM  Result Value Ref Range   WBC 10.6 (H) 4.0 - 10.5 K/uL   RBC 3.56 (L) 3.87 - 5.11 MIL/uL   Hemoglobin 10.1 (L) 12.0 - 15.0 g/dL   HCT 56.3 (L) 14.9 - 70.2 %   MCV 89.9 80.0 - 100.0 fL   MCH 28.4 26.0 - 34.0 pg   MCHC 31.6 30.0 - 36.0 g/dL   RDW 63.7 (H) 85.8 - 85.0 %   Platelets 430 (H) 150 - 400 K/uL   nRBC 0.0 0.0 - 0.2 %   Neutrophils Relative % 50 %   Neutro Abs 5.3 1.7 - 7.7 K/uL  Lymphocytes Relative 40 %   Lymphs Abs 4.2 (H) 0.7 - 4.0 K/uL   Monocytes Relative 5 %   Monocytes Absolute 0.5 0.1 - 1.0 K/uL   Eosinophils Relative 3 %   Eosinophils Absolute 0.3 0.0 - 0.5 K/uL    Basophils Relative 2 %   Basophils Absolute 0.2 (H) 0.0 - 0.1 K/uL   nRBC 0 0 /100 WBC   Abs Immature Granulocytes 0.00 0.00 - 0.07 K/uL    Comment: Performed at [facility], [address] [place], [place] [address]  Lipase, blood     Status: None   Collection Time: 03/18/22  5:08 AM  Result Value Ref Range   Lipase 29 11 - 51 U/L    Comment: Performed at [facility], [address] [place], [place] [address]  Magnesium     Status: Abnormal   Collection Time: 03/18/22  5:08 AM  Result Value Ref Range   Magnesium 1.3 (L) 1.7 - 2.4 mg/dL    Comment: Performed at [facility], [address] [place], [place] [address]  I-Stat Beta hCG blood, ED ([facility], [facility], [facility] only)     Status: None   Collection Time: 03/18/22  5:52 AM  Result Value Ref Range   I-stat hCG, quantitative <5.0 <5 mIU/mL   Comment 3            Comment:   GEST. AGE      CONC.  (mIU/mL)   <=1 WEEK        5 - 50     2 WEEKS       50 - 500     3 WEEKS       100 - 10,000     4 WEEKS     1,000 - 30,000        FEMALE AND NON-PREGNANT FEMALE:     LESS THAN 5 mIU/mL   Blood culture (routine x 2)     Status: None (Preliminary result)   Collection Time: 03/18/22  5:58 AM   Specimen: BLOOD  Result Value Ref Range   Specimen Description BLOOD RIGHT ANTECUBITAL    Special Requests      BOTTLES DRAWN AEROBIC AND ANAEROBIC Blood Culture adequate volume   Culture      NO GROWTH 2 DAYS Performed at [facility], [address] [place], [place] [address]    Report Status PENDING   Blood culture (routine x 2)     Status: None (Preliminary result)   Collection Time: 03/18/22  5:58 AM   Specimen: BLOOD RIGHT HAND  Result Value Ref Range   Specimen Description BLOOD RIGHT HAND    Special Requests AEROBIC BOTTLE ONLY Blood Culture adequate volume    Culture      NO GROWTH 2 DAYS Performed at [facility], [address], [place], [place] [address]    Report Status PENDING   I-stat chem 8, ED (not  at [facility] or [facility])     Status: Abnormal   Collection Time: 03/18/22  8:05 AM  Result Value Ref Range   Sodium 141 135 - 145 mmol/L   Potassium 2.7 (LL) 3.5 - 5.1 mmol/L   Chloride 102 98 - 111 mmol/L   BUN <3 (L) 6 - 20 mg/dL   Creatinine, Ser 2.70 (L) 0.44 - 1.00 mg/dL   Glucose, Bld 82 70 - 99 mg/dL    Comment: Glucose reference range applies only to samples taken  after fasting for at least 8 hours.   Calcium, Ion 1.10 (L) 1.15 - 1.40 mmol/L   TCO2 22 22 - 32 mmol/L   Hemoglobin 10.2 (L) 12.0 - 15.0 g/dL   HCT 51.0 (L) 25.8 - 52.7 %   Comment NOTIFIED PHYSICIAN   CBC with Differential/Platelet     Status: Abnormal   Collection Time: 03/19/22  4:30 AM  Result Value Ref Range   WBC 7.6 4.0 - 10.5 K/uL   RBC 3.51 (L) 3.87 - 5.11 MIL/uL   Hemoglobin 9.9 (L) 12.0 - 15.0 g/dL   HCT 78.2 (L) 42.3 - 53.6 %   MCV 87.7 80.0 - 100.0 fL   MCH 28.2 26.0 - 34.0 pg   MCHC 32.1 30.0 - 36.0 g/dL   RDW 14.4 (H) 31.5 - 40.0 %   Platelets 387 150 - 400 K/uL   nRBC 0.0 0.0 - 0.2 %   Neutrophils Relative % 38 %   Neutro Abs 2.9 1.7 - 7.7 K/uL   Lymphocytes Relative 48 %   Lymphs Abs 3.6 0.7 - 4.0 K/uL   Monocytes Relative 7 %   Monocytes Absolute 0.5 0.1 - 1.0 K/uL   Eosinophils Relative 7 %   Eosinophils Absolute 0.5 0.0 - 0.5 K/uL   Basophils Relative 0 %   Basophils Absolute 0.0 0.0 - 0.1 K/uL   nRBC 0 0 /100 WBC   Abs Immature Granulocytes 0.00 0.00 - 0.07 K/uL    Comment: Performed at [facility], [address] [place], [place] [address]  Basic metabolic panel     Status: Abnormal   Collection Time: 03/19/22  4:30 AM  Result Value Ref Range   Sodium 140 135 - 145 mmol/L   Potassium 3.0 (L) 3.5 - 5.1 mmol/L   Chloride 104 98 - 111 mmol/L   CO2 26 22 - 32 mmol/L   Glucose, Bld 84 70 - 99 mg/dL    Comment: Glucose reference range applies only to samples taken after fasting for at least 8 hours.   BUN <5 (L) 6 - 20 mg/dL   Creatinine, Ser 9.50 0.44 - 1.00 mg/dL   Calcium 8.2  (L) 8.9 - 10.3 mg/dL   GFR, Estimated >93 >26 mL/min    Comment: (NOTE) Calculated using the CKD-EPI Creatinine Equation (2021)    Anion gap 10 5 - 15    Comment: Performed at [facility], [address], [place], [place] [address]  CBC with Differential/Platelet     Status: Abnormal   Collection Time: 03/20/22  1:05 AM  Result Value Ref Range   WBC 7.6 4.0 - 10.5 K/uL   RBC 3.64 (L) 3.87 - 5.11 MIL/uL   Hemoglobin 10.3 (L) 12.0 - 15.0 g/dL   HCT 80.9 (L) 98.3 - 38.2 %   MCV 85.7 80.0 - 100.0 fL   MCH 28.3 26.0 - 34.0 pg   MCHC 33.0 30.0 - 36.0 g/dL   RDW 50.5 (H) 39.7 - 67.3 %   Platelets 427 (H) 150 - 400 K/uL   nRBC 0.0 0.0 - 0.2 %   Neutrophils Relative % 40 %   Neutro Abs 3.0 1.7 - 7.7 K/uL   Lymphocytes Relative 48 %   Lymphs Abs 3.7 0.7 - 4.0 K/uL   Monocytes Relative 6 %   Monocytes Absolute 0.4 0.1 - 1.0 K/uL   Eosinophils Relative 5 %   Eosinophils Absolute 0.4 0.0 - 0.5 K/uL   Basophils Relative 1 %  Basophils Absolute 0.1 0.0 - 0.1 K/uL   Immature Granulocytes 0 %   Abs Immature Granulocytes 0.03 0.00 - 0.07 K/uL    Comment: Performed at [facility], [address] [place], [place] [address]  Basic metabolic panel     Status: Abnormal   Collection Time: 03/20/22  1:05 AM  Result Value Ref Range   Sodium 139 135 - 145 mmol/L   Potassium 2.7 (LL) 3.5 - 5.1 mmol/L    Comment: CRITICAL RESULT CALLED TO, READ BACK BY AND VERIFIED WITH johnson n,rn 03/20/22 0215 wayk   Chloride 102 98 - 111 mmol/L   CO2 29 22 - 32 mmol/L   Glucose, Bld 81 70 - 99 mg/dL    Comment: Glucose reference range applies only to samples taken after fasting for at least 8 hours.   BUN <5 (L) 6 - 20 mg/dL   Creatinine, Ser 9.14 0.44 - 1.00 mg/dL   Calcium 8.0 (L) 8.9 - 10.3 mg/dL   GFR, Estimated >78 >29 mL/min    Comment: (NOTE) Calculated using the CKD-EPI Creatinine Equation (2021)    Anion gap 8 5 - 15    Comment: Performed at [facility], [address],  [place], [place] [address]  Magnesium     Status: Abnormal   Collection Time: 03/20/22  1:05 AM  Result Value Ref Range   Magnesium 1.3 (L) 1.7 - 2.4 mg/dL    Comment: Performed at [facility], [address], [place], [place] [address]    No results found.  Narrative & Impression  CLINICAL DATA:  Postop abdominal pain   EXAM: CT ABDOMEN AND PELVIS WITH CONTRAST   TECHNIQUE: Multidetector CT imaging of the abdomen and pelvis was performed using the standard protocol following bolus administration of intravenous contrast.   RADIATION DOSE REDUCTION: This exam was performed according to the departmental dose-optimization program which includes automated exposure control, adjustment of the mA and/or kV according to patient size and/or use of iterative reconstruction technique.   CONTRAST:  OMNIPAQUE IOHEXOL 300 MG/ML  SOLN   COMPARISON:  CT abdomen and pelvis 03/13/2022   FINDINGS: Lower chest: Small right-greater-than-left pleural effusions with associated atelectasis/consolidation unchanged from 03/13/2022. Normal heart size. No pericardial effusion.   Hepatobiliary: Cholelithiasis. No evidence of cholecystitis. No focal hepatic lesion. Unchanged mild intrahepatic biliary ductal dilation.   Pancreas: Unremarkable. No pancreatic ductal dilatation or surrounding inflammatory changes.   Spleen: Normal in size without focal abnormality.   Adrenals/Urinary Tract: Adrenal glands are unremarkable. Kidneys are normal, without renal calculi, focal lesion, or hydronephrosis. Bladder is unremarkable.   Stomach/Bowel: Stomach is within normal limits. The appendix is not confidently visualized. Normal caliber large and small bowel. Slight decrease in inflammatory fat stranding surrounds loops of small bowel and colon in the pelvis and anterior abdomen. The small bowel in this area demonstrates wall thickening and hyperenhancement compatible with inflammation.    Vascular/Lymphatic: Normal caliber abdominal aorta. Prominent retroperitoneal and pelvic lymph nodes are unchanged and likely reactive.   Reproductive: The uterus is unremarkable. Visualization of the ovaries is difficult due to extensive surrounding inflammatory change.   Other: Unchanged course and positioning of 2 percutaneous abdominal drainage catheter is coursing through the right ventral abdominal wall. Slight decrease in inflammatory change along the intraperitoneal course of the drains with residual marked mesenteric and peritoneal inflammation/edema. No definite fluid collection is associated with the inflammatory change along the drainage catheters.   The peripherally enhancing fluid  collection within the left pelvis is not significantly changed in size measuring approximally 3.4 x 3.1 x 3.1 cm. Additional smaller fluid collection in the pelvis adjacent to the rectum (series 3/image 75) measures 1.4 cm is also unchanged. No change in size of the 2.6 cm fluid collection adjacent to the inferior right hepatic lobe (3/40).   Small amount of free fluid in the pelvis. No free intraperitoneal air.   Musculoskeletal: Unchanged inflammatory fat stranding along the midline abdominal incision. No acute or significant osseous findings.   IMPRESSION: 1. Interval slight decrease in diffuse mesenteric and peritoneal inflammation/edema. The inflammatory change is centered around the percutaneous right ventral abdominal wall drainage catheters which are unchanged in position. 2. Similar inflammation of a loop of small bowel within the anterior abdomen and pelvis along the inflammatory change. 3. Stable size of the fluid collections within the pelvis and inferior to the right hepatic lobe. 4. Unchanged small bilateral pleural effusions and associated atelectasis/consolidation.     Electronically Signed   By: Minerva Fester M.D.   On: 03/18/2022 10:36

## 2022-03-20 NOTE — Progress Notes (Signed)
HD#1 Subjective:   Summary: [name] is a 30 yoF with multiple recent complicated admissions for intra-abdominal abscesses treated with drain placements and prolonged antibiotic presenting today with persistent abdominal pain, nausea, and vomiting.    Overnight Events: NAEON.  She is doing okay today, abdominal pain is improved. Drain sites are main location of pain. She has not been able to eat anything but has been able to drink cranberry juice. She would like to try some non-greek yogurt today. She is okay with feeding tube placement if needed. She does not like hospital food, but is unable to have someone bring food from home.  Objective:  Vital signs in last 24 hours: Vitals:   03/19/22 1716 03/19/22 2053 03/20/22 0533 03/20/22 0915  BP: 119/85 118/80 (!) 133/92 133/87  Pulse: 84 92 69 73  Resp: 17 20 19 18   Temp: 98.3 F (36.8 C) 98.2 F (36.8 C) 98.6 F (37 C) 98.5 F (36.9 C)  TempSrc: Oral Oral Oral Oral  SpO2: 96% 94% 96% 97%   Supplemental O2: Room Air SpO2: 97 %   Physical Exam:  General: young female, laying in bed, NAD. CV: normal rate and regular rhythm, no m/r/g. Pulm: normal WOB on RA. Abdomen: mildly distended, TTP at JP drain sites. JP drain sites with slightly cloudy drainage. Neuro: AAOx3, no focal deficits. Skin: warm and dry. Psych: normal mood and affect.    Intake/Output Summary (Last 24 hours) at 03/20/2022 1100 Last data filed at 03/20/2022 1030 Gross per 24 hour  Intake 480 ml  Output 1620 ml  Net -1140 ml   Net IO Since Admission: -1,649.85 mL [03/20/22 1100]  Pertinent Labs:    Latest Ref Rng & Units 03/20/2022    1:05 AM 03/19/2022    4:30 AM 03/18/2022    8:05 AM  CBC  WBC 4.0 - 10.5 K/uL 7.6  7.6    Hemoglobin 12.0 - 15.0 g/dL 03/20/2022  9.9  05.3   Hematocrit 36.0 - 46.0 % 31.2  30.8  30.0   Platelets 150 - 400 K/uL 427  387         Latest Ref Rng & Units 03/20/2022    1:05 AM 03/19/2022    4:30 AM 03/18/2022     8:05 AM  CMP  Glucose 70 - 99 mg/dL 81  84  82   BUN 6 - 20 mg/dL <5  <5  <3   Creatinine 0.44 - 1.00 mg/dL 03/20/2022  7.34  1.93   Sodium 135 - 145 mmol/L 139  140  141   Potassium 3.5 - 5.1 mmol/L 2.7  3.0  2.7   Chloride 98 - 111 mmol/L 102  104  102   CO2 22 - 32 mmol/L 29  26    Calcium 8.9 - 10.3 mg/dL 8.0  8.2      Imaging: No results found.  Assessment/Plan:   Principal Problem:   Pelvic abscess  Active Problems:   Hypokalemia   Patient Summary: [name] is a 31 yoF with multiple recent complicated admissions for intra-abdominal abscesses treated with drain placements and prolonged antibiotic presenting today with persistent abdominal pain, nausea, and vomiting.     Abdominal pain 2/2 hx pelvic abscesses s/p washout with JP drain placement Abdomen pain is somewhat improved today. No N/V currently but is having some nausea with smells and foods. Continuing to have pain with palpation around JP drain sites. <30cc of drain output altogether. Consulted OBGYN, will come  evaluate today to see if JP drains can be removed. Will transition antibiotics to IV as patient unable to tolerate PO. -changed to IV cefepime and flagyl -Blood cx NG 2 days -pain control with oxycodone 10mg  q4 prn and dilaudid 0.5mg  q4 prn for breakthrough -appreciate OBGYN assistance  Hypokalemia Hypomagnesemia Likely 2/2 vomiting and poor po intake. K 2.7 today despite IV potassium repletion yesterday. Mg 1.3 today, unchanged despite repletion yesterday. Will need to provide slow repletion to avoid discomfort and continue with IV repletion as patient unable tolerate PO. - IV potassium - IV magnesium - recheck BMP and Mg at 1800, again tomorrow AM   Protein calorie malnutrition Poor oral intake in the setting of nausea/vomiting going on several weeks now since surgery. Required Cortrak for several days during first admission. Patient refuses hospital food. Dietitian recommended family or friends  bring foods she likes, but family unable to do so. Will encourage PO intake as much as possible, but if no improvement, likely will need cortrak placement Monday for nutrition which she is amenable to.  -Encourage po intake -cortrak Monday if no improvement in nutrition   Anemia of inflammation Thrombocytosis Likely 2/2 persistent inflammation in the setting of multiple abdominal abscesses. Hemoglobin stable at ~10, platelets 387 (430). -Trend CBC  Diet: Regular VTE prophylaxis: Lovenox Full code   Dispo: Admit patient to inpatient with expected length of stay of at least 2 midnights.  Wednesday, MD Internal Medicine Resident PGY-3 Please contact the on call pager after 5 pm and on weekends at [phone].

## 2022-03-21 DIAGNOSIS — N739 Female pelvic inflammatory disease, unspecified: Secondary | ICD-10-CM | POA: Diagnosis not present

## 2022-03-21 LAB — BASIC METABOLIC PANEL
Anion gap: 7 (ref 5–15)
BUN: <5 mg/dL — ABNORMAL LOW (ref 6–20)
CO2: 27 mmol/L (ref 22–32)
Calcium: 8.5 mg/dL — ABNORMAL LOW (ref 8.9–10.3)
Chloride: 103 mmol/L (ref 98–111)
Creatinine, Ser: 0.61 mg/dL (ref 0.44–1.00)
GFR, Estimated: >60 mL/min (ref >60)
Glucose, Bld: 85 mg/dL (ref 70–99)
Potassium: 3.3 mmol/L — ABNORMAL LOW (ref 3.5–5.1)
Sodium: 137 mmol/L (ref 135–145)

## 2022-03-21 LAB — MAGNESIUM: Magnesium: 1.7 mg/dL (ref 1.7–2.4)

## 2022-03-21 MED ORDER — POTASSIUM CHLORIDE 10 MEQ/100ML IV SOLN: 10.0000 meq | INTRAVENOUS | Status: DC

## 2022-03-21 MED ORDER — HYDROMORPHONE HCL 1 MG/ML IJ SOLN
0.5000 mg | INTRAMUSCULAR | Status: DC | PRN
  Administered 2022-03-21 – 2022-03-22 (×6): 0.5 mg via INTRAVENOUS
  Filled 2022-03-21 (×8): qty 0.5

## 2022-03-21 MED ORDER — MAGNESIUM SULFATE IN D5W 1-5 GM/100ML-% IV SOLN
1.0000 g | Freq: Once | INTRAVENOUS | Status: AC
  Administered 2022-03-21: 1 g via INTRAVENOUS
  Filled 2022-03-21: qty 100

## 2022-03-21 MED ORDER — HYDROMORPHONE HCL 1 MG/ML IJ SOLN: 0.5000 mg | Freq: Four times a day (QID) | INTRAMUSCULAR | Status: DC | PRN

## 2022-03-21 MED ORDER — MAGNESIUM SULFATE IN D5W 1-5 GM/100ML-% IV SOLN
1.0000 g | Freq: Once | INTRAVENOUS | Status: AC
Start: 2022-03-21 — End: 2022-03-21
  Administered 2022-03-21: 1 g via INTRAVENOUS
  Filled 2022-03-21: qty 100

## 2022-03-21 MED ORDER — HYDROMORPHONE HCL 1 MG/ML IJ SOLN
0.5000 mg | Freq: Once | INTRAMUSCULAR | Status: AC
  Administered 2022-03-21: 0.5 mg via INTRAVENOUS

## 2022-03-21 MED ORDER — HYDROMORPHONE HCL 1 MG/ML IJ SOLN
0.5000 mg | Freq: Once | INTRAMUSCULAR | Status: AC
  Administered 2022-03-21: 0.5 mg via INTRAVENOUS
  Filled 2022-03-21: qty 0.5

## 2022-03-21 MED ORDER — POTASSIUM CHLORIDE 10 MEQ/100ML IV SOLN
10.0000 meq | Freq: Once | INTRAVENOUS | Status: AC
Start: 2022-03-21 — End: 2022-03-21
  Administered 2022-03-21: 10 meq via INTRAVENOUS
  Filled 2022-03-21: qty 100

## 2022-03-21 MED ORDER — POTASSIUM CHLORIDE CRYS ER 20 MEQ PO TBCR
40.0000 meq | EXTENDED_RELEASE_TABLET | Freq: Two times a day (BID) | ORAL | Status: DC
Start: 2022-03-21 — End: 2022-03-21

## 2022-03-21 MED ORDER — POTASSIUM CHLORIDE 20 MEQ PO PACK
40.0000 meq | PACK | Freq: Once | ORAL | Status: DC
Start: 2022-03-21 — End: 2022-03-21

## 2022-03-21 MED ORDER — KETOROLAC TROMETHAMINE 30 MG/ML IJ SOLN
30.0000 mg | Freq: Once | INTRAMUSCULAR | Status: AC
  Administered 2022-03-21: 30 mg via INTRAVENOUS
  Filled 2022-03-21: qty 1

## 2022-03-21 MED ORDER — FENTANYL CITRATE PF 50 MCG/ML IJ SOSY
50.0000 ug | PREFILLED_SYRINGE | Freq: Once | INTRAMUSCULAR | Status: AC
  Administered 2022-03-21: 50 ug via INTRAVENOUS
  Filled 2022-03-21: qty 1

## 2022-03-21 NOTE — Progress Notes (Signed)
Patient refuse her potassium iv,and her Cefepime saying its burns and trying to pull her IV out,patient was rude, and non-compliant will not take her PO medications,and using the F word [place] MD informed.

## 2022-03-21 NOTE — Progress Notes (Signed)
Gynecology Consult Progress Note  Admission Date: 03/18/2022 Current Date: 03/21/2022 10:53 AM  Paula Lucas is a 31 y.o. G2P0010 admitted for pain control. Patient status post ex-lap/washout and drain placement on 6/27    History complicated by: Patient Active Problem List   Diagnosis Date Noted   Fever 03/14/2022   COVID-19 03/13/2022   Abscess of abdominal cavity (HCC)    Hypokalemia 03/05/2022   Pleural effusion 03/05/2022   Acute blood loss anemia 03/05/2022   Debility 02/28/2022   Depression 02/23/2022   Protein-calorie malnutrition (HCC)    Hx of Fitz-Hugh-Curtis syndrome    Hepatic abscess 02/15/2022   MRSA (methicillin resistant staph aureus) culture positive    Pelvic abscess      ROS and patient/family/surgical history, located on admission H&P note dated 03/18/2022, have been reviewed, and there are no changes except as noted below Yesterday/Overnight Events:  none  Subjective:  Pain stable  Objective:    Current Vital Signs 24h Vital Sign Ranges  T 98.7 F (37.1 C) Temp  Avg: 98.7 F (37.1 C)  Min: 98.4 F (36.9 C)  Max: 99.4 F (37.4 C)  BP (!) 126/95 BP  Min: 126/95  Max: 131/92  HR 80 Pulse  Avg: 84  Min: 75  Max: 96  RR 19 Resp  Avg: 18  Min: 17  Max: 19  SaO2 97 % Room Air SpO2  Avg: 96.5 %  Min: 94 %  Max: 98 %       24 Hour I/O Current Shift I/O  Time Ins Outs 07/29 0701 - 07/30 0700 In: 1320 [P.O.:1320] Out: 1607 [Urine:1600; Drains:7] No intake/output data recorded.   Drain output <110mL each  Physical exam: General appearance: alert, cooperative, and appears stated age Abdomen: soft, mildly distended, +BS, c/d/I ABD over vertical midline incision, minimally ttp. JP drains with minimal yellow purulent discharge in the line and none in the bulbs. Suction removed, stitches cut and both drains removed. Drain sites with no e/o infection.  GU: No gross VB Extremities: no lower extremity edema Skin: warm and dry Psych:  appropriate Neurologic: Grossly normal  Medications Current Facility-Administered Medications  Medication Dose Route Frequency Provider Last Rate Last Admin   ceFEPIme (MAXIPIME) 2 g in sodium chloride 0.9 % 100 mL IVPB  2 g Intravenous Q8H Reymundo Poll, MD 200 mL/hr at 03/20/22 2101 2 g at 03/20/22 2101   cyclobenzaprine (FLEXERIL) tablet 5 mg  5 mg Oral TID PRN Rehman, Areeg N, DO   5 mg at 03/18/22 1353   enoxaparin (LOVENOX) injection 40 mg  40 mg Subcutaneous Q24H Rehman, Areeg N, DO   40 mg at 03/19/22 1324   gabapentin (NEURONTIN) capsule 300 mg  300 mg Oral TID Rehman, Areeg N, DO   300 mg at 03/20/22 1548   HYDROmorphone (DILAUDID) injection 0.5 mg  0.5 mg Intravenous Q4H PRN Merrilyn Puma, MD   0.5 mg at 03/21/22 1049   hydrOXYzine (ATARAX) tablet 10 mg  10 mg Oral TID PRN Rehman, Areeg N, DO       metroNIDAZOLE (FLAGYL) IVPB 500 mg  500 mg Intravenous Q12H Merrilyn Puma, MD 100 mL/hr at 03/20/22 2335 500 mg at 03/20/22 2335   ondansetron (ZOFRAN) tablet 4 mg  4 mg Oral Q6H PRN Rehman, Areeg N, DO       Or   ondansetron (ZOFRAN) injection 4 mg  4 mg Intravenous Q6H PRN Rehman, Areeg N, DO   4 mg at 03/20/22 0933   oxyCODONE (  Oxy IR/ROXICODONE) immediate release tablet 10 mg  10 mg Oral Q4H PRN Rehman, Areeg N, DO   10 mg at 03/20/22 1549   pantoprazole (PROTONIX) EC tablet 40 mg  40 mg Oral Daily Rehman, Areeg N, DO   40 mg at 03/18/22 1353   polyethylene glycol (MIRALAX / GLYCOLAX) packet 17 g  17 g Oral Daily PRN Rehman, Areeg N, DO       potassium chloride SA (KLOR-CON M) CR tablet 40 mEq  40 mEq Oral BID Willette Cluster, MD       traZODone (DESYREL) tablet 50 mg  50 mg Oral QHS Rehman, Areeg N, DO       valACYclovir (VALTREX) tablet 1,000 mg  1,000 mg Oral BID Jaci Standard, DO          Labs  Recent Labs  Lab 03/18/22 0508 03/18/22 0805 03/19/22 0430 03/20/22 0105  WBC 10.6*  --  7.6 7.6  HGB 10.1* 10.2* 9.9* 10.3*  HCT 32.0* 30.0* 30.8* 31.2*  PLT 430*  --   387 427*    Recent Labs  Lab 03/15/22 0722 03/16/22 0330 03/18/22 0508 03/18/22 0805 03/20/22 0105 03/20/22 1838 03/21/22 0136  NA 141   < > 137   < > 139 138 137  K 2.9*   < > 3.2*   < > 2.7* 3.0* 3.3*  CL 108   < > 103   < > 102 103 103  CO2 27   < > 21*   < > 29 29 27   BUN <5*   < > <5*   < > <5* <5* <5*  CREATININE 0.57   < > 0.65   < > 0.53 0.57 0.61  CALCIUM 8.3*   < > 8.7*   < > 8.0* 8.3* 8.5*  PROT 6.0*  --  7.6  --   --   --   --   BILITOT 0.3  --  0.8  --   --   --   --   ALKPHOS 51  --  57  --   --   --   --   ALT 19  --  15  --   --   --   --   AST 21  --  24  --   --   --   --   GLUCOSE 107*   < > 93   < > 81 96 85   < > = values in this interval not displayed.    Radiology No new imaging  Assessment & Plan:  JP drains removed x 2. Will place gauze bandages over, may need to pack with wet to dry but patient too uncomfortable with removal that I deferred this. I told her that I expect there to be some yellowish d/c still from the drain sites  Will follow up tomorrow  Code Status: Full Code  Total time taking care of the patient was 20 minutes, with greater than 50% of the time spent in face to face interaction with the patient.  MD Attending [facility] GYN Consult Phone: [phone] (M-F, 0800-1700) & [phone] (Off hours, weekends, holidays)

## 2022-03-21 NOTE — Progress Notes (Addendum)
HD#2 Subjective:   Summary: [name] is a 48 yoF with multiple recent complicated admissions for intra-abdominal abscesses treated with drain placements and prolonged antibiotic presenting today with persistent abdominal pain, nausea, and vomiting.    Overnight Events: NAEO. Patient refusing po medications.  Patient reports abdominal pain is a little improved today. Rates it 8/10. Has not been able to eat much - just cranberry juice and a little fruit. Says she cannot tolerate po potassium and IV potassium burns - agrees to receive it if run slow and diluted.  Objective:  Vital signs in last 24 hours: Vitals:   03/20/22 1700 03/20/22 2103 03/21/22 0612 03/21/22 0910  BP: (!) 130/91 (!) 127/94 (!) 131/92 (!) 126/95  Pulse: 75 85 96 80  Resp: 17 18 19    Temp: 98.4 F (36.9 C) 98.4 F (36.9 C) 99.4 F (37.4 C) 98.7 F (37.1 C)  TempSrc: Oral Oral Oral Oral  SpO2: 98% 94% 97% 97%   Supplemental O2: Room Air SpO2: 97 %   Physical Exam:  General: young female, laying in bed, NAD. CV: normal rate and regular rhythm, no m/r/g. Pulm: normal WOB on RA. Abdomen: mildly distended, TTP at JP drain sites. JP drain sites with minimal drainage. Neuro: AAOx3, no focal deficits. Skin: warm and dry. Psych: normal mood and affect.    Intake/Output Summary (Last 24 hours) at 03/21/2022 0915 Last data filed at 03/21/2022 0600 Gross per 24 hour  Intake 1080 ml  Output 1607 ml  Net -527 ml   Net IO Since Admission: -2,416.85 mL [03/21/22 0915]  Pertinent Labs:    Latest Ref Rng & Units 03/20/2022    1:05 AM 03/19/2022    4:30 AM 03/18/2022    8:05 AM  CBC  WBC 4.0 - 10.5 K/uL 7.6  7.6    Hemoglobin 12.0 - 15.0 g/dL 03/20/2022  9.9  69.4   Hematocrit 36.0 - 46.0 % 31.2  30.8  30.0   Platelets 150 - 400 K/uL 427  387         Latest Ref Rng & Units 03/21/2022    1:36 AM 03/20/2022    6:38 PM 03/20/2022    1:05 AM  CMP  Glucose 70 - 99 mg/dL 85  96  81   BUN 6 - 20 mg/dL <5   <5  <5   Creatinine 0.44 - 1.00 mg/dL 03/22/2022  6.27  0.35   Sodium 135 - 145 mmol/L 137  138  139   Potassium 3.5 - 5.1 mmol/L 3.3  3.0  2.7   Chloride 98 - 111 mmol/L 103  103  102   CO2 22 - 32 mmol/L 27  29  29    Calcium 8.9 - 10.3 mg/dL 8.5  8.3  8.0     Imaging: No results found.  Assessment/Plan:   Principal Problem:   Pelvic abscess  Active Problems:   Hypokalemia   Patient Summary: [name] is a 5 yoF with multiple recent complicated admissions for intra-abdominal abscesses treated with drain placements and prolonged antibiotic presenting today with persistent abdominal pain, nausea, and vomiting.     Abdominal pain 2/2 hx pelvic abscesses s/p washout with JP drain placement Pain continues to improve. Patient still not getting oxy po before IV dilaudid. She and her nurse are aware of this plan. Less drain output today. OBGYN following, will come evaluate again today to see if JP drains can be removed. Will maintain pain regimen today given likely  drain removal but likely reduce IV dilaudid frequency tomorrow. -IV cefepime and flagyl -Blood cx NG 3 days -pain control with oxycodone 10mg  q4 prn and dilaudid 0.5mg  q4 prn for breakthrough -appreciate OBGYN assistance  Hypokalemia Hypomagnesemia Likely 2/2 vomiting and poor po intake. K 3.3 this morning with one more bag IV K+ left. Mg responded to supplementation yesterday to 2.1 but back down to 1.7 today. Will need to provide slow repletion to avoid discomfort and continue with IV repletion as patient unable tolerate PO. - IV potassium as needed, goal >3.5 - IV magnesium 1g this morning - recheck BMP and Mg tomorrow am  Protein calorie malnutrition Poor oral intake in the setting of nausea/vomiting going on several weeks now since surgery. Required Cortrak for several days during first admission. Patient refuses hospital food. Dietitian recommended family or friends bring foods she likes, but family unable to do so  thus far. Patient only drinking cranberry juice with some fruit. Will likely need Cortak tomorrow. -Encourage po intake -cortrak Monday if no improvement in nutrition   Anemia of inflammation Thrombocytosis Likely 2/2 persistent inflammation in the setting of multiple abdominal abscesses. Hemoglobin and platelets stable.  - Will stop trending CBCs for now  Diet: Regular VTE prophylaxis: Lovenox Full code   Dispo: Admit patient to inpatient with expected length of stay of at least 2 midnights.  Sunday, MD Internal Medicine Resident PGY-1 Please contact the on call pager after 5 pm and on weekends at [phone].

## 2022-03-22 DIAGNOSIS — N739 Female pelvic inflammatory disease, unspecified: Secondary | ICD-10-CM | POA: Diagnosis not present

## 2022-03-22 DIAGNOSIS — E876 Hypokalemia: Secondary | ICD-10-CM | POA: Diagnosis not present

## 2022-03-22 DIAGNOSIS — R1012 Left upper quadrant pain: Secondary | ICD-10-CM

## 2022-03-22 DIAGNOSIS — E46 Unspecified protein-calorie malnutrition: Secondary | ICD-10-CM | POA: Diagnosis not present

## 2022-03-22 LAB — CBC WITH DIFFERENTIAL/PLATELET
Abs Immature Granulocytes: 0.03 10*3/uL (ref 0.00–0.07)
Basophils Absolute: 0 10*3/uL (ref 0.0–0.1)
Basophils Relative: 0 %
Eosinophils Absolute: 0.1 10*3/uL (ref 0.0–0.5)
Eosinophils Relative: 1 %
HCT: 31.5 % — ABNORMAL LOW (ref 36.0–46.0)
Hemoglobin: 10.5 g/dL — ABNORMAL LOW (ref 12.0–15.0)
Immature Granulocytes: 0 %
Lymphocytes Relative: 21 %
Lymphs Abs: 2.2 10*3/uL (ref 0.7–4.0)
MCH: 28.7 pg (ref 26.0–34.0)
MCHC: 33.3 g/dL (ref 30.0–36.0)
MCV: 86.1 fL (ref 80.0–100.0)
Monocytes Absolute: 0.6 10*3/uL (ref 0.1–1.0)
Monocytes Relative: 6 %
Neutro Abs: 7.4 10*3/uL (ref 1.7–7.7)
Neutrophils Relative %: 72 %
Platelets: 452 10*3/uL — ABNORMAL HIGH (ref 150–400)
RBC: 3.66 MIL/uL — ABNORMAL LOW (ref 3.87–5.11)
RDW: 17.1 % — ABNORMAL HIGH (ref 11.5–15.5)
WBC: 10.4 10*3/uL (ref 4.0–10.5)
nRBC: 0 % (ref 0.0–0.2)

## 2022-03-22 LAB — BASIC METABOLIC PANEL
Anion gap: 7 (ref 5–15)
BUN: <5 mg/dL — ABNORMAL LOW (ref 6–20)
CO2: 26 mmol/L (ref 22–32)
Calcium: 8.5 mg/dL — ABNORMAL LOW (ref 8.9–10.3)
Chloride: 101 mmol/L (ref 98–111)
Creatinine, Ser: 0.54 mg/dL (ref 0.44–1.00)
GFR, Estimated: >60 mL/min (ref >60)
Glucose, Bld: 101 mg/dL — ABNORMAL HIGH (ref 70–99)
Potassium: 3.1 mmol/L — ABNORMAL LOW (ref 3.5–5.1)
Sodium: 134 mmol/L — ABNORMAL LOW (ref 135–145)

## 2022-03-22 LAB — MAGNESIUM: Magnesium: 1.5 mg/dL — ABNORMAL LOW (ref 1.7–2.4)

## 2022-03-22 MED ORDER — POTASSIUM CHLORIDE 10 MEQ/100ML IV SOLN
10.0000 meq | INTRAVENOUS | Status: AC
  Administered 2022-03-22: 10 meq via INTRAVENOUS
  Filled 2022-03-22 (×2): qty 100

## 2022-03-22 MED ORDER — MAGNESIUM OXIDE -MG SUPPLEMENT 400 (240 MG) MG PO TABS
400.0000 mg | ORAL_TABLET | Freq: Every day | ORAL | Status: DC
  Administered 2022-03-23: 400 mg via ORAL
  Filled 2022-03-22: qty 1

## 2022-03-22 MED ORDER — POTASSIUM CHLORIDE CRYS ER 20 MEQ PO TBCR
20.0000 meq | EXTENDED_RELEASE_TABLET | Freq: Two times a day (BID) | ORAL | Status: DC
  Filled 2022-03-22: qty 1

## 2022-03-22 MED ORDER — KETOROLAC TROMETHAMINE 15 MG/ML IJ SOLN: 15.0000 mg | Freq: Four times a day (QID) | INTRAMUSCULAR | Status: DC | PRN

## 2022-03-22 MED ORDER — KETOROLAC TROMETHAMINE 30 MG/ML IJ SOLN
15.0000 mg | Freq: Four times a day (QID) | INTRAMUSCULAR | Status: AC | PRN
  Administered 2022-03-22 (×2): 15 mg via INTRAVENOUS
  Filled 2022-03-22 (×2): qty 1

## 2022-03-22 MED ORDER — HYDROMORPHONE HCL 1 MG/ML IJ SOLN
0.5000 mg | INTRAMUSCULAR | Status: DC | PRN
  Administered 2022-03-22 – 2022-03-25 (×15): 0.5 mg via INTRAVENOUS
  Filled 2022-03-22 (×15): qty 0.5

## 2022-03-22 MED ORDER — DULOXETINE HCL 30 MG PO CPEP
30.0000 mg | ORAL_CAPSULE | Freq: Every day | ORAL | Status: DC
Start: 2022-03-22 — End: 2022-03-25
  Administered 2022-03-23 – 2022-03-25 (×3): 30 mg via ORAL
  Filled 2022-03-22 (×3): qty 1

## 2022-03-22 MED ORDER — MAGNESIUM SULFATE 2 GM/50ML IV SOLN
2.0000 g | Freq: Once | INTRAVENOUS | Status: DC
Start: 2022-03-22 — End: 2022-03-22
  Filled 2022-03-22: qty 50

## 2022-03-22 NOTE — Progress Notes (Signed)
NAME:  Paula Lucas, MRN:  [id number], DOB:  [birth year], LOS: 3 ADMISSION DATE:  03/18/2022  Subjective  Patient evaluated at bedside this AM.  She claims that she has pain in her abdomen, around the area of drain placement, and she rates the pain a 7 out of 10.  Patient had a fever overnight, IV Toradol was given.  Her temperature this a.m. was 99.8.  She denies any diarrhea, constipation, vomiting.  Her appetite is almost nonexistent at this point, she is been refusing meals and medications.  Objective   Blood pressure (!) 138/98, pulse 95, temperature 99.3 F (37.4 C), temperature source Oral, resp. rate 16, last menstrual period 10/26/2021, SpO2 98 %, unknown if currently breastfeeding.     Intake/Output Summary (Last 24 hours) at 03/22/2022 1549 Last data filed at 03/22/2022 0800 Gross per 24 hour  Intake 540 ml  Output 800 ml  Net -260 ml   There were no vitals filed for this visit. Physical Exam: General: young female in pain, laying in bed, NAD, finding it difficult to leave the bed CV: normal rate and regular rhythm Pulm: normal RR Abdomen: Bandages over incision sites, tender to palpation.  Neuro: AAOx3   Labs       Latest Ref Rng & Units 03/22/2022    6:07 AM 03/20/2022    1:05 AM 03/19/2022    4:30 AM  CBC  WBC 4.0 - 10.5 K/uL 10.4  7.6  7.6   Hemoglobin 12.0 - 15.0 g/dL 53.6  64.4  9.9   Hematocrit 36.0 - 46.0 % 31.5  31.2  30.8   Platelets 150 - 400 K/uL 452  427  387       Latest Ref Rng & Units 03/22/2022    6:07 AM 03/21/2022    1:36 AM 03/20/2022    6:38 PM  BMP  Glucose 70 - 99 mg/dL 034  85  96   BUN 6 - 20 mg/dL <5  <5  <5   Creatinine 0.44 - 1.00 mg/dL 7.42  5.95  6.38   Sodium 135 - 145 mmol/L 134  137  138   Potassium 3.5 - 5.1 mmol/L 3.1  3.3  3.0   Chloride 98 - 111 mmol/L 101  103  103   CO2 22 - 32 mmol/L 26  27  29    Calcium 8.9 - 10.3 mg/dL 8.5  8.5  8.3     Summary  Summary: [name] is a 31 year old female with a history of intra  abdominal abscesses treated with antibiotics and drain placements.  She was admitted on March 18, 2022 for abdominal pain, nausea, vomiting.  Assessment & Plan:  Principal Problem:   Pelvic abscess  Active Problems:   Hypokalemia   Abdominal pain, left upper quadrant  Abdominal Pain w/ hx of pelvic abscesses s/p washout with JP drain placement Patient states her pain has not improved and is still the same as yesterday.  Both of her drains were removed today, and no drainage was seen from incisions.  - Continue antibiotic regimen of cefepime and Flagyl - Added duloxetine 30 mg, and gabapentin 300 mg 3 times daily to already established pain regimen of oxycodone 10 mg every 4 hours, and Toradol every 6 hours -OB/GYN is closely following  Hypokalemia/hypomagnesemia/protein calorie malnutrition Potassium this morning was 3.1 (down from 3.3 yesterday).  Magnesium today was 1.5 (down from 1.7 yesterday).  This is most likely due to patient's lack of nutrition.  She has denied  potassium and magnesium IV, as she states the potassium makes her skin burn.  Nurse has even tried to dilute potassium, however she still has denied it.    -Ordered p.o. potassium and magnesium supplements, we will see how patient responds. - On discussion today, patient seems to be agreeable to feeding tube if she does not continue to eat. - We will continue to encourage her to eat.  Best practice:  DIET: Regular DVT PPX: Lovenox CODE: Full code  Dispo: Expected length of stay more than 2 days  Olegario Messier MD Internal Medicine Resident PGY-1 PAGER: [phone] 03/22/2022 3:49 PM  If after hours (below), please contact on-call pager: [phone] 5PM-7AM Monday-Friday 1PM-7AM Saturday-Sunday

## 2022-03-22 NOTE — Progress Notes (Signed)
Bed soiled with urine. Full linen change done. Encouraged pt to perform her own hygiene and to call when she has to urinate; pt declined. She requests a purewick to be kept at bedside "in case she needs it". She is also declining OOB activity this morning.

## 2022-03-22 NOTE — Plan of Care (Addendum)
With 1 episode of fever last night, MD informed, IV toradol given.  Problem: Pain Managment: Goal: General experience of comfort will improve Outcome: Progressing

## 2022-03-22 NOTE — Progress Notes (Addendum)
RN called to room for IV beeping.Pt clamped IV tubing while staff not present and while potassium infusing. Educated pt on calling for assistance prior to messing with IV pump.  Pt refusing IV potassium and magnesium infusions despite education on indications and importance to plan of care. Pt states she is not willing to take either med orally. MD paged to notify.

## 2022-03-22 NOTE — Progress Notes (Signed)
She refused all her PO meds tonight.

## 2022-03-23 DIAGNOSIS — E876 Hypokalemia: Secondary | ICD-10-CM | POA: Diagnosis not present

## 2022-03-23 DIAGNOSIS — E46 Unspecified protein-calorie malnutrition: Secondary | ICD-10-CM | POA: Diagnosis not present

## 2022-03-23 DIAGNOSIS — N739 Female pelvic inflammatory disease, unspecified: Secondary | ICD-10-CM | POA: Diagnosis not present

## 2022-03-23 LAB — CULTURE, BLOOD (ROUTINE X 2)
Culture: NO GROWTH
Culture: NO GROWTH
Special Requests: ADEQUATE
Special Requests: ADEQUATE

## 2022-03-23 LAB — CBC
HCT: 30.7 % — ABNORMAL LOW (ref 36.0–46.0)
Hemoglobin: 10 g/dL — ABNORMAL LOW (ref 12.0–15.0)
MCH: 28.5 pg (ref 26.0–34.0)
MCHC: 32.6 g/dL (ref 30.0–36.0)
MCV: 87.5 fL (ref 80.0–100.0)
Platelets: 414 10*3/uL — ABNORMAL HIGH (ref 150–400)
RBC: 3.51 MIL/uL — ABNORMAL LOW (ref 3.87–5.11)
RDW: 17.1 % — ABNORMAL HIGH (ref 11.5–15.5)
WBC: 8.3 10*3/uL (ref 4.0–10.5)
nRBC: 0 % (ref 0.0–0.2)

## 2022-03-23 LAB — BASIC METABOLIC PANEL
Anion gap: 7 (ref 5–15)
BUN: <5 mg/dL — ABNORMAL LOW (ref 6–20)
CO2: 25 mmol/L (ref 22–32)
Calcium: 8.5 mg/dL — ABNORMAL LOW (ref 8.9–10.3)
Chloride: 102 mmol/L (ref 98–111)
Creatinine, Ser: 0.5 mg/dL (ref 0.44–1.00)
GFR, Estimated: >60 mL/min (ref >60)
Glucose, Bld: 80 mg/dL (ref 70–99)
Potassium: 3.1 mmol/L — ABNORMAL LOW (ref 3.5–5.1)
Sodium: 134 mmol/L — ABNORMAL LOW (ref 135–145)

## 2022-03-23 LAB — MAGNESIUM: Magnesium: 1.4 mg/dL — ABNORMAL LOW (ref 1.7–2.4)

## 2022-03-23 MED ORDER — SPIRONOLACTONE 12.5 MG HALF TABLET
12.5000 mg | ORAL_TABLET | Freq: Two times a day (BID) | ORAL | Status: DC
  Administered 2022-03-23 – 2022-03-31 (×16): 12.5 mg via ORAL
  Filled 2022-03-23 (×18): qty 1

## 2022-03-23 MED ORDER — OXYCODONE HCL 5 MG PO TABS
10.0000 mg | ORAL_TABLET | ORAL | Status: DC
  Administered 2022-03-23 – 2022-03-31 (×33): 10 mg via ORAL
  Filled 2022-03-23 (×36): qty 2

## 2022-03-23 MED ORDER — MAGNESIUM SULFATE 2 GM/50ML IV SOLN: 2.0000 g | Freq: Once | INTRAVENOUS | Status: DC

## 2022-03-23 MED ORDER — MIRTAZAPINE 15 MG PO TBDP
15.0000 mg | ORAL_TABLET | Freq: Every day | ORAL | Status: DC
  Administered 2022-03-23 – 2022-03-24 (×2): 15 mg via ORAL
  Filled 2022-03-23 (×2): qty 1

## 2022-03-23 MED ORDER — POTASSIUM CHLORIDE 10 MEQ/100ML IV SOLN
10.0000 meq | INTRAVENOUS | Status: AC
  Administered 2022-03-23: 10 meq via INTRAVENOUS

## 2022-03-23 NOTE — Plan of Care (Signed)
Refused dressing change.  Problem: Pain Managment: Goal: General experience of comfort will improve Outcome: Progressing

## 2022-03-23 NOTE — Progress Notes (Signed)
Patient spit out potassium pill. Will chart as refused. Also refused Valtrex tab

## 2022-03-23 NOTE — Progress Notes (Signed)
Pain medication given as requested. Oxy given at 1424. Dilaudid given at 1510

## 2022-03-23 NOTE — Progress Notes (Signed)
Patient refused dressing change

## 2022-03-23 NOTE — Progress Notes (Signed)
Patient refusing all medications now. She refused gabapentin, the rest of her Potassium IV as well as Mag IV. Made Jason Fila Nooruddin,MD aware

## 2022-03-23 NOTE — Progress Notes (Addendum)
Subjective:   Summary: Paula Lucas is a 31 y.o. year old female currently admitted on the IMTS HD#4 for intra-abdominal abscesses and malnutrition.  Overnight Events: Pt denied PO medications and wound change    Pt states she slept awkwardly last night and accidentally hit her abdomen on the side of the bed, causing the pain to intensify. She claims that before she hit her stomach, the pain was getting better. The pain is currently at a 6/10. She is also complaining of cramping in her calves bilaterally.   Objective:  Vital signs in last 24 hours: Vitals:   03/22/22 1828 03/22/22 2018 03/23/22 0456 03/23/22 0849  BP: (!) 143/100 (!) 139/102 116/83 (!) 137/93  Pulse: 80 81 89 87  Resp: 16 20 18 18   Temp: 99 F (37.2 C) 99.3 F (37.4 C) 98.4 F (36.9 C) 99 F (37.2 C)  TempSrc: Oral Oral Oral Oral  SpO2: 98% 97% 97% 98%   Supplemental O2: Room Air SpO2: 98 %   Physical Exam:  Constitutional: tearful and anxious woman, in no acute distress Cardiovascular: RRR, no murmurs, rubs or gallops Pulmonary/Chest: normal work of breathing on room air, lungs clear to auscultation bilaterally Abdominal: soft, mid and right abdomen very tender to light palpation.  Skin: warm and dry  There were no vitals filed for this visit.   Intake/Output Summary (Last 24 hours) at 03/23/2022 1409 Last data filed at 03/23/2022 1045 Gross per 24 hour  Intake 100 ml  Output 700 ml  Net -600 ml   Net IO Since Admission: -3,276.85 mL [03/23/22 1409]  Pertinent Labs:    Latest Ref Rng & Units 03/22/2022    6:07 AM 03/20/2022    1:05 AM 03/19/2022    4:30 AM  CBC  WBC 4.0 - 10.5 K/uL 10.4  7.6  7.6   Hemoglobin 12.0 - 15.0 g/dL 03/21/2022  31.5  9.9   Hematocrit 36.0 - 46.0 % 31.5  31.2  30.8   Platelets 150 - 400 K/uL 452  427  387        Latest Ref Rng & Units 03/23/2022    5:53 AM 03/22/2022    6:07 AM 03/21/2022    1:36 AM  CMP  Glucose 70 - 99 mg/dL 80  03/23/2022  85   BUN 6 -  20 mg/dL <5  <5  <5   Creatinine 0.44 - 1.00 mg/dL 867  6.19  5.09   Sodium 135 - 145 mmol/L 134  134  137   Potassium 3.5 - 5.1 mmol/L 3.1  3.1  3.3   Chloride 98 - 111 mmol/L 102  101  103   CO2 22 - 32 mmol/L 25  26  27    Calcium 8.9 - 10.3 mg/dL 8.5  8.5  8.5      I personally reviewed interval labs and pertinent results include: K-3.1, Mg 1.4     Assessment/Plan:   Principal Problem:   Pelvic abscess  Active Problems:   Hypokalemia   Abdominal pain, left upper quadrant   Patient Summary: Paula Lucas is a 31 y.o. with a pertinent PMH of intra-abdominal abscesses treated with antibiotics and drain placements.  #Abdominal Pain w/ hx of pelvic abscesses s/p washout with JP drain placement Pt states that before she hit her stomach on the side of the bed, pain was improving slightly.  Incisions from JP drain removal  are covered with Band-Aids, and no drainage or pus was seen from the site.  She denied changing them last night because she was trying to sleep.  -Continue antibiotic regimen of cefepime and Flagyl - Continue pain regimen of duloxetine 30 mg, gabapentin 300 mg 3 times daily, oxycodone, and Toradol.  #Hypokalemia/hypomagnesemia in the setting of malnutrition Potassium today was 3.1, which is stayed the same since yesterday.  Magnesium is 1.4, compared to 1.5 yesterday.  She continues to deny oral supplements of potassium and magnesium, however she was agreeable today to IV potassium (diluted) and magnesium.  She has been complaining of bilateral calf cramps, most likely due to her hypokalemia. -Reordered IV potassium and magnesium, will see how patient responds - If patient continues to deny supplementation, or any p.o. food we will need to insert a feeding tube.  #Depression Patient was tearful today as she does not have any appetite, and has been in and out of the hospital for the last 3 months.  She also states she has not been sleeping very well either.  She  is currently on trazodone 50 mg.  - Switched trazodone 50 mg to mirtazapine 15 mg with effort to increase appetite and help depression -Can consider psych consult   Diet: Normal IVF: None,None VTE: Enoxaparin Code: Full PT/OT recs: None, none.     Dispo: Expected length of stay more than 2 days  Olegario Messier, MD PGY-1 Internal Medicine Resident Pager Number [phone] Please contact the on call pager after 5 pm and on weekends at [phone].

## 2022-03-24 DIAGNOSIS — E876 Hypokalemia: Secondary | ICD-10-CM | POA: Diagnosis not present

## 2022-03-24 DIAGNOSIS — F331 Major depressive disorder, recurrent, moderate: Secondary | ICD-10-CM

## 2022-03-24 DIAGNOSIS — N739 Female pelvic inflammatory disease, unspecified: Secondary | ICD-10-CM | POA: Diagnosis not present

## 2022-03-24 DIAGNOSIS — R079 Chest pain, unspecified: Secondary | ICD-10-CM

## 2022-03-24 DIAGNOSIS — E46 Unspecified protein-calorie malnutrition: Secondary | ICD-10-CM | POA: Diagnosis not present

## 2022-03-24 DIAGNOSIS — F250 Schizoaffective disorder, bipolar type: Secondary | ICD-10-CM

## 2022-03-24 LAB — BASIC METABOLIC PANEL
Anion gap: 11 (ref 5–15)
BUN: <5 mg/dL — ABNORMAL LOW (ref 6–20)
CO2: 21 mmol/L — ABNORMAL LOW (ref 22–32)
Calcium: 8.8 mg/dL — ABNORMAL LOW (ref 8.9–10.3)
Chloride: 103 mmol/L (ref 98–111)
Creatinine, Ser: 0.66 mg/dL (ref 0.44–1.00)
GFR, Estimated: >60 mL/min (ref >60)
Glucose, Bld: 65 mg/dL — ABNORMAL LOW (ref 70–99)
Potassium: 3.5 mmol/L (ref 3.5–5.1)
Sodium: 135 mmol/L (ref 135–145)

## 2022-03-24 LAB — CBC
HCT: 32.7 % — ABNORMAL LOW (ref 36.0–46.0)
Hemoglobin: 10.7 g/dL — ABNORMAL LOW (ref 12.0–15.0)
MCH: 28.6 pg (ref 26.0–34.0)
MCHC: 32.7 g/dL (ref 30.0–36.0)
MCV: 87.4 fL (ref 80.0–100.0)
Platelets: 446 10*3/uL — ABNORMAL HIGH (ref 150–400)
RBC: 3.74 MIL/uL — ABNORMAL LOW (ref 3.87–5.11)
RDW: 16.9 % — ABNORMAL HIGH (ref 11.5–15.5)
WBC: 9.4 10*3/uL (ref 4.0–10.5)
nRBC: 0 % (ref 0.0–0.2)

## 2022-03-24 LAB — GLUCOSE, CAPILLARY: Glucose-Capillary: 84 mg/dL (ref 70–99)

## 2022-03-24 LAB — MAGNESIUM: Magnesium: 1.5 mg/dL — ABNORMAL LOW (ref 1.7–2.4)

## 2022-03-24 MED ORDER — QUETIAPINE FUMARATE 50 MG PO TABS
50.0000 mg | ORAL_TABLET | Freq: Every day | ORAL | Status: DC
  Administered 2022-03-24: 50 mg via ORAL
  Filled 2022-03-24: qty 1

## 2022-03-24 MED ORDER — AMOXICILLIN-POT CLAVULANATE 875-125 MG PO TABS
1.0000 | ORAL_TABLET | Freq: Two times a day (BID) | ORAL | Status: DC
  Administered 2022-03-24 – 2022-03-31 (×12): 1 via ORAL
  Filled 2022-03-24 (×14): qty 1

## 2022-03-24 NOTE — Progress Notes (Signed)
Patient refused dressing change

## 2022-03-24 NOTE — Progress Notes (Signed)
Unable to measure wound. Patient did not dressing changed or wound assessed. Asked to be left alone

## 2022-03-24 NOTE — Plan of Care (Signed)

## 2022-03-24 NOTE — Progress Notes (Signed)
   03/24/22 1315  Clinical Encounter Type  Visited With Patient  Visit Type Initial;Spiritual support  Referral From Nurse  Consult/Referral To Chaplain   Chaplain responded to a spiritual consult for support. When I arrived the patient asked if I could return later she did not feel up to a visit.  I was not able to return but handed it off to the overnight chaplain.   Valerie Roys  [facility]  [phone]

## 2022-03-24 NOTE — Progress Notes (Signed)
Subjective:   Summary: Paula Lucas is a 31 y.o. year old female currently admitted on the IMTS HD#5 for intra-abdominal abscesses and malnutrition.  Overnight Events: Pt denied PO medications    Pt states that abdominal pain is better than it was before. In the AM during rounds she denied having an appetite, but seeing her in the PM she states that she is starting to develop one. Discussions with nurse Paula Lucas revealed that she's very hot and cold when it comes to taking her medications. I was able to ask her a bit more about her psychiatry history which revealed that she was seeing somebody at [facility], and was taking the Abilify monthly shot, Seroquel, and Celexa.  She is still having cramps in her lower extremities B/L.   Objective:  Vital signs in last 24 hours: Vitals:   03/23/22 0456 03/23/22 0849 03/23/22 1719 03/23/22 2109  BP: 116/83 (!) 137/93 122/89 (!) 149/100  Pulse: 89 87 92 87  Resp: 18 18 17    Temp: 98.4 F (36.9 C) 99 F (37.2 C) 98.6 F (37 C) 99.1 F (37.3 C)  TempSrc: Oral Oral Oral Oral  SpO2: 97% 98% 97% 99%   Supplemental O2: Room Air SpO2: 99 %   Physical Exam:  Constitutional: young female laying in bed, no acute distress  Cardiovascular: RRR, no murmurs, rubs or gallops Pulmonary/Chest: normal work of breathing on room air, lungs clear to auscultation bilaterally Abdominal: soft, extremely tender on right side of abdomen, non-distended, bandages over incision sites Skin: warm and dry Extremities: No lower extremity edema present  There were no vitals filed for this visit.   Intake/Output Summary (Last 24 hours) at 03/24/2022 0709 Last data filed at 03/23/2022 2220 Gross per 24 hour  Intake 531.98 ml  Output 900 ml  Net -368.02 ml   Net IO Since Admission: -3,344.87 mL [03/24/22 0709]  Pertinent Labs:    Latest Ref Rng & Units 03/24/2022    4:31 AM 03/23/2022    5:53 AM 03/22/2022    6:07 AM  CBC  WBC 4.0 -  10.5 K/uL 9.4  8.3  10.4   Hemoglobin 12.0 - 15.0 g/dL 03/24/2022  81.0  17.5   Hematocrit 36.0 - 46.0 % 32.7  30.7  31.5   Platelets 150 - 400 K/uL 446  414  452        Latest Ref Rng & Units 03/24/2022    4:31 AM 03/23/2022    5:53 AM 03/22/2022    6:07 AM  CMP  Glucose 70 - 99 mg/dL 65  80  03/24/2022   BUN 6 - 20 mg/dL <5  <5  <5   Creatinine 0.44 - 1.00 mg/dL 585  2.77  8.24   Sodium 135 - 145 mmol/L 135  134  134   Potassium 3.5 - 5.1 mmol/L 3.5  3.1  3.1   Chloride 98 - 111 mmol/L 103  102  101   CO2 22 - 32 mmol/L 21  25  26    Calcium 8.9 - 10.3 mg/dL 8.8  8.5  8.5      Assessment/Plan:   Principal Problem:   Pelvic abscess  Active Problems:   Hypokalemia   Abdominal pain, left upper quadrant   Patient Summary: Paula Lucas is a 31 y.o. 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 She is malnourished as well. She was initially admitted to the hospital on 6/19 and stayed until 7/9 for severe sepsis and multive pelvic abscesses. She underwent ex-lap with washout. She was then discharged, and readmitted on 7/22 for fever and abdominal pain. She was then discharged on the 25th with cefadroxil and flagyl. She then presented again on the 27th of July, with worsening abdominal pain.    #Abdominal Pain w/ hx of pelvic abscesses s/p washout with JP drain palcement Pt says that the pain is better than yesterday.  There is significant tenderness on palpation of the right side of the abdomen.  She has been on Flagyl and cefepime for empiric control of the abscesses.   Plan: - Changed her Flagyl and Cefepime to original ID recommendation of Augmentin Twice Daily P.O  - Continue dressing changes - Encourage patient to move out of bed, as per nurse she has been sitting in bed since she arrived on floor  #Hypokalemia/hypomagnesemia in the setting of malnutrition In the AM during rounds  patient was not interested in eating and had no appetite.  To help stimulate appetite, we prescribed the mirtazapine disintegrating tablet.  She now claims in the PM that she actually has an appetite and has taken a couple bites of her food.  She is still complaining of muscle aches in her lower extremities bilaterally.  Potassium is sitting at 3.5 and magnesium is at 1.5. Plan: - Continue mirtazapine - Continue encouraging patient to eat -If feeding tube is necessary will reconsider on Friday  #Depression/schizoaffective disorder/bipolar disorder Upon further questioning of the patient in the p.m., she was able to tell me that she has been seeing a psychiatrist at [facility] before her health issue started.  Last time she saw them was 2 months ago.  She states her diagnoses include major depressive disorder, bipolar disorder, and schizoaffective disorder.  She was previously on Abilify, Seroquel, Celexa which she states she has not been on since the first time she was hospitalized.  - Consider psychiatry consult  in the morning  - 50 mg of Seroquel given daily at bedtime to help with sleep, and also see how she responds to initiating psych medication  #Chest Pain Patient has been complaining of chest pain that started last night. She states it's been under her left breast. EKG showed normal sinus rhythm.   Plan: - Could be MSK related, consider treating conservatively with tylenol   Diet: Normal VTE: Lovenox Code: Full   Dispo: Expected length of stay more than 2 days in hospital   Paula Messier, MD PGY-1 Internal Medicine Resident Pager Number [phone] Please contact the on call pager after 5 pm and on weekends at [phone].

## 2022-03-25 DIAGNOSIS — N739 Female pelvic inflammatory disease, unspecified: Secondary | ICD-10-CM | POA: Diagnosis not present

## 2022-03-25 DIAGNOSIS — E876 Hypokalemia: Secondary | ICD-10-CM | POA: Diagnosis not present

## 2022-03-25 DIAGNOSIS — F250 Schizoaffective disorder, bipolar type: Secondary | ICD-10-CM

## 2022-03-25 DIAGNOSIS — E46 Unspecified protein-calorie malnutrition: Secondary | ICD-10-CM | POA: Diagnosis not present

## 2022-03-25 LAB — BASIC METABOLIC PANEL
Anion gap: 11 (ref 5–15)
BUN: <5 mg/dL — ABNORMAL LOW (ref 6–20)
CO2: 25 mmol/L (ref 22–32)
Calcium: 9 mg/dL (ref 8.9–10.3)
Chloride: 100 mmol/L (ref 98–111)
Creatinine, Ser: 0.57 mg/dL (ref 0.44–1.00)
GFR, Estimated: >60 mL/min (ref >60)
Glucose, Bld: 85 mg/dL (ref 70–99)
Potassium: 3.2 mmol/L — ABNORMAL LOW (ref 3.5–5.1)
Sodium: 136 mmol/L (ref 135–145)

## 2022-03-25 LAB — MAGNESIUM: Magnesium: 1.5 mg/dL — ABNORMAL LOW (ref 1.7–2.4)

## 2022-03-25 MED ORDER — MIRTAZAPINE 15 MG PO TABS
7.5000 mg | ORAL_TABLET | Freq: Every day | ORAL | Status: DC
  Administered 2022-03-25 – 2022-03-30 (×6): 7.5 mg via ORAL
  Filled 2022-03-25 (×6): qty 1

## 2022-03-25 MED ORDER — HYDROMORPHONE HCL 1 MG/ML IJ SOLN: 0.5000 mg | Freq: Four times a day (QID) | INTRAMUSCULAR | Status: DC | PRN

## 2022-03-25 MED ORDER — WHITE PETROLATUM EX OINT
TOPICAL_OINTMENT | CUTANEOUS | Status: DC | PRN
Start: 2022-03-25 — End: 2022-03-31

## 2022-03-25 MED ORDER — HYDROMORPHONE HCL 1 MG/ML IJ SOLN
0.5000 mg | INTRAMUSCULAR | Status: DC | PRN
  Administered 2022-03-25 – 2022-03-26 (×4): 0.5 mg via INTRAVENOUS
  Filled 2022-03-25 (×4): qty 0.5

## 2022-03-25 MED ORDER — QUETIAPINE FUMARATE 50 MG PO TABS
100.0000 mg | ORAL_TABLET | Freq: Every day | ORAL | Status: DC
  Administered 2022-03-25 – 2022-03-30 (×6): 100 mg via ORAL
  Filled 2022-03-25 (×6): qty 2

## 2022-03-25 MED ORDER — MIRTAZAPINE 15 MG PO TBDP
7.5000 mg | ORAL_TABLET | Freq: Every day | ORAL | Status: DC
  Filled 2022-03-25: qty 0.5

## 2022-03-25 NOTE — Progress Notes (Signed)
PT Cancellation Note  Patient Details Name: Paula Lucas MRN: [id number] DOB: 12-09-1990   Cancelled Treatment:    Reason Eval/Treat Not Completed: Patient declined, no reason specified Pt refusing stating she was too tired to work with PT. Educated about importance of mobility, but pt continued to refuse. Will continue to follow acutely.   Cindee Salt, DPT  Acute Rehabilitation Services  Office: [phone]    Lehman Prom 03/25/2022, 1:50 PM

## 2022-03-25 NOTE — TOC Initial Note (Addendum)
Transition of Care [facility]) - Initial/Assessment Note    Patient Details  Name: Paula Lucas MRN: [id number] Date of Birth: Feb 24, 1991  [facility] CM/SW Contact:    Kingsley Plan, RN Phone Number: 03/25/2022, 11:12 AM  Clinical Narrative:                 NCM spoke to patient at bedside. Patient from home with mother. Confirmed face sheet information.   PCP Jonah Blue at [facility] updated face sheet.   Will schedule appointment closer to discharge.  Information placed on AVS  NCM unable to found an accepting home health agency due to patient's insurance. Per chart and patient , her mother has been providing wound care at home . Patient states her mother can continue to provide care.  Bedside nurse will provide updated wound care education  to patient and mother prior to discharge and some dressing supplies at discharge.   NCM requested a list of wound care supplies ( messaged attending team OB/GYN and nurse)  and will order will [employer] once received.   NCM called patient's mother and discussed above and confirmed she can continue to assist with wound care.   Expected Discharge Plan: Home/Self Care Barriers to Discharge: Continued Medical Work up   Patient Goals and CMS Choice Patient states their goals for this hospitalization and ongoing recovery are:: to return to home      Expected Discharge Plan and Services Expected Discharge Plan: Home/Self Care In-house Referral: Financial Counselor Discharge Planning Services: CM Consult   Living arrangements for the past 2 months: Single Family Home                   DME Agency: NA       HH Arranged: NA          Prior Living Arrangements/Services Living arrangements for the past 2 months: Single Family Home Lives with:: Parents Patient language and need for interpreter reviewed:: Yes Do you feel safe going back to the place where you live?: Yes      Need for Family  Participation in Patient Care: Yes (Comment) Care giver support system in place?: Yes (comment)   Criminal Activity/Legal Involvement Pertinent to Current Situation/Hospitalization: No - Comment as needed  Activities of Daily Living      Permission Sought/Granted   Permission granted to share information with : Yes, Verbal Permission Granted  Share Information with NAME: mother Rometta Emery [phone]           Emotional Assessment Appearance:: Appears stated age Attitude/Demeanor/Rapport: Engaged Affect (typically observed): Accepting Orientation: : Oriented to Self, Oriented to Place, Oriented to  Time, Oriented to Situation Alcohol / Substance Use: Not Applicable Psych Involvement: No (comment)  Admission diagnosis:  Pelvic abscess in female [N73.9] Patient Active Problem List   Diagnosis Date Noted   Abdominal pain, left upper quadrant    Fever 03/14/2022   COVID-19 03/13/2022   Abscess of abdominal cavity (HCC)    Hypokalemia 03/05/2022   Pleural effusion 03/05/2022   Acute blood loss anemia 03/05/2022   Debility 02/28/2022   Depression 02/23/2022   Protein-calorie malnutrition (HCC)    Hx of Fitz-Hugh-Curtis syndrome    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 [place] [address] Phone: [phone] Fax: [phone]  [facility] [address] [place] [address] Phone: [phone] Fax: [phone]     Social Determinants of Health (SDOH) Interventions    Readmission Risk Interventions     No data to display

## 2022-03-25 NOTE — Evaluation (Signed)
Physical Therapy Evaluation Patient Details Name: Paula Lucas MRN: [id number] DOB: [birth year] Today's Date: 03/25/2022  History of Present Illness  Pt is a 31 y/o female admitted secondary to increased abdominal pain from pelvic abscess. Recent lengthy admission for same. PMH includes  MDD, PTSD types and has a past medical history of tubo-ovarian abscess.  Clinical Impression  Pt admitted secondary to problem above with deficits below. Mild unsteadiness noted, but no overt LOB. One seated rest required secondary to fatigue. Anticipate pt will progress well and will not require follow up PT services. Encouraged mobility with mobility specialist team. Will continue to follow acutely.        Recommendations for follow up therapy are one component of a multi-disciplinary discharge planning process, led by the attending physician.  Recommendations may be updated based on patient status, additional functional criteria and insurance authorization.  Follow Up Recommendations No PT follow up      Assistance Recommended at Discharge Intermittent Supervision/Assistance  Patient can return home with the following  Assistance with cooking/housework;Assist for transportation    Equipment Recommendations None recommended by PT  Recommendations for Other Services       Functional Status Assessment Patient has had a recent decline in their functional status and demonstrates the ability to make significant improvements in function in a reasonable and predictable amount of time.     Precautions / Restrictions Precautions Precautions: Fall Restrictions Weight Bearing Restrictions: No      Mobility  Bed Mobility Overal bed mobility: Modified Independent                  Transfers Overall transfer level: Modified independent                      Ambulation/Gait Ambulation/Gait assistance: Supervision Gait Distance (Feet): 1000 Feet Assistive device: None Gait  Pattern/deviations: Step-through pattern Gait velocity: Decreased     General Gait Details: Required one seated rest secondary to fatigue. Mild unsteadiness, but no overt LOB.  Stairs            Wheelchair Mobility    Modified Rankin (Stroke Patients Only)       Balance Overall balance assessment: Mild deficits observed, not formally tested                                           Pertinent Vitals/Pain Pain Assessment Pain Assessment: Faces Faces Pain Scale: Hurts a little bit Pain Location: abdomen Pain Descriptors / Indicators: Constant, Guarding Pain Intervention(s): Limited activity within patient's tolerance, Monitored during session, Repositioned    Home Living Family/patient expects to be discharged to:: Private residence Living Arrangements: Parent Available Help at Discharge: Available 24 hours/day Type of Home: Apartment Home Access: Level entry     Alternate Level Stairs-Number of Steps: 7 steps Home Layout: Two level Home Equipment: None      Prior Function Prior Level of Function : Needs assist               ADLs Comments: Sister has been helping as needed for dressing changes and ADL tasks     Hand Dominance        Extremity/Trunk Assessment   Upper Extremity Assessment Upper Extremity Assessment: Defer to OT evaluation    Lower Extremity Assessment Lower Extremity Assessment: Generalized weakness    Cervical / Trunk Assessment Cervical / Trunk  Assessment: Other exceptions Cervical / Trunk Exceptions: abdominal wounds  Communication   Communication: No difficulties  Cognition Arousal/Alertness: Awake/alert Behavior During Therapy: WFL for tasks assessed/performed Overall Cognitive Status: Within Functional Limits for tasks assessed                                          General Comments      Exercises     Assessment/Plan    PT Assessment Patient needs continued PT services   PT Problem List Decreased strength;Decreased activity tolerance;Decreased mobility       PT Treatment Interventions Gait training;Stair training;Functional mobility training;Therapeutic activities;Therapeutic exercise;Balance training;Patient/family education    PT Goals (Current goals can be found in the Care Plan section)  Acute Rehab PT Goals Patient Stated Goal: to go home PT Goal Formulation: With patient Time For Goal Achievement: 04/08/22 Potential to Achieve Goals: Good    Frequency Min 2X/week     Co-evaluation               AM-PAC PT "6 Clicks" Mobility  Outcome Measure Help needed turning from your back to your side while in a flat bed without using bedrails?: None Help needed moving from lying on your back to sitting on the side of a flat bed without using bedrails?: None Help needed moving to and from a bed to a chair (including a wheelchair)?: None Help needed standing up from a chair using your arms (e.g., wheelchair or bedside chair)?: None Help needed to walk in hospital room?: A Little Help needed climbing 3-5 steps with a railing? : A Little 6 Click Score: 22    End of Session   Activity Tolerance: Patient tolerated treatment well Patient left: in bed;with call bell/phone within reach Nurse Communication: Mobility status PT Visit Diagnosis: Other abnormalities of gait and mobility (R26.89)    Time: 1740-8144 PT Time Calculation (min) (ACUTE ONLY): 21 min   Charges:   PT Evaluation $PT Eval Low Complexity: 1 Low          Cindee Salt, DPT  Acute Rehabilitation Services  Office: [phone]   Lehman Prom 03/25/2022, 4:27 PM

## 2022-03-25 NOTE — Progress Notes (Signed)
Patient declines dressing change at this time. States she will call RN when ready to have dressing changed.

## 2022-03-25 NOTE — Plan of Care (Signed)
  Problem: Health Behavior/Discharge Planning: Goal: Ability to manage health-related needs will improve Outcome: Progressing   

## 2022-03-25 NOTE — Progress Notes (Signed)
   03/25/22 1035  Clinical Encounter Type  Visited With Patient;Health care provider  Visit Type Initial;Spiritual support  Referral From Nurse  Consult/Referral To Chaplain   Chaplain attempted to visit with patient as outlined on spiritual consult list.  Chaplain spoke with Case Worker Herbert Seta) who indicated that patient will be further evaluated. Patient indicated that she was in pain and needed to sleep and Chaplain will follow-up at a later time.    Jon Gills, Resident Chaplain [phone]

## 2022-03-25 NOTE — Progress Notes (Signed)
Subjective:   Summary: Paula Lucas is a 31 y.o. year old female currently admitted on the IMTS HD#6 for intra-abdominal abscesses and malnutrition.  Overnight Events: NOE   Pt states that abdominal pain is slowly improving. She has been getting her appetite back slowly as well, and even though she did not eat last night, she says she has an interest in eating now and is getting hungry. Encouraged patient to move around and get out of bed. She is interested in getting back on her psych medications.   Objective:  Vital signs in last 24 hours: Vitals:   03/24/22 1602 03/24/22 2012 03/25/22 0217 03/25/22 0805  BP: (!) 151/99 (!) 146/110 111/81 110/76  Pulse: 77 87 87 79  Resp: 20 17 17 16   Temp: 98.7 F (37.1 C) 99.7 F (37.6 C) 98.3 F (36.8 C) 98.8 F (37.1 C)  TempSrc: Axillary Oral Oral Oral  SpO2: 98% 98% 94% 96%   Supplemental O2: Room Air SpO2: 96 %   Physical Exam:  Constitutional: young female laying in bed, no acute distress Cardiovascular: RRR, no murmurs, rubs or gallops Pulmonary/Chest: normal work of breathing on room air, lungs clear to auscultation bilaterally Abdominal: soft, still tender on the R side of abdomen, non-distended, dressing over incision sites where JP drains were Skin: warm and dry Extremities:  no lower extremity edema present  There were no vitals filed for this visit.   Intake/Output Summary (Last 24 hours) at 03/25/2022 1126 Last data filed at 03/24/2022 1601 Gross per 24 hour  Intake 120 ml  Output 400 ml  Net -280 ml   Net IO Since Admission: -3,984.87 mL [03/25/22 1126]  Pertinent Labs:    Latest Ref Rng & Units 03/24/2022    4:31 AM 03/23/2022    5:53 AM 03/22/2022    6:07 AM  CBC  WBC 4.0 - 10.5 K/uL 9.4  8.3  10.4   Hemoglobin 12.0 - 15.0 g/dL 03/24/2022  16.1  09.6   Hematocrit 36.0 - 46.0 % 32.7  30.7  31.5   Platelets 150 - 400 K/uL 446  414  452        Latest Ref Rng & Units 03/24/2022    4:31 AM  03/23/2022    5:53 AM 03/22/2022    6:07 AM  CMP  Glucose 70 - 99 mg/dL 65  80  03/24/2022   BUN 6 - 20 mg/dL <5  <5  <5   Creatinine 0.44 - 1.00 mg/dL 409  8.11  9.14   Sodium 135 - 145 mmol/L 135  134  134   Potassium 3.5 - 5.1 mmol/L 3.5  3.1  3.1   Chloride 98 - 111 mmol/L 103  102  101   CO2 22 - 32 mmol/L 21  25  26    Calcium 8.9 - 10.3 mg/dL 8.8  8.5  8.5       Assessment/Plan:   Principal Problem:   Pelvic abscess  Active Problems:   Hypokalemia   Abdominal pain, left upper quadrant   Patient Summary: Paula Lucas is a 31 y.o. with a pertinent PMH of intra-abdominal abscesses secondary to PID, malnourishment, schizoaffective disorder, bipolar disorder, major depressive disorder, who presented with abdominal pain and admitted for recurrent intra-abdominal abscesses secondary to PID. She was initially admitted to the hospital on 6/19 and stayed until 7/9 for severe sepsis and multiple pelvic abscesses. At  that time, she underwent ex-lap with washout. She was then discharged, and readmitted on 7/22 for fever and abdominal pain. She was then discharged on 7/25 with cefadroxil and flagyl. She then presented again on 7/27 with worsening abdominal pain.    #Abdominal pain w/ hx of pelvic abscesses s/p washout with JP drain Placement Pt says pain is continually getting better. There is still tenderness on palpation of right side of abdomen. We transitioned her yesterday from Flagyl and cefepime to original medical regimen proposed by ID which was Augmentin Twice Daily. For pain, she is on Duloxetine 30 mg, oral Oxycodone 10 mg Q4H, and Dilauded PRN Q4H  -Pt states her pain is getting better, so goal right now is to slowly wean her off pain medications. We will increase the Dilaudid to [facility] tomorrow to help with that process.   -Entered a consult for PT to help patient move out of bed and go to the bathroom herself -Continue dressing changes - May be able to discharge early next  week  #Hypokalemia/hypomagnesemia in the setting of malnutrition Potassium today is 3.2, and Magnesium is 1.5 She did not complain of any muscle cramps this AM. She is on spironolactone to help reduce the loss of potassium. She states that she is a lot more interested in eating, and has been getting hungry lately even though she did not eat last night. We are changing her mirtazapine to 7.5mg  instead of 15 because of the increased effect of appetite stimulation with the lower dose.  Her breakfast was still sitting there when we were rounding, however she said she plans on eating it.   Plan:  - Monitor Potassium and Magnesium levels - Continue encouraging patient to eat - Changed mirtazapine 15mg  to 7.5 mg  #Depression/schizoaffective disorder/Bipolar Disorder Pt denied seeing chaplain last night as she wanted to be left alone. She accepts that she want to start taking her medications again, which include Abilify, Seroquel, and Celexa, however she hasn't been to her psychiatrist in [place] for two months. I gave her Seroquel 50mg  yesterday and she seems to be in a better mood today, and says she slept peacefully throughout the night.   Plan -Psychiatry consult placed to assist with medication regimen, who recommended continuing Remeron except in the NML form, changing Seroquel to 100mg , and discontinuing duloxetine - Ideally the plan would be for whenever she is medically clear, to discharge her on Abilify, Seroquel, and Remeron  #Chest Pain Pt is still complaining of chest pain, most likely due to patient not ambulating for the last week, which could be leading to atelectasis.  -Pt consult ordered    Diet: Normal IVF: None,None VTE: Lovenox Code: Full   Dispo: Expected length of stay more than 2 days in hospital.   Evionnaz, MD PGY-1 Internal Medicine Resident Pager Number [phone] Please contact the on call pager after 5 pm and on weekends at [phone].
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
## 2022-03-26 DIAGNOSIS — E876 Hypokalemia: Secondary | ICD-10-CM | POA: Diagnosis not present

## 2022-03-26 DIAGNOSIS — E46 Unspecified protein-calorie malnutrition: Secondary | ICD-10-CM | POA: Diagnosis not present

## 2022-03-26 DIAGNOSIS — N739 Female pelvic inflammatory disease, unspecified: Secondary | ICD-10-CM | POA: Diagnosis not present

## 2022-03-26 LAB — BASIC METABOLIC PANEL
Anion gap: 12 (ref 5–15)
BUN: <5 mg/dL — ABNORMAL LOW (ref 6–20)
CO2: 25 mmol/L (ref 22–32)
Calcium: 9.1 mg/dL (ref 8.9–10.3)
Chloride: 99 mmol/L (ref 98–111)
Creatinine, Ser: 0.56 mg/dL (ref 0.44–1.00)
GFR, Estimated: >60 mL/min (ref >60)
Glucose, Bld: 84 mg/dL (ref 70–99)
Potassium: 3.2 mmol/L — ABNORMAL LOW (ref 3.5–5.1)
Sodium: 136 mmol/L (ref 135–145)

## 2022-03-26 LAB — TSH: TSH: 1.224 u[IU]/mL (ref 0.350–4.500)

## 2022-03-26 LAB — MAGNESIUM: Magnesium: 1.4 mg/dL — ABNORMAL LOW (ref 1.7–2.4)

## 2022-03-26 LAB — VITAMIN B12: Vitamin B-12: 208 pg/mL (ref 180–914)

## 2022-03-26 LAB — FOLATE: Folate: 8.8 ng/mL (ref >5.9)

## 2022-03-26 MED ORDER — HYDROMORPHONE HCL 1 MG/ML IJ SOLN
0.5000 mg | Freq: Four times a day (QID) | INTRAMUSCULAR | Status: DC | PRN
  Administered 2022-03-26 – 2022-03-28 (×9): 0.5 mg via INTRAVENOUS
  Filled 2022-03-26 (×9): qty 0.5

## 2022-03-26 NOTE — Progress Notes (Signed)
Subjective:   Summary: Paula Lucas is a 31 y.o. year old female currently admitted on the IMTS HD#7 for intra-abdominal abscesses and malnutrition.  Overnight Events: NOE   Provided positive encouragement on patients progress so far. She had a walk with physical therapy yesterday, and is showering now as well. She also has been expressing desires to eat, so her appetite is coming back as well.   Objective:  Vital signs in last 24 hours: Vitals:   03/25/22 0217 03/25/22 0805 03/25/22 2146 03/26/22 0757  BP: 111/81 110/76 116/78 99/68  Pulse: 87 79 94 91  Resp: 17 16  18   Temp: 98.3 F (36.8 C) 98.8 F (37.1 C) 98.1 F (36.7 C) 97.8 F (36.6 C)  TempSrc: Oral Oral Oral   SpO2: 94% 96% 97% 97%   Supplemental O2: Room Air SpO2: 97 %   Physical Exam:  Constitutional: Young female laying in bed, no acute distress Cardiovascular: RRR, no murmurs, rubs or gallops Pulmonary/Chest: normal work of breathing on room air, lungs clear to auscultation bilaterally Abdominal: soft, tenderness on R side of abdomen, non-distended, dressing over incision sites where JP drains were Skin: warm and dry Extremities: No lower extremity edema present  There were no vitals filed for this visit.   Intake/Output Summary (Last 24 hours) at 03/26/2022 1256 Last data filed at 03/26/2022 0842 Gross per 24 hour  Intake 220 ml  Output --  Net 220 ml   Net IO Since Admission: -3,764.87 mL [03/26/22 1256]  Pertinent Labs:    Latest Ref Rng & Units 03/24/2022    4:31 AM 03/23/2022    5:53 AM 03/22/2022    6:07 AM  CBC  WBC 4.0 - 10.5 K/uL 9.4  8.3  10.4   Hemoglobin 12.0 - 15.0 g/dL 03/24/2022  12.4  58.0   Hematocrit 36.0 - 46.0 % 32.7  30.7  31.5   Platelets 150 - 400 K/uL 446  414  452        Latest Ref Rng & Units 03/26/2022    1:24 AM 03/25/2022   10:41 AM 03/24/2022    4:31 AM  CMP  Glucose 70 - 99 mg/dL 84  85  65   BUN 6 - 20 mg/dL <5  <5  <5   Creatinine 0.44 - 1.00 mg/dL  05/24/2022  3.38  2.50   Sodium 135 - 145 mmol/L 136  136  135   Potassium 3.5 - 5.1 mmol/L 3.2  3.2  3.5   Chloride 98 - 111 mmol/L 99  100  103   CO2 22 - 32 mmol/L 25  25  21    Calcium 8.9 - 10.3 mg/dL 9.1  9.0  8.8     Assessment/Plan:   Principal Problem:   Pelvic abscess  Active Problems:   Hypokalemia   Abdominal pain, left upper quadrant   Schizoaffective disorder, bipolar type (HCC)   Patient Summary: Paula Lucas is a 31 y.o. with a pertinent PMH of intra-abdominal abscesses secondary to PID, malnourishment, schizoaffective disorder, bipolar disorder, major depressive disorder, who presented with abdominal pain and was admitted for recurrent intra-intra-abdominal abscesses secondary to PID. She was initially admitted to the hospital on 6/19 and stayed until 7/9 for severe sepsis and multiple pelvic abscesses. At that time, she underwent ex-lap with washout. She was then discharged, and readmitted on 7/22 for fever and abdominal pain, then discharged on  7/25 with cefadroxil and flagyl. She then presented again on 7/27 with worsening abdominal pain.   #Abdominal Pain w/ Hx of Pelvic Abscesses s/p washout with JP Drain Placement Pt endorses feeling better. Reassured patient she is improving, however abdomen is still tender on the R side. We are increasing her interval for dilaudid to help wean her off the pain medicine. She is on oral oxycodone 10 mg Q4H, Dilaudid PRN Q6H. She is on Augmentin until her follow up with infectious disease on 8/11.  Plan: -Continue encouraging patient to go on walks - Slowly wean her off pain medication - Continue Augmentin    #Hypokalemia/Hypomagnesemia in the setting of malnutrition Potassium today was 3.2, and Mg was 1.4. She denies any muscle cramps. Currently on IV Potassium and IV magnesium, as well as spironolactone to help reduce loss of potassium. Her appetite was slowly and surely increasing, as she was asking for a bagel from [employer] today.  She is also on mirtazapine 7.5mg  to help stimulate appetite.   Plan: -Monitor Potassium and Magnesium levels  - Continue encouraging patient to eat   #Depression/Schizoaffective Disorder/Bipolar Disorder Psychiatry has seen her today and recommended to continue taking and the Remeron 7.5mg  Seroquel 100 mg. If patient is here on Sunday or Monday, we may be able to start Abilify 5 mg for mood and instability. Could eventually D/C Remeron in a month if appetite is back completely.   Plan: - Appreciate psych recommendations - Continue current medications  Diet: Normal IVF: None,None VTE: Lovenox Code: Full    Dispo: Expected length of stay more than 2 days in hospital  Olegario Messier, MD PGY-1 Internal Medicine Resident Pager Number [phone] Please contact the on call pager after 5 pm and on weekends at [phone].
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
## 2022-03-26 NOTE — Progress Notes (Signed)
Pt refused all vitals. Steward Drone RN notified.

## 2022-03-26 NOTE — Progress Notes (Signed)
Patient did not eat any meals or snack on this shift. She did endorse an appetite and ordered lunch however when it came she stated it tasted funny and no longer wanted it. She has been drinking water and juice through out the day.

## 2022-03-26 NOTE — TOC Progression Note (Signed)
Transition of Care [facility]) - Progression Note    Patient Details  Name: FLOYE FESLER MRN: [id number] Date of Birth: June 22, 1991  Transition of [facility]) CM/SW Contact  Nadene Rubins Adria Devon, RN Phone Number: 03/26/2022, 3:38 PM  Clinical Narrative:     Nurse provided list of dressing supplies. NCM ordered same through [employer]   Expected Discharge Plan: Home/Self Care Barriers to Discharge: Continued Medical Work up  Expected Discharge Plan and Services Expected Discharge Plan: Home/Self Care In-house Referral: Financial Counselor Discharge Planning Services: CM Consult   Living arrangements for the past 2 months: Single Family Home                   DME Agency: NA       HH Arranged: NA           Social Determinants of Health (SDOH) Interventions    Readmission Risk Interventions     No data to display

## 2022-03-26 NOTE — Progress Notes (Signed)
Pt has developed a rash of red raised bumps that are generalized but cluster on her rt knee. She states that it itches.

## 2022-03-26 NOTE — Progress Notes (Signed)
   03/26/22 1200  Mobility  HOB Elevated/Bed Position Self regulated  Activity Ambulated independently in hallway  Range of Motion/Exercises Active  Level of Assistance Independent  Assistive Device None  Distance Ambulated (ft) 2200 ft  Activity Response Tolerated well  Transport method Ambulatory  $Mobility charge 1 Mobility   Pt was agreeable to mobility after encouragement. Complained of ankle tightness before ambulation, seemed to improve afterwards. Pt back in room w/ necessities in reach.   [facility]

## 2022-03-27 DIAGNOSIS — N739 Female pelvic inflammatory disease, unspecified: Secondary | ICD-10-CM | POA: Diagnosis not present

## 2022-03-27 DIAGNOSIS — E46 Unspecified protein-calorie malnutrition: Secondary | ICD-10-CM | POA: Diagnosis not present

## 2022-03-27 DIAGNOSIS — E876 Hypokalemia: Secondary | ICD-10-CM | POA: Diagnosis not present

## 2022-03-27 LAB — BASIC METABOLIC PANEL
Anion gap: 9 (ref 5–15)
BUN: <5 mg/dL — ABNORMAL LOW (ref 6–20)
CO2: 27 mmol/L (ref 22–32)
Calcium: 9.1 mg/dL (ref 8.9–10.3)
Chloride: 99 mmol/L (ref 98–111)
Creatinine, Ser: 0.6 mg/dL (ref 0.44–1.00)
GFR, Estimated: >60 mL/min (ref >60)
Glucose, Bld: 89 mg/dL (ref 70–99)
Potassium: 3.1 mmol/L — ABNORMAL LOW (ref 3.5–5.1)
Sodium: 135 mmol/L (ref 135–145)

## 2022-03-27 LAB — LIPID PANEL
Cholesterol: 115 mg/dL (ref 0–200)
HDL: 20 mg/dL — ABNORMAL LOW (ref >40)
LDL Cholesterol: 74 mg/dL (ref 0–99)
Total CHOL/HDL Ratio: 5.8 RATIO
Triglycerides: 105 mg/dL (ref <150)
VLDL: 21 mg/dL (ref 0–40)

## 2022-03-27 LAB — MAGNESIUM: Magnesium: 1.5 mg/dL — ABNORMAL LOW (ref 1.7–2.4)

## 2022-03-27 LAB — PHOSPHORUS: Phosphorus: 4.1 mg/dL (ref 2.5–4.6)

## 2022-03-27 MED ORDER — DIPHENHYDRAMINE HCL 25 MG PO CAPS
25.0000 mg | ORAL_CAPSULE | Freq: Four times a day (QID) | ORAL | Status: DC | PRN
  Administered 2022-03-27: 25 mg via ORAL
  Filled 2022-03-27: qty 1

## 2022-03-27 NOTE — Progress Notes (Signed)
NAME:  Paula Lucas, MRN:  [id number], DOB:  08-Aug-1991, LOS: 8 ADMISSION DATE:  03/18/2022  Subjective  Patient evaluated at bedside this AM. States she is feeling okay this morning, was able to eat and move around some yesterday. No acute issues today.   Objective   Blood pressure 101/69, pulse (!) 103, temperature 99.1 F (37.3 C), temperature source Oral, resp. rate 16, last menstrual period 10/26/2021, SpO2 96 %, unknown if currently breastfeeding.     Intake/Output Summary (Last 24 hours) at 03/27/2022 1049 Last data filed at 03/26/2022 1714 Gross per 24 hour  Intake 240 ml  Output --  Net 240 ml   There were no vitals filed for this visit.  Physical Exam: General: Resting comfortably in bed, no acute distress CV: Regular rate, rhythm. No murmurs appreciated. Pulm: Normal work of breathing on room air. Clear to ausculation bilaterally. Abdomen: Soft, non-distended. Dressing in place. MSK: Normal bulk, tone. No peripheral edema. Extremities warm. Neuro: Awake, alert, conversing appropriately. Psych: Normal speech.  Labs       Latest Ref Rng & Units 03/24/2022    4:31 AM 03/23/2022    5:53 AM 03/22/2022    6:07 AM  CBC  WBC 4.0 - 10.5 K/uL 9.4  8.3  10.4   Hemoglobin 12.0 - 15.0 g/dL 11.9  14.7  82.9   Hematocrit 36.0 - 46.0 % 32.7  30.7  31.5   Platelets 150 - 400 K/uL 446  414  452       Latest Ref Rng & Units 03/27/2022    1:01 AM 03/26/2022    1:24 AM 03/25/2022   10:41 AM  BMP  Glucose 70 - 99 mg/dL 89  84  85   BUN 6 - 20 mg/dL <5  <5  <5   Creatinine 0.44 - 1.00 mg/dL 5.62  1.30  8.65   Sodium 135 - 145 mmol/L 135  136  136   Potassium 3.5 - 5.1 mmol/L 3.1  3.2  3.2   Chloride 98 - 111 mmol/L 99  99  100   CO2 22 - 32 mmol/L 27  25  25    Calcium 8.9 - 10.3 mg/dL 9.1  9.1  9.0     Summary  Paula Lucas is 31yo person with multiple intra-abdominal abscesses 2/2 pelvic inflammatory disease, schizoaffective disorder, bipolar disorder, major depressive  disorder admitted 7/27 with worsening abdominal pain related to intra-abdominal abscesses, continuing to improve with titration of psychiatric medications.   Assessment & Plan:  Principal Problem:   Pelvic abscess  Active Problems:   Hypokalemia   Abdominal pain, left upper quadrant   Schizoaffective disorder, bipolar type (HCC)  #Subacute abdominal pain #Multiple intra-abdominal abscesses s/p washout, JP drain placement Yesterday we were able to wean down IV dilaudid to every 6 hours. This morning she appeared comfortable, pain is currently well-tolerated. Tomorrow will plan to wean down again to every 8 hours, hopefully will be able to d/c IV dilaudid in the early part of this next week. We will continue with Augmentin through 8/11.  - Augmentin 875-125mg  twice daily through 8/11 - Oxycodone 10mg  every 4 hours - IV dilaudid 0.5mg  every 6 hours as needed - Hold & Call MD if SBP<90, HR<65, RR<10, O2<90, or altered mental status.  #Persistent hypokalemia, hypomagnesemia #Protein-calorie malnutrition Since addition of mirtazapine, her appetite has markedly improved. She has refused IV and oral potassium and magnesium at this point. Will check a phosphorus as well, might be able to  use a different formulation of potassium, like potassium phosphate. We will look into this. - Follow-up BMP, Mg daily - Follow-up phosphorus - Continue mirtazapine 7.5mg  daily  #Schizoaffective disorder #Bipolar disorder #Major depressions disorder Appreciate psychiatry's assistance in medication titration. We will continue with seroquel and Remeron per psych today. Likely will need antipsychotic prior to discharge.  - Seroquel 100mg  QHS - Mirtazapine 7.5mg  daily - Hold home SSRI  Best practice:  DIET: regular IVF: n/a DVT PPX: lovenox BOWEL: miralax prn CODE: FULL FAM COM: n/a  , MD Internal Medicine Resident PGY-3 PAGER: [phone] 03/27/2022 10:49 AM  If after hours (below),  please contact on-call pager: [phone] 5PM-7AM Monday-Friday 1PM-7AM Saturday-Sunday

## 2022-03-28 DIAGNOSIS — L538 Other specified erythematous conditions: Secondary | ICD-10-CM

## 2022-03-28 DIAGNOSIS — E46 Unspecified protein-calorie malnutrition: Secondary | ICD-10-CM | POA: Diagnosis not present

## 2022-03-28 DIAGNOSIS — N739 Female pelvic inflammatory disease, unspecified: Secondary | ICD-10-CM | POA: Diagnosis not present

## 2022-03-28 DIAGNOSIS — E876 Hypokalemia: Secondary | ICD-10-CM | POA: Diagnosis not present

## 2022-03-28 LAB — BASIC METABOLIC PANEL
Anion gap: 11 (ref 5–15)
BUN: <5 mg/dL — ABNORMAL LOW (ref 6–20)
CO2: 24 mmol/L (ref 22–32)
Calcium: 8.7 mg/dL — ABNORMAL LOW (ref 8.9–10.3)
Chloride: 99 mmol/L (ref 98–111)
Creatinine, Ser: 0.62 mg/dL (ref 0.44–1.00)
GFR, Estimated: >60 mL/min (ref >60)
Glucose, Bld: 101 mg/dL — ABNORMAL HIGH (ref 70–99)
Potassium: 3.4 mmol/L — ABNORMAL LOW (ref 3.5–5.1)
Sodium: 134 mmol/L — ABNORMAL LOW (ref 135–145)

## 2022-03-28 LAB — MAGNESIUM: Magnesium: 1.5 mg/dL — ABNORMAL LOW (ref 1.7–2.4)

## 2022-03-28 LAB — GLUCOSE, CAPILLARY: Glucose-Capillary: 92 mg/dL (ref 70–99)

## 2022-03-28 MED ORDER — TRIAMCINOLONE ACETONIDE 0.1 % EX CREA
TOPICAL_CREAM | Freq: Three times a day (TID) | CUTANEOUS | Status: DC
  Administered 2022-03-29: 1 via TOPICAL
  Filled 2022-03-28: qty 15

## 2022-03-28 MED ORDER — HYDROMORPHONE HCL 1 MG/ML IJ SOLN
0.2500 mg | Freq: Four times a day (QID) | INTRAMUSCULAR | Status: DC | PRN
  Administered 2022-03-28 – 2022-03-31 (×11): 0.25 mg via INTRAVENOUS
  Filled 2022-03-28 (×11): qty 0.5

## 2022-03-28 MED ORDER — METOCLOPRAMIDE HCL 5 MG PO TABS
5.0000 mg | ORAL_TABLET | Freq: Four times a day (QID) | ORAL | Status: DC | PRN
  Administered 2022-03-28: 5 mg via ORAL
  Filled 2022-03-28: qty 1

## 2022-03-28 MED ORDER — METOCLOPRAMIDE HCL 5 MG/ML IJ SOLN
5.0000 mg | Freq: Four times a day (QID) | INTRAMUSCULAR | Status: DC | PRN
  Administered 2022-03-28: 5 mg via INTRAVENOUS
  Filled 2022-03-28: qty 2

## 2022-03-28 NOTE — Progress Notes (Addendum)
NAME:  Paula Lucas, MRN:  [id number], DOB:  03/25/1991, LOS: 9 ADMISSION DATE:  03/18/2022  Subjective  Patient evaluated at bedside this AM. She notes a pruritic, erythematous rash developing on her RUE and bilateral LE that she first noticed yesterday. She denies noticing any drainage from her rash. She denies ever having a rash similar to this previously. Discussed plan to try triamcinolone. Patient is also complaining of nausea and states that her zofran is not helping.   Objective   Blood pressure 108/69, pulse 90, temperature 98.2 F (36.8 C), temperature source Oral, resp. rate 18, last menstrual period 10/26/2021, SpO2 98 %, unknown if currently breastfeeding.    No intake or output data in the 24 hours ending 03/28/22 1319 There were no vitals filed for this visit.  Physical Exam: General: Patient resting comfortably in no acute distress CV: Regular rate, rhythm. No murmurs appreciated.  Pulm: Normal work of breathing on room air. Clear to ausculation bilaterally. Skin: Multiple pruritic, dime-sized, erythematous, non-blanching papules, some with pus-filled vesicles on RLE and bilateral LE. Few appear similar to urticaria. (See picture below) Neuro: Awake, alert, conversing appropriately. Grossly non-focal. Psych: Normal mood, affect, speech.     Labs       Latest Ref Rng & Units 03/24/2022    4:31 AM 03/23/2022    5:53 AM 03/22/2022    6:07 AM  CBC  WBC 4.0 - 10.5 K/uL 9.4  8.3  10.4   Hemoglobin 12.0 - 15.0 g/dL 53.6  14.4  31.5   Hematocrit 36.0 - 46.0 % 32.7  30.7  31.5   Platelets 150 - 400 K/uL 446  414  452       Latest Ref Rng & Units 03/28/2022    1:33 AM 03/27/2022    1:01 AM 03/26/2022    1:24 AM  BMP  Glucose 70 - 99 mg/dL 400  89  84   BUN 6 - 20 mg/dL <5  <5  <5   Creatinine 0.44 - 1.00 mg/dL 8.67  6.19  5.09   Sodium 135 - 145 mmol/L 134  135  136   Potassium 3.5 - 5.1 mmol/L 3.4  3.1  3.2   Chloride 98 - 111 mmol/L 99  99  99   CO2 22 - 32 mmol/L  24  27  25    Calcium 8.9 - 10.3 mg/dL 8.7  9.1  9.1     Summary  Paula Lucas is 31yo person with multiple intra-abdominal abscesses 2/2 pelvic inflammatory disease, schizoaffective disorder, bipolar disorder, major depressive disorder admitted 7/27 with worsening abdominal pain related to intra-abdominal abscesses, continuing to improve with titration of psychiatric medications.   Assessment & Plan:  Principal Problem:   Pelvic abscess  Active Problems:   Hypokalemia   Abdominal pain, left upper quadrant   Schizoaffective disorder, bipolar type (HCC)  #Subacute abdominal pain #Multiple intra-abdominal abscesses s/p washout, JP drain placement This morning Paula Lucas reports abdominal pain, although objectively she is laying in bed, comfortably, in no acute distress. HR normal. Earlier this morning, she refused oxycodone twice in lieu of IV dilaudid. I reminded Paula Lucas this morning that in order to leave the hospital she will need to be off of IV medication, and she can help do that by taking her oxycodone. I am going to continue the oxycodone and decrease the dose of dilaudid today. If we continue to have difficulties, we could consider switching to PO dilaudid. - Augmentin 875-125mg  twice daily through  8/11  - Decrease IV dilaudid 0.25mg  every 6 hours as needed - Continue oxycodone 10mg  every 4 hours  #Persistent hypokalemia, hypomagnesemia #Protein-calorie malnutrition Patient continues to eat well since addition of mirtazapine. Potassium slowly improving, expect these both to improve as her PO intake improves. - Daily BMP, Mg - Mirtazapine 7.5mg  daily  #Erythematous, non-blanching papules Lesions on her upper right arm and lower extremities appear similar to bug bites. No recent fevers, chills, or previous similar reactions. Concern for possible bed bugs, although she has been here since 7/27 - could have been hiding in clothing, although unclear. We will plan to give  triamcinolone cream for the itching. If needed, can give extra dose of Benadryl.  - Triamcinolone cream three times daily to affected areas - Contact precautions  #Schizoaffective disorder #Bipolar disorder #Major depression disorder Appreciate psychiatry team's assistance with medications. Will likely need antipsychotic prior to discharge, psychiatry team to re-evaluate tomorrow. - Seroquel 100mg  QHS - Mirtazapine 7.5mg  daily - Holding home SSRI  Best practice:  DIET: regular IVF: n/a DVT PPX: lovenox BOWEL: miralax prn CODE: FULL FAM COM: n/a  8/27, MD Internal Medicine Resident PGY-3 PAGER: [phone] 03/28/2022 1:19 PM  If after hours (below), please contact on-call pager: [phone] 5PM-7AM Monday-Friday 1PM-7AM Saturday-Sunday

## 2022-03-29 ENCOUNTER — Inpatient Hospital Stay (HOSPITAL_COMMUNITY): Payer: Medicaid Other

## 2022-03-29 ENCOUNTER — Encounter: Payer: Medicaid Other | Admitting: Obstetrics & Gynecology

## 2022-03-29 DIAGNOSIS — N739 Female pelvic inflammatory disease, unspecified: Secondary | ICD-10-CM | POA: Diagnosis not present

## 2022-03-29 DIAGNOSIS — E876 Hypokalemia: Secondary | ICD-10-CM | POA: Diagnosis not present

## 2022-03-29 DIAGNOSIS — R197 Diarrhea, unspecified: Secondary | ICD-10-CM

## 2022-03-29 DIAGNOSIS — F250 Schizoaffective disorder, bipolar type: Secondary | ICD-10-CM | POA: Diagnosis not present

## 2022-03-29 DIAGNOSIS — R112 Nausea with vomiting, unspecified: Secondary | ICD-10-CM

## 2022-03-29 DIAGNOSIS — L539 Erythematous condition, unspecified: Secondary | ICD-10-CM

## 2022-03-29 LAB — LACTIC ACID, PLASMA
Lactic Acid, Venous: 0.8 mmol/L (ref 0.5–1.9)
Lactic Acid, Venous: 0.7 mmol/L (ref 0.5–1.9)

## 2022-03-29 LAB — BASIC METABOLIC PANEL
Anion gap: 14 (ref 5–15)
BUN: 6 mg/dL (ref 6–20)
CO2: 22 mmol/L (ref 22–32)
Calcium: 9.4 mg/dL (ref 8.9–10.3)
Chloride: 101 mmol/L (ref 98–111)
Creatinine, Ser: 0.86 mg/dL (ref 0.44–1.00)
GFR, Estimated: >60 mL/min (ref >60)
Glucose, Bld: 88 mg/dL (ref 70–99)
Potassium: 4 mmol/L (ref 3.5–5.1)
Sodium: 137 mmol/L (ref 135–145)

## 2022-03-29 LAB — COMPREHENSIVE METABOLIC PANEL
ALT: 8 U/L (ref 0–44)
AST: 10 U/L — ABNORMAL LOW (ref 15–41)
Albumin: 2.8 g/dL — ABNORMAL LOW (ref 3.5–5.0)
Alkaline Phosphatase: 53 U/L (ref 38–126)
Anion gap: 16 — ABNORMAL HIGH (ref 5–15)
BUN: 7 mg/dL (ref 6–20)
CO2: 23 mmol/L (ref 22–32)
Calcium: 9.6 mg/dL (ref 8.9–10.3)
Chloride: 100 mmol/L (ref 98–111)
Creatinine, Ser: 0.85 mg/dL (ref 0.44–1.00)
GFR, Estimated: >60 mL/min (ref >60)
Glucose, Bld: 103 mg/dL — ABNORMAL HIGH (ref 70–99)
Potassium: 3.5 mmol/L (ref 3.5–5.1)
Sodium: 139 mmol/L (ref 135–145)
Total Bilirubin: 1.5 mg/dL — ABNORMAL HIGH (ref 0.3–1.2)
Total Protein: 7.5 g/dL (ref 6.5–8.1)

## 2022-03-29 LAB — CBC WITH DIFFERENTIAL/PLATELET
Abs Immature Granulocytes: 0.02 10*3/uL (ref 0.00–0.07)
Basophils Absolute: 0 10*3/uL (ref 0.0–0.1)
Basophils Relative: 0 %
Eosinophils Absolute: 0.2 10*3/uL (ref 0.0–0.5)
Eosinophils Relative: 3 %
HCT: 34.4 % — ABNORMAL LOW (ref 36.0–46.0)
Hemoglobin: 11.3 g/dL — ABNORMAL LOW (ref 12.0–15.0)
Immature Granulocytes: 0 %
Lymphocytes Relative: 28 %
Lymphs Abs: 2.1 10*3/uL (ref 0.7–4.0)
MCH: 28.3 pg (ref 26.0–34.0)
MCHC: 32.8 g/dL (ref 30.0–36.0)
MCV: 86 fL (ref 80.0–100.0)
Monocytes Absolute: 0.5 10*3/uL (ref 0.1–1.0)
Monocytes Relative: 7 %
Neutro Abs: 4.7 10*3/uL (ref 1.7–7.7)
Neutrophils Relative %: 62 %
Platelets: 444 10*3/uL — ABNORMAL HIGH (ref 150–400)
RBC: 4 MIL/uL (ref 3.87–5.11)
RDW: 16.9 % — ABNORMAL HIGH (ref 11.5–15.5)
WBC: 7.6 10*3/uL (ref 4.0–10.5)
nRBC: 0 % (ref 0.0–0.2)

## 2022-03-29 LAB — MAGNESIUM: Magnesium: 1.6 mg/dL — ABNORMAL LOW (ref 1.7–2.4)

## 2022-03-29 MED ORDER — HYDROMORPHONE HCL 2 MG PO TABS
4.0000 mg | ORAL_TABLET | Freq: Once | ORAL | Status: AC
  Administered 2022-03-29: 4 mg via ORAL
  Filled 2022-03-29: qty 2

## 2022-03-29 MED ORDER — PROCHLORPERAZINE EDISYLATE 10 MG/2ML IJ SOLN: 5.0000 mg | INTRAMUSCULAR | Status: DC | PRN

## 2022-03-29 MED ORDER — METOCLOPRAMIDE HCL 5 MG/ML IJ SOLN
5.0000 mg | Freq: Four times a day (QID) | INTRAMUSCULAR | Status: DC
  Administered 2022-03-29 (×2): 5 mg via INTRAVENOUS
  Filled 2022-03-29 (×2): qty 2

## 2022-03-29 MED ORDER — METOCLOPRAMIDE HCL 5 MG/ML IJ SOLN
5.0000 mg | Freq: Four times a day (QID) | INTRAMUSCULAR | Status: DC | PRN
Start: 2022-03-29 — End: 2022-03-29

## 2022-03-29 MED ORDER — METOCLOPRAMIDE HCL 5 MG/ML IJ SOLN
5.0000 mg | Freq: Once | INTRAMUSCULAR | Status: AC
  Administered 2022-03-29: 5 mg via INTRAVENOUS
  Filled 2022-03-29: qty 2

## 2022-03-29 MED ORDER — IOHEXOL 300 MG/ML  SOLN
100.0000 mL | Freq: Once | INTRAMUSCULAR | Status: AC | PRN
  Administered 2022-03-29: 100 mL via INTRAVENOUS

## 2022-03-29 MED ORDER — METOCLOPRAMIDE HCL 5 MG/ML IJ SOLN: 5.0000 mg | INTRAMUSCULAR | Status: DC | PRN
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
## 2022-03-29 NOTE — Progress Notes (Signed)
   03/29/22 1055  Clinical Encounter Type  Visited With Patient  Visit Type Follow-up;Spiritual support  Referral From Nurse  Consult/Referral To Chaplain   Chaplain responded to a spiritual consult for support. The patient, Paula Lucas, stated that this was not a good time. She believed that she had a stomach bug and just was not well. I express concern for her situation and invited her to reach out to her nurse if she felt better later in the day. Someone would return. This is my second attempt to meet with Aloha Gell and I am concerned that patient's mood is down. Theotis Barrio  [facility]  [phone]

## 2022-03-29 NOTE — Progress Notes (Signed)
Physical Therapy Treatment Patient Details Name: Paula Lucas MRN: [id number] DOB: 10-May-1991 Today's Date: 03/29/2022   History of Present Illness Pt is a 31 y/o female admitted secondary to increased abdominal pain from pelvic abscess. Recent lengthy admission for same. PMH includes  MDD, PTSD types and has a past medical history of tubo-ovarian abscess.    PT Comments    Pt progressing towards goals. Continues to require encouragement for mobility, but was more agreeable this session. Supervision for safety. Educated about benefits of mobility. Current recommendations appropriate. Will continue to follow acutely.     Recommendations for follow up therapy are one component of a multi-disciplinary discharge planning process, led by the attending physician.  Recommendations may be updated based on patient status, additional functional criteria and insurance authorization.  Follow Up Recommendations  No PT follow up     Assistance Recommended at Discharge Intermittent Supervision/Assistance  Patient can return home with the following Assistance with cooking/housework;Assist for transportation   Equipment Recommendations  None recommended by PT    Recommendations for Other Services       Precautions / Restrictions Precautions Precautions: Fall Restrictions Weight Bearing Restrictions: No     Mobility  Bed Mobility Overal bed mobility: Modified Independent                  Transfers Overall transfer level: Modified independent Equipment used: None                    Ambulation/Gait Ambulation/Gait assistance: Supervision Gait Distance (Feet): 550 Feet Assistive device: None Gait Pattern/deviations: Step-through pattern Gait velocity: Decreased     General Gait Details: Mild sway noted with ambulation, but no overt LOB. Supervision for safety. Encouragement required for OOB mobility.   Stairs             Wheelchair Mobility    Modified  Rankin (Stroke Patients Only)       Balance Overall balance assessment: Mild deficits observed, not formally tested                                          Cognition Arousal/Alertness: Awake/alert Behavior During Therapy: WFL for tasks assessed/performed Overall Cognitive Status: Within Functional Limits for tasks assessed                                          Exercises      General Comments        Pertinent Vitals/Pain Pain Assessment Pain Assessment: Faces Faces Pain Scale: Hurts little more Pain Location: abdomen Pain Descriptors / Indicators: Constant, Guarding Pain Intervention(s): Limited activity within patient's tolerance, Monitored during session, Repositioned    Home Living                          Prior Function            PT Goals (current goals can now be found in the care plan section) Acute Rehab PT Goals Patient Stated Goal: to go home PT Goal Formulation: With patient Time For Goal Achievement: 04/08/22 Potential to Achieve Goals: Good Progress towards PT goals: Progressing toward goals    Frequency    Min 1X/week      PT Plan Current plan remains appropriate  Co-evaluation              AM-PAC PT "6 Clicks" Mobility   Outcome Measure  Help needed turning from your back to your side while in a flat bed without using bedrails?: None Help needed moving from lying on your back to sitting on the side of a flat bed without using bedrails?: None Help needed moving to and from a bed to a chair (including a wheelchair)?: None Help needed standing up from a chair using your arms (e.g., wheelchair or bedside chair)?: None Help needed to walk in hospital room?: None Help needed climbing 3-5 steps with a railing? : A Little 6 Click Score: 23    End of Session   Activity Tolerance: Patient tolerated treatment well Patient left: in chair;with call bell/phone within reach Nurse  Communication: Mobility status PT Visit Diagnosis: Other abnormalities of gait and mobility (R26.89)     Time: 1250-1311 PT Time Calculation (min) (ACUTE ONLY): 21 min  Charges:  $Therapeutic Exercise: 8-22 mins                     Cindee Salt, DPT  Acute Rehabilitation Services  Office: [phone]    [facility] Prom 03/29/2022, 1:25 PM

## 2022-03-29 NOTE — Progress Notes (Signed)
Subjective:   Summary: Paula Lucas is a 31 y.o. year old female currently admitted on the IMTS HD#10 for intra-abdominal abscesses and malnutrition.  Overnight Events: No overnight events   Pt states she has developed nausea and vomiting over the weekend. She denies any blood in her vomit, and is unsure how much she is actually vomiting. She also endorses diarrhea and red papules on her bilateral LE and URE.   Objective:  Vital signs in last 24 hours: Vitals:   03/28/22 1654 03/28/22 2034 03/29/22 0541 03/29/22 0826  BP: 128/86 (!) 125/100 111/73 113/83  Pulse: 100 (!) 110 (!) 101 (!) 106  Resp: 16 20 18 16   Temp: 98.3 F (36.8 C) 97.9 F (36.6 C) 98 F (36.7 C) 98.1 F (36.7 C)  TempSrc: Oral Oral Oral Oral  SpO2: 98% 99% 100% 100%   Supplemental O2: Room Air SpO2: 100 %   Physical Exam:  Constitutional:Young female laying in bed, in no acute distress Cardiovascular: RRR, no murmurs, rubs or gallops Pulmonary/Chest: normal work of breathing on room air, lungs clear to auscultation bilaterally Abdominal: soft, Tenderness on R side of abdomen, non-distended, dressing over incision sites where JP drains were Skin: warm and dry Extremities: No lower extremity edema present, RUE rash still present with erythema, bilateral LE red itchy papules  There were no vitals filed for this visit.   Intake/Output Summary (Last 24 hours) at 03/29/2022 1100 Last data filed at 03/28/2022 2300 Gross per 24 hour  Intake 200 ml  Output --  Net 200 ml   Net IO Since Admission: -3,324.87 mL [03/29/22 1100]  Pertinent Labs:    Latest Ref Rng & Units 03/24/2022    4:31 AM 03/23/2022    5:53 AM 03/22/2022    6:07 AM  CBC  WBC 4.0 - 10.5 K/uL 9.4  8.3  10.4   Hemoglobin 12.0 - 15.0 g/dL 03/24/2022  93.2  67.1   Hematocrit 36.0 - 46.0 % 32.7  30.7  31.5   Platelets 150 - 400 K/uL 446  414  452        Latest Ref Rng & Units 03/29/2022    6:22 AM 03/28/2022    1:33 AM 03/27/2022     1:01 AM  CMP  Glucose 70 - 99 mg/dL 88  05/27/2022  89   BUN 6 - 20 mg/dL 6  <5  <5   Creatinine 0.44 - 1.00 mg/dL 809  9.83  3.82   Sodium 135 - 145 mmol/L 137  134  135   Potassium 3.5 - 5.1 mmol/L 4.0  3.4  3.1   Chloride 98 - 111 mmol/L 101  99  99   CO2 22 - 32 mmol/L 22  24  27    Calcium 8.9 - 10.3 mg/dL 9.4  8.7  9.1     Assessment/Plan:   Principal Problem:   Pelvic abscess  Active Problems:   Hypokalemia   Abdominal pain, left upper quadrant   Schizoaffective disorder, bipolar type (HCC)   Patient Summary: Paula Lucas is a 31 y.o. with a pertinent PMH of intra-abdominal abscesses secondary to PID, malnourishment, schizoaffective disorder, bipolar disorder, major depressive disorder, who presented with abdominal pain and admitted for recurrent intra-abdominal abscesses secondary to PID. She was initially admitted to the hospital on 6/19 and stayed until 7/9 for severe sepsis and multiple pelvic abscesses. At that time, she underwent  ex-lap with washout. She was then discharged, and readmitted on 7/22 for fever and abdominal pain, then discharged on 7/25 with cefadroxil and flagyl. She then presented again on 7/27 with worsening abdominal pain.    #Abdominal Pain w/ Hx of Pelvic Abscesses s/p washout with JP Drain Placement Pt states her pain is getting better. Last night we decreased her IV dilauded to .25, and today we will do a trial of instead of her oxycodone 10mg  PO, we will have her take Dilaudid 4mg  (the equivalent) PO to determine which medication works better for her with the intention of discharging her on a short term form of that medication. Abdomen is still tender to palpation. She is starting to ambulate more as well. She is still taking Augmentin until her follow up with infectious disease on 8/11.   Plan:  - Continue encouraging patient to go on walks - Slowly wean her off IV pain medications  - Continue Augmentin   #Hypokalemia/Hypomagnesemia in the  setting Malnutrition Potassium today was 3.4, and Mg was 1.4. She is not complaining of muscle cramps anymore. She is still on the IV potassium, IV magnesium, spironolactone, and mirtazapine 7.5mg  to help stimulate appetite. She denied her meal last night because she didn't like how it tasted, but continues to be interested in eating.  Plan:  - Monitor Potassium and Mg levels  - Continue encouraging patient to eat   #Depression/Schizoaffective Disorder/Bipolar Disorder  Psychiatry is currently following the patient, and have decided to keep her on Remeron 7.5mg  and have added Seroquel 100 mg. Currently considering starting Abilify today, will anticipate communication with psych. Plan: -Appreciate psychiatry recommendation    #Erythematous non-blanching papules  Lesions on her upper right arm and bilateral lower extremities seem to be similar to bug bites. Denies any fevers, chills. Due to her history of urinating in her bed, could be likely her bed may have ants in them. She says her itchiness has improved since yesterday due to Triamcinolone cream and Benadryl PRN. - Continue current medical regimen  - Will D/C contact precautions as bed bugs are not likely at this point  - Ordered a GC/Chlamydia self swab test to help identify cause   #Vomiting/Dry Heaving/Diarrhea Pt states over the weekend she developed nausea and vomiting. Intern working the night shift stated he didn't see any vomit, and was more dry heaving. Regardless, asked nurse to monitor vomiting output. This could be due to her lack of consumption of food. Nurse reports pt vomiting about 150-200cc.   - Reglan PRN  - Will monitor fluid levels   Diet: Normal IVF: None,None VTE: Enoxaparin Code: Full   Dispo: Expected length of stay more than 2 days in hospital  , MD PGY-1 Internal Medicine Resident Pager Number [phone] Please contact the on call pager after 5 pm and on weekends at [phone].

## 2022-03-29 NOTE — Progress Notes (Signed)
CLINICAL UPDATE:  Messaged by RN that patient has had multiple episodes of emesis today with ongoing abdominal pain. I evaluated the patient at bedside this evening. She reports her pain has worsened throughout the day and she has not been able to keep food down. Mentions that the PO dilaudid also made her nauseous. No changes in bowel movements, fevers, chills.   On exam, she appears uncomfortable in mild distress. She is tachycardic, regular rhythm without murmurs. Abdomen is exquisitely tender to light palpation throughout. Guarding present. Abdomen does appear soft and non-distended.  [name] is a 31yo person admitted here with subacute abdominal pain due to intra-abdominal abscesses. This morning we were concerned for possible psychogenic causes of her nausea, but per nursing she has had multiple episodes of emesis this afternoon. Her current clinical state is a change from this morning, when we were able to deeply palpate with minimal symptoms. . Her abdomen is tender throughout, I am concerned for worsening infection in setting of known abscesses. Thankfully, she has remained afebrile and hemodynamically stable. We are going to re-image her and get a full set of lab work this evening. We will have our night team follow-up these results.  - CBC w/ diff, CMP, lactate pending - CT abdomen/pelvis with contrast   Evlyn Kanner, MD Internal Medicine PGY-3 Pager: [phone]

## 2022-03-30 DIAGNOSIS — E876 Hypokalemia: Secondary | ICD-10-CM | POA: Diagnosis not present

## 2022-03-30 DIAGNOSIS — N739 Female pelvic inflammatory disease, unspecified: Secondary | ICD-10-CM | POA: Diagnosis not present

## 2022-03-30 DIAGNOSIS — F250 Schizoaffective disorder, bipolar type: Secondary | ICD-10-CM | POA: Diagnosis not present

## 2022-03-30 LAB — GC/CHLAMYDIA PROBE AMP (~~LOC~~) NOT AT ARMC
Chlamydia: NEGATIVE
Comment: NEGATIVE
Comment: NORMAL
Neisseria Gonorrhea: NEGATIVE

## 2022-03-30 LAB — BASIC METABOLIC PANEL
Anion gap: 13 (ref 5–15)
BUN: 7 mg/dL (ref 6–20)
CO2: 23 mmol/L (ref 22–32)
Calcium: 9.3 mg/dL (ref 8.9–10.3)
Chloride: 101 mmol/L (ref 98–111)
Creatinine, Ser: 0.69 mg/dL (ref 0.44–1.00)
GFR, Estimated: >60 mL/min (ref >60)
Glucose, Bld: 86 mg/dL (ref 70–99)
Potassium: 3.2 mmol/L — ABNORMAL LOW (ref 3.5–5.1)
Sodium: 137 mmol/L (ref 135–145)

## 2022-03-30 LAB — MAGNESIUM: Magnesium: 1.6 mg/dL — ABNORMAL LOW (ref 1.7–2.4)

## 2022-03-30 MED ORDER — POTASSIUM CHLORIDE 10 MEQ/100ML IV SOLN
10.0000 meq | Freq: Every day | INTRAVENOUS | Status: DC
  Administered 2022-03-30 – 2022-03-31 (×2): 10 meq via INTRAVENOUS
  Filled 2022-03-30 (×2): qty 100

## 2022-03-30 MED ORDER — KETOROLAC TROMETHAMINE 15 MG/ML IJ SOLN
7.5000 mg | Freq: Four times a day (QID) | INTRAMUSCULAR | Status: DC | PRN
Start: 2022-03-30 — End: 2022-03-31
  Administered 2022-03-30 – 2022-03-31 (×3): 7.5 mg via INTRAVENOUS
  Filled 2022-03-30 (×3): qty 1

## 2022-03-30 MED ORDER — SODIUM CHLORIDE 0.9 % IV SOLN: INTRAVENOUS | Status: DC

## 2022-03-30 MED ORDER — SODIUM CHLORIDE 0.9 % IV BOLUS
1000.0000 mL | Freq: Once | INTRAVENOUS | Status: AC
  Administered 2022-03-30: 1000 mL via INTRAVENOUS

## 2022-03-30 MED ORDER — MAGNESIUM SULFATE 2 GM/50ML IV SOLN
2.0000 g | Freq: Once | INTRAVENOUS | Status: AC
Start: 2022-03-30 — End: 2022-03-30
  Administered 2022-03-30: 2 g via INTRAVENOUS
  Filled 2022-03-30: qty 50

## 2022-03-30 NOTE — Progress Notes (Signed)
Subjective:   Summary: Paula Lucas is a 31 y.o. year old female currently admitted on the IMTS HD#11 for for intra-abdominal abscesses and malnutrition.  Overnight Events: Pt was complaining of nausea after dilaudid use was complaining of severe abdominal pain. She vomited three times last night. CT of abdomen was ordered   Pt states that she is feeling much better at the moment, although still endorses abdominal pain and nausea.   Objective:  Vital signs in last 24 hours: Vitals:   03/29/22 1843 03/29/22 2000 03/30/22 0637 03/30/22 0835  BP: 123/77 (!) 118/90 113/85 106/80  Pulse: (!) 107 96 (!) 110 (!) 102  Resp: 16 17 16 15   Temp: 98.9 F (37.2 C) 98 F (36.7 C) 98.1 F (36.7 C) 98.2 F (36.8 C)  TempSrc: Oral Oral Oral Oral  SpO2: 99% 99% 98% 97%   Supplemental O2: Room Air SpO2: 97 %   Physical Exam:  Constitutional: Young female laying in bed, in no acute distress  Cardiovascular: RRR, no murmurs, rubs or gallops Pulmonary/Chest: normal work of breathing on room air, lungs clear to auscultation bilaterally Abdominal: Tenderness on R side of abdomen, non-distended, dressing over incision sites where JP drains were Skin: warm and dry Extremities: upper/lower extremity pulses 2+, RUE rash still present with erythema, bilateral LE red itchy papules  There were no vitals filed for this visit.   Intake/Output Summary (Last 24 hours) at 03/30/2022 1354 Last data filed at 03/30/2022 0935 Gross per 24 hour  Intake 75 ml  Output --  Net 75 ml   Net IO Since Admission: -3,249.87 mL [03/30/22 1354]  Pertinent Labs:    Latest Ref Rng & Units 03/29/2022    7:03 PM 03/24/2022    4:31 AM 03/23/2022    5:53 AM  CBC  WBC 4.0 - 10.5 K/uL 7.6  9.4  8.3   Hemoglobin 12.0 - 15.0 g/dL 05/23/2022  84.1  66.0   Hematocrit 36.0 - 46.0 % 34.4  32.7  30.7   Platelets 150 - 400 K/uL 444  446  414        Latest Ref Rng & Units 03/30/2022    1:51 AM 03/29/2022    7:03 PM  03/29/2022    6:22 AM  CMP  Glucose 70 - 99 mg/dL 86  05/29/2022  88   BUN 6 - 20 mg/dL 7  7  6    Creatinine 0.44 - 1.00 mg/dL 160   1.09   Sodium 135 - 145 mmol/L 137  139  137   Potassium 3.5 - 5.1 mmol/L 3.2  3.5  4.0   Chloride 98 - 111 mmol/L 101  100  101   CO2 22 - 32 mmol/L 23  23  22    Calcium 8.9 - 10.3 mg/dL 9.3  9.6  9.4   Total Protein 6.5 - 8.1 g/dL  7.5    Total Bilirubin 0.3 - 1.2 mg/dL  1.5    Alkaline Phos 38 - 126 U/L  53    AST 15 - 41 U/L  10    ALT 0 - 44 U/L  8        Imaging: CT ABDOMEN PELVIS W CONTRAST  Result Date: 03/29/2022 CLINICAL DATA:  Acute abdominal pain. Concern for worsening infection in the setting of known abdominal abscesses. EXAM: CT ABDOMEN AND PELVIS WITH CONTRAST TECHNIQUE: Multidetector CT imaging of the abdomen  and pelvis was performed using the standard protocol following bolus administration of intravenous contrast. RADIATION DOSE REDUCTION: This exam was performed according to the departmental dose-optimization program which includes automated exposure control, adjustment of the mA and/or kV according to patient size and/or use of iterative reconstruction technique. CONTRAST:  OMNIPAQUE IOHEXOL 300 MG/ML  SOLN COMPARISON:  CTs with contrast dated 03/18/2022, 03/13/2022 and 03/02/2022. FINDINGS: Lower chest: There are scattered atelectatic bands in the lower lobes without focal consolidation. There is a small layering right pleural effusion with improvement, and interval resolution of minimal left pleural effusion noted previously. The cardiac size is normal. Hepatobiliary: 16.6 cm length liver again noted with mild steatosis. There is no mass enhancement. There is a 9 mm cyst in the tip of the right lobe again noted. There are tiny stones layering in the distal gallbladder but no wall thickening or biliary dilatation. Pancreas: No focal abnormality or inflammatory changes. Spleen: No focal abnormality.  No splenomegaly. Adrenals/Urinary Tract:  No adrenal or renal cortical mass enhancement. There is no urinary stone or obstruction. There is interval increased perivesical stranding. The bladder appears thickened but is not fully distended. Stomach/Bowel: There is worsening haziness in the mesentery in the mid to lower abdomen more so on the right concern for peritonitis, otherwise could be congestive edema but the cardiac size is normal. The stomach and upper abdominal small bowel are unremarkable but there is increased diffuse thickening of folds in the mid to lower abdominal small bowel and ascending colon, and increased fluid levels with increased distention of some of the thickened lower abdominal small bowel segments to 2.8 cm. There is also increasing wall thickening or underdistention in the distal descending colon whereas the rectosigmoid segments are relatively normal thickness. There is also increased peritoneal enhancement in the pelvis. Vascular/Lymphatic: Unremarkable aorta and hepatic portal vein. Prominent lymph nodes along the mesenteric root, retroperitoneum and both external iliac chains are redemonstrated unchanged with no new adenopathy. Reproductive: Left adnexal rim enhancing cyst measures 3.4 x 3 cm, but measuring larger on 03/13/2022 at 4.2 x 3 cm, stable since last CT. The uterus is intact. Right adnexal structures are unremarkable as visualized but mostly obscured by overlapping structures. Other: Stable small fluid collection anterior to the rectum eccentric to the right measuring 1.5 cm on 3:75. Interval removal of prior surgical drains with enhancing curvilinear intra-abdominal tracts in the previous location of these drains, anterior right mid abdomen at the umbilical level and right lower abdominopelvic quadrant anteriorly. There is no localizing collection or fistula visible associated with either tract. As above there is increased mesenteric edema and pelvic peritoneal enhancement concerning for peritonitis but no free air  or new fluid collection, with scattered mild interloop fluid in the mesentery in the abdomen and upper pelvis, small amount of pelvic free fluid in the posterior gutters. There is minimal fluid collecting along the paracolic gutters as well. The amount of free fluid appears no greater than previously despite the increased stranding edema. There is mild body wall edema in the abdomen but less than previously. Musculoskeletal: No new abnormality or acute osseous abnormality. Again noted is a moderate-sized left paracentral L5-S1 disc herniation posteriorly displacing the left S1 nerve root. IMPRESSION: 1. Interval removal of 2 surgical drains from the right anterior abdomen. There are enhancing tracts in the previous location of these tubes but no localizing fluid collection. 2. Interval worsening mesenteric edema and pelvic peritoneal enhancement worrisome for worsening peritonitis. No interval change in the abdominal  and pelvic interloop fluid and small amount of free pelvic fluid. 3. Increasing mucosal thickening in the mid to lower abdominal small bowel and ascending colon, possibly also in the distal descending colon, findings consistent with worsening ileocolitis. The appendix could not be seen separately from the inflammatory changes and matted small bowel in the area. 4. Increased dilatation to just under 3 cm some of the inflamed small bowel segments which could indicate a low-grade obstructive component or ileus. 5. Increased thickening of the bladder with adjacent stranding, unclear if this is reactive due to the pelvic and abdominal inflammatory process or due to interval developed cystitis. 6. 3.4 x 3 cm thin walled left adnexal cystic structure, was slightly larger on 03/13/2022 but unchanged since 03/18/2022, possible ovarian cyst. 7. Stable 1.5 cm rim enhancing fluid collection anterior to the rectum. 8. Stable adenopathy likely reactive. 9. Small right pleural effusion with improvement, interval  resolution of minimal left pleural effusion seen previously, with lower lobe atelectasis. 10. L5-S1 left paracentral disc protrusion with mass effect on the left S1 nerve root. Electronically Signed   By: Almira Bar M.D.   On: 03/29/2022 22:29     Assessment/Plan:   Principal Problem:   Pelvic abscess  Active Problems:   Hypokalemia   Abdominal pain, left upper quadrant   Schizoaffective disorder, bipolar type [facility])   Patient Summary: MALIN SAMBRANO is a 31 y.o. with a pertinent PMH of intra-abdominal abscesses secondary to PID, malnourishment, schizoaffective disorder, bipolar disorder, major depressive disorder, who presented with abdominal pain and admitted for recurrent intra-abdominal abscesses secondary to PID. She was initially admitted to the hospital on 6/19 and stayed until 7/9 for severe sepsis and multiple pelvic abscesses. At that time, she underwent ex-lap with washout. She was then discharged, and readmitted on 7/22 for fever and abdominal pain, then discharged on 7/25 with cefadroxil and flagyl. She then presented again on 7/27 with worsening abdominal pain.     #Abdominal Pain w/ Hx of Pelvic Abscesses s/p Washout with JP Drain Placement  Last night, patient was complaining of severe pain in her abdomen, nausea and vomiting. CT scan revealed what could be a new ileus forming, and OB was consulted to read the CT just to rule out any gyn causes and they confirmed there was nothing to do on their end.  CBC was normal, as well as lactate levels.   Plan:  - We will keep her on bowel rest and clear liquid diet  - IV Fluids    #Hypokalemia/Hypomagnesemia in the setting of Malnutrition Potassium today was 3.2, and magnesium level was 1.6, currently still below target however patient had multiple vomiting episodes yesterday so this could have set her back. She is not tolerating anything PO at the moment, so we are continuing with IV potassium and IV magnesium.   Plan:  -IV  Potassium, IV magnesium  -Check daily BMPs  #Depression/Schizoaffective Disorder/Bipolar Disorder Pt is currently on Remeron 7.5 mg qHS, Seroquel 100 mg qHS. Psych has recommended Seroquel or Abilify monotherapy instead of dual therapy, even though patient endorses both have worked for her in the past. Psych has signed off on the patient, and has recommended to follow up outpatient upon discharge.   Plan:  -Continue medical regimen   #Erythematous Non-Blanching Papules  Patient continues to have lesions on her upper right arm and bilateral lower extremities. They look markedly better than yesterday. Will continue with the tramcinolone cream and benadryl PRN. Still awaiting results for the  Gonorrhea and Chlamydia self swab      Diet: Clear Liquid  IVF: NS,10cc/hr VTE: Enoxaparin Code: Full    Dispo: Expected Length of stay more than 2 days in hospital.  Olegario Messier, MD PGY-1 Internal Medicine Resident Pager Number [phone] Please contact the on call pager after 5 pm and on weekends at [phone].

## 2022-03-31 ENCOUNTER — Other Ambulatory Visit (HOSPITAL_COMMUNITY): Payer: Self-pay

## 2022-03-31 ENCOUNTER — Ambulatory Visit: Payer: Medicaid Other | Admitting: Obstetrics and Gynecology

## 2022-03-31 DIAGNOSIS — N739 Female pelvic inflammatory disease, unspecified: Secondary | ICD-10-CM | POA: Diagnosis not present

## 2022-03-31 DIAGNOSIS — F250 Schizoaffective disorder, bipolar type: Secondary | ICD-10-CM | POA: Diagnosis not present

## 2022-03-31 DIAGNOSIS — E876 Hypokalemia: Secondary | ICD-10-CM | POA: Diagnosis not present

## 2022-03-31 LAB — BASIC METABOLIC PANEL
Anion gap: 10 (ref 5–15)
BUN: 8 mg/dL (ref 6–20)
CO2: 23 mmol/L (ref 22–32)
Calcium: 9.1 mg/dL (ref 8.9–10.3)
Chloride: 104 mmol/L (ref 98–111)
Creatinine, Ser: 0.75 mg/dL (ref 0.44–1.00)
GFR, Estimated: >60 mL/min (ref >60)
Glucose, Bld: 72 mg/dL (ref 70–99)
Potassium: 4.2 mmol/L (ref 3.5–5.1)
Sodium: 137 mmol/L (ref 135–145)

## 2022-03-31 LAB — MAGNESIUM: Magnesium: 1.9 mg/dL (ref 1.7–2.4)

## 2022-03-31 MED ORDER — MIRTAZAPINE 7.5 MG PO TABS
7.5000 mg | ORAL_TABLET | Freq: Every day | ORAL | 0 refills | Status: DC
  Filled 2022-03-31: qty 30, 30d supply, fill #0

## 2022-03-31 MED ORDER — OXYCODONE HCL 10 MG PO TABS: 10.0000 mg | ORAL_TABLET | ORAL | 0 refills | Status: DC

## 2022-03-31 MED ORDER — AMOXICILLIN-POT CLAVULANATE 875-125 MG PO TABS
1.0000 | ORAL_TABLET | Freq: Two times a day (BID) | ORAL | 0 refills | Status: DC
  Filled 2022-03-31: qty 4, 2d supply, fill #0

## 2022-03-31 MED ORDER — QUETIAPINE FUMARATE 100 MG PO TABS
100.0000 mg | ORAL_TABLET | Freq: Every day | ORAL | 0 refills | Status: DC
Start: 2022-03-31 — End: 2024-01-28
  Filled 2022-03-31: qty 30, 30d supply, fill #0

## 2022-03-31 MED ORDER — SPIRONOLACTONE 25 MG PO TABS
12.5000 mg | ORAL_TABLET | Freq: Two times a day (BID) | ORAL | 0 refills | Status: DC
  Filled 2022-03-31: qty 30, 30d supply, fill #0

## 2022-03-31 NOTE — Discharge Summary (Signed)
Name: Paula Lucas MRN: [id number] DOB: 18-Jul-1991 31 y.o. PCP: Paula Pier, MD  Date of Admission: 03/18/2022  5:13 AM Date of Discharge: 03/31/2022 Attending Physician: Paula Mussel, MD  Discharge Diagnosis: 1. Principal Problem:   Pelvic abscess  Active Problems:   Hypokalemia   Abdominal pain, left upper quadrant   Schizoaffective disorder, bipolar type [facility])   Discharge Medications: Allergies as of 03/31/2022       Reactions   Fish Allergy Anaphylaxis   Severe allergic reaction to all seafoods   Motrin [ibuprofen] Anaphylaxis   Pt tolerated Ketorolac 04/02/18 Pt tolerated toradol during hosp admission in July 2023   Mushroom Extract Complex Anaphylaxis   Shellfish-derived Products Anaphylaxis   Tylenol [acetaminophen] Anaphylaxis        Medication List     STOP taking these medications    cyclobenzaprine 5 MG tablet Commonly known as: FLEXERIL   furosemide 40 MG tablet Commonly known as: LASIX   gabapentin 300 MG capsule Commonly known as: NEURONTIN   hydrOXYzine 10 MG tablet Commonly known as: ATARAX   metroNIDAZOLE 500 MG tablet Commonly known as: FLAGYL   naloxone 4 MG/0.1ML Liqd nasal spray kit Commonly known as: NARCAN   pantoprazole 40 MG tablet Commonly known as: PROTONIX   potassium chloride 10 MEQ tablet Commonly known as: KLOR-CON M   prochlorperazine 10 MG tablet Commonly known as: COMPAZINE   traZODone 50 MG tablet Commonly known as: DESYREL   valACYclovir 1000 MG tablet Commonly known as: VALTREX       TAKE these medications    amoxicillin-clavulanate 875-125 MG tablet Commonly known as: AUGMENTIN Take 1 tablet by mouth every 12 (twelve) hours.   mirtazapine 7.5 MG tablet Commonly known as: REMERON Take 1 tablet (7.5 mg total) by mouth at bedtime.   Oxycodone HCl 10 MG Tabs Take 1 tablet (10 mg total) by mouth every 4 (four) hours. What changed:  medication strength how much to take when to take  this reasons to take this   QUEtiapine 100 MG tablet Commonly known as: SEROQUEL Take 1 tablet (100 mg total) by mouth at bedtime.   spironolactone 25 MG tablet Commonly known as: ALDACTONE Take 0.5 tablets (12.5 mg total) by mouth 2 (two) times daily.               Durable Medical Equipment  (From admission, onward)           Start     Ordered   03/26/22 1535  For home use only DME Other see comment  Once       Comments: Dressing supplies   Patient needs 16  4x4 gauze pads , 2 ABD pads and normal saline daily for dressing changes  Question:  Length of Need  Answer:  6 Months   03/26/22 1536              Discharge Care Instructions  (From admission, onward)           Start     Ordered   03/31/22 0000  Change dressing (specify)       Comments: Dressing change: 1 times per day using gauze.   03/31/22 1114            Disposition and follow-up:   [name] was discharged from [facility] in Good condition.  At the hospital follow up visit please address:  1.  Her current pain levels, any signs of infection, her wound from JP  drains on abdomen, her current diet and if her appetite has gotten better. And please sign out to Dr. Cain Lucas if she is available.   2.  Labs / imaging needed at time of follow-up: BMP (specifically potassium levels), Mg. If she is complaining of abdominal pain reasonable to obtain CT Abdomen   3.  Pending labs/ test needing follow-up: N/A Follow-up Appointments:  Internal Medicine Clinic at [facility] Thursday, 04/08/22 at 3:45 PM    Hospital Course by problem list: #Abdominal Pain w/ Hx of Pelvic Abscesses s/p washout with JP Drain Placement  JP Drains were placed and removed on 7/30. Her pain regimen has been difficult to control, as she is very specific with her medication. On this admission, initially her pain was being controlled with Oxycodone 97m Q4H, and IV Dilaudid .583mfor breakthrough  pain. Pt was still complaining of severe pain despite these efforts, so we added on duloxetine 3081mand gabapentin 300 mg 3 times, and Toradol Q6H. We then switched her pain medication back to PO Oxycodone 29m58mH, and Dilaudid .5mg 24m with the intention of slowly reducing the dose of the IV Dilaudid. We gave her a trial of dilaudid instead of oxycodone, and are currently waiting to see which one is more effective for her.   Pt complained of severe abdominal pain and nausea/vomiting on 8/7. She had three episodes of emesis accompanied with severe pain. Examination was done which revealed a very tender abdomen, a CT scan was done out of worry for worsening infection. CT revealed potential ileus formation. IV Toradol was given for pain relief, and OB was also consulted as they were involved in the washout and exlap initially. OB/GYN signed out, and said there was nothing for them to do at the moment, and symptoms resolved the next day. Her vitals were within normal limits during this episode, CBC w/diff and Lactate were all normal as well.   Her infection was treated with IV Cefepime and Flagyl initially however she was still spiking fevers occasionally, and she was also not compliant with a lot of her medications, denying the lot of the them. In efforts to reduce the amount of Ivs/pills she was on to help increase compliance, we changed her IV Cefepime and Flagyl to Augmentin 875-125. She has not spiked a fever since. Continue Augmentin until 8/11.  #Hypokalemia/Hypomagnesemia In the Setting of Malnutrition Potassium levels of fluctuated since arrival, lowest level being 2.7 and highest being 3.5. Magnesium levels of also fluctuated, lowest level being 1.3, highest being 1.5. These are most likely in the setting of poor nutrition and lack of appetite. To correct this, we initially started with IV potassium and magnesium, however patient would decline intermittently, due to a burning feeling she would get  when potassium would enter. Dilution of potassium did not help either, as she still complained of burning.  We then discussed PO potassium and magnesium, and patient was agreeable at the time. She continued to decline medications. Feeding tube placement was on the table, however we prescribed mirtazapine 7.5mg a44mher appetite started returning. We also added spironolactone to her medical regimen for the potassium sparing effect. She started eating and craving food.   She also refused to ambulate her first 4-5 days in the hospital. Physical therapy was able to motivate her enough to ambulate, and she has been getting up to use the restroom, and also taking laps around the floor with nurse and PT assistance.   #Depression/Schizoaffective Disorder/Bipolar Disorder  On August  2nd, we were able to get a more robust psychiatric history for her, which revealed she had been diagnosed previously with Major Depressive Disorder, Schizoaffective disorder and Bipolar Disorder. She had been seeing an outpatient psychiatrist at [facility], but she hasn't been on any of her medications in 2 months since she had been in and out of the hospital. She was previously on the Abilify shot 63m, Celexa, and Seroquel. We put in a psychiatric consult and started Seroquel 516m Psych then recommended to increase her seroquel to 10038mPsychiatry recommended outpatient follow up, and would like to keep her on either Seroquel or Abilify, but preferably not both. Will advise [facility] #Rash Pt was complaining of itchiness and rash on her RUE and Bilateral lower extremities. Multiple small non-blanching papules, which looked like bug bites, as patient was immobile and would urinate in the bed frequently. She was treated with triamcinolone cream and benadryl PRN, and rash improved upon discharge.   Discharge Exam:   BP (!) 127/95 (BP Location: Right Arm)   Pulse 78   Temp 98.4 F (36.9 C) (Oral)    Resp 16   SpO2 98%  Discharge exam:   Constitutional: Young female laying in bed, no acute distress  Cardiovascular: RRR, no murmurs, rubs or gallops  Pulmonary/Chest: Normal work of breathing on room air, lungs clear to auscultation bilaterally  Abdominal: Tenderness on R side of abdomen, non-distended, dressing over incision sites where JP drains were in place  Skin: Warm and Dry  Extremities: Upper/lower extremity pulses 2+, RUE rash has improved, as well as bilateral LE rash   Pertinent Labs, Studies, and Procedures:     Latest Ref Rng & Units 03/31/2022    9:17 AM 03/30/2022    1:51 AM 03/29/2022    7:03 PM  BMP  Glucose 70 - 99 mg/dL 72  86  103   BUN 6 - 20 mg/dL _0 Creatinine 0.44 - 1.00 mg/dL 0.75  0.69  0.85   Sodium 135 - 145 mmol/L 137  137  139   Potassium 3.5 - 5.1 mmol/L 4.2  3.2  3.5   Chloride 98 - 111 mmol/L 104  101  100   CO2 22 - 32 mmol/L _1 Calcium 8.9 - 10.3 mg/dL 9.1  9.3  9.6        Latest Ref Rng & Units 03/29/2022    7:03 PM 03/24/2022    4:31 AM 03/23/2022    5:53 AM  CBC  WBC 4.0 - 10.5 K/uL 7.6  9.4  8.3   Hemoglobin 12.0 - 15.0 g/dL 11.3  10.7  10.0   Hematocrit 36.0 - 46.0 % 34.4  32.7  30.7   Platelets 150 - 400 K/uL 444  446  414      Discharge Instructions: Discharge Instructions     Call MD for:  severe uncontrolled pain   Complete by: As directed    Call MD for:  temperature >100.4   Complete by: As directed    Change dressing (specify)   Complete by: As directed    Dressing change: 1 times per day using gauze.   Diet - low sodium heart healthy   Complete by: As directed    Increase activity slowly   Complete by: As directed        Signed: NooDrucie OpitzD 03/31/2022, 11:21 AM   Pager: [phone]

## 2022-03-31 NOTE — Discharge Instructions (Addendum)
You were admitted for abdominal pain  and infection secondary to the abscesses you had inside of your abdomen.   We are giving you oxycodone (a weeks supply), and please take only if you feel any pain. Please keep taking your Augmentin (antibiotic) until Friday.   We're also giving you a month supply of the Remeron and the Seroquel, please follow up with your psychiatrist at [facility] and make an appointment to see him.   I've made an appointment for you at the Internal Medicine Clinic in the basement of the hospital for Thursday 8/17 at 3:45, they should be giving you a call to confirm.   It was a pleasure taking care of you! Take care.

## 2022-04-01 ENCOUNTER — Telehealth: Payer: Self-pay

## 2022-04-01 NOTE — Telephone Encounter (Signed)
Transition Care Management Follow-up Telephone Call Date of discharge and from where: 03/31/2022, [facility] How have you been since you were released from the hospital? She said she is feeling a little better but easily gets short of breath.  Any questions or concerns? Yes- noted above  Items Reviewed: Did the pt receive and understand the discharge instructions provided? Yes  Medications obtained and verified? Yes - she has all of her medications except the oxycodone that she still needs to pick up. She didn't have any questions about the med regime. Other? No  Any new allergies since your discharge? No  Dietary orders reviewed? Yes Do you have support at home? Yes   Home Care and Equipment/Supplies: Were home health services ordered? no If so, what is the name of the agency? N/a - they were not able to find a home health agency to accept the referral.  Has the agency set up a time to come to the patient's home? not applicable Were any new equipment or medical supplies ordered?  Yes: wound care supplies What is the name of the medical supply agency? She needs to purchase the supplies. She said she knows what is needed.  Were you able to get the supplies/equipment? She needs to purchase them  Do you have any questions related to the use of the equipment or supplies? No  Functional Questionnaire: (I = Independent and D = Dependent) ADLs: independent.- she said she just has to do things slowly. Her mother can assist if needed.  She said she can do the wound care herself.    Follow up appointments reviewed:  PCP Hospital f/u appt confirmed? Yes  Scheduled to see Dr Laural Benes  - 04/02/2022   Specialist Hospital f/u appt confirmed? Yes  Scheduled to see [facility]- 04/02/2022  Are transportation arrangements needed? Yes - I arranged taxi ride to clinic with [employer] I confirmed patient's address and informed her that pick up time is 1400 and she needs to be ready.  The cab company  may call her to change pick up time if needed.  I also explained to patient that after her appointment at [facility] ,she will go downstairs to [facility]. If they are not able to arrange a ride home, she can return to [facility] and we will arrange a cab ride back home for her.  If their condition worsens, is the pt aware to call PCP or go to the Emergency Dept.? Yes Was the patient provided with contact information for the PCP's office or ED? Yes Was to pt encouraged to call back with questions or concerns? Yes

## 2022-04-02 ENCOUNTER — Ambulatory Visit: Payer: Medicaid Other | Admitting: Internal Medicine

## 2022-04-02 ENCOUNTER — Ambulatory Visit: Payer: Medicaid Other | Attending: Internal Medicine | Admitting: Internal Medicine

## 2022-04-02 ENCOUNTER — Encounter (HOSPITAL_COMMUNITY): Payer: Self-pay | Admitting: Emergency Medicine

## 2022-04-02 ENCOUNTER — Emergency Department (HOSPITAL_COMMUNITY)
Admission: EM | Admit: 2022-04-02 | Discharge: 2022-04-02 | Disposition: A | Discharge status: Home / Self Care | Payer: Medicaid Other | Attending: Student | Admitting: Student

## 2022-04-02 ENCOUNTER — Encounter: Payer: Self-pay | Admitting: Internal Medicine

## 2022-04-02 ENCOUNTER — Other Ambulatory Visit: Payer: Self-pay

## 2022-04-02 VITALS — BP 126/100 | HR 106 | Temp 98.2°F | Ht 63.0 in | Wt 139.4 lb

## 2022-04-02 DIAGNOSIS — F250 Schizoaffective disorder, bipolar type: Secondary | ICD-10-CM | POA: Diagnosis not present

## 2022-04-02 DIAGNOSIS — E876 Hypokalemia: Secondary | ICD-10-CM

## 2022-04-02 DIAGNOSIS — I10 Essential (primary) hypertension: Secondary | ICD-10-CM

## 2022-04-02 DIAGNOSIS — F17200 Nicotine dependence, unspecified, uncomplicated: Secondary | ICD-10-CM

## 2022-04-02 DIAGNOSIS — R1030 Lower abdominal pain, unspecified: Secondary | ICD-10-CM | POA: Insufficient documentation

## 2022-04-02 DIAGNOSIS — Z5321 Procedure and treatment not carried out due to patient leaving prior to being seen by health care provider: Secondary | ICD-10-CM | POA: Insufficient documentation

## 2022-04-02 DIAGNOSIS — R6883 Chills (without fever): Secondary | ICD-10-CM | POA: Insufficient documentation

## 2022-04-02 DIAGNOSIS — R1084 Generalized abdominal pain: Secondary | ICD-10-CM | POA: Diagnosis not present

## 2022-04-02 DIAGNOSIS — N739 Female pelvic inflammatory disease, unspecified: Secondary | ICD-10-CM

## 2022-04-02 DIAGNOSIS — Z09 Encounter for follow-up examination after completed treatment for conditions other than malignant neoplasm: Secondary | ICD-10-CM | POA: Diagnosis not present

## 2022-04-02 DIAGNOSIS — R1110 Vomiting, unspecified: Secondary | ICD-10-CM | POA: Diagnosis not present

## 2022-04-02 LAB — CBC WITH DIFFERENTIAL/PLATELET
Abs Immature Granulocytes: 0.02 10*3/uL (ref 0.00–0.07)
Basophils Absolute: 0.1 10*3/uL (ref 0.0–0.1)
Basophils Relative: 1 %
Eosinophils Absolute: 0.4 10*3/uL (ref 0.0–0.5)
Eosinophils Relative: 4 %
HCT: 34 % — ABNORMAL LOW (ref 36.0–46.0)
Hemoglobin: 11.2 g/dL — ABNORMAL LOW (ref 12.0–15.0)
Immature Granulocytes: 0 %
Lymphocytes Relative: 33 %
Lymphs Abs: 3.1 10*3/uL (ref 0.7–4.0)
MCH: 28.3 pg (ref 26.0–34.0)
MCHC: 32.9 g/dL (ref 30.0–36.0)
MCV: 85.9 fL (ref 80.0–100.0)
Monocytes Absolute: 0.5 10*3/uL (ref 0.1–1.0)
Monocytes Relative: 5 %
Neutro Abs: 5.2 10*3/uL (ref 1.7–7.7)
Neutrophils Relative %: 57 %
Platelets: 411 10*3/uL — ABNORMAL HIGH (ref 150–400)
RBC: 3.96 MIL/uL (ref 3.87–5.11)
RDW: 16.7 % — ABNORMAL HIGH (ref 11.5–15.5)
WBC: 9.2 10*3/uL (ref 4.0–10.5)
nRBC: 0 % (ref 0.0–0.2)

## 2022-04-02 LAB — COMPREHENSIVE METABOLIC PANEL
ALT: 15 U/L (ref 0–44)
AST: 30 U/L (ref 15–41)
Albumin: 3.2 g/dL — ABNORMAL LOW (ref 3.5–5.0)
Alkaline Phosphatase: 75 U/L (ref 38–126)
Anion gap: 10 (ref 5–15)
BUN: 6 mg/dL (ref 6–20)
CO2: 23 mmol/L (ref 22–32)
Calcium: 9.6 mg/dL (ref 8.9–10.3)
Chloride: 104 mmol/L (ref 98–111)
Creatinine, Ser: 0.68 mg/dL (ref 0.44–1.00)
GFR, Estimated: >60 mL/min (ref >60)
Glucose, Bld: 85 mg/dL (ref 70–99)
Potassium: 3 mmol/L — ABNORMAL LOW (ref 3.5–5.1)
Sodium: 137 mmol/L (ref 135–145)
Total Bilirubin: 0.6 mg/dL (ref 0.3–1.2)
Total Protein: 7.7 g/dL (ref 6.5–8.1)

## 2022-04-02 LAB — I-STAT BETA HCG BLOOD, ED (MC, WL, AP ONLY): I-stat hCG, quantitative: <5 m[IU]/mL (ref <5)

## 2022-04-02 LAB — LIPASE, BLOOD: Lipase: 50 U/L (ref 11–51)

## 2022-04-02 MED ORDER — ONDANSETRON HCL 4 MG PO TABS: 4.0000 mg | ORAL_TABLET | Freq: Two times a day (BID) | ORAL | 0 refills | Status: DC | PRN

## 2022-04-02 MED ORDER — TRAMADOL HCL 50 MG PO TABS: 50.0000 mg | ORAL_TABLET | Freq: Two times a day (BID) | ORAL | 0 refills | Status: DC | PRN

## 2022-04-02 MED ORDER — OXYCODONE HCL 5 MG PO TABS
5.0000 mg | ORAL_TABLET | Freq: Once | ORAL | Status: AC
  Administered 2022-04-02: 5 mg via ORAL
  Filled 2022-04-02: qty 1

## 2022-04-02 MED ORDER — POTASSIUM CHLORIDE CRYS ER 20 MEQ PO TBCR: 20.0000 meq | EXTENDED_RELEASE_TABLET | Freq: Every day | ORAL | 0 refills | Status: DC

## 2022-04-02 MED ORDER — ONDANSETRON 4 MG PO TBDP
4.0000 mg | ORAL_TABLET | Freq: Once | ORAL | Status: AC
  Administered 2022-04-02: 4 mg via ORAL
  Filled 2022-04-02: qty 1

## 2022-04-02 NOTE — ED Triage Notes (Signed)
Patient arrived with EMS from home reports low abdominal pain , S/P pelvic abscess incision / drainage of pelvic abscess , admitted and discharged yesterday .

## 2022-04-02 NOTE — ED Notes (Signed)
Patient states she is leaving and her cab is on the way

## 2022-04-02 NOTE — ED Provider Triage Note (Signed)
Emergency Medicine Provider Triage Evaluation Note  Paula Lucas , a 31 y.o. female  was evaluated in triage.  Patient returns to the hospital with complaints of persistent lower abdominal pain, vomiting, and chills.  Was discharged yesterday.  States she was not able to get her pain medication.  Review of Systems  Positive: Chills, abdominal pain, vomiting Negative: Dysuria, vaginal bleeding, vaginal discharge, diarrhea  Physical Exam  BP (!) 137/109   Pulse (!) 110   Temp 99.4 F (37.4 C) (Oral)   Resp 20   SpO2 99%  Gen:   Awake, no distress   Resp:  Normal effort  MSK:   Moves extremities without difficulty  Other:  Open surgical scar with wet-to-dry dressing, no purulence or active bleeding.  Lower abdominal tenderness to palpation.  Medical Decision Making  Medically screening exam initiated at 1:55 AM.  Appropriate orders placed.  Paula Lucas was informed that the remainder of the evaluation will be completed by another provider, this initial triage assessment does not replace that evaluation, and the importance of remaining in the ED until their evaluation is complete.  Multiple recent admissions.  Patient admitted in June for severe sepsis secondary to multiple pelvic abscesses, underwent ex lap with washout and bowel adhesion lysis.  Discharged 7/9, readmitted 7/22-7/25 with fever and abdominal pain, COVID-positive, received IV antibiotics and was discharged on Cefadroxil & flagyl, recently represented 7/27 with worsening abdominal pain and new findings on her CT, remained afebrile, no evidence of new infection, placed back on Augmentin, tried multiple modalities for pain control, ultimately discharged her with a week supply of oxycodone.  Labs ordered. Abdominal pain.    Cherly Anderson, PA-C 04/02/22 0159

## 2022-04-02 NOTE — Progress Notes (Signed)
Patient ID: Paula Lucas, female    DOB: 03-Sep-1990  MRN: [id number]  CC: Transition of care visit Date of hospitalization: 7/27-03/31/2022 Date of call from case worker: 04/01/2022 Subjective: Paula Lucas is a 31 y.o. female who presents for new pt visit, hosp f/u Her concerns today include:  Patient with history of schizoaffective disorder, bipolar type, depression, PID, HTN  Patient with complex history.  Admitted 6/19 - 02/28/2022 for severe sepsis due to multiple pelvic abscesses.  Underwent exploratory lap with washout and bowel adhesion lysis.  Discharge then readmitted 7/22-25/2023 with fever and abdominal pain.  COVID-positive.  Received IV antibiotics and was discharged home on cefadroxil and Flagyl. Readmitted 7/27 with worsening abdominal pain with no new findings on CT.  2 JP drains were removed.  Pain control was a big issue.  She was discharged with a 1 week supply of oxycodone. Seen by psychiatry for schizoaffective disorder.  Restarted on mirtazapine for sleep and appetite and Seroquel.  Told to follow-up with [facility]. Started on spironolactone for hypertension and persistent hypokalemia.  Today: Patient was seen in the emergency room earlier this morning for persistent lower abdominal pain vomiting and chills.  Found to have low-grade fever of 99.4.  Blood pressure was also elevated.  Patient did not stay for evaluation because of the long wait.  Labs showed normal lipase level.  Potassium level of 3.  Normal BUN and creatinine.  Normal WBC.  Mild but stable anemia. She tells me that she has problems holding food down and has to take mainly liquids.  Drinking plenty of water.  Reports normal bowel movements.  Still having abdominal pain.  Wanting oxycodone.  States that she never got the prescription for the oxycodone yesterday before leaving the hospital because they could not catch up with the doctor to sign off on it.  She has visiting nurse who comes out to do dressing  changes on her abdominal wound daily.  No increased drainage from the wound.  Tells me that she has completed Augmentin antibiotics.  Reports compliance with her psychiatric medications which are mirtazapine and Seroquel.  [facility] to be [facility].  Reports compliance with spironolactone.  She is months 3 cigarettes a day.  She tells me she has cut back from a pack a day.  Denies any street drug use or alcohol use. Patient Active Problem List   Diagnosis Date Noted   Schizoaffective disorder, bipolar type (HCC)    Abdominal pain, left upper quadrant    Fever 03/14/2022   COVID-19 03/13/2022   Abscess of abdominal cavity (HCC)    Hypokalemia 03/05/2022   Pleural effusion 03/05/2022   Acute blood loss anemia 03/05/2022   Debility 02/28/2022   Depression 02/23/2022   Protein-calorie malnutrition (HCC)    Hx of Fitz-Hugh-Curtis syndrome    Hepatic abscess 02/15/2022   MRSA (methicillin resistant staph aureus) culture positive    Pelvic abscess       Current Outpatient Medications on File Prior to Visit  Medication Sig Dispense Refill   mirtazapine (REMERON) 7.5 MG tablet Take 1 tablet (7.5 mg total) by mouth at bedtime. 30 tablet 0   oxyCODONE 10 MG TABS Take 1 tablet (10 mg total) by mouth every 4 (four) hours. 30 tablet 0   QUEtiapine (SEROQUEL) 100 MG tablet Take 1 tablet (100 mg total) by mouth at bedtime. 30 tablet 0   spironolactone (ALDACTONE) 25 MG tablet Take 0.5 tablets (12.5 mg total) by mouth 2 (two) times  daily. 30 tablet 0   No current facility-administered medications on file prior to visit.    Allergies  Allergen Reactions   Fish Allergy Anaphylaxis    Severe allergic reaction to all seafoods   Motrin [Ibuprofen] Anaphylaxis    Pt tolerated Ketorolac 04/02/18 Pt tolerated toradol during hosp admission in July 2023   Mushroom Extract Complex Anaphylaxis   Shellfish-Derived Products Anaphylaxis   Tylenol [Acetaminophen] Anaphylaxis    Social History    Socioeconomic History   Marital status: Single    Spouse name: Not on file   Number of children: Not on file   Years of education: Not on file   Highest education level: Not on file  Occupational History   Occupation: UNEMPLOYED  Tobacco Use   Smoking status: Former    Packs/day: 0.50    Years: 9.00    Total pack years: 4.50    Types: Cigarettes   Smokeless tobacco: Never  Substance and Sexual Activity   Alcohol use: No   Drug use: Not Currently    Types: Marijuana    Comment: Leonore   Sexual activity: Yes  Other Topics Concern   Not on file  Social History Narrative   Not on file   Social Determinants of Health   Financial Resource Strain: Medium Risk (04/01/2018)   Overall Financial Resource Strain (CARDIA)    Difficulty of Paying Living Expenses: Somewhat hard  Food Insecurity: Food Insecurity Present (04/01/2018)   Hunger Vital Sign    Worried About Running Out of Food in the Last Year: Sometimes true    Ran Out of Food in the Last Year: Sometimes true  Transportation Needs: Unmet Transportation Needs (04/01/2018)   PRAPARE - Hydrologist (Medical): Yes    Lack of Transportation (Non-Medical): Yes  Physical Activity: Insufficiently Active (04/01/2018)   Exercise Vital Sign    Days of Exercise per Week: 1 day    Minutes of Exercise per Session: 120 min  Stress: No Stress Concern Present (04/01/2018)   [place]    Feeling of Stress : Not at all  Social Connections: Unknown (04/01/2018)   Social Connection and Isolation Panel [NHANES]    Frequency of Communication with Friends and Family: Once a week    Frequency of Social Gatherings with Friends and Family: Once a week    Attends Religious Services: Not on file    Active Member of Clubs or Organizations: No    Attends Archivist Meetings: Never    Marital Status: Never married  Intimate  Partner Violence: Not At Risk (04/01/2018)   Humiliation, Afraid, Rape, and Kick questionnaire    Fear of Current or Ex-Partner: No    Emotionally Abused: No    Physically Abused: No    Sexually Abused: No    Family History  Problem Relation Age of Onset   Diabetes Mother    Hypertension Mother    Diabetes Father    Hypertension Father    Diabetes Sister    Hypertension Sister     Past Surgical History:  Procedure Laterality Date   INDUCED ABORTION     IR RADIOLOGIST EVAL & MGMT  05/17/2018   LAPAROTOMY N/A 02/16/2022   Procedure: EXPLORATORY LAPAROTOMY WITH ABDOMINAL WASHOUT;  Surgeon: Donnamae Jude, MD;  Location: [facility];  Service: Gynecology;  Laterality: N/A;   SMALL BOWEL REPAIR N/A 02/16/2022   Procedure: SMALL BOWEL REPAIR;  Surgeon: Georganna Skeans, MD;  Location: [facility];  Service: General;  Laterality: N/A;   TONSILLECTOMY      ROS: Review of Systems Negative except as stated above  PHYSICAL EXAM: BP (!) 126/100   Pulse (!) 106   Temp 98.2 F (36.8 C) (Oral)   Ht 5\' 3"  (1.6 m)   Wt 139 lb 6.4 oz (63.2 kg)   SpO2 100%   BMI 24.69 kg/m   Physical Exam  General appearance - alert, well appearing, young African-American female and in no distress Mental status -patient answers questions appropriately. Mouth -oral mucosa is moist.  Does not appear dehydrated. Chest - clear to auscultation, no wheezes, rales or rhonchi, symmetric air entry Heart - normal rate, regular rhythm, normal S1, S2, no murmurs, rubs, clicks or gallops Abdomen -nondistended.  Normal active bowel sounds.  Clean wound below the umbilicus measuring about 4 cm with healthy pink tissue.  No surrounding erythema.  She is months 3 cigarettes a day.  She tells me she has cut back from a pack a day.  Denies any street drug use or alcohol use.    Latest Ref Rng & Units 04/02/2022    2:09 AM 03/31/2022    9:17 AM 03/30/2022    1:51 AM  CMP  Glucose 70 - 99 mg/dL 85  72  86   BUN 6 - 20 mg/dL 6  8  7     Creatinine 0.44 - 1.00 mg/dL 0.68  0.75  0.69   Sodium 135 - 145 mmol/L 137  137  137   Potassium 3.5 - 5.1 mmol/L 3.0  4.2  3.2   Chloride 98 - 111 mmol/L 104  104  101   CO2 22 - 32 mmol/L 23  23  23    Calcium 8.9 - 10.3 mg/dL 9.6  9.1  9.3   Total Protein 6.5 - 8.1 g/dL 7.7     Total Bilirubin 0.3 - 1.2 mg/dL 0.6     Alkaline Phos 38 - 126 U/L 75     AST 15 - 41 U/L 30     ALT 0 - 44 U/L 15      Lipid Panel     Component Value Date/Time   CHOL 115 03/27/2022 0101   TRIG 105 03/27/2022 0101   HDL 20 (L) 03/27/2022 0101   CHOLHDL 5.8 03/27/2022 0101   VLDL 21 03/27/2022 0101   LDLCALC 74 03/27/2022 0101    CBC    Component Value Date/Time   WBC 9.2 04/02/2022 0209   RBC 3.96 04/02/2022 0209   HGB 11.2 (L) 04/02/2022 0209   HGB 11.2 04/13/2018 1512   HCT 34.0 (L) 04/02/2022 0209   HCT 35.0 04/13/2018 1512   PLT 411 (H) 04/02/2022 0209   PLT 437 04/13/2018 1512   MCV 85.9 04/02/2022 0209   MCV 92 04/13/2018 1512   MCH 28.3 04/02/2022 0209   MCHC 32.9 04/02/2022 0209   RDW 16.7 (H) 04/02/2022 0209   RDW 15.8 (H) 04/13/2018 1512   LYMPHSABS 3.1 04/02/2022 0209   LYMPHSABS 4.7 (H) 04/13/2018 1512   MONOABS 0.5 04/02/2022 0209   EOSABS 0.4 04/02/2022 0209   EOSABS 0.3 04/13/2018 1512   BASOSABS 0.1 04/02/2022 0209   BASOSABS 0.0 04/13/2018 1512    ASSESSMENT AND PLAN: 1. Hospital discharge follow-up   2. Generalized abdominal pain Advised patient to try soft foods like soup and mashed potatoes.  Try to supplement meals with Ensure shakes.  Given Zofran to use  as needed for nausea/vomiting. I looked in the [place] controlled substance reporting system database.  I do not see where she has filled the prescription for oxycodone yesterday or today.  Reports that she never received the prescription that she was supposed to get on discharge yesterday.  I have given some tramadol for her to use as needed.  Advised that it is a controlled substance and can cause  drowsiness.  I note that her allergies include allergies to Motrin and Tylenol both of which reported to cause anaphylaxis. - ondansetron (ZOFRAN) 4 MG tablet; Take 1 tablet (4 mg total) by mouth 2 (two) times daily as needed for nausea or vomiting.  Dispense: 20 tablet; Refill: 0 - traMADol (ULTRAM) 50 MG tablet; Take 1 tablet (50 mg total) by mouth 2 (two) times daily as needed.  Dispense: 20 tablet; Refill: 0  3. Pelvic abscess  Wound appears to be healing well with no signs of infection.  She will continue daily dressing changes.  We will get her back in with the surgeon.  She missed her appointment with ID this afternoon while seeing me.  Advised that she stops downstairs in their office today before she leaves to reschedule her appointment. - Ambulatory referral to General Surgery  4. Schizoaffective disorder, bipolar type (HCC) - Ambulatory referral to Psychiatry  5. Hypokalemia We will give potassium supplement for 10 days.  When she completes the course, I request that she returns to the lab to have potassium level rechecked. - potassium chloride SA (KLOR-CON M) 20 MEQ tablet; Take 1 tablet (20 mEq total) by mouth daily.  Dispense: 10 tablet; Refill: 0  6. Essential hypertension Not at goal but improved upon repeat today.  She will continue the spironolactone  7. Tobacco dependence Advised to quit.  Discussed health risks associated with smoking.    Patient was given the opportunity to ask questions.  Patient verbalized understanding of the plan and was able to repeat key elements of the plan.   This documentation was completed using Paediatric nurse.  Any transcriptional errors are unintentional.  Orders Placed This Encounter  Procedures   Ambulatory referral to Psychiatry   Ambulatory referral to General Surgery     Requested Prescriptions   Signed Prescriptions Disp Refills   ondansetron (ZOFRAN) 4 MG tablet 20 tablet 0    Sig: Take 1 tablet (4 mg  total) by mouth 2 (two) times daily as needed for nausea or vomiting.   potassium chloride SA (KLOR-CON M) 20 MEQ tablet 10 tablet 0    Sig: Take 1 tablet (20 mEq total) by mouth daily.   traMADol (ULTRAM) 50 MG tablet 20 tablet 0    Sig: Take 1 tablet (50 mg total) by mouth 2 (two) times daily as needed.    Return in about 7 weeks (around 05/21/2022).  Jonah Blue, MD, FACP

## 2022-04-02 NOTE — Patient Instructions (Signed)
Please stop downstairs at infectious disease and reschedule your appointment with them. Your potassium level is low.  I have given potassium supplement to take daily for the next 10 days.  Once you have completed the potassium supplement, please return to the lab to have your potassium level rechecked.  I have prescribed a limited amount of tramadol for you to use as needed for abdominal pain. Try to eat soft foods for the next several days like soup and mashed potatoes.  I have referred you to behavioral health.

## 2022-04-03 ENCOUNTER — Telehealth: Payer: Self-pay | Admitting: Student

## 2022-04-03 DIAGNOSIS — R1084 Generalized abdominal pain: Secondary | ICD-10-CM

## 2022-04-03 MED ORDER — OXYCODONE HCL 10 MG PO TABS: 10.0000 mg | ORAL_TABLET | ORAL | 0 refills | Status: AC

## 2022-04-03 NOTE — Telephone Encounter (Signed)
Received message that Ms. Guilbault had called the hospital back this morning due to her pain medications. The original rx for oxycodone did not go through to her pharmacy. Yesterday she had an appointment with [facility], where she was prescribed tramadol. I called Ms. Aller today, who reports ongoing abdominal pain not controlled with tramadol. I discussed with Ms. Kneisley that we will prescribe five days of oxycodone as part of her discharge. I also cautioned patient that she should not take tramadol and oxycodone together due to adverse side effects. She verbalized understanding. She is also in agreement to follow-up with our clinic this Thursday.   -[facility]up [facility] -Oxycodone 10mg  every 4 hours as needed #30 tablets -Would start tapering down oxycodone in clinic  , MD Internal Medicine PGY-3 Pager: [phone]

## 2022-04-08 ENCOUNTER — Encounter: Payer: Medicaid Other | Admitting: Internal Medicine

## 2022-05-24 ENCOUNTER — Ambulatory Visit: Payer: Medicaid Other | Admitting: Nurse Practitioner

## 2022-06-11 ENCOUNTER — Emergency Department (HOSPITAL_COMMUNITY)
Admission: EM | Admit: 2022-06-11 | Discharge: 2022-06-11 | Discharge status: Against Medical Advice | Payer: Medicaid Other

## 2022-06-11 ENCOUNTER — Other Ambulatory Visit: Payer: Self-pay

## 2022-06-11 ENCOUNTER — Ambulatory Visit (HOSPITAL_COMMUNITY)
Admission: EM | Admit: 2022-06-11 | Discharge: 2022-06-11 | Disposition: A | Discharge status: Home / Self Care | Payer: Medicaid Other

## 2022-06-11 NOTE — ED Notes (Signed)
Called to triage and no answer.

## 2022-06-11 NOTE — ED Notes (Signed)
Called for triage with no answer

## 2023-05-13 ENCOUNTER — Inpatient Hospital Stay (HOSPITAL_BASED_OUTPATIENT_CLINIC_OR_DEPARTMENT_OTHER)
Admission: EM | Admit: 2023-05-13 | Discharge: 2023-05-15 | DRG: 760 | Discharge status: Against Medical Advice | Payer: 59 | Attending: Internal Medicine | Admitting: Internal Medicine

## 2023-05-13 ENCOUNTER — Encounter (HOSPITAL_BASED_OUTPATIENT_CLINIC_OR_DEPARTMENT_OTHER): Payer: Self-pay | Admitting: Emergency Medicine

## 2023-05-13 ENCOUNTER — Other Ambulatory Visit: Payer: Self-pay

## 2023-05-13 DIAGNOSIS — Z79899 Other long term (current) drug therapy: Secondary | ICD-10-CM

## 2023-05-13 DIAGNOSIS — N83201 Unspecified ovarian cyst, right side: Principal | ICD-10-CM | POA: Diagnosis present

## 2023-05-13 DIAGNOSIS — Z794 Long term (current) use of insulin: Secondary | ICD-10-CM

## 2023-05-13 DIAGNOSIS — R1084 Generalized abdominal pain: Secondary | ICD-10-CM | POA: Diagnosis not present

## 2023-05-13 DIAGNOSIS — N739 Female pelvic inflammatory disease, unspecified: Principal | ICD-10-CM

## 2023-05-13 DIAGNOSIS — Z91018 Allergy to other foods: Secondary | ICD-10-CM

## 2023-05-13 DIAGNOSIS — E119 Type 2 diabetes mellitus without complications: Secondary | ICD-10-CM | POA: Diagnosis present

## 2023-05-13 DIAGNOSIS — R109 Unspecified abdominal pain: Secondary | ICD-10-CM | POA: Diagnosis present

## 2023-05-13 DIAGNOSIS — Z743 Need for continuous supervision: Secondary | ICD-10-CM | POA: Diagnosis not present

## 2023-05-13 DIAGNOSIS — L02211 Cutaneous abscess of abdominal wall: Secondary | ICD-10-CM | POA: Diagnosis present

## 2023-05-13 DIAGNOSIS — A5903 Trichomonal cystitis and urethritis: Secondary | ICD-10-CM | POA: Diagnosis present

## 2023-05-13 DIAGNOSIS — Z833 Family history of diabetes mellitus: Secondary | ICD-10-CM

## 2023-05-13 DIAGNOSIS — Z87891 Personal history of nicotine dependence: Secondary | ICD-10-CM

## 2023-05-13 DIAGNOSIS — R102 Pelvic and perineal pain: Secondary | ICD-10-CM | POA: Diagnosis present

## 2023-05-13 DIAGNOSIS — F250 Schizoaffective disorder, bipolar type: Secondary | ICD-10-CM | POA: Diagnosis present

## 2023-05-13 DIAGNOSIS — E860 Dehydration: Secondary | ICD-10-CM | POA: Diagnosis present

## 2023-05-13 DIAGNOSIS — E876 Hypokalemia: Secondary | ICD-10-CM | POA: Diagnosis present

## 2023-05-13 DIAGNOSIS — K439 Ventral hernia without obstruction or gangrene: Secondary | ICD-10-CM | POA: Diagnosis present

## 2023-05-13 DIAGNOSIS — Z5329 Procedure and treatment not carried out because of patient's decision for other reasons: Secondary | ICD-10-CM | POA: Diagnosis not present

## 2023-05-13 DIAGNOSIS — Z91013 Allergy to seafood: Secondary | ICD-10-CM

## 2023-05-13 DIAGNOSIS — J45909 Unspecified asthma, uncomplicated: Secondary | ICD-10-CM | POA: Diagnosis present

## 2023-05-13 DIAGNOSIS — R1030 Lower abdominal pain, unspecified: Secondary | ICD-10-CM | POA: Diagnosis not present

## 2023-05-13 DIAGNOSIS — F32A Depression, unspecified: Secondary | ICD-10-CM | POA: Diagnosis present

## 2023-05-13 DIAGNOSIS — L02219 Cutaneous abscess of trunk, unspecified: Secondary | ICD-10-CM

## 2023-05-13 DIAGNOSIS — Z886 Allergy status to analgesic agent status: Secondary | ICD-10-CM

## 2023-05-13 LAB — COMPREHENSIVE METABOLIC PANEL
ALT: 11 U/L (ref 0–44)
AST: 17 U/L (ref 15–41)
Albumin: 4.2 g/dL (ref 3.5–5.0)
Alkaline Phosphatase: 45 U/L (ref 38–126)
Anion gap: 11 (ref 5–15)
BUN: 9 mg/dL (ref 6–20)
CO2: 22 mmol/L (ref 22–32)
Calcium: 9.2 mg/dL (ref 8.9–10.3)
Chloride: 105 mmol/L (ref 98–111)
Creatinine, Ser: 0.71 mg/dL (ref 0.44–1.00)
GFR, Estimated: >60 mL/min (ref >60)
Glucose, Bld: 106 mg/dL — ABNORMAL HIGH (ref 70–99)
Potassium: 3.2 mmol/L — ABNORMAL LOW (ref 3.5–5.1)
Sodium: 138 mmol/L (ref 135–145)
Total Bilirubin: 0.6 mg/dL (ref 0.3–1.2)
Total Protein: 8.1 g/dL (ref 6.5–8.1)

## 2023-05-13 LAB — CBC
HCT: 37.6 % (ref 36.0–46.0)
Hemoglobin: 12.7 g/dL (ref 12.0–15.0)
MCH: 31.6 pg (ref 26.0–34.0)
MCHC: 33.8 g/dL (ref 30.0–36.0)
MCV: 93.5 fL (ref 80.0–100.0)
Platelets: 373 10*3/uL (ref 150–400)
RBC: 4.02 MIL/uL (ref 3.87–5.11)
RDW: 12.7 % (ref 11.5–15.5)
WBC: 10 10*3/uL (ref 4.0–10.5)
nRBC: 0 % (ref 0.0–0.2)

## 2023-05-13 LAB — LIPASE, BLOOD: Lipase: 29 U/L (ref 11–51)

## 2023-05-13 MED ORDER — SODIUM CHLORIDE 0.9 % IV BOLUS
1000.0000 mL | Freq: Once | INTRAVENOUS | Status: AC
  Administered 2023-05-14: 1000 mL via INTRAVENOUS

## 2023-05-13 MED ORDER — LIDOCAINE-EPINEPHRINE (PF) 2 %-1:200000 IJ SOLN
20.0000 mL | Freq: Once | INTRAMUSCULAR | Status: AC
  Administered 2023-05-14: 20 mL
  Filled 2023-05-13: qty 20

## 2023-05-13 MED ORDER — HYDROMORPHONE HCL 1 MG/ML IJ SOLN
1.0000 mg | Freq: Once | INTRAMUSCULAR | Status: AC
  Administered 2023-05-14: 1 mg via INTRAVENOUS
  Filled 2023-05-13: qty 1

## 2023-05-13 MED ORDER — ONDANSETRON HCL 4 MG/2ML IJ SOLN
4.0000 mg | Freq: Once | INTRAMUSCULAR | Status: AC
  Administered 2023-05-14: 4 mg via INTRAVENOUS
  Filled 2023-05-13: qty 2

## 2023-05-13 NOTE — ED Provider Notes (Signed)
[place] [facility] AT [facility] Provider Note   CSN: [id number] Arrival date & time: 05/13/23  2151     History {Add pertinent medical, surgical, social history, OB history to HPI:1} Chief Complaint  Patient presents with   Abdominal Pain    Paula Lucas is a 32 y.o. female.  Patient reports a history of ovarian cyst as well as pelvic abscesses that were drained last year.  She is presenting today with lower abdominal pain that is been constant for the past 2 months but progressively worsening.  Pain spreads across her entire lower abdomen and low back.  Associated with nausea but no vomiting.  No fever.  Last bowel movement was 3 days ago.  No pain with urination or blood in the urine.  No vaginal bleeding but does have a white vaginal discharge.  Reports she is using a heating pad at home and taking nothing else for this pain.  This pain has been a constant and worsening over the past 2 months she has not seen anybody since she was hospitalized in August 2023.  Also noticed the abscess to her mons pubis that popped up 2 days ago.  Has been some drainage but no bleeding. Still has appendix and gallbladder.  Still has uterus and ovaries.  The history is provided by the patient.  Abdominal Pain Associated symptoms: fatigue and nausea   Associated symptoms: no chest pain, no cough, no dysuria, no fever, no shortness of breath and no vomiting        Home Medications Prior to Admission medications   Medication Sig Start Date End Date Taking? Authorizing Provider  QUEtiapine (SEROQUEL) 100 MG tablet Take 1 tablet (100 mg total) by mouth at bedtime. 03/31/22  Yes Nooruddin, Jason Fila, MD  mirtazapine (REMERON) 7.5 MG tablet Take 1 tablet (7.5 mg total) by mouth at bedtime. 03/31/22   Nooruddin, Jason Fila, MD  ondansetron (ZOFRAN) 4 MG tablet Take 1 tablet (4 mg total) by mouth 2 (two) times daily as needed for nausea or vomiting. 04/02/22   Marcine Matar, MD  potassium  chloride SA (KLOR-CON M) 20 MEQ tablet Take 1 tablet (20 mEq total) by mouth daily. 04/02/22   Marcine Matar, MD  spironolactone (ALDACTONE) 25 MG tablet Take 0.5 tablets (12.5 mg total) by mouth 2 (two) times daily. 03/31/22   Nooruddin, Jason Fila, MD  traMADol (ULTRAM) 50 MG tablet Take 1 tablet (50 mg total) by mouth 2 (two) times daily as needed. 04/02/22   Marcine Matar, MD      Allergies    Fish allergy, Motrin [ibuprofen], Mushroom extract complex, Shellfish-derived products, and Tylenol [acetaminophen]    Review of Systems   Review of Systems  Constitutional:  Positive for activity change, appetite change and fatigue. Negative for fever.  HENT:  Negative for congestion and rhinorrhea.   Respiratory:  Negative for cough and shortness of breath.   Cardiovascular:  Negative for chest pain.  Gastrointestinal:  Positive for abdominal pain and nausea. Negative for vomiting.  Genitourinary:  Positive for pelvic pain. Negative for dysuria.  Musculoskeletal:  Positive for arthralgias, back pain and myalgias.  Skin:  Negative for rash.  Neurological:  Negative for dizziness, weakness and headaches.   all other systems are negative except as noted in the HPI and PMH.    Physical Exam Updated Vital Signs BP (!) 132/91 (BP Location: Left Arm)   Pulse 92   Temp 98.2 F (36.8 C) (Oral)   Resp  18   Ht 5\' 2"  (1.575 m)   Wt 81.6 kg   SpO2 97%   BMI 32.92 kg/m  Physical Exam Vitals and nursing note reviewed.  Constitutional:      General: She is not in acute distress.    Appearance: She is well-developed.  HENT:     Head: Normocephalic and atraumatic.     Mouth/Throat:     Pharynx: No oropharyngeal exudate.  Eyes:     Conjunctiva/sclera: Conjunctivae normal.     Pupils: Pupils are equal, round, and reactive to light.  Neck:     Comments: No meningismus. Cardiovascular:     Rate and Rhythm: Normal rate and regular rhythm.     Heart sounds: Normal heart sounds. No murmur  heard. Pulmonary:     Effort: Pulmonary effort is normal. No respiratory distress.     Breath sounds: Normal breath sounds.  Abdominal:     Palpations: Abdomen is soft.     Tenderness: There is abdominal tenderness. There is guarding. There is no rebound.     Comments: Standard abdomen, diffuse lower abdominal tenderness with voluntary guarding.  No rebound.  Small pustule to pubic area superiorly with some drainage  Musculoskeletal:        General: No tenderness. Normal range of motion.     Cervical back: Normal range of motion and neck supple.  Skin:    General: Skin is warm.  Neurological:     Mental Status: She is alert and oriented to person, place, and time.     Cranial Nerves: No cranial nerve deficit.     Motor: No abnormal muscle tone.     Coordination: Coordination normal.     Comments:  5/5 strength throughout. CN 2-12 intact.Equal grip strength.   Psychiatric:        Behavior: Behavior normal.     ED Results / Procedures / Treatments   Labs (all labs ordered are listed, but only abnormal results are displayed) Labs Reviewed  COMPREHENSIVE METABOLIC PANEL - Abnormal; Notable for the following components:      Result Value   Potassium 3.2 (*)    Glucose, Bld 106 (*)    All other components within normal limits  WET PREP, GENITAL  CULTURE, BLOOD (ROUTINE X 2)  CULTURE, BLOOD (ROUTINE X 2)  LIPASE, BLOOD  CBC  URINALYSIS, ROUTINE W REFLEX MICROSCOPIC  PREGNANCY, URINE  LACTIC ACID, PLASMA  LACTIC ACID, PLASMA  GC/CHLAMYDIA PROBE AMP ([place]) NOT AT [facility]    EKG None  Radiology No results found.  Procedures Procedures  {Document cardiac monitor, telemetry assessment procedure when appropriate:1}  Medications Ordered in ED Medications  lidocaine-EPINEPHrine (XYLOCAINE W/EPI) 2 %-1:200000 (PF) injection 20 mL (has no administration in time range)  sodium chloride 0.9 % bolus 1,000 mL (has no administration in time range)  HYDROmorphone (DILAUDID)  injection 1 mg (has no administration in time range)  ondansetron (ZOFRAN) injection 4 mg (has no administration in time range)    ED Course/ Medical Decision Making/ A&P   {   Click here for ABCD2, HEART and other calculatorsREFRESH Note before signing :1}                              Medical Decision Making Amount and/or Complexity of Data Reviewed Labs: ordered. Decision-making details documented in ED Course. Radiology: ordered and independent interpretation performed. Decision-making details documented in ED Course. ECG/medicine tests: ordered and independent interpretation  performed. Decision-making details documented in ED Course.  Risk Prescription drug management.   Patient with a history of pelvic abscesses here with lower abdominal pain for the past 2 months.  Stable vital signs.  No fever.  No pain with urination or blood in the urine.  Will treat symptoms.  Hydrate, check labs including cultures.  {Document critical care time when appropriate:1} {Document review of labs and clinical decision tools ie heart score, Chads2Vasc2 etc:1}  {Document your independent review of radiology images, and any outside records:1} {Document your discussion with family members, caretakers, and with consultants:1} {Document social determinants of health affecting pt's care:1} {Document your decision making why or why not admission, treatments were needed:1} Final Clinical Impression(s) / ED Diagnoses Final diagnoses:  None    Rx / DC Orders ED Discharge Orders     None

## 2023-05-13 NOTE — ED Triage Notes (Addendum)
Patient bib [facility] complaining of lower abdominal pain and odorous vaginal discharge ongoing for 2 months since her miscarriage. Patient also states she has abscess on lower abdomen that she first noticed 2 days ago. Patient is also requesting to be checked to see if her ovarian cyst has come back that she had removed in July 2023 .   EMS Vitals  BP 120/76, HR 92, Spo2 98%, T 98.5, CBG 109

## 2023-05-13 NOTE — ED Notes (Signed)
Pt is aware that we need urine sample, EDP in room

## 2023-05-14 ENCOUNTER — Encounter (HOSPITAL_BASED_OUTPATIENT_CLINIC_OR_DEPARTMENT_OTHER): Payer: Self-pay

## 2023-05-14 ENCOUNTER — Emergency Department (HOSPITAL_BASED_OUTPATIENT_CLINIC_OR_DEPARTMENT_OTHER): Payer: 59

## 2023-05-14 ENCOUNTER — Inpatient Hospital Stay (HOSPITAL_COMMUNITY): Payer: 59

## 2023-05-14 DIAGNOSIS — N858 Other specified noninflammatory disorders of uterus: Secondary | ICD-10-CM | POA: Diagnosis not present

## 2023-05-14 DIAGNOSIS — Z91018 Allergy to other foods: Secondary | ICD-10-CM | POA: Diagnosis not present

## 2023-05-14 DIAGNOSIS — F250 Schizoaffective disorder, bipolar type: Secondary | ICD-10-CM | POA: Diagnosis not present

## 2023-05-14 DIAGNOSIS — L02219 Cutaneous abscess of trunk, unspecified: Secondary | ICD-10-CM | POA: Diagnosis not present

## 2023-05-14 DIAGNOSIS — L02211 Cutaneous abscess of abdominal wall: Secondary | ICD-10-CM | POA: Diagnosis not present

## 2023-05-14 DIAGNOSIS — E119 Type 2 diabetes mellitus without complications: Secondary | ICD-10-CM | POA: Diagnosis not present

## 2023-05-14 DIAGNOSIS — J45909 Unspecified asthma, uncomplicated: Secondary | ICD-10-CM | POA: Diagnosis not present

## 2023-05-14 DIAGNOSIS — F32A Depression, unspecified: Secondary | ICD-10-CM

## 2023-05-14 DIAGNOSIS — Z79899 Other long term (current) drug therapy: Secondary | ICD-10-CM | POA: Diagnosis not present

## 2023-05-14 DIAGNOSIS — A5903 Trichomonal cystitis and urethritis: Secondary | ICD-10-CM | POA: Diagnosis not present

## 2023-05-14 DIAGNOSIS — R1900 Intra-abdominal and pelvic swelling, mass and lump, unspecified site: Secondary | ICD-10-CM | POA: Diagnosis not present

## 2023-05-14 DIAGNOSIS — K439 Ventral hernia without obstruction or gangrene: Secondary | ICD-10-CM | POA: Diagnosis not present

## 2023-05-14 DIAGNOSIS — E860 Dehydration: Secondary | ICD-10-CM | POA: Diagnosis not present

## 2023-05-14 DIAGNOSIS — Z794 Long term (current) use of insulin: Secondary | ICD-10-CM

## 2023-05-14 DIAGNOSIS — Z5329 Procedure and treatment not carried out because of patient's decision for other reasons: Secondary | ICD-10-CM | POA: Diagnosis not present

## 2023-05-14 DIAGNOSIS — Z886 Allergy status to analgesic agent status: Secondary | ICD-10-CM | POA: Diagnosis not present

## 2023-05-14 DIAGNOSIS — N739 Female pelvic inflammatory disease, unspecified: Secondary | ICD-10-CM | POA: Diagnosis not present

## 2023-05-14 DIAGNOSIS — A599 Trichomoniasis, unspecified: Secondary | ICD-10-CM

## 2023-05-14 DIAGNOSIS — Z87891 Personal history of nicotine dependence: Secondary | ICD-10-CM | POA: Diagnosis not present

## 2023-05-14 DIAGNOSIS — Z91013 Allergy to seafood: Secondary | ICD-10-CM | POA: Diagnosis not present

## 2023-05-14 DIAGNOSIS — D259 Leiomyoma of uterus, unspecified: Secondary | ICD-10-CM | POA: Diagnosis not present

## 2023-05-14 DIAGNOSIS — E876 Hypokalemia: Secondary | ICD-10-CM | POA: Diagnosis not present

## 2023-05-14 DIAGNOSIS — R102 Pelvic and perineal pain: Secondary | ICD-10-CM | POA: Diagnosis not present

## 2023-05-14 DIAGNOSIS — R1030 Lower abdominal pain, unspecified: Secondary | ICD-10-CM | POA: Diagnosis not present

## 2023-05-14 DIAGNOSIS — Z833 Family history of diabetes mellitus: Secondary | ICD-10-CM | POA: Diagnosis not present

## 2023-05-14 DIAGNOSIS — R109 Unspecified abdominal pain: Secondary | ICD-10-CM | POA: Diagnosis not present

## 2023-05-14 DIAGNOSIS — N83201 Unspecified ovarian cyst, right side: Secondary | ICD-10-CM | POA: Diagnosis not present

## 2023-05-14 LAB — COMPREHENSIVE METABOLIC PANEL
ALT: 9 U/L (ref 0–44)
AST: 13 U/L — ABNORMAL LOW (ref 15–41)
Albumin: 3 g/dL — ABNORMAL LOW (ref 3.5–5.0)
Alkaline Phosphatase: 44 U/L (ref 38–126)
Anion gap: 11 (ref 5–15)
BUN: 7 mg/dL (ref 6–20)
CO2: 21 mmol/L — ABNORMAL LOW (ref 22–32)
Calcium: 8.6 mg/dL — ABNORMAL LOW (ref 8.9–10.3)
Chloride: 107 mmol/L (ref 98–111)
Creatinine, Ser: 0.74 mg/dL (ref 0.44–1.00)
GFR, Estimated: >60 mL/min (ref >60)
Glucose, Bld: 89 mg/dL (ref 70–99)
Potassium: 3.8 mmol/L (ref 3.5–5.1)
Sodium: 139 mmol/L (ref 135–145)
Total Bilirubin: 0.2 mg/dL — ABNORMAL LOW (ref 0.3–1.2)
Total Protein: 6 g/dL — ABNORMAL LOW (ref 6.5–8.1)

## 2023-05-14 LAB — LACTIC ACID, PLASMA: Lactic Acid, Venous: 0.6 mmol/L (ref 0.5–1.9)

## 2023-05-14 LAB — URINALYSIS, MICROSCOPIC (REFLEX)

## 2023-05-14 LAB — CBC WITH DIFFERENTIAL/PLATELET
Abs Immature Granulocytes: 0.01 10*3/uL (ref 0.00–0.07)
Basophils Absolute: 0.1 10*3/uL (ref 0.0–0.1)
Basophils Relative: 1 %
Eosinophils Absolute: 0.3 10*3/uL (ref 0.0–0.5)
Eosinophils Relative: 4 %
HCT: 32.1 % — ABNORMAL LOW (ref 36.0–46.0)
Hemoglobin: 10.6 g/dL — ABNORMAL LOW (ref 12.0–15.0)
Immature Granulocytes: 0 %
Lymphocytes Relative: 51 %
Lymphs Abs: 3.5 10*3/uL (ref 0.7–4.0)
MCH: 30.9 pg (ref 26.0–34.0)
MCHC: 33 g/dL (ref 30.0–36.0)
MCV: 93.6 fL (ref 80.0–100.0)
Monocytes Absolute: 0.4 10*3/uL (ref 0.1–1.0)
Monocytes Relative: 6 %
Neutro Abs: 2.6 10*3/uL (ref 1.7–7.7)
Neutrophils Relative %: 38 %
Platelets: 299 10*3/uL (ref 150–400)
RBC: 3.43 MIL/uL — ABNORMAL LOW (ref 3.87–5.11)
RDW: 12.9 % (ref 11.5–15.5)
WBC: 6.8 10*3/uL (ref 4.0–10.5)
nRBC: 0 % (ref 0.0–0.2)

## 2023-05-14 LAB — URINALYSIS, ROUTINE W REFLEX MICROSCOPIC
Bilirubin Urine: NEGATIVE
Glucose, UA: NEGATIVE mg/dL
Ketones, ur: NEGATIVE mg/dL
Leukocytes,Ua: NEGATIVE
Nitrite: NEGATIVE
Protein, ur: NEGATIVE mg/dL
Specific Gravity, Urine: 1.01 (ref 1.005–1.030)
pH: 5.5 (ref 5.0–8.0)

## 2023-05-14 LAB — WET PREP, GENITAL
Clue Cells Wet Prep HPF POC: NONE SEEN
Sperm: NONE SEEN
WBC, Wet Prep HPF POC: >=10 — AB (ref <10)
Yeast Wet Prep HPF POC: NONE SEEN

## 2023-05-14 LAB — GLUCOSE, CAPILLARY
Glucose-Capillary: 104 mg/dL — ABNORMAL HIGH (ref 70–99)
Glucose-Capillary: 90 mg/dL (ref 70–99)
Glucose-Capillary: 86 mg/dL (ref 70–99)

## 2023-05-14 LAB — HEMOGLOBIN A1C
Hgb A1c MFr Bld: 5 % (ref 4.8–5.6)
Mean Plasma Glucose: 96.8 mg/dL

## 2023-05-14 LAB — MAGNESIUM: Magnesium: 2 mg/dL (ref 1.7–2.4)

## 2023-05-14 LAB — PHOSPHORUS: Phosphorus: 3.5 mg/dL (ref 2.5–4.6)

## 2023-05-14 LAB — HIV ANTIBODY (ROUTINE TESTING W REFLEX): HIV Screen 4th Generation wRfx: NONREACTIVE

## 2023-05-14 LAB — HCG, SERUM, QUALITATIVE: Preg, Serum: NEGATIVE

## 2023-05-14 MED ORDER — QUETIAPINE FUMARATE 50 MG PO TABS
100.0000 mg | ORAL_TABLET | Freq: Every day | ORAL | Status: DC
  Administered 2023-05-14: 100 mg via ORAL
  Filled 2023-05-14: qty 2

## 2023-05-14 MED ORDER — NICOTINE 21 MG/24HR TD PT24
21.0000 mg | MEDICATED_PATCH | Freq: Every day | TRANSDERMAL | Status: DC
  Administered 2023-05-14 – 2023-05-15 (×2): 21 mg via TRANSDERMAL
  Filled 2023-05-14 (×2): qty 1

## 2023-05-14 MED ORDER — SODIUM CHLORIDE 0.9 % IV SOLN: 2.0000 g | INTRAVENOUS | Status: DC

## 2023-05-14 MED ORDER — METRONIDAZOLE 500 MG PO TABS
2000.0000 mg | ORAL_TABLET | Freq: Once | ORAL | Status: AC
  Administered 2023-05-14: 2000 mg via ORAL
  Filled 2023-05-14: qty 4

## 2023-05-14 MED ORDER — SODIUM CHLORIDE 0.9% FLUSH
3.0000 mL | Freq: Two times a day (BID) | INTRAVENOUS | Status: DC
  Administered 2023-05-14 – 2023-05-15 (×2): 3 mL via INTRAVENOUS

## 2023-05-14 MED ORDER — OXYCODONE HCL 5 MG PO TABS
5.0000 mg | ORAL_TABLET | ORAL | Status: DC | PRN
  Administered 2023-05-14 – 2023-05-15 (×3): 5 mg via ORAL
  Filled 2023-05-14 (×3): qty 1

## 2023-05-14 MED ORDER — DOXYCYCLINE HYCLATE 100 MG PO TABS
100.0000 mg | ORAL_TABLET | Freq: Once | ORAL | Status: AC
  Administered 2023-05-14: 100 mg via ORAL
  Filled 2023-05-14: qty 1

## 2023-05-14 MED ORDER — POTASSIUM CHLORIDE CRYS ER 10 MEQ PO TBCR
20.0000 meq | EXTENDED_RELEASE_TABLET | Freq: Every day | ORAL | Status: DC
  Administered 2023-05-15: 20 meq via ORAL
  Filled 2023-05-14: qty 2

## 2023-05-14 MED ORDER — ENOXAPARIN SODIUM 40 MG/0.4ML IJ SOSY
40.0000 mg | PREFILLED_SYRINGE | INTRAMUSCULAR | Status: DC
  Administered 2023-05-14 – 2023-05-15 (×2): 40 mg via SUBCUTANEOUS
  Filled 2023-05-14 (×2): qty 0.4

## 2023-05-14 MED ORDER — SPIRONOLACTONE 12.5 MG HALF TABLET
12.5000 mg | ORAL_TABLET | Freq: Two times a day (BID) | ORAL | Status: DC
  Administered 2023-05-14: 12.5 mg via ORAL
  Filled 2023-05-14 (×2): qty 1

## 2023-05-14 MED ORDER — SORBITOL 70 % SOLN: 30.0000 mL | Freq: Every day | Status: DC | PRN

## 2023-05-14 MED ORDER — POTASSIUM CHLORIDE CRYS ER 20 MEQ PO TBCR
20.0000 meq | EXTENDED_RELEASE_TABLET | Freq: Every day | ORAL | Status: DC
  Filled 2023-05-14: qty 1

## 2023-05-14 MED ORDER — SODIUM CHLORIDE 0.9 % IV SOLN: INTRAVENOUS | Status: DC

## 2023-05-14 MED ORDER — ONDANSETRON HCL 4 MG PO TABS
4.0000 mg | ORAL_TABLET | Freq: Four times a day (QID) | ORAL | Status: DC | PRN
  Administered 2023-05-15: 4 mg via ORAL
  Filled 2023-05-14: qty 1

## 2023-05-14 MED ORDER — MIRTAZAPINE 15 MG PO TABS
7.5000 mg | ORAL_TABLET | Freq: Every day | ORAL | Status: DC
  Administered 2023-05-14: 7.5 mg via ORAL
  Filled 2023-05-14: qty 1

## 2023-05-14 MED ORDER — ONDANSETRON HCL 4 MG/2ML IJ SOLN: 4.0000 mg | Freq: Four times a day (QID) | INTRAMUSCULAR | Status: DC | PRN

## 2023-05-14 MED ORDER — SODIUM CHLORIDE 0.9 % IV SOLN: 2.0000 g | Freq: Once | INTRAVENOUS | Status: DC

## 2023-05-14 MED ORDER — DOXYCYCLINE HYCLATE 100 MG PO TABS
100.0000 mg | ORAL_TABLET | Freq: Two times a day (BID) | ORAL | Status: DC
  Administered 2023-05-14 – 2023-05-15 (×2): 100 mg via ORAL
  Filled 2023-05-14 (×3): qty 1

## 2023-05-14 MED ORDER — HYDRALAZINE HCL 20 MG/ML IJ SOLN: 10.0000 mg | Freq: Four times a day (QID) | INTRAMUSCULAR | Status: DC | PRN

## 2023-05-14 MED ORDER — POTASSIUM CHLORIDE CRYS ER 10 MEQ PO TBCR
40.0000 meq | EXTENDED_RELEASE_TABLET | Freq: Once | ORAL | Status: AC
  Administered 2023-05-14: 40 meq via ORAL
  Filled 2023-05-14: qty 4

## 2023-05-14 MED ORDER — INSULIN ASPART 100 UNIT/ML IJ SOLN: 0.0000 [IU] | Freq: Three times a day (TID) | INTRAMUSCULAR | Status: DC

## 2023-05-14 MED ORDER — SENNA 8.6 MG PO TABS
1.0000 | ORAL_TABLET | Freq: Two times a day (BID) | ORAL | Status: DC
  Filled 2023-05-14: qty 1

## 2023-05-14 MED ORDER — POLYETHYLENE GLYCOL 3350 17 G PO PACK: 17.0000 g | PACK | Freq: Every day | ORAL | Status: DC | PRN

## 2023-05-14 MED ORDER — SODIUM CHLORIDE 0.9 % IV SOLN
1.0000 g | Freq: Once | INTRAVENOUS | Status: AC
  Administered 2023-05-14: 1 g via INTRAVENOUS
  Filled 2023-05-14: qty 10

## 2023-05-14 MED ORDER — SENNOSIDES-DOCUSATE SODIUM 8.6-50 MG PO TABS
1.0000 | ORAL_TABLET | Freq: Two times a day (BID) | ORAL | Status: DC
  Administered 2023-05-14 – 2023-05-15 (×3): 1 via ORAL
  Filled 2023-05-14 (×3): qty 1

## 2023-05-14 MED ORDER — ALBUTEROL SULFATE (2.5 MG/3ML) 0.083% IN NEBU: 2.5000 mg | INHALATION_SOLUTION | RESPIRATORY_TRACT | Status: DC | PRN

## 2023-05-14 MED ORDER — METRONIDAZOLE 500 MG/100ML IV SOLN
500.0000 mg | Freq: Two times a day (BID) | INTRAVENOUS | Status: DC
  Administered 2023-05-15 (×2): 500 mg via INTRAVENOUS
  Filled 2023-05-14 (×2): qty 100

## 2023-05-14 MED ORDER — SODIUM CHLORIDE 0.9 % IV SOLN
2.0000 g | INTRAVENOUS | Status: DC
  Administered 2023-05-15: 2 g via INTRAVENOUS
  Filled 2023-05-14: qty 20

## 2023-05-14 MED ORDER — HYDROMORPHONE HCL 1 MG/ML IJ SOLN
1.0000 mg | INTRAMUSCULAR | Status: DC | PRN
  Filled 2023-05-14: qty 1

## 2023-05-14 MED ORDER — HYDROMORPHONE HCL 1 MG/ML IJ SOLN
1.0000 mg | INTRAMUSCULAR | Status: DC | PRN
  Administered 2023-05-14 (×3): 1 mg via INTRAVENOUS
  Administered 2023-05-15 (×4): 2 mg via INTRAVENOUS
  Filled 2023-05-14: qty 2
  Filled 2023-05-14: qty 1
  Filled 2023-05-14 (×4): qty 2

## 2023-05-14 MED ORDER — TRAMADOL HCL 50 MG PO TABS: 100.0000 mg | ORAL_TABLET | Freq: Three times a day (TID) | ORAL | Status: DC | PRN

## 2023-05-14 MED ORDER — IOHEXOL 300 MG/ML  SOLN
100.0000 mL | Freq: Once | INTRAMUSCULAR | Status: AC | PRN
  Administered 2023-05-14: 100 mL via INTRAVENOUS

## 2023-05-14 MED ORDER — HYDROMORPHONE HCL 1 MG/ML IJ SOLN
1.0000 mg | Freq: Once | INTRAMUSCULAR | Status: AC
  Administered 2023-05-14: 1 mg via INTRAVENOUS
  Filled 2023-05-14: qty 1

## 2023-05-14 NOTE — H&P (Signed)
History and Physical    Paula Lucas VPX:[id number] DOB: 02-20-1991 DOA: 05/13/2023  PCP: Marcine Matar, MD  Patient coming from: Home via med [facility] ED  I have personally briefly reviewed patient's old medical records in [facility]  Chief Complaint: Abdominal pain  HPI: Paula Lucas is a 32 y.o. female with medical history significant of multiple intra-abdominal/pelvic abscesses for related to PID, history of ex lap with pelvic and abdominal washout and bowel adhesions status post lysis, 01/22/2022, history of hypokalemia presented to the ED with a 36-month history of progressively worsening lower abdominal and back pain.  Patient denies any fevers, no chills, no vomiting, no chest pain, no shortness of breath, no melena, no hematemesis, no hematochezia, no syncopal episode.  Patient does endorse decreased oral intake, generalized weakness, lightheadedness, headaches, thick vaginal whitish discharge also noted.  ED Course: Patient seen in the ED, comprehensive metabolic profile done with a potassium of 3.2 otherwise within normal limits.  Lactic acid level noted at 0.6.  CBC with a white count of 10.0, hemoglobin of 12.7, platelet count of 373 without other abnormalities.  CT abdomen and pelvis done with interval increase in size of 7.3 x 3.3 cm multiseptated cystic mass within the pelvis, recommend pelvic ultrasound for further evaluation, interval increase in size and conspicuity of 3.5 cm anterior uterine wall mass.  Findings could represent uterine fibroids versus leiomyosarcoma given increase in size recommend pelvic ultrasound for further evaluation.  Small fat-containing ventral hernia within an abdominal defect of 0.3 cm.  Patient underwent a pelvic exam by ED physician with no noted adnexal tenderness.  Pelvic ultrasound ordered  Review of Systems: As per HPI otherwise all other systems reviewed and are negative.  Past Medical History:  Diagnosis Date   Asthma     BMI 31.0-31.9,adult    Chlamydia    Gonorrhea    TOA (tubo-ovarian abscess)     Past Surgical History:  Procedure Laterality Date   INDUCED ABORTION     IR RADIOLOGIST EVAL & MGMT  05/17/2018   LAPAROTOMY N/A 02/16/2022   Procedure: EXPLORATORY LAPAROTOMY WITH ABDOMINAL WASHOUT;  Surgeon: Reva Bores, MD;  Location: [facility] OR;  Service: Gynecology;  Laterality: N/A;   SMALL BOWEL REPAIR N/A 02/16/2022   Procedure: SMALL BOWEL REPAIR;  Surgeon: Violeta Gelinas, MD;  Location: [facility] OR;  Service: General;  Laterality: N/A;   TONSILLECTOMY      Social History  reports that she has quit smoking. Her smoking use included cigarettes. She has a 4.5 pack-year smoking history. She has never used smokeless tobacco. She reports that she does not currently use drugs after having used the following drugs: Marijuana. She reports that she does not drink alcohol.  Allergies  Allergen Reactions   Fish Allergy Anaphylaxis    Severe allergic reaction to all seafoods   Motrin [Ibuprofen] Anaphylaxis    Pt tolerated Ketorolac 04/02/18 Pt tolerated toradol during hosp admission in July 2023   Mushroom Extract Complex Anaphylaxis   Shellfish-Derived Products Anaphylaxis   Tylenol [Acetaminophen] Anaphylaxis    Family History  Problem Relation Age of Onset   Diabetes Mother    Hypertension Mother    Diabetes Father    Hypertension Father    Diabetes Sister    Hypertension Sister    Mother alive age 25 with a history of stroke, CAD, diabetes, heart attack. Father alive age 55 with a history of diabetes.  Prior to Admission medications   Medication  Sig Start Date End Date Taking? Authorizing Provider  QUEtiapine (SEROQUEL) 100 MG tablet Take 1 tablet (100 mg total) by mouth at bedtime. 03/31/22  Yes Nooruddin, Jason Fila, MD  mirtazapine (REMERON) 7.5 MG tablet Take 1 tablet (7.5 mg total) by mouth at bedtime. 03/31/22   Nooruddin, Jason Fila, MD  ondansetron (ZOFRAN) 4 MG tablet Take 1 tablet (4 mg total) by mouth  2 (two) times daily as needed for nausea or vomiting. 04/02/22   Marcine Matar, MD  potassium chloride SA (KLOR-CON M) 20 MEQ tablet Take 1 tablet (20 mEq total) by mouth daily. 04/02/22   Marcine Matar, MD  spironolactone (ALDACTONE) 25 MG tablet Take 0.5 tablets (12.5 mg total) by mouth 2 (two) times daily. 03/31/22   Nooruddin, Jason Fila, MD  traMADol (ULTRAM) 50 MG tablet Take 1 tablet (50 mg total) by mouth 2 (two) times daily as needed. 04/02/22   Marcine Matar, MD    Physical Exam: Vitals:   05/14/23 0654 05/14/23 0655 05/14/23 0756 05/14/23 0925  BP: 93/66  95/66 100/67  Pulse:  71 69 83  Resp:   12 16  Temp:   98.2 F (36.8 C) 98.5 F (36.9 C)  TempSrc:   Oral Oral  SpO2:  100% 96% 100%  Weight:      Height:        Constitutional: NAD, calm, comfortable Vitals:   05/14/23 0654 05/14/23 0655 05/14/23 0756 05/14/23 0925  BP: 93/66  95/66 100/67  Pulse:  71 69 83  Resp:   12 16  Temp:   98.2 F (36.8 C) 98.5 F (36.9 C)  TempSrc:   Oral Oral  SpO2:  100% 96% 100%  Weight:      Height:       Eyes: PERRL, lids and conjunctivae normal ENMT: Mucous membranes are dry. Posterior pharynx clear of any exudate or lesions.Normal dentition.  Neck: normal, supple, no masses, no thyromegaly Respiratory: clear to auscultation bilaterally, no wheezing, no crackles. Normal respiratory effort. No accessory muscle use.  Cardiovascular: Regular rate and rhythm, no murmurs / rubs / gallops. No extremity edema. 2+ pedal pulses. No carotid bruits.  Abdomen: Abdomen is soft, mildly distended, positive bowel sounds, tender to palpation in the left lower quadrant, suprapubic region, right lower quadrant.  Some voluntary guarding.  No rebound.   Musculoskeletal: no clubbing / cyanosis. No joint deformity upper and lower extremities. Good ROM, no contractures. Normal muscle tone.  Skin: Mons pubis with boil/pustule s/p I&D with some whitish discharge noted.  Neurologic: CN 2-12 grossly  intact. Sensation intact, DTR normal. Strength 5/5 in all 4.  Psychiatric: Normal judgment and insight. Alert and oriented x 3. Normal mood.   Labs on Admission: I have personally reviewed following labs and imaging studies  CBC: Recent Labs  Lab 05/13/23 2207  WBC 10.0  HGB 12.7  HCT 37.6  MCV 93.5  PLT 373    Basic Metabolic Panel: Recent Labs  Lab 05/13/23 2207  NA 138  K 3.2*  CL 105  CO2 22  GLUCOSE 106*  BUN 9  CREATININE 0.71  CALCIUM 9.2    GFR: Estimated Creatinine Clearance: 101.8 mL/min (by C-G formula based on SCr of 0.71 mg/dL).  Liver Function Tests: Recent Labs  Lab 05/13/23 2207  AST 17  ALT 11  ALKPHOS 45  BILITOT 0.6  PROT 8.1  ALBUMIN 4.2    Urine analysis:    Component Value Date/Time   COLORURINE YELLOW 05/14/2023 0457  APPEARANCEUR CLOUDY (A) 05/14/2023 0457   LABSPEC 1.010 05/14/2023 0457   PHURINE 5.5 05/14/2023 0457   GLUCOSEU NEGATIVE 05/14/2023 0457   HGBUR TRACE (A) 05/14/2023 0457   BILIRUBINUR NEGATIVE 05/14/2023 0457   BILIRUBINUR negative 04/13/2018 1521   KETONESUR NEGATIVE 05/14/2023 0457   PROTEINUR NEGATIVE 05/14/2023 0457   UROBILINOGEN 0.2 05/23/2019 1646   NITRITE NEGATIVE 05/14/2023 0457   LEUKOCYTESUR NEGATIVE 05/14/2023 0457    Radiological Exams on Admission: US PELVIS (TRANSABDOMINAL ONLY)  Result Date: 05/14/2023 CLINICAL DATA:  Pelvic mass EXAM: TRANSABDOMINAL ULTRASOUND OF PELVIS DOPPLER ULTRASOUND OF OVARIES TECHNIQUE: Transabdominal ultrasound examination of the pelvis was performed including evaluation of the uterus, ovaries, adnexal regions, and pelvic cul-de-sac. Color and duplex Doppler ultrasound was utilized to evaluate blood flow to the ovaries. COMPARISON:  CT from earlier today FINDINGS: Uterus Measurements: 8 x 4 x 5 cm = volume: 105 mL. Intramural masses measuring up to 4 cm at the fundus. Endometrium Thickness: 4 mm.  No focal abnormality visualized. Right ovary Enlarged to 47 x 44 x 64 mm,  ~ 70 cc, with 2 hypoechoic areas measuring up to 4.8 and 2.3 cm, correlating with cystic appearing structures by CT. These both appear intra-ovarian and new from abdominal CT 03/29/2022. There are some internal echoes without color Doppler flow, suspect hemorrhagic cyst. Left ovary Measurements: 36 x 32 x 31 mm = volume: 18 mL. Normal appearance/no adnexal mass. Pulsed Doppler evaluation demonstrates normal low-resistance arterial and venous waveforms in both ovaries. Other: Small volume pelvic fluid. IMPRESSION: 1. Normal ovarian blood flow. 2. Mildly complicated cysts in the right ovary measuring up to 4.5 cm, recommend follow-up in 6-12 weeks. 3. Uterine fibroids. Electronically Signed   By: Tiburcio Pea M.D.   On: 05/14/2023 11:59   US ABDOMINAL PELVIC ART/VENT FLOW DOPPLER  Result Date: 05/14/2023 CLINICAL DATA:  Pelvic mass EXAM: TRANSABDOMINAL ULTRASOUND OF PELVIS DOPPLER ULTRASOUND OF OVARIES TECHNIQUE: Transabdominal ultrasound examination of the pelvis was performed including evaluation of the uterus, ovaries, adnexal regions, and pelvic cul-de-sac. Color and duplex Doppler ultrasound was utilized to evaluate blood flow to the ovaries. COMPARISON:  CT from earlier today FINDINGS: Uterus Measurements: 8 x 4 x 5 cm = volume: 105 mL. Intramural masses measuring up to 4 cm at the fundus. Endometrium Thickness: 4 mm.  No focal abnormality visualized. Right ovary Enlarged to 47 x 44 x 64 mm, ~ 70 cc, with 2 hypoechoic areas measuring up to 4.8 and 2.3 cm, correlating with cystic appearing structures by CT. These both appear intra-ovarian and new from abdominal CT 03/29/2022. There are some internal echoes without color Doppler flow, suspect hemorrhagic cyst. Left ovary Measurements: 36 x 32 x 31 mm = volume: 18 mL. Normal appearance/no adnexal mass. Pulsed Doppler evaluation demonstrates normal low-resistance arterial and venous waveforms in both ovaries. Other: Small volume pelvic fluid. IMPRESSION: 1.  Normal ovarian blood flow. 2. Mildly complicated cysts in the right ovary measuring up to 4.5 cm, recommend follow-up in 6-12 weeks. 3. Uterine fibroids. Electronically Signed   By: Tiburcio Pea M.D.   On: 05/14/2023 11:59   CT ABDOMEN PELVIS W CONTRAST  Result Date: 05/14/2023 CLINICAL DATA:  Abdominal pain, acute, nonlocalized diffuse abdominal pain, hx pelvic abscess omplaining of lower abdominal pain and odorous vaginal discharge ongoing for 2 months since her miscarriage. EXAM: CT ABDOMEN AND PELVIS WITH CONTRAST TECHNIQUE: Multidetector CT imaging of the abdomen and pelvis was performed using the standard protocol following bolus administration of intravenous contrast. RADIATION  DOSE REDUCTION: This exam was performed according to the departmental dose-optimization program which includes automated exposure control, adjustment of the mA and/or kV according to patient size and/or use of iterative reconstruction technique. CONTRAST:  OMNIPAQUE IOHEXOL 300 MG/ML  SOLN COMPARISON:  CT abdomen pelvis 03/29/2022 FINDINGS: Lower chest: No acute abnormality. Hepatobiliary: No focal liver abnormality. No gallstones, gallbladder wall thickening, or pericholecystic fluid. No biliary dilatation. Pancreas: No focal lesion. Normal pancreatic contour. No surrounding inflammatory changes. No main pancreatic ductal dilatation. Spleen: Normal in size without focal abnormality. Adrenals/Urinary Tract: No adrenal nodule bilaterally. Bilateral kidneys enhance symmetrically. No hydronephrosis. No hydroureter. The urinary bladder is unremarkable. Stomach/Bowel: Stomach is within normal limits. No evidence of bowel wall thickening or dilatation. Appendix appears normal. Vascular/Lymphatic: No abdominal aorta or iliac aneurysm. No abdominal, pelvic, or inguinal lymphadenopathy. Reproductive: Interval increase in size and conspicuity of a anterior uterine wall mass measuring up to 3.5 cm. Interval increase in size of 7.3 x  3.3 cm multiseptated cystic mass within the pelvis. Other: No intraperitoneal free fluid. No intraperitoneal free gas. No organized fluid collection. Musculoskeletal: Small fat containing ventral hernia within an abdominal defect of 0.3 cm (2:48). No suspicious lytic or blastic osseous lesions. No acute displaced fracture. IMPRESSION: 1. Interval increase in size of a 7.3 x 3.3 cm multiseptated cystic mass within the pelvis. Recommend pelvic ultrasound for further evaluation. 2. Interval increase in size and conspicuity of a 3.5 cm anterior uterine wall mass. Finding could represent uterine fibroids versus leiomyosarcoma given increase in size. Recommend pelvic ultrasound for further evaluation. 3. Small fat containing ventral hernia within an abdominal defect of 0.3 cm. Electronically Signed   By: Tish Frederickson M.D.   On: 05/14/2023 03:43    EKG: None  Assessment/Plan Principal Problem:   Pain in pelvis Active Problems:   Abdominal pain   Depression   Hypokalemia   Schizoaffective disorder, bipolar type (HCC)   Type 2 diabetes mellitus without complication, with long-term current use of insulin (HCC)   Suprapubic abscess    #1 abdominal/pelvic pain -Patient presenting with a 34-month history of worsening abdominal/pelvic pain. -Patient with prior history of pelvic abscesses status post ex lap with washout with drain placement before. -CT abdomen and pelvis done with interval increase in size of 7.3 x 3.3 cm multiseptated cystic mass within the pelvis, interval increase in size and conspicuity of 3.5 cm anterior uterine wall mass findings could represent uterine fibroids versus leiomyosarcoma given increasing size.  Recommend pelvic ultrasound.  Small fat-containing ventral hernia within an abdominal defect of 0.3 cm. -Pelvic ultrasound ordered and pending. -Continue current pain management. -Place on Rocephin 2 g IV daily and Flagyl 500 mg IV every 12 hours. -ED physician discussed with  OB/GYN on-call overnight who recommended if any evaluation is needed due to prior history and prior pelvic abscesses will need GYN oncology involvement. -Consult with GYN and GYN oncology for further evaluation and management.  2.  Trichomonas cystitis -Status post 2 g IV metronidazole in ED.  3.  Dehydration -IV fluids.  4.  Hypokalemia -Repeat labs this morning. -K-Dur 40 mEq p.o. x 1.  5.  Schizoaffective disorder, bipolar type -Stable. -Resume home regimen Seroquel and Remeron,  6.  Suprapubic abscess - s/p I&D in the ED. -Place on doxycycline twice daily to complete a 7 to 10-day course of treatment.  7.  Diabetes mellitus type 2 -Check a hemoglobin A1c. -Patient states takes 70/3030 units twice daily. -Placed on sliding scale insulin.  DVT prophylaxis: Lovenox Code Status:   Full Family Communication:  Updated patient. Disposition Plan:   Patient is from:  Home  Anticipated DC to:  Home  Anticipated DC date:  TBD  Anticipated DC barriers: Clinical improvement, pain management.  Consults called:  GYN: Dr. Arnold/GYN oncology: Dr. Tamela Oddi Admission status:  Admit to inpatient/telemetry  Severity of Illness: The appropriate patient status for this patient is INPATIENT. Inpatient status is judged to be reasonable and necessary in order to provide the required intensity of service to ensure the patient's safety. The patient's presenting symptoms, physical exam findings, and initial radiographic and laboratory data in the context of their chronic comorbidities is felt to place them at high risk for further clinical deterioration. Furthermore, it is not anticipated that the patient will be medically stable for discharge from the hospital within 2 midnights of admission.   * I certify that at the point of admission it is my clinical judgment that the patient will require inpatient hospital care spanning beyond 2 midnights from the point of admission due to high intensity  of service, high risk for further deterioration and high frequency of surveillance required.*    Ramiro Harvest MD [facility]  How to contact the [facility] Attending or Consulting provider 7A - 7P or covering provider during after hours 7P -7A, for this patient?   Check the care team in [facility] and look for a) attending/consulting [facility] provider listed and b) the [facility] team listed Log into [email or web address] and use [facility]'s universal password to access. If you do not have the password, please contact the hospital operator. Locate the [facility] provider you are looking for under [facility] and page to a number that you can be directly reached. If you still have difficulty reaching the provider, please page the [facility] (Director on Call) for the Hospitalists listed on amion for assistance.  05/14/2023, 12:20 PM

## 2023-05-14 NOTE — ED Notes (Signed)
Patient transported to CT by CT tech in stretcher.

## 2023-05-14 NOTE — ED Notes (Signed)
Patient brought back to room from CT.

## 2023-05-14 NOTE — ED Notes (Signed)
Attempted report to 6N at [facility] with no answer. Will attempt report again.

## 2023-05-14 NOTE — ED Notes (Signed)
Attempted transport to imaging, but [email or web address] .

## 2023-05-14 NOTE — Consult Note (Signed)
Reason for Consult: Abdominal pain Referring Physician: Myron Schue is an 32 y.o. female.  HPI: She presents w/2 mth h/o low abdominal pain.  The pain is constant, sharp, progressive.  She feels "nodules in her groin" and "like things are falling out" her vagina.  Associated sxs include a nausea, malodorous discharge.  No changes in bowel habits, abnormal vaginal bleeding, fever.  Last intercourse 2 mos ago.  History remarkable for a PID/TOA  requiring exp lap w/washout complicated by an enterotomy in 8/23.  Past Medical History:  Diagnosis Date   Asthma    BMI 31.0-31.9,adult    Chlamydia    Gonorrhea    TOA (tubo-ovarian abscess)     Past Surgical History:  Procedure Laterality Date   INDUCED ABORTION     IR RADIOLOGIST EVAL & MGMT  05/17/2018   LAPAROTOMY N/A 02/16/2022   Procedure: EXPLORATORY LAPAROTOMY WITH ABDOMINAL WASHOUT;  Surgeon: Reva Bores, MD;  Location: [facility] OR;  Service: Gynecology;  Laterality: N/A;   SMALL BOWEL REPAIR N/A 02/16/2022   Procedure: SMALL BOWEL REPAIR;  Surgeon: Violeta Gelinas, MD;  Location: [facility] OR;  Service: General;  Laterality: N/A;   TONSILLECTOMY      Family History  Problem Relation Age of Onset   Diabetes Mother    Hypertension Mother    Diabetes Father    Hypertension Father    Diabetes Sister    Hypertension Sister     Social History:  reports that she has quit smoking. Her smoking use included cigarettes. She has a 4.5 pack-year smoking history. She has never used smokeless tobacco. She reports that she does not currently use drugs after having used the following drugs: Marijuana. She reports that she does not drink alcohol.  Allergies:  Allergies  Allergen Reactions   Fish Allergy Anaphylaxis    Severe allergic reaction to all seafoods   Motrin [Ibuprofen] Anaphylaxis    Pt tolerated Ketorolac 04/02/18 Pt tolerated toradol during hosp admission in July 2023   Mushroom Extract Complex Anaphylaxis    Shellfish-Derived Products Anaphylaxis   Tylenol [Acetaminophen] Anaphylaxis    Medications: I have reviewed the patient's current medications.  Results for orders placed or performed during the hospital encounter of 05/13/23 (from the past 48 hour(s))  Lipase, blood     Status: None   Collection Time: 05/13/23 10:07 PM  Result Value Ref Range   Lipase 29 11 - 51 U/L    Comment: Performed at [facility], [address] [place], [place] [address]  Comprehensive metabolic panel     Status: Abnormal   Collection Time: 05/13/23 10:07 PM  Result Value Ref Range   Sodium 138 135 - 145 mmol/L   Potassium 3.2 (L) 3.5 - 5.1 mmol/L   Chloride 105 98 - 111 mmol/L   CO2 22 22 - 32 mmol/L   Glucose, Bld 106 (H) 70 - 99 mg/dL    Comment: Glucose reference range applies only to samples taken after fasting for at least 8 hours.   BUN 9 6 - 20 mg/dL   Creatinine, Ser 1.91 0.44 - 1.00 mg/dL   Calcium 9.2 8.9 - 47.8 mg/dL   Total Protein 8.1 6.5 - 8.1 g/dL   Albumin 4.2 3.5 - 5.0 g/dL   AST 17 15 - 41 U/L   ALT 11 0 - 44 U/L   Alkaline Phosphatase 45 38 - 126 U/L   Total Bilirubin 0.6 0.3 - 1.2 mg/dL  GFR, Estimated >60 >60 mL/min    Comment: (NOTE) Calculated using the CKD-EPI Creatinine Equation (2021)    Anion gap 11 5 - 15    Comment: Performed at [facility], [address] [place], [place] [address]  CBC     Status: None   Collection Time: 05/13/23 10:07 PM  Result Value Ref Range   WBC 10.0 4.0 - 10.5 K/uL   RBC 4.02 3.87 - 5.11 MIL/uL   Hemoglobin 12.7 12.0 - 15.0 g/dL   HCT 40.9 81.1 - 91.4 %   MCV 93.5 80.0 - 100.0 fL   MCH 31.6 26.0 - 34.0 pg   MCHC 33.8 30.0 - 36.0 g/dL   RDW 78.2 95.6 - 21.3 %   Platelets 373 150 - 400 K/uL   nRBC 0.0 0.0 - 0.2 %    Comment: Performed at [facility], [address] [place], [place] [address]  Wet prep, genital     Status: Abnormal   Collection Time: 05/14/23 12:07 AM  Result Value Ref Range    Yeast Wet Prep HPF POC NONE SEEN NONE SEEN   Trich, Wet Prep PRESENT (A) NONE SEEN   Clue Cells Wet Prep HPF POC NONE SEEN NONE SEEN   WBC, Wet Prep HPF POC >=10 (A) <10   Sperm NONE SEEN     Comment: Performed at [facility], [address] [place], [place] [address]  Lactic acid, plasma     Status: None   Collection Time: 05/14/23 12:43 AM  Result Value Ref Range   Lactic Acid, Venous 0.6 0.5 - 1.9 mmol/L    Comment: Performed at [facility], [address] [place], [place] [address]  hCG, serum, qualitative     Status: None   Collection Time: 05/14/23  1:58 AM  Result Value Ref Range   Preg, Serum NEGATIVE NEGATIVE    Comment:        THE SENSITIVITY OF THIS METHODOLOGY IS >10 mIU/mL. Performed at [facility], [address] [place], [place] [address]   Urinalysis, Routine w reflex microscopic -Urine, Clean Catch     Status: Abnormal   Collection Time: 05/14/23  4:57 AM  Result Value Ref Range   Color, Urine YELLOW YELLOW   APPearance CLOUDY (A) CLEAR   Specific Gravity, Urine 1.010 1.005 - 1.030   pH 5.5 5.0 - 8.0   Glucose, UA NEGATIVE NEGATIVE mg/dL   Hgb urine dipstick TRACE (A) NEGATIVE   Bilirubin Urine NEGATIVE NEGATIVE   Ketones, ur NEGATIVE NEGATIVE mg/dL   Protein, ur NEGATIVE NEGATIVE mg/dL   Nitrite NEGATIVE NEGATIVE   Leukocytes,Ua NEGATIVE NEGATIVE    Comment: Performed at [facility], [address] [place], [place] [address]  Urinalysis, Microscopic (reflex)     Status: Abnormal   Collection Time: 05/14/23  4:57 AM  Result Value Ref Range   RBC / HPF 0-5 0 - 5 RBC/hpf   WBC, UA 21-50 0 - 5 WBC/hpf   Bacteria, UA FEW (A) NONE SEEN   Squamous Epithelial / HPF 11-20 0 - 5 /HPF   Trichomonas, UA PRESENT (A) NONE SEEN    Comment: Performed at [facility], [address] [place], [place] [address]  Glucose, capillary     Status: Abnormal   Collection Time: 05/14/23 12:20 PM  Result Value Ref  Range   Glucose-Capillary 104 (H) 70 - 99  mg/dL    Comment: Glucose reference range applies only to samples taken after fasting for at least 8 hours.   Comment 1 Notify RN     US PELVIS (TRANSABDOMINAL ONLY)  Result Date: 05/14/2023 CLINICAL DATA:  Pelvic mass EXAM: TRANSABDOMINAL ULTRASOUND OF PELVIS DOPPLER ULTRASOUND OF OVARIES TECHNIQUE: Transabdominal ultrasound examination of the pelvis was performed including evaluation of the uterus, ovaries, adnexal regions, and pelvic cul-de-sac. Color and duplex Doppler ultrasound was utilized to evaluate blood flow to the ovaries. COMPARISON:  CT from earlier today FINDINGS: Uterus Measurements: 8 x 4 x 5 cm = volume: 105 mL. Intramural masses measuring up to 4 cm at the fundus. Endometrium Thickness: 4 mm.  No focal abnormality visualized. Right ovary Enlarged to 47 x 44 x 64 mm, ~ 70 cc, with 2 hypoechoic areas measuring up to 4.8 and 2.3 cm, correlating with cystic appearing structures by CT. These both appear intra-ovarian and new from abdominal CT 03/29/2022. There are some internal echoes without color Doppler flow, suspect hemorrhagic cyst. Left ovary Measurements: 36 x 32 x 31 mm = volume: 18 mL. Normal appearance/no adnexal mass. Pulsed Doppler evaluation demonstrates normal low-resistance arterial and venous waveforms in both ovaries. Other: Small volume pelvic fluid. IMPRESSION: 1. Normal ovarian blood flow. 2. Mildly complicated cysts in the right ovary measuring up to 4.5 cm, recommend follow-up in 6-12 weeks. 3. Uterine fibroids. Electronically Signed   By: Tiburcio Pea M.D.   On: 05/14/2023 11:59   US ABDOMINAL PELVIC ART/VENT FLOW DOPPLER  Result Date: 05/14/2023 CLINICAL DATA:  Pelvic mass EXAM: TRANSABDOMINAL ULTRASOUND OF PELVIS DOPPLER ULTRASOUND OF OVARIES TECHNIQUE: Transabdominal ultrasound examination of the pelvis was performed including evaluation of the uterus, ovaries, adnexal regions, and pelvic cul-de-sac. Color and duplex  Doppler ultrasound was utilized to evaluate blood flow to the ovaries. COMPARISON:  CT from earlier today FINDINGS: Uterus Measurements: 8 x 4 x 5 cm = volume: 105 mL. Intramural masses measuring up to 4 cm at the fundus. Endometrium Thickness: 4 mm.  No focal abnormality visualized. Right ovary Enlarged to 47 x 44 x 64 mm, ~ 70 cc, with 2 hypoechoic areas measuring up to 4.8 and 2.3 cm, correlating with cystic appearing structures by CT. These both appear intra-ovarian and new from abdominal CT 03/29/2022. There are some internal echoes without color Doppler flow, suspect hemorrhagic cyst. Left ovary Measurements: 36 x 32 x 31 mm = volume: 18 mL. Normal appearance/no adnexal mass. Pulsed Doppler evaluation demonstrates normal low-resistance arterial and venous waveforms in both ovaries. Other: Small volume pelvic fluid. IMPRESSION: 1. Normal ovarian blood flow. 2. Mildly complicated cysts in the right ovary measuring up to 4.5 cm, recommend follow-up in 6-12 weeks. 3. Uterine fibroids. Electronically Signed   By: Tiburcio Pea M.D.   On: 05/14/2023 11:59   CT ABDOMEN PELVIS W CONTRAST  Result Date: 05/14/2023 CLINICAL DATA:  Abdominal pain, acute, nonlocalized diffuse abdominal pain, hx pelvic abscess omplaining of lower abdominal pain and odorous vaginal discharge ongoing for 2 months since her miscarriage. EXAM: CT ABDOMEN AND PELVIS WITH CONTRAST TECHNIQUE: Multidetector CT imaging of the abdomen and pelvis was performed using the standard protocol following bolus administration of intravenous contrast. RADIATION DOSE REDUCTION: This exam was performed according to the departmental dose-optimization program which includes automated exposure control, adjustment of the mA and/or kV according to patient size and/or use of iterative reconstruction technique. CONTRAST:  OMNIPAQUE IOHEXOL 300 MG/ML  SOLN COMPARISON:  CT abdomen pelvis 03/29/2022 FINDINGS: Lower  chest: No acute abnormality. Hepatobiliary:  No focal liver abnormality. No gallstones, gallbladder wall thickening, or pericholecystic fluid. No biliary dilatation. Pancreas: No focal lesion. Normal pancreatic contour. No surrounding inflammatory changes. No main pancreatic ductal dilatation. Spleen: Normal in size without focal abnormality. Adrenals/Urinary Tract: No adrenal nodule bilaterally. Bilateral kidneys enhance symmetrically. No hydronephrosis. No hydroureter. The urinary bladder is unremarkable. Stomach/Bowel: Stomach is within normal limits. No evidence of bowel wall thickening or dilatation. Appendix appears normal. Vascular/Lymphatic: No abdominal aorta or iliac aneurysm. No abdominal, pelvic, or inguinal lymphadenopathy. Reproductive: Interval increase in size and conspicuity of a anterior uterine wall mass measuring up to 3.5 cm. Interval increase in size of 7.3 x 3.3 cm multiseptated cystic mass within the pelvis. Other: No intraperitoneal free fluid. No intraperitoneal free gas. No organized fluid collection. Musculoskeletal: Small fat containing ventral hernia within an abdominal defect of 0.3 cm (2:48). No suspicious lytic or blastic osseous lesions. No acute displaced fracture. IMPRESSION: 1. Interval increase in size of a 7.3 x 3.3 cm multiseptated cystic mass within the pelvis. Recommend pelvic ultrasound for further evaluation. 2. Interval increase in size and conspicuity of a 3.5 cm anterior uterine wall mass. Finding could represent uterine fibroids versus leiomyosarcoma given increase in size. Recommend pelvic ultrasound for further evaluation. 3. Small fat containing ventral hernia within an abdominal defect of 0.3 cm. Electronically Signed   By: Tish Frederickson M.D.   On: 05/14/2023 03:43    Review of Systems  Constitutional:  Negative for appetite change, fatigue, fever and unexpected weight change.  Gastrointestinal:  Positive for abdominal pain and nausea. Negative for constipation and diarrhea.  Genitourinary:  Positive  for pelvic pain and vaginal discharge. Negative for dysuria and vaginal bleeding.  Neurological:  Negative for dizziness.   Blood pressure 120/68, pulse 87, temperature 98.4 F (36.9 C), temperature source Oral, resp. rate 16, height 5\' 2"  (1.575 m), weight 81.6 kg, SpO2 100%, unknown if currently breastfeeding. Physical Exam Abdominal:     General: Bowel sounds are normal.     Palpations: Abdomen is soft. There is no mass.     Tenderness: There is abdominal tenderness in the right lower quadrant and left upper quadrant. There is no guarding or rebound.  Neurological:     Mental Status: She is alert.     Assessment/Plan: Subacute abdominal/pelvic pain.  Acute trichomonas infection w/bladder involvement. Cystic appearing adnexal lesions, fibroids---unlikely pain generators.  No obvious abscess or indication for surgical intervention Consider acute PID, sequelae of prior infections--CPPS/MPPS  >treat empirically for infection >will re-consult as needed  Antionette Char, MD 05/14/2023, 4:44 PM

## 2023-05-14 NOTE — ED Notes (Signed)
Attempted report to 6N at [facility]. Nurse is unavailable at this time. Call back number provided for 6N nurse to call back for report.

## 2023-05-14 NOTE — Consult Note (Signed)
Reason for Consult:lower abdominal pain and pelvic abscess Referring Physician: Rancour MD  Paula Lucas is an 32 y.o. female.   Pertinent Gynecological History: Menses: No LMP recorded. (Menstrual status: Other). Bleeding: none Sexually transmitted diseases: PID, TOA, trichomonas, chlamydia Previous GYN Procedures: EX pap, drainage of pelvic abscesses   OB History: G2P0010    Menstrual History:  No LMP recorded. (Menstrual status: Other).    Past Medical History:  Diagnosis Date   Asthma    BMI 31.0-31.9,adult    Chlamydia    Gonorrhea    TOA (tubo-ovarian abscess)     Past Surgical History:  Procedure Laterality Date   INDUCED ABORTION     IR RADIOLOGIST EVAL & MGMT  05/17/2018   LAPAROTOMY N/A 02/16/2022   Procedure: EXPLORATORY LAPAROTOMY WITH ABDOMINAL WASHOUT;  Surgeon: Reva Bores, MD;  Location: [facility] OR;  Service: Gynecology;  Laterality: N/A;   SMALL BOWEL REPAIR N/A 02/16/2022   Procedure: SMALL BOWEL REPAIR;  Surgeon: Violeta Gelinas, MD;  Location: [facility] OR;  Service: General;  Laterality: N/A;   TONSILLECTOMY      Family History  Problem Relation Age of Onset   Diabetes Mother    Hypertension Mother    Diabetes Father    Hypertension Father    Diabetes Sister    Hypertension Sister     Social History:  reports that she has quit smoking. Her smoking use included cigarettes. She has a 4.5 pack-year smoking history. She has never used smokeless tobacco. She reports that she does not currently use drugs after having used the following drugs: Marijuana. She reports that she does not drink alcohol.  Allergies:  Allergies  Allergen Reactions   Fish Allergy Anaphylaxis    Severe allergic reaction to all seafoods   Motrin [Ibuprofen] Anaphylaxis    Pt tolerated Ketorolac 04/02/18 Pt tolerated toradol during hosp admission in July 2023   Mushroom Extract Complex Anaphylaxis   Shellfish-Derived Products Anaphylaxis   Tylenol [Acetaminophen] Anaphylaxis     Medications: I have reviewed the patient's current medications.  Review of Systems  Constitutional:  Negative for fever.  Respiratory: Negative.    Cardiovascular: Negative.   Gastrointestinal:  Positive for abdominal pain.  Genitourinary:  Positive for pelvic pain. Negative for menstrual problem, vaginal bleeding and vaginal discharge.  Skin:        Small skin abscess s/p drainage on mons pubis    Blood pressure 100/67, pulse 83, temperature 98.5 F (36.9 C), temperature source Oral, resp. rate 16, height 5\' 2"  (1.575 m), weight 81.6 kg, SpO2 100%, unknown if currently breastfeeding. Physical Exam Vitals and nursing note reviewed.  Constitutional:      Appearance: She is well-developed. She is not ill-appearing.  Cardiovascular:     Rate and Rhythm: Normal rate.  Pulmonary:     Effort: Pulmonary effort is normal.  Abdominal:     General: Abdomen is flat. A surgical scar is present.     Palpations: Abdomen is soft.     Tenderness: There is abdominal tenderness (mild no rebound) in the right lower quadrant and left lower quadrant.  Skin:    General: Skin is warm and dry.  Neurological:     General: No focal deficit present.     Mental Status: She is alert.     Results for orders placed or performed during the hospital encounter of 05/13/23 (from the past 48 hour(s))  Lipase, blood     Status: None   Collection Time: 05/13/23 10:07 PM  Result Value Ref Range   Lipase 29 11 - 51 U/L    Comment: Performed at [facility], [address] [place], [place] [address]  Comprehensive metabolic panel     Status: Abnormal   Collection Time: 05/13/23 10:07 PM  Result Value Ref Range   Sodium 138 135 - 145 mmol/L   Potassium 3.2 (L) 3.5 - 5.1 mmol/L   Chloride 105 98 - 111 mmol/L   CO2 22 22 - 32 mmol/L   Glucose, Bld 106 (H) 70 - 99 mg/dL    Comment: Glucose reference range applies only to samples taken after fasting for at least 8 hours.   BUN 9 6 - 20 mg/dL    Creatinine, Ser 9.14 0.44 - 1.00 mg/dL   Calcium 9.2 8.9 - 78.2 mg/dL   Total Protein 8.1 6.5 - 8.1 g/dL   Albumin 4.2 3.5 - 5.0 g/dL   AST 17 15 - 41 U/L   ALT 11 0 - 44 U/L   Alkaline Phosphatase 45 38 - 126 U/L   Total Bilirubin 0.6 0.3 - 1.2 mg/dL   GFR, Estimated >95 >62 mL/min    Comment: (NOTE) Calculated using the CKD-EPI Creatinine Equation (2021)    Anion gap 11 5 - 15    Comment: Performed at [facility], [address] [place], [place] [address]  CBC     Status: None   Collection Time: 05/13/23 10:07 PM  Result Value Ref Range   WBC 10.0 4.0 - 10.5 K/uL   RBC 4.02 3.87 - 5.11 MIL/uL   Hemoglobin 12.7 12.0 - 15.0 g/dL   HCT 57.8 46.9 - 62.9 %   MCV 93.5 80.0 - 100.0 fL   MCH 31.6 26.0 - 34.0 pg   MCHC 33.8 30.0 - 36.0 g/dL   RDW 52.8 41.3 - 24.4 %   Platelets 373 150 - 400 K/uL   nRBC 0.0 0.0 - 0.2 %    Comment: Performed at [facility], [address] [place], [place] [address]  Wet prep, genital     Status: Abnormal   Collection Time: 05/14/23 12:07 AM  Result Value Ref Range   Yeast Wet Prep HPF POC NONE SEEN NONE SEEN   Trich, Wet Prep PRESENT (A) NONE SEEN   Clue Cells Wet Prep HPF POC NONE SEEN NONE SEEN   WBC, Wet Prep HPF POC >=10 (A) <10   Sperm NONE SEEN     Comment: Performed at [facility], [address] [place], [place] [address]  Lactic acid, plasma     Status: None   Collection Time: 05/14/23 12:43 AM  Result Value Ref Range   Lactic Acid, Venous 0.6 0.5 - 1.9 mmol/L    Comment: Performed at [facility], [address] [place], [place] [address]  hCG, serum, qualitative     Status: None   Collection Time: 05/14/23  1:58 AM  Result Value Ref Range   Preg, Serum NEGATIVE NEGATIVE    Comment:        THE SENSITIVITY OF THIS METHODOLOGY IS >10 mIU/mL. Performed at [facility], [address] [place], [place] [address]   Urinalysis, Routine w reflex microscopic -Urine, Clean Catch      Status: Abnormal   Collection Time: 05/14/23  4:57 AM  Result Value Ref Range   Color, Urine YELLOW YELLOW   APPearance CLOUDY (A) CLEAR   Specific  Gravity, Urine 1.010 1.005 - 1.030   pH 5.5 5.0 - 8.0   Glucose, UA NEGATIVE NEGATIVE mg/dL   Hgb urine dipstick TRACE (A) NEGATIVE   Bilirubin Urine NEGATIVE NEGATIVE   Ketones, ur NEGATIVE NEGATIVE mg/dL   Protein, ur NEGATIVE NEGATIVE mg/dL   Nitrite NEGATIVE NEGATIVE   Leukocytes,Ua NEGATIVE NEGATIVE    Comment: Performed at [facility], [address] [place], [place] [address]  Urinalysis, Microscopic (reflex)     Status: Abnormal   Collection Time: 05/14/23  4:57 AM  Result Value Ref Range   RBC / HPF 0-5 0 - 5 RBC/hpf   WBC, UA 21-50 0 - 5 WBC/hpf   Bacteria, UA FEW (A) NONE SEEN   Squamous Epithelial / HPF 11-20 0 - 5 /HPF   Trichomonas, UA PRESENT (A) NONE SEEN    Comment: Performed at [facility], [address] [place], [place] [address]    US PELVIS (TRANSABDOMINAL ONLY)  Result Date: 05/14/2023 CLINICAL DATA:  Pelvic mass EXAM: TRANSABDOMINAL ULTRASOUND OF PELVIS DOPPLER ULTRASOUND OF OVARIES TECHNIQUE: Transabdominal ultrasound examination of the pelvis was performed including evaluation of the uterus, ovaries, adnexal regions, and pelvic cul-de-sac. Color and duplex Doppler ultrasound was utilized to evaluate blood flow to the ovaries. COMPARISON:  CT from earlier today FINDINGS: Uterus Measurements: 8 x 4 x 5 cm = volume: 105 mL. Intramural masses measuring up to 4 cm at the fundus. Endometrium Thickness: 4 mm.  No focal abnormality visualized. Right ovary Enlarged to 47 x 44 x 64 mm, ~ 70 cc, with 2 hypoechoic areas measuring up to 4.8 and 2.3 cm, correlating with cystic appearing structures by CT. These both appear intra-ovarian and new from abdominal CT 03/29/2022. There are some internal echoes without color Doppler flow, suspect hemorrhagic cyst. Left ovary Measurements: 36 x 32 x 31 mm =  volume: 18 mL. Normal appearance/no adnexal mass. Pulsed Doppler evaluation demonstrates normal low-resistance arterial and venous waveforms in both ovaries. Other: Small volume pelvic fluid. IMPRESSION: 1. Normal ovarian blood flow. 2. Mildly complicated cysts in the right ovary measuring up to 4.5 cm, recommend follow-up in 6-12 weeks. 3. Uterine fibroids. Electronically Signed   By: Tiburcio Pea M.D.   On: 05/14/2023 11:59   US ABDOMINAL PELVIC ART/VENT FLOW DOPPLER  Result Date: 05/14/2023 CLINICAL DATA:  Pelvic mass EXAM: TRANSABDOMINAL ULTRASOUND OF PELVIS DOPPLER ULTRASOUND OF OVARIES TECHNIQUE: Transabdominal ultrasound examination of the pelvis was performed including evaluation of the uterus, ovaries, adnexal regions, and pelvic cul-de-sac. Color and duplex Doppler ultrasound was utilized to evaluate blood flow to the ovaries. COMPARISON:  CT from earlier today FINDINGS: Uterus Measurements: 8 x 4 x 5 cm = volume: 105 mL. Intramural masses measuring up to 4 cm at the fundus. Endometrium Thickness: 4 mm.  No focal abnormality visualized. Right ovary Enlarged to 47 x 44 x 64 mm, ~ 70 cc, with 2 hypoechoic areas measuring up to 4.8 and 2.3 cm, correlating with cystic appearing structures by CT. These both appear intra-ovarian and new from abdominal CT 03/29/2022. There are some internal echoes without color Doppler flow, suspect hemorrhagic cyst. Left ovary Measurements: 36 x 32 x 31 mm = volume: 18 mL. Normal appearance/no adnexal mass. Pulsed Doppler evaluation demonstrates normal low-resistance arterial and venous waveforms in both ovaries. Other: Small volume pelvic fluid. IMPRESSION: 1. Normal ovarian blood flow. 2. Mildly complicated cysts in the right ovary measuring up to 4.5 cm, recommend follow-up in 6-12 weeks.  3. Uterine fibroids. Electronically Signed   By: Tiburcio Pea M.D.   On: 05/14/2023 11:59   CT ABDOMEN PELVIS W CONTRAST  Result Date: 05/14/2023 CLINICAL DATA:  Abdominal  pain, acute, nonlocalized diffuse abdominal pain, hx pelvic abscess omplaining of lower abdominal pain and odorous vaginal discharge ongoing for 2 months since her miscarriage. EXAM: CT ABDOMEN AND PELVIS WITH CONTRAST TECHNIQUE: Multidetector CT imaging of the abdomen and pelvis was performed using the standard protocol following bolus administration of intravenous contrast. RADIATION DOSE REDUCTION: This exam was performed according to the departmental dose-optimization program which includes automated exposure control, adjustment of the mA and/or kV according to patient size and/or use of iterative reconstruction technique. CONTRAST:  OMNIPAQUE IOHEXOL 300 MG/ML  SOLN COMPARISON:  CT abdomen pelvis 03/29/2022 FINDINGS: Lower chest: No acute abnormality. Hepatobiliary: No focal liver abnormality. No gallstones, gallbladder wall thickening, or pericholecystic fluid. No biliary dilatation. Pancreas: No focal lesion. Normal pancreatic contour. No surrounding inflammatory changes. No main pancreatic ductal dilatation. Spleen: Normal in size without focal abnormality. Adrenals/Urinary Tract: No adrenal nodule bilaterally. Bilateral kidneys enhance symmetrically. No hydronephrosis. No hydroureter. The urinary bladder is unremarkable. Stomach/Bowel: Stomach is within normal limits. No evidence of bowel wall thickening or dilatation. Appendix appears normal. Vascular/Lymphatic: No abdominal aorta or iliac aneurysm. No abdominal, pelvic, or inguinal lymphadenopathy. Reproductive: Interval increase in size and conspicuity of a anterior uterine wall mass measuring up to 3.5 cm. Interval increase in size of 7.3 x 3.3 cm multiseptated cystic mass within the pelvis. Other: No intraperitoneal free fluid. No intraperitoneal free gas. No organized fluid collection. Musculoskeletal: Small fat containing ventral hernia within an abdominal defect of 0.3 cm (2:48). No suspicious lytic or blastic osseous lesions. No acute  displaced fracture. IMPRESSION: 1. Interval increase in size of a 7.3 x 3.3 cm multiseptated cystic mass within the pelvis. Recommend pelvic ultrasound for further evaluation. 2. Interval increase in size and conspicuity of a 3.5 cm anterior uterine wall mass. Finding could represent uterine fibroids versus leiomyosarcoma given increase in size. Recommend pelvic ultrasound for further evaluation. 3. Small fat containing ventral hernia within an abdominal defect of 0.3 cm. Electronically Signed   By: Tish Frederickson M.D.   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 Recommend Gyn oncology consult with their involvement should surgical management become an option. Antibiotic therapy for now and consider IR consult for drain placement if she does not improve in 24-48 hours. Will follow with you. Ob/Gyn attending [phone]  Scheryl Darter 05/14/2023

## 2023-05-14 NOTE — ED Notes (Signed)
ED TO INPATIENT HANDOFF REPORT  ED Nurse Name and Phone #: Lynnae Prude, RN [phone]  S Name/Age/Gender Paula Lucas 32 y.o. female Room/Bed: MH07/MH07  Code Status   Code Status: Prior  Home/SNF/Other Home Patient oriented to: self, place, time, and situation Is this baseline? Yes   Triage Complete: Triage complete  Chief Complaint Pain in pelvis [R10.2]  Triage Note Patient bib gcems complaining of lower abdominal pain and odorous vaginal discharge ongoing for 2 months since her miscarriage. Patient also states she has abscess on lower abdomen that she first noticed 2 days ago. Patient is also requesting to be checked to see if her ovarian cyst has come back that she had removed in July 2023 .   EMS Vitals  BP 120/76, HR 92, Spo2 98%, T 98.5, CBG 109   Allergies Allergies  Allergen Reactions   Fish Allergy Anaphylaxis    Severe allergic reaction to all seafoods   Motrin [Ibuprofen] Anaphylaxis    Pt tolerated Ketorolac 04/02/18 Pt tolerated toradol during hosp admission in July 2023   Mushroom Extract Complex Anaphylaxis   Shellfish-Derived Products Anaphylaxis   Tylenol [Acetaminophen] Anaphylaxis    Level of Care/Admitting Diagnosis ED Disposition     ED Disposition  Admit   Condition  --   Comment  Hospital Area: [facility] [[id number]]  Level of Care: Telemetry Surgical [105]  May admit patient to [facility] or [facility] if equivalent level of care is available:: No  Interfacility transfer: Yes  Covid Evaluation: Asymptomatic - no recent exposure (last 10 days) testing not required  Diagnosis: Pain in pelvis [710119]  Admitting Physician: Darlin Drop [[id number]]  Attending Physician: Darlin Drop [[id number]]  Certification:: I certify this patient will need inpatient services for at least 2 midnights  Expected Medical Readiness: 05/16/2023          B Medical/Surgery History Past Medical History:  Diagnosis Date   Asthma    BMI  31.0-31.9,adult    Chlamydia    Gonorrhea    TOA (tubo-ovarian abscess)    Past Surgical History:  Procedure Laterality Date   INDUCED ABORTION     IR RADIOLOGIST EVAL & MGMT  05/17/2018   LAPAROTOMY N/A 02/16/2022   Procedure: EXPLORATORY LAPAROTOMY WITH ABDOMINAL WASHOUT;  Surgeon: Reva Bores, MD;  Location: [facility] OR;  Service: Gynecology;  Laterality: N/A;   SMALL BOWEL REPAIR N/A 02/16/2022   Procedure: SMALL BOWEL REPAIR;  Surgeon: Violeta Gelinas, MD;  Location: [facility] OR;  Service: General;  Laterality: N/A;   TONSILLECTOMY       A IV Location/Drains/Wounds Patient Lines/Drains/Airways Status     Active Line/Drains/Airways     Name Placement date Placement time Site Days   Peripheral IV 20 G Anterior;Left;Proximal Forearm --  --  Forearm  --   Wound / Incision (Open or Dehisced) 03/13/22 Incision - Open Abdomen Lower;Medial Open Surgical Wound 03/13/22  1900  Abdomen  427            Intake/Output Last 24 hours  Intake/Output Summary (Last 24 hours) at 05/14/2023 0517 Last data filed at 05/14/2023 0500 Gross per 24 hour  Intake 1100 ml  Output --  Net 1100 ml    Labs/Imaging Results for orders placed or performed during the hospital encounter of 05/13/23 (from the past 48 hour(s))  Lipase, blood     Status: None   Collection Time: 05/13/23 10:07 PM  Result Value Ref Range   Lipase 29 11 -  51 U/L    Comment: Performed at [facility], [address] [place], [place] [address]  Comprehensive metabolic panel     Status: Abnormal   Collection Time: 05/13/23 10:07 PM  Result Value Ref Range   Sodium 138 135 - 145 mmol/L   Potassium 3.2 (L) 3.5 - 5.1 mmol/L   Chloride 105 98 - 111 mmol/L   CO2 22 22 - 32 mmol/L   Glucose, Bld 106 (H) 70 - 99 mg/dL    Comment: Glucose reference range applies only to samples taken after fasting for at least 8 hours.   BUN 9 6 - 20 mg/dL   Creatinine, Ser 4.74 0.44 - 1.00 mg/dL   Calcium 9.2 8.9 - 25.9 mg/dL   Total  Protein 8.1 6.5 - 8.1 g/dL   Albumin 4.2 3.5 - 5.0 g/dL   AST 17 15 - 41 U/L   ALT 11 0 - 44 U/L   Alkaline Phosphatase 45 38 - 126 U/L   Total Bilirubin 0.6 0.3 - 1.2 mg/dL   GFR, Estimated >56 >38 mL/min    Comment: (NOTE) Calculated using the CKD-EPI Creatinine Equation (2021)    Anion gap 11 5 - 15    Comment: Performed at [facility], [address] [place], [place] [address]  CBC     Status: None   Collection Time: 05/13/23 10:07 PM  Result Value Ref Range   WBC 10.0 4.0 - 10.5 K/uL   RBC 4.02 3.87 - 5.11 MIL/uL   Hemoglobin 12.7 12.0 - 15.0 g/dL   HCT 32.9 51.8 - 84.1 %   MCV 93.5 80.0 - 100.0 fL   MCH 31.6 26.0 - 34.0 pg   MCHC 33.8 30.0 - 36.0 g/dL   RDW 66.0 63.0 - 16.0 %   Platelets 373 150 - 400 K/uL   nRBC 0.0 0.0 - 0.2 %    Comment: Performed at [facility], [address] [place], [place] [address]  Wet prep, genital     Status: Abnormal   Collection Time: 05/14/23 12:07 AM  Result Value Ref Range   Yeast Wet Prep HPF POC NONE SEEN NONE SEEN   Trich, Wet Prep PRESENT (A) NONE SEEN   Clue Cells Wet Prep HPF POC NONE SEEN NONE SEEN   WBC, Wet Prep HPF POC >=10 (A) <10   Sperm NONE SEEN     Comment: Performed at [facility], [address] [place], [place] [address]  Lactic acid, plasma     Status: None   Collection Time: 05/14/23 12:43 AM  Result Value Ref Range   Lactic Acid, Venous 0.6 0.5 - 1.9 mmol/L    Comment: Performed at [facility], [address] [place], [place] [address]  hCG, serum, qualitative     Status: None   Collection Time: 05/14/23  1:58 AM  Result Value Ref Range   Preg, Serum NEGATIVE NEGATIVE    Comment:        THE SENSITIVITY OF THIS METHODOLOGY IS >10 mIU/mL. Performed at [facility], [address] [place], [place] [address]   Urinalysis, Routine w reflex microscopic -Urine, Clean Catch     Status: Abnormal   Collection Time: 05/14/23  4:57 AM  Result Value Ref  Range   Color, Urine YELLOW YELLOW   APPearance CLOUDY (A) CLEAR   Specific Gravity, Urine 1.010 1.005 - 1.030   pH 5.5  5.0 - 8.0   Glucose, UA NEGATIVE NEGATIVE mg/dL   Hgb urine dipstick TRACE (A) NEGATIVE   Bilirubin Urine NEGATIVE NEGATIVE   Ketones, ur NEGATIVE NEGATIVE mg/dL   Protein, ur NEGATIVE NEGATIVE mg/dL   Nitrite NEGATIVE NEGATIVE   Leukocytes,Ua NEGATIVE NEGATIVE    Comment: Performed at [facility], [address] [place], [place] [address]  Urinalysis, Microscopic (reflex)     Status: Abnormal   Collection Time: 05/14/23  4:57 AM  Result Value Ref Range   RBC / HPF 0-5 0 - 5 RBC/hpf   WBC, UA 21-50 0 - 5 WBC/hpf   Bacteria, UA FEW (A) NONE SEEN   Squamous Epithelial / HPF 11-20 0 - 5 /HPF   Trichomonas, UA PRESENT (A) NONE SEEN    Comment: Performed at [facility], [address] [place], [place] [address]   CT ABDOMEN PELVIS W CONTRAST  Result Date: 05/14/2023 CLINICAL DATA:  Abdominal pain, acute, nonlocalized diffuse abdominal pain, hx pelvic abscess omplaining of lower abdominal pain and odorous vaginal discharge ongoing for 2 months since her miscarriage. EXAM: CT ABDOMEN AND PELVIS WITH CONTRAST TECHNIQUE: Multidetector CT imaging of the abdomen and pelvis was performed using the standard protocol following bolus administration of intravenous contrast. RADIATION DOSE REDUCTION: This exam was performed according to the departmental dose-optimization program which includes automated exposure control, adjustment of the mA and/or kV according to patient size and/or use of iterative reconstruction technique. CONTRAST:  OMNIPAQUE IOHEXOL 300 MG/ML  SOLN COMPARISON:  CT abdomen pelvis 03/29/2022 FINDINGS: Lower chest: No acute abnormality. Hepatobiliary: No focal liver abnormality. No gallstones, gallbladder wall thickening, or pericholecystic fluid. No biliary dilatation. Pancreas: No focal lesion. Normal pancreatic contour. No surrounding  inflammatory changes. No main pancreatic ductal dilatation. Spleen: Normal in size without focal abnormality. Adrenals/Urinary Tract: No adrenal nodule bilaterally. Bilateral kidneys enhance symmetrically. No hydronephrosis. No hydroureter. The urinary bladder is unremarkable. Stomach/Bowel: Stomach is within normal limits. No evidence of bowel wall thickening or dilatation. Appendix appears normal. Vascular/Lymphatic: No abdominal aorta or iliac aneurysm. No abdominal, pelvic, or inguinal lymphadenopathy. Reproductive: Interval increase in size and conspicuity of a anterior uterine wall mass measuring up to 3.5 cm. Interval increase in size of 7.3 x 3.3 cm multiseptated cystic mass within the pelvis. Other: No intraperitoneal free fluid. No intraperitoneal free gas. No organized fluid collection. Musculoskeletal: Small fat containing ventral hernia within an abdominal defect of 0.3 cm (2:48). No suspicious lytic or blastic osseous lesions. No acute displaced fracture. IMPRESSION: 1. Interval increase in size of a 7.3 x 3.3 cm multiseptated cystic mass within the pelvis. Recommend pelvic ultrasound for further evaluation. 2. Interval increase in size and conspicuity of a 3.5 cm anterior uterine wall mass. Finding could represent uterine fibroids versus leiomyosarcoma given increase in size. Recommend pelvic ultrasound for further evaluation. 3. Small fat containing ventral hernia within an abdominal defect of 0.3 cm. Electronically Signed   By: Tish Frederickson M.D.   On: 05/14/2023 03:43    Pending Labs Unresulted Labs (From admission, onward)     Start     Ordered   05/13/23 2349  Blood culture (routine x 2)  BLOOD CULTURE X 2,   STAT      05/13/23 2349            Vitals/Pain Today's Vitals   05/14/23 0415 05/14/23 0436 05/14/23 0436 05/14/23 0500  BP: (!) 116/101   99/77  Pulse: 81  74  Resp:      Temp:      TempSrc:      SpO2: 100%   100%  Weight:      Height:      PainSc:  8  8       Isolation Precautions No active isolations  Medications Medications  lidocaine-EPINEPHrine (XYLOCAINE W/EPI) 2 %-1:200000 (PF) injection 20 mL (20 mLs Infiltration Given 05/14/23 0039)  sodium chloride 0.9 % bolus 1,000 mL (0 mLs Intravenous Stopped 05/14/23 0220)  HYDROmorphone (DILAUDID) injection 1 mg (1 mg Intravenous Given 05/14/23 0024)  ondansetron (ZOFRAN) injection 4 mg (4 mg Intravenous Given 05/14/23 0027)  iohexol (OMNIPAQUE) 300 MG/ML solution 100 mL (100 mLs Intravenous Contrast Given 05/14/23 0333)  cefTRIAXone (ROCEPHIN) 1 g in sodium chloride 0.9 % 100 mL IVPB (0 g Intravenous Stopped 05/14/23 0500)  metroNIDAZOLE (FLAGYL) tablet 2,000 mg (2,000 mg Oral Given 05/14/23 0416)  doxycycline (VIBRA-TABS) tablet 100 mg (100 mg Oral Given 05/14/23 0415)  HYDROmorphone (DILAUDID) injection 1 mg (1 mg Intravenous Given 05/14/23 0413)    Mobility walks     Focused Assessments Pt presents c/o lower abdominal pain with abscess to upper groin for two months. Foul odor noted to vagina, no discharged noted at this time.    R Recommendations: See Admitting Provider Note  Report given to:   Additional Notes: Pt being sent for pelvic US. Pt is GCS 15, NAD.

## 2023-05-15 DIAGNOSIS — R102 Pelvic and perineal pain: Secondary | ICD-10-CM | POA: Diagnosis not present

## 2023-05-15 DIAGNOSIS — N739 Female pelvic inflammatory disease, unspecified: Secondary | ICD-10-CM

## 2023-05-15 LAB — CBC
HCT: 33.2 % — ABNORMAL LOW (ref 36.0–46.0)
Hemoglobin: 10.7 g/dL — ABNORMAL LOW (ref 12.0–15.0)
MCH: 31.4 pg (ref 26.0–34.0)
MCHC: 32.2 g/dL (ref 30.0–36.0)
MCV: 97.4 fL (ref 80.0–100.0)
Platelets: 284 10*3/uL (ref 150–400)
RBC: 3.41 MIL/uL — ABNORMAL LOW (ref 3.87–5.11)
RDW: 12.8 % (ref 11.5–15.5)
WBC: 5.9 10*3/uL (ref 4.0–10.5)
nRBC: 0 % (ref 0.0–0.2)

## 2023-05-15 LAB — COMPREHENSIVE METABOLIC PANEL
ALT: 9 U/L (ref 0–44)
AST: 15 U/L (ref 15–41)
Albumin: 2.9 g/dL — ABNORMAL LOW (ref 3.5–5.0)
Alkaline Phosphatase: 38 U/L (ref 38–126)
Anion gap: 7 (ref 5–15)
BUN: <5 mg/dL — ABNORMAL LOW (ref 6–20)
CO2: 20 mmol/L — ABNORMAL LOW (ref 22–32)
Calcium: 7.9 mg/dL — ABNORMAL LOW (ref 8.9–10.3)
Chloride: 110 mmol/L (ref 98–111)
Creatinine, Ser: 0.72 mg/dL (ref 0.44–1.00)
GFR, Estimated: >60 mL/min (ref >60)
Glucose, Bld: 59 mg/dL — ABNORMAL LOW (ref 70–99)
Potassium: 3.6 mmol/L (ref 3.5–5.1)
Sodium: 137 mmol/L (ref 135–145)
Total Bilirubin: 0.5 mg/dL (ref 0.3–1.2)
Total Protein: 5.8 g/dL — ABNORMAL LOW (ref 6.5–8.1)

## 2023-05-15 LAB — GLUCOSE, CAPILLARY
Glucose-Capillary: 98 mg/dL (ref 70–99)
Glucose-Capillary: 88 mg/dL (ref 70–99)

## 2023-05-15 MED ORDER — CEFDINIR 300 MG PO CAPS: 300.0000 mg | ORAL_CAPSULE | Freq: Two times a day (BID) | ORAL | 0 refills | Status: AC

## 2023-05-15 MED ORDER — METRONIDAZOLE 500 MG PO TABS: 500.0000 mg | ORAL_TABLET | Freq: Two times a day (BID) | ORAL | 0 refills | Status: AC

## 2023-05-15 MED ORDER — DOXYCYCLINE HYCLATE 100 MG PO TABS: 100.0000 mg | ORAL_TABLET | Freq: Two times a day (BID) | ORAL | 0 refills | Status: AC

## 2023-05-15 NOTE — Progress Notes (Signed)
Subjective: Patient reports tolerating PO and no problems voiding.   Low abdominal pain Objective: I have reviewed patient's vital signs, medications, labs, and radiology results. Vitals:   05/14/23 2045 05/15/23 0504  BP: (!) 121/96 108/80  Pulse: 70 60  Resp: 20 20  Temp: (!) 97.5 F (36.4 C) 98.2 F (36.8 C)  SpO2: 100% 98%    General: alert, cooperative, and mild distress Resp: effort normal Cardio: regular rate and rhythm GI: abnormal findings:  mild tenderness in the lower abdomen CBC    Component Value Date/Time   WBC 6.8 05/14/2023 1635   RBC 3.43 (L) 05/14/2023 1635   HGB 10.6 (L) 05/14/2023 1635   HGB 11.2 04/13/2018 1512   HCT 32.1 (L) 05/14/2023 1635   HCT 35.0 04/13/2018 1512   PLT 299 05/14/2023 1635   PLT 437 04/13/2018 1512   MCV 93.6 05/14/2023 1635   MCV 92 04/13/2018 1512   MCH 30.9 05/14/2023 1635   MCHC 33.0 05/14/2023 1635   RDW 12.9 05/14/2023 1635   RDW 15.8 (H) 04/13/2018 1512   LYMPHSABS 3.5 05/14/2023 1635   LYMPHSABS 4.7 (H) 04/13/2018 1512   MONOABS 0.4 05/14/2023 1635   EOSABS 0.3 05/14/2023 1635   EOSABS 0.3 04/13/2018 1512   BASOSABS 0.1 05/14/2023 1635   BASOSABS 0.0 04/13/2018 1512     Assessment/Plan: Appreciate Dr. Marcia Brash consult. Afebrile and WBC not elevated. Day 2 antibiotics. If not clinically improving tomorrow consider consult IR for drain placement   LOS: 1 day    Scheryl Darter, MD 05/15/2023, 11:03 AM

## 2023-05-15 NOTE — Plan of Care (Signed)
Left AMA.

## 2023-05-15 NOTE — Progress Notes (Signed)
Patient notified me at 0250 that she received a call from her mother informing her that her grandfather passed away. She states that she need to leave AMA. Patient states if she does not leave to be with her family her life circumstances after hospitalization would be at risk. Patient stated she wanted to leave AMA. I notified attending MD Rickey Barbara after urging her to not leave and consider health if condition worsens. MD called patient and spoke to patient concerning potential risk and medications. Patient  requested IV removed and leaving AMA. AMA paper was then signed after reviewing with client and IV removed. Patient walked from room on her own to elevators leaving unit.

## 2023-05-15 NOTE — Discharge Summary (Signed)
Physician Discharge Summary   Patient: Paula Lucas MRN: [id number] DOB: 1991/08/02  Admit date:     05/13/2023  Discharge date: 05/15/23  Discharge Physician: Rickey Barbara   PCP: Marcine Matar, MD   Patient Left Against Medical Advice  Discharge Diagnoses: Principal Problem:   Pain in pelvis Active Problems:   Abdominal pain   Depression   Hypokalemia   Schizoaffective disorder, bipolar type (HCC)   Type 2 diabetes mellitus without complication, with long-term current use of insulin (HCC)   Suprapubic abscess  Resolved Problems:   * No resolved hospital problems. *  Hospital Course: 32 y.o. female with medical history significant of multiple intra-abdominal/pelvic abscesses for related to PID, history of ex lap with pelvic and abdominal washout and bowel adhesions status post lysis, 01/22/2022, history of hypokalemia presented to the ED with a 60-month history of progressively worsening lower abdominal and back pain.  Patient denies any fevers, no chills, no vomiting, no chest pain, no shortness of breath, no melena, no hematemesis, no hematochezia, no syncopal episode.  Patient does endorse decreased oral intake, generalized weakness, lightheadedness, headaches, thick vaginal whitish discharge also noted. CT abdomen and pelvis done with interval increase in size of 7.3 x 3.3 cm multiseptated cystic mass within the pelvis, recommend pelvic ultrasound for further evaluation, interval increase in size and conspicuity of 3.5 cm anterior uterine wall mass.   Assessment and Plan: #1 abdominal/pelvic pain -Patient presenting with a 73-month history of worsening abdominal/pelvic pain. -Patient with prior history of pelvic abscesses status post ex lap with washout with drain placement before. -CT abdomen and pelvis done with interval increase in size of 7.3 x 3.3 cm multiseptated cystic mass within the pelvis, interval increase in size and conspicuity of 3.5 cm anterior uterine wall mass  findings could represent uterine fibroids versus leiomyosarcoma given increasing size.   -Pt was seen by Ob/Gyn and Gyn-Onc. Recommendations were to continue patient on IV antibiotics. No obvious abscess or indication for surgical intervention per Gyn-Onc -Gyn had recommended possible drain placement should symptoms not improve by 9/23 -on the afternoon of 9/22, patient decided to leave AMA, claiming to have a sudden death in the family thus needing to leave. RN spoke to pt. I also spoke at length with pt over phone. Pt understands that she needs continued IV antibiotics and that possible drain was recommended, necessitating stay here. Pt remained adamant about leaving, demonstrating insight and capacity to make decisions. Pt was offered rx for PO abx if leaving AMA. She understands that PO abx are inferior to IV antibiotics recommended, and urged pt to reconsider. Pt still decided she will leave. Pt understands to return to ED as needed.   2.  Trichomonas cystitis -Status post 2 g IV metronidazole in ED.   3.  Dehydration -Pt was given IV fluids.   4.  Hypokalemia -given replacement   5.  Schizoaffective disorder, bipolar type -Stable. -Resumed home regimen Seroquel and Remeron,   6.  Suprapubic abscess - s/p I&D in the ED.   7.  Diabetes mellitus type 2 -A1c 5.0 -Patient states takes 70/3030 units twice daily. -Placed on sliding scale insulin this visit    Consultants: Ob/Gyn, Gyn-Onc Procedures performed:   Disposition: Left AMA DISCHARGE MEDICATION:   Discharge Exam: Filed Weights   05/13/23 2158 05/15/23 0454  Weight: 81.6 kg 71.7 kg   General exam: Awake, laying in bed, in nad Respiratory system: Normal respiratory effort, no wheezing Cardiovascular system: regular rate, s1, s2  Gastrointestinal system: generally tender, nondistended, positive BS Central nervous system: CN2-12 grossly intact, strength intact Extremities: Perfused, no clubbing Skin: Normal skin turgor,  no notable skin lesions seen Psychiatry: Mood normal // no visual hallucinations     The results of significant diagnostics from this hospitalization (including imaging, microbiology, ancillary and laboratory) are listed below for reference.   Imaging Studies: US PELVIS (TRANSABDOMINAL ONLY)  Result Date: 05/14/2023 CLINICAL DATA:  Pelvic mass EXAM: TRANSABDOMINAL ULTRASOUND OF PELVIS DOPPLER ULTRASOUND OF OVARIES TECHNIQUE: Transabdominal ultrasound examination of the pelvis was performed including evaluation of the uterus, ovaries, adnexal regions, and pelvic cul-de-sac. Color and duplex Doppler ultrasound was utilized to evaluate blood flow to the ovaries. COMPARISON:  CT from earlier today FINDINGS: Uterus Measurements: 8 x 4 x 5 cm = volume: 105 mL. Intramural masses measuring up to 4 cm at the fundus. Endometrium Thickness: 4 mm.  No focal abnormality visualized. Right ovary Enlarged to 47 x 44 x 64 mm, ~ 70 cc, with 2 hypoechoic areas measuring up to 4.8 and 2.3 cm, correlating with cystic appearing structures by CT. These both appear intra-ovarian and new from abdominal CT 03/29/2022. There are some internal echoes without color Doppler flow, suspect hemorrhagic cyst. Left ovary Measurements: 36 x 32 x 31 mm = volume: 18 mL. Normal appearance/no adnexal mass. Pulsed Doppler evaluation demonstrates normal low-resistance arterial and venous waveforms in both ovaries. Other: Small volume pelvic fluid. IMPRESSION: 1. Normal ovarian blood flow. 2. Mildly complicated cysts in the right ovary measuring up to 4.5 cm, recommend follow-up in 6-12 weeks. 3. Uterine fibroids. Electronically Signed   By: Tiburcio Pea M.D.   On: 05/14/2023 11:59   US ABDOMINAL PELVIC ART/VENT FLOW DOPPLER  Result Date: 05/14/2023 CLINICAL DATA:  Pelvic mass EXAM: TRANSABDOMINAL ULTRASOUND OF PELVIS DOPPLER ULTRASOUND OF OVARIES TECHNIQUE: Transabdominal ultrasound examination of the pelvis was performed including  evaluation of the uterus, ovaries, adnexal regions, and pelvic cul-de-sac. Color and duplex Doppler ultrasound was utilized to evaluate blood flow to the ovaries. COMPARISON:  CT from earlier today FINDINGS: Uterus Measurements: 8 x 4 x 5 cm = volume: 105 mL. Intramural masses measuring up to 4 cm at the fundus. Endometrium Thickness: 4 mm.  No focal abnormality visualized. Right ovary Enlarged to 47 x 44 x 64 mm, ~ 70 cc, with 2 hypoechoic areas measuring up to 4.8 and 2.3 cm, correlating with cystic appearing structures by CT. These both appear intra-ovarian and new from abdominal CT 03/29/2022. There are some internal echoes without color Doppler flow, suspect hemorrhagic cyst. Left ovary Measurements: 36 x 32 x 31 mm = volume: 18 mL. Normal appearance/no adnexal mass. Pulsed Doppler evaluation demonstrates normal low-resistance arterial and venous waveforms in both ovaries. Other: Small volume pelvic fluid. IMPRESSION: 1. Normal ovarian blood flow. 2. Mildly complicated cysts in the right ovary measuring up to 4.5 cm, recommend follow-up in 6-12 weeks. 3. Uterine fibroids. Electronically Signed   By: Tiburcio Pea M.D.   On: 05/14/2023 11:59   CT ABDOMEN PELVIS W CONTRAST  Result Date: 05/14/2023 CLINICAL DATA:  Abdominal pain, acute, nonlocalized diffuse abdominal pain, hx pelvic abscess omplaining of lower abdominal pain and odorous vaginal discharge ongoing for 2 months since her miscarriage. EXAM: CT ABDOMEN AND PELVIS WITH CONTRAST TECHNIQUE: Multidetector CT imaging of the abdomen and pelvis was performed using the standard protocol following bolus administration of intravenous contrast. RADIATION DOSE REDUCTION: This exam was performed according to the departmental dose-optimization program which includes automated exposure control,  adjustment of the mA and/or kV according to patient size and/or use of iterative reconstruction technique. CONTRAST:  OMNIPAQUE IOHEXOL 300 MG/ML  SOLN COMPARISON:   CT abdomen pelvis 03/29/2022 FINDINGS: Lower chest: No acute abnormality. Hepatobiliary: No focal liver abnormality. No gallstones, gallbladder wall thickening, or pericholecystic fluid. No biliary dilatation. Pancreas: No focal lesion. Normal pancreatic contour. No surrounding inflammatory changes. No main pancreatic ductal dilatation. Spleen: Normal in size without focal abnormality. Adrenals/Urinary Tract: No adrenal nodule bilaterally. Bilateral kidneys enhance symmetrically. No hydronephrosis. No hydroureter. The urinary bladder is unremarkable. Stomach/Bowel: Stomach is within normal limits. No evidence of bowel wall thickening or dilatation. Appendix appears normal. Vascular/Lymphatic: No abdominal aorta or iliac aneurysm. No abdominal, pelvic, or inguinal lymphadenopathy. Reproductive: Interval increase in size and conspicuity of a anterior uterine wall mass measuring up to 3.5 cm. Interval increase in size of 7.3 x 3.3 cm multiseptated cystic mass within the pelvis. Other: No intraperitoneal free fluid. No intraperitoneal free gas. No organized fluid collection. Musculoskeletal: Small fat containing ventral hernia within an abdominal defect of 0.3 cm (2:48). No suspicious lytic or blastic osseous lesions. No acute displaced fracture. IMPRESSION: 1. Interval increase in size of a 7.3 x 3.3 cm multiseptated cystic mass within the pelvis. Recommend pelvic ultrasound for further evaluation. 2. Interval increase in size and conspicuity of a 3.5 cm anterior uterine wall mass. Finding could represent uterine fibroids versus leiomyosarcoma given increase in size. Recommend pelvic ultrasound for further evaluation. 3. Small fat containing ventral hernia within an abdominal defect of 0.3 cm. Electronically Signed   By: Tish Frederickson M.D.   On: 05/14/2023 03:43    Microbiology: Results for orders placed or performed during the hospital encounter of 05/13/23  Wet prep, genital     Status: Abnormal   Collection  Time: 05/14/23 12:07 AM  Result Value Ref Range Status   Yeast Wet Prep HPF POC NONE SEEN NONE SEEN Final   Trich, Wet Prep PRESENT (A) NONE SEEN Final   Clue Cells Wet Prep HPF POC NONE SEEN NONE SEEN Final   WBC, Wet Prep HPF POC >=10 (A) <10 Final   Sperm NONE SEEN  Final    Comment: Performed at [facility], [address] [place], [place] [address]  Blood culture (routine x 2)     Status: None (Preliminary result)   Collection Time: 05/14/23 12:15 AM   Specimen: BLOOD  Result Value Ref Range Status   Specimen Description   Final    BLOOD RIGHT ANTECUBITAL Performed at [facility], [address] [place], [place] [address]    Special Requests   Final    BOTTLES DRAWN AEROBIC AND ANAEROBIC Blood Culture adequate volume Performed at [facility], [address] [place], [place] [address]    Culture   Final    NO GROWTH < 24 HOURS Performed at [facility], [address] [place], [place] [address]    Report Status PENDING  Incomplete  Blood culture (routine x 2)     Status: None (Preliminary result)   Collection Time: 05/14/23 12:25 AM   Specimen: BLOOD  Result Value Ref Range Status   Specimen Description   Final    BLOOD SITE NOT SPECIFIED Performed at [facility], [address] [place], [place] [address]    Special Requests   Final    BOTTLES DRAWN AEROBIC AND ANAEROBIC Blood Culture adequate volume Performed at  [facility], [address] [place], [place] [address]    Culture   Final    NO GROWTH < 24 HOURS Performed at [facility], [address], [place], [place] [address]    Report Status PENDING  Incomplete    Labs: CBC: Recent Labs  Lab 05/13/23 2207 05/14/23 1635 05/15/23 0313  WBC 10.0 6.8 5.9  NEUTROABS  --  2.6  --   HGB 12.7 10.6* 10.7*  HCT 37.6 32.1* 33.2*  MCV 93.5 93.6 97.4  PLT 373 299 284   Basic Metabolic Panel: Recent Labs  Lab 05/13/23 2207  05/14/23 1635 05/15/23 0313  NA 138 139 137  K 3.2* 3.8 3.6  CL 105 107 110  CO2 22 21* 20*  GLUCOSE 106* 89 59*  BUN 9 7 <5*  CREATININE 0.71 0.74 0.72  CALCIUM 9.2 8.6* 7.9*  MG  --  2.0  --   PHOS  --  3.5  --    Liver Function Tests: Recent Labs  Lab 05/13/23 2207 05/14/23 1635 05/15/23 0313  AST 17 13* 15  ALT 11 9 9   ALKPHOS 45 44 38  BILITOT 0.6 0.2* 0.5  PROT 8.1 6.0* 5.8*  ALBUMIN 4.2 3.0* 2.9*   CBG: Recent Labs  Lab 05/14/23 1220 05/14/23 1731 05/14/23 2159 05/15/23 0722 05/15/23 1256  GLUCAP 104* 90 86 98 88    Discharge time spent: less than 30 minutes.  Signed: Rickey Barbara, MD [facility] 05/15/2023

## 2023-05-15 NOTE — Plan of Care (Signed)
  Problem: Education: Goal: Knowledge of General Education information will improve Description: Including pain rating scale, medication(s)/side effects and non-pharmacologic comfort measures Outcome: Progressing   Problem: Health Behavior/Discharge Planning: Goal: Ability to manage health-related needs will improve Outcome: Progressing   Problem: Clinical Measurements: Goal: Ability to maintain clinical measurements within normal limits will improve Outcome: Progressing Goal: Will remain free from infection Outcome: Progressing Goal: Diagnostic test results will improve Outcome: Progressing Goal: Respiratory complications will improve Outcome: Progressing Goal: Cardiovascular complication will be avoided Outcome: Progressing   Problem: Activity: Goal: Risk for activity intolerance will decrease Outcome: Progressing   Problem: Nutrition: Goal: Adequate nutrition will be maintained Outcome: Progressing   Problem: Coping: Goal: Level of anxiety will decrease Outcome: Progressing   Problem: Elimination: Goal: Will not experience complications related to bowel motility Outcome: Progressing Goal: Will not experience complications related to urinary retention Outcome: Progressing   Problem: Pain Managment: Goal: General experience of comfort will improve Outcome: Progressing   Problem: Safety: Goal: Ability to remain free from injury will improve Outcome: Progressing   Problem: Skin Integrity: Goal: Risk for impaired skin integrity will decrease Outcome: Progressing   Problem: Education: Goal: Ability to describe self-care measures that may prevent or decrease complications (Diabetes Survival Skills Education) will improve Outcome: Progressing Goal: Individualized Educational Video(s) Outcome: Progressing

## 2023-05-15 NOTE — Hospital Course (Signed)
32 y.o. female with medical history significant of multiple intra-abdominal/pelvic abscesses for related to PID, history of ex lap with pelvic and abdominal washout and bowel adhesions status post lysis, 01/22/2022, history of hypokalemia presented to the ED with a 20-month history of progressively worsening lower abdominal and back pain.  Patient denies any fevers, no chills, no vomiting, no chest pain, no shortness of breath, no melena, no hematemesis, no hematochezia, no syncopal episode.  Patient does endorse decreased oral intake, generalized weakness, lightheadedness, headaches, thick vaginal whitish discharge also noted. CT abdomen and pelvis done with interval increase in size of 7.3 x 3.3 cm multiseptated cystic mass within the pelvis, recommend pelvic ultrasound for further evaluation, interval increase in size and conspicuity of 3.5 cm anterior uterine wall mass.

## 2023-05-16 ENCOUNTER — Emergency Department (HOSPITAL_COMMUNITY)
Admission: EM | Admit: 2023-05-16 | Discharge: 2023-05-16 | Discharge status: Against Medical Advice | Payer: 59 | Attending: Emergency Medicine | Admitting: Emergency Medicine

## 2023-05-16 ENCOUNTER — Other Ambulatory Visit: Payer: Self-pay

## 2023-05-16 ENCOUNTER — Emergency Department (HOSPITAL_COMMUNITY): Payer: 59

## 2023-05-16 ENCOUNTER — Encounter (HOSPITAL_COMMUNITY): Payer: Self-pay | Admitting: Emergency Medicine

## 2023-05-16 DIAGNOSIS — N83202 Unspecified ovarian cyst, left side: Secondary | ICD-10-CM | POA: Insufficient documentation

## 2023-05-16 DIAGNOSIS — N83201 Unspecified ovarian cyst, right side: Secondary | ICD-10-CM | POA: Insufficient documentation

## 2023-05-16 DIAGNOSIS — M549 Dorsalgia, unspecified: Secondary | ICD-10-CM | POA: Insufficient documentation

## 2023-05-16 DIAGNOSIS — R1030 Lower abdominal pain, unspecified: Secondary | ICD-10-CM | POA: Diagnosis not present

## 2023-05-16 DIAGNOSIS — I10 Essential (primary) hypertension: Secondary | ICD-10-CM | POA: Diagnosis not present

## 2023-05-16 DIAGNOSIS — R1084 Generalized abdominal pain: Secondary | ICD-10-CM | POA: Diagnosis not present

## 2023-05-16 DIAGNOSIS — Z5321 Procedure and treatment not carried out due to patient leaving prior to being seen by health care provider: Secondary | ICD-10-CM | POA: Insufficient documentation

## 2023-05-16 LAB — GC/CHLAMYDIA PROBE AMP (~~LOC~~) NOT AT ARMC
Chlamydia: NEGATIVE
Comment: NEGATIVE
Comment: NORMAL
Neisseria Gonorrhea: NEGATIVE

## 2023-05-16 LAB — COMPREHENSIVE METABOLIC PANEL
ALT: 12 U/L (ref 0–44)
AST: 20 U/L (ref 15–41)
Albumin: 3.6 g/dL (ref 3.5–5.0)
Alkaline Phosphatase: 44 U/L (ref 38–126)
Anion gap: 11 (ref 5–15)
BUN: <5 mg/dL — ABNORMAL LOW (ref 6–20)
CO2: 22 mmol/L (ref 22–32)
Calcium: 9 mg/dL (ref 8.9–10.3)
Chloride: 103 mmol/L (ref 98–111)
Creatinine, Ser: 0.76 mg/dL (ref 0.44–1.00)
GFR, Estimated: >60 mL/min (ref >60)
Glucose, Bld: 88 mg/dL (ref 70–99)
Potassium: 3.4 mmol/L — ABNORMAL LOW (ref 3.5–5.1)
Sodium: 136 mmol/L (ref 135–145)
Total Bilirubin: 0.7 mg/dL (ref 0.3–1.2)
Total Protein: 7 g/dL (ref 6.5–8.1)

## 2023-05-16 LAB — CBC WITH DIFFERENTIAL/PLATELET
Abs Immature Granulocytes: 0.02 10*3/uL (ref 0.00–0.07)
Basophils Absolute: 0.1 10*3/uL (ref 0.0–0.1)
Basophils Relative: 1 %
Eosinophils Absolute: 0.1 10*3/uL (ref 0.0–0.5)
Eosinophils Relative: 2 %
HCT: 34.7 % — ABNORMAL LOW (ref 36.0–46.0)
Hemoglobin: 11.6 g/dL — ABNORMAL LOW (ref 12.0–15.0)
Immature Granulocytes: 0 %
Lymphocytes Relative: 33 %
Lymphs Abs: 1.8 10*3/uL (ref 0.7–4.0)
MCH: 31.9 pg (ref 26.0–34.0)
MCHC: 33.4 g/dL (ref 30.0–36.0)
MCV: 95.3 fL (ref 80.0–100.0)
Monocytes Absolute: 0.2 10*3/uL (ref 0.1–1.0)
Monocytes Relative: 5 %
Neutro Abs: 3.2 10*3/uL (ref 1.7–7.7)
Neutrophils Relative %: 59 %
Platelets: 318 10*3/uL (ref 150–400)
RBC: 3.64 MIL/uL — ABNORMAL LOW (ref 3.87–5.11)
RDW: 12.3 % (ref 11.5–15.5)
WBC: 5.4 10*3/uL (ref 4.0–10.5)
nRBC: 0 % (ref 0.0–0.2)

## 2023-05-16 LAB — HCG, SERUM, QUALITATIVE: Preg, Serum: NEGATIVE

## 2023-05-16 LAB — LIPASE, BLOOD: Lipase: 25 U/L (ref 11–51)

## 2023-05-16 NOTE — ED Triage Notes (Signed)
Patient with lower bilateral abdominal pain that has been ongoing with known bilateral ovarian cysts. Was admitted to [facility] yesterday and scheduled for removal of the ovaries but left AMA. Patient back now with pain. VSS per EMS.

## 2023-05-19 LAB — CULTURE, BLOOD (ROUTINE X 2)
Culture: NO GROWTH
Culture: NO GROWTH
Special Requests: ADEQUATE
Special Requests: ADEQUATE

## 2023-07-26 IMAGING — CR DG CHEST 2V
2 series · 2 of 2 positions shown · non-contrast
Comparison: Chest x-ray 05/28/2020

CLINICAL DATA: Chest pain and coughing.

EXAM:
CHEST - 2 VIEW

[chest pa]
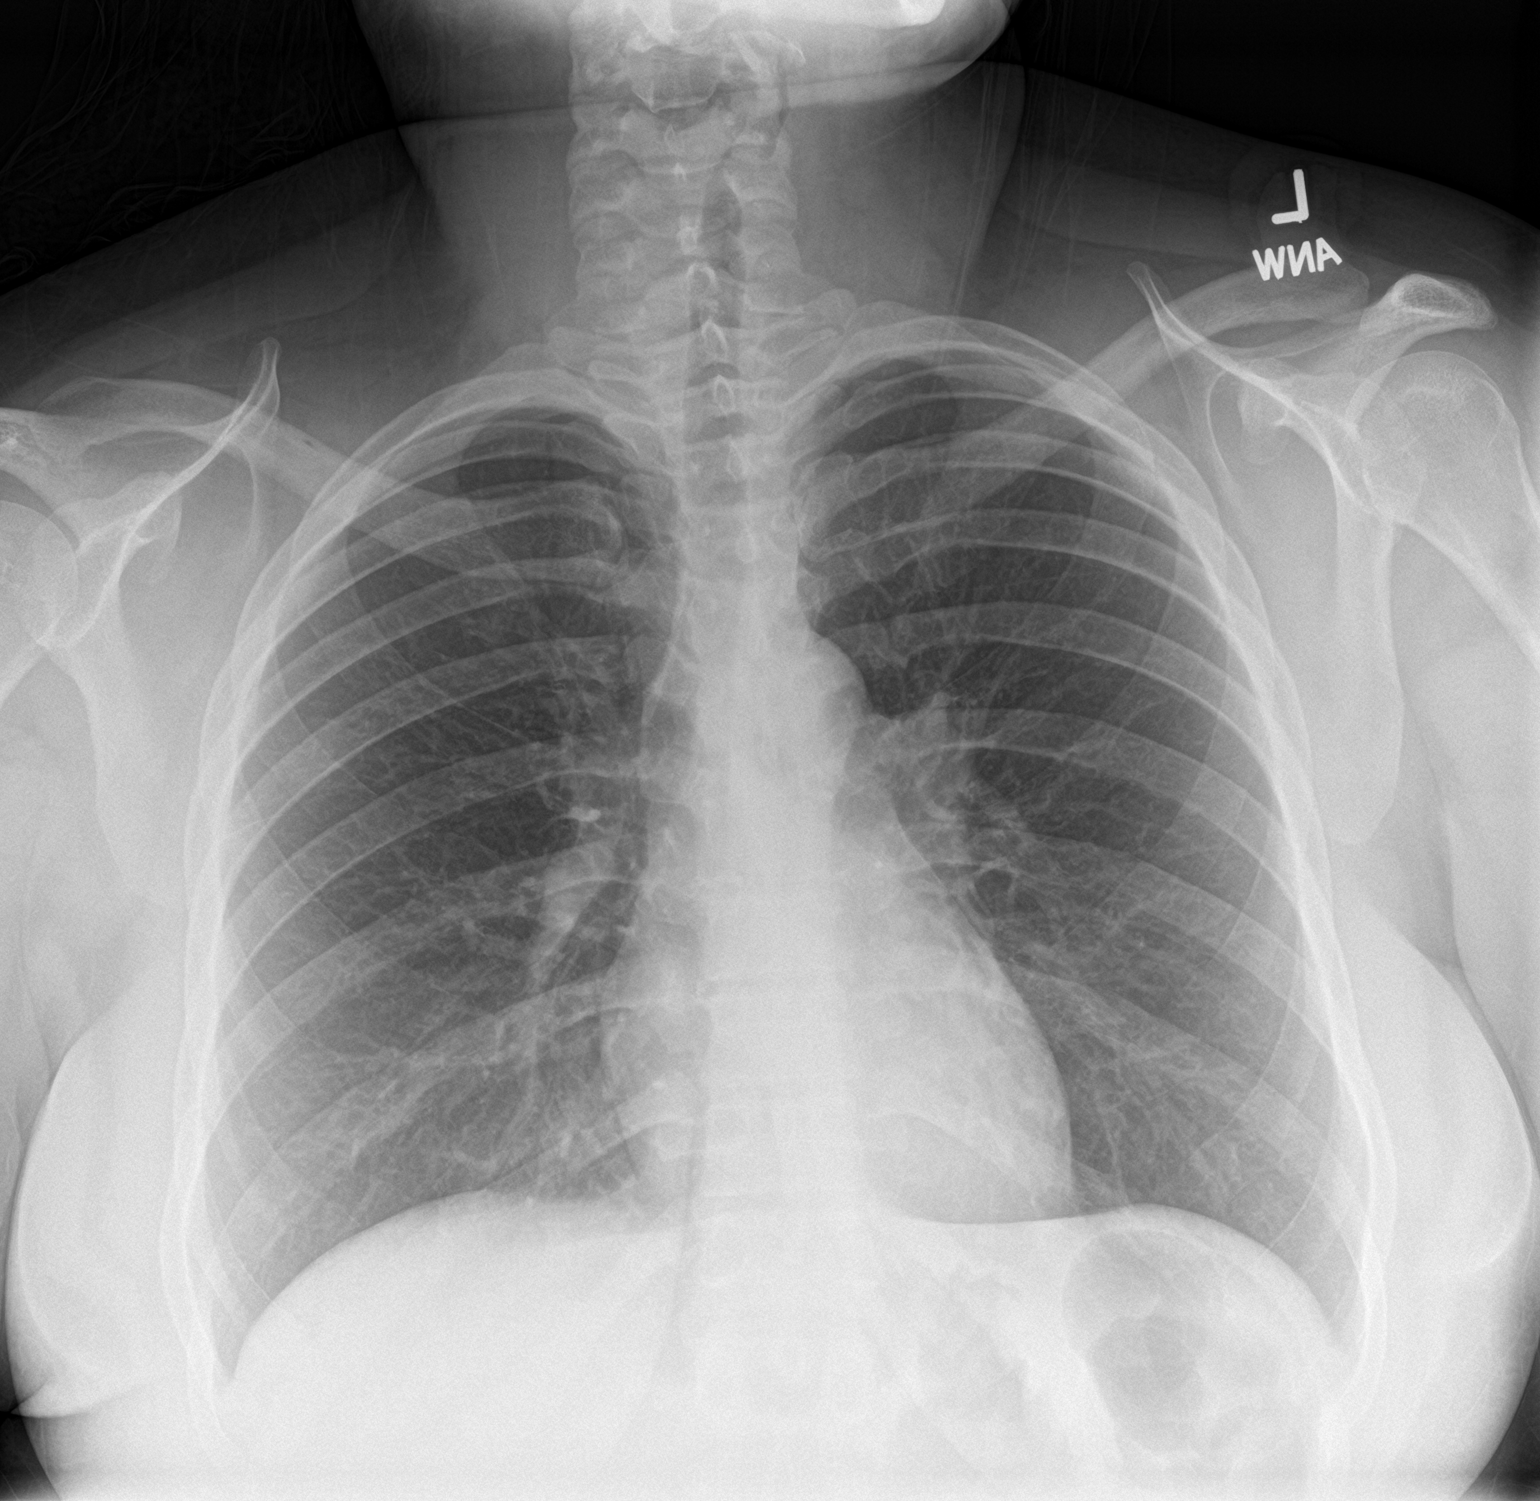

[chest lat]
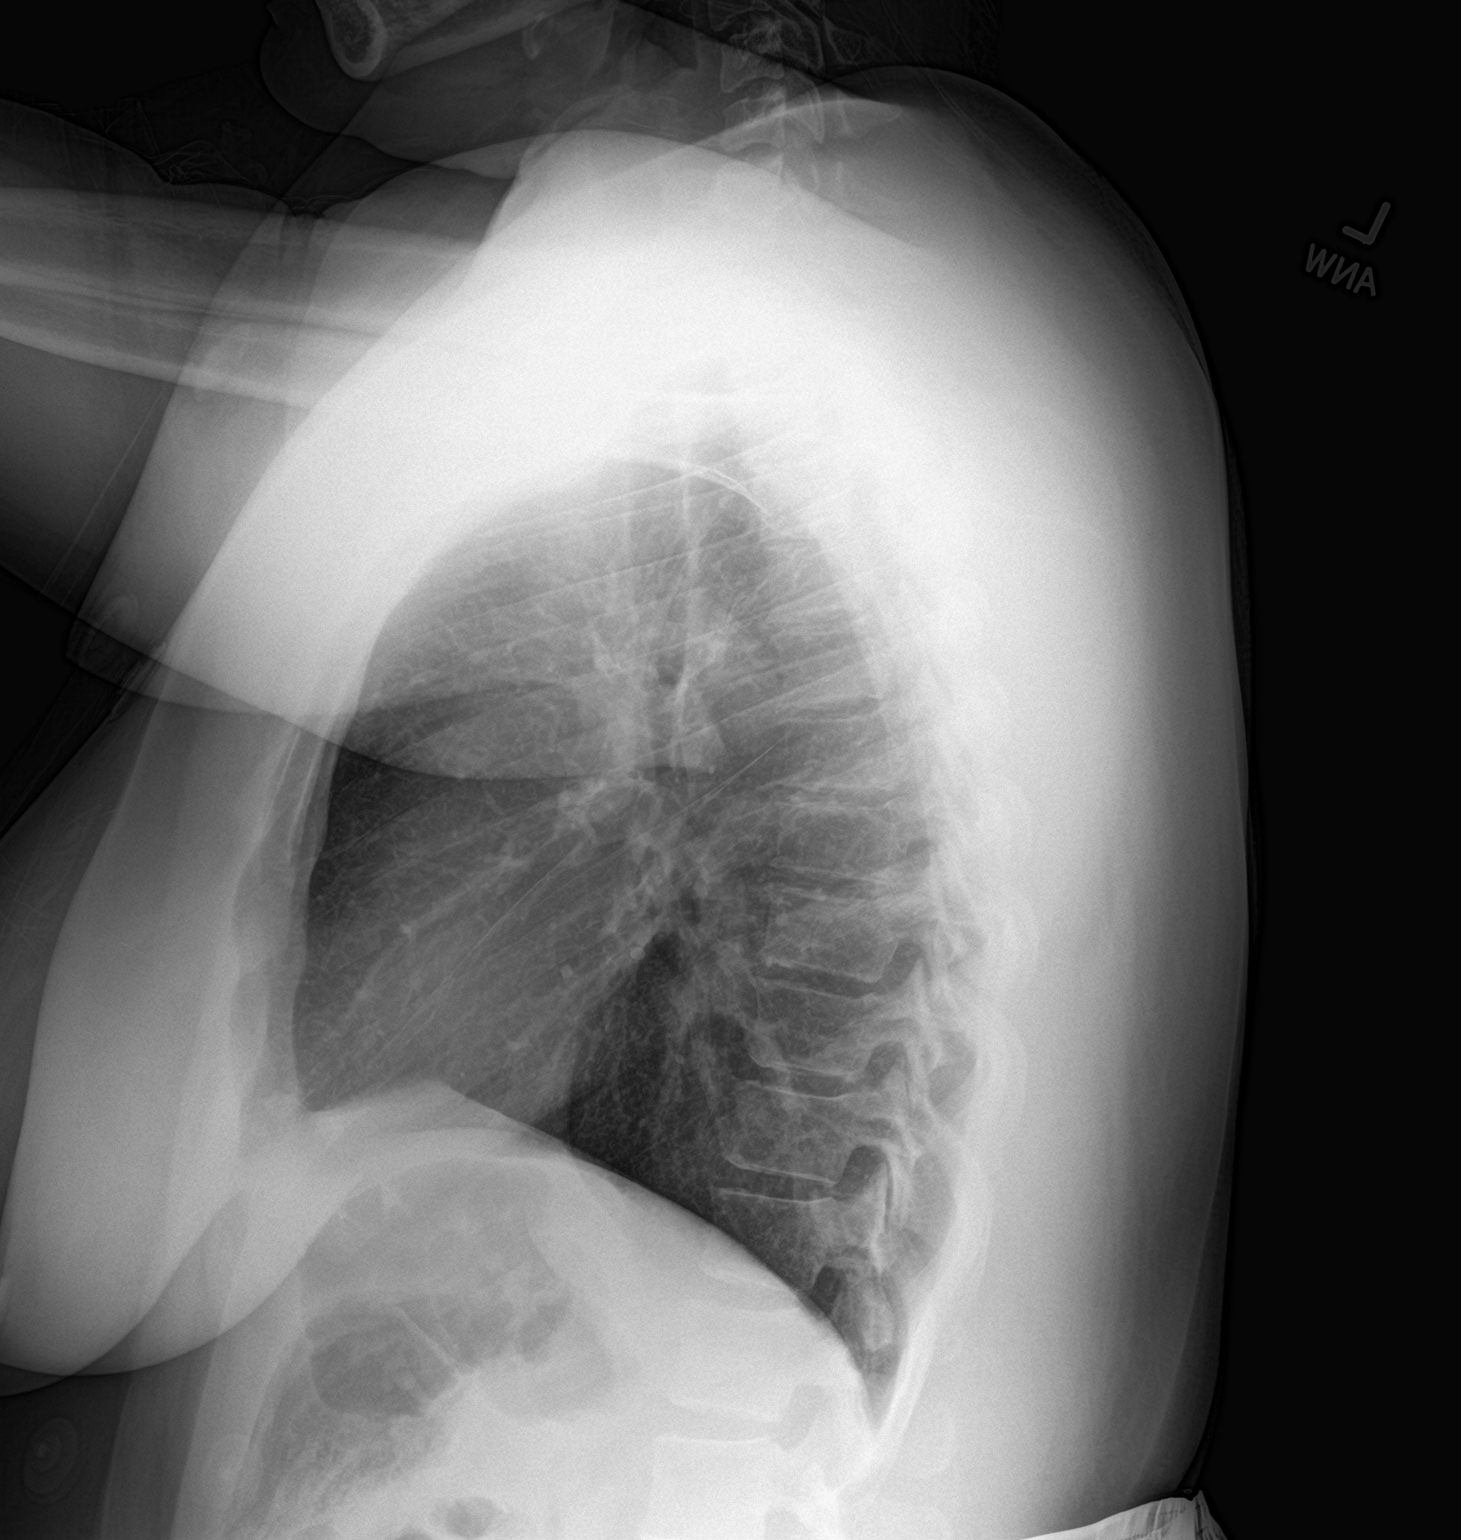

[2 of 2 positions shown; findings below may reference images not displayed]

FINDINGS: The heart size and mediastinal contours are within normal limits.
Both lungs are clear. The visualized skeletal structures are
unremarkable.
IMPRESSION: No active cardiopulmonary disease.

## 2024-01-28 ENCOUNTER — Encounter (HOSPITAL_COMMUNITY): Payer: Self-pay

## 2024-01-28 ENCOUNTER — Observation Stay (HOSPITAL_COMMUNITY)
Admission: EM | Admit: 2024-01-28 | Discharge: 2024-01-29 | Disposition: A | Discharge status: Home / Self Care | Payer: MEDICAID | Attending: Internal Medicine | Admitting: Internal Medicine

## 2024-01-28 ENCOUNTER — Observation Stay (HOSPITAL_COMMUNITY): Payer: MEDICAID

## 2024-01-28 ENCOUNTER — Other Ambulatory Visit: Payer: Self-pay

## 2024-01-28 ENCOUNTER — Emergency Department (HOSPITAL_COMMUNITY): Payer: MEDICAID

## 2024-01-28 DIAGNOSIS — E162 Hypoglycemia, unspecified: Principal | ICD-10-CM

## 2024-01-28 DIAGNOSIS — Z87891 Personal history of nicotine dependence: Secondary | ICD-10-CM | POA: Insufficient documentation

## 2024-01-28 DIAGNOSIS — R102 Pelvic and perineal pain: Secondary | ICD-10-CM

## 2024-01-28 DIAGNOSIS — Z8659 Personal history of other mental and behavioral disorders: Secondary | ICD-10-CM | POA: Insufficient documentation

## 2024-01-28 DIAGNOSIS — D72829 Elevated white blood cell count, unspecified: Secondary | ICD-10-CM | POA: Diagnosis not present

## 2024-01-28 DIAGNOSIS — N926 Irregular menstruation, unspecified: Secondary | ICD-10-CM | POA: Diagnosis not present

## 2024-01-28 DIAGNOSIS — N739 Female pelvic inflammatory disease, unspecified: Secondary | ICD-10-CM | POA: Diagnosis not present

## 2024-01-28 DIAGNOSIS — N83201 Unspecified ovarian cyst, right side: Secondary | ICD-10-CM

## 2024-01-28 DIAGNOSIS — Z8742 Personal history of other diseases of the female genital tract: Secondary | ICD-10-CM | POA: Diagnosis not present

## 2024-01-28 DIAGNOSIS — E876 Hypokalemia: Secondary | ICD-10-CM | POA: Diagnosis not present

## 2024-01-28 DIAGNOSIS — K651 Peritoneal abscess: Secondary | ICD-10-CM | POA: Insufficient documentation

## 2024-01-28 DIAGNOSIS — Z79899 Other long term (current) drug therapy: Secondary | ICD-10-CM | POA: Insufficient documentation

## 2024-01-28 DIAGNOSIS — F259 Schizoaffective disorder, unspecified: Secondary | ICD-10-CM | POA: Insufficient documentation

## 2024-01-28 LAB — HIV ANTIBODY (ROUTINE TESTING W REFLEX): HIV Screen 4th Generation wRfx: NONREACTIVE

## 2024-01-28 LAB — BASIC METABOLIC PANEL WITH GFR
Anion gap: 6 (ref 5–15)
Anion gap: 6 (ref 5–15)
BUN: 11 mg/dL (ref 6–20)
BUN: 6 mg/dL (ref 6–20)
CO2: 22 mmol/L (ref 22–32)
CO2: 23 mmol/L (ref 22–32)
Calcium: 8.8 mg/dL — ABNORMAL LOW (ref 8.9–10.3)
Calcium: 8.7 mg/dL — ABNORMAL LOW (ref 8.9–10.3)
Chloride: 113 mmol/L — ABNORMAL HIGH (ref 98–111)
Chloride: 108 mmol/L (ref 98–111)
Creatinine, Ser: 0.81 mg/dL (ref 0.44–1.00)
Creatinine, Ser: 0.73 mg/dL (ref 0.44–1.00)
GFR, Estimated: >60 mL/min (ref >60)
GFR, Estimated: >60 mL/min (ref >60)
Glucose, Bld: 36 mg/dL — CL (ref 70–99)
Glucose, Bld: 85 mg/dL (ref 70–99)
Potassium: 3.3 mmol/L — ABNORMAL LOW (ref 3.5–5.1)
Potassium: 3.6 mmol/L (ref 3.5–5.1)
Sodium: 141 mmol/L (ref 135–145)
Sodium: 137 mmol/L (ref 135–145)

## 2024-01-28 LAB — CBC WITH DIFFERENTIAL/PLATELET
Abs Immature Granulocytes: 0.07 10*3/uL (ref 0.00–0.07)
Basophils Absolute: 0.1 10*3/uL (ref 0.0–0.1)
Basophils Relative: 1 %
Eosinophils Absolute: 0.2 10*3/uL (ref 0.0–0.5)
Eosinophils Relative: 1 %
HCT: 37.2 % (ref 36.0–46.0)
Hemoglobin: 12.5 g/dL (ref 12.0–15.0)
Immature Granulocytes: 0 %
Lymphocytes Relative: 23 %
Lymphs Abs: 4 10*3/uL (ref 0.7–4.0)
MCH: 31.8 pg (ref 26.0–34.0)
MCHC: 33.6 g/dL (ref 30.0–36.0)
MCV: 94.7 fL (ref 80.0–100.0)
Monocytes Absolute: 1.2 10*3/uL — ABNORMAL HIGH (ref 0.1–1.0)
Monocytes Relative: 7 %
Neutro Abs: 11.7 10*3/uL — ABNORMAL HIGH (ref 1.7–7.7)
Neutrophils Relative %: 68 %
Platelets: 287 10*3/uL (ref 150–400)
RBC: 3.93 MIL/uL (ref 3.87–5.11)
RDW: 13.9 % (ref 11.5–15.5)
WBC: 17.2 10*3/uL — ABNORMAL HIGH (ref 4.0–10.5)
nRBC: 0 % (ref 0.0–0.2)

## 2024-01-28 LAB — CBG MONITORING, ED
Glucose-Capillary: 104 mg/dL — ABNORMAL HIGH (ref 70–99)
Glucose-Capillary: 35 mg/dL — CL (ref 70–99)
Glucose-Capillary: 100 mg/dL — ABNORMAL HIGH (ref 70–99)
Glucose-Capillary: 25 mg/dL — CL (ref 70–99)
Glucose-Capillary: 79 mg/dL (ref 70–99)
Glucose-Capillary: 28 mg/dL — CL (ref 70–99)
Glucose-Capillary: 92 mg/dL (ref 70–99)

## 2024-01-28 LAB — WET PREP, GENITAL
Clue Cells Wet Prep HPF POC: NONE SEEN
Sperm: NONE SEEN
Trich, Wet Prep: NONE SEEN
WBC, Wet Prep HPF POC: <10 (ref <10)

## 2024-01-28 LAB — CBC
HCT: 37.6 % (ref 36.0–46.0)
Hemoglobin: 12.8 g/dL (ref 12.0–15.0)
MCH: 32.3 pg (ref 26.0–34.0)
MCHC: 34 g/dL (ref 30.0–36.0)
MCV: 94.9 fL (ref 80.0–100.0)
Platelets: 257 10*3/uL (ref 150–400)
RBC: 3.96 MIL/uL (ref 3.87–5.11)
RDW: 13.9 % (ref 11.5–15.5)
WBC: 12.1 10*3/uL — ABNORMAL HIGH (ref 4.0–10.5)
nRBC: 0 % (ref 0.0–0.2)

## 2024-01-28 LAB — HCG, QUANTITATIVE, PREGNANCY: hCG, Beta Chain, Quant, S: <1 m[IU]/mL (ref <5)

## 2024-01-28 LAB — GLUCOSE, CAPILLARY
Glucose-Capillary: 109 mg/dL — ABNORMAL HIGH (ref 70–99)
Glucose-Capillary: 112 mg/dL — ABNORMAL HIGH (ref 70–99)
Glucose-Capillary: 129 mg/dL — ABNORMAL HIGH (ref 70–99)
Glucose-Capillary: 141 mg/dL — ABNORMAL HIGH (ref 70–99)
Glucose-Capillary: 33 mg/dL — CL (ref 70–99)

## 2024-01-28 LAB — CREATININE, SERUM
Creatinine, Ser: 0.77 mg/dL (ref 0.44–1.00)
GFR, Estimated: >60 mL/min (ref >60)

## 2024-01-28 MED ORDER — FLUCONAZOLE 150 MG PO TABS
150.0000 mg | ORAL_TABLET | Freq: Once | ORAL | Status: AC
  Administered 2024-01-28: 150 mg via ORAL
  Filled 2024-01-28: qty 1

## 2024-01-28 MED ORDER — ONDANSETRON HCL 4 MG PO TABS: 4.0000 mg | ORAL_TABLET | Freq: Four times a day (QID) | ORAL | Status: DC | PRN

## 2024-01-28 MED ORDER — METRONIDAZOLE 500 MG PO TABS
500.0000 mg | ORAL_TABLET | Freq: Two times a day (BID) | ORAL | Status: DC
  Administered 2024-01-28 – 2024-01-29 (×3): 500 mg via ORAL
  Filled 2024-01-28 (×3): qty 1

## 2024-01-28 MED ORDER — DEXTROSE 10 % IV SOLN: Freq: Once | INTRAVENOUS | Status: AC

## 2024-01-28 MED ORDER — ONDANSETRON HCL 4 MG/2ML IJ SOLN: 4.0000 mg | Freq: Four times a day (QID) | INTRAMUSCULAR | Status: DC | PRN

## 2024-01-28 MED ORDER — DOXYCYCLINE HYCLATE 100 MG PO TABS
100.0000 mg | ORAL_TABLET | Freq: Two times a day (BID) | ORAL | Status: DC
  Administered 2024-01-28 – 2024-01-29 (×3): 100 mg via ORAL
  Filled 2024-01-28 (×3): qty 1

## 2024-01-28 MED ORDER — DEXTROSE 50 % IV SOLN: 1.0000 | Freq: Once | INTRAVENOUS | Status: AC

## 2024-01-28 MED ORDER — DEXTROSE 50 % IV SOLN
INTRAVENOUS | Status: AC
  Administered 2024-01-28: 50 mL via INTRAVENOUS
  Filled 2024-01-28: qty 50

## 2024-01-28 MED ORDER — ENOXAPARIN SODIUM 40 MG/0.4ML IJ SOSY
40.0000 mg | PREFILLED_SYRINGE | INTRAMUSCULAR | Status: DC
  Administered 2024-01-28: 40 mg via SUBCUTANEOUS
  Filled 2024-01-28: qty 0.4

## 2024-01-28 MED ORDER — SODIUM CHLORIDE 0.9 % IV SOLN
2.0000 g | INTRAVENOUS | Status: DC
  Administered 2024-01-28 – 2024-01-29 (×2): 2 g via INTRAVENOUS
  Filled 2024-01-28 (×2): qty 20

## 2024-01-28 MED ORDER — DEXTROSE 50 % IV SOLN
1.0000 | INTRAVENOUS | Status: DC | PRN
  Administered 2024-01-28: 50 mL via INTRAVENOUS
  Filled 2024-01-28: qty 50

## 2024-01-28 MED ORDER — DEXTROSE 10 % IV SOLN: INTRAVENOUS | Status: AC

## 2024-01-28 MED ORDER — HYDROMORPHONE HCL 1 MG/ML IJ SOLN
0.5000 mg | INTRAMUSCULAR | Status: DC | PRN
  Administered 2024-01-28 – 2024-01-29 (×10): 0.5 mg via INTRAVENOUS
  Filled 2024-01-28 (×10): qty 0.5
  Filled 2024-01-28: qty 1

## 2024-01-28 MED ORDER — IOHEXOL 350 MG/ML SOLN
75.0000 mL | Freq: Once | INTRAVENOUS | Status: AC | PRN
  Administered 2024-01-28: 75 mL via INTRAVENOUS

## 2024-01-28 MED ORDER — DEXTROSE 50 % IV SOLN
INTRAVENOUS | Status: AC
  Filled 2024-01-28: qty 50

## 2024-01-28 NOTE — ED Provider Notes (Signed)
 Pt seen by Dr. Carie Charity, please see his note. Case discussed with Dr Ada Acres regarding admission for pt's hypoglycemia.   Trish Furl, MD 01/28/24 [phone]

## 2024-01-28 NOTE — ED Notes (Signed)
 Pt diaphoretic, alert to voice but in and out of consciousness, slurring words.  CBG checked and is 25.  MD notified and 1amp D50 given and D10 drip started.

## 2024-01-28 NOTE — ED Notes (Addendum)
CBG 35 

## 2024-01-28 NOTE — ED Provider Notes (Signed)
 [place] EMERGENCY DEPARTMENT AT [facility] Provider Note   CSN: [id number] Arrival date & time: 01/28/24  0309     History  Chief Complaint  Patient presents with   Hypoglycemia    EDDITH Lucas is a 33 y.o. female.  Presents to the emergency department for evaluation of low blood sugar.  Patient reports that she was feeling weak, dizzy, nausea.  She does take insulin  for diabetes, does not regularly check her sugar.  EMS found her sugar to be 55.  Patient reports that she is approximately [redacted] weeks pregnant.  She is experiencing pelvic pain for the last 3 days similar to pain that she has had with ovarian cysts in the past.  No vaginal bleeding.       Home Medications Prior to Admission medications   Medication Sig Start Date End Date Taking? Authorizing Provider  mirtazapine  (REMERON ) 7.5 MG tablet Take 1 tablet (7.5 mg total) by mouth at bedtime. 03/31/22   Nooruddin, Saad, MD  ondansetron  (ZOFRAN ) 4 MG tablet Take 1 tablet (4 mg total) by mouth 2 (two) times daily as needed for nausea or vomiting. Patient taking differently: Take 4 mg by mouth as needed for nausea or vomiting. 04/02/22   Lawrance Presume, MD  potassium chloride  SA (KLOR-CON  M) 20 MEQ tablet Take 1 tablet (20 mEq total) by mouth daily. Patient not taking: Reported on 05/14/2023 04/02/22   Lawrance Presume, MD  QUEtiapine  (SEROQUEL ) 100 MG tablet Take 1 tablet (100 mg total) by mouth at bedtime. Patient taking differently: Take 200 mg by mouth at bedtime. 03/31/22   Nooruddin, Saad, MD  spironolactone  (ALDACTONE ) 25 MG tablet Take 0.5 tablets (12.5 mg total) by mouth 2 (two) times daily. Patient not taking: Reported on 05/14/2023 03/31/22   Nooruddin, Saad, MD  traMADol  (ULTRAM ) 50 MG tablet Take 1 tablet (50 mg total) by mouth 2 (two) times daily as needed. Patient not taking: Reported on 05/14/2023 04/02/22   Lawrance Presume, MD      Allergies    Fish allergy, Motrin [ibuprofen], Mushroom  extract complex (obsolete), Shellfish-derived products, and Tylenol  [acetaminophen ]    Review of Systems   Review of Systems  Physical Exam Updated Vital Signs BP 105/62   Pulse 60   Temp 98.3 F (36.8 C)   Resp 18   Ht 5\' 2"  (1.575 m)   Wt 78.5 kg   LMP 11/23/2023 (Exact Date)   SpO2 100%   Breastfeeding No   BMI 31.64 kg/m  Physical Exam Vitals and nursing note reviewed.  Constitutional:      General: She is not in acute distress.    Appearance: She is well-developed.  HENT:     Head: Normocephalic and atraumatic.     Mouth/Throat:     Mouth: Mucous membranes are moist.  Eyes:     General: Vision grossly intact. Gaze aligned appropriately.     Extraocular Movements: Extraocular movements intact.     Conjunctiva/sclera: Conjunctivae normal.  Cardiovascular:     Rate and Rhythm: Normal rate and regular rhythm.     Pulses: Normal pulses.     Heart sounds: Normal heart sounds, S1 normal and S2 normal. No murmur heard.    No friction rub. No gallop.  Pulmonary:     Effort: Pulmonary effort is normal. No respiratory distress.     Breath sounds: Normal breath sounds.  Abdominal:     General: Bowel sounds are normal.     Palpations: Abdomen  is soft.     Tenderness: There is no abdominal tenderness. There is no guarding or rebound.     Hernia: No hernia is present.  Musculoskeletal:        General: No swelling.     Cervical back: Full passive range of motion without pain, normal range of motion and neck supple. No spinous process tenderness or muscular tenderness. Normal range of motion.     Right lower leg: No edema.     Left lower leg: No edema.  Skin:    General: Skin is warm and dry.     Capillary Refill: Capillary refill takes less than 2 seconds.     Findings: No ecchymosis, erythema, rash or wound.  Neurological:     General: No focal deficit present.     Mental Status: She is alert and oriented to person, place, and time.     GCS: GCS eye subscore is 4. GCS  verbal subscore is 5. GCS motor subscore is 6.     Cranial Nerves: Cranial nerves 2-12 are intact.     Sensory: Sensation is intact.     Motor: Motor function is intact.     Coordination: Coordination is intact.  Psychiatric:        Attention and Perception: Attention normal.        Mood and Affect: Mood normal.        Speech: Speech normal.        Behavior: Behavior normal.     ED Results / Procedures / Treatments   Labs (all labs ordered are listed, but only abnormal results are displayed) Labs Reviewed  CBC WITH DIFFERENTIAL/PLATELET - Abnormal; Notable for the following components:      Result Value   WBC 17.2 (*)    Neutro Abs 11.7 (*)    Monocytes Absolute 1.2 (*)    All other components within normal limits  BASIC METABOLIC PANEL WITH GFR - Abnormal; Notable for the following components:   Potassium 3.3 (*)    Chloride 113 (*)    Glucose, Bld 36 (*)    Calcium 8.8 (*)    All other components within normal limits  CBG MONITORING, ED - Abnormal; Notable for the following components:   Glucose-Capillary 104 (*)    All other components within normal limits  CBG MONITORING, ED - Abnormal; Notable for the following components:   Glucose-Capillary 35 (*)    All other components within normal limits  CBG MONITORING, ED - Abnormal; Notable for the following components:   Glucose-Capillary 100 (*)    All other components within normal limits  CBG MONITORING, ED - Abnormal; Notable for the following components:   Glucose-Capillary 25 (*)    All other components within normal limits  CBG MONITORING, ED - Abnormal; Notable for the following components:   Glucose-Capillary 28 (*)    All other components within normal limits  HCG, QUANTITATIVE, PREGNANCY  URINALYSIS, ROUTINE W REFLEX MICROSCOPIC  CBG MONITORING, ED    EKG None  Radiology No results found.  Procedures Procedures    Medications Ordered in ED Medications  dextrose  50 % solution 50 mL (50 mLs  Intravenous Given 01/28/24 0432)  dextrose  50 % solution 50 mL (50 mLs Intravenous Given 01/28/24 0531)  dextrose  10 % infusion ( Intravenous Rate/Dose Change 01/28/24 0706)  iohexol  (OMNIPAQUE ) 350 MG/ML injection 75 mL (75 mLs Intravenous Contrast Given 01/28/24 0659)  dextrose  50 % solution 50 mL (50 mLs Intravenous Given 01/28/24 0700)  ED Course/ Medical Decision Making/ A&P                                 Medical Decision Making Amount and/or Complexity of Data Reviewed Labs: ordered. Radiology: ordered.  Risk Prescription drug management.   Presents to the emergency department for evaluation of low blood sugar.  Patient reports that she does have diabetes and then the last year was started on insulin .  Patient reports that she takes her insulin  as prescribed but does not check her blood sugars.  She comes in tonight with a blood sugar of 55.  Patient reports that she is [redacted] weeks pregnant.  She is experiencing diffuse pelvic pain and cramping.  She reports this feels like the pain that she has had in the past with ovarian cyst that required surgical intervention.  Patient's vital signs are unremarkable.  Blood sugar has dropped twice here in the ED, requiring IV dextrose .  She is currently on a D10 drip.  Patient's quantitative hCG was negative, less than 1.  She is not pregnant.  Patient reports abdominal distention in addition to her pelvic pain.  Does have a leukocytosis, no significant anemia.  Will perform CT scan to further evaluate complaints.        Final Clinical Impression(s) / ED Diagnoses Final diagnoses:  Hypoglycemia    Rx / DC Orders ED Discharge Orders     None         Lynee Rosenbach, Marine Sia, MD 01/28/24 [phone]

## 2024-01-28 NOTE — ED Notes (Signed)
CBG 54 

## 2024-01-28 NOTE — ED Triage Notes (Signed)
 Pt arrived from home via [facility] c/o hypoglycemia 55 upon EMS arrival. Pt states that she takes 70/30 insulin  30 units twice a day. Pt told EMS that she takes her insulin  twice a day with out checking her cbg. Pt states that she is [redacted] weeks pregnant. Pt states that she is a type 2 diabetic, has only been diabetic for a year and is learning .

## 2024-01-28 NOTE — H&P (Addendum)
 History and Physical    Paula Lucas:[id number] DOB: 08-03-1991 DOA: 01/28/2024  PCP: Lawrance Presume, MD  Patient coming from: home  I have personally briefly reviewed patient's old medical records in [facility]  Chief Complaint: pelvic pain, general malaise  HPI: Paula Lucas is Paula Lucas 33 y.o. 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  She notes feeling Paula Lucas fever about 3 days ago, in addition she noted abdominal pain.  Described as cramping sensation in her lower abdomen.  She notes spotting, but no vaginal discharge.  In addition, she notes feeling weak, dizzy, and lightheaded.  "I feel very weak".  She notes pain with urinating and having Deshan Hemmelgarn bowel movement "I feel like something's going to fall out".  No CP, no SOB.  She notes she's been on insulin  for Paula Lucas year and Paula Lucas half, notes insulin  recently increased in February.  No smoking or drinking.    ED Course: Labs, imaging, admit for hypoglycemia.   Review of Systems: As per HPI otherwise all other systems reviewed and are negative.  Past Medical History:  Diagnosis Date   Asthma    BMI 31.0-31.9,adult    Chlamydia    Gonorrhea    TOA (tubo-ovarian abscess)     Past Surgical History:  Procedure Laterality Date   INDUCED ABORTION     IR RADIOLOGIST EVAL & MGMT  05/17/2018   LAPAROTOMY N/Lateria Alderman 02/16/2022   Procedure: EXPLORATORY LAPAROTOMY WITH ABDOMINAL WASHOUT;  Surgeon: Granville Layer, MD;  Location[facility] OR;  Service: Gynecology;  Laterality: N/Lareina Espino;   SMALL BOWEL REPAIR N/Tyrel Lex 02/16/2022   Procedure: SMALL BOWEL REPAIR;  Surgeon: Dorena Gander, MD;  Location: [facility] OR;  Service: General;  Laterality: N/Alexandrya Chim;   TONSILLECTOMY      Social History  reports that she has quit smoking. Her smoking use included cigarettes. She has Paula Lucas 4.5 pack-year smoking history. She has never used smokeless  tobacco. She reports that she does not currently use drugs after having used the following drugs: Marijuana. She reports that she does not drink alcohol .  Allergies  Allergen Reactions   Fish Allergy Anaphylaxis    Severe allergic reaction to all seafoods   Motrin [Ibuprofen] Anaphylaxis    Pt tolerated Ketorolac  04/02/18 Pt tolerated toradol  during hosp admission in July 2023   Mushroom Extract Complex (Obsolete) Anaphylaxis   Shellfish-Derived Products Anaphylaxis   Tylenol  [Acetaminophen ] Anaphylaxis    Family History  Problem Relation Age of Onset   Diabetes Mother    Hypertension Mother    Diabetes Father    Hypertension Father    Diabetes Sister    Hypertension Sister    Prior to Admission medications   Medication Sig Start Date End Date Taking? Authorizing Provider  mirtazapine  (REMERON ) 7.5 MG tablet Take 1 tablet (7.5 mg total) by mouth at bedtime. 03/31/22   Nooruddin, Saad, MD  ondansetron  (ZOFRAN ) 4 MG tablet Take 1 tablet (4 mg total) by mouth 2 (two) times daily as needed for nausea or vomiting. Patient taking differently: Take 4 mg by mouth as needed for nausea or vomiting. 04/02/22   Lawrance Presume, MD  potassium chloride  SA (KLOR-CON  M) 20 MEQ tablet Take 1 tablet (20 mEq total) by mouth daily. Patient not taking: Reported on 05/14/2023 04/02/22   Lawrance Presume, MD  QUEtiapine  (SEROQUEL ) 100 MG tablet Take 1 tablet (100 mg total) by mouth  at bedtime. Patient taking differently: Take 200 mg by mouth at bedtime. 03/31/22   Nooruddin, Saad, MD  spironolactone  (ALDACTONE ) 25 MG tablet Take 0.5 tablets (12.5 mg total) by mouth 2 (two) times daily. Patient not taking: Reported on 05/14/2023 03/31/22   Nooruddin, Saad, MD  traMADol  (ULTRAM ) 50 MG tablet Take 1 tablet (50 mg total) by mouth 2 (two) times daily as needed. Patient not taking: Reported on 05/14/2023 04/02/22   Lawrance Presume, MD    Physical Exam: Vitals:   01/28/24 0500 01/28/24 0530 01/28/24 0600  01/28/24 0700  BP: (!) 100/58 (!) 112/55 (!) 91/53 105/62  Pulse: 66 63 64 60  Resp:    18  Temp:    98.3 F (36.8 C)  TempSrc:      SpO2: 100% 100% 100% 100%  Weight:      Height:        Constitutional: NAD, appears uncomfortable with pain and hunger Vitals:   01/28/24 0500 01/28/24 0530 01/28/24 0600 01/28/24 0700  BP: (!) 100/58 (!) 112/55 (!) 91/53 105/62  Pulse: 66 63 64 60  Resp:    18  Temp:    98.3 F (36.8 C)  TempSrc:      SpO2: 100% 100% 100% 100%  Weight:      Height:       Eyes: PERRL ENMT: Mucous membranes are moist. Neck: normal, supple Respiratory: unlabored, CTAB Cardiovascular: RRR  Abdomen: suprapubic TTP  Musculoskeletal: no clubbing / cyanosis. No joint deformity upper and lower extremities.  Skin: no rashes, lesions, ulcers. No induration Neurologic: CN 2-12 grossly intact. Moving all extremities. Psychiatric: Normal judgment and insight. Alert and oriented x 3. Normal mood.   Labs on Admission: I have personally reviewed following labs and imaging studies  CBC: Recent Labs  Lab 01/28/24 0413  WBC 17.2*  NEUTROABS 11.7*  HGB 12.5  HCT 37.2  MCV 94.7  PLT 287    Basic Metabolic Panel: Recent Labs  Lab 01/28/24 0413  NA 141  K 3.3*  CL 113*  CO2 22  GLUCOSE 36*  BUN 11  CREATININE 0.81  CALCIUM 8.8*    GFR: Estimated Creatinine Clearance: 97.7 mL/min (by C-G formula based on SCr of 0.81 mg/dL).  Liver Function Tests: No results for input(s): "AST", "ALT", "ALKPHOS", "BILITOT", "PROT", "ALBUMIN " in the last 168 hours.  Urine analysis:    Component Value Date/Time   COLORURINE YELLOW 05/14/2023 0457   APPEARANCEUR CLOUDY (Latecia Miler) 05/14/2023 0457   LABSPEC 1.010 05/14/2023 0457   PHURINE 5.5 05/14/2023 0457   GLUCOSEU NEGATIVE 05/14/2023 0457   HGBUR TRACE (Alexsus Papadopoulos) 05/14/2023 0457   BILIRUBINUR NEGATIVE 05/14/2023 0457   BILIRUBINUR negative 04/13/2018 1521   KETONESUR NEGATIVE 05/14/2023 0457   PROTEINUR NEGATIVE 05/14/2023  0457   UROBILINOGEN 0.2 05/23/2019 1646   NITRITE NEGATIVE 05/14/2023 0457   LEUKOCYTESUR NEGATIVE 05/14/2023 0457    Radiological Exams on Admission: CT ABDOMEN PELVIS W CONTRAST Result Date: 01/28/2024 CLINICAL DATA:  33 year old female with abdominal pain. EXAM: CT ABDOMEN AND PELVIS WITH CONTRAST TECHNIQUE: Multidetector CT imaging of the abdomen and pelvis was performed using the standard protocol following bolus administration of intravenous contrast. RADIATION DOSE REDUCTION: This exam was performed according to the departmental dose-optimization program which includes automated exposure control, adjustment of the mA and/or kV according to patient size and/or use of iterative reconstruction technique. CONTRAST:  75mL OMNIPAQUE  IOHEXOL  350 MG/ML SOLN COMPARISON:  CT Abdomen and Pelvis, pelvis ultrasound 05/14/2023 and earlier. FINDINGS: Lower chest:  Similar asymmetric costophrenic angle atelectasis and/or scarring, left greater than right. No pericardial or pleural effusion. Normal heart size. Hepatobiliary: Mild motion artifact, negative liver and gallbladder. Pancreas: Negative. Spleen: Negative. Adrenals/Urinary Tract: Mild motion artifact. Adrenal glands and kidneys appears stable and within normal limits. No nephrolithiasis. No hydronephrosis or hydroureter. Unremarkable bladder. Stomach/Bowel: Gas-filled rectum. Nondilated upstream large bowel with retained stool most pronounced in the transverse colon. Normal appendix medial to the cecum on series 3, image 50 and no large bowel inflammation. No dilated small bowel. Decompressed stomach and duodenum. No pneumoperitoneum, free fluid, mesenteric inflammation identified. Small chronic left para umbilical fat containing hernia appears stable. No herniated bowel. Vascular/Lymphatic: Portal venous phase dominant contrast timing. Portal venous structures, may return ir arterial structures in the abdomen and pelvis appear patent and within normal limits.  No lymphadenopathy. Reproductive: Simple appearing ovarian cysts, simple fluid density measuring 3.9 cm to the left of midline and 4.5 cm to the right (no follow-up imaging recommended). Superimposed heterogeneously enhancing roughly 4 cm right uterine fibroid, series 6, image 49, also demonstrated on previous ultrasound. Other: No pelvis free fluid. Musculoskeletal: Negative. IMPRESSION: 1. No acute or inflammatory process identified in the abdomen. Normal appendix. 2. Chronic uterine fibroid, and ovarian cysts which do Not meet size criteria for follow-up. Electronically Signed   By: Marlise Simpers M.D.   On: 01/28/2024 07:28    EKG: Independently reviewed. pending  Assessment/Plan Principal Problem:   Hypoglycemia    Assessment and Plan:  Hypoglycemia In the setting of taking 30 units 70/30 insulin  BID (*last dose 30 minutes before arriving to hospital) I see no evidence of diabetes going back to 01/2022.  Unclear when she was started on insulin  and why.  Not clear how accurate Bhavya Eschete history she gives regarding this, but she tells me insulin  was increased in February (though in 04/2023 d/c summary her she's noted to be on insulin  70/30 30 units BID).   Should discontinue insulin . Will repeat A1c Continue d10, prn d50 Would be worth touching base with [facility] during work week to get some additional history for this.  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  LMP 4/2-3.  hCG here is negative.      DVT prophylaxis: lovenox   Code Status:    full  Family Communication:  none  Disposition Plan:   Patient is from:  home  Anticipated DC to:  home  Anticipated DC date:  Pending improvement  Anticipated DC barriers: Improvement in hypoglycemia, improvement/workup of abdominal pain  Consults called:  none  Admission status:  obs   Severity of Illness: The appropriate patient status for this patient is OBSERVATION. Observation status is judged to be reasonable and necessary in order to provide the required intensity of service to ensure the patient's safety. The patient's presenting symptoms, physical exam findings, and initial radiographic and laboratory data in the context of their medical condition is felt to place them at decreased risk for further clinical deterioration. Furthermore, it is anticipated that the patient will be  medically stable for discharge from the hospital within 2 midnights of admission.     Donnetta Gains MD [facility]  How to contact the [facility] Attending or Consulting provider 7A - 7P or covering provider during after hours 7P -7A, for this patient?   Check the care team in [facility] and look for Aliviyah Malanga) attending/consulting [facility] provider listed and b) the [facility] team listed Log into [email or web address] and use [place]'s universal password to access. If you do not have the password, please contact the hospital operator. Locate the [facility] provider you are looking for under [facility] and page to Janeva Peaster number that you can be directly reached. If you still have difficulty reaching the provider, please page the [facility] (Director on Call) for the Hospitalists listed on amion for assistance.  01/28/2024, 8:59 AM

## 2024-01-28 NOTE — Consult Note (Addendum)
 OBSTETRICS AND GYNECOLOGY ATTENDING CONSULT NOTE  Consult Date: 01/28/2024  Reason for Consult: pelvic pain, h/o PID Consulting Provider: Dr. Otisha Lucas    Assessment/Plan: 1) Pelvic pain- working diagnosis ovarian cyst vs PID -IV Rocephin , oral Doxy and Flagyl  -dilaudid  as needed for pain though would try to transition to oral medication -pelvic US - completed confirm hemorrhagic cyst and benign findings -CT findings suggestive of benign ovarian cyst -GC/C, wet prep pending -while pt does have pelvic pain, currently afebrile with mild leukocytosis.  Due to significant PID in the past will assume PID and treat as above; however, pain may likely be due to simple cyst -Plan to trend CBC and clinic picture  ADDENDUM: +yeast- oral Fluconazole  ordered  2) Constipation -management per primary team  3) Dysuria UA and culture to be collected  Appreciate care of Paula Lucas by her primary team Will continue daily rounding. Daily FHR assessment will be done by our Rapid Response OB Nurse as directed.   Please call [phone] [facility] OB/[facility]N Consult Attending Monday-Friday 8am - 5pm) or [phone] [facility] Attending On Call all day, every day) for any gynecologic concerns at any time.  Thank you for involving us  in the care of this patient.  Total consultation time including face-to-face time with patient (>50% of time), reviewing chart and documentation: 30 minutes  Paula Dolores, DO Attending Obstetrician & Gynecologist, [facility] for [facility], [facility]   History of Present Illness: Paula Lucas is an 33 y.o. [id number] female who was admitted for pelvic pain.  She notes the pain started about 3 days ago she reports nausea and feeling hot.   State the pain is constant lower abdomen- she does not take anything for pain.  Of note her menses are typically every month and regular; however, missed menses in April.  She does have some light pink  spotting currently.  Desires pregnancy and actively trying for the past few months.  HCG negative. Denies vaginal discharge or itching.  She does note a vaginal odor that has been there she notes since her last hospital admission.  She has been sexually active with her husband but is concerned about infidelity  +Dysuria, +Constipation- no BM x 3 days, typically 1-2x per day Notes some SOB with exertion due to the pain.  Denies fever, +chills.  No vomiting.  ROS otherwise negative except for stated above.  Pt with h/o severe PID requiring prolonged hospital stay, IV antibiotics and ultimately pelvic wash out on 02/16/2022.   Pt has been admitted for similar issue 04/2023- noted to have 2 right ovarian cysts at that time.  Pt was on the phone intermittently with partner requesting that he be here.  Pertinent OB/GYN History: Patient's last menstrual period was 11/23/2023 (exact date). OB History  Gravida Para Term Preterm AB Living  3 0 0 0 1 0  SAB IAB Ectopic Multiple Live Births  0 1 0 0 0    # Outcome Date GA Lbr Len/2nd Weight Sex Type Anes PTL Lv  3 Current           2 Gravida           1 IAB              Patient Active Problem List   Diagnosis Date Noted   Hypoglycemia 01/28/2024   Pain in pelvis 05/14/2023   Type 2 diabetes mellitus without complication, with long-term current use of insulin  (HCC) 05/14/2023   Suprapubic  abscess 05/14/2023   Schizoaffective disorder, bipolar type (HCC)    Abdominal pain, left upper quadrant    Fever 03/14/2022   COVID-19 03/13/2022   Abdominal pain 03/13/2022   Abscess of abdominal cavity (HCC)    Hypokalemia 03/05/2022   Pleural effusion 03/05/2022   Acute blood loss anemia 03/05/2022   Debility 02/28/2022   Depression 02/23/2022   Protein-calorie malnutrition (HCC)    Hx of Fitz-Hugh-Curtis syndrome    Hepatic abscess 02/15/2022   MRSA (methicillin resistant staph aureus) culture positive    Pelvic abscess      Past Medical  History:  Diagnosis Date   Asthma    BMI 31.0-31.9,adult    Chlamydia    Gonorrhea    TOA (tubo-ovarian abscess)     Past Surgical History:  Procedure Laterality Date   INDUCED ABORTION     IR RADIOLOGIST EVAL & MGMT  05/17/2018   LAPAROTOMY N/A 02/16/2022   Procedure: EXPLORATORY LAPAROTOMY WITH ABDOMINAL WASHOUT;  Surgeon: Paula Layer, MD;  Location: [facility] OR;  Service: Gynecology;  Laterality: N/A;   SMALL BOWEL REPAIR N/A 02/16/2022   Procedure: SMALL BOWEL REPAIR;  Surgeon: Paula Gander, MD;  Location: [facility] OR;  Service: General;  Laterality: N/A;   TONSILLECTOMY      Family History  Problem Relation Age of Onset   Diabetes Mother    Hypertension Mother    Diabetes Father    Hypertension Father    Diabetes Sister    Hypertension Sister     Social History:  reports that she has quit smoking. Her smoking use included cigarettes. She has a 4.5 pack-year smoking history. She has never used smokeless tobacco. She reports that she does not currently use drugs after having used the following drugs: Marijuana. She reports that she does not drink alcohol .  Allergies:  Allergies  Allergen Reactions   Fish Allergy Anaphylaxis    Severe allergic reaction to all seafoods   Motrin [Ibuprofen] Anaphylaxis    Pt tolerated Ketorolac  04/02/18 Pt tolerated toradol  during hosp admission in July 2023   Mushroom Extract Complex (Obsolete) Anaphylaxis   Shellfish-Derived Products Anaphylaxis   Tylenol  [Acetaminophen ] Anaphylaxis    Medications: I have reviewed the patient's current medications.  Review of Systems: Pertinent items noted in HPI and remainder of comprehensive ROS otherwise negative.  Focused Physical Examination: BP (!) 106/30 (BP Location: Left Arm)   Pulse (!) 44   Temp 98.3 F (36.8 C)   Resp 18   Ht 5\' 2"  (1.575 m)   Wt 78.5 kg   LMP 11/23/2023 (Exact Date)   SpO2 100%   Breastfeeding No   BMI 31.64 kg/m  CONSTITUTIONAL: Well-developed, well-nourished  female in no acute distress.  HENT:  Normocephalic, atraumatic, External right and left ear normal. Oropharynx is clear and moist EYES: Conjunctivae and EOM are normal. Pupils are equal, round, and reactive to light. No scleral icterus.  NECK: Normal range of motion, supple, no masses.  Normal thyroid.  SKIN: Skin is warm and dry. No rash noted. Not diaphoretic. No erythema. No pallor. NEUROLGIC: Alert and oriented to person, place, and time.  PSYCHIATRIC: Normal mood and affect. Normal behavior. CARDIOVASCULAR: Normal heart rate noted, regular rhythm RESPIRATORY: Clear to auscultation bilaterally. Effort and breath sounds normal, no problems with respiration noted. ABDOMEN: Soft, normal bowel sounds, no distention noted.  No rebound or guarding. Diffuse tenderness lower abdomen- pt noted mostly on her left side. PELVIC: Normal appearing external genitalia; normal appearing vaginal mucosa and  cervix.  No discharge noted.  Wet prep/GC/C obtained.   Normal uterine size, right-sided pelvic fullness noted with some discomfort on exam.  No cervical motion tenderness. Exam completed in the presence of a chaperone. MUSCULOSKELETAL: No calf tenderness bilaterally. EXT: no edema  Labs and Imaging: Results for orders placed or performed during the hospital encounter of 01/28/24 (from the past 72 hours)  CBG monitoring, ED     Status: Abnormal   Collection Time: 01/28/24  3:22 AM  Result Value Ref Range   Glucose-Capillary 104 (H) 70 - 99 mg/dL    Comment: Glucose reference range applies only to samples taken after fasting for at least 8 hours.  CBC with Differential/Platelet     Status: Abnormal   Collection Time: 01/28/24  4:13 AM  Result Value Ref Range   WBC 17.2 (H) 4.0 - 10.5 K/uL   RBC 3.93 3.87 - 5.11 MIL/uL   Hemoglobin 12.5 12.0 - 15.0 g/dL   HCT 91.4 78.2 - 95.6 %   MCV 94.7 80.0 - 100.0 fL   MCH 31.8 26.0 - 34.0 pg   MCHC 33.6 30.0 - 36.0 g/dL   RDW 21.3 08.6 - 57.8 %   Platelets  287 150 - 400 K/uL   nRBC 0.0 0.0 - 0.2 %   Neutrophils Relative % 68 %   Neutro Abs 11.7 (H) 1.7 - 7.7 K/uL   Lymphocytes Relative 23 %   Lymphs Abs 4.0 0.7 - 4.0 K/uL   Monocytes Relative 7 %   Monocytes Absolute 1.2 (H) 0.1 - 1.0 K/uL   Eosinophils Relative 1 %   Eosinophils Absolute 0.2 0.0 - 0.5 K/uL   Basophils Relative 1 %   Basophils Absolute 0.1 0.0 - 0.1 K/uL   Immature Granulocytes 0 %   Abs Immature Granulocytes 0.07 0.00 - 0.07 K/uL    Comment: Performed at [facility], [address], [place], [place] [address]  Basic metabolic panel with GFR     Status: Abnormal   Collection Time: 01/28/24  4:13 AM  Result Value Ref Range   Sodium 141 135 - 145 mmol/L   Potassium 3.3 (L) 3.5 - 5.1 mmol/L   Chloride 113 (H) 98 - 111 mmol/L   CO2 22 22 - 32 mmol/L   Glucose, Bld 36 (LL) 70 - 99 mg/dL    Comment: CRITICAL RESULT CALLED TO, READ BACK BY AND VERIFIED WITH JONES, S. RN @0533  01/28/24 SATRAINR Glucose reference range applies only to samples taken after fasting for at least 8 hours.    BUN 11 6 - 20 mg/dL   Creatinine, Ser 9.52 0.44 - 1.00 mg/dL   Calcium 8.8 (L) 8.9 - 10.3 mg/dL   GFR, Estimated >84 >13 mL/min    Comment: (NOTE) Calculated using the CKD-EPI Creatinine Equation (2021)    Anion gap 6 5 - 15    Comment: Performed at [facility], [address], [place], [place] [address]  hCG, quantitative, pregnancy     Status: None   Collection Time: 01/28/24  4:13 AM  Result Value Ref Range   hCG, Beta Chain, Quant, S <1 <5 mIU/mL    Comment:          GEST. AGE      CONC.  (mIU/mL)   <=1 WEEK        5 - 50     2 WEEKS       50 - 500     3 WEEKS  100 - 10,000     4 WEEKS     1,000 - 30,000     5 WEEKS     3,500 - 115,000   6-8 WEEKS     12,000 - 270,000    12 WEEKS     15,000 - 220,000        FEMALE AND NON-PREGNANT FEMALE:     LESS THAN 5 mIU/mL Performed at [facility], [address], [place], [place] [address]   CBG monitoring, ED      Status: Abnormal   Collection Time: 01/28/24  4:29 AM  Result Value Ref Range   Glucose-Capillary 35 (LL) 70 - 99 mg/dL    Comment: Glucose reference range applies only to samples taken after fasting for at least 8 hours.   Comment 1 Document in Chart   CBG monitoring, ED     Status: Abnormal   Collection Time: 01/28/24  4:58 AM  Result Value Ref Range   Glucose-Capillary 100 (H) 70 - 99 mg/dL    Comment: Glucose reference range applies only to samples taken after fasting for at least 8 hours.  CBG monitoring, ED     Status: Abnormal   Collection Time: 01/28/24  5:32 AM  Result Value Ref Range   Glucose-Capillary 25 (LL) 70 - 99 mg/dL    Comment: Glucose reference range applies only to samples taken after fasting for at least 8 hours.   Comment 1 Document in Chart   CBG monitoring, ED     Status: None   Collection Time: 01/28/24  6:22 AM  Result Value Ref Range   Glucose-Capillary 79 70 - 99 mg/dL    Comment: Glucose reference range applies only to samples taken after fasting for at least 8 hours.  CBG monitoring, ED     Status: Abnormal   Collection Time: 01/28/24  7:00 AM  Result Value Ref Range   Glucose-Capillary 28 (LL) 70 - 99 mg/dL    Comment: Glucose reference range applies only to samples taken after fasting for at least 8 hours.   Comment 1 Notify RN   CBG monitoring, ED     Status: None   Collection Time: 01/28/24  8:01 AM  Result Value Ref Range   Glucose-Capillary 92 70 - 99 mg/dL    Comment: Glucose reference range applies only to samples taken after fasting for at least 8 hours.  Glucose, capillary     Status: Abnormal   Collection Time: 01/28/24 10:09 AM  Result Value Ref Range   Glucose-Capillary 33 (LL) 70 - 99 mg/dL    Comment: Glucose reference range applies only to samples taken after fasting for at least 8 hours.  Glucose, capillary     Status: Abnormal   Collection Time: 01/28/24 11:01 AM  Result Value Ref Range   Glucose-Capillary 129 (H) 70 - 99  mg/dL    Comment: Glucose reference range applies only to samples taken after fasting for at least 8 hours.  CBC     Status: Abnormal   Collection Time: 01/28/24 11:37 AM  Result Value Ref Range   WBC 12.1 (H) 4.0 - 10.5 K/uL   RBC 3.96 3.87 - 5.11 MIL/uL   Hemoglobin 12.8 12.0 - 15.0 g/dL   HCT 60.4 54.0 - 98.1 %   MCV 94.9 80.0 - 100.0 fL   MCH 32.3 26.0 - 34.0 pg   MCHC 34.0 30.0 - 36.0 g/dL   RDW 19.1 47.8 - 29.5 %   Platelets  257 150 - 400 K/uL   nRBC 0.0 0.0 - 0.2 %    Comment: Performed at [facility], [address], [place], [place] [address]  Creatinine, serum     Status: None   Collection Time: 01/28/24 11:37 AM  Result Value Ref Range   Creatinine, Ser 0.77 0.44 - 1.00 mg/dL   GFR, Estimated >60 >45 mL/min    Comment: (NOTE) Calculated using the CKD-EPI Creatinine Equation (2021) Performed at [facility], [address] [place], [place] [address]   Wet prep, genital     Status: Abnormal   Collection Time: 01/28/24 12:25 PM   Specimen: Vaginal  Result Value Ref Range   Yeast Wet Prep HPF POC PRESENT (A) NONE SEEN   Trich, Wet Prep NONE SEEN NONE SEEN   Clue Cells Wet Prep HPF POC NONE SEEN NONE SEEN   WBC, Wet Prep HPF POC <10 <10   Sperm NONE SEEN     Comment: Performed at [facility], [address] [place], [place] [address]    CT ABDOMEN PELVIS W CONTRAST Result Date: 01/28/2024 CLINICAL DATA:  33 year old female with abdominal pain. EXAM: CT ABDOMEN AND PELVIS WITH CONTRAST TECHNIQUE: Multidetector CT imaging of the abdomen and pelvis was performed using the standard protocol following bolus administration of intravenous contrast. RADIATION DOSE REDUCTION: This exam was performed according to the departmental dose-optimization program which includes automated exposure control, adjustment of the mA and/or kV according to patient size and/or use of iterative reconstruction technique. CONTRAST:  75mL OMNIPAQUE  IOHEXOL  350 MG/ML SOLN  COMPARISON:  CT Abdomen and Pelvis, pelvis ultrasound 05/14/2023 and earlier. FINDINGS: Lower chest: Similar asymmetric costophrenic angle atelectasis and/or scarring, left greater than right. No pericardial or pleural effusion. Normal heart size. Hepatobiliary: Mild motion artifact, negative liver and gallbladder. Pancreas: Negative. Spleen: Negative. Adrenals/Urinary Tract: Mild motion artifact. Adrenal glands and kidneys appears stable and within normal limits. No nephrolithiasis. No hydronephrosis or hydroureter. Unremarkable bladder. Stomach/Bowel: Gas-filled rectum. Nondilated upstream large bowel with retained stool most pronounced in the transverse colon. Normal appendix medial to the cecum on series 3, image 50 and no large bowel inflammation. No dilated small bowel. Decompressed stomach and duodenum. No pneumoperitoneum, free fluid, mesenteric inflammation identified. Small chronic left para umbilical fat containing hernia appears stable. No herniated bowel. Vascular/Lymphatic: Portal venous phase dominant contrast timing. Portal venous structures, may return ir arterial structures in the abdomen and pelvis appear patent and within normal limits. No lymphadenopathy. Reproductive: Simple appearing ovarian cysts, simple fluid density measuring 3.9 cm to the left of midline and 4.5 cm to the right (no follow-up imaging recommended). Superimposed heterogeneously enhancing roughly 4 cm right uterine fibroid, series 6, image 49, also demonstrated on previous ultrasound. Other: No pelvis free fluid. Musculoskeletal: Negative. IMPRESSION: 1. No acute or inflammatory process identified in the abdomen. Normal appendix. 2. Chronic uterine fibroid, and ovarian cysts which do Not meet size criteria for follow-up. Electronically Signed   By: Marlise Simpers M.D.   On: 01/28/2024 07:28

## 2024-01-28 NOTE — ED Notes (Addendum)
 PT glucose was 54. Pt has had unstable sugars her whole stay in ED.  WE have been checking CBG's frequently.  By the time I got order for D50 and pulled it, the pt had moved from Tra A to 41 and then was taken up to the floor.  I called the floor to let them know that the pt went upstairs before I was able to give the D50.

## 2024-01-29 DIAGNOSIS — N83202 Unspecified ovarian cyst, left side: Secondary | ICD-10-CM

## 2024-01-29 DIAGNOSIS — R102 Pelvic and perineal pain: Secondary | ICD-10-CM | POA: Diagnosis not present

## 2024-01-29 DIAGNOSIS — N83201 Unspecified ovarian cyst, right side: Secondary | ICD-10-CM | POA: Diagnosis not present

## 2024-01-29 DIAGNOSIS — E162 Hypoglycemia, unspecified: Secondary | ICD-10-CM | POA: Diagnosis not present

## 2024-01-29 LAB — CBC
HCT: 33.7 % — ABNORMAL LOW (ref 36.0–46.0)
Hemoglobin: 11.6 g/dL — ABNORMAL LOW (ref 12.0–15.0)
MCH: 32.2 pg (ref 26.0–34.0)
MCHC: 34.4 g/dL (ref 30.0–36.0)
MCV: 93.6 fL (ref 80.0–100.0)
Platelets: 246 10*3/uL (ref 150–400)
RBC: 3.6 MIL/uL — ABNORMAL LOW (ref 3.87–5.11)
RDW: 14 % (ref 11.5–15.5)
WBC: 7.5 10*3/uL (ref 4.0–10.5)
nRBC: 0 % (ref 0.0–0.2)

## 2024-01-29 LAB — COMPREHENSIVE METABOLIC PANEL WITH GFR
ALT: 11 U/L (ref 0–44)
AST: 16 U/L (ref 15–41)
Albumin: 2.7 g/dL — ABNORMAL LOW (ref 3.5–5.0)
Alkaline Phosphatase: 31 U/L — ABNORMAL LOW (ref 38–126)
Anion gap: 8 (ref 5–15)
BUN: 5 mg/dL — ABNORMAL LOW (ref 6–20)
CO2: 23 mmol/L (ref 22–32)
Calcium: 8.3 mg/dL — ABNORMAL LOW (ref 8.9–10.3)
Chloride: 106 mmol/L (ref 98–111)
Creatinine, Ser: 0.69 mg/dL (ref 0.44–1.00)
GFR, Estimated: >60 mL/min (ref >60)
Glucose, Bld: 116 mg/dL — ABNORMAL HIGH (ref 70–99)
Potassium: 3.3 mmol/L — ABNORMAL LOW (ref 3.5–5.1)
Sodium: 137 mmol/L (ref 135–145)
Total Bilirubin: 0.6 mg/dL (ref 0.0–1.2)
Total Protein: 4.8 g/dL — ABNORMAL LOW (ref 6.5–8.1)

## 2024-01-29 LAB — GLUCOSE, CAPILLARY
Glucose-Capillary: 54 mg/dL — ABNORMAL LOW (ref 70–99)
Glucose-Capillary: 80 mg/dL (ref 70–99)
Glucose-Capillary: 125 mg/dL — ABNORMAL HIGH (ref 70–99)
Glucose-Capillary: 119 mg/dL — ABNORMAL HIGH (ref 70–99)
Glucose-Capillary: 76 mg/dL (ref 70–99)

## 2024-01-29 LAB — RPR: RPR Ser Ql: NONREACTIVE

## 2024-01-29 MED ORDER — POTASSIUM CHLORIDE CRYS ER 20 MEQ PO TBCR
40.0000 meq | EXTENDED_RELEASE_TABLET | Freq: Once | ORAL | Status: AC
  Administered 2024-01-29: 40 meq via ORAL
  Filled 2024-01-29: qty 2

## 2024-01-29 NOTE — Hospital Course (Signed)
 33yi with h/o intraabdominal/pelvic abscesses due to PID and schizoaffective d/o who presented on 6/7 with abdominal pain and hypoglycemia.  She does not appear to have diabetes; insulin  should be stopped.  Given empiric antibiotics for pelvic pain + Diflucan  for yeast.  GYN is consulting.

## 2024-01-29 NOTE — Discharge Summary (Signed)
 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  She does not appear to have diabetes; insulin  should be stopped.  Given empiric antibiotics for pelvic pain + Diflucan  for yeast.  GYN is consulting.  Assessment and Plan:  Hypoglycemia In the setting of taking 30 units 70/30 insulin  BID (last dose 30 minutes before arriving to hospital) I see no evidence of diabetes going back to 01/2022.  Unclear when she was started on insulin  and why.  Not clear how accurate a history she gives regarding this, but she tells me insulin  was increased in February (though in 04/2023 d/c summary her she's noted to be on insulin  70/30 30 units BID).   Should discontinue insulin  Repeat A1c is pending   Pelvic Pain  History of Intraabdominal and Pelvic Abscesses due to PID  CT without acute or inflammatory process in abdomen Pelvic US  with complex ovarian cyst, f/u US  in 3-6 months GYN consulting Wet prep with yeast, treated with fluconazole   Schizoaffective Disorder She not taking any psych meds at this time She denies any SI Screaming obscenities and demanding to be allowed to go outside Appears medically stable so will discharge to home      Consultants: GYN  Procedures: None  Antibiotics: Ceftriaxone  6/7-8 Doxycycline  6/7-8 Metronidazole  6/7-8 Diflucan  x 1      Pain control - [place]  Controlled Substance Reporting System database was reviewed. and patient was instructed, not to  drive, operate heavy machinery, perform activities at heights, swimming or participation in water activities or provide baby-sitting services while on Pain, Sleep and Anxiety Medications; until their outpatient Physician has advised to do so again. Also recommended to not to take more than prescribed Pain, Sleep and Anxiety Medications.   Disposition: Home Diet recommendation:  Regular diet DISCHARGE MEDICATION: Allergies as of 01/29/2024       Reactions   Fish Allergy Anaphylaxis   Severe allergic reaction to all seafoods   Motrin [ibuprofen] Anaphylaxis   Pt tolerated Ketorolac  04/02/18 Pt tolerated toradol  during hosp admission in July 2023   Mushroom Extract Complex (obsolete) Anaphylaxis   Shellfish-derived Products Anaphylaxis   Tylenol  [acetaminophen ] Anaphylaxis        Medication List     STOP taking these medications    insulin  NPH-regular Human (70-30) 100 UNIT/ML injection        Discharge Exam:    Subjective: Screaming up and down the halls, demanding to be able to leave the floor.  Appears very comfortable other than her irritability.   Objective: Vitals:   01/29/24 0454 01/29/24 0822  BP: 103/71 109/71  Pulse: 75 84  Resp: 17 17  Temp: 98.4 F (36.9 C) 98.1 F (36.7 C)  SpO2: 98% 100%    Intake/Output Summary (Last 24 hours) at 01/29/2024 1202 Last data filed at 01/29/2024 0600 Gross per 24 hour  Intake 1235.82 ml  Output 600 ml  Net 635.82 ml   Filed Weights   01/28/24 0319  Weight: 78.5 kg  Exam:  General:  Appears calm and comfortable and is in NAD Eyes:  normal lids, iris ENT:  grossly normal hearing, lips & tongue, mmm Cardiovascular:  RRR, no m/r/g. No LE edema.  Respiratory:   CTA bilaterally with no wheezes/rales/rhonchi.  Normal respiratory effort. Abdomen:  soft, mildly diffusely tender, distended Skin:  no rash or induration seen on limited exam Musculoskeletal:  grossly normal tone BUE/BLE, good ROM, no bony  abnormality Psychiatric:  labile mood and affect, speech fluent and appropriate, AOx3 Neurologic:  CN 2-12 grossly intact, moves all extremities in coordinated fashion  Data Reviewed: I have reviewed the patient's lab results since admission.  Pertinent labs for today include:  K+ 3.3, repleted Glucose 116 Albumin  2.7 WBC 7.5 Hgb 11.6 Wet prep with yeast A1c pending GC/Chl pending    Condition at discharge: stable  The results of significant diagnostics from this hospitalization (including imaging, microbiology, ancillary and laboratory) are listed below for reference.   Imaging Studies: US  PELVIS (TRANSABDOMINAL ONLY) Result Date: 01/28/2024 CLINICAL DATA:  33 year old female with pelvic pain. EXAM: TRANSABDOMINAL ULTRASOUND OF PELVIS DOPPLER ULTRASOUND OF OVARIES TECHNIQUE: Transabdominal ultrasound examination of the pelvis was performed including evaluation of the uterus, ovaries, adnexal regions, and pelvic cul-de-sac. Color and duplex Doppler ultrasound was utilized to evaluate blood flow to the ovaries. COMPARISON:  CT from earlier today. Pelvic sonogram from 9/21 un/25. FINDINGS: Uterus Measurements: 8.0 x 4.2 x 7.6 cm = volume: 134.1 mL. Again seen is a fibroid within the right posterior uterine fundus measuring 3.9 x 3.0 x 3.6 cm. Endometrium Thickness: 9.4 mm.  No focal abnormality visualized. Right ovary Measurements: 7.3 x 4.6 x 4.6 cm = volume: 74.0 mL. Complex cyst measures 5.5 x 4.1 x 3.9 cm. This contains heterogeneous material without internal blood flow and is favored to represent a hemorrhagic cyst with retracting clot. Left ovary Measurements: 7.0 x 3.8 x 4.3 cm = volume: 58.6 mL. Complex cyst containing diffuse low-level internal echoes with lace-like architecture compatible with a hemorrhagic cyst. This measures 4.6 x 3.6 x 3.4 cm Pulsed Doppler evaluation demonstrates normal low-resistance arterial and venous waveforms in both ovaries. Other: No free fluid. IMPRESSION:  1. No evidence for ovarian torsion. 2. Bilateral complex ovarian cyst. The left ovarian cyst has diffuse low-level internal echoes with lace-like architecture typical for a benign hemorrhagic cyst. The right ovary cyst is more complicated with heterogeneous internal echoes which is favored to represent retracting clot. Recommend follow-up US  in 3-6 months. Note: This recommendation does not apply to premenarchal patients or to those with increased risk (genetic, family history, elevated tumor markers or other high-risk factors) of ovarian cancer. Reference: Radiology 2019 Nov; 293(2):359-371. 3. Uterine fibroid. Electronically Signed   By: Kimberley Penman M.D.   On: 01/28/2024 13:49   US  PELVIC DOPPLER (TORSION R/O OR MASS ARTERIAL FLOW) Result Date: 01/28/2024 CLINICAL DATA:  33 year old female with pelvic pain. EXAM: TRANSABDOMINAL ULTRASOUND OF PELVIS DOPPLER ULTRASOUND OF OVARIES TECHNIQUE: Transabdominal ultrasound examination of the pelvis was performed including evaluation of the uterus, ovaries, adnexal regions, and pelvic cul-de-sac. Color and duplex Doppler ultrasound was utilized to evaluate blood flow to the ovaries. COMPARISON:  CT from earlier today. Pelvic sonogram from 9/21 un/25. FINDINGS: Uterus Measurements: 8.0 x 4.2 x 7.6 cm = volume: 134.1 mL. Again seen is a fibroid within the right posterior uterine fundus measuring 3.9 x 3.0 x 3.6 cm. Endometrium Thickness: 9.4 mm.  No focal abnormality visualized. Right ovary Measurements: 7.3 x 4.6 x 4.6  cm = volume: 74.0 mL. Complex cyst measures 5.5 x 4.1 x 3.9 cm. This contains heterogeneous material without internal blood flow and is favored to represent a hemorrhagic cyst with retracting clot. Left ovary Measurements: 7.0 x 3.8 x 4.3 cm = volume: 58.6 mL. Complex cyst containing diffuse low-level internal echoes with lace-like architecture compatible with a hemorrhagic cyst. This measures 4.6 x 3.6 x 3.4 cm Pulsed Doppler evaluation demonstrates  normal low-resistance arterial and venous waveforms in both ovaries. Other: No free fluid. IMPRESSION: 1. No evidence for ovarian torsion. 2. Bilateral complex ovarian cyst. The left ovarian cyst has diffuse low-level internal echoes with lace-like architecture typical for a benign hemorrhagic cyst. The right ovary cyst is more complicated with heterogeneous internal echoes which is favored to represent retracting clot. Recommend follow-up US  in 3-6 months. Note: This recommendation does not apply to premenarchal patients or to those with increased risk (genetic, family history, elevated tumor markers or other high-risk factors) of ovarian cancer. Reference: Radiology 2019 Nov; 293(2):359-371. 3. Uterine fibroid. Electronically Signed   By: Kimberley Penman M.D.   On: 01/28/2024 13:49   CT ABDOMEN PELVIS W CONTRAST Result Date: 01/28/2024 CLINICAL DATA:  33 year old female with abdominal pain. EXAM: CT ABDOMEN AND PELVIS WITH CONTRAST TECHNIQUE: Multidetector CT imaging of the abdomen and pelvis was performed using the standard protocol following bolus administration of intravenous contrast. RADIATION DOSE REDUCTION: This exam was performed according to the departmental dose-optimization program which includes automated exposure control, adjustment of the mA and/or kV according to patient size and/or use of iterative reconstruction technique. CONTRAST:  75mL OMNIPAQUE  IOHEXOL  350 MG/ML SOLN COMPARISON:  CT Abdomen and Pelvis, pelvis ultrasound 05/14/2023 and earlier. FINDINGS: Lower chest: Similar asymmetric costophrenic angle atelectasis and/or scarring, left greater than right. No pericardial or pleural effusion. Normal heart size. Hepatobiliary: Mild motion artifact, negative liver and gallbladder. Pancreas: Negative. Spleen: Negative. Adrenals/Urinary Tract: Mild motion artifact. Adrenal glands and kidneys appears stable and within normal limits. No nephrolithiasis. No hydronephrosis or hydroureter. Unremarkable  bladder. Stomach/Bowel: Gas-filled rectum. Nondilated upstream large bowel with retained stool most pronounced in the transverse colon. Normal appendix medial to the cecum on series 3, image 50 and no large bowel inflammation. No dilated small bowel. Decompressed stomach and duodenum. No pneumoperitoneum, free fluid, mesenteric inflammation identified. Small chronic left para umbilical fat containing hernia appears stable. No herniated bowel. Vascular/Lymphatic: Portal venous phase dominant contrast timing. Portal venous structures, may return ir arterial structures in the abdomen and pelvis appear patent and within normal limits. No lymphadenopathy. Reproductive: Simple appearing ovarian cysts, simple fluid density measuring 3.9 cm to the left of midline and 4.5 cm to the right (no follow-up imaging recommended). Superimposed heterogeneously enhancing roughly 4 cm right uterine fibroid, series 6, image 49, also demonstrated on previous ultrasound. Other: No pelvis free fluid. Musculoskeletal: Negative. IMPRESSION: 1. No acute or inflammatory process identified in the abdomen. Normal appendix. 2. Chronic uterine fibroid, and ovarian cysts which do Not meet size criteria for follow-up. Electronically Signed   By: Marlise Simpers M.D.   On: 01/28/2024 07:28    Microbiology: Results for orders placed or performed during the hospital encounter of 01/28/24  Wet prep, genital     Status: Abnormal   Collection Time: 01/28/24 12:25 PM   Specimen: Vaginal  Result Value Ref Range Status   Yeast Wet Prep HPF POC PRESENT (A) NONE SEEN Final   Trich, Wet Prep NONE SEEN NONE SEEN Final   Clue Cells Wet Prep  HPF POC NONE SEEN NONE SEEN Final   WBC, Wet Prep HPF POC <10 <10 Final   Sperm NONE SEEN  Final    Comment: Performed at [facility], [address] [place], [place] [address]    Labs: CBC: Recent Labs  Lab 01/28/24 0413 01/28/24 1137 01/29/24 0709  WBC 17.2* 12.1* 7.5  NEUTROABS 11.7*  --   --   HGB  12.5 12.8 11.6*  HCT 37.2 37.6 33.7*  MCV 94.7 94.9 93.6  PLT 287 257 246   Basic Metabolic Panel: Recent Labs  Lab 01/28/24 0413 01/28/24 1137 01/28/24 1551 01/29/24 0709  NA 141  --  137 137  K 3.3*  --  3.6 3.3*  CL 113*  --  108 106  CO2 22  --  23 23  GLUCOSE 36*  --  85 116*  BUN 11  --  6 5*  CREATININE 0.81 0.77 0.73 0.69  CALCIUM 8.8*  --  8.7* 8.3*   Liver Function Tests: Recent Labs  Lab 01/29/24 0709  AST 16  ALT 11  ALKPHOS 31*  BILITOT 0.6  PROT 4.8*  ALBUMIN  2.7*   CBG: Recent Labs  Lab 01/28/24 2013 01/28/24 2338 01/29/24 0450 01/29/24 0729 01/29/24 1129  GLUCAP 112* 141* 125* 119* 76    Discharge time spent: greater than 30 minutes.  Signed: Lorita Rosa, MD [facility] 01/29/2024

## 2024-01-29 NOTE — Plan of Care (Signed)
 Pt discharged home with significant other. Discharged instructions provided. IV removed.

## 2024-01-29 NOTE — Progress Notes (Signed)
 [facility] OB/GYN Attending Progress Note  ADMISSION DIAGNOSIS:   Principal Problem:   Hypoglycemia   Subjective  Pt feeling better this am, still having occasional pain on left side.  Denies fever/chills.  Voiding without difficulty.  No acute complaints this am and requesting to be discharged.  Pt's partner is here with her today and she is requesting she go outside now with her partner.     Objective  VITALS:  height is 5\' 2"  (1.575 m) and weight is 173 lb (78.5 kg). Her temperature is 98.1 F (36.7 C). Her blood pressure is 109/71 and her pulse is 82. Her respiration is 16 and oxygen saturation is 98%.   EXAMINATION: CONSTITUTIONAL: Well-developed, well-nourished female in no acute distress.  HENT:  Normocephalic, atraumatic, External right and left ear normal. Oropharynx is clear and moist SKIN: Skin is warm and dry. No rash noted. Not diaphoretic.  NEUROLOGIC: Alert and oriented to person, place, and time. No cranial nerve deficit noted. PSYCHIATRIC: Normal mood and affect.  CARDIOVASCULAR: Normal heart rate noted RESPIRATORY: Effort and breath sounds normal, no problems with respiration noted.  CTAB MUSCULOSKELETAL: No calf tenderness bilaterally ABDOMEN: Soft, LLQ tenderness noted- but improved from yesterday  Laboratory Reports: Results for orders placed or performed during the hospital encounter of 01/28/24 (from the past 72 hours)  CBG monitoring, ED     Status: Abnormal   Collection Time: 01/28/24  3:22 AM  Result Value Ref Range   Glucose-Capillary 104 (H) 70 - 99 mg/dL    Comment: Glucose reference range applies only to samples taken after fasting for at least 8 hours.  CBC with Differential/Platelet     Status: Abnormal   Collection Time: 01/28/24  4:13 AM  Result Value Ref Range   WBC 17.2 (H) 4.0 - 10.5 K/uL   RBC 3.93 3.87 - 5.11 MIL/uL   Hemoglobin 12.5 12.0 - 15.0 g/dL   HCT 62.6 94.8 - 54.6 %   MCV 94.7 80.0 - 100.0 fL    MCH 31.8 26.0 - 34.0 pg   MCHC 33.6 30.0 - 36.0 g/dL   RDW 27.0 35.0 - 09.3 %   Platelets 287 150 - 400 K/uL   nRBC 0.0 0.0 - 0.2 %   Neutrophils Relative % 68 %   Neutro Abs 11.7 (H) 1.7 - 7.7 K/uL   Lymphocytes Relative 23 %   Lymphs Abs 4.0 0.7 - 4.0 K/uL   Monocytes Relative 7 %   Monocytes Absolute 1.2 (H) 0.1 - 1.0 K/uL   Eosinophils Relative 1 %   Eosinophils Absolute 0.2 0.0 - 0.5 K/uL   Basophils Relative 1 %   Basophils Absolute 0.1 0.0 - 0.1 K/uL   Immature Granulocytes 0 %   Abs Immature Granulocytes 0.07 0.00 - 0.07 K/uL    Comment: Performed at [facility], [address] [place], [place] [address]  Basic metabolic panel with GFR     Status: Abnormal   Collection Time: 01/28/24  4:13 AM  Result Value Ref Range   Sodium 141 135 - 145 mmol/L   Potassium 3.3 (L) 3.5 - 5.1 mmol/L   Chloride 113 (H) 98 - 111 mmol/L   CO2 22 22 - 32 mmol/L   Glucose, Bld 36 (LL) 70 - 99 mg/dL    Comment: CRITICAL RESULT CALLED TO, READ BACK BY AND VERIFIED WITH Rochelle Chu, S. RN @0533  01/28/24 SATRAINR Glucose reference range applies only to  samples taken after fasting for at least 8 hours.    BUN 11 6 - 20 mg/dL   Creatinine, Ser 1.47 0.44 - 1.00 mg/dL   Calcium 8.8 (L) 8.9 - 10.3 mg/dL   GFR, Estimated >82 >95 mL/min    Comment: (NOTE) Calculated using the CKD-EPI Creatinine Equation (2021)    Anion gap 6 5 - 15    Comment: Performed at [facility], [address], [place], [place] [address]  hCG, quantitative, pregnancy     Status: None   Collection Time: 01/28/24  4:13 AM  Result Value Ref Range   hCG, Beta Chain, Quant, S <1 <5 mIU/mL    Comment:          GEST. AGE      CONC.  (mIU/mL)   <=1 WEEK        5 - 50     2 WEEKS       50 - 500     3 WEEKS       100 - 10,000     4 WEEKS     1,000 - 30,000     5 WEEKS     3,500 - 115,000   6-8 WEEKS     12,000 - 270,000    12 WEEKS     15,000 - 220,000        FEMALE AND NON-PREGNANT FEMALE:     LESS THAN 5  mIU/mL Performed at [facility], [address], [place], [place] [address]   CBG monitoring, ED     Status: Abnormal   Collection Time: 01/28/24  4:29 AM  Result Value Ref Range   Glucose-Capillary 35 (LL) 70 - 99 mg/dL    Comment: Glucose reference range applies only to samples taken after fasting for at least 8 hours.   Comment 1 Document in Chart   CBG monitoring, ED     Status: Abnormal   Collection Time: 01/28/24  4:58 AM  Result Value Ref Range   Glucose-Capillary 100 (H) 70 - 99 mg/dL    Comment: Glucose reference range applies only to samples taken after fasting for at least 8 hours.  CBG monitoring, ED     Status: Abnormal   Collection Time: 01/28/24  5:32 AM  Result Value Ref Range   Glucose-Capillary 25 (LL) 70 - 99 mg/dL    Comment: Glucose reference range applies only to samples taken after fasting for at least 8 hours.   Comment 1 Document in Chart   CBG monitoring, ED     Status: None   Collection Time: 01/28/24  6:22 AM  Result Value Ref Range   Glucose-Capillary 79 70 - 99 mg/dL    Comment: Glucose reference range applies only to samples taken after fasting for at least 8 hours.  CBG monitoring, ED     Status: Abnormal   Collection Time: 01/28/24  7:00 AM  Result Value Ref Range   Glucose-Capillary 28 (LL) 70 - 99 mg/dL    Comment: Glucose reference range applies only to samples taken after fasting for at least 8 hours.   Comment 1 Notify RN   CBG monitoring, ED     Status: None   Collection Time: 01/28/24  8:01 AM  Result Value Ref Range   Glucose-Capillary 92 70 - 99 mg/dL    Comment: Glucose reference range applies only to samples taken after fasting for at least 8 hours.  Glucose, capillary     Status: Abnormal  Collection Time: 01/28/24 10:09 AM  Result Value Ref Range   Glucose-Capillary 33 (LL) 70 - 99 mg/dL    Comment: Glucose reference range applies only to samples taken after fasting for at least 8 hours.  Glucose, capillary     Status:  Abnormal   Collection Time: 01/28/24 11:01 AM  Result Value Ref Range   Glucose-Capillary 129 (H) 70 - 99 mg/dL    Comment: Glucose reference range applies only to samples taken after fasting for at least 8 hours.  HIV Antibody (routine testing w rflx)     Status: None   Collection Time: 01/28/24 11:37 AM  Result Value Ref Range   HIV Screen 4th Generation wRfx Non Reactive Non Reactive    Comment: Performed at [facility], [address] [place], [place] [address]  RPR     Status: None   Collection Time: 01/28/24 11:37 AM  Result Value Ref Range   RPR Ser Ql NON REACTIVE NON REACTIVE    Comment: Performed at [facility], [address], [place], [place] [address]  CBC     Status: Abnormal   Collection Time: 01/28/24 11:37 AM  Result Value Ref Range   WBC 12.1 (H) 4.0 - 10.5 K/uL   RBC 3.96 3.87 - 5.11 MIL/uL   Hemoglobin 12.8 12.0 - 15.0 g/dL   HCT 09.8 11.9 - 14.7 %   MCV 94.9 80.0 - 100.0 fL   MCH 32.3 26.0 - 34.0 pg   MCHC 34.0 30.0 - 36.0 g/dL   RDW 82.9 56.2 - 13.0 %   Platelets 257 150 - 400 K/uL   nRBC 0.0 0.0 - 0.2 %    Comment: Performed at [facility], [address], [place], [place] [address]  Creatinine, serum     Status: None   Collection Time: 01/28/24 11:37 AM  Result Value Ref Range   Creatinine, Ser 0.77 0.44 - 1.00 mg/dL   GFR, Estimated >46 >96 mL/min    Comment: (NOTE) Calculated using the CKD-EPI Creatinine Equation (2021) Performed at [facility], [address], [place], [place] [address]   Wet prep, genital     Status: Abnormal   Collection Time: 01/28/24 12:25 PM   Specimen: Vaginal  Result Value Ref Range   Yeast Wet Prep HPF POC PRESENT (A) NONE SEEN   Trich, Wet Prep NONE SEEN NONE SEEN   Clue Cells Wet Prep HPF POC NONE SEEN NONE SEEN   WBC, Wet Prep HPF POC <10 <10   Sperm NONE SEEN     Comment: Performed at [facility], [address] [place], [place] [address]  Glucose, capillary     Status:  Abnormal   Collection Time: 01/28/24  3:32 PM  Result Value Ref Range   Glucose-Capillary 109 (H) 70 - 99 mg/dL    Comment: Glucose reference range applies only to samples taken after fasting for at least 8 hours.  Basic metabolic panel     Status: Abnormal   Collection Time: 01/28/24  3:51 PM  Result Value Ref Range   Sodium 137 135 - 145 mmol/L   Potassium 3.6 3.5 - 5.1 mmol/L   Chloride 108 98 - 111 mmol/L   CO2 23 22 - 32 mmol/L   Glucose, Bld 85 70 - 99 mg/dL    Comment: Glucose reference range applies only to samples taken after fasting for at least 8 hours.   BUN 6 6 - 20 mg/dL  Creatinine, Ser 0.73 0.44 - 1.00 mg/dL   Calcium 8.7 (L) 8.9 - 10.3 mg/dL   GFR, Estimated >40 >98 mL/min    Comment: (NOTE) Calculated using the CKD-EPI Creatinine Equation (2021)    Anion gap 6 5 - 15    Comment: Performed at [facility], [address], [place], [place] [address]  Glucose, capillary     Status: Abnormal   Collection Time: 01/28/24  8:13 PM  Result Value Ref Range   Glucose-Capillary 112 (H) 70 - 99 mg/dL    Comment: Glucose reference range applies only to samples taken after fasting for at least 8 hours.  Glucose, capillary     Status: Abnormal   Collection Time: 01/28/24 11:38 PM  Result Value Ref Range   Glucose-Capillary 141 (H) 70 - 99 mg/dL    Comment: Glucose reference range applies only to samples taken after fasting for at least 8 hours.  Glucose, capillary     Status: Abnormal   Collection Time: 01/29/24  4:50 AM  Result Value Ref Range   Glucose-Capillary 125 (H) 70 - 99 mg/dL    Comment: Glucose reference range applies only to samples taken after fasting for at least 8 hours.  Comprehensive metabolic panel     Status: Abnormal   Collection Time: 01/29/24  7:09 AM  Result Value Ref Range   Sodium 137 135 - 145 mmol/L   Potassium 3.3 (L) 3.5 - 5.1 mmol/L   Chloride 106 98 - 111 mmol/L   CO2 23 22 - 32 mmol/L   Glucose, Bld 116 (H) 70 - 99 mg/dL     Comment: Glucose reference range applies only to samples taken after fasting for at least 8 hours.   BUN 5 (L) 6 - 20 mg/dL   Creatinine, Ser 7.82 0.44 - 1.00 mg/dL   Calcium 8.3 (L) 8.9 - 10.3 mg/dL   Total Protein 4.8 (L) 6.5 - 8.1 g/dL   Albumin  2.7 (L) 3.5 - 5.0 g/dL   AST 16 15 - 41 U/L   ALT 11 0 - 44 U/L   Alkaline Phosphatase 31 (L) 38 - 126 U/L   Total Bilirubin 0.6 0.0 - 1.2 mg/dL   GFR, Estimated >95 >62 mL/min    Comment: (NOTE) Calculated using the CKD-EPI Creatinine Equation (2021)    Anion gap 8 5 - 15    Comment: Performed at [facility], [address] [place], [place] [address]  CBC     Status: Abnormal   Collection Time: 01/29/24  7:09 AM  Result Value Ref Range   WBC 7.5 4.0 - 10.5 K/uL   RBC 3.60 (L) 3.87 - 5.11 MIL/uL   Hemoglobin 11.6 (L) 12.0 - 15.0 g/dL   HCT 57.8 (L) 46.9 - 62.9 %   MCV 93.6 80.0 - 100.0 fL   MCH 32.2 26.0 - 34.0 pg   MCHC 34.4 30.0 - 36.0 g/dL   RDW 52.8 41.3 - 24.4 %   Platelets 246 150 - 400 K/uL   nRBC 0.0 0.0 - 0.2 %    Comment: Performed at [facility], [address], [place], [place] [address]  Glucose, capillary     Status: Abnormal   Collection Time: 01/29/24  7:29 AM  Result Value Ref Range   Glucose-Capillary 119 (H) 70 - 99 mg/dL    Comment: Glucose reference range applies only to samples taken after fasting for at least 8 hours.  Glucose, capillary  Status: None   Collection Time: 01/29/24 11:29 AM  Result Value Ref Range   Glucose-Capillary 76 70 - 99 mg/dL    Comment: Glucose reference range applies only to samples taken after fasting for at least 8 hours.   Radiology Reports: US  PELVIS (TRANSABDOMINAL ONLY) Result Date: 01/28/2024 CLINICAL DATA:  33 year old female with pelvic pain. EXAM: TRANSABDOMINAL ULTRASOUND OF PELVIS DOPPLER ULTRASOUND OF OVARIES TECHNIQUE: Transabdominal ultrasound examination of the pelvis was performed including evaluation of the uterus, ovaries, adnexal regions, and  pelvic cul-de-sac. Color and duplex Doppler ultrasound was utilized to evaluate blood flow to the ovaries. COMPARISON:  CT from earlier today. Pelvic sonogram from 9/21 un/25. FINDINGS: Uterus Measurements: 8.0 x 4.2 x 7.6 cm = volume: 134.1 mL. Again seen is a fibroid within the right posterior uterine fundus measuring 3.9 x 3.0 x 3.6 cm. Endometrium Thickness: 9.4 mm.  No focal abnormality visualized. Right ovary Measurements: 7.3 x 4.6 x 4.6 cm = volume: 74.0 mL. Complex cyst measures 5.5 x 4.1 x 3.9 cm. This contains heterogeneous material without internal blood flow and is favored to represent a hemorrhagic cyst with retracting clot. Left ovary Measurements: 7.0 x 3.8 x 4.3 cm = volume: 58.6 mL. Complex cyst containing diffuse low-level internal echoes with lace-like architecture compatible with a hemorrhagic cyst. This measures 4.6 x 3.6 x 3.4 cm Pulsed Doppler evaluation demonstrates normal low-resistance arterial and venous waveforms in both ovaries. Other: No free fluid. IMPRESSION: 1. No evidence for ovarian torsion. 2. Bilateral complex ovarian cyst. The left ovarian cyst has diffuse low-level internal echoes with lace-like architecture typical for a benign hemorrhagic cyst. The right ovary cyst is more complicated with heterogeneous internal echoes which is favored to represent retracting clot. Recommend follow-up US  in 3-6 months. Note: This recommendation does not apply to premenarchal patients or to those with increased risk (genetic, family history, elevated tumor markers or other high-risk factors) of ovarian cancer. Reference: Radiology 2019 Nov; 293(2):359-371. 3. Uterine fibroid. Electronically Signed   By: Kimberley Penman M.D.   On: 01/28/2024 13:49   US  PELVIC DOPPLER (TORSION R/O OR MASS ARTERIAL FLOW) Result Date: 01/28/2024 CLINICAL DATA:  33 year old female with pelvic pain. EXAM: TRANSABDOMINAL ULTRASOUND OF PELVIS DOPPLER ULTRASOUND OF OVARIES TECHNIQUE: Transabdominal ultrasound  examination of the pelvis was performed including evaluation of the uterus, ovaries, adnexal regions, and pelvic cul-de-sac. Color and duplex Doppler ultrasound was utilized to evaluate blood flow to the ovaries. COMPARISON:  CT from earlier today. Pelvic sonogram from 9/21 un/25. FINDINGS: Uterus Measurements: 8.0 x 4.2 x 7.6 cm = volume: 134.1 mL. Again seen is a fibroid within the right posterior uterine fundus measuring 3.9 x 3.0 x 3.6 cm. Endometrium Thickness: 9.4 mm.  No focal abnormality visualized. Right ovary Measurements: 7.3 x 4.6 x 4.6 cm = volume: 74.0 mL. Complex cyst measures 5.5 x 4.1 x 3.9 cm. This contains heterogeneous material without internal blood flow and is favored to represent a hemorrhagic cyst with retracting clot. Left ovary Measurements: 7.0 x 3.8 x 4.3 cm = volume: 58.6 mL. Complex cyst containing diffuse low-level internal echoes with lace-like architecture compatible with a hemorrhagic cyst. This measures 4.6 x 3.6 x 3.4 cm Pulsed Doppler evaluation demonstrates normal low-resistance arterial and venous waveforms in both ovaries. Other: No free fluid. IMPRESSION: 1. No evidence for ovarian torsion. 2. Bilateral complex ovarian cyst. The left ovarian cyst has diffuse low-level internal echoes with lace-like architecture typical for a benign hemorrhagic cyst. The right ovary cyst is more  complicated with heterogeneous internal echoes which is favored to represent retracting clot. Recommend follow-up US  in 3-6 months. Note: This recommendation does not apply to premenarchal patients or to those with increased risk (genetic, family history, elevated tumor markers or other high-risk factors) of ovarian cancer. Reference: Radiology 2019 Nov; 293(2):359-371. 3. Uterine fibroid. Electronically Signed   By: Kimberley Penman M.D.   On: 01/28/2024 13:49   CT ABDOMEN PELVIS W CONTRAST Result Date: 01/28/2024 CLINICAL DATA:  33 year old female with abdominal pain. EXAM: CT ABDOMEN AND PELVIS WITH  CONTRAST TECHNIQUE: Multidetector CT imaging of the abdomen and pelvis was performed using the standard protocol following bolus administration of intravenous contrast. RADIATION DOSE REDUCTION: This exam was performed according to the departmental dose-optimization program which includes automated exposure control, adjustment of the mA and/or kV according to patient size and/or use of iterative reconstruction technique. CONTRAST:  75mL OMNIPAQUE  IOHEXOL  350 MG/ML SOLN COMPARISON:  CT Abdomen and Pelvis, pelvis ultrasound 05/14/2023 and earlier. FINDINGS: Lower chest: Similar asymmetric costophrenic angle atelectasis and/or scarring, left greater than right. No pericardial or pleural effusion. Normal heart size. Hepatobiliary: Mild motion artifact, negative liver and gallbladder. Pancreas: Negative. Spleen: Negative. Adrenals/Urinary Tract: Mild motion artifact. Adrenal glands and kidneys appears stable and within normal limits. No nephrolithiasis. No hydronephrosis or hydroureter. Unremarkable bladder. Stomach/Bowel: Gas-filled rectum. Nondilated upstream large bowel with retained stool most pronounced in the transverse colon. Normal appendix medial to the cecum on series 3, image 50 and no large bowel inflammation. No dilated small bowel. Decompressed stomach and duodenum. No pneumoperitoneum, free fluid, mesenteric inflammation identified. Small chronic left para umbilical fat containing hernia appears stable. No herniated bowel. Vascular/Lymphatic: Portal venous phase dominant contrast timing. Portal venous structures, may return ir arterial structures in the abdomen and pelvis appear patent and within normal limits. No lymphadenopathy. Reproductive: Simple appearing ovarian cysts, simple fluid density measuring 3.9 cm to the left of midline and 4.5 cm to the right (no follow-up imaging recommended). Superimposed heterogeneously enhancing roughly 4 cm right uterine fibroid, series 6, image 49, also demonstrated  on previous ultrasound. Other: No pelvis free fluid. Musculoskeletal: Negative. IMPRESSION: 1. No acute or inflammatory process identified in the abdomen. Normal appendix. 2. Chronic uterine fibroid, and ovarian cysts which do Not meet size criteria for follow-up. Electronically Signed   By: Marlise Simpers M.D.   On: 01/28/2024 07:28    I personally reviewed Labs and Imaging Studies under Results section.    ASSESSMENT/PLAN: Pelvic pain Ovarian cyst  -leukocytosis resolved -GC/C still pending -low suspicion for PID, may consider oral antibiotics prophylactically -ok for outpatient management/discharge -encouraged pt to follow up at [facility] for any concerns/worsening of symptoms  DVT Prophylaxis:  Ambulation  Jourdyn Hasler, DO Attending Obstetrician & Gynecologist, [facility] for [employer], [facility]

## 2024-01-30 ENCOUNTER — Ambulatory Visit: Payer: Self-pay | Admitting: Internal Medicine

## 2024-01-30 ENCOUNTER — Telehealth: Payer: Self-pay

## 2024-01-30 ENCOUNTER — Other Ambulatory Visit: Payer: Self-pay | Admitting: Internal Medicine

## 2024-01-30 LAB — GC/CHLAMYDIA PROBE AMP (~~LOC~~) NOT AT ARMC
Chlamydia: POSITIVE — AB
Comment: NEGATIVE
Comment: NORMAL
Neisseria Gonorrhea: NEGATIVE

## 2024-01-30 LAB — URINE CULTURE: Culture: NO GROWTH

## 2024-01-30 LAB — HEMOGLOBIN A1C
Hgb A1c MFr Bld: 5 % (ref 4.8–5.6)
Mean Plasma Glucose: 97 mg/dL

## 2024-01-30 NOTE — Transitions of Care (Post Inpatient/ED Visit) (Signed)
   01/30/2024  Name: Paula Lucas MRN: [id number] DOB: November 23, 1990  Today's TOC FU Call Status: Today's TOC FU Call Status:: Unsuccessful Call (1st Attempt) Unsuccessful Call (1st Attempt) Date: 01/30/24  Attempted to reach the patient regarding the most recent Inpatient/ED visit.  Follow Up Plan: Additional outreach attempts will be made to reach the patient to complete the Transitions of Care (Post Inpatient/ED visit) call.   Dr Lincoln Renshaw is listed as PCP but she has not seen the patient since 03/2022.    Signature  Burnett Carson, RN

## 2024-01-30 NOTE — Progress Notes (Signed)
 Let patient know that she tested positive for chlamydia on cultures that were done at the hospital.  If she was sent home on doxycycline  antibiotics to treat this infection, she should complete the entire course of the antibiotics.  If she was not sent home with antibiotics, we can prescribe for her.  Let me know which pharmacy.  Her partner should also be treated before having unprotected sex again.

## 2024-01-31 ENCOUNTER — Telehealth: Payer: Self-pay

## 2024-01-31 ENCOUNTER — Other Ambulatory Visit: Payer: Self-pay | Admitting: Obstetrics & Gynecology

## 2024-01-31 ENCOUNTER — Encounter: Payer: Self-pay | Admitting: Obstetrics & Gynecology

## 2024-01-31 DIAGNOSIS — A749 Chlamydial infection, unspecified: Secondary | ICD-10-CM

## 2024-01-31 MED ORDER — DOXYCYCLINE HYCLATE 100 MG PO TABS: 100.0000 mg | ORAL_TABLET | Freq: Two times a day (BID) | ORAL | 1 refills | Status: AC

## 2024-01-31 NOTE — Transitions of Care (Post Inpatient/ED Visit) (Signed)
   01/31/2024  Name: Paula Lucas MRN: [id number] DOB: 1991-03-26  Today's TOC FU Call Status: Today's TOC FU Call Status:: Unsuccessful Call (2nd Attempt) Unsuccessful Call (1st Attempt) Date: 01/30/24 Unsuccessful Call (2nd Attempt) Date: 01/31/24  Attempted to reach the patient regarding the most recent Inpatient/ED visit.  Follow Up Plan: Additional outreach attempts will be made to reach the patient to complete the Transitions of Care (Post Inpatient/ED visit) call.   Signature Burnett Carson, RN

## 2024-01-31 NOTE — Progress Notes (Signed)
 Rx for Doxy due to Chlamydia, refill sent in for partner  Margan Elias, DO Attending Obstetrician & Gynecologist, [facility] [employer], [facility]

## 2024-02-01 ENCOUNTER — Observation Stay (HOSPITAL_COMMUNITY)
Admission: EM | Admit: 2024-02-01 | Discharge: 2024-02-02 | Discharge status: Against Medical Advice | Payer: MEDICAID | Attending: Internal Medicine | Admitting: Internal Medicine

## 2024-02-01 ENCOUNTER — Encounter (HOSPITAL_COMMUNITY): Payer: Self-pay | Admitting: Emergency Medicine

## 2024-02-01 ENCOUNTER — Emergency Department (HOSPITAL_COMMUNITY): Payer: MEDICAID

## 2024-02-01 ENCOUNTER — Telehealth: Payer: Self-pay

## 2024-02-01 ENCOUNTER — Other Ambulatory Visit: Payer: Self-pay

## 2024-02-01 DIAGNOSIS — Z87891 Personal history of nicotine dependence: Secondary | ICD-10-CM | POA: Diagnosis not present

## 2024-02-01 DIAGNOSIS — E162 Hypoglycemia, unspecified: Secondary | ICD-10-CM | POA: Diagnosis not present

## 2024-02-01 DIAGNOSIS — Z79899 Other long term (current) drug therapy: Secondary | ICD-10-CM | POA: Diagnosis not present

## 2024-02-01 DIAGNOSIS — F259 Schizoaffective disorder, unspecified: Secondary | ICD-10-CM | POA: Insufficient documentation

## 2024-02-01 DIAGNOSIS — N739 Female pelvic inflammatory disease, unspecified: Secondary | ICD-10-CM | POA: Diagnosis not present

## 2024-02-01 DIAGNOSIS — A749 Chlamydial infection, unspecified: Secondary | ICD-10-CM | POA: Diagnosis not present

## 2024-02-01 DIAGNOSIS — T383X5A Adverse effect of insulin and oral hypoglycemic [antidiabetic] drugs, initial encounter: Secondary | ICD-10-CM | POA: Diagnosis present

## 2024-02-01 DIAGNOSIS — F250 Schizoaffective disorder, bipolar type: Secondary | ICD-10-CM | POA: Diagnosis present

## 2024-02-01 DIAGNOSIS — R569 Unspecified convulsions: Principal | ICD-10-CM | POA: Insufficient documentation

## 2024-02-01 DIAGNOSIS — J45909 Unspecified asthma, uncomplicated: Secondary | ICD-10-CM | POA: Diagnosis not present

## 2024-02-01 DIAGNOSIS — N730 Acute parametritis and pelvic cellulitis: Secondary | ICD-10-CM

## 2024-02-01 LAB — BASIC METABOLIC PANEL WITH GFR
Anion gap: 8 (ref 5–15)
BUN: 11 mg/dL (ref 6–20)
CO2: 20 mmol/L — ABNORMAL LOW (ref 22–32)
Calcium: 8.9 mg/dL (ref 8.9–10.3)
Chloride: 112 mmol/L — ABNORMAL HIGH (ref 98–111)
Creatinine, Ser: 0.67 mg/dL (ref 0.44–1.00)
GFR, Estimated: >60 mL/min (ref >60)
Glucose, Bld: 39 mg/dL — CL (ref 70–99)
Potassium: 3.5 mmol/L (ref 3.5–5.1)
Sodium: 140 mmol/L (ref 135–145)

## 2024-02-01 LAB — CBG MONITORING, ED
Glucose-Capillary: 47 mg/dL — ABNORMAL LOW (ref 70–99)
Glucose-Capillary: 95 mg/dL (ref 70–99)
Glucose-Capillary: 77 mg/dL (ref 70–99)
Glucose-Capillary: 92 mg/dL (ref 70–99)
Glucose-Capillary: 111 mg/dL — ABNORMAL HIGH (ref 70–99)
Glucose-Capillary: 102 mg/dL — ABNORMAL HIGH (ref 70–99)
Glucose-Capillary: 99 mg/dL (ref 70–99)

## 2024-02-01 LAB — CBC WITH DIFFERENTIAL/PLATELET
Abs Immature Granulocytes: 0.06 10*3/uL (ref 0.00–0.07)
Basophils Absolute: 0.1 10*3/uL (ref 0.0–0.1)
Basophils Relative: 0 %
Eosinophils Absolute: 0 10*3/uL (ref 0.0–0.5)
Eosinophils Relative: 0 %
HCT: 48.7 % — ABNORMAL HIGH (ref 36.0–46.0)
Hemoglobin: 15.8 g/dL — ABNORMAL HIGH (ref 12.0–15.0)
Immature Granulocytes: 0 %
Lymphocytes Relative: 8 %
Lymphs Abs: 1.1 10*3/uL (ref 0.7–4.0)
MCH: 31.3 pg (ref 26.0–34.0)
MCHC: 32.4 g/dL (ref 30.0–36.0)
MCV: 96.6 fL (ref 80.0–100.0)
Monocytes Absolute: 0.3 10*3/uL (ref 0.1–1.0)
Monocytes Relative: 2 %
Neutro Abs: 12.5 10*3/uL — ABNORMAL HIGH (ref 1.7–7.7)
Neutrophils Relative %: 90 %
Platelets: 264 10*3/uL (ref 150–400)
RBC: 5.04 MIL/uL (ref 3.87–5.11)
RDW: 14 % (ref 11.5–15.5)
WBC: 14 10*3/uL — ABNORMAL HIGH (ref 4.0–10.5)
nRBC: 0 % (ref 0.0–0.2)

## 2024-02-01 LAB — HCG, QUANTITATIVE, PREGNANCY: hCG, Beta Chain, Quant, S: 1 m[IU]/mL (ref <5)

## 2024-02-01 MED ORDER — HYDROMORPHONE HCL 1 MG/ML IJ SOLN
0.5000 mg | Freq: Once | INTRAMUSCULAR | Status: AC
  Administered 2024-02-02: 0.5 mg via INTRAVENOUS
  Filled 2024-02-01: qty 1

## 2024-02-01 MED ORDER — HYDROMORPHONE HCL 1 MG/ML IJ SOLN
0.5000 mg | Freq: Once | INTRAMUSCULAR | Status: AC
  Administered 2024-02-01: 0.5 mg via INTRAVENOUS
  Filled 2024-02-01: qty 1

## 2024-02-01 MED ORDER — LACTATED RINGERS IV BOLUS
1000.0000 mL | Freq: Once | INTRAVENOUS | Status: AC
  Administered 2024-02-01: 1000 mL via INTRAVENOUS

## 2024-02-01 MED ORDER — AZITHROMYCIN 1 G PO PACK
1.0000 g | PACK | Freq: Once | ORAL | Status: AC
  Administered 2024-02-01: 1 g via ORAL
  Filled 2024-02-01: qty 1

## 2024-02-01 MED ORDER — SODIUM CHLORIDE 0.9 % IV SOLN: 250.0000 mL | INTRAVENOUS | Status: DC | PRN

## 2024-02-01 MED ORDER — SODIUM CHLORIDE 0.9% FLUSH: 3.0000 mL | INTRAVENOUS | Status: DC | PRN

## 2024-02-01 MED ORDER — DEXTROSE 10 % IV SOLN: INTRAVENOUS | Status: DC

## 2024-02-01 MED ORDER — SODIUM CHLORIDE 0.9 % IV SOLN
1.0000 g | Freq: Once | INTRAVENOUS | Status: AC
  Administered 2024-02-01: 1 g via INTRAVENOUS
  Filled 2024-02-01: qty 10

## 2024-02-01 MED ORDER — METRONIDAZOLE 500 MG/100ML IV SOLN
500.0000 mg | Freq: Once | INTRAVENOUS | Status: AC
  Administered 2024-02-01: 500 mg via INTRAVENOUS
  Filled 2024-02-01: qty 100

## 2024-02-01 MED ORDER — SODIUM CHLORIDE 0.9% FLUSH
3.0000 mL | Freq: Two times a day (BID) | INTRAVENOUS | Status: DC
  Administered 2024-02-01: 3 mL via INTRAVENOUS

## 2024-02-01 MED ORDER — DEXTROSE 50 % IV SOLN: 1.0000 | Freq: Once | INTRAVENOUS | Status: AC

## 2024-02-01 MED ORDER — DEXTROSE 50 % IV SOLN
INTRAVENOUS | Status: AC
  Administered 2024-02-01: 50 mL via INTRAVENOUS
  Filled 2024-02-01: qty 50

## 2024-02-01 MED ORDER — FENTANYL CITRATE PF 50 MCG/ML IJ SOSY
50.0000 ug | PREFILLED_SYRINGE | Freq: Once | INTRAMUSCULAR | Status: AC
  Administered 2024-02-01: 50 ug via INTRAVENOUS
  Filled 2024-02-01: qty 1

## 2024-02-01 MED ORDER — DEXTROSE 50 % IV SOLN
INTRAVENOUS | Status: AC
  Filled 2024-02-01: qty 50

## 2024-02-01 NOTE — ED Notes (Signed)
 Transported to ultrasound

## 2024-02-01 NOTE — ED Provider Notes (Signed)
 [place] EMERGENCY DEPARTMENT AT [facility] Provider Note   CSN: [id number] Arrival date & time: 02/01/24  1416     History  Chief Complaint  Patient presents with   Hypoglycemia    Paula Lucas is a 33 y.o. female.  HPI   33 year old female with medical history significant for Fitz-Hugh Curtis syndrome, pelvic abscess, schizoaffective disorder, DM2, ovarian cysts who presents to the emergency department after a seizure.  The patient was just admitted to the hospital on 6/7.  She had been hypoglycemic in the setting of insulin  use.  The patient had been advised to stop her insulin  due to hypoglycemic episodes despite its use.  She states that she took 60 units of insulin  which was her normal dose prior to being advised to stop it.  She was hypoglycemic with a blood sugar of 44 and had a 20-minute seizure episode at home according to her significant other who is present bedside.  She thinks she did bit her tongue and did urinate on herself.  This has happened to her before according to her.  She received 25 g of dextrose  with EMS.  On arrival, the patient was mildly confused, repeat CBG after initial management was also hypoglycemic at 47.  The patient was administered D50 on arrival.  She continues to complain of abdominal pain.  Her workup for this to include CT imaging as well as ultrasound imaging had shown bilateral complex ovarian cyst but otherwise unremarkable.  Gynecology had been previously consulted inpatient.  The patient had tested positive for chlamydia and had been treated with doxycycline .  Home Medications Prior to Admission medications   Medication Sig Start Date End Date Taking? Authorizing Provider  doxycycline  (VIBRA -TABS) 100 MG tablet Take 1 tablet (100 mg total) by mouth 2 (two) times daily for 7 days. 01/31/24 02/07/24  Ozan, Jennifer, DO      Allergies    Fish allergy, Motrin [ibuprofen], Mushroom extract complex (obsolete), Shellfish-derived  products, and Tylenol  [acetaminophen ]    Review of Systems   Review of Systems  All other systems reviewed and are negative.   Physical Exam Updated Vital Signs BP 114/81   Pulse 80   Temp (!) 97.5 F (36.4 C)   Resp 15   LMP 11/23/2023 (Exact Date)   SpO2 100%  Physical Exam Vitals and nursing note reviewed.  Constitutional:      General: She is not in acute distress.    Appearance: She is well-developed.     Comments: GCS 14, ABC intact  HENT:     Head: Normocephalic and atraumatic.  Eyes:     Conjunctiva/sclera: Conjunctivae normal.  Cardiovascular:     Rate and Rhythm: Normal rate and regular rhythm.  Pulmonary:     Effort: Pulmonary effort is normal. No respiratory distress.     Breath sounds: Normal breath sounds.  Abdominal:     Palpations: Abdomen is soft.     Tenderness: There is abdominal tenderness. There is guarding.     Comments: Lower abdominal TTP  Musculoskeletal:        General: No swelling.     Cervical back: Neck supple.  Skin:    General: Skin is warm and dry.     Capillary Refill: Capillary refill takes less than 2 seconds.  Neurological:     General: No focal deficit present.     Mental Status: She is alert and oriented to person, place, and time.     Comments: GCS 14  Psychiatric:        Mood and Affect: Mood normal.     ED Results / Procedures / Treatments   Labs (all labs ordered are listed, but only abnormal results are displayed) Labs Reviewed  CBC WITH DIFFERENTIAL/PLATELET - Abnormal; Notable for the following components:      Result Value   WBC 14.0 (*)    Hemoglobin 15.8 (*)    HCT 48.7 (*)    Neutro Abs 12.5 (*)    All other components within normal limits  CBG MONITORING, ED - Abnormal; Notable for the following components:   Glucose-Capillary 47 (*)    All other components within normal limits  HCG, QUANTITATIVE, PREGNANCY  BASIC METABOLIC PANEL WITH GFR  CBG MONITORING, ED  CBG MONITORING, ED  CBG MONITORING, ED     EKG None  Radiology No results found.  Procedures Procedures    Medications Ordered in ED Medications  sodium chloride  flush (NS) 0.9 % injection 3 mL (3 mLs Intravenous Given 02/01/24 1501)  sodium chloride  flush (NS) 0.9 % injection 3 mL (has no administration in time range)  0.9 %  sodium chloride  infusion (has no administration in time range)  cefTRIAXone  (ROCEPHIN ) 1 g in sodium chloride  0.9 % 100 mL IVPB (1 g Intravenous New Bag/Given 02/01/24 1555)  metroNIDAZOLE  (FLAGYL ) IVPB 500 mg (has no administration in time range)  lactated ringers  bolus 1,000 mL (has no administration in time range)  dextrose  50 % solution 50 mL (50 mLs Intravenous Given 02/01/24 1437)  azithromycin  (ZITHROMAX ) powder 1 g (1 g Oral Given 02/01/24 1501)  fentaNYL  (SUBLIMAZE ) injection 50 mcg (50 mcg Intravenous Given 02/01/24 1556)    ED Course/ Medical Decision Making/ A&P Clinical Course as of 02/01/24 1612  Wed Feb 01, 2024  1433 Glucose-Capillary(!): 47 [JL]  1546 WBC(!): 14.0 [JL]    Clinical Course User Index [JL] Rosealee Concha, MD                                 Medical Decision Making Amount and/or Complexity of Data Reviewed Labs: ordered. Decision-making details documented in ED Course. Radiology: ordered.  Risk Prescription drug management.    33 year old female with medical history significant for Fitz-Hugh Curtis syndrome, pelvic abscess, schizoaffective disorder, DM2, ovarian cysts who presents to the emergency department after a seizure.  The patient was just admitted to the hospital on 6/7.  She had been hypoglycemic in the setting of insulin  use.  The patient had been advised to stop her insulin  due to hypoglycemic episodes despite its use.  She states that she took 60 units of insulin  which was her normal dose prior to being advised to stop it.  She was hypoglycemic with a blood sugar of 44 and had a 20-minute seizure episode at home according to her significant other who  is present bedside.  She thinks she did bit her tongue and did urinate on herself.  This has happened to her before according to her.  She received 25 g of dextrose  with EMS.  On arrival, the patient was mildly confused, repeat CBG after initial management was also hypoglycemic at 47.  The patient was administered D50 on arrival.  She continues to complain of abdominal pain.  Her workup for this to include CT imaging as well as ultrasound imaging had shown bilateral complex ovarian cyst but otherwise unremarkable.  Gynecology had been previously consulted inpatient.  The patient had  tested positive for chlamydia and had been treated with doxycycline .  On arrival, the patient was afebrile, not tachycardic or tachypneic, BP 118/92, saturating 100% on room air.  Physical exam revealed bilateral lower abdominal tenderness to palpation.  Patient presenting with seizure activity in the setting of hypoglycemia due to exogenous insulin  administration AGAINST MEDICAL ADVICE.  After initial treatment with EMS, patient's blood sugars continued to trend down and therefore she was administered D50.  Patient also presenting with concern for PID.  Had been initiated with treatment in the hospital but does not appear that this treatment was continued.  Some report of pregnancy during the patient's recent hospitalization noted and the patient was administered 1 g azithromycin  initially however further digging, the patient was found to not be pregnant and this was confirmed on hCG testing here in the ER.  Repeat CBG improved from initial hypoglycemia with a recheck of 95.  Patient will continue to be monitored in the emergency department.  Labs: CBC with a leukocytosis to 14, hemoglobin of 15.8, suspect likely hemoconcentration in addition to mild leukocytosis in the setting of potential PID.  IV access was obtained the patient was administered IV Rocephin  as well as IV Flagyl .  A pelvic ultrasound was performed to further  evaluate the patient.  Given the patient's need for pain control, multiple episodes of hypoglycemia in setting of exogenous insulin  administration, I did recommend admission for further evaluation.  Notified by nursing that the patient's CBG on recheck was 1004, feel that this was likely drawn from the line where D50 was administered.  Plan to follow-up results of ultrasound and remaining laboratory evaluation, likely plan for repeat admission in the setting of the patient's presentation. Signout given to Dr. Daivd Dub at [phone].  Final Clinical Impression(s) / ED Diagnoses Final diagnoses:  Hypoglycemia  Seizure (HCC)  Chlamydia infection  PID (acute pelvic inflammatory disease)    Rx / DC Orders ED Discharge Orders     None         Rosealee Concha, MD 02/01/24 1612

## 2024-02-01 NOTE — ED Provider Notes (Signed)
 Sz today. H/O hypoglycemic episodes. Having recurrent hypoglycemia. Got D50. PID getting US . Never completed TX. Getting US . Needs admission for recurrent hypoglycemia with sz but also sz hx. Physical Exam  BP 114/81   Pulse 80   Temp (!) 97.5 F (36.4 C)   Resp 15   LMP 11/23/2023 (Exact Date)   SpO2 100%   Physical Exam Constitutional:      Comments: Alert with clear mental status.  HENT:     Mouth/Throat:     Pharynx: Oropharynx is clear.  Cardiovascular:     Rate and Rhythm: Normal rate and regular rhythm.  Pulmonary:     Effort: Pulmonary effort is normal.     Breath sounds: Normal breath sounds.  Abdominal:     Comments: Patient endorses diffuse tenderness to palpation of the lower abdomen.  No guarding.  Musculoskeletal:        General: Normal range of motion.  Skin:    General: Skin is warm and dry.  Neurological:     General: No focal deficit present.     Mental Status: She is oriented to person, place, and time.     Procedures  Procedures  ED Course / MDM   Clinical Course as of 02/01/24 1630  Wed Feb 01, 2024  1433 Glucose-Capillary(!): 47 [JL]  1546 WBC(!): 14.0 [JL]    Clinical Course User Index [JL] Jerrol Agent, MD   Medical Decision Making Amount and/or Complexity of Data Reviewed Labs: ordered. Decision-making details documented in ED Course. Radiology: ordered.  Risk Prescription drug management.   Patient reports having taken 60 units of 70/30 insulin  this morning.  She reports that her physician she has had for a long time as her prescriber.  She describes being treated with insulin  intermittently based on her physician's instructions since she was in her mid 20's.  Patient describes having been treated with several different forms of insulin  and using a Dexcom continuous monitor. Patient is have longstanding lower abdominal pain.  She reports that she continues have pain and needs pain control.  Patient was treated for PID with prior dose  of Rocephin  and Zithromax .  At this time we will give as needed pain control.  Blood sugars have vacillated.  Patient was on D10 drip and required several doses of D50.  Currently have taken her off the drip and blood sugars are 99 followed by 75.  Slightly trending down.  With downtrending blood sugars and significant hypoglycemia previously, will plan for observation admission.  Patient has reported recurrent problems with abdominal pain.  As outlined above this has had diagnostic evaluation and treatment.  Pain control as needed, however this does appear fairly persistent despite previous treatments.       Armenta Canning, MD 02/13/24 [phone]

## 2024-02-01 NOTE — ED Notes (Signed)
 Per Dr Daivd Dub, check BG while pt is in US . If BG is low, give D50 and start D10 @ 166mL/HR.

## 2024-02-01 NOTE — ED Notes (Addendum)
 Glucose 1,004, per lab. Will recollect. Provider made aware

## 2024-02-01 NOTE — ED Notes (Signed)
 Pt refused CBG check stating that she won't get that done until she speaks to the doctor

## 2024-02-01 NOTE — ED Notes (Signed)
 Glucose 39, per lab. Provider aware.

## 2024-02-01 NOTE — ED Notes (Signed)
 [phone] please give update to mother NEW phone number

## 2024-02-01 NOTE — ED Triage Notes (Signed)
 Pt BIB [facility] with reports of hypoglycemia and seizure at home. Pt reports she took 60 unit of insulin  which is her normal dose. According to the patient she was instructed to stop her insulin .  Pt received 25G dextrose  on EMS. Initial BS 44.

## 2024-02-01 NOTE — ED Notes (Signed)
 BG 77. Per Dr Daivd Dub, okay to start D10 @ 115mL/HR and recheck BG [facility]

## 2024-02-01 NOTE — ED Notes (Signed)
 Patient refused to be hooked up to the monitor to get vital signs

## 2024-02-01 NOTE — ED Notes (Signed)
 Patient reporting pain in her abdomen and requesting pain meds. EDP Pfeiffer notified.

## 2024-02-01 NOTE — ED Notes (Signed)
 Per Dr. Daivd Dub D10 turned off. CGB 102

## 2024-02-01 NOTE — ED Notes (Signed)
 Pt refusing to keep vital signs equipment one. Pt voiced concern about their care and was requesting a new doctor. MD and charge RN notified.

## 2024-02-01 NOTE — ED Notes (Signed)
 Patient refusing blood sugar checks until EDP sees her. Patient is upset she has not gotten anything for pain. EDP Pfieffer notified.

## 2024-02-01 NOTE — Transitions of Care (Post Inpatient/ED Visit) (Signed)
   02/01/2024  Name: Paula Lucas MRN: [id number] DOB: [birth year]  Today's TOC FU Call Status: Today's TOC FU Call Status:: Unsuccessful Call (3rd Attempt) Unsuccessful Call (1st Attempt) Date: 01/30/24 Unsuccessful Call (2nd Attempt) Date: 01/31/24 Unsuccessful Call (3rd Attempt) Date: 02/01/24  Attempted to reach the patient regarding the most recent Inpatient/ED visit.  Follow Up Plan: No further outreach attempts will be made at this time. We have been unable to contact the patient.  Letter sent to patient to contact [facility] to schedule a follow up appointment as we have not been able to reach her.   Dr Lincoln Renshaw is listed as PCP but the patient has not seen her since 03/2022.   Signature Burnett Carson, RN

## 2024-02-02 ENCOUNTER — Encounter (HOSPITAL_COMMUNITY): Payer: Self-pay | Admitting: Family Medicine

## 2024-02-02 DIAGNOSIS — E160 Drug-induced hypoglycemia without coma: Secondary | ICD-10-CM

## 2024-02-02 DIAGNOSIS — E162 Hypoglycemia, unspecified: Secondary | ICD-10-CM | POA: Diagnosis not present

## 2024-02-02 DIAGNOSIS — F250 Schizoaffective disorder, bipolar type: Secondary | ICD-10-CM

## 2024-02-02 DIAGNOSIS — A749 Chlamydial infection, unspecified: Secondary | ICD-10-CM

## 2024-02-02 DIAGNOSIS — R569 Unspecified convulsions: Principal | ICD-10-CM

## 2024-02-02 DIAGNOSIS — T383X5A Adverse effect of insulin and oral hypoglycemic [antidiabetic] drugs, initial encounter: Secondary | ICD-10-CM

## 2024-02-02 LAB — CBC
HCT: 41.4 % (ref 36.0–46.0)
Hemoglobin: 13.1 g/dL (ref 12.0–15.0)
MCH: 31 pg (ref 26.0–34.0)
MCHC: 31.6 g/dL (ref 30.0–36.0)
MCV: 97.9 fL (ref 80.0–100.0)
Platelets: 298 10*3/uL (ref 150–400)
RBC: 4.23 MIL/uL (ref 3.87–5.11)
RDW: 13.9 % (ref 11.5–15.5)
WBC: 14.2 10*3/uL — ABNORMAL HIGH (ref 4.0–10.5)
nRBC: 0 % (ref 0.0–0.2)

## 2024-02-02 LAB — BASIC METABOLIC PANEL WITH GFR
Anion gap: 8 (ref 5–15)
BUN: 9 mg/dL (ref 6–20)
CO2: 23 mmol/L (ref 22–32)
Calcium: 8.7 mg/dL — ABNORMAL LOW (ref 8.9–10.3)
Chloride: 109 mmol/L (ref 98–111)
Creatinine, Ser: 0.9 mg/dL (ref 0.44–1.00)
GFR, Estimated: >60 mL/min (ref >60)
Glucose, Bld: 97 mg/dL (ref 70–99)
Potassium: 3.7 mmol/L (ref 3.5–5.1)
Sodium: 140 mmol/L (ref 135–145)

## 2024-02-02 LAB — CBG MONITORING, ED
Glucose-Capillary: 117 mg/dL — ABNORMAL HIGH (ref 70–99)
Glucose-Capillary: 123 mg/dL — ABNORMAL HIGH (ref 70–99)
Glucose-Capillary: 54 mg/dL — ABNORMAL LOW (ref 70–99)
Glucose-Capillary: 75 mg/dL (ref 70–99)

## 2024-02-02 MED ORDER — OXYCODONE HCL 5 MG PO TABS
5.0000 mg | ORAL_TABLET | ORAL | Status: DC | PRN
  Administered 2024-02-02: 5 mg via ORAL
  Filled 2024-02-02: qty 1

## 2024-02-02 MED ORDER — ONDANSETRON HCL 4 MG/2ML IJ SOLN: 4.0000 mg | Freq: Four times a day (QID) | INTRAMUSCULAR | Status: DC | PRN

## 2024-02-02 MED ORDER — SODIUM CHLORIDE 0.9% FLUSH: 3.0000 mL | Freq: Two times a day (BID) | INTRAVENOUS | Status: DC

## 2024-02-02 MED ORDER — OXYCODONE HCL 5 MG PO TABS
5.0000 mg | ORAL_TABLET | ORAL | Status: DC | PRN
  Administered 2024-02-02: 10 mg via ORAL
  Filled 2024-02-02: qty 2

## 2024-02-02 MED ORDER — ONDANSETRON HCL 4 MG PO TABS: 4.0000 mg | ORAL_TABLET | Freq: Four times a day (QID) | ORAL | Status: DC | PRN

## 2024-02-02 MED ORDER — TRAMADOL HCL 50 MG PO TABS: 50.0000 mg | ORAL_TABLET | Freq: Three times a day (TID) | ORAL | Status: DC | PRN

## 2024-02-02 MED ORDER — ENOXAPARIN SODIUM 40 MG/0.4ML IJ SOSY: 40.0000 mg | PREFILLED_SYRINGE | Freq: Every day | INTRAMUSCULAR | Status: DC

## 2024-02-02 MED ORDER — HYDROMORPHONE HCL 1 MG/ML IJ SOLN
0.5000 mg | INTRAMUSCULAR | Status: DC | PRN
  Administered 2024-02-02: 0.5 mg via INTRAVENOUS
  Filled 2024-02-02: qty 1

## 2024-02-02 MED ORDER — POLYETHYLENE GLYCOL 3350 17 G PO PACK: 17.0000 g | PACK | Freq: Every day | ORAL | Status: DC | PRN

## 2024-02-02 NOTE — ED Notes (Signed)
 Pt given sandwich bag, juice, and soda.

## 2024-02-02 NOTE — ED Notes (Signed)
 The Nurse was informed of patient CBG being low.

## 2024-02-02 NOTE — ED Notes (Signed)
 Pt given juice and crackers.

## 2024-02-02 NOTE — Discharge Summary (Signed)
 Discharged AGAINST MEDICAL ADVICE  Brief narrative: Paula Lucas is a 33 y.o. female with PMH significant for schizoaffective disorder, h/o PID who does not seem to have diabetes but for some reason continues to inject herself insulin  leading to hypoglycemic episode.  Recently hospitalized for the same 6/7 -6/8, was instructed to stop insulin . 6/11, it seems patient injected herself 60 units of insulin  and ended up having generalized seizure.  EMS noted blood sugar level of 39, give IV dextrose .  In the ED, patient was hemodynamically stable, postictal. Labs showed WBC count of 14, hemoglobin 15.8  Admitted to [facility].      Before I could see the patient this morning, patient left AMA.. Few minutes prior to that, I was called by nurse regarding her demand of IV pain medicines.  My instruction was to wait for next 30 minutes, to recheck blood pressure and give low-dose Dilaudid  if blood pressure allows.  Patient apparently did not want to wait and decided to leave AMA.  I tried to call her number and her partner's number to reiterate NOT to use insulin  again.  However, both the numbers listed in the chart are not workiing. I tried to call her mother's number as listed but it is a wrong number.

## 2024-02-02 NOTE — H&P (Signed)
 History and Physical    Paula Lucas ZOX:[id number] DOB: April 29, 1991 DOA: 02/01/2024  PCP: Lawrance Presume, MD   Patient coming from: Home   Chief Complaint: Seizure   HPI: Paula Lucas is a 33 y.o. female with medical history significant for schizoaffective disorder and history of PID who was recently admitted with pelvic pain and hypoglycemia, was instructed to stop insulin , but took 60 units of insulin  today despite understanding her instructions not to take it, and then had a generalized seizure.  Patient complains of pain in her lower abdomen that has been severe for more than a week now.  She was evaluated for this during the recent admission, had a CT of her abdomen and pelvis with no acute findings, and was evaluated by OB/GYN who had low suspicion for PID.  She denies fevers or chills, nausea or vomiting, constipation or diarrhea.  She reports pain in her mouth that she attributes to biting her tongue and cheek during her seizure.  CBG was in the 40s with EMS and she was given IV dextrose  prior to arrival in the ED.  ED Course: Upon arrival to the ED, patient is found to be afebrile and saturating well on room air with normal HR and stable BP.  Labs are most notable for glucose 39, WBC 14,000, and hemoglobin 15.8.  Pelvic ultrasound demonstrates decreased hemorrhagic cyst in the bilateral ovaries.  She was treated in the ED with IV dextrose , 1 L of LR, fentanyl , 2 doses of Dilaudid , Rocephin , and azithromycin .  Review of Systems:  All other systems reviewed and apart from HPI, are negative.  Past Medical History:  Diagnosis Date   Asthma    BMI 31.0-31.9,adult    Chlamydia    Gonorrhea    TOA (tubo-ovarian abscess)     Past Surgical History:  Procedure Laterality Date   INDUCED ABORTION     IR RADIOLOGIST EVAL & MGMT  05/17/2018   LAPAROTOMY N/A 02/16/2022   Procedure: EXPLORATORY LAPAROTOMY WITH ABDOMINAL WASHOUT;  Surgeon: Granville Layer, MD;  Location: [facility]  OR;  Service: Gynecology;  Laterality: N/A;   SMALL BOWEL REPAIR N/A 02/16/2022   Procedure: SMALL BOWEL REPAIR;  Surgeon: Dorena Gander, MD;  Location[facility];  Service: General;  Laterality: N/A;   TONSILLECTOMY      Social History:   reports that she has quit smoking. Her smoking use included cigarettes. She has a 4.5 pack-year smoking history. She has never used smokeless tobacco. She reports that she does not currently use drugs after having used the following drugs: Marijuana. She reports that she does not drink alcohol .  Allergies  Allergen Reactions   Fish Allergy Anaphylaxis    Severe allergic reaction to all seafoods   Motrin [Ibuprofen] Anaphylaxis    Pt tolerated Ketorolac  04/02/18 Pt tolerated toradol  during hosp admission in July 2023   Mushroom Extract Complex (Obsolete) Anaphylaxis   Shellfish-Derived Products Anaphylaxis   Tylenol  [Acetaminophen ] Anaphylaxis    Family History  Problem Relation Age of Onset   Diabetes Mother    Hypertension Mother    Diabetes Father    Hypertension Father    Diabetes Sister    Hypertension Sister      Prior to Admission medications   Medication Sig Start Date End Date Taking? Authorizing Provider  doxycycline  (VIBRA -TABS) 100 MG tablet Take 1 tablet (100 mg total) by mouth 2 (two) times daily for 7 days. 01/31/24 02/07/24  Ozan, Jennifer, DO    Physical Exam:  Vitals:   02/01/24 1645 02/01/24 1700 02/01/24 1800 02/01/24 2319  BP: 113/85 108/79 119/81 103/64  Pulse: 74 70 73 88  Resp:   18 16  Temp:   97.9 F (36.6 C) 98.4 F (36.9 C)  TempSrc:   Oral Oral  SpO2: 100% 100% 100% 100%    Constitutional: NAD, calm  Eyes: PERTLA, lids and conjunctivae normal ENMT: Mucous membranes are moist. Posterior pharynx clear of any exudate or lesions.   Neck: supple, no masses  Respiratory: no wheezing, no crackles. No accessory muscle use.  Cardiovascular: S1 & S2 heard, regular rate and rhythm. No extremity edema.   Abdomen:  Soft, tender in lower quadrants, no guarding. Bowel sounds active.  Musculoskeletal: no clubbing / cyanosis. No joint deformity upper and lower extremities.   Skin: no significant rashes, lesions, ulcers. Warm, dry, well-perfused. Neurologic: CN 2-12 grossly intact. Moving all extremities. Alert and oriented.  Psychiatric: Calm. Cooperative.    Labs and Imaging on Admission: I have personally reviewed following labs and imaging studies  CBC: Recent Labs  Lab 01/28/24 0413 01/28/24 1137 01/29/24 0709 02/01/24 1500  WBC 17.2* 12.1* 7.5 14.0*  NEUTROABS 11.7*  --   --  12.5*  HGB 12.5 12.8 11.6* 15.8*  HCT 37.2 37.6 33.7* 48.7*  MCV 94.7 94.9 93.6 96.6  PLT 287 257 246 264   Basic Metabolic Panel: Recent Labs  Lab 01/28/24 0413 01/28/24 1137 01/28/24 1551 01/29/24 0709 02/01/24 1555  NA 141  --  137 137 140  K 3.3*  --  3.6 3.3* 3.5  CL 113*  --  108 106 112*  CO2 22  --  23 23 20*  GLUCOSE 36*  --  85 116* 39*  BUN 11  --  6 5* 11  CREATININE 0.81 0.77 0.73 0.69 0.67  CALCIUM 8.8*  --  8.7* 8.3* 8.9   GFR: Estimated Creatinine Clearance: 98.9 mL/min (by C-G formula based on SCr of 0.67 mg/dL). Liver Function Tests: Recent Labs  Lab 01/29/24 0709  AST 16  ALT 11  ALKPHOS 31*  BILITOT 0.6  PROT 4.8*  ALBUMIN  2.7*   No results for input(s): LIPASE, AMYLASE in the last 168 hours. No results for input(s): AMMONIA in the last 168 hours. Coagulation Profile: No results for input(s): INR, PROTIME in the last 168 hours. Cardiac Enzymes: No results for input(s): CKTOTAL, CKMB, CKMBINDEX, TROPONINI in the last 168 hours. BNP (last 3 results) No results for input(s): PROBNP in the last 8760 hours. HbA1C: No results for input(s): HGBA1C in the last 72 hours. CBG: Recent Labs  Lab 02/01/24 1915 02/01/24 2155 02/01/24 2318 02/02/24 0007 02/02/24 0053  GLUCAP 111* 102* 99 75 54*   Lipid Profile: No results for input(s): CHOL, HDL,  LDLCALC, TRIG, CHOLHDL, LDLDIRECT in the last 72 hours. Thyroid Function Tests: No results for input(s): TSH, T4TOTAL, FREET4, T3FREE, THYROIDAB in the last 72 hours. Anemia Panel: No results for input(s): VITAMINB12, FOLATE, FERRITIN, TIBC, IRON, RETICCTPCT in the last 72 hours. Urine analysis:    Component Value Date/Time   COLORURINE YELLOW 05/14/2023 0457   APPEARANCEUR CLOUDY (A) 05/14/2023 0457   LABSPEC 1.010 05/14/2023 0457   PHURINE 5.5 05/14/2023 0457   GLUCOSEU NEGATIVE 05/14/2023 0457   HGBUR TRACE (A) 05/14/2023 0457   BILIRUBINUR NEGATIVE 05/14/2023 0457   BILIRUBINUR negative 04/13/2018 1521   KETONESUR NEGATIVE 05/14/2023 0457   PROTEINUR NEGATIVE 05/14/2023 0457   UROBILINOGEN 0.2 05/23/2019 1646   NITRITE NEGATIVE 05/14/2023 0457  LEUKOCYTESUR NEGATIVE 05/14/2023 0457   Sepsis Labs: @LABRCNTIP (procalcitonin:4,lacticidven:4) ) Recent Results (from the past 240 hours)  Wet prep, genital     Status: Abnormal   Collection Time: 01/28/24 12:25 PM   Specimen: Vaginal  Result Value Ref Range Status   Yeast Wet Prep HPF POC PRESENT (A) NONE SEEN Final   Trich, Wet Prep NONE SEEN NONE SEEN Final   Clue Cells Wet Prep HPF POC NONE SEEN NONE SEEN Final   WBC, Wet Prep HPF POC <10 <10 Final   Sperm NONE SEEN  Final    Comment: Performed at [facility], [address], [place], [place] [address]  Urine Culture (for pregnant, neutropenic or urologic patients or patients with an indwelling urinary catheter)     Status: None   Collection Time: 01/29/24  9:36 AM   Specimen: Urine, Clean Catch  Result Value Ref Range Status   Specimen Description URINE, CLEAN CATCH  Final   Special Requests NONE  Final   Culture   Final    NO GROWTH Performed at [facility], [address], [place], [place] [address]    Report Status 01/30/2024 FINAL  Final     Radiological Exams on Admission: US  PELVIC COMPLETE W TRANSVAGINAL AND TORSION  R/O Result Date: 02/01/2024 CLINICAL DATA:  164010 PID (acute pelvic inflammatory disease) 164010. EXAM: TRANSABDOMINAL AND TRANSVAGINAL ULTRASOUND OF PELVIS DOPPLER ULTRASOUND OF OVARIES TECHNIQUE: Both transabdominal and transvaginal ultrasound examinations of the pelvis were performed. Transabdominal technique was performed for global imaging of the pelvis including uterus, ovaries, adnexal regions, and pelvic cul-de-sac. It was necessary to proceed with endovaginal exam following the transabdominal exam to visualize the bilateral ovaries. Color and duplex Doppler ultrasound was utilized to evaluate blood flow to the ovaries. COMPARISON:  CT scan abdomen and pelvis from 01/28/2024. FINDINGS: Uterus Measurements: 4.3 x 5.7 x 7.5 cm. = volume: 95.8 mL. There is a 3.4 x 3.7 x 3.9 cm intramural/subserosal leiomyoma in the right posterior uterine body. Endometrium Thickness: 8.7 mm.  No focal abnormality visualized. Right ovary Measurements: 3.1 x 3.3 x 4.6 cm = volume: 23.8 mL. Involuting/decreasing 2.1 x 2.8 x 3.1 cm hemorrhagic cyst noted in the right ovary. Left ovary Measurements: 2.8 x 3.4 x 5.0 cm = volume: 24.7 mL. Involuting/decreasing 1.5 x 2.3 cm hemorrhagic cyst noted in the left ovary. Pulsed Doppler evaluation of both ovaries demonstrates normal low-resistance arterial and venous waveforms. Other findings No abnormal free fluid. IMPRESSION: 1. Involuting/decreasing hemorrhagic cysts in the bilateral ovaries. No evidence of ovarian torsion. 2. Uterine leiomyoma. Electronically Signed   By: Beula Brunswick M.D.   On: 02/01/2024 18:06    Assessment/Plan   1. Hypoglycemia d/t insulin   - Recurrent hypoglycemia d/t taking insulin  despite being told not to take in any more  - A1c was 5.0% this month   - Monitor CBGs, encourage her to eat, reinforced the need to stop taking insulin    2. Seizure  - Due to hypoglycemia  - Treat hypoglycemia as above, monitor    3. Schizoaffective disorder  -  Outpatient follow-up advised   4. Chlammydia   - Prescribed doxycycline  by PCP and OBGYN, treated with 1 g azithromycin  in ED     DVT prophylaxis: Lovenox   Code Status: Full  Level of Care: Level of care: Progressive Family Communication: Significant other updated at patient's request   Disposition Plan:  Patient is from: home  Anticipated d/c is to: Home  Anticipated d/c date is:  02/03/24  Patient currently: Pending stable glucose  Consults called: None  Admission status: Observation     Walton Guppy, MD [facility]  02/02/2024, 12:56 AM

## 2024-02-02 NOTE — Progress Notes (Addendum)
  PROGRESS NOTE  Paula Lucas  DOB: [birth year]  PCP: Lawrance Presume, MD ZOX:[id number]  DOA: 02/01/2024  LOS: 0 days  Hospital Day: 2  Brief narrative: Paula Lucas is a 33 y.o. female with PMH significant for schizoaffective disorder, h/o PID who does not seem to have diabetes but for some reason continues to inject herself insulin  leading to hypoglycemic episode.  Recently hospitalized for the same 6/7 -6/8, was instructed to stop insulin . 6/11, it seems patient injected herself 60 units of insulin  and ended up having generalized seizure.  EMS noted blood sugar level of 39, give IV dextrose .  In the ED, patient was hemodynamically stable, postictal. Labs showed WBC count of 14, hemoglobin 15.8  Admitted to [facility].      Before I could see the patient this morning, patient left AMA.. Few minutes prior to that, I was called by nurse regarding her demand of IV pain medicines.  My instruction was to wait for next 30 minutes, to recheck blood pressure and give low-dose Dilaudid  if blood pressure allows.  Patient apparently did not want to wait and decided to leave AMA.  I tried to call her number and her partner's number to reiterate NOT to use insulin  again.  However, both the numbers listed in the chart are not workiing. I tried to call her mother's number as listed but it is a wrong number.

## 2024-02-02 NOTE — ED Notes (Signed)
 RN noticed BP to be low when giving report. BP was in 80's systolic. RN checked the chart and pt had multiple BP in 70 systolic. Previous RN stated she did not report low BP's to MD. At 0715 RN introduced herself to pt and while in room readjusted BP cuff. BP was 90 systolic, RN rechecked BP on different arm and it was 100 systolic. Pt calling on callbell every 5-10 minutes for pain medication. At [phone] this RN was at bedside and explained to pt that RN does not feel comfortable giving her IV pain medication due to hypotension. Pt started to curse at RN and asked for MD to come to bedside and for a different RN. This RN called Charge and notified MD. MD stated he will be at bedside before 9am, Charge stated we can accommodate for another RN at this time. This Rn was at nursing station and could hear pt calling RN stupid and that she does not know what the fuck she is talking about Security called to bedside. During this time partner at bedside was telling pt to shut the fuck up and slammed the door. Partner went to bathroom. When partner came out of bathroom security was at bedside and asked partner to leave due to verbal altercation. Partner stated he was not going to leave to secuirty. Security made it clear that he needs to leave and pt stated she was going to leave too. Pt stated staff can not touch her to get IV taken out.  Pt refused to let staff take IV out and left with IV in place. Charge made aware. Security walked pt and visitor out. MD notified pt left AMA.

## 2024-07-03 ENCOUNTER — Ambulatory Visit (HOSPITAL_COMMUNITY): Payer: Self-pay

## 2024-08-14 ENCOUNTER — Ambulatory Visit (HOSPITAL_COMMUNITY)
Admission: EM | Admit: 2024-08-14 | Discharge: 2024-08-14 | Disposition: A | Discharge status: Home / Self Care | Payer: MEDICAID | Attending: Emergency Medicine | Admitting: Emergency Medicine

## 2024-08-14 ENCOUNTER — Encounter (HOSPITAL_COMMUNITY): Payer: Self-pay | Admitting: *Deleted

## 2024-08-14 DIAGNOSIS — K047 Periapical abscess without sinus: Secondary | ICD-10-CM | POA: Diagnosis not present

## 2024-08-14 DIAGNOSIS — K0889 Other specified disorders of teeth and supporting structures: Secondary | ICD-10-CM

## 2024-08-14 MED ORDER — KETOROLAC TROMETHAMINE 30 MG/ML IJ SOLN
30.0000 mg | Freq: Once | INTRAMUSCULAR | Status: AC
  Administered 2024-08-14: 30 mg via INTRAMUSCULAR

## 2024-08-14 MED ORDER — KETOROLAC TROMETHAMINE 10 MG PO TABS: 10.0000 mg | ORAL_TABLET | Freq: Four times a day (QID) | ORAL | 0 refills | Status: AC | PRN

## 2024-08-14 MED ORDER — AMOXICILLIN-POT CLAVULANATE 875-125 MG PO TABS: 1.0000 | ORAL_TABLET | Freq: Two times a day (BID) | ORAL | 0 refills | Status: AC

## 2024-08-14 MED ORDER — KETOROLAC TROMETHAMINE 30 MG/ML IJ SOLN
INTRAMUSCULAR | Status: AC
  Filled 2024-08-14: qty 1

## 2024-08-14 NOTE — ED Triage Notes (Signed)
 Pt states she has left sided dental pain X 2 months. She has been waiting for a dentist appt. She has been taking advil without relief.

## 2024-08-14 NOTE — Discharge Instructions (Addendum)
 Start taking Augmentin  twice daily for 7 days for dental infection. You are given injection of Toradol  in clinic for your pain today.  Do not take any additional Toradol  for at least 8 hours after receiving this.  After this you can take it every 6 hours as needed.  Do not take this with other NSAIDs including ibuprofen, Advil, Motrin, Aleve, and naproxen. You can take 500 to 1000 mg of Tylenol  every 6-8 hours as needed for pain. Schedule an appointment with your dentist for further evaluation and management of your dental pain.

## 2024-08-14 NOTE — ED Provider Notes (Signed)
 " [facility]    CSN: [id number] Arrival date & time: 08/14/24  1750      History   Chief Complaint No chief complaint on file.   HPI Paula Lucas is a 33 y.o. female.   Patient presents with left-sided dental pain x 2 months.  Patient states that over the last 2 weeks she has had some swelling to the left side of her mouth and face as well.  Patient states that she called her dentist and they reported that they will be able to see her the 2nd or 3rd week of January.  Patient reports that she is been taking liquid Advil without relief.  Patient also reports that she has been taking this without any reaction.  Patient has documented allergy to ibuprofen with reaction being anaphylaxis, however patient has been taking liquid Advil without any reaction.  Per patient's chart she has also received ketorolac  in the past in 2019 and 2023 without any issues.  The history is provided by the patient and medical records.    Past Medical History:  Diagnosis Date   Asthma    BMI 31.0-31.9,adult    Chlamydia    Gonorrhea    TOA (tubo-ovarian abscess)     Patient Active Problem List   Diagnosis Date Noted   Hypoglycemia due to insulin  02/02/2024   Seizure due to hypoglycemia (HCC) 02/02/2024   Chlamydia infection 02/02/2024   Cysts of both ovaries 01/29/2024   Hypoglycemia 01/28/2024   Pain in pelvis 05/14/2023   Type 2 diabetes mellitus without complication, with long-term current use of insulin  (HCC) 05/14/2023   Suprapubic abscess 05/14/2023   Schizoaffective disorder, bipolar type (HCC)    Abdominal pain, left upper quadrant    Fever 03/14/2022   COVID-19 03/13/2022   Abdominal pain 03/13/2022   Abscess of abdominal cavity (HCC)    Hypokalemia 03/05/2022   Pleural effusion 03/05/2022   Acute blood loss anemia 03/05/2022   Debility 02/28/2022   Depression 02/23/2022   Protein-calorie malnutrition    Hx of Fitz-Hugh-Curtis syndrome    Hepatic abscess  02/15/2022   MRSA (methicillin resistant staph aureus) culture positive    Pelvic abscess      Past Surgical History:  Procedure Laterality Date   INDUCED ABORTION     IR RADIOLOGIST EVAL & MGMT  05/17/2018   LAPAROTOMY N/A 02/16/2022   Procedure: EXPLORATORY LAPAROTOMY WITH ABDOMINAL WASHOUT;  Surgeon: Fredirick Glenys GORMAN, MD;  Location: [facility] OR;  Service: Gynecology;  Laterality: N/A;   SMALL BOWEL REPAIR N/A 02/16/2022   Procedure: SMALL BOWEL REPAIR;  Surgeon: Sebastian Moles, MD;  Location: [facility] OR;  Service: General;  Laterality: N/A;   TONSILLECTOMY      OB History     Gravida  3   Para  0   Term  0   Preterm  0   AB  1   Living  0      SAB  0   IAB  1   Ectopic  0   Multiple  0   Live Births               Home Medications    Prior to Admission medications  Medication Sig Start Date End Date Taking? Authorizing Provider  amoxicillin -clavulanate (AUGMENTIN ) 875-125 MG tablet Take 1 tablet by mouth every 12 (twelve) hours. 08/14/24  Yes Johnie, Jarquavious Fentress A, NP  ketorolac  (TORADOL ) 10 MG tablet Take 1 tablet (10 mg total) by mouth every 6 (six)  hours as needed for moderate pain (pain score 4-6) or severe pain (pain score 7-10). 08/14/24  Yes Johnie Rumaldo LABOR, NP    Family History Family History  Problem Relation Age of Onset   Diabetes Mother    Hypertension Mother    Diabetes Father    Hypertension Father    Diabetes Sister    Hypertension Sister     Social History Social History[1]   Allergies   Fish allergy, Motrin [ibuprofen], Mushroom extract complex (obsolete), Shellfish protein-containing drug products, and Tylenol  [acetaminophen ]   Review of Systems Review of Systems  Per HPI  Physical Exam Triage Vital Signs ED Triage Vitals  Encounter Vitals Group     BP 08/14/24 1921 121/86     Girls Systolic BP Percentile --      Girls Diastolic BP Percentile --      Boys Systolic BP Percentile --      Boys Diastolic BP Percentile --       Pulse Rate 08/14/24 1921 95     Resp 08/14/24 1921 16     Temp 08/14/24 1921 98.6 F (37 C)     Temp Source 08/14/24 1921 Oral     SpO2 08/14/24 1921 97 %     Weight --      Height --      Head Circumference --      Peak Flow --      Pain Score 08/14/24 1919 8     Pain Loc --      Pain Education --      Exclude from Growth Chart --    No data found.  Updated Vital Signs BP 121/86 (BP Location: Right Arm)   Pulse 95   Temp 98.6 F (37 C) (Oral)   Resp 16   LMP 07/31/2024 (Exact Date)   SpO2 97%   Breastfeeding Unknown   Visual Acuity Right Eye Distance:   Left Eye Distance:   Bilateral Distance:    Right Eye Near:   Left Eye Near:    Bilateral Near:     Physical Exam Vitals and nursing note reviewed.  Constitutional:      General: She is awake. She is not in acute distress.    Appearance: Normal appearance. She is well-developed and well-groomed. She is not ill-appearing.  HENT:     Head:      Comments: Mild swelling noted over left side of face    Mouth/Throat:     Dentition: Abnormal dentition. Dental tenderness, gingival swelling and dental caries present.     Comments: Dental tenderness, missing teeth, dental caries, and gingival swelling noted to left upper mouth Skin:    General: Skin is warm and dry.  Neurological:     Mental Status: She is alert.  Psychiatric:        Behavior: Behavior is cooperative.      UC Treatments / Results  Labs (all labs ordered are listed, but only abnormal results are displayed) Labs Reviewed - No data to display  EKG   Radiology No results found.  Procedures Procedures (including critical care time)  Medications Ordered in UC Medications  ketorolac  (TORADOL ) 30 MG/ML injection 30 mg (has no administration in time range)    Initial Impression / Assessment and Plan / UC Course  I have reviewed the triage vital signs and the nursing notes.  Pertinent labs & imaging results that were available during my  care of the patient were reviewed by me and considered in  my medical decision making (see chart for details).     Patient is overall well-appearing.  Vitals are stable.  Given Toradol  in clinic for acute pain.  Prescribed Toradol  for additional pain relief if needed.  Recommended alternating this with Tylenol  as needed.  Patient did not report any adverse reaction to Tylenol  when I suggested this.  Prescribed Augmentin  for dental infection coverage.  Discussed importance of following up with dentist for further evaluation.  Discussed follow-up and return precautions. Final Clinical Impressions(s) / UC Diagnoses   Final diagnoses:  Dental infection  Pain, dental     Discharge Instructions      Start taking Augmentin  twice daily for 7 days for dental infection. You are given injection of Toradol  in clinic for your pain today.  Do not take any additional Toradol  for at least 8 hours after receiving this.  After this you can take it every 6 hours as needed.  Do not take this with other NSAIDs including ibuprofen, Advil, Motrin, Aleve, and naproxen. You can take 500 to 1000 mg of Tylenol  every 6-8 hours as needed for pain. Schedule an appointment with your dentist for further evaluation and management of your dental pain.   ED Prescriptions     Medication Sig Dispense Auth. Provider   ketorolac  (TORADOL ) 10 MG tablet Take 1 tablet (10 mg total) by mouth every 6 (six) hours as needed for moderate pain (pain score 4-6) or severe pain (pain score 7-10). 20 tablet Johnie Flaming A, NP   amoxicillin -clavulanate (AUGMENTIN ) 875-125 MG tablet Take 1 tablet by mouth every 12 (twelve) hours. 14 tablet Johnie Flaming A, NP      PDMP not reviewed this encounter.    [1]  Social History Tobacco Use   Smoking status: Former    Current packs/day: 0.50    Average packs/day: 0.5 packs/day for 9.0 years (4.5 ttl pk-yrs)    Types: Cigarettes   Smokeless tobacco: Never  Substance Use Topics    Alcohol  use: No   Drug use: Not Currently    Types: Marijuana    Comment: THC GUMMIES LAST MONTH     Johnie Flaming LABOR, NP 08/14/24 1956  "

## 2024-09-20 ENCOUNTER — Ambulatory Visit: Payer: MEDICAID | Admitting: Podiatry
